# Patient Record
Sex: Female | Born: 1955
Health system: Southern US, Community
[De-identification: ages and names within clinical notes are randomized; demographics above are authoritative.]

## PROBLEM LIST (undated history)

## (undated) DIAGNOSIS — Z8619 Personal history of other infectious and parasitic diseases: Secondary | ICD-10-CM

## (undated) DIAGNOSIS — J45909 Unspecified asthma, uncomplicated: Secondary | ICD-10-CM

## (undated) DIAGNOSIS — L97519 Non-pressure chronic ulcer of other part of right foot with unspecified severity: Secondary | ICD-10-CM

## (undated) DIAGNOSIS — E1140 Type 2 diabetes mellitus with diabetic neuropathy, unspecified: Secondary | ICD-10-CM

## (undated) DIAGNOSIS — F1911 Other psychoactive substance abuse, in remission: Secondary | ICD-10-CM

## (undated) DIAGNOSIS — F32A Depression, unspecified: Secondary | ICD-10-CM

## (undated) DIAGNOSIS — N3941 Urge incontinence: Secondary | ICD-10-CM

## (undated) DIAGNOSIS — I10 Essential (primary) hypertension: Secondary | ICD-10-CM

## (undated) DIAGNOSIS — Z8709 Personal history of other diseases of the respiratory system: Secondary | ICD-10-CM

## (undated) DIAGNOSIS — Z9109 Other allergy status, other than to drugs and biological substances: Secondary | ICD-10-CM

## (undated) DIAGNOSIS — R600 Localized edema: Secondary | ICD-10-CM

## (undated) DIAGNOSIS — E119 Type 2 diabetes mellitus without complications: Secondary | ICD-10-CM

## (undated) DIAGNOSIS — R350 Frequency of micturition: Secondary | ICD-10-CM

## (undated) DIAGNOSIS — M779 Enthesopathy, unspecified: Secondary | ICD-10-CM

## (undated) DIAGNOSIS — F419 Anxiety disorder, unspecified: Secondary | ICD-10-CM

## (undated) DIAGNOSIS — F329 Major depressive disorder, single episode, unspecified: Secondary | ICD-10-CM

## (undated) DIAGNOSIS — B182 Chronic viral hepatitis C: Secondary | ICD-10-CM

## (undated) DIAGNOSIS — M797 Fibromyalgia: Secondary | ICD-10-CM

## (undated) DIAGNOSIS — J309 Allergic rhinitis, unspecified: Secondary | ICD-10-CM

## (undated) DIAGNOSIS — N183 Chronic kidney disease, stage 3 unspecified: Secondary | ICD-10-CM

## (undated) DIAGNOSIS — G8929 Other chronic pain: Secondary | ICD-10-CM

## (undated) DIAGNOSIS — I739 Peripheral vascular disease, unspecified: Secondary | ICD-10-CM

## (undated) DIAGNOSIS — M1990 Unspecified osteoarthritis, unspecified site: Secondary | ICD-10-CM

## (undated) DIAGNOSIS — M549 Dorsalgia, unspecified: Secondary | ICD-10-CM

## (undated) DIAGNOSIS — I2510 Atherosclerotic heart disease of native coronary artery without angina pectoris: Secondary | ICD-10-CM

## (undated) DIAGNOSIS — R351 Nocturia: Secondary | ICD-10-CM

## (undated) DIAGNOSIS — F1120 Opioid dependence, uncomplicated: Secondary | ICD-10-CM

## (undated) HISTORY — DX: Chronic viral hepatitis C: B18.2

---
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
## 2005-05-27 ENCOUNTER — Ambulatory Visit: Payer: Self-pay | Admitting: Internal Medicine

## 2005-05-28 ENCOUNTER — Ambulatory Visit: Payer: Self-pay | Admitting: *Deleted

## 2005-07-10 ENCOUNTER — Ambulatory Visit: Payer: Self-pay | Admitting: Internal Medicine

## 2005-10-09 ENCOUNTER — Emergency Department (HOSPITAL_COMMUNITY): Admission: EM | Admit: 2005-10-09 | Discharge: 2005-10-09 | Payer: Self-pay | Admitting: Emergency Medicine

## 2005-10-15 ENCOUNTER — Emergency Department (HOSPITAL_COMMUNITY): Admission: EM | Admit: 2005-10-15 | Discharge: 2005-10-15 | Payer: Self-pay | Admitting: Emergency Medicine

## 2006-08-11 ENCOUNTER — Ambulatory Visit: Payer: Self-pay | Admitting: Internal Medicine

## 2006-09-11 ENCOUNTER — Ambulatory Visit: Payer: Self-pay | Admitting: Internal Medicine

## 2006-12-16 ENCOUNTER — Encounter (INDEPENDENT_AMBULATORY_CARE_PROVIDER_SITE_OTHER): Payer: Self-pay | Admitting: *Deleted

## 2007-06-30 ENCOUNTER — Ambulatory Visit: Payer: Self-pay | Admitting: Internal Medicine

## 2007-09-09 ENCOUNTER — Ambulatory Visit: Payer: Self-pay | Admitting: Internal Medicine

## 2007-09-09 LAB — CONVERTED CEMR LAB
ALT: 18 units/L (ref 0–35)
AST: 28 units/L (ref 0–37)
Albumin: 3.8 g/dL (ref 3.5–5.2)
Alkaline Phosphatase: 68 units/L (ref 39–117)
BUN: 11 mg/dL (ref 6–23)
Basophils Absolute: 0 10*3/uL (ref 0.0–0.1)
Basophils Relative: 0 % (ref 0–1)
CO2: 28 meq/L (ref 19–32)
Calcium: 9.5 mg/dL (ref 8.4–10.5)
Chloride: 98 meq/L (ref 96–112)
Creatinine, Ser: 0.98 mg/dL (ref 0.40–1.20)
Eosinophils Absolute: 0.2 10*3/uL (ref 0.0–0.7)
Eosinophils Relative: 1 % (ref 0–5)
Glucose, Bld: 149 mg/dL — ABNORMAL HIGH (ref 70–99)
HCT: 41.7 % (ref 36.0–46.0)
HCV Ab: REACTIVE — AB
Hemoglobin: 13.3 g/dL (ref 12.0–15.0)
Hep A Total Ab: NEGATIVE
Hep B Core Total Ab: POSITIVE — AB
Hep B E Ab: POSITIVE — AB
Hep B S Ab: NEGATIVE
Hepatitis B Surface Ag: NEGATIVE
Lymphocytes Relative: 39 % (ref 12–46)
Lymphs Abs: 4.2 10*3/uL — ABNORMAL HIGH (ref 0.7–4.0)
MCHC: 31.9 g/dL (ref 30.0–36.0)
MCV: 85.1 fL (ref 78.0–100.0)
Monocytes Absolute: 0.7 10*3/uL (ref 0.1–1.0)
Monocytes Relative: 6 % (ref 3–12)
Neutro Abs: 5.8 10*3/uL (ref 1.7–7.7)
Neutrophils Relative %: 53 % (ref 43–77)
Platelets: 225 10*3/uL (ref 150–400)
Potassium: 4.1 meq/L (ref 3.5–5.3)
RBC: 4.9 M/uL (ref 3.87–5.11)
RDW: 12.6 % (ref 11.5–15.5)
Sodium: 138 meq/L (ref 135–145)
TSH: 2.169 microintl units/mL (ref 0.350–5.50)
Total Bilirubin: 0.6 mg/dL (ref 0.3–1.2)
Total Protein: 8.1 g/dL (ref 6.0–8.3)
WBC: 10.9 10*3/uL — ABNORMAL HIGH (ref 4.0–10.5)

## 2007-09-10 ENCOUNTER — Ambulatory Visit (HOSPITAL_COMMUNITY): Admission: RE | Admit: 2007-09-10 | Discharge: 2007-09-10 | Payer: Self-pay | Admitting: Internal Medicine

## 2008-02-23 ENCOUNTER — Ambulatory Visit: Payer: Self-pay | Admitting: Internal Medicine

## 2008-03-10 ENCOUNTER — Ambulatory Visit: Payer: Self-pay | Admitting: Internal Medicine

## 2008-03-16 ENCOUNTER — Ambulatory Visit: Payer: Self-pay | Admitting: Internal Medicine

## 2008-03-21 ENCOUNTER — Emergency Department (HOSPITAL_COMMUNITY): Admission: EM | Admit: 2008-03-21 | Discharge: 2008-03-21 | Payer: Self-pay | Admitting: Emergency Medicine

## 2008-04-04 ENCOUNTER — Encounter (HOSPITAL_BASED_OUTPATIENT_CLINIC_OR_DEPARTMENT_OTHER): Admission: RE | Admit: 2008-04-04 | Discharge: 2008-07-03 | Payer: Self-pay | Admitting: Internal Medicine

## 2008-04-05 ENCOUNTER — Encounter (HOSPITAL_BASED_OUTPATIENT_CLINIC_OR_DEPARTMENT_OTHER): Payer: Self-pay | Admitting: Internal Medicine

## 2008-04-05 ENCOUNTER — Ambulatory Visit: Admission: RE | Admit: 2008-04-05 | Discharge: 2008-04-05 | Payer: Self-pay | Admitting: Internal Medicine

## 2008-04-05 ENCOUNTER — Ambulatory Visit: Payer: Self-pay | Admitting: Vascular Surgery

## 2008-04-06 ENCOUNTER — Ambulatory Visit: Payer: Self-pay | Admitting: Internal Medicine

## 2008-06-22 ENCOUNTER — Ambulatory Visit: Payer: Self-pay | Admitting: Internal Medicine

## 2008-06-28 ENCOUNTER — Encounter (INDEPENDENT_AMBULATORY_CARE_PROVIDER_SITE_OTHER): Payer: Self-pay | Admitting: *Deleted

## 2008-07-13 ENCOUNTER — Encounter (HOSPITAL_BASED_OUTPATIENT_CLINIC_OR_DEPARTMENT_OTHER): Admission: RE | Admit: 2008-07-13 | Discharge: 2008-08-01 | Payer: Self-pay | Admitting: Internal Medicine

## 2008-07-27 ENCOUNTER — Ambulatory Visit: Payer: Self-pay | Admitting: Internal Medicine

## 2008-08-03 ENCOUNTER — Ambulatory Visit (HOSPITAL_COMMUNITY): Admission: RE | Admit: 2008-08-03 | Discharge: 2008-08-03 | Payer: Self-pay | Admitting: Family Medicine

## 2008-09-28 ENCOUNTER — Ambulatory Visit: Payer: Self-pay | Admitting: Internal Medicine

## 2008-09-28 ENCOUNTER — Encounter: Payer: Self-pay | Admitting: Internal Medicine

## 2008-10-19 ENCOUNTER — Encounter (HOSPITAL_BASED_OUTPATIENT_CLINIC_OR_DEPARTMENT_OTHER): Admission: RE | Admit: 2008-10-19 | Discharge: 2009-01-17 | Payer: Self-pay | Admitting: Internal Medicine

## 2008-10-30 ENCOUNTER — Encounter (HOSPITAL_BASED_OUTPATIENT_CLINIC_OR_DEPARTMENT_OTHER): Payer: Self-pay | Admitting: General Surgery

## 2008-10-30 ENCOUNTER — Ambulatory Visit: Payer: Self-pay | Admitting: Vascular Surgery

## 2008-10-30 ENCOUNTER — Ambulatory Visit: Admission: RE | Admit: 2008-10-30 | Discharge: 2008-10-30 | Payer: Self-pay | Admitting: General Surgery

## 2008-11-28 ENCOUNTER — Ambulatory Visit: Payer: Self-pay | Admitting: Internal Medicine

## 2008-11-28 LAB — CONVERTED CEMR LAB: Microalb, Ur: 0.5 mg/dL (ref 0.00–1.89)

## 2008-12-08 ENCOUNTER — Ambulatory Visit (HOSPITAL_COMMUNITY): Admission: RE | Admit: 2008-12-08 | Discharge: 2008-12-08 | Payer: Self-pay | Admitting: Internal Medicine

## 2008-12-26 ENCOUNTER — Ambulatory Visit: Payer: Self-pay | Admitting: Internal Medicine

## 2009-01-24 ENCOUNTER — Encounter (HOSPITAL_BASED_OUTPATIENT_CLINIC_OR_DEPARTMENT_OTHER): Admission: RE | Admit: 2009-01-24 | Discharge: 2009-04-02 | Payer: Self-pay | Admitting: Internal Medicine

## 2009-03-14 ENCOUNTER — Ambulatory Visit: Payer: Self-pay | Admitting: Internal Medicine

## 2009-04-02 ENCOUNTER — Ambulatory Visit: Payer: Self-pay | Admitting: Internal Medicine

## 2009-04-02 LAB — CONVERTED CEMR LAB
BUN: 14 mg/dL (ref 6–23)
CO2: 22 meq/L (ref 19–32)
Calcium: 8.4 mg/dL (ref 8.4–10.5)
Chloride: 98 meq/L (ref 96–112)
Creatinine, Ser: 0.94 mg/dL (ref 0.40–1.20)
Glucose, Bld: 174 mg/dL — ABNORMAL HIGH (ref 70–99)
Potassium: 3.5 meq/L (ref 3.5–5.3)
Sodium: 134 meq/L — ABNORMAL LOW (ref 135–145)

## 2009-04-03 ENCOUNTER — Encounter (HOSPITAL_BASED_OUTPATIENT_CLINIC_OR_DEPARTMENT_OTHER): Admission: RE | Admit: 2009-04-03 | Discharge: 2009-06-18 | Payer: Self-pay | Admitting: Internal Medicine

## 2009-05-11 ENCOUNTER — Ambulatory Visit: Payer: Self-pay | Admitting: Internal Medicine

## 2009-07-16 ENCOUNTER — Encounter (HOSPITAL_BASED_OUTPATIENT_CLINIC_OR_DEPARTMENT_OTHER): Admission: RE | Admit: 2009-07-16 | Discharge: 2009-08-22 | Payer: Self-pay | Admitting: Internal Medicine

## 2009-07-24 ENCOUNTER — Ambulatory Visit: Payer: Self-pay | Admitting: Internal Medicine

## 2009-11-02 ENCOUNTER — Ambulatory Visit: Payer: Self-pay | Admitting: Internal Medicine

## 2009-12-06 ENCOUNTER — Encounter (INDEPENDENT_AMBULATORY_CARE_PROVIDER_SITE_OTHER): Payer: Self-pay | Admitting: *Deleted

## 2010-02-22 ENCOUNTER — Emergency Department (HOSPITAL_COMMUNITY): Admission: EM | Admit: 2010-02-22 | Discharge: 2010-02-22 | Payer: Self-pay | Admitting: Emergency Medicine

## 2010-03-08 ENCOUNTER — Ambulatory Visit (HOSPITAL_COMMUNITY): Admission: RE | Admit: 2010-03-08 | Discharge: 2010-03-08 | Payer: Self-pay | Source: Home / Self Care

## 2010-04-23 ENCOUNTER — Ambulatory Visit (HOSPITAL_COMMUNITY)
Admission: RE | Admit: 2010-04-23 | Discharge: 2010-04-23 | Payer: Medicare Other | Source: Home / Self Care | Attending: Internal Medicine | Admitting: Internal Medicine

## 2010-04-30 NOTE — Letter (Signed)
Summary: Generic Letter  [facility]  [address] [place], [place] [address]   Phone: [phone]  Fax: [phone]        12/06/2009  To: SSI  Re: Rose [name] Pro is a current patient at [facility]. She is stable and able to manage her own benefit payment.   Sincerely,   Adair Patter

## 2010-04-30 NOTE — Letter (Signed)
Summary: Generic Letter  [facility]  [address] [place], [place] [address]   Phone: [phone]  Fax: [phone]    12/06/2009  [facility] *INCORRECT [address] [place], [place]  [address]  Dear Ms. Redinger,           Sincerely,   Gaylyn Cheers RN

## 2010-04-30 NOTE — Miscellaneous (Signed)
Summary: VIP  Patient: Rose [name] Note: All result statuses are Final unless otherwise noted.  Tests: (1) VIP (Medications)   LLIMPORTMEDS              "Result Below..."       RESULT: VENTOLIN HFA AERS 108 (90 Base) MCG/ACT*USE 2 PUFFS EVERY 4-6 HOURS AS NEEDED*08/11/2006*Last Refill: ZOXWRUE*[id number]*******   LLIMPORTMEDS              "Result Below..."       RESULT: HYDROCHLOROTHIAZIDE TABS 25 MG*TAKE ONE (1) TABLET BY MOUTH EVERY DAY*11/09/2006*Last Refill: 12/31/2006*[id number]*******   LLIMPORTMEDS              "Result Below..."       RESULT: GLUCOTROL XL TB24 5 MG*TAKE ONE (1) TABLET BY MOUTH EVERY DAY*11/09/2006*Last Refill: 12/31/2006*[id number]*******   LLIMPORTMEDS              "Result Below..."       RESULT: ALLEGRA TABS 180 MG*TAKE ONE (1) TABLET BY MOUTH EVERY DAY*11/09/2006*Last Refill: 12/31/2006*[id number]*******   LLIMPORTMEDS              "Result Below..."       RESULT: ADVAIR DISKUS MISC 100-50 MCG/DOSE*INHALE ONE (1) PUFF TWICE DAILY*01/26/2007*Last Refill: Unknown*[id number]*******   LLIMPORTALLS              NKDA***  Note: An exclamation mark (!) indicates a result that was not dispersed into the flowsheet. Document Creation Date: 01/28/2007 3:02 PM _______________________________________________________________________  (1) Order result status: Final Collection or observation date-time: 12/16/2006 Requested date-time: 12/16/2006 Receipt date-time:  Reported date-time: 12/16/2006 Referring Physician:   Ordering Physician:   Specimen Source:  Source: [facility] [place] Order Number:  Lab site:

## 2010-05-09 ENCOUNTER — Encounter (INDEPENDENT_AMBULATORY_CARE_PROVIDER_SITE_OTHER): Payer: Self-pay | Admitting: Family Medicine

## 2010-05-09 LAB — CONVERTED CEMR LAB
ALT: 19 units/L (ref 0–35)
AST: 27 units/L (ref 0–37)
Albumin: 4.2 g/dL (ref 3.5–5.2)
Alkaline Phosphatase: 78 units/L (ref 39–117)
BUN: 14 mg/dL (ref 6–23)
CO2: 28 meq/L (ref 19–32)
Calcium: 9 mg/dL (ref 8.4–10.5)
Chloride: 99 meq/L (ref 96–112)
Creatinine, Ser: 1.01 mg/dL (ref 0.40–1.20)
Glucose, Bld: 91 mg/dL (ref 70–99)
Potassium: 4.1 meq/L (ref 3.5–5.3)
Sodium: 137 meq/L (ref 135–145)
Total Bilirubin: 0.6 mg/dL (ref 0.3–1.2)
Total Protein: 7.8 g/dL (ref 6.0–8.3)

## 2010-06-27 ENCOUNTER — Other Ambulatory Visit: Payer: Self-pay | Admitting: Podiatry

## 2010-06-28 ENCOUNTER — Emergency Department (HOSPITAL_COMMUNITY)
Admission: EM | Admit: 2010-06-28 | Discharge: 2010-06-28 | Disposition: A | Discharge status: Home / Self Care | Payer: Medicare Other | Attending: Emergency Medicine | Admitting: Emergency Medicine

## 2010-06-28 DIAGNOSIS — Z794 Long term (current) use of insulin: Secondary | ICD-10-CM | POA: Insufficient documentation

## 2010-06-28 DIAGNOSIS — E119 Type 2 diabetes mellitus without complications: Secondary | ICD-10-CM | POA: Insufficient documentation

## 2010-06-28 DIAGNOSIS — Z79899 Other long term (current) drug therapy: Secondary | ICD-10-CM | POA: Insufficient documentation

## 2010-06-28 DIAGNOSIS — Z7982 Long term (current) use of aspirin: Secondary | ICD-10-CM | POA: Insufficient documentation

## 2010-06-28 DIAGNOSIS — E669 Obesity, unspecified: Secondary | ICD-10-CM | POA: Insufficient documentation

## 2010-06-28 DIAGNOSIS — I10 Essential (primary) hypertension: Secondary | ICD-10-CM | POA: Insufficient documentation

## 2010-06-28 DIAGNOSIS — R259 Unspecified abnormal involuntary movements: Secondary | ICD-10-CM | POA: Insufficient documentation

## 2010-06-28 LAB — URINALYSIS, ROUTINE W REFLEX MICROSCOPIC
Bilirubin Urine: NEGATIVE
Glucose, UA: 100 mg/dL — AB
Hgb urine dipstick: NEGATIVE
Ketones, ur: NEGATIVE mg/dL
Nitrite: NEGATIVE
Protein, ur: NEGATIVE mg/dL
Specific Gravity, Urine: 1.01 (ref 1.005–1.030)
Urobilinogen, UA: 1 mg/dL (ref 0.0–1.0)
pH: 5.5 (ref 5.0–8.0)

## 2010-06-28 LAB — POCT I-STAT, CHEM 8
BUN: 26 mg/dL — ABNORMAL HIGH (ref 6–23)
Calcium, Ion: 1.04 mmol/L — ABNORMAL LOW (ref 1.12–1.32)
Chloride: 95 mEq/L — ABNORMAL LOW (ref 96–112)
Creatinine, Ser: 1.3 mg/dL — ABNORMAL HIGH (ref 0.4–1.2)
Glucose, Bld: 262 mg/dL — ABNORMAL HIGH (ref 70–99)
HCT: 42 % (ref 36.0–46.0)
Hemoglobin: 14.3 g/dL (ref 12.0–15.0)
Potassium: 3.8 mEq/L (ref 3.5–5.1)
Sodium: 130 mEq/L — ABNORMAL LOW (ref 135–145)
TCO2: 28 mmol/L (ref 0–100)

## 2010-06-28 LAB — COMPREHENSIVE METABOLIC PANEL
ALT: 19 U/L (ref 0–35)
AST: 27 U/L (ref 0–37)
Albumin: 3.3 g/dL — ABNORMAL LOW (ref 3.5–5.2)
Alkaline Phosphatase: 69 U/L (ref 39–117)
BUN: 20 mg/dL (ref 6–23)
CO2: 28 mEq/L (ref 19–32)
Calcium: 9.1 mg/dL (ref 8.4–10.5)
Chloride: 95 mEq/L — ABNORMAL LOW (ref 96–112)
Creatinine, Ser: 1.16 mg/dL (ref 0.4–1.2)
GFR calc Af Amer: 59 mL/min — ABNORMAL LOW (ref >60)
GFR calc non Af Amer: 49 mL/min — ABNORMAL LOW (ref >60)
Glucose, Bld: 240 mg/dL — ABNORMAL HIGH (ref 70–99)
Potassium: 4 mEq/L (ref 3.5–5.1)
Sodium: 132 mEq/L — ABNORMAL LOW (ref 135–145)
Total Bilirubin: 0.4 mg/dL (ref 0.3–1.2)
Total Protein: 7.6 g/dL (ref 6.0–8.3)

## 2010-08-13 NOTE — Assessment & Plan Note (Signed)
[facility]   Rose [name], Rose [name]             ACCOUNT NO.:  [id number]   MEDICAL RECORD NO.:  [id number]      DATE OF BIRTH:  December 03, 1955   PHYSICIAN:  Leonie Man, M.D.    VISIT DATE:  10/23/2008                                   OFFICE VISIT   This is a repeat wound care consultation.  The patient is a 55 year old  black female with a history of diabetes mellitus.  She attends the  [facility].  She was last seen here at [facility]  in April of 2010 when she was felt to be healed from her bilateral  lateral leg ulcers.  The patient was discharged on compression sock,  stockings for what we felt to be venous stasis disease.  She returns  today for a new consultation and evaluation with bilateral leg ulcers in  the bed of her previous ulcers.  The patient says that she has been  wearing her compression stockings over this period of time.  However,  her legs bilaterally continue to be swollen.  The patient does admit to  smoking approximately one half pack of cigarettes a day.   PAST MEDICAL HISTORY:  Appendectomy in 1992.   CURRENT MEDICATIONS:  Unchanged from her last fillings and these include  hydrochlorothiazide 25 mg daily, Glucotrol, Skelaxin, Advair Diskus,  aspirin 81 mg, furosemide 20 mg daily, metformin 500 mg daily, Paxil,  Ventolin, and Xyzal orally.   ALLERGIES:  The patient denies any medication allergies.   PAST MEDICAL HISTORY:  Diabetes mellitus type 2, chronic bronchitis with  COPD.   REVIEW OF SYSTEMS:  Negative in detail except as outlined above.   PHYSICAL EXAMINATION:  VITAL SIGNS:  The patient's temperature is 98.4,  pulse 81, respirations 19, and blood pressure 121/72.  Capillary blood  glucose is 92 on today's evaluation.  HEENT:  Poor dentition with multiple missing molars.  Oropharynx  otherwise shows moist mucosa.  NECK:  Supple.  No thyromegaly or adenopathy.  There is no scleral  icterus.  CHEST:   Coarse breath sounds consistent with a chronic bronchitis.  HEART:  Regular rate and rhythm.  There no murmurs or gallop rhythms  heard.  ABDOMEN:  Protuberant, obese, nontender, and nondistended without  palpable masses or visceromegaly.  EXTREMITIES:  Bilateral leg edema with bilateral lateral leg stasis  ulcers on the posterior portion of the right lower extremity and also  measuring 7.0 x 2.5 x 0.2 and on the left lower extremity  posterolaterally there is 3.8 x 1.6 x 0.2 ulcer.  There is no obvious  drainage.  There is no odor.   TREATMENT PLAN:  Cultures followed by selective debridement of both  ulcers, Santyl damp to dry dressings, and Kerlix and Coban with these  dressing to be changed every 3 days to the assistance of the Visiting  Nurse Service.   I have asked the patient to stop smoking which she promises to do.  We  will go ahead and order a new laboratory panel on her to include CBC,  sed rate, complete metabolic panel, hemoglobin A1c, and prealbumin.  Because her  pulses were nonpalpable, we will go ahead and get transcutaneous pO2s  and proximity to the wounds with  additional reference lead on the chest.  We will also get arterial and venous Doppler studies bilaterally.   We will follow up with this patient in 1 week.      Leonie Man, M.D.  Electronically Signed     PB/MEDQ  D:  10/23/2008  T:  10/24/2008  Job:  [id number]

## 2010-08-13 NOTE — Assessment & Plan Note (Signed)
[facility]   NAMECARLICIA, LEAVENS             ACCOUNT NO.:  [id number]   MEDICAL RECORD NO.:  [id number]      DATE OF BIRTH:  04-10-1955   PHYSICIAN:  Leonie Man, M.D.    VISIT DATE:  10/30/2008                                   OFFICE VISIT   PROBLEM:  Recurrent venous stasis disease.   SUBJ  55 year old female last seen on October 23, 2008, and at that time, treated  with Santyl wet-to-dry dressings with Kerlix and Coban over rather  necrotic ulcers.  The dressings were changed every other day and the  patient returns now for re-evaluation.   PHYSICAL EXAMINATION:  Temperature 98.1, pulse 71, respirations 16,  blood pressure 118/76.  Capillary blood glucose is 93.  The periwound tissues appear quite normal without any erythema or edema.  There is no odor or drainage.  The wound edges are clean and the wound  base is clean and granulating except for small areas of necrotic debris.   ASSESSMENT:  Improved healing of venous leg ulcers in this patient with  venous hypertension of the extremities bilaterally.   TREATMENT:  Today, we will continue her on Santyl on those areas of  necrosis with wet-to-dry dressings on the granulating surfaces and  Kerlix and Coban dressing changes every other day.   DISP  Follow up will be in 1 week.      Leonie Man, M.D.  Electronically Signed     PB/MEDQ  D:  10/30/2008  T:  10/31/2008  Job:  [id number]

## 2010-08-13 NOTE — Assessment & Plan Note (Signed)
[facility]   NAMEMAGON, CROSON             ACCOUNT NO.:  [id number]   MEDICAL RECORD NO.:  [id number]      DATE OF BIRTH:  01/15/1956   PHYSICIAN:  Leonie Man, M.D.         VISIT DATE:                                   OFFICE VISIT   PROBLEM:  Bilateral venous stasis disease.   HISTORY:  The patient is a 55 year old female with stasis ulcerations on  the lateral aspects of both legs, located midway up the calf.  She has  been treated with Profore wraps with Prisma and dry pads weekly.   On examination today, the current wound on the right leg is 3 x 3.1 x  0.1.  The left leg wound is not completely healed, not fully  epithelialized.  There is no odor to the wound.  There is some moderate  drainage.   The patient is feeling well without any complaints with respect to her  legs.  Her edema is now down completely.  Her capillary return is  excellent, and pulses are easily palpable.   VITAL SIGNS:  Today, temperature 98.3, pulse 80, respirations 16, and  blood pressure 133/82.  Her most recent CBG taken today was 93.   TREATMENT:  The right leg wound is selectively debrided using a knife  debridement.  This is then covered with a Prisma dry pad and a Profore  Lite and also applied a Profore Lite to the left lower extremity, and we  will see her back again in 1 week.      Leonie Man, M.D.  Electronically Signed     PB/MEDQ  D:  05/24/2008  T:  05/24/2008  Job:  [id number]

## 2010-08-13 NOTE — Assessment & Plan Note (Signed)
[facility]   Rose [name], Rose [name]             ACCOUNT NO.:  [id number]   MEDICAL RECORD NO.:  [id number]      DATE OF BIRTH:  1955/06/25   PHYSICIAN:  Leonie Man, M.D.    VISIT DATE:  05/10/2008                                   OFFICE VISIT   PROBLEM:  Bilateral venous stasis disease, currently doing well with the  wounds closing as expected on her current therapy of Prisma and Profore  wraps.   HISTORY:  The patient is a 55 year old woman with a past history of drug  abuse, currently on a methadone program.  I got a call from her nurse in  program today.  She informed me that the patient has been injecting  cocaine into her legs and under her tongue.  There was some significant  concern about this because the patient had a history of DVT in the past.  On talking with the patient, the patient had not been on any  anticoagulant therapy for her DVT that she can recall.   On examination of her legs, the edema is down.  The left leg is now  fully epithelialized and would just simply need some continued  compression over the next week or so.  On the right side, the wound is  fully granulated with some increasing epithelialization.  The current  wound measures 3.5 x 1.8 x 0.2 cm.  There is very slight amount of  fibrinous exudate.  This is selectively debrided away using a scalpel.  There is no drainage or odor.  There is no surrounding erythema.  There  is no undermining.   Today's treatment, the left leg will be wrapped in a Profore wrap.  The  right leg will have a Prisma placed on it to speed up epithelialization  and cover with Profore wrap.  I had a fairly long talk with the patient  asking her to be careful and not to inject any substances in her leg as  this may cause an infection and recurrent DVT.  I have asked her to go  ahead and make arrangements as per the request of her nurse to going to  inpatient rehabilitation center, so as to try to  modify her behavior.  I  do not think that she would be ready to have this to go in for another 2-  3 weeks until her legs healed up, but she is giving me a promise that  she will stop injecting drugs into her legs and under her tongue.   Follow up with her will be in 1 week.      Leonie Man, M.D.  Electronically Signed     PB/MEDQ  D:  05/10/2008  T:  05/11/2008  Job:  [id number]

## 2010-08-13 NOTE — Assessment & Plan Note (Signed)
[facility]   Rose [name], Rose [name]             ACCOUNT NO.:  [id number]   MEDICAL RECORD NO.:  [id number]      DATE OF BIRTH:  Jan 09, 1956   PHYSICIAN:  Jonelle Sports. Sevier, M.D.  VISIT DATE:  05/31/2008                                   OFFICE VISIT   HISTORY:  This 55 year old black female is followed for bilateral stasis  ulcerations of the lower extremities, occurring on the lateral aspects  of the leg.  That on the left had previously healed, and while she was  attempting to obtain compression hose, we have kept that extremity in a  Profore Lite wrap in hopes of preventing recurrence.   She persists with ulceration on the lateral right lower extremity, but  this is making progressive improvement.  She reports no problems with  her wrap there.  No drainage, no odor, no fever, or no other problems,  whatsoever.  She is free of pain.   EXAMINATION:  Blood pressure 129/78, pulse 83, respirations 18,  temperature 98.3.  Random blood glucose 93 mg/dL.  The ulcer on the lateral right lower extremity now measures 2.0 x 0.5 x  0.2 cm and has a fairly clean base.  (We had previously been using some  Prisma dressing on this).  There is one small crust at the distal  portion of the wound and some surrounding accumulation of Prisma around  the wound where it is overlapped.   IMPRESSION:  Progressive improvement in stasis ulceration, right lower  extremity.   DISPOSITION:  The wound is debrided under 5% lidocaine ointment coverage  to remove the aforementioned crust, some of the remaining dressing  material (Prisma) which is outside the margins of the wound itself, and  the wound margins are stimulated with light selective debridement as  well.  This is well tolerated.   The wound will then be dressed with an application of Silverlon,  hydrogel, and a Profore Lite wrap.   The left lower extremity will likewise be placed in a Profore Lite wrap,  and she is  encouraged to continue her efforts at obtaining compression  stockings to be used once these wounds are healed.   Followup visit will be here to the nurse in 1 week, to the physician in  2 weeks.           ______________________________  Jonelle Sports Cheryll Cockayne, M.D.     RES/MEDQ  D:  05/31/2008  T:  05/31/2008  Job:  [id number]

## 2010-08-13 NOTE — Assessment & Plan Note (Signed)
[facility]   Rose [name], Rose [name]             ACCOUNT NO.:  [id number]   MEDICAL RECORD NO.:  [id number]      DATE OF BIRTH:  1955-06-26   PHYSICIAN:  Leonie Man, M.D.    VISIT DATE:  07/03/2008                                   OFFICE VISIT   PROBLEM:  Venous stasis disease with venous stasis ulcer of right  lateral leg.   HISTORY:  The patient is a 55 year old female followed for bilateral  stasis ulcerations for the past several weeks.  She has been having a  large stasis ulcer on the lateral side of her right leg.  This has shown  a progressive improvement under Profore dressings.   On the most recent visit, the patient was allowed to remove her  stockings yesterday which was Easter Sunday so she could wear stockings  to church and she returns today for followup.   PHYSICAL EXAMINATION:  The patient is feeling well except that she  complains of some neuropathic pain in the soles of her feet.  She is  afebrile.  Temperature 98.2, pulse 81, respirations 20, blood pressure  134/84.   The wound on the right lateral leg measures 1.0 x 0.8 x 0.1.  The  remainder of the wound is now very well epithelialized although it has  not been re-pigmented in this African American female.  The granulation  area is pink and requires some very minimal selective debridement today  which was done so as to remove slough and skin from around the wound.  I  will dress her wound with hydrogel and replace the Profore dressing   DISP/  Will follow up with her in approximately 10 days.      Leonie Man, M.D.  Electronically Signed     PB/MEDQ  D:  07/03/2008  T:  07/03/2008  Job:  [id number]

## 2010-08-13 NOTE — Assessment & Plan Note (Signed)
[facility]   NAMESHEILA, Rose [name]             ACCOUNT NO.:  [id number]   MEDICAL RECORD NO.:  [id number]      DATE OF BIRTH:  [birth year]   PHYSICIAN:  Jonelle Sports. Sevier, M.D.  VISIT DATE:  05/17/2008                                   OFFICE VISIT   HISTORY:  This 55 year old black female was seen for unusually located  stasis ulcerations on the lateral aspects of both lower extremities.  She has been treated with conventional manner with compressive wraps and  indeed today feels as if her left lower extremity has completely healed.  She has had no pains, no odor, no drainage, no fever, or systemic  symptoms.   PHYSICAL EXAMINATION:  VITAL SIGNS:  Blood pressure 153/89, pulse 96,  respirations 18, temperature 98.8, random blood glucose 90 mg/dL this  morning.  EXTREMITIES:  The wound on the left lateral lower extremity is indeed  completely healed with good epithelialization and healthy surrounding  tissue now.  The wound on the lateral aspect of the right lower  extremity still measures 1.7 x 0.8 x 0.2 cm and has a nice granular base  with some evidence of epithelialization progressing from the  anteromedial aspect of the wound.  There is considerable loose skin at  some of the wound margins and also some crusting from residual from  Prisma, with which she has been dressed.   IMPRESSION:  Resolution of stasis ulcer, right lower extremity.  Continued progress, stasis ulceration left lower extremity.   DISPOSITION:  The wound is sharply debrided under 5% lidocaine.  Protection of the bits of loose skin and some of the remnants of old  dressings and drainage.  It cleans up nicely and again shows healthy  margins.  It is then dressed with a reapplication of Prisma covered by a  protective pad and both extremities were then returned to Profore light  wraps.   The need for compression hose is discussed with the patient and it may  well be that cost will be a  factor and we may have to see if we can  somewhere find some assistance for her in that regard.   Her followup visit will be here in 1 week.           ______________________________  Jonelle Sports. Cheryll Cockayne, M.D.     RES/MEDQ  D:  05/17/2008  T:  05/17/2008  Job:  [id number]

## 2010-08-13 NOTE — Assessment & Plan Note (Signed)
[facility]   NAMETAKAKO, MINCKLER             ACCOUNT NO.:  [id number]   MEDICAL RECORD NO.:  [id number]      DATE OF BIRTH:  Mar 03, 1956   PHYSICIAN:  Jonelle Sports. Sevier, M.D.  VISIT DATE:  04/12/2008                                   OFFICE VISIT   HISTORY:  This 55 year old black female was seen for the first time last  week for management of 2 large stasis ulcerations in the lateral aspects  of both extremities.  These having occurred in areas where she has had  previous ulcerations and has very abnormal skin.  They were treated last  week with some debridement and then with an application of Prisma and  compressive wraps.   She reports that for the first day or two after manipulation, they were  somewhat uncomfortable but quickly went away and she really has had an  uneventful week since that time.  Most specifically, she has had no  odor, no fever, no chills, or other systemic symptoms.   PHYSICAL EXAMINATION:  VITAL SIGNS:  Blood pressure is 132/79, pulse 97,  respirations 20, and temperature 98.3.  Capillary blood glucose obtained  by the patient this morning was 77 mg/dL.   The ulcer on the lateral aspect of the left lower extremity now measures  3.1 x 1.1 x 0.2 cm, and has the appearance of advancing epithelium in  its margins.  There are some crusts proximally in this wound, right at  its margin.   The wound on the right lateral lower extremity measures 7.5 x 2.6 x 0.2  cm, has some undermining at the 11-1 o'clock position and some small  crusts at the margins adjacent to this.   Both wound bases look quite granular and satisfactory at this point.   IMPRESSION:  Improvement of stasis ulcerations, bilateral lower  extremities.   DISPOSITION:  The crusty areas were debrided from both wounds and the  small undermined area on the left lower extremity wound was debrided by  the selective removal of skin layer as well.  These are well tolerated.  Please see the debridement sheet.   The wounds were then dressed with the reapplication of Prisma  bilaterally, covered with absorptive pad, and she is placed in bilateral  Profore wraps.   Followup visit will be here in 1 week.           ______________________________  Jonelle Sports. Cheryll Cockayne, M.D.     RES/MEDQ  D:  04/12/2008  T:  04/12/2008  Job:  [id number]

## 2010-08-13 NOTE — Assessment & Plan Note (Signed)
[facility]   Rose [name], Rose [name]             ACCOUNT NO.:  [id number]   MEDICAL RECORD NO.:  [id number]      DATE OF BIRTH:  1955-08-04   PHYSICIAN:  Leonie Man, M.D.    VISIT DATE:  11/20/2008                                   OFFICE VISIT   PROBLEM:  Recurrent venous stasis ulcers located in the lateral aspect  of both legs, dimensions 6.4 x 2.2 x 0.1 to the right lateral leg and  dimensions of 3.3 x 1.5 x 0.1 to the left posterior leg.   The patient has been treated before for this disease and was  successfully healed in the past.  We continued to treat her with  compression and Santyl over the past week.   PHYSICAL EXAMINATION:  VITAL SIGNS:  Temperature 98.3, pulse 74,  respirations 18, blood pressure 118/86.  Capillary blood glucose 106.   Her wounds now measure 6.4 x 2.2 x 0.1 on the right lower posterior  lateral extremity.  The wound is clean and granulating well.  We will  treat this with Prisma, Coban Lite.  Today on the left, the wound is 3.3  x 1.5 x 0.1.  We will continue her with Prisma and Coban Lite to this  area.  We will follow up with her in 1 week.      Leonie Man, M.D.  Electronically Signed     PB/MEDQ  D:  11/20/2008  T:  11/21/2008  Job:  [id number]

## 2010-08-13 NOTE — Assessment & Plan Note (Signed)
[facility]   NAMENYALA, KIRCHNER             ACCOUNT NO.:  [id number]   MEDICAL RECORD NO.:  [id number]      DATE OF BIRTH:  1955-07-08   PHYSICIAN:  Jonelle Sports. Sevier, M.D.  VISIT DATE:  06/21/2008                                   OFFICE VISIT   HISTORY:  This 55 year old black female who has been followed for  bilateral stasis ulcerations, which several weeks ago healed on the left  side.  She has had 1 persistent ulcer laterally on the right calf.  That  has been showing progressive improvement, and she arrives today with  really minimal ulcer persisting.  She has had no problems.  There is a  bit of drainage but no odor, no erythema, no pain, no fever or systemic  symptoms.   EXAMINATION:  VITAL SIGNS:  Blood pressure 110/73, pulse 83,  respirations 20, temperature 97.5.  Capillary blood glucose at home this morning was 93 mg/dL.   The wound now measures 1.0 x 0.7 x 0.1 cm and is partially  epithelialized.   IMPRESSION:  Near healing stasis ulcer of right lower extremity.   DISPOSITION:  1. The wound requires no debridement today.  2. It will be dressed with an application of a Silverlon pad, covered      by a SofSorb absorptive pad, and that extremity then placed in a      Profore wrap.   The patient is instructed that Easter Sunday morning, she may remove the  wrap, may shower, pat the area dry, and then covered with a Band-Aid,  and her appointment here will be for the following day on Monday  morning, which will be 12 days hence.  It is anticipated she will likely  be healed by then and can be released from this clinic.           ______________________________  Jonelle Sports. Cheryll Cockayne, M.D.     RES/MEDQ  D:  06/21/2008  T:  06/21/2008  Job:  [id number]

## 2010-08-13 NOTE — Assessment & Plan Note (Signed)
[facility]   Rose [name], Rose [name]             ACCOUNT NO.:  [id number]   MEDICAL RECORD NO.:  [id number]      DATE OF BIRTH:  03-21-1956   PHYSICIAN:  Leonie Man, M.D.    VISIT DATE:  11/27/2008                                   OFFICE VISIT   PROBLEM:  Bilateral venous leg ulcers in this 55 year old patient.  These are recurrent venous ulcers and current dimensions on the right  lateral lower extremity is 6.3 x 2.0 x 0.1 and on the left, it is 3.1 x  1.4 x 0.1.  The patient returns today for followup.  We note that the  most recent cultures that she had of this wound showed that she was  growing an MRSA and Pseudomonas aeruginosa from these wounds.  Prescriptions for doxycycline 100 mg b.i.d. for 14 days and for Cipro  500 mg b.i.d. for 14 days were written, but the patient apparently did  not take the prescriptions to home and get them filled.  She returns  today for reevaluation.   She has not been having chills or fevers, but notes that the wound  dressings have had an awful smell.   On examination today, the patient is a afebrile.  Her capillary blood  glucose is 92.  Both wounds have shown some deterioration with a lot of  green necrotic slough over the surface of the wounds.   TREATMENT:  Both of these wounds were treated with topical 2% lidocaine  jelly and I used a curette to do selective debridements on both these  wounds for less than total of 20 cm2 each.  The wounds are then treated  with Santyl and followed by Kerlix and Coban wrap.  Bandages to be  changed again in the [facility] within 1 week.      Leonie Man, M.D.  Electronically Signed     PB/MEDQ  D:  11/27/2008  T:  11/28/2008  Job:  [id number]

## 2010-08-13 NOTE — Consult Note (Signed)
NAMEMICHELA, Rose [name]             ACCOUNT NO.:  [id number]   MEDICAL RECORD NO.:  [id number]          PATIENT TYPE:  OUT   LOCATION:  [facility]                         FACILITY:  [facility]   PHYSICIAN:  Jonelle Sports. Sevier, M.D. DATE OF BIRTH:  1956/01/27   DATE OF CONSULTATION:  04/05/2008  DATE OF DISCHARGE:                                 CONSULTATION   HISTORY:  This 55 year old black female was seen at the courtesy of the  [facility] and the [facility] for treatment of  bilateral ulcers on the proximal lateral aspects of both calves.   The patient has a complex medical history centered around diabetes  mellitus, which has been present for about 4 years and which is type 2  in nature, by history of smoking and also by history of illicit drug  abuse.  Although she says she has been free of drug use for 3 years and  is in the methadone program, I am not certain of the absolute accuracy  of that information.   The patient also has hypertension and chronic bronchitis.   Apparently, she injected drugs intravenously in her lower extremities  using the superficial veins of both calves.  She has numerous tracts  from this habit.  She cannot remember whether she exactly injected ever  in the areas where the ulcers now appear.   Her legs have been intensely edematous for significant period of time,  but she has never had anything to study whether or not she may have  induced some chronic deep vein thrombosis.  She has been on Lasix.   The ulcers as described first began about 2 months ago and have become  progressively larger since.  Initially, she said they were quite ragged  in appearance, but they have gradually cleaned up with her using just  gentle cleansing and peroxide.  She had never called them to attention  of the physician and they were not noted until she was recently seen in  the [facility] Emergency Room where they were discovered and this referral was  established.   She has had no treatment other than the self-prescribed cleansing with  peroxide as previously indicated.   PAST MEDICAL HISTORY:  The patient has had an appendectomy in 1972.  No  other surgeries of which she makes [place] aware.  She has had several  hospitalizations through the years in association with her chronic  bronchitis and her drug-induced problems.   Her regular medications include metformin 1000 mg b.i.d.,  hydrochlorothiazide 25 mg daily, Glucotrol 10 mg daily, furosemide 20 mg  daily, Xyzal 5 mg daily, Advair 100/50 one puff twice daily, Ventolin 90  mcg p.r.n. that is in tablet form, Paxil 20 mg p.o. daily, aspirin 81 mg  daily, Lantus 10 units b.i.d., and methadone 85 mg daily.  She has no  known medicinal allergies.   FAMILY HISTORY:  Not known in great detail, but apparently is absolutely  studded throughout with diabetes which apparently includes both type 1  and type 2.  There is also a family history of heart attack, asthma,  high blood  pressure, and stroke.   PERSONAL HISTORY:  The patient is currently unemployed.  She is followed  by the methadone clinic for her drug abuse habit, which she says is not  presently active.  She has also smoked marijuana in the past, but does  not do so now.  She has been in an inveterate smoker of cigarettes and  remains on one half-pack per day to this point.  She uses alcohol, she  says presently only about once or twice per year.   REVIEW OF SYSTEMS:  Noteworthy for having been told that she has some  disease in her arteries of her legs; some numbness, which she  experiences in the legs; for diabetes as previously indicated; and  history of repeated skin infections.  She also has chronic bronchitis  and is medicated as indicated above.  She has had a broken bone in the  distant past and has a history of anxiety and depression.   PHYSICAL EXAMINATION:  Blood pressure 130/78, pulse 101 and regular,  respirations 18, temperature  98.4, self-obtained capillary blood glucose  is 113 mg/dL.  She is a centrally obese small-framed black female who  appears her stated age.  She has numerous changes to reflect intravenous  drug use in both the upper and lower extremities, none of which appear  to be active lesions.  She is having no difficulty breathing at the  moment and no audible wheeze.  Her heart is regular in rhythm with no  definite murmur or gallop.  Her abdomen is protuberant.  Her extremities  show diminished distal pulses, which on Doppler proved to be biphasic at  both dorsalis pedis locations and the right posterior tibial location,  but monophasic at the left posterior tibial location.  She has tense  edema from just above the knees distalward in the lower extremities.  On  the lateral aspects of both lower extremities, she has active  ulcerations.  The proximal lateral aspect of these extremities on the  left, the lesion measures 3.3 x 1.3 cm, and on the right 6.2 x 2.2 cm.  Both were covered with significant slough.   IMPRESSION:  Suspected deep vein thrombosis in the lower extremities  with resultant tense edema and now venous stasis ulcerations.   Peripheral vascular disease, relatively minor in the lower extremities;  diabetes mellitus type 2; chronic bronchitis; history of drug abuse,  still with methadone dependence.   DISPOSITION:  Both wounds are sharply debrided selectively of the crusts  and some loose skin at the margins, this is well tolerated.  There is  moderate bleeding, but this is easily controlled with application of  pressure, both these debridements are done under protection of 5%  topical lidocaine ointment.   Following this, the wounds were treated with an application of Prisma  absorptive pads and she is placed in Profore wraps bilaterally.   She will be sent for venous Dopplers of both legs to rule out deep vein  obstruction.   Her followup visit will be here in 1 week, but  she is cautioned that if  the wraps are poorly tolerated, she should return sooner.           ______________________________  Jonelle Sports Cheryll Cockayne, M.D.     RES/MEDQ  D:  04/05/2008  T:  04/05/2008  Job:  [id number]   cc:   [facility]

## 2010-08-13 NOTE — Assessment & Plan Note (Signed)
[facility]   NAMEPEGEEN, STIGER             ACCOUNT NO.:  [id number]   MEDICAL RECORD NO.:  [id number]      DATE OF BIRTH:  04-07-1955   PHYSICIAN:  Jonelle Sports. Sevier, M.D.  VISIT DATE:  04/19/2008                                   OFFICE VISIT   HISTORY:  This 55 year old black female with severe venostasis disease  in her lower extremity, being treated for bilateral stasis ulcerations  on the lateral aspects of both midcalves.  These have responded well to  conventional treatment and her progress over the last week has no  exception.  She has had no particular problems and has tolerated her  bilateral PRAFO wraps without difficulty.   EXAMINATION:  Blood pressure 135/84, pulse 83, respirations 16,  temperature 98.1.  Random blood glucose 97 mg/dL.   The wound on the left lateral lower extremity now measures 1.7 x 1.0 x  0.1 cm and that on the right 5.4 x 1.5 x 0.1 cm of both of which are  substantially smaller than 1 week ago.   IMPRESSION:  Improvement bilateral stasis ulcerations.   DISPOSITION:  Neither wound requires debridement today.   Both will be dressed again with Prisma, an absorptive pad, and a PRAFO  wrap and bilaterally.   The patient will return in 1 week to the nurse for dressing change and 2  weeks to the physician.           ______________________________  Jonelle Sports. Cheryll Cockayne, M.D.     RES/MEDQ  D:  04/19/2008  T:  04/19/2008  Job:  [id number]

## 2010-08-13 NOTE — Assessment & Plan Note (Signed)
[facility]   NAMEMARIKO, [name]             ACCOUNT NO.:  [id number]   MEDICAL RECORD NO.:  [id number]      DATE OF BIRTH:  1955-07-04   PHYSICIAN:  Maxwell Caul, M.D. VISIT DATE:  07/13/2008                                   OFFICE VISIT   [name] is a woman with venous stasis ulcer on the right lateral leg.  She was last seen here a week ago, had hydrogel gauze under a Profore.  She returns today in followup.   On examination, the wound is totally epithelialized and I have declared  it resolved.  We transitioned her to Tubigrip stockings to the right  leg, and we will be able to discharge her as of today.   IMPRESSION:  Venous stasis ulceration, actually resolved at this point.  We have transitioned her to Tubigrip stockings.           ______________________________  Maxwell Caul, M.D.     MGR/MEDQ  D:  07/13/2008  T:  07/14/2008  Job:  [id number]

## 2010-08-13 NOTE — Assessment & Plan Note (Signed)
[facility]   Rose [name], Rose [name]             ACCOUNT NO.:  [id number]   MEDICAL RECORD NO.:  [id number]      DATE OF BIRTH:  [birth year]   PHYSICIAN:  Leonie Man, M.D.    VISIT DATE:  11/13/2008                                   OFFICE VISIT   PROBLEM:  Recurrent venous stasis ulcers with ulcers located on the  lateral aspect of both legs in this 55 year old female.  On the right  leg laterally, there was a 6.4 x 2.4 x 0.1 ulcer and on the left leg  laterally there was a 3.0 x 1.5 x 0.1 ulcer.  The patient has been  treated with Santyl damp to dry dressings with a Kerlix and Coban which  have been changed every other day.  The patient does not have any  ongoing pain or discomfort at this time and the wounds are closing  satisfactorily.   PHYSICAL EXAMINATION:  VITAL SIGNS:  Temperature 98.6, pulse 78,  respirations 16, blood pressure is 120/80.  Capillary blood glucose is  92.  The current wound measurements are down significantly from her last  visit here on November 06, 2008.  There is advancing epithelialization on  all fronts and there is no significant drainage and there is no odor  from the wounds.   ASSESSMENT:  Continued improvement in recurrent venous stasis ulcer  disease.   PLAN:  Continue her on Kerlix and Coban dressings over a Santyl damp to  dry dressing.  Followup will be in 1 week.      Leonie Man, M.D.  Electronically Signed     PB/MEDQ  D:  11/13/2008  T:  11/14/2008  Job:  [id number]

## 2010-08-13 NOTE — Assessment & Plan Note (Signed)
[facility]   [name], [name]             ACCOUNT NO.:  [id number]   MEDICAL RECORD NO.:  [id number]      DATE OF BIRTH:  March 24, 1956   PHYSICIAN:  Jonelle Sports. Sevier, M.D.  VISIT DATE:  04/26/2008                                   OFFICE VISIT   HISTORY:  This 55 year old black female was seen for bilateral stasis  ulcerations on the lateral aspects of both calves occurring in areas of  old traumatically damaged skin.   Most recently, we have been treating these with Prisma, absorptive pads,  and bilateral Profore wraps and she has shown progressive improvement.   She reports that since she was last here, she has really had no  particular problems as some occasional discomfort in her legs but this  does not seem to be related directly to the wounds themselves.  She has  had no fever or systemic symptoms.   PHYSICAL EXAMINATION:  VITAL SIGNS:  Blood pressure 114/70, pulse 80,  respirations 18, temperature 98.0, and blood glucose 101 mg/dL today.   The wound on the lateral left lower extremity measures 2.2 x 0.8 x 0.2  cm and the one on the right lateral lower extremity measures 4.9 x 1.5 x  0.2 cm.  Both of these are slightly smaller and both have nicely  granular bases.  There is some evidence of crust accumulation at the  wound margins at least part of which is probably some residual from  where the [facility] application has overhung the wound.   IMPRESSION:  Satisfactory progress of chronic wounds, bilateral lower  extremities.   DISPOSITION:  Both wounds are selectively debrided off the previously  mentioned crusts under cover of 5% topical lidocaine gel.  This was well  tolerated.  Following this, the wounds are dressed with applications of  Prisma, covered with absorptive pads, and she is placed in bilateral  Profore wraps.   She will return to the nurse in 1 week for dressing change and to the  physician in 2 weeks.     ______________________________  Jonelle Sports Cheryll Cockayne, M.D.     RES/MEDQ  D:  04/26/2008  T:  04/26/2008  Job:  [id number]

## 2010-08-13 NOTE — Assessment & Plan Note (Signed)
[facility]   Rose [name], Rose [name]             ACCOUNT NO.:  [id number]   MEDICAL RECORD NO.:  [id number]      DATE OF BIRTH:  10-04-1955   PHYSICIAN:  Leonie Man, M.D.    VISIT DATE:  11/06/2008                                   OFFICE VISIT   PROBLEM:  Recurrent venous stasis disease with venous stasis ulcers.   SUBJECTIVE:  A 55 year old female with bilateral venous leg ulcers on  the lateral aspect of both legs, now currently being treated with Santyl  damp-to-dry dressings and Kerlix and Coban.  The dressings were to have  been changed every other day by the patient; however, she does not seem  to be doing this and has not been changing her dressings.  She returns  today for an evaluation.   EXAMINATION:  Temperature 98.1, pulse 76, respirations 18, blood  pressure 109/73.  Capillary blood glucose today is 52.  She appears  asymptomatic from this side.  I have asked her to have something to eat  at the clinic before leaving.   Current wound measurements on the right lower extremity is 6.9 x 2.2 x  less than 0.1 and on the left lower extremity it is 3.9 x 1.6 x less  than 0.1.  Surrounding skin is healing, and the wounds are  epithelializing well.  There are some patches within the center of the  wound bed which shows some slough.  We will continue to treat these  areas.   Improving venous stasis ulcers.   TREATMENT:  Continue Santyl damp-to-dry dressings over those areas of  continued necrosis, wet-to-dry dressings over the granulating surfaces,  and a Kerlix Coban dressing.  I will have the patient come back to the  office to the wound care center on Wednesday and Friday of this week so  that we may change her dressings, and we will follow up with another MD  visit in 1 week.      Leonie Man, M.D.  Electronically Signed     PB/MEDQ  D:  11/06/2008  T:  11/07/2008  Job:  [id number]

## 2010-08-13 NOTE — Assessment & Plan Note (Signed)
[facility]   Rose [name], Rose [name]             ACCOUNT NO.:  [id number]   MEDICAL RECORD NO.:  [id number]      DATE OF BIRTH:  1955/07/11   PHYSICIAN:  Jonelle Sports. Sevier, M.D.  VISIT DATE:  06/07/2008                                   OFFICE VISIT   HISTORY:  This 55 year old black female has been followed for unusual  stasis ulcerations on the high lateral aspect of both calves.   When she was seen a week ago, the left side was essentially healed but  was continued in a compressive wrap.   The ulcer on the right side has shown improvement but had not resolved.  She was treated again with topical application of hydrogel and Silverlon  and that extremity likewise placed in a compressive wrap.   She has had no problems in the interim week, feels that she is doing  well.   PHYSICAL EXAMINATION:  VITAL SIGNS:  Blood pressure 134/79, pulse 86,  respirations 21, and temperature 98.5.  Random blood glucose 94 mg/dL  this morning.  EXTREMITIES:  The left lower extremity wound is indeed completely  healed.  There is still some degree of edema in that extremity.   There was also some persistent edema in the right lower extremity  despite our compressive wrap, but the wound itself shows continued  improvement and now measures 0.7 x 0.8 cm from the surface and  approximately 0.15 cm in depth.   It has a clean base with some areas of epithelialization.  There is some  loose and flaky skin at the wound margins.   IMPRESSION:  Satisfactory course of stasis wound, right lower extremity  with healing stasis wound in left lower extremity.   DISPOSITION:  The right lower extremity wound is gently debrided  selectively of small amount of skin crusts and a small amount of slough  in the base, particularly at the proximal end of the wound.  This is  well tolerated.  The wound was then dressed with an application of  hydrogel and Silverlon and that extremity placed again in  a Profore  wrap.   The left lower extremity today will be placed in a Kerlix and Coban  wrap.  She is to continue her efforts to obtain the compression  stockings which have been ordered.   Follow up visit will be in 1 week to the nurse for dressing and 2 weeks  with the physician.          ______________________________  Jonelle Sports. Cheryll Cockayne, M.D.    RES/MEDQ  D:  06/07/2008  T:  06/07/2008  Job:  [id number]

## 2010-10-29 ENCOUNTER — Other Ambulatory Visit: Payer: Self-pay | Admitting: Family Medicine

## 2010-10-29 ENCOUNTER — Other Ambulatory Visit (HOSPITAL_COMMUNITY)
Admission: RE | Admit: 2010-10-29 | Discharge: 2010-10-29 | Disposition: A | Discharge status: Home / Self Care | Payer: PRIVATE HEALTH INSURANCE | Source: Ambulatory Visit | Attending: Family Medicine | Admitting: Family Medicine

## 2010-10-29 DIAGNOSIS — Z124 Encounter for screening for malignant neoplasm of cervix: Secondary | ICD-10-CM | POA: Insufficient documentation

## 2011-01-03 LAB — CBC
HCT: 37.1 % (ref 36.0–46.0)
Hemoglobin: 12.1 g/dL (ref 12.0–15.0)
MCHC: 32.6 g/dL (ref 30.0–36.0)
MCV: 84.6 fL (ref 78.0–100.0)
Platelets: 265 10*3/uL (ref 150–400)
RBC: 4.39 MIL/uL (ref 3.87–5.11)
RDW: 13.8 % (ref 11.5–15.5)
WBC: 12.9 10*3/uL — ABNORMAL HIGH (ref 4.0–10.5)

## 2011-01-03 LAB — DIFFERENTIAL
Basophils Absolute: 0.1 10*3/uL (ref 0.0–0.1)
Basophils Relative: 1 % (ref 0–1)
Eosinophils Absolute: 0.2 10*3/uL (ref 0.0–0.7)
Eosinophils Relative: 1 % (ref 0–5)
Lymphocytes Relative: 37 % (ref 12–46)
Lymphs Abs: 4.7 10*3/uL — ABNORMAL HIGH (ref 0.7–4.0)
Monocytes Absolute: 0.4 10*3/uL (ref 0.1–1.0)
Monocytes Relative: 3 % (ref 3–12)
Neutro Abs: 7.5 10*3/uL (ref 1.7–7.7)
Neutrophils Relative %: 58 % (ref 43–77)

## 2011-01-03 LAB — POCT I-STAT, CHEM 8
BUN: 18 mg/dL (ref 6–23)
Calcium, Ion: 0.94 mmol/L — ABNORMAL LOW (ref 1.12–1.32)
Chloride: 98 mEq/L (ref 96–112)
Creatinine, Ser: 1.3 mg/dL — ABNORMAL HIGH (ref 0.4–1.2)
Glucose, Bld: 97 mg/dL (ref 70–99)
HCT: 40 % (ref 36.0–46.0)
Hemoglobin: 13.6 g/dL (ref 12.0–15.0)
Potassium: 3.8 mEq/L (ref 3.5–5.1)
Sodium: 136 mEq/L (ref 135–145)
TCO2: 27 mmol/L (ref 0–100)

## 2011-03-24 ENCOUNTER — Observation Stay (HOSPITAL_COMMUNITY): Payer: PRIVATE HEALTH INSURANCE

## 2011-03-24 ENCOUNTER — Observation Stay (HOSPITAL_COMMUNITY)
Admission: EM | Admit: 2011-03-24 | Discharge: 2011-03-24 | Disposition: A | Discharge status: Home / Self Care | Payer: PRIVATE HEALTH INSURANCE | Attending: Emergency Medicine | Admitting: Emergency Medicine

## 2011-03-24 ENCOUNTER — Encounter: Payer: Self-pay | Admitting: *Deleted

## 2011-03-24 DIAGNOSIS — F17200 Nicotine dependence, unspecified, uncomplicated: Secondary | ICD-10-CM | POA: Insufficient documentation

## 2011-03-24 DIAGNOSIS — R609 Edema, unspecified: Secondary | ICD-10-CM

## 2011-03-24 DIAGNOSIS — L0390 Cellulitis, unspecified: Secondary | ICD-10-CM

## 2011-03-24 DIAGNOSIS — I2510 Atherosclerotic heart disease of native coronary artery without angina pectoris: Secondary | ICD-10-CM | POA: Insufficient documentation

## 2011-03-24 DIAGNOSIS — I10 Essential (primary) hypertension: Secondary | ICD-10-CM | POA: Insufficient documentation

## 2011-03-24 DIAGNOSIS — L02619 Cutaneous abscess of unspecified foot: Principal | ICD-10-CM | POA: Insufficient documentation

## 2011-03-24 DIAGNOSIS — E119 Type 2 diabetes mellitus without complications: Secondary | ICD-10-CM | POA: Insufficient documentation

## 2011-03-24 DIAGNOSIS — M25819 Other specified joint disorders, unspecified shoulder: Secondary | ICD-10-CM | POA: Insufficient documentation

## 2011-03-24 HISTORY — DX: Unspecified osteoarthritis, unspecified site: M19.90

## 2011-03-24 HISTORY — DX: Atherosclerotic heart disease of native coronary artery without angina pectoris: I25.10

## 2011-03-24 HISTORY — DX: Essential (primary) hypertension: I10

## 2011-03-24 LAB — CBC
HCT: 34.2 % — ABNORMAL LOW (ref 36.0–46.0)
Hemoglobin: 11.5 g/dL — ABNORMAL LOW (ref 12.0–15.0)
MCH: 28.7 pg (ref 26.0–34.0)
MCHC: 33.6 g/dL (ref 30.0–36.0)
MCV: 85.3 fL (ref 78.0–100.0)
Platelets: 207 10*3/uL (ref 150–400)
RBC: 4.01 MIL/uL (ref 3.87–5.11)
RDW: 13.9 % (ref 11.5–15.5)
WBC: 9.4 10*3/uL (ref 4.0–10.5)

## 2011-03-24 LAB — BASIC METABOLIC PANEL
BUN: 23 mg/dL (ref 6–23)
CO2: 29 mEq/L (ref 19–32)
Calcium: 9.1 mg/dL (ref 8.4–10.5)
Chloride: 93 mEq/L — ABNORMAL LOW (ref 96–112)
Creatinine, Ser: 1.21 mg/dL — ABNORMAL HIGH (ref 0.50–1.10)
GFR calc Af Amer: 58 mL/min — ABNORMAL LOW (ref >90)
GFR calc non Af Amer: 50 mL/min — ABNORMAL LOW (ref >90)
Glucose, Bld: 99 mg/dL (ref 70–99)
Potassium: 3.5 mEq/L (ref 3.5–5.1)
Sodium: 132 mEq/L — ABNORMAL LOW (ref 135–145)

## 2011-03-24 LAB — DIFFERENTIAL
Basophils Absolute: 0 10*3/uL (ref 0.0–0.1)
Basophils Relative: 0 % (ref 0–1)
Eosinophils Absolute: 0.2 10*3/uL (ref 0.0–0.7)
Eosinophils Relative: 2 % (ref 0–5)
Lymphocytes Relative: 36 % (ref 12–46)
Lymphs Abs: 3.4 10*3/uL (ref 0.7–4.0)
Monocytes Absolute: 0.7 10*3/uL (ref 0.1–1.0)
Monocytes Relative: 7 % (ref 3–12)
Neutro Abs: 5.2 10*3/uL (ref 1.7–7.7)
Neutrophils Relative %: 55 % (ref 43–77)

## 2011-03-24 MED ORDER — ONDANSETRON HCL 4 MG/2ML IJ SOLN
4.0000 mg | Freq: Once | INTRAMUSCULAR | Status: AC
  Administered 2011-03-24: 4 mg via INTRAVENOUS

## 2011-03-24 MED ORDER — SULFAMETHOXAZOLE-TRIMETHOPRIM 800-160 MG PO TABS: 1.0000 | ORAL_TABLET | Freq: Two times a day (BID) | ORAL | Status: AC

## 2011-03-24 MED ORDER — MORPHINE SULFATE 2 MG/ML IJ SOLN: 2.0000 mg | INTRAMUSCULAR | Status: DC | PRN

## 2011-03-24 MED ORDER — ONDANSETRON HCL 4 MG/2ML IJ SOLN
4.0000 mg | Freq: Once | INTRAMUSCULAR | Status: DC
  Filled 2011-03-24: qty 2

## 2011-03-24 MED ORDER — VANCOMYCIN HCL IN DEXTROSE 1-5 GM/200ML-% IV SOLN
1000.0000 mg | Freq: Once | INTRAVENOUS | Status: AC
  Administered 2011-03-24: 1000 mg via INTRAVENOUS
  Filled 2011-03-24: qty 200

## 2011-03-24 MED ORDER — MORPHINE SULFATE 2 MG/ML IJ SOLN
2.0000 mg | Freq: Once | INTRAMUSCULAR | Status: AC
  Administered 2011-03-24: 2 mg via INTRAVENOUS
  Filled 2011-03-24: qty 1

## 2011-03-24 MED ORDER — CEPHALEXIN 500 MG PO CAPS: 500.0000 mg | ORAL_CAPSULE | Freq: Four times a day (QID) | ORAL | Status: AC

## 2011-03-24 NOTE — ED Notes (Signed)
Aleatha Borer, PA states pt will be obs status under cellulitis protocol

## 2011-03-24 NOTE — ED Notes (Signed)
To ed for eval of numbness to both hands for the past cple of days. Intermittent. Denies neck pain.
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
## 2011-03-24 NOTE — ED Provider Notes (Signed)
Medical screening examination/treatment/procedure(s) were conducted as a shared visit with non-physician practitioner(s) and myself.  I personally evaluated the patient during the encounter   Glynn Octave, MD 03/24/11 1902

## 2011-03-24 NOTE — ED Provider Notes (Signed)
History     CSN: [id number]  Arrival date & time 03/24/11  1404   First MD Initiated Contact with Patient 03/24/11 1453      Chief Complaint  Patient presents with  . Numbness    (Consider location/radiation/quality/duration/timing/severity/associated sxs/prior treatment) HPI Comments: Complaining of bilateral lower extremities swelling and pain for the past 3 days. Denies any chest pain, shortness of breath, cough, congestion, fever. He said intermittent tingling in her hands as well. Denies any headache, neck pain, fever chills.  There is some redness to her left leg that extends up towards her n arrival increased worse in the left. No history of blood clots.  She is diabetic and has a history of hypertension and coronary disease.  Contrary to triage note, she denies any upper extremity or hand symptoms.    The history is provided by the patient.    Past Medical History  Diagnosis Date  . Coronary artery disease   . Diabetes mellitus   . Hypertension   . Arthritis   . Drug abuse     History reviewed. No pertinent past surgical history.  History reviewed. No pertinent family history.  History  Substance Use Topics  . Smoking status: Current Everyday Smoker  . Smokeless tobacco: Not on file  . Alcohol Use:     OB History    Grav Para Term Preterm Abortions TAB SAB Ect Mult Living                  Review of Systems  Constitutional: Negative for fever, activity change and appetite change.  HENT: Negative for congestion and rhinorrhea.   Respiratory: Negative for cough, chest tightness and shortness of breath.   Gastrointestinal: Negative for nausea, vomiting and abdominal pain.  Genitourinary: Negative for dysuria.  Musculoskeletal: Positive for myalgias, arthralgias and gait problem.  Skin: Negative for rash.  Neurological: Negative for dizziness, weakness and headaches.  All other systems reviewed and are negative.    Allergies  Gabapentin; Lyrica; and  Vicodin  Home Medications   Current Outpatient Rx  Name Route Sig Dispense Refill  . ARIPIPRAZOLE 2 MG PO TABS Oral Take 2 mg by mouth every morning.      . ASPIRIN 81 MG PO CHEW Oral Chew 81 mg by mouth daily.      . BUPROPION HCL ER (XL) 150 MG PO TB24 Oral Take 150 mg by mouth daily.      [name] CARISOPRODOL 350 MG PO TABS Oral Take 350 mg by mouth 3 (three) times daily as needed. For muscle spasms     . CITALOPRAM HYDROBROMIDE 40 MG PO TABS Oral Take 40 mg by mouth daily.      [name] ESTROGENS CONJUGATED 0.625 MG PO TABS Oral Take 0.625 mg by mouth daily.      . FUROSEMIDE 20 MG PO TABS Oral Take 20 mg by mouth daily.      [name] GABAPENTIN 300 MG PO CAPS Oral Take 300 mg by mouth 3 (three) times daily.      [name] HYDROCHLOROTHIAZIDE 25 MG PO TABS Oral Take 25 mg by mouth daily.      [name] HYDROCODONE-ACETAMINOPHEN 10-500 MG PO TABS Oral Take 1 tablet by mouth every 4 (four) hours as needed. For pain     . INSULIN GLARGINE 100 UNIT/ML [place] SOLN Subcutaneous Inject 20 Units into the skin 2 (two) times daily.      [name] MEDROXYPROGESTERONE ACETATE 2.5 MG PO TABS Oral Take 2.5 mg by mouth daily.      [name]  METFORMIN HCL 1000 MG PO TABS Oral Take 1,000 mg by mouth 2 (two) times daily with a meal.      . METHADONE HCL 10 MG PO TABS Oral Take 100 mg by mouth daily.      [name] MIRABEGRON ER 25 MG PO TB24 Oral Take 1 tablet by mouth daily.      . ADULT MULTIVITAMIN W/MINERALS CH Oral Take 1 tablet by mouth daily.      [name] NABUMETONE 750 MG PO TABS Oral Take 750 mg by mouth 2 (two) times daily.      [name] PRESCRIPTION MEDICATION Topical Apply 1 application topically 4 (four) times daily. 1-2 pumps applied to affected area 3-4 times a day     . CEPHALEXIN 500 MG PO CAPS Oral Take 1 capsule (500 mg total) by mouth 4 (four) times daily. 20 capsule 0  . SULFAMETHOXAZOLE-TRIMETHOPRIM 800-160 MG PO TABS Oral Take 1 tablet by mouth 2 (two) times daily. 14 tablet 0    BP 112/53  Pulse 82  Temp(Src) 98.1 F (36.7 C) (Oral)  Resp 18  SpO2  100%  Physical Exam  Constitutional: She is oriented to person, place, and time. She appears well-developed and well-nourished. No distress.       sleeping  HENT:  Head: Normocephalic and atraumatic.  Mouth/Throat: Oropharynx is clear and moist. No oropharyngeal exudate.  Eyes: Conjunctivae are normal. Pupils are equal, round, and reactive to light.  Neck: Normal range of motion. Neck supple.  Cardiovascular: Normal rate, regular rhythm and normal heart sounds.   Pulmonary/Chest: Effort normal and breath sounds normal. No respiratory distress.  Abdominal: Soft. There is no tenderness. There is no rebound and no guarding.  Musculoskeletal: She exhibits edema and tenderness.       Bilateral calf tightness with streaking erythema on the left medial leg +2 DP and PT pulses. Multiple old scars from previous skin popping  Neurological: She is alert and oriented to person, place, and time. No cranial nerve deficit.       5/5 strength throughout, no facial droop, no ataxia.  Skin: Skin is warm.    ED Course  Procedures (including critical care time)  Labs Reviewed  CBC - Abnormal; Notable for the following:    Hemoglobin 11.5 (*)    HCT 34.2 (*)    All other components within normal limits  BASIC METABOLIC PANEL - Abnormal; Notable for the following:    Sodium 132 (*)    Chloride 93 (*)    Creatinine, Ser 1.21 (*)    GFR calc non Af Amer 50 (*)    GFR calc Af Amer 58 (*)    All other components within normal limits  DIFFERENTIAL  LAB REPORT - SCANNED   Dg Chest 2 View  03/24/2011  *RADIOLOGY REPORT*  Clinical Data: Cough, diabetes  CHEST - 2 VIEW  Comparison: None  Findings: The lungs are clear.  There is some peribronchial thickening which may indicate bronchitis.  The heart is within upper limits of normal.  No bony abnormality is seen.  IMPRESSION: No pneumonia.  Question bronchitis.  Original Report Authenticated By: Juline Patch, M.D.     1. Cellulitis       MDM    Bilateral lower extremity pain and swelling. No fever, shortness of breath, chest pain or vomiting.  Concern for cellulitis in left lower extremity.    Dopplers negative for DVTs  CDU observation for IV antibiotics for LLE cellulitis.  D/w NP Tomasa Blase.  Glynn Octave, MD 03/25/11 1040

## 2011-03-24 NOTE — ED Notes (Signed)
Received report from [place], [employer]ity fundraiser for lunch coverage

## 2011-03-24 NOTE — ED Notes (Signed)
Report given to Annette, RN 

## 2011-03-24 NOTE — Progress Notes (Signed)
   Bilateral:  No evidence of DVT, superficial thrombosis, or Baker's Cyst. Bilateral lower extremity venous duplex completed Rose [name] 03/24/2011, 3:42 PM

## 2011-03-25 NOTE — ED Provider Notes (Signed)
Medical screening examination/treatment/procedure(s) were conducted as a shared visit with non-physician practitioner(s) and myself.  I personally evaluated the patient during the encounter   Caragh Gasper, MD 03/25/11 1036 

## 2011-04-05 ENCOUNTER — Emergency Department (HOSPITAL_COMMUNITY)
Admission: EM | Admit: 2011-04-05 | Discharge: 2011-04-05 | Disposition: A | Discharge status: Home / Self Care | Payer: PRIVATE HEALTH INSURANCE | Attending: Emergency Medicine | Admitting: Emergency Medicine

## 2011-04-05 ENCOUNTER — Encounter (HOSPITAL_COMMUNITY): Payer: Self-pay

## 2011-04-05 DIAGNOSIS — F17200 Nicotine dependence, unspecified, uncomplicated: Secondary | ICD-10-CM | POA: Insufficient documentation

## 2011-04-05 DIAGNOSIS — L02419 Cutaneous abscess of limb, unspecified: Secondary | ICD-10-CM | POA: Insufficient documentation

## 2011-04-05 DIAGNOSIS — Z79899 Other long term (current) drug therapy: Secondary | ICD-10-CM | POA: Insufficient documentation

## 2011-04-05 DIAGNOSIS — Z7982 Long term (current) use of aspirin: Secondary | ICD-10-CM | POA: Insufficient documentation

## 2011-04-05 DIAGNOSIS — M129 Arthropathy, unspecified: Secondary | ICD-10-CM | POA: Insufficient documentation

## 2011-04-05 DIAGNOSIS — R220 Localized swelling, mass and lump, head: Secondary | ICD-10-CM | POA: Insufficient documentation

## 2011-04-05 DIAGNOSIS — I2510 Atherosclerotic heart disease of native coronary artery without angina pectoris: Secondary | ICD-10-CM | POA: Insufficient documentation

## 2011-04-05 DIAGNOSIS — M79609 Pain in unspecified limb: Secondary | ICD-10-CM | POA: Insufficient documentation

## 2011-04-05 DIAGNOSIS — E119 Type 2 diabetes mellitus without complications: Secondary | ICD-10-CM | POA: Insufficient documentation

## 2011-04-05 DIAGNOSIS — L0390 Cellulitis, unspecified: Secondary | ICD-10-CM

## 2011-04-05 DIAGNOSIS — R221 Localized swelling, mass and lump, neck: Secondary | ICD-10-CM | POA: Insufficient documentation

## 2011-04-05 DIAGNOSIS — L03119 Cellulitis of unspecified part of limb: Secondary | ICD-10-CM | POA: Insufficient documentation

## 2011-04-05 DIAGNOSIS — I10 Essential (primary) hypertension: Secondary | ICD-10-CM | POA: Insufficient documentation

## 2011-04-05 MED ORDER — CLINDAMYCIN HCL 300 MG PO CAPS: 300.0000 mg | ORAL_CAPSULE | Freq: Four times a day (QID) | ORAL | Status: AC

## 2011-04-05 MED ORDER — VANCOMYCIN HCL IN DEXTROSE 1-5 GM/200ML-% IV SOLN
1000.0000 mg | Freq: Once | INTRAVENOUS | Status: AC
  Administered 2011-04-05: 1000 mg via INTRAVENOUS
  Filled 2011-04-05: qty 200

## 2011-04-05 NOTE — ED Notes (Signed)
Pt with c/o pain in the left inner knee, swelling, hx of cellulitis in the same leg, took all abx

## 2011-04-05 NOTE — ED Notes (Signed)
VANCOMYCIIN 1 GRAM IV INFUSING WITH NO ADVERSE EFFECT , IV SITE UNREMARKABLE ,. NO PAIN OR DISCOMFORT AT THIS TIME , AMBULATED TO RESTROOM ,  MEAL GIVEN TO PT.

## 2011-04-05 NOTE — ED Provider Notes (Signed)
History     CSN: [id number]  Arrival date & time 04/05/11  1443   First MD Initiated Contact with Patient 04/05/11 1857      Chief Complaint  Patient presents with  . Leg Pain    (Consider location/radiation/quality/duration/timing/severity/associated sxs/prior treatment) HPI This 56 year old female was seen about 2 weeks ago and treated as an outpatient with Keflex for a left leg cellulitis with ultrasound negative for deep venous thrombosis at that time, the patient states her redness was improving and much better after taking the Keflex however over the last few days she has some recurrent redness and tenderness to that leg again and to the patient it looks sick or cellulitis is starting to come back again. There is no skin breakdown with no skin ulcers and no drainage. She has no new weakness or numbness to that leg. She has no fever vomiting chest pain shortness of breath or abdominal pain. She has no confusion or dizziness. Past Medical History  Diagnosis Date  . Coronary artery disease   . Diabetes mellitus   . Hypertension   . Arthritis   . Drug abuse     History reviewed. No pertinent past surgical history.  History reviewed. No pertinent family history.  History  Substance Use Topics  . Smoking status: Current Everyday Smoker -- 1.0 packs/day  . Smokeless tobacco: Not on file  . Alcohol Use: No    OB History    Grav Para Term Preterm Abortions TAB SAB Ect Mult Living                  Review of Systems  Constitutional: Negative for fever.       10 Systems reviewed and are negative for acute change except as noted in the HPI.  HENT: Negative for congestion.   Eyes: Negative for discharge and redness.  Respiratory: Negative for cough and shortness of breath.   Cardiovascular: Negative for chest pain.  Gastrointestinal: Negative for vomiting and abdominal pain.  Musculoskeletal: Negative for back pain.  Skin: Positive for rash.  Neurological: Negative for  syncope, numbness and headaches.  Psychiatric/Behavioral:       No behavior change.    Allergies  Gabapentin; Lyrica; and Vicodin  Home Medications   Current Outpatient Rx  Name Route Sig Dispense Refill  . ARIPIPRAZOLE 2 MG PO TABS Oral Take 2 mg by mouth every morning.      . ASPIRIN 81 MG PO CHEW Oral Chew 81 mg by mouth daily.      . BUPROPION HCL ER (XL) 150 MG PO TB24 Oral Take 150 mg by mouth daily.      [name] CARISOPRODOL 350 MG PO TABS Oral Take 350 mg by mouth 3 (three) times daily as needed. For muscle spasms     . CITALOPRAM HYDROBROMIDE 40 MG PO TABS Oral Take 40 mg by mouth daily.      [name] ESTROGENS CONJUGATED 0.625 MG PO TABS Oral Take 0.625 mg by mouth daily.      . FUROSEMIDE 20 MG PO TABS Oral Take 20 mg by mouth daily.      [name] GABAPENTIN 300 MG PO CAPS Oral Take 300 mg by mouth 3 (three) times daily.      [name] HYDROCHLOROTHIAZIDE 25 MG PO TABS Oral Take 25 mg by mouth daily.      [name] HYDROCODONE-ACETAMINOPHEN 10-500 MG PO TABS Oral Take 1 tablet by mouth every 4 (four) hours as needed. For pain     .  INSULIN GLARGINE 100 UNIT/ML [place] SOLN Subcutaneous Inject 20 Units into the skin 2 (two) times daily.      [name] MEDROXYPROGESTERONE ACETATE 2.5 MG PO TABS Oral Take 2.5 mg by mouth daily.      [name] METFORMIN HCL 1000 MG PO TABS Oral Take 1,000 mg by mouth 2 (two) times daily with a meal.      . METHADONE HCL 10 MG PO TABS Oral Take 100 mg by mouth daily.      [name] MIRABEGRON ER 25 MG PO TB24 Oral Take 1 tablet by mouth daily.      . ADULT MULTIVITAMIN W/MINERALS CH Oral Take 1 tablet by mouth daily.      [name] CLINDAMYCIN HCL 300 MG PO CAPS Oral Take 1 capsule (300 mg total) by mouth 4 (four) times daily. X 7 days 28 capsule 0  . NABUMETONE 750 MG PO TABS Oral Take 750 mg by mouth 2 (two) times daily.        BP 107/67  Pulse 78  Temp(Src) 98 F (36.7 C) (Oral)  Resp 18  SpO2 97%  Physical Exam  Nursing note and vitals reviewed. Constitutional:       Awake, alert, nontoxic appearance.    HENT:  Head: Atraumatic.  Eyes: Right eye exhibits no discharge. Left eye exhibits no discharge.  Neck: Neck supple.  Cardiovascular: Normal rate and regular rhythm.   No murmur heard. Pulmonary/Chest: Effort normal and breath sounds normal. No respiratory distress. She has no wheezes. She has no rales. She exhibits no tenderness.  Abdominal: Soft. There is no tenderness. There is no rebound.  Musculoskeletal: She exhibits edema and tenderness.       Baseline ROM, no obvious new focal weakness.  Her arms and right leg are nontender. Her left leg has mild erythema and tenderness and edema from the medial ankle to the medial knee region consistent with mild localized cellulitis without subcutaneous emphysema or severe tenderness or swelling and I doubt compartment syndrome or necrotizing fasciitis. She is baseline light touch to her left foot with capillary refill less than 2 seconds in all toes.  Neurological:       Mental status and motor strength appears baseline for patient and situation.  Skin: No rash noted.  Psychiatric: She has a normal mood and affect.    ED Course  Procedures (including critical care time)  Labs Reviewed - No data to display No results found.   1. Cellulitis       MDM  Patient informed of clinical course, understand medical decision-making process, and agrees with plan.  I doubt any other EMC precluding discharge at this time including, but not necessarily limited to the following:nec fasc, sepsis.        Hurman Horn, MD 04/05/11 2259

## 2011-04-06 ENCOUNTER — Encounter (HOSPITAL_COMMUNITY): Payer: Self-pay | Admitting: *Deleted

## 2011-04-06 ENCOUNTER — Emergency Department (HOSPITAL_COMMUNITY)
Admission: EM | Admit: 2011-04-06 | Discharge: 2011-04-06 | Disposition: A | Discharge status: Home / Self Care | Payer: PRIVATE HEALTH INSURANCE | Attending: Emergency Medicine | Admitting: Emergency Medicine

## 2011-04-06 DIAGNOSIS — Z7982 Long term (current) use of aspirin: Secondary | ICD-10-CM | POA: Insufficient documentation

## 2011-04-06 DIAGNOSIS — Z79899 Other long term (current) drug therapy: Secondary | ICD-10-CM | POA: Insufficient documentation

## 2011-04-06 DIAGNOSIS — I2510 Atherosclerotic heart disease of native coronary artery without angina pectoris: Secondary | ICD-10-CM | POA: Insufficient documentation

## 2011-04-06 DIAGNOSIS — E119 Type 2 diabetes mellitus without complications: Secondary | ICD-10-CM | POA: Insufficient documentation

## 2011-04-06 DIAGNOSIS — L03119 Cellulitis of unspecified part of limb: Secondary | ICD-10-CM | POA: Insufficient documentation

## 2011-04-06 DIAGNOSIS — M7989 Other specified soft tissue disorders: Secondary | ICD-10-CM | POA: Insufficient documentation

## 2011-04-06 DIAGNOSIS — M79609 Pain in unspecified limb: Secondary | ICD-10-CM | POA: Insufficient documentation

## 2011-04-06 DIAGNOSIS — R21 Rash and other nonspecific skin eruption: Secondary | ICD-10-CM | POA: Insufficient documentation

## 2011-04-06 DIAGNOSIS — L02419 Cutaneous abscess of limb, unspecified: Secondary | ICD-10-CM | POA: Insufficient documentation

## 2011-04-06 DIAGNOSIS — L0390 Cellulitis, unspecified: Secondary | ICD-10-CM

## 2011-04-06 DIAGNOSIS — I10 Essential (primary) hypertension: Secondary | ICD-10-CM | POA: Insufficient documentation

## 2011-04-06 DIAGNOSIS — M129 Arthropathy, unspecified: Secondary | ICD-10-CM | POA: Insufficient documentation

## 2011-04-06 DIAGNOSIS — R609 Edema, unspecified: Secondary | ICD-10-CM | POA: Insufficient documentation

## 2011-04-06 MED ORDER — VANCOMYCIN HCL IN DEXTROSE 1-5 GM/200ML-% IV SOLN
1000.0000 mg | Freq: Once | INTRAVENOUS | Status: AC
  Administered 2011-04-06: 1000 mg via INTRAVENOUS
  Filled 2011-04-06: qty 200

## 2011-04-06 NOTE — ED Notes (Signed)
Reports being told by dr bednar to return today for eval if she needed more antibiotics or removal of picc line right arm.

## 2011-04-06 NOTE — ED Provider Notes (Signed)
History     CSN: [id number]  Arrival date & time 04/06/11  1506   First MD Initiated Contact with Patient 04/06/11 1803      CC: scheduled recheck for cellulitis left leg  (Consider location/radiation/quality/duration/timing/severity/associated sxs/prior treatment) HPI This 56 year old female was seen about 2 weeks ago and treated as an outpatient with Keflex for a left leg cellulitis with ultrasound negative for deep venous thrombosis at that time, the patient states her redness was improving and much better after taking the Keflex however over the last few days she has some recurrent redness and tenderness to that leg again and to the patient it looks sick or cellulitis is starting to come back again. There is no skin breakdown with no skin ulcers and no drainage. She has no new weakness or numbness to that leg. She has no fever vomiting chest pain shortness of breath or abdominal pain. She has no confusion or dizziness.  I saw her yesterday and gave her one dose of vancomycin IV and discharged her on oral clindamycin, the patient returns today for a scheduled recheck for a planned repeat dose of vancomycin if she is not significantly improved. Her cellulitis is about the same. She is no fever no vomiting no shortness of breath no significant change in the left leg redness or swelling, it is not much worse if at all but it is not much better either, she has no new weakness or numbness she feels as if the leg is about the same as yesterday.  Her pain to the left medial leg near the knee and proximal calf is mild to moderate and not severe and nonradiating.  Past Medical History  Diagnosis Date  . Coronary artery disease   . Diabetes mellitus   . Hypertension   . Arthritis   . Drug abuse     History reviewed. No pertinent past surgical history.  History reviewed. No pertinent family history.  History  Substance Use Topics  . Smoking status: Current Everyday Smoker -- 1.0 packs/day  .  Smokeless tobacco: Not on file  . Alcohol Use: No    OB History    Grav Para Term Preterm Abortions TAB SAB Ect Mult Living                  Review of Systems  Constitutional: Negative for fever.       10 Systems reviewed and are negative for acute change except as noted in the HPI.  HENT: Negative for congestion.   Eyes: Negative for discharge and redness.  Respiratory: Negative for cough and shortness of breath.   Cardiovascular: Positive for leg swelling. Negative for chest pain.  Gastrointestinal: Negative for vomiting and abdominal pain.  Musculoskeletal: Negative for back pain.  Skin: Positive for rash.  Neurological: Negative for syncope, numbness and headaches.  Psychiatric/Behavioral:       No behavior change.    Allergies  Gabapentin; Lyrica; and Vicodin  Home Medications   Current Outpatient Rx  Name Route Sig Dispense Refill  . ARIPIPRAZOLE 2 MG PO TABS Oral Take 2 mg by mouth every morning.      . ASPIRIN 81 MG PO CHEW Oral Chew 81 mg by mouth daily.      . BUPROPION HCL ER (XL) 150 MG PO TB24 Oral Take 150 mg by mouth daily.      [name] CARISOPRODOL 350 MG PO TABS Oral Take 350 mg by mouth 3 (three) times daily as needed. For muscle spasms     .  CITALOPRAM HYDROBROMIDE 40 MG PO TABS Oral Take 40 mg by mouth daily.      [name] CLINDAMYCIN HCL 300 MG PO CAPS Oral Take 1 capsule (300 mg total) by mouth 4 (four) times daily. X 7 days 28 capsule 0  . ESTROGENS CONJUGATED 0.625 MG PO TABS Oral Take 0.625 mg by mouth daily.      . FUROSEMIDE 20 MG PO TABS Oral Take 20 mg by mouth daily.      [name] GABAPENTIN 300 MG PO CAPS Oral Take 300 mg by mouth 3 (three) times daily.      [name] HYDROCHLOROTHIAZIDE 25 MG PO TABS Oral Take 25 mg by mouth daily.      [name] HYDROCODONE-ACETAMINOPHEN 10-500 MG PO TABS Oral Take 1 tablet by mouth every 4 (four) hours as needed. For pain     . INSULIN GLARGINE 100 UNIT/ML [place] SOLN Subcutaneous Inject 20 Units into the skin 2 (two) times daily.      [name]  MEDROXYPROGESTERONE ACETATE 2.5 MG PO TABS Oral Take 2.5 mg by mouth daily.      [name] METFORMIN HCL 1000 MG PO TABS Oral Take 1,000 mg by mouth 2 (two) times daily with a meal.      . METHADONE HCL 10 MG PO TABS Oral Take 100 mg by mouth daily.      [name] MIRABEGRON ER 25 MG PO TB24 Oral Take 1 tablet by mouth daily.      . ADULT MULTIVITAMIN W/MINERALS CH Oral Take 1 tablet by mouth daily.      [name] NABUMETONE 750 MG PO TABS Oral Take 750 mg by mouth 2 (two) times daily.        BP 110/68  Pulse 81  Temp(Src) 97.9 F (36.6 C) (Oral)  Resp 17  SpO2 100%  Physical Exam Physical Exam  Nursing note and vitals reviewed.  Constitutional:  Awake, alert, nontoxic appearance.  HENT:  Head: Atraumatic.  Eyes: Right eye exhibits no discharge. Left eye exhibits no discharge.  Neck: Neck supple.  Cardiovascular: Normal rate and regular rhythm.  No murmur heard.  Pulmonary/Chest: Effort normal and breath sounds normal. No respiratory distress. She has no wheezes. She has no rales. She exhibits no tenderness.  Abdominal: Soft. There is no tenderness. There is no rebound.  Musculoskeletal: She exhibits edema and tenderness.  Baseline ROM, no obvious new focal weakness. Her arms and right leg are nontender. Her left leg has mild erythema and tenderness and edema from the medial ankle to the medial knee region consistent with mild localized cellulitis without subcutaneous emphysema or severe tenderness or swelling and I doubt compartment syndrome or necrotizing fasciitis. She is baseline light touch to her left foot with capillary refill less than 2 seconds in all toes.  Neurological:  Mental status and motor strength appears baseline for patient and situation.  Skin: No rash noted.  Psychiatric: She has a normal mood and affect.   ED Course  Procedures (including critical care time)  Labs Reviewed - No data to display No results found.   1. Cellulitis       MDM  I doubt any other EMC precluding  discharge at this time including, but not necessarily limited to the following:sepsis, DVT, nec fasc, compartment syndrome.        Hurman Horn, MD 04/06/11 2133

## 2011-04-06 NOTE — ED Notes (Signed)
Pt states understanding of discharge instructions 

## 2011-07-04 ENCOUNTER — Observation Stay (HOSPITAL_COMMUNITY)
Admission: EM | Admit: 2011-07-04 | Discharge: 2011-07-05 | Disposition: A | Discharge status: Home / Self Care | Payer: PRIVATE HEALTH INSURANCE | Source: Ambulatory Visit | Attending: Emergency Medicine | Admitting: Emergency Medicine

## 2011-07-04 ENCOUNTER — Encounter (HOSPITAL_COMMUNITY): Payer: Self-pay | Admitting: *Deleted

## 2011-07-04 DIAGNOSIS — M7989 Other specified soft tissue disorders: Secondary | ICD-10-CM | POA: Insufficient documentation

## 2011-07-04 DIAGNOSIS — E119 Type 2 diabetes mellitus without complications: Secondary | ICD-10-CM | POA: Insufficient documentation

## 2011-07-04 DIAGNOSIS — M79609 Pain in unspecified limb: Principal | ICD-10-CM | POA: Insufficient documentation

## 2011-07-04 DIAGNOSIS — I2510 Atherosclerotic heart disease of native coronary artery without angina pectoris: Secondary | ICD-10-CM | POA: Insufficient documentation

## 2011-07-04 DIAGNOSIS — I10 Essential (primary) hypertension: Secondary | ICD-10-CM | POA: Insufficient documentation

## 2011-07-04 DIAGNOSIS — F17200 Nicotine dependence, unspecified, uncomplicated: Secondary | ICD-10-CM | POA: Insufficient documentation

## 2011-07-04 DIAGNOSIS — L0390 Cellulitis, unspecified: Secondary | ICD-10-CM

## 2011-07-04 LAB — DIFFERENTIAL
Basophils Absolute: 0 10*3/uL (ref 0.0–0.1)
Basophils Relative: 0 % (ref 0–1)
Eosinophils Absolute: 0.1 10*3/uL (ref 0.0–0.7)
Eosinophils Relative: 1 % (ref 0–5)
Lymphocytes Relative: 42 % (ref 12–46)
Lymphs Abs: 3.5 10*3/uL (ref 0.7–4.0)
Monocytes Absolute: 0.5 10*3/uL (ref 0.1–1.0)
Monocytes Relative: 5 % (ref 3–12)
Neutro Abs: 4.3 10*3/uL (ref 1.7–7.7)
Neutrophils Relative %: 51 % (ref 43–77)

## 2011-07-04 LAB — BASIC METABOLIC PANEL
BUN: 9 mg/dL (ref 6–23)
CO2: 26 mEq/L (ref 19–32)
Calcium: 9.1 mg/dL (ref 8.4–10.5)
Chloride: 96 mEq/L (ref 96–112)
Creatinine, Ser: 0.92 mg/dL (ref 0.50–1.10)
GFR calc Af Amer: 80 mL/min — ABNORMAL LOW (ref >90)
GFR calc non Af Amer: 69 mL/min — ABNORMAL LOW (ref >90)
Glucose, Bld: 161 mg/dL — ABNORMAL HIGH (ref 70–99)
Potassium: 3.1 mEq/L — ABNORMAL LOW (ref 3.5–5.1)
Sodium: 133 mEq/L — ABNORMAL LOW (ref 135–145)

## 2011-07-04 LAB — CBC
HCT: 36.8 % (ref 36.0–46.0)
Hemoglobin: 11.8 g/dL — ABNORMAL LOW (ref 12.0–15.0)
MCH: 26.7 pg (ref 26.0–34.0)
MCHC: 32.1 g/dL (ref 30.0–36.0)
MCV: 83.3 fL (ref 78.0–100.0)
Platelets: 177 10*3/uL (ref 150–400)
RBC: 4.42 MIL/uL (ref 3.87–5.11)
RDW: 12.6 % (ref 11.5–15.5)
WBC: 8.4 10*3/uL (ref 4.0–10.5)

## 2011-07-04 MED ORDER — DEXTROSE 5 % IV SOLN
1.0000 g | Freq: Once | INTRAVENOUS | Status: AC
  Administered 2011-07-04: 1 g via INTRAVENOUS
  Filled 2011-07-04: qty 10

## 2011-07-04 MED ORDER — VANCOMYCIN HCL IN DEXTROSE 1-5 GM/200ML-% IV SOLN
1000.0000 mg | Freq: Two times a day (BID) | INTRAVENOUS | Status: DC
  Administered 2011-07-05: 1000 mg via INTRAVENOUS
  Filled 2011-07-04: qty 200

## 2011-07-04 MED ORDER — MORPHINE SULFATE 4 MG/ML IJ SOLN
4.0000 mg | Freq: Once | INTRAMUSCULAR | Status: AC
  Administered 2011-07-04: 4 mg via INTRAVENOUS
  Filled 2011-07-04: qty 1

## 2011-07-04 MED ORDER — VANCOMYCIN HCL IN DEXTROSE 1-5 GM/200ML-% IV SOLN
1000.0000 mg | Freq: Once | INTRAVENOUS | Status: AC
  Administered 2011-07-04: 1000 mg via INTRAVENOUS
  Filled 2011-07-04: qty 200

## 2011-07-04 NOTE — ED Notes (Signed)
Pt eating dinner.  States pain 8/10.  Minimal redness noted, though edema still present.

## 2011-07-04 NOTE — ED Notes (Signed)
Called to check with pharmacy about dosing pt second dose of vanc

## 2011-07-04 NOTE — ED Notes (Signed)
Meal ordred for pt

## 2011-07-04 NOTE — ED Notes (Signed)
To ed for eval of redness and pain to both lower legs. Seen for same in Jan and dx with cellulitis.

## 2011-07-04 NOTE — ED Provider Notes (Signed)
Patient with a hx sig for diabetes, cellulitis, CAD, and hypertension was placed in CDU on cellulitis protocol by Dr. Rosalia Hammers. Patient care resumed from stretcher triage .  Patient is here for IV anabiotics. Patient re-evaluated and is resting comfortable, VSS, with no new complaints or concerns at this time. . On exam: hemodynamically stable, NAD, heart w/ RRR, lungs CTAB, Chest & abd non-tender. 2+ pitting edema, warmth erythema and tenderness bilateral lower extremities.   2:20 PM Patient care to be resumed by Doran Durand, PA-C. Patient history has been discussed with midlevel resuming care. Disposition will be determined once results obtained. Patient is currently asymptomatic, in NAD, VSS, and has no current complaints.       Rose [name], [place] 07/04/11 1421

## 2011-07-04 NOTE — ED Provider Notes (Signed)
2:30 PM Patient care assumed in the CDU from [facility], [place].  Pt is here on Cellulitis protocol.  Per Earlie Raveling, plan is for patient to receive 2 rounds of IV antibiotics and be discharged home.  Patient was ordered IV Vancomycin and IV Rocephin.  Patient alert and orientated x 3, Heart RRR, Lungs CTAB, bilateral lower extremity edema and erythema.  Patient awaiting second round of IV antibiotics.  6:00 PM Patient not in any acute distress.  Patient reports that the area of erythema and swelling is improving.  Nurse has consulted Pharmacy regarding Vancomycin dose.  Pharmacist put in orders for patient to have her 2nd dose of Vancomycin at 2 AM on 07/05/11.    9:00 PM Patient is not in any acute distress.  Alert.  Patient is complaining of lower extremity pain.  Will order pain medications.    12:14 AM Reassessed patient.  Patient not in any acute distress.  A & O x 3, Heart RRR, Lungs CTAB, bilateral lower extremity edema and erythema improving.   Bilateral lower extremity edema 1+ bilaterally around the ankles.  Pascal Lux [place], PA-C 07/05/11 [phone]

## 2011-07-04 NOTE — ED Provider Notes (Signed)
History     CSN: [id number]  Arrival date & time 07/04/11  1114   First MD Initiated Contact with Patient 07/04/11 1153      Chief Complaint  Patient presents with  . Leg Pain    (Consider location/radiation/quality/duration/timing/severity/associated sxs/prior treatment) HPI Patient states she has history of diabetic neuropathy and a history of cellulitis in legs and now both lower extremities are painful for the past several days.  Both are red and have swelling.  She denies injury or fever.  PMD is [facility].  Denies history of dvt, sob, or chest pain.   Past Medical History  Diagnosis Date  . Coronary artery disease   . Diabetes mellitus   . Hypertension   . Arthritis   . Drug abuse   . Cellulitis     History reviewed. No pertinent past surgical history.  History reviewed. No pertinent family history.  History  Substance Use Topics  . Smoking status: Current Everyday Smoker -- 1.0 packs/day  . Smokeless tobacco: Not on file  . Alcohol Use: No    OB History    Grav Para Term Preterm Abortions TAB SAB Ect Mult Living                  Review of Systems  All other systems reviewed and are negative.    Allergies  Gabapentin; Lyrica; and Vicodin  Home Medications   Current Outpatient Rx  Name Route Sig Dispense Refill  . ASPIRIN 81 MG PO CHEW Oral Chew 81 mg by mouth daily.      . BUPROPION HCL ER (XL) 150 MG PO TB24 Oral Take 150 mg by mouth daily.      [name] CITALOPRAM HYDROBROMIDE 40 MG PO TABS Oral Take 40 mg by mouth daily.      [name] ESTROGENS CONJUGATED 0.625 MG PO TABS Oral Take 0.625 mg by mouth daily.      . FUROSEMIDE 20 MG PO TABS Oral Take 20 mg by mouth daily.      [name] HYDROCHLOROTHIAZIDE 25 MG PO TABS Oral Take 25 mg by mouth daily.      . INSULIN GLARGINE 100 UNIT/ML [place] SOLN Subcutaneous Inject 20 Units into the skin 2 (two) times daily.      [name] MEDROXYPROGESTERONE ACETATE 2.5 MG PO TABS Oral Take 2.5 mg by mouth daily.      [name] METFORMIN HCL 1000 MG  PO TABS Oral Take 1,000 mg by mouth 2 (two) times daily with a meal.      . METHADONE HCL 10 MG PO TABS Oral Take 100 mg by mouth daily.      [name] MIRABEGRON ER 25 MG PO TB24 Oral Take 1 tablet by mouth daily.      . ADULT MULTIVITAMIN W/MINERALS CH Oral Take 1 tablet by mouth daily.        BP 104/67  Pulse 91  Temp(Src) 98.3 F (36.8 C) (Oral)  Resp 16  SpO2 99%  Physical Exam  Nursing note and vitals reviewed. Constitutional: She is oriented to person, place, and time. She appears well-developed and well-nourished.  HENT:  Head: Normocephalic and atraumatic.  Eyes: Conjunctivae and EOM are normal. Pupils are equal, round, and reactive to light.  Neck: Normal range of motion. Neck supple.  Cardiovascular: Normal rate.   Pulmonary/Chest: Effort normal and breath sounds normal.  Abdominal: Soft. Bowel sounds are normal.  Musculoskeletal:       Bilateral lower extremity erythema 2/3 ups calves with some edema- lateralized  swelling.    Neurological: She is alert and oriented to person, place, and time.  Skin: Skin is warm and dry.  Psychiatric: She has a normal mood and affect.    ED Course  Procedures (including critical care time)  Labs Reviewed - No data to display No results found.   No diagnosis found.    MDM  Care discussed with Ms. Drue Novel and patient transferred to CDU on cellulitis protocol.         Hilario Quarry, MD 07/04/11 [phone]

## 2011-07-04 NOTE — ED Notes (Signed)
Presents with bilateral swelling of the lower extremities with assoc errythema. Pt hx of cellulitis in both extremities in January. States sx today feel similar.

## 2011-07-04 NOTE — ED Notes (Signed)
REDNESS TO LOWER LEGS HAS NOT WORSENED. SWELLING TO FEET HAS DECREASED.

## 2011-07-04 NOTE — ED Notes (Signed)
Pharmacy at bedside to dose second dose of vanc

## 2011-07-04 NOTE — Progress Notes (Signed)
ANTIBIOTIC CONSULT NOTE - INITIAL  Pharmacy Consult for vancomycin Indication: lower extremity cellulitis  Allergies  Allergen Reactions  . Gabapentin     Only when takes over 300 mg dose.   . Lyrica Other (See Comments)    Muscle spasms  . Vicodin (Hydrocodone-Acetaminophen) Hives    Patient Measurements: Height: 5\' 3"  (160 cm) Weight: 178 lb (80.74 kg) (per patient) IBW/kg (Calculated) : 52.4   Vital Signs: Temp: 98.1 F (36.7 C) (04/05 1527) Temp src: Oral (04/05 1527) BP: 128/74 mmHg (04/05 1700) Pulse Rate: 81  (04/05 1700) Labs:  Basename 07/04/11 1240  WBC 8.4  HGB 11.8*  PLT 177  LABCREA --  CREATININE 0.92   Estimated Creatinine Clearance: 70.3 ml/min (by C-G formula based on Cr of 0.92). No results found for this basename: VANCOTROUGH:2,VANCOPEAK:2,VANCORANDOM:2,GENTTROUGH:2,GENTPEAK:2,GENTRANDOM:2,TOBRATROUGH:2,TOBRAPEAK:2,TOBRARND:2,AMIKACINPEAK:2,AMIKACINTROU:2,AMIKACIN:2, in the last 72 hours   Microbiology: No results found for this or any previous visit (from the past 720 hour(s)).  Medical History: Past Medical History  Diagnosis Date  . Coronary artery disease   . Diabetes mellitus   . Hypertension   . Arthritis   . Drug abuse   . Cellulitis     Medications:  Prior to Admission medications   Medication Sig Start Date End Date Taking? Authorizing Provider  aspirin 81 MG chewable tablet Chew 81 mg by mouth daily.     Yes Historical Provider, MD  buPROPion (WELLBUTRIN XL) 150 MG 24 hr tablet Take 150 mg by mouth daily.     Yes Historical Provider, MD  citalopram (CELEXA) 40 MG tablet Take 40 mg by mouth daily.     Yes Historical Provider, MD  estrogens, conjugated, (PREMARIN) 0.625 MG tablet Take 0.625 mg by mouth daily.     Yes Historical Provider, MD  furosemide (LASIX) 20 MG tablet Take 20 mg by mouth daily.     Yes Historical Provider, MD  hydrochlorothiazide (HYDRODIURIL) 25 MG tablet Take 25 mg by mouth daily.     Yes Historical  Provider, MD  insulin glargine (LANTUS) 100 UNIT/ML injection Inject 20 Units into the skin 2 (two) times daily.     Yes Historical Provider, MD  medroxyPROGESTERone (PROVERA) 2.5 MG tablet Take 2.5 mg by mouth daily.     Yes Historical Provider, MD  metFORMIN (GLUCOPHAGE) 1000 MG tablet Take 1,000 mg by mouth 2 (two) times daily with a meal.     Yes Historical Provider, MD  methadone (DOLOPHINE) 10 MG tablet Take 100 mg by mouth daily.     Yes Historical Provider, MD  Mirabegron ER (MYRBETRIQ) 25 MG TB24 Take 1 tablet by mouth daily.     Yes Historical Provider, MD  Multiple Vitamin (MULITIVITAMIN WITH MINERALS) TABS Take 1 tablet by mouth daily.     Yes Historical Provider, MD    Assessment: 56 year old female with lower extremity cellulitis/redness, started on vancomycin and ceftriaxone earlier today. Wbc and renal function within normal limits, no fevers noted here in CDU.   Goal of Therapy:  Vancomycin trough 10-15  Plan:  Vancomycin 1000mg  IV q 12 hours - next dose due ~ 07/05/11 @ 0200 If patient admitted and length of therapy expected to be >5 days will check trough Follow renal function for changes  Rose [name] 07/04/2011,5:44 PM

## 2011-07-05 MED ORDER — DOXYCYCLINE HYCLATE 100 MG PO CAPS: 100.0000 mg | ORAL_CAPSULE | Freq: Two times a day (BID) | ORAL | Status: AC

## 2011-07-05 NOTE — Discharge Instructions (Signed)

## 2011-07-05 NOTE — ED Provider Notes (Signed)
I saw and did full evaluation of patient and placed on cellulitits protocol.   Hilario Quarry, MD 07/05/11 [phone]

## 2011-07-05 NOTE — ED Provider Notes (Signed)
I saw and evaluated this patient- please see my full h and p.   Hilario Quarry, MD 07/05/11 [phone]

## 2011-07-10 LAB — CULTURE, BLOOD (ROUTINE X 2)
Culture  Setup Time: 201304051632
Culture  Setup Time: 201304051632
Culture: NO GROWTH
Culture: NO GROWTH

## 2011-09-09 ENCOUNTER — Other Ambulatory Visit: Payer: Self-pay | Admitting: Urology

## 2011-09-30 ENCOUNTER — Emergency Department (HOSPITAL_COMMUNITY)
Admission: EM | Admit: 2011-09-30 | Discharge: 2011-09-30 | Disposition: A | Discharge status: Home / Self Care | Payer: PRIVATE HEALTH INSURANCE | Attending: Emergency Medicine | Admitting: Emergency Medicine

## 2011-09-30 ENCOUNTER — Encounter (HOSPITAL_COMMUNITY): Payer: Self-pay | Admitting: Emergency Medicine

## 2011-09-30 DIAGNOSIS — M129 Arthropathy, unspecified: Secondary | ICD-10-CM | POA: Insufficient documentation

## 2011-09-30 DIAGNOSIS — E119 Type 2 diabetes mellitus without complications: Secondary | ICD-10-CM | POA: Insufficient documentation

## 2011-09-30 DIAGNOSIS — Z79899 Other long term (current) drug therapy: Secondary | ICD-10-CM | POA: Insufficient documentation

## 2011-09-30 DIAGNOSIS — M79609 Pain in unspecified limb: Secondary | ICD-10-CM | POA: Insufficient documentation

## 2011-09-30 DIAGNOSIS — I10 Essential (primary) hypertension: Secondary | ICD-10-CM | POA: Insufficient documentation

## 2011-09-30 DIAGNOSIS — F17200 Nicotine dependence, unspecified, uncomplicated: Secondary | ICD-10-CM | POA: Insufficient documentation

## 2011-09-30 DIAGNOSIS — M545 Low back pain, unspecified: Secondary | ICD-10-CM | POA: Insufficient documentation

## 2011-09-30 DIAGNOSIS — M79606 Pain in leg, unspecified: Secondary | ICD-10-CM

## 2011-09-30 DIAGNOSIS — Z794 Long term (current) use of insulin: Secondary | ICD-10-CM | POA: Insufficient documentation

## 2011-09-30 DIAGNOSIS — I2510 Atherosclerotic heart disease of native coronary artery without angina pectoris: Secondary | ICD-10-CM | POA: Insufficient documentation

## 2011-09-30 MED ORDER — CEPHALEXIN 500 MG PO CAPS: 500.0000 mg | ORAL_CAPSULE | Freq: Four times a day (QID) | ORAL | Status: AC

## 2011-09-30 NOTE — ED Notes (Signed)
Pt reports 2 weeks of pain in left leg. Pt has history of cellulitis and comes to hospital to get IV antibiotics. Today pt tried lifting brother who weighs about 300 lbs and now have pain to low back.

## 2011-09-30 NOTE — ED Provider Notes (Signed)
History     CSN: [id number]  Arrival date & time 09/30/11  1330   First MD Initiated Contact with Patient 09/30/11 1424      Chief Complaint  Patient presents with  . Back Pain    low back  . Leg Pain    left leg    (Consider location/radiation/quality/duration/timing/severity/associated sxs/prior treatment) HPI Pt reports 2 weeks of increasing pain and swelling to her L lower leg. She has had several prior episodes of cellulitis to that area. Pain is moderate and burning. She also has history of neuropathy. She denies any drainage or fever. She did not mention back pain as noted in nursing notes.  Past Medical History  Diagnosis Date  . Coronary artery disease   . Diabetes mellitus   . Hypertension   . Arthritis   . Drug abuse   . Cellulitis     History reviewed. No pertinent past surgical history.  History reviewed. No pertinent family history.  History  Substance Use Topics  . Smoking status: Current Everyday Smoker -- 1.0 packs/day  . Smokeless tobacco: Not on file  . Alcohol Use: No    OB History    Grav Para Term Preterm Abortions TAB SAB Ect Mult Living                  Review of Systems All other systems reviewed and are negative except as noted in HPI.   Allergies  Gabapentin; Pregabalin; and Vicodin  Home Medications   Current Outpatient Rx  Name Route Sig Dispense Refill  . ASPIRIN 81 MG PO CHEW Oral Chew 81 mg by mouth daily.      . BUPROPION HCL ER (XL) 150 MG PO TB24 Oral Take 150 mg by mouth daily.      [name] CITALOPRAM HYDROBROMIDE 40 MG PO TABS Oral Take 40 mg by mouth daily.      [name] ESTROGENS CONJUGATED 0.625 MG PO TABS Oral Take 0.625 mg by mouth daily.      . FUROSEMIDE 20 MG PO TABS Oral Take 20 mg by mouth daily.      [name] HYDROCHLOROTHIAZIDE 25 MG PO TABS Oral Take 25 mg by mouth daily.     . INSULIN GLARGINE 100 UNIT/ML [place] SOLN Subcutaneous Inject 20 Units into the skin 2 (two) times daily.     [name] MEDROXYPROGESTERONE ACETATE 2.5 MG PO  TABS Oral Take 2.5 mg by mouth daily.      [name] METFORMIN HCL 1000 MG PO TABS Oral Take 1,000 mg by mouth 2 (two) times daily with a meal.      . METHADONE HCL 10 MG PO TABS Oral Take 100 mg by mouth daily.      [name] MIRABEGRON ER 25 MG PO TB24 Oral Take 1 tablet by mouth daily.      . ADULT MULTIVITAMIN W/MINERALS CH Oral Take 1 tablet by mouth daily.        LMP 07/31/2011  Physical Exam  Nursing note and vitals reviewed. Constitutional: She is oriented to person, place, and time. She appears well-developed and well-nourished.  HENT:  Head: Normocephalic and atraumatic.  Eyes: EOM are normal. Pupils are equal, round, and reactive to light.  Neck: Normal range of motion. Neck supple.  Cardiovascular: Normal rate, normal heart sounds and intact distal pulses.   Pulmonary/Chest: Effort normal and breath sounds normal.  Abdominal: Bowel sounds are normal. She exhibits no distension. There is no tenderness.  Musculoskeletal: Normal range of motion. She exhibits edema.  She exhibits no tenderness.       Mild edema of the LE bilaterally. Minimal erythema, no warmth or drainage  Neurological: She is alert and oriented to person, place, and time. She has normal strength. No cranial nerve deficit or sensory deficit.  Skin: Skin is warm and dry. No rash noted.  Psychiatric: She has a normal mood and affect.    ED Course  Procedures (including critical care time)  Labs Reviewed - No data to display No results found.   No diagnosis found.    MDM  Possible early cellulitis although symptoms more consistent with progressive neuropathy. Pt would like Abx just in case.         Charles B. Bernette Mayers, MD 09/30/11 1452

## 2011-09-30 NOTE — ED Notes (Addendum)
Pt states "I've been over here a few times for this leg, I've had it since February and it's gotten a little better but I need antibiotics." Left leg is red and mildly swollen. Pt states that her brother fell on her today and its giving her lower back pain.

## 2011-10-20 ENCOUNTER — Encounter (HOSPITAL_BASED_OUTPATIENT_CLINIC_OR_DEPARTMENT_OTHER): Payer: Self-pay | Admitting: *Deleted

## 2011-10-20 NOTE — Progress Notes (Signed)
NPO AFTER MN. ARRIVES AT 0800. NEEDS ISTAT AND EKG. WILL TAKE METHADONE AM OF SURG W/ SIP OF WATER. 

## 2011-10-22 NOTE — H&P (Signed)
History of Present Illness   Rose [name] did beautifully with the PNE. She says the right side works the best. She was completely dry for 2 days. She had a lot less frequency. She has failed multiple medications. She is an insulin dependent diabetic. She would like to proceed with Interstim and I answered more questions. Review of systems: No change in bowel or neurologic status.    Past Medical History Problems  1. History of  Anxiety (Symptom) 300.00 2. History of  Arthritis V13.4 3. History of  Asthma 493.90 4. History of  Depression 311 5. History of  Diabetes Mellitus 250.00 6. History of  Hypertension 401.9 7. History of  Peripheral Neuropathy 356.9 8. History of  Thrombophlebitis Of Deep Vessels Of The Lower Extremity V12.51  Surgical History Problems  1. History of  Incision And Drainage Of Pilonidal Cyst  Current Meds 1. BuPROPion HCl ER (XL) 300 MG Oral Tablet Extended Release 24 Hour; Therapy: 15May2013 to 2. CeleXA TABS; Therapy: (Recorded:01Aug2012) to 3. Cymbalta 60 MG Oral Capsule Delayed Release Particles; Therapy: 29Jan2013 to 4. Furosemide 20 MG Oral Tablet; Therapy: (Recorded:01Feb2012) to 5. Hydrochlorothiazide 25 MG Oral Tablet; Therapy: (Recorded:01Feb2012) to 6. Lantus SOLN; Therapy: (Recorded:01Feb2012) to 7. MetFORMIN HCl 1000 MG Oral Tablet; Therapy: (Recorded:01Feb2012) to 8. Potassium Chloride ER 10 MEQ Oral Tablet Extended Release; Therapy: 15May2013 to 9. Premarin 0.625 MG Oral Tablet; Therapy: (Recorded:01Feb2012) to 10. Provera 2.5 MG Oral Tablet; Therapy: (Recorded:01Feb2012) to 11. Zyrtec 10 MG TABS; Therapy: (Recorded:01Feb2012) to  Allergies Medication  1. Gabapentin TABS  Family History Problems  1. Maternal history of  Cardiac Failure 2. Paternal history of  Cardiac Failure 3. Paternal history of  Death In The Family Father 58- heart failure 4. Maternal history of  Death In The Family Mother 23- Heart failure 5. Family history of   Family Health Status Number Of Children 3 daughters  Social History Problems  1. Caffeine Use 1 per day 2. Marital History - Single 3. Tobacco Use V15.82 smokes 1/2 ppd for 25 yrs 4. Unemployed Denied  5. History of  Alcohol Use  Vitals Vital Signs [Data Includes: Last 1 Day]  10Jun2013 11:00AM  BMI Calculated: 31.89 BSA Calculated: 1.85 Weight: 180 lb  Blood Pressure: 133 / 80 Temperature: 98.1 F Heart Rate: 83  Assessment Assessed  1. Urinary Incontinence Without Sensory Awareness 788.34 2. Urinary Frequency 788.41 3. Urge And Stress Incontinence 788.33  Plan Urge And Stress Incontinence (788.33)  1. Follow-up Schedule Surgery Office  Follow-up  Done: 10Jun2013  Discussion/Summary   We are so pleased that Rose [name] was dry for 2 days. She was leaking so much without awareness and had a lot of frequency and urgency. We will proceed accordingly with _____   The right side worked the best for Rose [name].   After a thorough review of the management options for the patient's condition the patient  elected to proceed with surgical therapy as noted above. We have discussed the potential benefits and risks of the procedure, side effects of the proposed treatment, the likelihood of the patient achieving the goals of the procedure, and any potential problems that might occur during the procedure or recuperation. Informed consent has been obtained.

## 2011-10-23 ENCOUNTER — Encounter (HOSPITAL_BASED_OUTPATIENT_CLINIC_OR_DEPARTMENT_OTHER): Payer: Self-pay | Admitting: Anesthesiology

## 2011-10-23 ENCOUNTER — Ambulatory Visit (HOSPITAL_BASED_OUTPATIENT_CLINIC_OR_DEPARTMENT_OTHER): Payer: PRIVATE HEALTH INSURANCE | Admitting: Anesthesiology

## 2011-10-23 ENCOUNTER — Encounter (HOSPITAL_BASED_OUTPATIENT_CLINIC_OR_DEPARTMENT_OTHER): Payer: Self-pay | Admitting: *Deleted

## 2011-10-23 ENCOUNTER — Ambulatory Visit (HOSPITAL_COMMUNITY): Payer: PRIVATE HEALTH INSURANCE

## 2011-10-23 ENCOUNTER — Ambulatory Visit (HOSPITAL_BASED_OUTPATIENT_CLINIC_OR_DEPARTMENT_OTHER)
Admission: RE | Admit: 2011-10-23 | Discharge: 2011-10-23 | Disposition: A | Discharge status: Home / Self Care | Payer: PRIVATE HEALTH INSURANCE | Source: Ambulatory Visit | Attending: Urology | Admitting: Urology

## 2011-10-23 ENCOUNTER — Encounter (HOSPITAL_BASED_OUTPATIENT_CLINIC_OR_DEPARTMENT_OTHER)
Admission: RE | Disposition: A | Discharge status: Home / Self Care | Payer: Self-pay | Source: Ambulatory Visit | Attending: Urology

## 2011-10-23 DIAGNOSIS — Z86718 Personal history of other venous thrombosis and embolism: Secondary | ICD-10-CM | POA: Insufficient documentation

## 2011-10-23 DIAGNOSIS — N3946 Mixed incontinence: Secondary | ICD-10-CM | POA: Insufficient documentation

## 2011-10-23 DIAGNOSIS — G609 Hereditary and idiopathic neuropathy, unspecified: Secondary | ICD-10-CM | POA: Insufficient documentation

## 2011-10-23 DIAGNOSIS — Z794 Long term (current) use of insulin: Secondary | ICD-10-CM | POA: Insufficient documentation

## 2011-10-23 DIAGNOSIS — J45909 Unspecified asthma, uncomplicated: Secondary | ICD-10-CM | POA: Insufficient documentation

## 2011-10-23 DIAGNOSIS — N3942 Incontinence without sensory awareness: Secondary | ICD-10-CM | POA: Insufficient documentation

## 2011-10-23 DIAGNOSIS — I10 Essential (primary) hypertension: Secondary | ICD-10-CM | POA: Insufficient documentation

## 2011-10-23 DIAGNOSIS — Z79899 Other long term (current) drug therapy: Secondary | ICD-10-CM | POA: Insufficient documentation

## 2011-10-23 DIAGNOSIS — E119 Type 2 diabetes mellitus without complications: Secondary | ICD-10-CM | POA: Insufficient documentation

## 2011-10-23 DIAGNOSIS — N3941 Urge incontinence: Secondary | ICD-10-CM | POA: Insufficient documentation

## 2011-10-23 HISTORY — DX: Depression, unspecified: F32.A

## 2011-10-23 HISTORY — DX: Localized edema: R60.0

## 2011-10-23 HISTORY — DX: Major depressive disorder, single episode, unspecified: F32.9

## 2011-10-23 HISTORY — DX: Other chronic pain: G89.29

## 2011-10-23 HISTORY — DX: Nocturia: R35.1

## 2011-10-23 HISTORY — DX: Dorsalgia, unspecified: M54.9

## 2011-10-23 HISTORY — DX: Frequency of micturition: R35.0

## 2011-10-23 HISTORY — DX: Other psychoactive substance abuse, in remission: F19.11

## 2011-10-23 HISTORY — DX: Opioid dependence, uncomplicated: F11.20

## 2011-10-23 HISTORY — DX: Personal history of other diseases of the respiratory system: Z87.09

## 2011-10-23 HISTORY — DX: Unspecified asthma, uncomplicated: J45.909

## 2011-10-23 HISTORY — DX: Urge incontinence: N39.41

## 2011-10-23 HISTORY — PX: INTERSTIM IMPLANT PLACEMENT: SHX5130

## 2011-10-23 HISTORY — DX: Anxiety disorder, unspecified: F41.9

## 2011-10-23 HISTORY — DX: Enthesopathy, unspecified: M77.9

## 2011-10-23 LAB — POCT I-STAT 4, (NA,K, GLUC, HGB,HCT)
Glucose, Bld: 102 mg/dL — ABNORMAL HIGH (ref 70–99)
HCT: 43 % (ref 36.0–46.0)
Hemoglobin: 14.6 g/dL (ref 12.0–15.0)
Potassium: 3.8 mEq/L (ref 3.5–5.1)
Sodium: 144 mEq/L (ref 135–145)

## 2011-10-23 SURGERY — INSERTION, SACRAL NERVE STIMULATOR, INTERSTIM, STAGE 1
Anesthesia: Monitor Anesthesia Care | Site: Back | Wound class: Clean

## 2011-10-23 MED ORDER — LACTATED RINGERS IV SOLN
INTRAVENOUS | Status: DC
  Administered 2011-10-23: 13:00:00 via INTRAVENOUS

## 2011-10-23 MED ORDER — CEPHALEXIN 250 MG PO CAPS: 250.0000 mg | ORAL_CAPSULE | Freq: Three times a day (TID) | ORAL | Status: AC

## 2011-10-23 MED ORDER — FENTANYL CITRATE 0.05 MG/ML IJ SOLN
INTRAMUSCULAR | Status: DC | PRN
  Administered 2011-10-23: 50 ug via INTRAVENOUS

## 2011-10-23 MED ORDER — CEFAZOLIN SODIUM 1-5 GM-% IV SOLN
1.0000 g | INTRAVENOUS | Status: AC
  Administered 2011-10-23: 1 g via INTRAVENOUS

## 2011-10-23 MED ORDER — FENTANYL CITRATE 0.05 MG/ML IJ SOLN: 25.0000 ug | INTRAMUSCULAR | Status: DC | PRN

## 2011-10-23 MED ORDER — ONDANSETRON HCL 4 MG/2ML IJ SOLN
INTRAMUSCULAR | Status: DC | PRN
  Administered 2011-10-23: 4 mg via INTRAVENOUS

## 2011-10-23 MED ORDER — MIDAZOLAM HCL 5 MG/5ML IJ SOLN
INTRAMUSCULAR | Status: DC | PRN
  Administered 2011-10-23: 1 mg via INTRAVENOUS

## 2011-10-23 MED ORDER — LIDOCAINE HCL (CARDIAC) 20 MG/ML IV SOLN
INTRAVENOUS | Status: DC | PRN
  Administered 2011-10-23: 50 mg via INTRAVENOUS

## 2011-10-23 MED ORDER — DEXAMETHASONE SODIUM PHOSPHATE 4 MG/ML IJ SOLN
INTRAMUSCULAR | Status: DC | PRN
  Administered 2011-10-23: 4 mg via INTRAVENOUS

## 2011-10-23 MED ORDER — LIDOCAINE-EPINEPHRINE (PF) 1 %-1:200000 IJ SOLN
INTRAMUSCULAR | Status: DC | PRN
  Administered 2011-10-23: 20 mL

## 2011-10-23 MED ORDER — PROMETHAZINE HCL 25 MG/ML IJ SOLN: 6.2500 mg | INTRAMUSCULAR | Status: DC | PRN

## 2011-10-23 MED ORDER — OXYCODONE-ACETAMINOPHEN 10-650 MG PO TABS: 1.0000 | ORAL_TABLET | Freq: Four times a day (QID) | ORAL | Status: AC | PRN

## 2011-10-23 MED ORDER — LACTATED RINGERS IV SOLN
INTRAVENOUS | Status: DC
  Administered 2011-10-23: 10:00:00 via INTRAVENOUS

## 2011-10-23 MED ORDER — GENTAMICIN IN SALINE 1.6-0.9 MG/ML-% IV SOLN
80.0000 mg | INTRAVENOUS | Status: AC
  Administered 2011-10-23: 80 mg via INTRAVENOUS

## 2011-10-23 MED ORDER — PROPOFOL 10 MG/ML IV EMUL
INTRAVENOUS | Status: DC | PRN
  Administered 2011-10-23: 50 ug/kg/min via INTRAVENOUS

## 2011-10-23 MED ORDER — PROPOFOL 10 MG/ML IV EMUL
INTRAVENOUS | Status: DC | PRN
  Administered 2011-10-23: 50 mg via INTRAVENOUS

## 2011-10-23 MED ORDER — BUPIVACAINE-EPINEPHRINE 0.5% -1:200000 IJ SOLN
INTRAMUSCULAR | Status: DC | PRN
  Administered 2011-10-23: 28 mL

## 2011-10-23 SURGICAL SUPPLY — 59 items
ADH SKN CLS APL DERMABOND .7 (GAUZE/BANDAGES/DRESSINGS)
ANTNA NRSTM XTRN TELEM NS LF (UROLOGICAL SUPPLIES) ×1
APL SKNCLS STERI-STRIP NONHPOA (GAUZE/BANDAGES/DRESSINGS) ×2
BAG URINE DRAINAGE (UROLOGICAL SUPPLIES) ×2 IMPLANT
BAG URINE LEG 500ML (DRAIN) IMPLANT
BANDAGE ADHESIVE 1X3 (GAUZE/BANDAGES/DRESSINGS) ×4 IMPLANT
BENZOIN TINCTURE PRP APPL 2/3 (GAUZE/BANDAGES/DRESSINGS) ×4 IMPLANT
BLADE HEX COATED 2.75 (ELECTRODE) ×2 IMPLANT
BLADE SURG 15 STRL LF DISP TIS (BLADE) ×1 IMPLANT
BLADE SURG 15 STRL SS (BLADE) ×2
CATH FOLEY 2WAY SLVR  5CC 16FR (CATHETERS) ×1
CATH FOLEY 2WAY SLVR 5CC 16FR (CATHETERS) ×1 IMPLANT
CLOTH BEACON ORANGE TIMEOUT ST (SAFETY) ×2 IMPLANT
COVER MAYO STAND STRL (DRAPES) ×2 IMPLANT
COVER PROBE W GEL 5X96 (DRAPES) ×2 IMPLANT
COVER TABLE BACK 60X90 (DRAPES) ×2 IMPLANT
DERMABOND ADVANCED (GAUZE/BANDAGES/DRESSINGS)
DERMABOND ADVANCED .7 DNX12 (GAUZE/BANDAGES/DRESSINGS) ×1 IMPLANT
DRAPE C-ARM 42X72 X-RAY (DRAPES) ×2 IMPLANT
DRAPE INCISE 23X17 IOBAN STRL (DRAPES) ×1
DRAPE INCISE 23X17 STRL (DRAPES) ×1 IMPLANT
DRAPE INCISE IOBAN 23X17 STRL (DRAPES) ×1 IMPLANT
DRAPE LAPAROSCOPIC ABDOMINAL (DRAPES) ×2 IMPLANT
DRAPE LG THREE QUARTER DISP (DRAPES) ×2 IMPLANT
DRESSING TELFA 8X3 (GAUZE/BANDAGES/DRESSINGS) ×2 IMPLANT
DRSG TEGADERM 2-3/8X2-3/4 SM (GAUZE/BANDAGES/DRESSINGS) ×1 IMPLANT
DRSG TEGADERM 4X4.75 (GAUZE/BANDAGES/DRESSINGS) ×2 IMPLANT
ELECT REM PT RETURN 9FT ADLT (ELECTROSURGICAL) ×2
ELECTRODE REM PT RTRN 9FT ADLT (ELECTROSURGICAL) ×1 IMPLANT
GAUZE SPONGE 4X4 12PLY STRL LF (GAUZE/BANDAGES/DRESSINGS) ×4 IMPLANT
GLOVE BIO SURGEON STRL SZ 6.5 (GLOVE) ×1 IMPLANT
GLOVE BIO SURGEON STRL SZ7.5 (GLOVE) ×2 IMPLANT
GLOVE ECLIPSE 6.0 STRL STRAW (GLOVE) ×1 IMPLANT
GOWN PREVENTION PLUS LG XLONG (DISPOSABLE) ×2 IMPLANT
GOWN STRL REIN XL XLG (GOWN DISPOSABLE) ×2 IMPLANT
HOLDER FOLEY CATH W/STRAP (MISCELLANEOUS) ×1 IMPLANT
INTRODUCER GUIDE DILATR SHEATH (SET/KITS/TRAYS/PACK) ×2 IMPLANT
LEAD (Lead) ×2 IMPLANT
NEEDLE FORAMEN 20GA 3.5  9CM (NEEDLE) IMPLANT
NEEDLE FORAMEN 20GA 5  12.5CM (NEEDLE) ×1 IMPLANT
NEEDLE HYPO 22GX1.5 SAFETY (NEEDLE) ×2 IMPLANT
PACK BASIN DAY SURGERY FS (CUSTOM PROCEDURE TRAY) ×2 IMPLANT
PENCIL BUTTON HOLSTER BLD 10FT (ELECTRODE) IMPLANT
PROGRAMMER ANTENNA EXT (UROLOGICAL SUPPLIES) ×2 IMPLANT
PROGRAMMER STIMUL 2.2X1.1X3.7 (UROLOGICAL SUPPLIES) ×2 IMPLANT
STAPLER VISISTAT 35W (STAPLE) IMPLANT
STIMULATOR INTERSTIM 2X1.7X.3 (Orthopedic Implant) ×2 IMPLANT
STRIP CLOSURE SKIN 1/2X4 (GAUZE/BANDAGES/DRESSINGS) ×2 IMPLANT
SUT SILK 2 0 (SUTURE) ×2
SUT SILK 2-0 18XBRD TIE 12 (SUTURE) ×1 IMPLANT
SUT VIC AB 3-0 SH 27 (SUTURE) ×4
SUT VIC AB 3-0 SH 27X BRD (SUTURE) ×2 IMPLANT
SUT VICRYL 4-0 PS2 18IN ABS (SUTURE) ×4 IMPLANT
SYR BULB IRRIGATION 50ML (SYRINGE) ×2 IMPLANT
SYR CONTROL 10ML LL (SYRINGE) ×2 IMPLANT
SYRINGE 10CC LL (SYRINGE) ×2 IMPLANT
TOWEL OR 17X24 6PK STRL BLUE (TOWEL DISPOSABLE) ×4 IMPLANT
TRAY DSU PREP LF (CUSTOM PROCEDURE TRAY) ×2 IMPLANT
WATER STERILE IRR 500ML POUR (IV SOLUTION) ×2 IMPLANT

## 2011-10-23 NOTE — Interval H&P Note (Signed)
History and Physical Interval Note:  10/23/2011 7:27 AM  Rose [name]  has presented today for surgery, with the diagnosis of URGE INCONTINENCE AND FREQUENCY  The various methods of treatment have been discussed with the patient and family. After consideration of risks, benefits and other options for treatment, the patient has consented to  Procedure(s) (LRB): INTERSTIM IMPLANT FIRST STAGE (N/A) INTERSTIM IMPLANT SECOND STAGE (N/A) as a surgical intervention .  The patient's history has been reviewed, patient examined, no change in status, stable for surgery.  I have reviewed the patient's chart and labs.  Questions were answered to the patient's satisfaction.     Marlen Mollica A

## 2011-10-23 NOTE — Transfer of Care (Signed)
Immediate Anesthesia Transfer of Care Note  Patient: Rose [name]  Procedure(s) Performed: Procedure(s) (LRB): INTERSTIM IMPLANT FIRST STAGE (N/A) INTERSTIM IMPLANT SECOND STAGE (N/A)  Patient Location: PACU  Anesthesia Type: MAC  Level of Consciousness:drowsy, arouses to name, follows commands  Airway & Oxygen Therapy: Patient Spontanous Breathing and Patient connected to face mask oxygen  Post-op Assessment: Report given to PACU RN and Post -op Vital signs reviewed and stable  Post vital signs: Reviewed and stable  Complications: No apparent anesthesia complications

## 2011-10-23 NOTE — Progress Notes (Signed)
Glen from [employer] into adjust  & set neurostimulator .

## 2011-10-23 NOTE — Anesthesia Preprocedure Evaluation (Addendum)
Anesthesia Evaluation  Patient identified by MRN, date of birth, ID band Patient awake    Reviewed: Allergy & Precautions, H&P , NPO status , Patient's Chart, lab work & pertinent test results  Airway Mallampati: II TM Distance: >3 FB Neck ROM: Full    Dental  (+) Poor Dentition, Partial Upper and Dental Advisory Given   Pulmonary asthma , Current Smoker,  breath sounds clear to auscultation  Pulmonary exam normal       Cardiovascular hypertension, Pt. on medications Rhythm:Regular Rate:Normal     Neuro/Psych Anxiety Depression  Neuromuscular disease    GI/Hepatic negative GI ROS, (+)     substance abuse (Patient states she discontinued herion in 2006; methadone maintenance)  IV drug use,   Endo/Other  Type 2, Insulin Dependent and Oral Hypoglycemic Agents  Renal/GU negative Renal ROS  negative genitourinary   Musculoskeletal negative musculoskeletal ROS (+)   Abdominal   Peds  Hematology negative hematology ROS (+)   Anesthesia Other Findings   Reproductive/Obstetrics negative OB ROS                          Anesthesia Physical Anesthesia Plan  ASA: III  Anesthesia Plan: MAC   Post-op Pain Management:    Induction: Intravenous  Airway Management Planned: Simple Face Mask  Additional Equipment:   Intra-op Plan:   Post-operative Plan:   Informed Consent: I have reviewed the patients History and Physical, chart, labs and discussed the procedure including the risks, benefits and alternatives for the proposed anesthesia with the patient or authorized representative who has indicated his/her understanding and acceptance.   Dental advisory given  Plan Discussed with: CRNA  Anesthesia Plan Comments:         Anesthesia Quick Evaluation

## 2011-10-23 NOTE — Anesthesia Procedure Notes (Signed)
Procedure Name: MAC Date/Time: 10/23/2011 11:15 AM Performed by: Norva Pavlov Pre-anesthesia Checklist: Patient identified, Emergency Drugs available, Suction available, Patient being monitored and Timeout performed Patient Re-evaluated:Patient Re-evaluated prior to inductionIntubation Type: IV induction Airway Equipment and Method: Oral airway Dental Injury: Teeth and Oropharynx as per pre-operative assessment

## 2011-10-23 NOTE — Anesthesia Postprocedure Evaluation (Signed)
  Anesthesia Post-op Note  Patient: Rose [name]  Procedure(s) Performed: Procedure(s) (LRB): INTERSTIM IMPLANT FIRST STAGE (N/A) INTERSTIM IMPLANT SECOND STAGE (N/A)  Patient Location: PACU  Anesthesia Type: General  Level of Consciousness: awake and alert   Airway and Oxygen Therapy: Patient Spontanous Breathing  Post-op Pain: mild  Post-op Assessment: Post-op Vital signs reviewed, Patient's Cardiovascular Status Stable, Respiratory Function Stable, Patent Airway and No signs of Nausea or vomiting  Post-op Vital Signs: stable  Complications: No apparent anesthesia complications

## 2011-10-23 NOTE — Op Note (Signed)
Preoperative diagnosis: Refractory urgency incontinence Postoperative diagnoses refractory urgency incontinence Surgery: Implantation of InterStim stage I and 2 and impedance check Surgeon: Dr. Lorin Picket Shaylynn Nulty  Patient has the above diagnoses. Preoperative antibiotics were given. Using lateral and AP fluoroscopy I easily found the S3 foramina on both sides. 3.5 inch framing needle was utilized. Approximately 15 minutes was utilized to get a good bellows and toe response and it was better on the patient's right side. 1 cm incision was made after passing the introducer guide. I then placed the introducer sheath to the appropriate depth.  The lead was passed to the appropriate depth and she had excellent toe and bellows in all 4 positions. It was at low power. Fluoroscopically are removed the white introducer sheath without change in the position of the lead. AP and lateral views were taken  I made a 4 cm incision in her right buttock at the level of the pocket. I made an appropriate depth subcutaneous pocket. I delivered the lead from medial to lateral with the delivery device. With a screwdriver attached the lead to the IPG. It was placed nicely in the pocket.  The impedance check was done and was normal for all 4-lead positions  Irrigation was utilized. Lateral incision was closed with 3-0 Vicryl subcutaneous followed by 4-0 Vicryl subcuticular. Interrupted 4-0 Vicryl was used for short midline incision. Sterile dressing was applied. I was very pleased with her she

## 2011-10-23 NOTE — H&P (View-Only) (Signed)
NPO AFTER MN. ARRIVES AT 0800. NEEDS ISTAT AND EKG. WILL TAKE METHADONE AM OF SURG W/ SIP OF WATER.

## 2011-10-23 NOTE — Progress Notes (Signed)
Dr Sherron Monday in to see pt as ordered to see pt prior to D/C.

## 2011-10-24 LAB — GLUCOSE, CAPILLARY: Glucose-Capillary: 133 mg/dL — ABNORMAL HIGH (ref 70–99)

## 2011-10-27 ENCOUNTER — Encounter (HOSPITAL_BASED_OUTPATIENT_CLINIC_OR_DEPARTMENT_OTHER): Payer: Self-pay | Admitting: Urology

## 2011-10-28 ENCOUNTER — Encounter (HOSPITAL_BASED_OUTPATIENT_CLINIC_OR_DEPARTMENT_OTHER): Payer: Self-pay

## 2011-11-06 ENCOUNTER — Emergency Department (HOSPITAL_COMMUNITY)
Admission: EM | Admit: 2011-11-06 | Discharge: 2011-11-06 | Disposition: A | Discharge status: Home / Self Care | Payer: PRIVATE HEALTH INSURANCE | Attending: Emergency Medicine | Admitting: Emergency Medicine

## 2011-11-06 ENCOUNTER — Encounter (HOSPITAL_COMMUNITY): Payer: Self-pay

## 2011-11-06 ENCOUNTER — Emergency Department (HOSPITAL_COMMUNITY): Payer: PRIVATE HEALTH INSURANCE

## 2011-11-06 DIAGNOSIS — Z79899 Other long term (current) drug therapy: Secondary | ICD-10-CM | POA: Insufficient documentation

## 2011-11-06 DIAGNOSIS — R079 Chest pain, unspecified: Secondary | ICD-10-CM | POA: Insufficient documentation

## 2011-11-06 DIAGNOSIS — L0390 Cellulitis, unspecified: Secondary | ICD-10-CM

## 2011-11-06 DIAGNOSIS — F17200 Nicotine dependence, unspecified, uncomplicated: Secondary | ICD-10-CM | POA: Insufficient documentation

## 2011-11-06 DIAGNOSIS — M7989 Other specified soft tissue disorders: Secondary | ICD-10-CM

## 2011-11-06 DIAGNOSIS — Z794 Long term (current) use of insulin: Secondary | ICD-10-CM | POA: Insufficient documentation

## 2011-11-06 DIAGNOSIS — R209 Unspecified disturbances of skin sensation: Secondary | ICD-10-CM | POA: Insufficient documentation

## 2011-11-06 DIAGNOSIS — I10 Essential (primary) hypertension: Secondary | ICD-10-CM | POA: Insufficient documentation

## 2011-11-06 DIAGNOSIS — E119 Type 2 diabetes mellitus without complications: Secondary | ICD-10-CM | POA: Insufficient documentation

## 2011-11-06 DIAGNOSIS — L03119 Cellulitis of unspecified part of limb: Secondary | ICD-10-CM | POA: Insufficient documentation

## 2011-11-06 DIAGNOSIS — R0602 Shortness of breath: Secondary | ICD-10-CM | POA: Insufficient documentation

## 2011-11-06 DIAGNOSIS — L02419 Cutaneous abscess of limb, unspecified: Secondary | ICD-10-CM | POA: Insufficient documentation

## 2011-11-06 LAB — COMPREHENSIVE METABOLIC PANEL
ALT: 17 U/L (ref 0–35)
AST: 36 U/L (ref 0–37)
Albumin: 3.1 g/dL — ABNORMAL LOW (ref 3.5–5.2)
Alkaline Phosphatase: 60 U/L (ref 39–117)
BUN: 14 mg/dL (ref 6–23)
CO2: 28 mEq/L (ref 19–32)
Calcium: 9 mg/dL (ref 8.4–10.5)
Chloride: 101 mEq/L (ref 96–112)
Creatinine, Ser: 0.91 mg/dL (ref 0.50–1.10)
GFR calc Af Amer: 81 mL/min — ABNORMAL LOW (ref >90)
GFR calc non Af Amer: 70 mL/min — ABNORMAL LOW (ref >90)
Glucose, Bld: 267 mg/dL — ABNORMAL HIGH (ref 70–99)
Potassium: 3.6 mEq/L (ref 3.5–5.1)
Sodium: 137 mEq/L (ref 135–145)
Total Bilirubin: 0.5 mg/dL (ref 0.3–1.2)
Total Protein: 6.9 g/dL (ref 6.0–8.3)

## 2011-11-06 LAB — CBC WITH DIFFERENTIAL/PLATELET
Basophils Absolute: 0 10*3/uL (ref 0.0–0.1)
Basophils Relative: 0 % (ref 0–1)
Eosinophils Absolute: 0.2 10*3/uL (ref 0.0–0.7)
Eosinophils Relative: 2 % (ref 0–5)
HCT: 37.5 % (ref 36.0–46.0)
Hemoglobin: 12.3 g/dL (ref 12.0–15.0)
Lymphocytes Relative: 42 % (ref 12–46)
Lymphs Abs: 3.7 10*3/uL (ref 0.7–4.0)
MCH: 27.8 pg (ref 26.0–34.0)
MCHC: 32.8 g/dL (ref 30.0–36.0)
MCV: 84.7 fL (ref 78.0–100.0)
Monocytes Absolute: 0.5 10*3/uL (ref 0.1–1.0)
Monocytes Relative: 6 % (ref 3–12)
Neutro Abs: 4.4 10*3/uL (ref 1.7–7.7)
Neutrophils Relative %: 50 % (ref 43–77)
Platelets: 157 10*3/uL (ref 150–400)
RBC: 4.43 MIL/uL (ref 3.87–5.11)
RDW: 12.5 % (ref 11.5–15.5)
WBC: 8.7 10*3/uL (ref 4.0–10.5)

## 2011-11-06 LAB — PRO B NATRIURETIC PEPTIDE: Pro B Natriuretic peptide (BNP): 23 pg/mL (ref 0–125)

## 2011-11-06 MED ORDER — OXYCODONE-ACETAMINOPHEN 5-325 MG PO TABS: 1.0000 | ORAL_TABLET | ORAL | Status: DC | PRN

## 2011-11-06 MED ORDER — METFORMIN HCL 500 MG PO TABS: 1000.0000 mg | ORAL_TABLET | Freq: Two times a day (BID) | ORAL | Status: DC

## 2011-11-06 MED ORDER — HYDROCHLOROTHIAZIDE 25 MG PO TABS: 25.0000 mg | ORAL_TABLET | Freq: Every morning | ORAL | Status: DC

## 2011-11-06 MED ORDER — BUPROPION HCL ER (XL) 150 MG PO TB24
150.0000 mg | ORAL_TABLET | Freq: Every day | ORAL | Status: DC
  Filled 2011-11-06: qty 1

## 2011-11-06 MED ORDER — CEPHALEXIN 500 MG PO CAPS: 500.0000 mg | ORAL_CAPSULE | Freq: Four times a day (QID) | ORAL | Status: AC

## 2011-11-06 MED ORDER — VANCOMYCIN HCL IN DEXTROSE 1-5 GM/200ML-% IV SOLN
1000.0000 mg | Freq: Once | INTRAVENOUS | Status: AC
  Administered 2011-11-06: 1000 mg via INTRAVENOUS
  Filled 2011-11-06: qty 200

## 2011-11-06 MED ORDER — OXYCODONE HCL 5 MG PO TABS
5.0000 mg | ORAL_TABLET | ORAL | Status: DC | PRN
  Administered 2011-11-06: 5 mg via ORAL
  Filled 2011-11-06: qty 1

## 2011-11-06 MED ORDER — OXYCODONE-ACETAMINOPHEN 10-325 MG PO TABS: 1.0000 | ORAL_TABLET | ORAL | Status: DC | PRN

## 2011-11-06 MED ORDER — DULOXETINE HCL 60 MG PO CPEP
60.0000 mg | ORAL_CAPSULE | Freq: Every day | ORAL | Status: DC
  Filled 2011-11-06: qty 1

## 2011-11-06 MED ORDER — MORPHINE SULFATE 4 MG/ML IJ SOLN: 4.0000 mg | Freq: Once | INTRAMUSCULAR | Status: DC

## 2011-11-06 MED ORDER — CITALOPRAM HYDROBROMIDE 40 MG PO TABS
40.0000 mg | ORAL_TABLET | Freq: Every day | ORAL | Status: DC
  Filled 2011-11-06: qty 1

## 2011-11-06 MED ORDER — ONDANSETRON HCL 4 MG/2ML IJ SOLN: 4.0000 mg | INTRAMUSCULAR | Status: DC

## 2011-11-06 MED ORDER — INSULIN GLARGINE 100 UNIT/ML ~~LOC~~ SOLN
20.0000 [IU] | Freq: Two times a day (BID) | SUBCUTANEOUS | Status: DC
  Administered 2011-11-06: 20 [IU] via SUBCUTANEOUS
  Filled 2011-11-06: qty 1

## 2011-11-06 MED ORDER — SULFAMETHOXAZOLE-TRIMETHOPRIM 800-160 MG PO TABS: 1.0000 | ORAL_TABLET | Freq: Two times a day (BID) | ORAL | Status: AC

## 2011-11-06 MED ORDER — INSULIN ASPART 100 UNIT/ML ~~LOC~~ SOLN: 0.0000 [IU] | Freq: Three times a day (TID) | SUBCUTANEOUS | Status: DC

## 2011-11-06 MED ORDER — DEXTROSE 5 % IV SOLN
1.0000 g | Freq: Once | INTRAVENOUS | Status: AC
  Administered 2011-11-06: 1 g via INTRAVENOUS
  Filled 2011-11-06: qty 10

## 2011-11-06 NOTE — ED Notes (Signed)
Rx given x2 Pt ambulating independently w/ steady gait on d/c in no acute distress, A&Ox4. D/c instructions reviewed w/ pt - pt denies any further questions or concerns at present.   

## 2011-11-06 NOTE — ED Provider Notes (Signed)
History     CSN: [id number]  Arrival date & time 11/06/11  1307   First MD Initiated Contact with Patient 11/06/11 1751      Chief Complaint  Patient presents with  . Cellulitis    (Consider location/radiation/quality/duration/timing/severity/associated sxs/prior treatment) HPI Comments: Patient reports swelling and redness to bilateral lower extremity for the past 2 weeks, left greater than right. History of cellulitis as well as neuropathy. She denies any fever, chest pain, shortness of breath, nausea or vomiting. She denies any bleeding or drainage from her legs. His history diabetes  The history is provided by the patient.    Past Medical History  Diagnosis Date  . Diabetes mellitus   . Hypertension   . Arthritis   . History of drug abuse states quit herion in 2006  . Chronic back pain   . Frequency of urination   . Urge urinary incontinence   . Nocturia   . Edema of lower extremity history cellulitis jan 2013  . Methadone maintenance therapy patient   . Anxiety   . Depression   . History of chronic bronchitis   . Asthma   . Tendonitis     Past Surgical History  Procedure Date  . Appendectomy 1972  . Interstim implant placement 10/23/2011    Procedure: INTERSTIM IMPLANT FIRST STAGE;  Surgeon: Martina Sinner, MD;  Location: [facility];  Service: Urology;  Laterality: N/A;  . Interstim implant placement 10/23/2011    Procedure: INTERSTIM IMPLANT SECOND STAGE;  Surgeon: Martina Sinner, MD;  Location: [facility];  Service: Urology;  Laterality: N/A;    History reviewed. No pertinent family history.  History  Substance Use Topics  . Smoking status: Current Everyday Smoker -- 1.0 packs/day for 20 years    Types: Cigarettes  . Smokeless tobacco: Never Used  . Alcohol Use: No    OB History    Grav Para Term Preterm Abortions TAB SAB Ect Mult Living                  Review of Systems  Constitutional: Negative for fever,  activity change and appetite change.  HENT: Negative for congestion and rhinorrhea.   Respiratory: Negative for cough, chest tightness and shortness of breath.   Cardiovascular: Negative for chest pain.  Gastrointestinal: Negative for nausea, vomiting and abdominal pain.  Genitourinary: Negative for dysuria, vaginal bleeding and vaginal discharge.  Musculoskeletal: Positive for myalgias and arthralgias. Negative for back pain.  Skin: Negative for rash.  Neurological: Negative for dizziness, weakness and headaches.    Allergies  Gabapentin; Lyrica; and Vicodin  Home Medications   Current Outpatient Rx  Name Route Sig Dispense Refill  . ALBUTEROL SULFATE HFA 108 (90 BASE) MCG/ACT IN AERS Inhalation Inhale 2 puffs into the lungs every 6 (six) hours as needed. For wheezing    . BUPROPION HCL ER (XL) 150 MG PO TB24 Oral Take 150 mg by mouth daily.     [name] CITALOPRAM HYDROBROMIDE 40 MG PO TABS Oral Take 40 mg by mouth daily.     . DULOXETINE HCL 60 MG PO CPEP Oral Take 60 mg by mouth daily.    [name] HYDROCHLOROTHIAZIDE 25 MG PO TABS Oral Take 25 mg by mouth every morning.     . INSULIN GLARGINE 100 UNIT/ML [place] SOLN Subcutaneous Inject 20 Units into the skin 2 (two) times daily.     [name] METFORMIN HCL 1000 MG PO TABS Oral Take 1,000 mg by mouth 2 (  two) times daily with a meal.     . METHADONE HCL PO Oral Take 100 mg by mouth every morning.    . ADULT MULTIVITAMIN W/MINERALS CH Oral Take 1 tablet by mouth daily.     . OXYCODONE-ACETAMINOPHEN 10-325 MG PO TABS Oral Take 1 tablet by mouth every 4 (four) hours as needed. For pain      BP 121/76  Pulse 75  Temp 97.8 F (36.6 C) (Oral)  Resp 20  SpO2 100%  Physical Exam  Constitutional: She is oriented to person, place, and time. She appears well-developed and well-nourished. No distress.  HENT:  Head: Normocephalic and atraumatic.  Mouth/Throat: No oropharyngeal exudate.  Eyes: Conjunctivae are normal. Pupils are equal, round, and reactive to  light.  Neck: Normal range of motion. Neck supple.  Cardiovascular: Normal rate, regular rhythm and normal heart sounds.   Pulmonary/Chest: Effort normal and breath sounds normal. No respiratory distress.  Abdominal: Soft. There is no tenderness. There is no rebound and no guarding.  Musculoskeletal: Normal range of motion. She exhibits edema. She exhibits no tenderness.       Mild pitting edema to mid tibia. Slight erythema to mid tibia as well left worse than right. +2 DP and PT pulses. No palpable cords or calf tenderness  Neurological: She is oriented to person, place, and time. No cranial nerve deficit.  Skin: Skin is warm.    ED Course  Procedures (including critical care time)  Labs Reviewed  COMPREHENSIVE METABOLIC PANEL - Abnormal; Notable for the following:    Glucose, Bld 267 (*)     Albumin 3.1 (*)     GFR calc non Af Amer 70 (*)     GFR calc Af Amer 81 (*)     All other components within normal limits  CBC WITH DIFFERENTIAL  PRO B NATRIURETIC PEPTIDE   Dg Chest 2 View  11/06/2011  *RADIOLOGY REPORT*  Clinical Data: Chest pain and shortness of breath.  Bilateral hand numbness.  Diabetes and hypertension.  CHEST - 2 VIEW  Comparison: 03/24/2011.  Findings: Normal sized heart.  Clear lungs with normal vascularity. Unremarkable bones.  IMPRESSION: No acute abnormality.  Original Report Authenticated By: Darrol Angel, M.D.     No diagnosis found.    MDM  Diabetic patient with questionable early cellulitis. Vital stable, no distress, afebrile.  Neurovascular intact distally with +2 pulses. Questionable early cellulitis, will start antibiotics and placed in CDU for observation. Dopplers will be obtained to rule out DVTs.       Glynn Octave, MD 11/06/11 [phone]

## 2011-11-06 NOTE — ED Provider Notes (Signed)
56 year old diabetic female transferred from pod A cellulitis protocol for was considered to be bilateral lower extremity cellulitis.  Bilateral lower extremity pitting edema with warmth and tenderness.Dopplers are pending.  Preliminary interpretation a vascular Doppler study shows no DVT or SVT. Patient is actively receiving IV vancomycin and Rocephin  IV antibiotics are completed. I will discharge the patient on a combination of Keflex and Bactrim. Area of redness with a marked so patient can easily track spread of erythema. Patient will be given strict return precautions for any spread of redness or fever. I will ask the patient to return in 48 hours for a recheck if redness and swelling or not improving.  Wynetta Emery, PA-C 11/06/11 [phone]

## 2011-11-06 NOTE — Progress Notes (Signed)
VASCULAR LAB PRELIMINARY  PRELIMINARY  PRELIMINARY  PRELIMINARY  Bilateral lower extremity venous duplex  completed.    Preliminary report:  Bilateral:  No evidence of DVT, superficial thrombosis, or Baker's Cyst.   Sarye Kath, RVT 11/06/2011, 7:43 PM

## 2011-11-06 NOTE — ED Notes (Signed)
Pt called for room placement no answer x2.

## 2011-11-06 NOTE — ED Notes (Signed)
Pt called x2 for hourly rounding & reassessment.

## 2011-11-06 NOTE — ED Notes (Signed)
Pt reports BLE redness and swelling x1-2 weeks ago. Pt reports she was dx w/cellulitis in January 2013. Pt has redness and swelling to BLE

## 2011-11-06 NOTE — ED Notes (Signed)
Pt did not answer when called

## 2011-11-06 NOTE — ED Provider Notes (Signed)
Medical screening examination/treatment/procedure(s) were conducted as a shared visit with non-physician practitioner(s) and myself.  I personally evaluated the patient during the encounter   Rose Octave, MD 11/06/11 2334

## 2012-01-02 ENCOUNTER — Encounter (HOSPITAL_COMMUNITY): Payer: Self-pay | Admitting: Emergency Medicine

## 2012-01-02 ENCOUNTER — Emergency Department (HOSPITAL_COMMUNITY)
Admission: EM | Admit: 2012-01-02 | Discharge: 2012-01-02 | Disposition: A | Discharge status: Home / Self Care | Payer: PRIVATE HEALTH INSURANCE | Attending: Emergency Medicine | Admitting: Emergency Medicine

## 2012-01-02 DIAGNOSIS — G8929 Other chronic pain: Secondary | ICD-10-CM | POA: Insufficient documentation

## 2012-01-02 DIAGNOSIS — F17200 Nicotine dependence, unspecified, uncomplicated: Secondary | ICD-10-CM | POA: Insufficient documentation

## 2012-01-02 DIAGNOSIS — E119 Type 2 diabetes mellitus without complications: Secondary | ICD-10-CM | POA: Insufficient documentation

## 2012-01-02 DIAGNOSIS — M129 Arthropathy, unspecified: Secondary | ICD-10-CM | POA: Insufficient documentation

## 2012-01-02 DIAGNOSIS — L0390 Cellulitis, unspecified: Secondary | ICD-10-CM

## 2012-01-02 DIAGNOSIS — J45909 Unspecified asthma, uncomplicated: Secondary | ICD-10-CM | POA: Insufficient documentation

## 2012-01-02 DIAGNOSIS — L02419 Cutaneous abscess of limb, unspecified: Secondary | ICD-10-CM | POA: Insufficient documentation

## 2012-01-02 DIAGNOSIS — I10 Essential (primary) hypertension: Secondary | ICD-10-CM | POA: Insufficient documentation

## 2012-01-02 DIAGNOSIS — Z794 Long term (current) use of insulin: Secondary | ICD-10-CM | POA: Insufficient documentation

## 2012-01-02 DIAGNOSIS — Z79899 Other long term (current) drug therapy: Secondary | ICD-10-CM | POA: Insufficient documentation

## 2012-01-02 LAB — GLUCOSE, CAPILLARY: Glucose-Capillary: 249 mg/dL — ABNORMAL HIGH (ref 70–99)

## 2012-01-02 MED ORDER — SULFAMETHOXAZOLE-TMP DS 800-160 MG PO TABS
1.0000 | ORAL_TABLET | Freq: Once | ORAL | Status: AC
  Administered 2012-01-02: 1 via ORAL
  Filled 2012-01-02: qty 1

## 2012-01-02 MED ORDER — SODIUM CHLORIDE 0.9 % IV BOLUS (SEPSIS)
1000.0000 mL | Freq: Once | INTRAVENOUS | Status: AC
  Administered 2012-01-02: 1000 mL via INTRAVENOUS

## 2012-01-02 MED ORDER — SULFAMETHOXAZOLE-TMP DS 800-160 MG PO TABS: 1.0000 | ORAL_TABLET | Freq: Two times a day (BID) | ORAL | Status: DC

## 2012-01-02 NOTE — ED Provider Notes (Signed)
History     CSN: [id number]  Arrival date & time 01/02/12  1136   First MD Initiated Contact with Patient 01/02/12 1156      Chief Complaint  Patient presents with  . Leg Pain    (Consider location/radiation/quality/duration/timing/severity/associated sxs/prior treatment) HPI Cc: Leg pain  Leg pain: Swelling started 3-5 days ago. Redness and pain of L leg started last night. Taking all Rx as prescribed. Celullitis chronic condition for pt and most recetnly treated in August. Denies any calf pain or claudication. Nothing improves pain. Decreased po for past day w/ some dizziness w/ ambulation. Denies, cp, syncope, rash,   Past Medical History  Diagnosis Date  . Diabetes mellitus   . Hypertension   . Arthritis   . History of drug abuse states quit herion in 2006  . Chronic back pain   . Frequency of urination   . Urge urinary incontinence   . Nocturia   . Edema of lower extremity history cellulitis jan 2013  . Methadone maintenance therapy patient   . Anxiety   . Depression   . History of chronic bronchitis   . Asthma   . Tendonitis     Past Surgical History  Procedure Date  . Appendectomy 1972  . Interstim implant placement 10/23/2011    Procedure: INTERSTIM IMPLANT FIRST STAGE;  Surgeon: Martina Sinner, MD;  Location: [facility];  Service: Urology;  Laterality: N/A;  . Interstim implant placement 10/23/2011    Procedure: INTERSTIM IMPLANT SECOND STAGE;  Surgeon: Martina Sinner, MD;  Location: [facility];  Service: Urology;  Laterality: N/A;    No family history on file.  History  Substance Use Topics  . Smoking status: Current Every Day Smoker -- 1.0 packs/day for 20 years    Types: Cigarettes  . Smokeless tobacco: Never Used  . Alcohol Use: No    OB History    Grav Para Term Preterm Abortions TAB SAB Ect Mult Living                  Review of Systems  Constitutional: Positive for activity change, appetite change  and fatigue. Negative for fever and chills.  Cardiovascular: Negative for chest pain.  Gastrointestinal: Negative for nausea.  Skin: Positive for color change. Negative for rash.  Neurological: Positive for dizziness. Negative for syncope.  All other systems reviewed and are negative.    Allergies  Gabapentin; Lyrica; and Vicodin  Home Medications   Current Outpatient Rx  Name Route Sig Dispense Refill  . DULOXETINE HCL 60 MG PO CPEP Oral Take 60 mg by mouth daily.    [name] HYDROCHLOROTHIAZIDE 25 MG PO TABS Oral Take 25 mg by mouth every morning.     . INSULIN GLARGINE 100 UNIT/ML [place] SOLN Subcutaneous Inject 20 Units into the skin 2 (two) times daily.     [name] METFORMIN HCL 1000 MG PO TABS Oral Take 1,000 mg by mouth 2 (two) times daily with a meal.     . METHADONE HCL PO Oral Take 100 mg by mouth every morning.    . ADULT MULTIVITAMIN W/MINERALS CH Oral Take 1 tablet by mouth daily.     . OXYCODONE-ACETAMINOPHEN 10-325 MG PO TABS Oral Take 1 tablet by mouth every 4 (four) hours as needed. For pain    . PREGABALIN 75 MG PO CAPS Oral Take 75 mg by mouth 2 (two) times daily.    . ALBUTEROL SULFATE HFA 108 (90 BASE) MCG/ACT IN AERS  Inhalation Inhale 2 puffs into the lungs every 6 (six) hours as needed. For wheezing    . SULFAMETHOXAZOLE-TMP DS 800-160 MG PO TABS Oral Take 1 tablet by mouth 2 (two) times daily. 19 tablet 0    BP 137/66  Pulse 76  Temp 97.8 F (36.6 C)  Resp 18  SpO2 99%  Physical Exam  Constitutional: She appears well-developed and well-nourished.  HENT:  Head: Normocephalic.  Eyes: Conjunctivae normal and EOM are normal.  Neck: Normal range of motion.  Cardiovascular: Normal rate.   Pulmonary/Chest: Effort normal.  Abdominal: Soft.  Musculoskeletal: Normal range of motion. She exhibits edema.  Neurological: She is alert.  Skin: Skin is warm. She is not diaphoretic.       tight and shiny w/ redness of LLE. LLE warm to palpation  Psychiatric: She has a normal mood  and affect. Her behavior is normal.   ED Course  Procedures (including critical care time)  Labs Reviewed  GLUCOSE, CAPILLARY - Abnormal; Notable for the following:    Glucose-Capillary 249 (*)     All other components within normal limits   No results found.   1. Cellulitis       MDM  56yo AAF w/ likely cellulitis. 400cc NS given in ED w/ improvement of VS. - stable at time of DC - outpt Bactrim - Pt to follow up with PCP.          Ozella Rocks, MD 01/02/12 1459  Ozella Rocks, MD 01/16/12 [phone]

## 2012-01-02 NOTE — ED Notes (Signed)
States she did not have $ to get insulin till yesterday so she had not any for 3 days before that

## 2012-01-02 NOTE — ED Notes (Signed)
Left leg pain states that she wears to tight footies and that might be why her foot is green has hx of cellulitis

## 2012-01-17 NOTE — ED Provider Notes (Signed)
I have personally seen and examined the patient.  I have discussed the plan of care with the resident.  I have reviewed the documentation on PMH/FH/Soc. History.  I have reviewed the documentation of the resident and agree.  Pt well appearing, stable in ED, neurovascularly intact, stable for outpatient management  Joya Gaskins, MD 01/17/12 [phone]

## 2012-02-27 ENCOUNTER — Encounter (HOSPITAL_COMMUNITY): Payer: Self-pay | Admitting: *Deleted

## 2012-02-27 ENCOUNTER — Inpatient Hospital Stay (HOSPITAL_COMMUNITY)
Admission: EM | Admit: 2012-02-27 | Discharge: 2012-02-29 | DRG: 638 | Disposition: A | Discharge status: Home / Self Care | Payer: PRIVATE HEALTH INSURANCE | Attending: Internal Medicine | Admitting: Internal Medicine

## 2012-02-27 DIAGNOSIS — R7309 Other abnormal glucose: Secondary | ICD-10-CM

## 2012-02-27 DIAGNOSIS — E118 Type 2 diabetes mellitus with unspecified complications: Secondary | ICD-10-CM | POA: Diagnosis present

## 2012-02-27 DIAGNOSIS — I10 Essential (primary) hypertension: Secondary | ICD-10-CM | POA: Diagnosis present

## 2012-02-27 DIAGNOSIS — E1100 Type 2 diabetes mellitus with hyperosmolarity without nonketotic hyperglycemic-hyperosmolar coma (NKHHC): Principal | ICD-10-CM | POA: Diagnosis present

## 2012-02-27 DIAGNOSIS — E1165 Type 2 diabetes mellitus with hyperglycemia: Secondary | ICD-10-CM

## 2012-02-27 DIAGNOSIS — E878 Other disorders of electrolyte and fluid balance, not elsewhere classified: Secondary | ICD-10-CM

## 2012-02-27 DIAGNOSIS — R739 Hyperglycemia, unspecified: Secondary | ICD-10-CM

## 2012-02-27 DIAGNOSIS — E871 Hypo-osmolality and hyponatremia: Secondary | ICD-10-CM

## 2012-02-27 DIAGNOSIS — G629 Polyneuropathy, unspecified: Secondary | ICD-10-CM

## 2012-02-27 DIAGNOSIS — E119 Type 2 diabetes mellitus without complications: Secondary | ICD-10-CM | POA: Diagnosis present

## 2012-02-27 LAB — BASIC METABOLIC PANEL
BUN: 13 mg/dL (ref 6–23)
BUN: 14 mg/dL (ref 6–23)
BUN: 13 mg/dL (ref 6–23)
CO2: 32 mEq/L (ref 19–32)
CO2: 28 mEq/L (ref 19–32)
CO2: 29 mEq/L (ref 19–32)
Calcium: 9.4 mg/dL (ref 8.4–10.5)
Calcium: 9.3 mg/dL (ref 8.4–10.5)
Calcium: 8.8 mg/dL (ref 8.4–10.5)
Chloride: 87 mEq/L — ABNORMAL LOW (ref 96–112)
Chloride: 89 mEq/L — ABNORMAL LOW (ref 96–112)
Chloride: 95 mEq/L — ABNORMAL LOW (ref 96–112)
Creatinine, Ser: 1.08 mg/dL (ref 0.50–1.10)
Creatinine, Ser: 1.03 mg/dL (ref 0.50–1.10)
Creatinine, Ser: 1.17 mg/dL — ABNORMAL HIGH (ref 0.50–1.10)
GFR calc Af Amer: 66 mL/min — ABNORMAL LOW (ref >90)
GFR calc Af Amer: 70 mL/min — ABNORMAL LOW (ref >90)
GFR calc Af Amer: 60 mL/min — ABNORMAL LOW (ref >90)
GFR calc non Af Amer: 57 mL/min — ABNORMAL LOW (ref >90)
GFR calc non Af Amer: 60 mL/min — ABNORMAL LOW (ref >90)
GFR calc non Af Amer: 52 mL/min — ABNORMAL LOW (ref >90)
Glucose, Bld: 699 mg/dL (ref 70–99)
Glucose, Bld: 602 mg/dL (ref 70–99)
Glucose, Bld: 351 mg/dL — ABNORMAL HIGH (ref 70–99)
Potassium: 4 mEq/L (ref 3.5–5.1)
Potassium: 3.6 mEq/L (ref 3.5–5.1)
Potassium: 3.1 mEq/L — ABNORMAL LOW (ref 3.5–5.1)
Sodium: 127 mEq/L — ABNORMAL LOW (ref 135–145)
Sodium: 130 mEq/L — ABNORMAL LOW (ref 135–145)
Sodium: 136 mEq/L (ref 135–145)

## 2012-02-27 LAB — URINALYSIS, ROUTINE W REFLEX MICROSCOPIC
Bilirubin Urine: NEGATIVE
Glucose, UA: >1000 mg/dL — AB
Hgb urine dipstick: NEGATIVE
Ketones, ur: NEGATIVE mg/dL
Nitrite: NEGATIVE
Protein, ur: NEGATIVE mg/dL
Specific Gravity, Urine: 1.031 — ABNORMAL HIGH (ref 1.005–1.030)
Urobilinogen, UA: 1 mg/dL (ref 0.0–1.0)
pH: 6 (ref 5.0–8.0)

## 2012-02-27 LAB — RAPID URINE DRUG SCREEN, HOSP PERFORMED
Amphetamines: NOT DETECTED
Barbiturates: NOT DETECTED
Benzodiazepines: NOT DETECTED
Cocaine: NOT DETECTED
Opiates: NOT DETECTED
Tetrahydrocannabinol: NOT DETECTED

## 2012-02-27 LAB — CBC WITH DIFFERENTIAL/PLATELET
Basophils Absolute: 0 10*3/uL (ref 0.0–0.1)
Basophils Relative: 0 % (ref 0–1)
Eosinophils Absolute: 0.1 10*3/uL (ref 0.0–0.7)
Eosinophils Relative: 1 % (ref 0–5)
HCT: 39.8 % (ref 36.0–46.0)
Hemoglobin: 13.2 g/dL (ref 12.0–15.0)
Lymphocytes Relative: 42 % (ref 12–46)
Lymphs Abs: 3.4 10*3/uL (ref 0.7–4.0)
MCH: 27.4 pg (ref 26.0–34.0)
MCHC: 33.2 g/dL (ref 30.0–36.0)
MCV: 82.7 fL (ref 78.0–100.0)
Monocytes Absolute: 0.4 10*3/uL (ref 0.1–1.0)
Monocytes Relative: 5 % (ref 3–12)
Neutro Abs: 4.2 10*3/uL (ref 1.7–7.7)
Neutrophils Relative %: 52 % (ref 43–77)
Platelets: 169 10*3/uL (ref 150–400)
RBC: 4.81 MIL/uL (ref 3.87–5.11)
RDW: 12.4 % (ref 11.5–15.5)
WBC: 8.1 10*3/uL (ref 4.0–10.5)

## 2012-02-27 LAB — URINE MICROSCOPIC-ADD ON

## 2012-02-27 LAB — PHOSPHORUS: Phosphorus: 3.8 mg/dL (ref 2.3–4.6)

## 2012-02-27 LAB — CBC
HCT: 39.9 % (ref 36.0–46.0)
Hemoglobin: 13.4 g/dL (ref 12.0–15.0)
MCH: 27.9 pg (ref 26.0–34.0)
MCHC: 33.6 g/dL (ref 30.0–36.0)
MCV: 83 fL (ref 78.0–100.0)
Platelets: 148 10*3/uL — ABNORMAL LOW (ref 150–400)
RBC: 4.81 MIL/uL (ref 3.87–5.11)
RDW: 12.5 % (ref 11.5–15.5)
WBC: 9.8 10*3/uL (ref 4.0–10.5)

## 2012-02-27 LAB — MAGNESIUM: Magnesium: 2.2 mg/dL (ref 1.5–2.5)

## 2012-02-27 MED ORDER — PNEUMOCOCCAL VAC POLYVALENT 25 MCG/0.5ML IJ INJ
0.5000 mL | INJECTION | INTRAMUSCULAR | Status: AC
  Administered 2012-02-28: 0.5 mL via INTRAMUSCULAR
  Filled 2012-02-27: qty 0.5

## 2012-02-27 MED ORDER — SODIUM CHLORIDE 0.9 % IV SOLN
INTRAVENOUS | Status: DC
  Administered 2012-02-27 – 2012-02-28 (×3): via INTRAVENOUS

## 2012-02-27 MED ORDER — INFLUENZA VIRUS VACC SPLIT PF IM SUSP
0.5000 mL | INTRAMUSCULAR | Status: AC
  Administered 2012-02-28: 0.5 mL via INTRAMUSCULAR
  Filled 2012-02-27: qty 0.5

## 2012-02-27 MED ORDER — SODIUM CHLORIDE 0.9 % IV BOLUS (SEPSIS): 1000.0000 mL | Freq: Once | INTRAVENOUS | Status: DC

## 2012-02-27 MED ORDER — ALBUTEROL SULFATE HFA 108 (90 BASE) MCG/ACT IN AERS: 2.0000 | INHALATION_SPRAY | Freq: Four times a day (QID) | RESPIRATORY_TRACT | Status: DC | PRN

## 2012-02-27 MED ORDER — ENOXAPARIN SODIUM 40 MG/0.4ML ~~LOC~~ SOLN
40.0000 mg | SUBCUTANEOUS | Status: DC
  Filled 2012-02-27: qty 0.4

## 2012-02-27 MED ORDER — POTASSIUM CHLORIDE 10 MEQ/100ML IV SOLN
10.0000 meq | INTRAVENOUS | Status: AC
  Administered 2012-02-27 (×2): 10 meq via INTRAVENOUS
  Filled 2012-02-27 (×2): qty 100

## 2012-02-27 MED ORDER — SODIUM CHLORIDE 0.9 % IV SOLN
INTRAVENOUS | Status: DC
  Filled 2012-02-27: qty 1

## 2012-02-27 MED ORDER — ONDANSETRON HCL 4 MG PO TABS: 4.0000 mg | ORAL_TABLET | Freq: Four times a day (QID) | ORAL | Status: DC | PRN

## 2012-02-27 MED ORDER — OXYCODONE-ACETAMINOPHEN 5-325 MG PO TABS
1.0000 | ORAL_TABLET | Freq: Once | ORAL | Status: AC
  Administered 2012-02-27: 1 via ORAL
  Filled 2012-02-27: qty 1

## 2012-02-27 MED ORDER — POTASSIUM CHLORIDE 10 MEQ/100ML IV SOLN
10.0000 meq | INTRAVENOUS | Status: AC
  Administered 2012-02-27 – 2012-02-28 (×4): 10 meq via INTRAVENOUS
  Filled 2012-02-27 (×6): qty 100

## 2012-02-27 MED ORDER — DEXTROSE 50 % IV SOLN: 25.0000 mL | INTRAVENOUS | Status: DC | PRN

## 2012-02-27 MED ORDER — OXYCODONE-ACETAMINOPHEN 10-325 MG PO TABS: 1.0000 | ORAL_TABLET | ORAL | Status: DC | PRN

## 2012-02-27 MED ORDER — OXYCODONE-ACETAMINOPHEN 5-325 MG PO TABS
1.0000 | ORAL_TABLET | ORAL | Status: DC | PRN
  Administered 2012-02-27 – 2012-02-29 (×6): 1 via ORAL
  Filled 2012-02-27 (×6): qty 1

## 2012-02-27 MED ORDER — HYDROCHLOROTHIAZIDE 25 MG PO TABS
25.0000 mg | ORAL_TABLET | Freq: Every morning | ORAL | Status: DC
  Administered 2012-02-28: 25 mg via ORAL
  Filled 2012-02-27 (×2): qty 1

## 2012-02-27 MED ORDER — ENOXAPARIN SODIUM 40 MG/0.4ML ~~LOC~~ SOLN
40.0000 mg | SUBCUTANEOUS | Status: DC
  Administered 2012-02-27 – 2012-02-28 (×2): 40 mg via SUBCUTANEOUS
  Filled 2012-02-27 (×3): qty 0.4

## 2012-02-27 MED ORDER — OXYCODONE HCL 5 MG PO TABS
5.0000 mg | ORAL_TABLET | ORAL | Status: DC | PRN
  Administered 2012-02-27 – 2012-02-29 (×6): 5 mg via ORAL
  Filled 2012-02-27 (×6): qty 1

## 2012-02-27 MED ORDER — SODIUM CHLORIDE 0.9 % IV BOLUS (SEPSIS)
1000.0000 mL | Freq: Once | INTRAVENOUS | Status: AC
  Administered 2012-02-27: 1000 mL via INTRAVENOUS

## 2012-02-27 MED ORDER — INSULIN ASPART 100 UNIT/ML ~~LOC~~ SOLN
0.0000 [IU] | Freq: Three times a day (TID) | SUBCUTANEOUS | Status: DC
  Administered 2012-02-28 – 2012-02-29 (×3): 3 [IU] via SUBCUTANEOUS

## 2012-02-27 MED ORDER — SODIUM CHLORIDE 0.9 % IV BOLUS (SEPSIS): 1000.0000 mL | Freq: Two times a day (BID) | INTRAVENOUS | Status: DC

## 2012-02-27 MED ORDER — INSULIN GLARGINE 100 UNIT/ML ~~LOC~~ SOLN
10.0000 [IU] | Freq: Two times a day (BID) | SUBCUTANEOUS | Status: DC
  Administered 2012-02-27: 10 [IU] via SUBCUTANEOUS
  Filled 2012-02-27: qty 1

## 2012-02-27 MED ORDER — ONDANSETRON HCL 4 MG/2ML IJ SOLN: 4.0000 mg | Freq: Four times a day (QID) | INTRAMUSCULAR | Status: DC | PRN

## 2012-02-27 MED ORDER — SODIUM CHLORIDE 0.9 % IV SOLN
INTRAVENOUS | Status: DC
  Administered 2012-02-27: 18:00:00 via INTRAVENOUS
  Filled 2012-02-27: qty 1

## 2012-02-27 MED ORDER — POTASSIUM CHLORIDE CRYS ER 20 MEQ PO TBCR: 40.0000 meq | EXTENDED_RELEASE_TABLET | Freq: Once | ORAL | Status: DC

## 2012-02-27 NOTE — ED Notes (Signed)
Called lab to draw labs upstairs

## 2012-02-27 NOTE — ED Notes (Signed)
Hailey, RN verified Lantus dose

## 2012-02-27 NOTE — ED Provider Notes (Signed)
History    CSN: [id number] Arrival date & time 02/27/12  1229 First MD Initiated Contact with Patient 02/27/12 1255     Chief Complaint  Patient presents with  . Multiple complaints     HPI Pt has complaints of pain in her arms, legs, hands and feet for the lost week.  She feels a burning and a sensation that a glove is over her hand.   The pain in her feet cause a burning pain as well.  Pt has history of fibromyalgia as well but she is not sure that it is related.  She also has noticed that if her she touches the carpeting in her house it will cause problems as well. Past Medical History  Diagnosis Date  . Diabetes mellitus   . Hypertension   . Arthritis   . History of drug abuse states quit herion in 2006  . Chronic back pain   . Frequency of urination   . Urge urinary incontinence   . Nocturia   . Edema of lower extremity history cellulitis jan 2013  . Methadone maintenance therapy patient   . Anxiety   . Depression   . History of chronic bronchitis   . Asthma   . Tendonitis     Past Surgical History  Procedure Date  . Appendectomy 1972  . Interstim implant placement 10/23/2011    Procedure: INTERSTIM IMPLANT FIRST STAGE;  Surgeon: Martina Sinner, MD;  Location: [facility];  Service: Urology;  Laterality: N/A;  . Interstim implant placement 10/23/2011    Procedure: INTERSTIM IMPLANT SECOND STAGE;  Surgeon: Martina Sinner, MD;  Location: [facility];  Service: Urology;  Laterality: N/A;    No family history on file.  History  Substance Use Topics  . Smoking status: Current Every Day Smoker -- 1.0 packs/day for 20 years    Types: Cigarettes  . Smokeless tobacco: Never Used  . Alcohol Use: No    OB History    Grav Para Term Preterm Abortions TAB SAB Ect Mult Living                  Review of Systems  Constitutional: Negative for fever.  Respiratory: Negative for cough.   Gastrointestinal: Negative for abdominal pain.    Musculoskeletal: Positive for myalgias.  Skin: Negative for rash.  All other systems reviewed and are negative.    Allergies  Gabapentin; Lyrica; and Vicodin  Home Medications   Current Outpatient Rx  Name  Route  Sig  Dispense  Refill  . ALBUTEROL SULFATE HFA 108 (90 BASE) MCG/ACT IN AERS   Inhalation   Inhale 2 puffs into the lungs every 6 (six) hours as needed. For wheezing         . HYDROCHLOROTHIAZIDE 25 MG PO TABS   Oral   Take 25 mg by mouth every morning.          . INSULIN GLARGINE 100 UNIT/ML [place] SOLN   Subcutaneous   Inject 10 Units into the skin 2 (two) times daily.          [name] METFORMIN HCL 1000 MG PO TABS   Oral   Take 1,000 mg by mouth daily.          [name] METHADONE HCL PO   Oral   Take 100 mg by mouth every morning.         . ADULT MULTIVITAMIN W/MINERALS CH   Oral   Take 1 tablet by mouth daily.          [name]  OXYCODONE-ACETAMINOPHEN 10-325 MG PO TABS   Oral   Take 1 tablet by mouth every 4 (four) hours as needed. For pain           BP 107/74  Pulse 68  Temp 98.2 F (36.8 C) (Oral)  Resp 18  SpO2 100%  Physical Exam  Nursing note and vitals reviewed. Constitutional: She appears well-developed and well-nourished. No distress.  HENT:  Head: Normocephalic and atraumatic.  Right Ear: External ear normal.  Left Ear: External ear normal.  Eyes: Conjunctivae normal are normal. Right eye exhibits no discharge. Left eye exhibits no discharge. No scleral icterus.  Neck: Neck supple. No tracheal deviation present.  Cardiovascular: Normal rate, regular rhythm and intact distal pulses.   Pulmonary/Chest: Effort normal and breath sounds normal. No stridor. No respiratory distress. She has no wheezes. She has no rales.  Abdominal: Soft. Bowel sounds are normal. She exhibits no distension. There is no tenderness. There is no rebound and no guarding.  Musculoskeletal: She exhibits tenderness. She exhibits no edema.       Mild diffusely in hands  and feet  Neurological: She is alert. She has normal strength. No sensory deficit. Cranial nerve deficit:  no gross defecits noted. She exhibits normal muscle tone. She displays no seizure activity. Coordination normal.  Skin: Skin is warm and dry. No rash noted. No pallor.       Thickened skin, mild erythema greater in the left foot than right,  Psychiatric: She has a normal mood and affect.       Tearful at times    ED Course  Procedures (including critical care time) 2:08 PM Notified of elevated glucose.  Pt states she has taken her medications.  Will start IV fluids and insulin infusion.  CRITICAL CARE Performed by: Celene Kras Total critical care time: 30 Critical care time was exclusive of separately billable procedures and treating other patients. Critical care was necessary to treat or prevent imminent or life-threatening deterioration. Critical care was time spent personally by me on the following activities: development of treatment plan with patient and/or surrogate as well as nursing, discussions with consultants, evaluation of patient's response to treatment, examination of patient, obtaining history from patient or surrogate, ordering and performing treatments and interventions, ordering and review of laboratory studies, ordering and review of radiographic studies, pulse oximetry and re-evaluation of patient's condition.  Labs Reviewed  BASIC METABOLIC PANEL - Abnormal; Notable for the following:    Sodium 127 (*)     Chloride 87 (*)     Glucose, Bld 699 (*)     GFR calc non Af Amer 57 (*)     GFR calc Af Amer 66 (*)     All other components within normal limits  CBC WITH DIFFERENTIAL   No results found.   1. Hyperglycemia   2. Neuropathy   3. Hyponatremia   4. Hypochloremia       MDM  The patient has hyperglycemia without evidence of ketosis. Patient be admitted to the hospital for IV hydration and IV insulin therapy.  I suspect her complaints of general  malaise and neuropathy is exacerbated by this extreme hyperglycemia.  No acute sign of infection on exam at this time.        Celene Kras, MD 02/27/12 [phone]

## 2012-02-27 NOTE — ED Notes (Signed)
IV team at bedside 

## 2012-02-27 NOTE — ED Notes (Signed)
IV team paged and responded.

## 2012-02-27 NOTE — ED Notes (Signed)
?   Rt leg pain, history of cellullitis, bil hand pain, pt has fibromyalgia, c/o body soreness

## 2012-02-27 NOTE — ED Notes (Signed)
Pt getting IV start. Will try for urine sample later.

## 2012-02-27 NOTE — H&P (Signed)
[facility] History and Physical  Rose [name] AOZ:[id number] DOB: Jan 14, 1956 DOA: 02/27/2012  Referring physician: ED physician PCP: Quitman Livings, MD   Chief Complaint: hyperglycemia  HPI:  Pt is 56 yo female who presents to [facility] ED with main concern of persistently elevated sugar levels at home not very responsive to medical regimen at home with Metformin and inulin. She initially noted this one week prior to admission and has noted associate generalized weakness, poor oral intake.She denies any specific abdominal or urinary concerns, no fevers, no chills, no other systemic symptoms, no chest pain or shortness of breath. Pt also reports chronic lower extremity numbness and tingling.   Assessment and Plan: Principal Problem:  *Diabetes mellitus type 2 with complications, uncontrolled - pt is likely in hyperosmolar state and very dehydrated - will admit to medical floor for further evaluation and will continue to monitor vitals per floor protocol - order bolus NS and continue infusion of NS - continue Insulin drip for now and monitor BMP - supplement potassium per protocol Active Problems:  HTN (hypertension) - monitor vitals per floor protocol Hyponatremia - secondary to pre renal etiology in the setting of dehydration - IVF as noted above - BMP now and in AM  Code Status: Full Family Communication: Pt at bedside Disposition Plan: PT evaluation    Review of Systems:  Constitutional: Negative for fever, chills, positive for malaise/fatigue. Negative for diaphoresis.  HENT: Negative for hearing loss, ear pain, nosebleeds, congestion, sore throat, neck pain, tinnitus and ear discharge.   Eyes: Negative for blurred vision, double vision, photophobia, pain, discharge and redness.  Respiratory: Negative for cough, hemoptysis, sputum production, shortness of breath, wheezing and stridor.   Cardiovascular: Negative for chest pain, palpitations, orthopnea, claudication and leg  swelling.  Gastrointestinal: Negative for nausea, vomiting and abdominal pain. Negative for heartburn, constipation, blood in stool and melena.  Genitourinary: Negative for dysuria, urgency, frequency, hematuria and flank pain.  Musculoskeletal: Negative for myalgias, back pain, joint pain and falls.  Skin: Negative for itching and rash.  Neurological: Negative for dizziness and weakness. Positive for tingling, numbness, negative for speech change, focal weakness, loss of consciousness and headaches.  Endo/Heme/Allergies: Negative for environmental allergies and polydipsia. Does not bruise/bleed easily.  Psychiatric/Behavioral: Negative for suicidal ideas. The patient is not nervous/anxious.      Past Medical History  Diagnosis Date  . Diabetes mellitus   . Hypertension   . Arthritis   . History of drug abuse states quit herion in 2006  . Chronic back pain   . Frequency of urination   . Urge urinary incontinence   . Nocturia   . Edema of lower extremity history cellulitis jan 2013  . Methadone maintenance therapy patient   . Anxiety   . Depression   . History of chronic bronchitis   . Asthma   . Tendonitis     Past Surgical History  Procedure Date  . Appendectomy 1972  . Interstim implant placement 10/23/2011    Procedure: INTERSTIM IMPLANT FIRST STAGE;  Surgeon: Martina Sinner, MD;  Location: [facility];  Service: Urology;  Laterality: N/A;  . Interstim implant placement 10/23/2011    Procedure: INTERSTIM IMPLANT SECOND STAGE;  Surgeon: Martina Sinner, MD;  Location: [facility];  Service: Urology;  Laterality: N/A;    Social History:  reports that she has been smoking Cigarettes.  She has a 20 pack-year smoking history. She has never used smokeless tobacco. She reports that she does not  drink alcohol or use illicit drugs.  Allergies  Allergen Reactions  . Gabapentin Other (See Comments)    Only when takes over 300 mg dose. "gives me  the shakes"  . Lyrica (Pregabalin) Other (See Comments)    "gives me the shakes/jerks"  . Vicodin (Hydrocodone-Acetaminophen) Diarrhea and Other (See Comments)    Stomach  cramps    No history of cancers or heart disease  Prior to Admission medications   Medication Sig Start Date End Date Taking? Authorizing Provider  albuterol (PROVENTIL HFA;VENTOLIN HFA) 108 (90 BASE) MCG/ACT inhaler Inhale 2 puffs into the lungs every 6 (six) hours as needed. For wheezing   Yes Historical Provider, MD  hydrochlorothiazide (HYDRODIURIL) 25 MG tablet Take 25 mg by mouth every morning.    Yes Historical Provider, MD  insulin glargine (LANTUS) 100 UNIT/ML injection Inject 10 Units into the skin 2 (two) times daily.    Yes Historical Provider, MD  metFORMIN (GLUCOPHAGE) 1000 MG tablet Take 1,000 mg by mouth daily.    Yes Historical Provider, MD  METHADONE HCL PO Take 100 mg by mouth every morning.   Yes Historical Provider, MD  Multiple Vitamin (MULITIVITAMIN WITH MINERALS) TABS Take 1 tablet by mouth daily.    Yes Historical Provider, MD  oxyCODONE-acetaminophen (PERCOCET) 10-325 MG per tablet Take 1 tablet by mouth every 4 (four) hours as needed. For pain   Yes Historical Provider, MD    Physical Exam: Filed Vitals:   02/27/12 1239  BP: 107/74  Pulse: 68  Temp: 98.2 F (36.8 C)  TempSrc: Oral  Resp: 18  SpO2: 100%    Physical Exam  Constitutional: Appears well-developed and well-nourished. No distress.  HENT: Normocephalic. External right and left ear normal. Oropharynx is clear and moist.  Eyes: Conjunctivae and EOM are normal. PERRLA, no scleral icterus.  Neck: Normal ROM. Neck supple. No JVD. No tracheal deviation. No thyromegaly.  CVS: RRR, S1/S2 +, no murmurs, no gallops, no carotid bruit.  Pulmonary: Effort and breath sounds normal, no stridor, rhonchi, wheezes, rales.  Abdominal: Soft. BS +,  no distension, tenderness, rebound or guarding.  Musculoskeletal: Normal range of motion. No  edema and no tenderness.  Lymphadenopathy: No lymphadenopathy noted, cervical, inguinal. Neuro: Alert. Normal reflexes, muscle tone coordination. No cranial nerve deficit. Skin: Skin is warm and dry. No rash noted. Not diaphoretic. No erythema. No pallor.  Psychiatric: Normal mood and affect. Behavior, judgment, thought content normal.   Labs on Admission:  Basic Metabolic Panel:  Lab 02/27/12 1610  NA 127*  K 4.0  CL 87*  CO2 32  GLUCOSE 699*  BUN 13  CREATININE 1.08  CALCIUM 9.4  MG --  PHOS --   CBC:  Lab 02/27/12 1321  WBC 8.1  NEUTROABS 4.2  HGB 13.2  HCT 39.8  MCV 82.7  PLT 169   Radiological Exams on Admission: No results found.  EKG: Normal sinus rhythm, no ST/T wave changes  Debbora Presto, MD  [facility] Pager [phone]  If 7PM-7AM, please contact night-coverage [email or web address] Password [facility] 02/27/2012, 2:23 PM

## 2012-02-28 DIAGNOSIS — E878 Other disorders of electrolyte and fluid balance, not elsewhere classified: Secondary | ICD-10-CM

## 2012-02-28 LAB — GLUCOSE, CAPILLARY
Glucose-Capillary: 137 mg/dL — ABNORMAL HIGH (ref 70–99)
Glucose-Capillary: 144 mg/dL — ABNORMAL HIGH (ref 70–99)
Glucose-Capillary: 149 mg/dL — ABNORMAL HIGH (ref 70–99)
Glucose-Capillary: 164 mg/dL — ABNORMAL HIGH (ref 70–99)
Glucose-Capillary: 174 mg/dL — ABNORMAL HIGH (ref 70–99)
Glucose-Capillary: 193 mg/dL — ABNORMAL HIGH (ref 70–99)
Glucose-Capillary: 431 mg/dL — ABNORMAL HIGH (ref 70–99)
Glucose-Capillary: 593 mg/dL (ref 70–99)
Glucose-Capillary: 124 mg/dL — ABNORMAL HIGH (ref 70–99)
Glucose-Capillary: 109 mg/dL — ABNORMAL HIGH (ref 70–99)
Glucose-Capillary: 113 mg/dL — ABNORMAL HIGH (ref 70–99)
Glucose-Capillary: 220 mg/dL — ABNORMAL HIGH (ref 70–99)
Glucose-Capillary: 207 mg/dL — ABNORMAL HIGH (ref 70–99)

## 2012-02-28 LAB — CBC
HCT: 38 % (ref 36.0–46.0)
Hemoglobin: 12.9 g/dL (ref 12.0–15.0)
MCH: 28.2 pg (ref 26.0–34.0)
MCHC: 33.9 g/dL (ref 30.0–36.0)
MCV: 83 fL (ref 78.0–100.0)
Platelets: 153 10*3/uL (ref 150–400)
RBC: 4.58 MIL/uL (ref 3.87–5.11)
RDW: 12.6 % (ref 11.5–15.5)
WBC: 9.5 10*3/uL (ref 4.0–10.5)

## 2012-02-28 LAB — BASIC METABOLIC PANEL
BUN: 13 mg/dL (ref 6–23)
CO2: 26 mEq/L (ref 19–32)
Calcium: 8.5 mg/dL (ref 8.4–10.5)
Chloride: 103 mEq/L (ref 96–112)
Creatinine, Ser: 0.89 mg/dL (ref 0.50–1.10)
GFR calc Af Amer: 84 mL/min — ABNORMAL LOW (ref >90)
GFR calc non Af Amer: 72 mL/min — ABNORMAL LOW (ref >90)
Glucose, Bld: 120 mg/dL — ABNORMAL HIGH (ref 70–99)
Potassium: 4.3 mEq/L (ref 3.5–5.1)
Sodium: 138 mEq/L (ref 135–145)

## 2012-02-28 LAB — HEMOGLOBIN A1C
Hgb A1c MFr Bld: 13 % — ABNORMAL HIGH (ref <5.7)
Mean Plasma Glucose: 326 mg/dL — ABNORMAL HIGH (ref <117)

## 2012-02-28 MED ORDER — PREGABALIN 50 MG PO CAPS
50.0000 mg | ORAL_CAPSULE | Freq: Every day | ORAL | Status: DC
  Administered 2012-02-28: 50 mg via ORAL
  Filled 2012-02-28: qty 1

## 2012-02-28 MED ORDER — METFORMIN HCL 500 MG PO TABS
1000.0000 mg | ORAL_TABLET | Freq: Every day | ORAL | Status: DC
  Administered 2012-02-28: 1000 mg via ORAL
  Filled 2012-02-28 (×2): qty 2

## 2012-02-28 MED ORDER — INSULIN GLARGINE 100 UNIT/ML ~~LOC~~ SOLN
10.0000 [IU] | Freq: Two times a day (BID) | SUBCUTANEOUS | Status: DC
  Administered 2012-02-28 (×2): 10 [IU] via SUBCUTANEOUS

## 2012-02-28 NOTE — Progress Notes (Signed)
Called [facility], orders were to discontinue insulin drip and dka management protocol. No latus was ordered. Will continue AC/HS blood sugar checks and sliding scale orders. Patient stable, will continue to monitor.  MCCLAIN, Shantaya Bluestone L 02/28/2012 2:21 AM

## 2012-02-28 NOTE — Progress Notes (Signed)
Patient ID: Rose [name], female   DOB: June 28, 1955, 56 y.o.   MRN: [id number]  [facility] PROGRESS NOTE  Tiann Saha WUJ:[id number] DOB: February 15, 1956 DOA: 02/27/2012 PCP: Quitman Livings, MD  Brief narrative: HPI:  Pt is 56 yo female who presents to [facility] ED with main concern of persistently elevated sugar levels at home not very responsive to medical regimen at home with Metformin and inulin. She initially noted this one week prior to admission and has noted associate generalized weakness, poor oral intake.She denies any specific abdominal or urinary concerns, no fevers, no chills, no other systemic symptoms, no chest pain or shortness of breath. Pt also reports chronic lower extremity numbness and tingling.   Assessment and Plan:  Principal Problem:  *Diabetes mellitus type 2 with complications, uncontrolled  - hyperosmolar state and very dehydrated on admission - supportive care with IVF provided and pt was started on Insulin drip - CBG's have been stable over 10 hour period and will plan on transitioning to subcutaneous form of insulin, SSI and will restart Lantus - pt reports medical compliance but A1C is 13 - will see how well CBG's are on her home Insulin Lantus dose and will readjust as indicated - potassium is within normal limits this AM - consult for diabetic educator Active Problems:  HTN (hypertension)  - monitor vitals per floor protocol  Hyponatremia  - secondary to pre renal etiology in the setting of dehydration  - IVF as noted above were provided and pt has responded well - within normal limits this AM - BMP in AM   Consultants:  Diabetic coordinator  Procedures/Studies:  none  Antibiotics:  none  Code Status: Full Family Communication: Pt at bedside Disposition Plan: Home in AM  HPI/Subjective: No events overnight.   Objective: Filed Vitals:   02/27/12 1524 02/27/12 1653 02/27/12 2200 02/28/12 0600  BP: 114/79 128/83 128/77 137/72  Pulse: 84  85 76 83  Temp: 97.9 F (36.6 C) 98.2 F (36.8 C) 98.2 F (36.8 C) 98.1 F (36.7 C)  TempSrc:  Oral Oral   Resp:  18 18 18   SpO2: 100% 99% 99% 100%    Intake/Output Summary (Last 24 hours) at 02/28/12 1127 Last data filed at 02/27/12 1915  Gross per 24 hour  Intake      0 ml  Output   1050 ml  Net  -1050 ml    Exam:   General:  Pt is alert, follows commands appropriately, not in acute distress  Cardiovascular: Regular rate and rhythm, S1/S2, no murmurs, no rubs, no gallops  Respiratory: Clear to auscultation bilaterally, no wheezing, no crackles, no rhonchi  Abdomen: Soft, non tender, non distended, bowel sounds present, no guarding  Extremities: No edema, pulses DP and PT palpable bilaterally  Neuro: Grossly nonfocal  Data Reviewed: Basic Metabolic Panel:  Lab 02/28/12 9562 02/27/12 1848 02/27/12 1537 02/27/12 1321  NA 138 136 130* 127*  K 4.3 3.1* 3.6 4.0  CL 103 95* 89* 87*  CO2 26 29 28  32  GLUCOSE 120* 351* 602* 699*  BUN 13 13 14 13   CREATININE 0.89 1.17* 1.03 1.08  CALCIUM 8.5 8.8 9.3 9.4  MG -- -- 2.2 --  PHOS -- -- 3.8 --   CBC:  Lab 02/28/12 0609 02/27/12 1849 02/27/12 1321  WBC 9.5 9.8 8.1  NEUTROABS -- -- 4.2  HGB 12.9 13.4 13.2  HCT 38.0 39.9 39.8  MCV 83.0 83.0 82.7  PLT 153 148* 169   CBG:  Lab 02/28/12 1104  02/28/12 0738 02/28/12 0222 02/28/12 0211 02/28/12 0106  GLUCAP 220* 113* 109* 124* 137*    Scheduled Meds:   . enoxaparin  40 mg Subcutaneous Q24H  . hydrochlorothiazide  25 mg Oral q morning - 10a  . [COMPLETED] influenza  inactive virus vaccine  0.5 mL Intramuscular Tomorrow-1000  . insulin aspart  0-9 Units Subcutaneous TID WC  . insulin glargine  10 Units Subcutaneous BID  . metFORMIN  1,000 mg Oral Daily  . [COMPLETED] oxyCODONE-acetaminophen  1 tablet Oral Once  . [COMPLETED] pneumococcal 23 valent vaccine  0.5 mL Intramuscular Tomorrow-1000  . [COMPLETED] potassium chloride  10 mEq Intravenous Q1H  . [EXPIRED]  potassium chloride  10 mEq Intravenous Q1 Hr x 6  . [COMPLETED] sodium chloride  1,000 mL Intravenous Once  . [COMPLETED] sodium chloride  1,000 mL Intravenous Once  . sodium chloride  1,000 mL Intravenous Once  . [DISCONTINUED] enoxaparin (LOVENOX) injection  40 mg Subcutaneous Q24H  . [DISCONTINUED] insulin glargine  10 Units Subcutaneous BID  . [DISCONTINUED] potassium chloride  40 mEq Oral Once  . [DISCONTINUED] sodium chloride  1,000 mL Intravenous BID   Continuous Infusions:   . sodium chloride 125 mL/hr at 02/27/12 2217  . [DISCONTINUED] insulin (NOVOLIN-R) infusion    . [DISCONTINUED] insulin (NOVOLIN-R) infusion       Debbora Presto, MD  [facility] Pager [phone]  If 7PM-7AM, please contact night-coverage [email or web address] Password [email or web address] 02/28/2012, 11:27 AM   LOS: 1 day

## 2012-02-28 NOTE — Progress Notes (Signed)
IV insulin wasted in sink with Donnie Aho, RN.

## 2012-02-29 LAB — BASIC METABOLIC PANEL
BUN: 11 mg/dL (ref 6–23)
CO2: 23 mEq/L (ref 19–32)
Calcium: 8.5 mg/dL (ref 8.4–10.5)
Chloride: 103 mEq/L (ref 96–112)
Creatinine, Ser: 0.77 mg/dL (ref 0.50–1.10)
GFR calc Af Amer: >90 mL/min (ref >90)
GFR calc non Af Amer: >90 mL/min (ref >90)
Glucose, Bld: 234 mg/dL — ABNORMAL HIGH (ref 70–99)
Potassium: 4 mEq/L (ref 3.5–5.1)
Sodium: 135 mEq/L (ref 135–145)

## 2012-02-29 LAB — URINE CULTURE: Colony Count: 40000

## 2012-02-29 LAB — CBC
HCT: 38.9 % (ref 36.0–46.0)
Hemoglobin: 13.1 g/dL (ref 12.0–15.0)
MCH: 28.1 pg (ref 26.0–34.0)
MCHC: 33.7 g/dL (ref 30.0–36.0)
MCV: 83.5 fL (ref 78.0–100.0)
Platelets: 138 10*3/uL — ABNORMAL LOW (ref 150–400)
RBC: 4.66 MIL/uL (ref 3.87–5.11)
RDW: 12.9 % (ref 11.5–15.5)
WBC: 5.8 10*3/uL (ref 4.0–10.5)

## 2012-02-29 LAB — GLUCOSE, CAPILLARY
Glucose-Capillary: 260 mg/dL — ABNORMAL HIGH (ref 70–99)
Glucose-Capillary: 234 mg/dL — ABNORMAL HIGH (ref 70–99)

## 2012-02-29 MED ORDER — OXYCODONE-ACETAMINOPHEN 10-325 MG PO TABS: 1.0000 | ORAL_TABLET | ORAL | Status: DC | PRN

## 2012-02-29 MED ORDER — PREGABALIN 50 MG PO CAPS: 50.0000 mg | ORAL_CAPSULE | Freq: Every day | ORAL | Status: DC

## 2012-02-29 NOTE — Plan of Care (Signed)
Problem: Discharge Progression Outcomes Goal: Obtain signed CBG meter Rx form Outcome: Not Applicable Date Met:  02/29/12 Pt not new diabetic

## 2012-02-29 NOTE — Discharge Summary (Signed)
Physician Discharge Summary  Rose [name] AVW:[id number] DOB: [birth year] DOA: 02/27/2012  PCP: Rose Livings, MD  Admit date: 02/27/2012 Discharge date: 02/29/2012  Recommendations for Outpatient Follow-up:  1. Pt will need to follow up with PCP in 2-3 weeks post discharge 2. Please obtain BMP to evaluate electrolytes and kidney function 3. Please also check CBC to evaluate Hg and Hct levels 4. Pt will need to have A1C checked in 3 months and discuss compliance if needed  Discharge Diagnoses: Diabetes mellitus with complications HONK Principal Problem:  *Diabetes mellitus type 2 with complications, uncontrolled Active Problems:  HTN (hypertension)  Discharge Condition: Stable  Diet recommendation: Heart healthy diet discussed in details   HPI:  Pt is 56 yo female who presents to [facility] ED with main concern of persistently elevated sugar levels at home not very responsive to medical regimen at home with Metformin and inulin. She initially noted this one week prior to admission and has noted associate generalized weakness, poor oral intake.She denies any specific abdominal or urinary concerns, no fevers, no chills, no other systemic symptoms, no chest pain or shortness of breath. Pt also reports chronic lower extremity numbness and tingling.   Assessment and Plan:  Principal Problem:  *Diabetes mellitus type 2 with complications, uncontrolled  - hyperosmolar state and very dehydrated on admission  - supportive care with IVF provided and pt was started on Insulin drip, has responded well  - CBG's have been stable over 10 hour period - A1C is 13  - potassium is within normal limits this AM  - consult for diabetic educator obtained Active Problems:  HTN (hypertension)  - monitored vitals per floor protocol  Hyponatremia  - secondary to pre renal etiology in the setting of dehydration  - IVF as noted above were provided and pt has responded well  - within normal limits this AM    Consultants:  Diabetic coordinator Procedures/Studies:  none Antibiotics:  None  Code Status: Full  Family Communication: Pt at bedside  Disposition Plan: Home today  Discharge Exam: Filed Vitals:   02/29/12 0600  BP: 143/77  Pulse: 80  Temp: 98.4 F (36.9 C)  Resp: 18   Filed Vitals:   02/28/12 2200 02/28/12 2300 02/29/12 0200 02/29/12 0600  BP: 184/100 170/90 132/82 143/77  Pulse: 85  75 80  Temp: 97.7 F (36.5 C)  97.8 F (36.6 C) 98.4 F (36.9 C)  TempSrc: Oral  Oral Oral  Resp: 20  20 18   SpO2: 99%  98% 98%    General: Pt is alert, follows commands appropriately, not in acute distress Cardiovascular: Regular rate and rhythm, S1/S2 +, no murmurs, no rubs, no gallops Respiratory: Clear to auscultation bilaterally, no wheezing, no crackles, no rhonchi Abdominal: Soft, non tender, non distended, bowel sounds +, no guarding Extremities: no edema, no cyanosis, pulses palpable bilaterally DP and PT Neuro: Grossly nonfocal  Discharge Instructions  Discharge Orders    Future Orders Please Complete By Expires   Diet - low sodium heart healthy      Increase activity slowly          Medication List     As of 02/29/2012  8:50 AM    TAKE these medications         albuterol 108 (90 BASE) MCG/ACT inhaler   Commonly known as: PROVENTIL HFA;VENTOLIN HFA   Inhale 2 puffs into the lungs every 6 (six) hours as needed. For wheezing      hydrochlorothiazide 25 MG tablet   Commonly  known as: HYDRODIURIL   Take 25 mg by mouth every morning.      insulin glargine 100 UNIT/ML injection   Commonly known as: LANTUS   Inject 10 Units into the skin 2 (two) times daily.      metFORMIN 1000 MG tablet   Commonly known as: GLUCOPHAGE   Take 1,000 mg by mouth daily.      METHADONE HCL PO   Take 100 mg by mouth every morning.      multivitamin with minerals Tabs   Take 1 tablet by mouth daily.      oxyCODONE-acetaminophen 10-325 MG per tablet   Commonly known as:  PERCOCET   Take 1 tablet by mouth every 4 (four) hours as needed. For pain      pregabalin 50 MG capsule   Commonly known as: LYRICA   Take 1 capsule (50 mg total) by mouth at bedtime.           Follow-up Information    Follow up with [facility], [place].   Contact information:   [address] [place] [address] [phone]           The results of significant diagnostics from this hospitalization (including imaging, microbiology, ancillary and laboratory) are listed below for reference.     Microbiology: Recent Results (from the past 240 hour(s))  URINE CULTURE     Status: Normal   Collection Time   02/27/12  2:56 PM      Component Value Range Status Comment   Specimen Description URINE, CLEAN CATCH   Final    Special Requests NONE   Final    Culture  Setup Time 02/28/2012 01:31   Final    Colony Count 40,000 COLONIES/ML   Final    Culture     Final    Value: Multiple bacterial morphotypes present, none predominant. Suggest appropriate recollection if clinically indicated.   Report Status 02/29/2012 FINAL   Final      Labs: Basic Metabolic Panel:  Lab 02/29/12 9147 02/28/12 0609 02/27/12 1848 02/27/12 1537 02/27/12 1321  NA 135 138 136 130* 127*  K 4.0 4.3 3.1* 3.6 4.0  CL 103 103 95* 89* 87*  CO2 23 26 29 28  32  GLUCOSE 234* 120* 351* 602* 699*  BUN 11 13 13 14 13   CREATININE 0.77 0.89 1.17* 1.03 1.08  CALCIUM 8.5 8.5 8.8 9.3 9.4  MG -- -- -- 2.2 --  PHOS -- -- -- 3.8 --   CBC:  Lab 02/29/12 0559 02/28/12 0609 02/27/12 1849 02/27/12 1321  WBC 5.8 9.5 9.8 8.1  NEUTROABS -- -- -- 4.2  HGB 13.1 12.9 13.4 13.2  HCT 38.9 38.0 39.9 39.8  MCV 83.5 83.0 83.0 82.7  PLT 138* 153 148* 169   BNP: BNP (last 3 results)  Basename 11/06/11 1342  PROBNP 23.0   CBG:  Lab 02/29/12 0712 02/28/12 2137 02/28/12 1700 02/28/12 1104 02/28/12 0738  GLUCAP 234* 260* 207* 220* 113*     SIGNED: Time coordinating discharge: Over 30 minutes  Rose Presto,  MD  [facility] 02/29/2012, 8:50 AM Pager [phone]  If 7PM-7AM, please contact night-coverage [email or web address] Password [email or web address]

## 2012-02-29 NOTE — Progress Notes (Signed)
Pt discharged to home. DC instructions given using teachback.  No concerns voiced. Prescriptions x 2 given. Left unit in wheelchair pushed by nurse tech. Left in good condition. Pt excited about going  Home.

## 2012-06-13 ENCOUNTER — Emergency Department (HOSPITAL_COMMUNITY)
Admission: EM | Admit: 2012-06-13 | Discharge: 2012-06-13 | Disposition: A | Discharge status: Home / Self Care | Payer: PRIVATE HEALTH INSURANCE | Attending: Emergency Medicine | Admitting: Emergency Medicine

## 2012-06-13 DIAGNOSIS — G8929 Other chronic pain: Secondary | ICD-10-CM | POA: Insufficient documentation

## 2012-06-13 DIAGNOSIS — J45909 Unspecified asthma, uncomplicated: Secondary | ICD-10-CM | POA: Insufficient documentation

## 2012-06-13 DIAGNOSIS — Z8659 Personal history of other mental and behavioral disorders: Secondary | ICD-10-CM | POA: Insufficient documentation

## 2012-06-13 DIAGNOSIS — M549 Dorsalgia, unspecified: Secondary | ICD-10-CM | POA: Insufficient documentation

## 2012-06-13 DIAGNOSIS — F17200 Nicotine dependence, unspecified, uncomplicated: Secondary | ICD-10-CM | POA: Insufficient documentation

## 2012-06-13 DIAGNOSIS — Z79899 Other long term (current) drug therapy: Secondary | ICD-10-CM | POA: Insufficient documentation

## 2012-06-13 DIAGNOSIS — T50905A Adverse effect of unspecified drugs, medicaments and biological substances, initial encounter: Secondary | ICD-10-CM

## 2012-06-13 DIAGNOSIS — T426X1A Poisoning by other antiepileptic and sedative-hypnotic drugs, accidental (unintentional), initial encounter: Secondary | ICD-10-CM | POA: Insufficient documentation

## 2012-06-13 DIAGNOSIS — Z794 Long term (current) use of insulin: Secondary | ICD-10-CM | POA: Insufficient documentation

## 2012-06-13 DIAGNOSIS — I10 Essential (primary) hypertension: Secondary | ICD-10-CM | POA: Insufficient documentation

## 2012-06-13 DIAGNOSIS — E119 Type 2 diabetes mellitus without complications: Secondary | ICD-10-CM | POA: Insufficient documentation

## 2012-06-13 DIAGNOSIS — T4275XA Adverse effect of unspecified antiepileptic and sedative-hypnotic drugs, initial encounter: Secondary | ICD-10-CM | POA: Insufficient documentation

## 2012-06-13 DIAGNOSIS — Z8739 Personal history of other diseases of the musculoskeletal system and connective tissue: Secondary | ICD-10-CM | POA: Insufficient documentation

## 2012-06-13 DIAGNOSIS — Z8709 Personal history of other diseases of the respiratory system: Secondary | ICD-10-CM | POA: Insufficient documentation

## 2012-06-13 DIAGNOSIS — Z87448 Personal history of other diseases of urinary system: Secondary | ICD-10-CM | POA: Insufficient documentation

## 2012-06-13 LAB — CBC WITH DIFFERENTIAL/PLATELET
Basophils Absolute: 0 10*3/uL (ref 0.0–0.1)
Basophils Relative: 0 % (ref 0–1)
Eosinophils Absolute: 0.1 10*3/uL (ref 0.0–0.7)
Eosinophils Relative: 1 % (ref 0–5)
HCT: 42.6 % (ref 36.0–46.0)
Hemoglobin: 13.9 g/dL (ref 12.0–15.0)
Lymphocytes Relative: 39 % (ref 12–46)
Lymphs Abs: 4.3 10*3/uL — ABNORMAL HIGH (ref 0.7–4.0)
MCH: 26.8 pg (ref 26.0–34.0)
MCHC: 32.6 g/dL (ref 30.0–36.0)
MCV: 82.1 fL (ref 78.0–100.0)
Monocytes Absolute: 0.9 10*3/uL (ref 0.1–1.0)
Monocytes Relative: 8 % (ref 3–12)
Neutro Abs: 5.7 10*3/uL (ref 1.7–7.7)
Neutrophils Relative %: 52 % (ref 43–77)
Platelets: 136 10*3/uL — ABNORMAL LOW (ref 150–400)
RBC: 5.19 MIL/uL — ABNORMAL HIGH (ref 3.87–5.11)
RDW: 12.9 % (ref 11.5–15.5)
WBC: 10.9 10*3/uL — ABNORMAL HIGH (ref 4.0–10.5)

## 2012-06-13 LAB — URINALYSIS, ROUTINE W REFLEX MICROSCOPIC
Bilirubin Urine: NEGATIVE
Glucose, UA: >1000 mg/dL — AB
Hgb urine dipstick: NEGATIVE
Ketones, ur: NEGATIVE mg/dL
Leukocytes, UA: NEGATIVE
Nitrite: NEGATIVE
Protein, ur: NEGATIVE mg/dL
Specific Gravity, Urine: 1.021 (ref 1.005–1.030)
Urobilinogen, UA: 1 mg/dL (ref 0.0–1.0)
pH: 6.5 (ref 5.0–8.0)

## 2012-06-13 LAB — COMPREHENSIVE METABOLIC PANEL
ALT: 18 U/L (ref 0–35)
AST: 31 U/L (ref 0–37)
Albumin: 3.4 g/dL — ABNORMAL LOW (ref 3.5–5.2)
Alkaline Phosphatase: 108 U/L (ref 39–117)
BUN: 8 mg/dL (ref 6–23)
CO2: 28 mEq/L (ref 19–32)
Calcium: 8.7 mg/dL (ref 8.4–10.5)
Chloride: 101 mEq/L (ref 96–112)
Creatinine, Ser: 0.81 mg/dL (ref 0.50–1.10)
GFR calc Af Amer: >90 mL/min (ref >90)
GFR calc non Af Amer: 80 mL/min — ABNORMAL LOW (ref >90)
Glucose, Bld: 238 mg/dL — ABNORMAL HIGH (ref 70–99)
Potassium: 4.1 mEq/L (ref 3.5–5.1)
Sodium: 137 mEq/L (ref 135–145)
Total Bilirubin: 0.6 mg/dL (ref 0.3–1.2)
Total Protein: 7.8 g/dL (ref 6.0–8.3)

## 2012-06-13 LAB — RAPID URINE DRUG SCREEN, HOSP PERFORMED
Amphetamines: NOT DETECTED
Barbiturates: NOT DETECTED
Benzodiazepines: NOT DETECTED
Cocaine: NOT DETECTED
Opiates: NOT DETECTED
Tetrahydrocannabinol: NOT DETECTED

## 2012-06-13 LAB — URINE MICROSCOPIC-ADD ON

## 2012-06-13 LAB — ETHANOL: Alcohol, Ethyl (B): <11 mg/dL (ref 0–11)

## 2012-06-13 LAB — ACETAMINOPHEN LEVEL: Acetaminophen (Tylenol), Serum: <15 ug/mL (ref 10–30)

## 2012-06-13 LAB — SALICYLATE LEVEL: Salicylate Lvl: <2 mg/dL — ABNORMAL LOW (ref 2.8–20.0)

## 2012-06-13 NOTE — ED Notes (Signed)
Checked on pt during hourly rounds, pt asleep, would not respond to her name. Pt awake after sternal rub. Pt's purse was open on bed with her, noted that pt's rx in purse. Asked pt if she took anything from bag, which she denied. Pt fell back asleep. Consulting civil engineer notified.

## 2012-06-13 NOTE — ED Notes (Signed)
Bedside report received from previous RN 

## 2012-06-13 NOTE — ED Notes (Signed)
Pt fell asleep during the middle of the assessment.

## 2012-06-13 NOTE — ED Notes (Signed)
Pt admits to taking too much of her medication. States she was not trying to hurt self, denies SI/HI. Is a+o at this time

## 2012-06-13 NOTE — ED Provider Notes (Signed)
History     CSN: [id number]  Arrival date & time 06/13/12  1619   First MD Initiated Contact with Patient 06/13/12 1651      Chief Complaint  Patient presents with  . Altered Mental Status    (Consider location/radiation/quality/duration/timing/severity/associated sxs/prior treatment) HPI Pt tokk a dose of tegretol last night and the took her normal dose of methadone this morning. Pt was found asleep in car and EMS was called. Pt is now alert and states she thinks the drugs interacted. Denies other co ingestants. Denies suicide attempt. Pt now c/o of having a dry mouth and needing some water.  Past Medical History  Diagnosis Date  . Diabetes mellitus   . Hypertension   . Arthritis   . History of drug abuse states quit herion in 2006  . Chronic back pain   . Frequency of urination   . Urge urinary incontinence   . Nocturia   . Edema of lower extremity history cellulitis jan 2013  . Methadone maintenance therapy patient   . Anxiety   . Depression   . History of chronic bronchitis   . Asthma   . Tendonitis     Past Surgical History  Procedure Laterality Date  . Appendectomy  1972  . Interstim implant placement  10/23/2011    Procedure: INTERSTIM IMPLANT FIRST STAGE;  Surgeon: Martina Sinner, MD;  Location: [facility];  Service: Urology;  Laterality: N/A;  . Interstim implant placement  10/23/2011    Procedure: INTERSTIM IMPLANT SECOND STAGE;  Surgeon: Martina Sinner, MD;  Location: [facility];  Service: Urology;  Laterality: N/A;    No family history on file.  History  Substance Use Topics  . Smoking status: Current Every Day Smoker -- 1.00 packs/day for 20 years    Types: Cigarettes  . Smokeless tobacco: Never Used  . Alcohol Use: No    OB History   Grav Para Term Preterm Abortions TAB SAB Ect Mult Living                  Review of Systems  Constitutional: Negative for fever and chills.  HENT: Negative for neck pain.    Respiratory: Negative for shortness of breath.   Cardiovascular: Negative for chest pain.  Gastrointestinal: Negative for nausea, vomiting and abdominal pain.  Skin: Negative for rash and wound.  Neurological: Negative for dizziness, weakness, numbness and headaches.  Psychiatric/Behavioral: Negative for suicidal ideas and self-injury.  All other systems reviewed and are negative.    Allergies  Gabapentin and Vicodin  Home Medications   Current Outpatient Rx  Name  Route  Sig  Dispense  Refill  . albuterol (PROVENTIL HFA;VENTOLIN HFA) 108 (90 BASE) MCG/ACT inhaler   Inhalation   Inhale 2 puffs into the lungs every 6 (six) hours as needed. For wheezing         . carbamazepine (TEGRETOL) 200 MG tablet   Oral   Take 200 mg by mouth 2 (two) times daily.         . hydrochlorothiazide (HYDRODIURIL) 25 MG tablet   Oral   Take 25 mg by mouth every morning.          . insulin glargine (LANTUS) 100 UNIT/ML injection   Subcutaneous   Inject 10 Units into the skin 2 (two) times daily.          . metFORMIN (GLUCOPHAGE) 1000 MG tablet   Oral   Take 1,000 mg by mouth daily.          [name]  METHADONE HCL PO   Oral   Take 100 mg by mouth every morning.         . Multiple Vitamin (MULITIVITAMIN WITH MINERALS) TABS   Oral   Take 1 tablet by mouth daily.          . pregabalin (LYRICA) 200 MG capsule   Oral   Take 200 mg by mouth 2 (two) times daily.           BP 199/97  Pulse 81  Temp(Src) 98.4 F (36.9 C) (Oral)  Resp 11  SpO2 98%  Physical Exam  Nursing note and vitals reviewed. Constitutional: She is oriented to person, place, and time. She appears well-developed and well-nourished. No distress.  HENT:  Head: Normocephalic and atraumatic.  Dry tongue and lips  Eyes: EOM are normal. Pupils are equal, round, and reactive to light.  Neck: Normal range of motion. Neck supple.  No meningismus   Cardiovascular: Normal rate and regular rhythm.    Pulmonary/Chest: Effort normal and breath sounds normal. No respiratory distress. She has no wheezes. She has no rales.  Abdominal: Soft. Bowel sounds are normal. She exhibits no distension and no mass. There is no tenderness. There is no rebound and no guarding.  Musculoskeletal: Normal range of motion. She exhibits no edema and no tenderness.  Neurological: She is alert and oriented to person, place, and time.  5/5 motor in all ext, sensation intact. Pt speaking clearly.   Skin: Skin is warm and dry. No rash noted. No erythema.  Psychiatric: She has a normal mood and affect. Her behavior is normal.    ED Course  Procedures (including critical care time)  Labs Reviewed  CBC WITH DIFFERENTIAL - Abnormal; Notable for the following:    WBC 10.9 (*)    RBC 5.19 (*)    Platelets 136 (*)    Lymphs Abs 4.3 (*)    All other components within normal limits  COMPREHENSIVE METABOLIC PANEL - Abnormal; Notable for the following:    Glucose, Bld 238 (*)    Albumin 3.4 (*)    GFR calc non Af Amer 80 (*)    All other components within normal limits  URINALYSIS, ROUTINE W REFLEX MICROSCOPIC - Abnormal; Notable for the following:    APPearance CLOUDY (*)    Glucose, UA >1000 (*)    All other components within normal limits  SALICYLATE LEVEL - Abnormal; Notable for the following:    Salicylate Lvl <2.0 (*)    All other components within normal limits  ETHANOL  URINE RAPID DRUG SCREEN (HOSP PERFORMED)  ACETAMINOPHEN LEVEL  URINE MICROSCOPIC-ADD ON   No results found.   1. Medication side effect, initial encounter      Date: 06/13/2012  Rate: 75  Rhythm: normal sinus rhythm  QRS Axis: normal  Intervals: normal  ST/T Wave abnormalities: normal  Conduction Disutrbances:none  Narrative Interpretation:   Old EKG Reviewed: unchanged    MDM  Pt is now awake and alert asking to be d/c'd. Pt advised to f/u with PMD for medication adjustment.         Loren Racer, MD 06/13/12  [phone]

## 2012-06-13 NOTE — ED Notes (Signed)
**  PT'S PURSE LOCKED IN LOCKER #31

## 2012-06-13 NOTE — ED Notes (Addendum)
Pt takes methadone 100 mg every morning. Pt feels it has interacted with the medication she took last night called tegretol 200 mg. She said she took it but MD said she really shouldn't take it when she is on methadone but because she is in so much pain over right wrist tendonitis the MD said to go ahead and take it.  Pt also takes 200mg  of lyrica in morning and night. She feels like she just over did the medication.

## 2012-06-13 NOTE — ED Notes (Signed)
Pt was found slumped over the wheel of her steering wheel in a store parking lot. EMS was called . Pt was still found asleep when EMS arrived

## 2012-06-19 ENCOUNTER — Encounter (HOSPITAL_COMMUNITY): Payer: Self-pay | Admitting: *Deleted

## 2012-06-19 ENCOUNTER — Emergency Department (HOSPITAL_COMMUNITY)
Admission: EM | Admit: 2012-06-19 | Discharge: 2012-06-20 | Disposition: A | Discharge status: Home / Self Care | Payer: PRIVATE HEALTH INSURANCE | Attending: Emergency Medicine | Admitting: Emergency Medicine

## 2012-06-19 DIAGNOSIS — L089 Local infection of the skin and subcutaneous tissue, unspecified: Secondary | ICD-10-CM

## 2012-06-19 DIAGNOSIS — I10 Essential (primary) hypertension: Secondary | ICD-10-CM | POA: Insufficient documentation

## 2012-06-19 DIAGNOSIS — F1111 Opioid abuse, in remission: Secondary | ICD-10-CM | POA: Insufficient documentation

## 2012-06-19 DIAGNOSIS — L02419 Cutaneous abscess of limb, unspecified: Secondary | ICD-10-CM | POA: Insufficient documentation

## 2012-06-19 DIAGNOSIS — E1165 Type 2 diabetes mellitus with hyperglycemia: Secondary | ICD-10-CM

## 2012-06-19 DIAGNOSIS — Z79899 Other long term (current) drug therapy: Secondary | ICD-10-CM | POA: Insufficient documentation

## 2012-06-19 DIAGNOSIS — Z8709 Personal history of other diseases of the respiratory system: Secondary | ICD-10-CM | POA: Insufficient documentation

## 2012-06-19 DIAGNOSIS — Z8679 Personal history of other diseases of the circulatory system: Secondary | ICD-10-CM | POA: Insufficient documentation

## 2012-06-19 DIAGNOSIS — F17200 Nicotine dependence, unspecified, uncomplicated: Secondary | ICD-10-CM | POA: Insufficient documentation

## 2012-06-19 DIAGNOSIS — M25539 Pain in unspecified wrist: Secondary | ICD-10-CM | POA: Insufficient documentation

## 2012-06-19 DIAGNOSIS — R51 Headache: Secondary | ICD-10-CM | POA: Insufficient documentation

## 2012-06-19 DIAGNOSIS — Z794 Long term (current) use of insulin: Secondary | ICD-10-CM | POA: Insufficient documentation

## 2012-06-19 DIAGNOSIS — G8929 Other chronic pain: Secondary | ICD-10-CM | POA: Insufficient documentation

## 2012-06-19 DIAGNOSIS — Z8739 Personal history of other diseases of the musculoskeletal system and connective tissue: Secondary | ICD-10-CM | POA: Insufficient documentation

## 2012-06-19 DIAGNOSIS — M25532 Pain in left wrist: Secondary | ICD-10-CM

## 2012-06-19 DIAGNOSIS — Z8659 Personal history of other mental and behavioral disorders: Secondary | ICD-10-CM | POA: Insufficient documentation

## 2012-06-19 DIAGNOSIS — R509 Fever, unspecified: Secondary | ICD-10-CM | POA: Insufficient documentation

## 2012-06-19 DIAGNOSIS — IMO0001 Reserved for inherently not codable concepts without codable children: Secondary | ICD-10-CM | POA: Insufficient documentation

## 2012-06-19 DIAGNOSIS — Z87448 Personal history of other diseases of urinary system: Secondary | ICD-10-CM | POA: Insufficient documentation

## 2012-06-19 LAB — CBC WITH DIFFERENTIAL/PLATELET
Basophils Absolute: 0 10*3/uL (ref 0.0–0.1)
Basophils Relative: 0 % (ref 0–1)
Eosinophils Absolute: 0.2 10*3/uL (ref 0.0–0.7)
Eosinophils Relative: 1 % (ref 0–5)
HCT: 39.5 % (ref 36.0–46.0)
Hemoglobin: 12.9 g/dL (ref 12.0–15.0)
Lymphocytes Relative: 49 % — ABNORMAL HIGH (ref 12–46)
Lymphs Abs: 5.1 10*3/uL — ABNORMAL HIGH (ref 0.7–4.0)
MCH: 26.5 pg (ref 26.0–34.0)
MCHC: 32.7 g/dL (ref 30.0–36.0)
MCV: 81.3 fL (ref 78.0–100.0)
Monocytes Absolute: 0.8 10*3/uL (ref 0.1–1.0)
Monocytes Relative: 8 % (ref 3–12)
Neutro Abs: 4.5 10*3/uL (ref 1.7–7.7)
Neutrophils Relative %: 42 % — ABNORMAL LOW (ref 43–77)
Platelets: 200 10*3/uL (ref 150–400)
RBC: 4.86 MIL/uL (ref 3.87–5.11)
RDW: 13 % (ref 11.5–15.5)
WBC: 10.6 10*3/uL — ABNORMAL HIGH (ref 4.0–10.5)

## 2012-06-19 LAB — COMPREHENSIVE METABOLIC PANEL
ALT: 19 U/L (ref 0–35)
AST: 48 U/L — ABNORMAL HIGH (ref 0–37)
Albumin: 3.1 g/dL — ABNORMAL LOW (ref 3.5–5.2)
Alkaline Phosphatase: 94 U/L (ref 39–117)
BUN: 13 mg/dL (ref 6–23)
CO2: 27 mEq/L (ref 19–32)
Calcium: 8.5 mg/dL (ref 8.4–10.5)
Chloride: 96 mEq/L (ref 96–112)
Creatinine, Ser: 0.88 mg/dL (ref 0.50–1.10)
GFR calc Af Amer: 84 mL/min — ABNORMAL LOW (ref >90)
GFR calc non Af Amer: 73 mL/min — ABNORMAL LOW (ref >90)
Glucose, Bld: 292 mg/dL — ABNORMAL HIGH (ref 70–99)
Potassium: 5.8 mEq/L — ABNORMAL HIGH (ref 3.5–5.1)
Sodium: 132 mEq/L — ABNORMAL LOW (ref 135–145)
Total Bilirubin: 0.5 mg/dL (ref 0.3–1.2)
Total Protein: 7.7 g/dL (ref 6.0–8.3)

## 2012-06-19 MED ORDER — SODIUM CHLORIDE 0.9 % IV BOLUS (SEPSIS)
250.0000 mL | Freq: Once | INTRAVENOUS | Status: AC
  Administered 2012-06-19: 250 mL via INTRAVENOUS

## 2012-06-19 MED ORDER — IBUPROFEN 800 MG PO TABS
800.0000 mg | ORAL_TABLET | Freq: Once | ORAL | Status: AC
  Administered 2012-06-19: 800 mg via ORAL
  Filled 2012-06-19: qty 1

## 2012-06-19 MED ORDER — DEXTROSE 5 % IV SOLN
1.0000 g | Freq: Once | INTRAVENOUS | Status: AC
  Administered 2012-06-19: 1 g via INTRAVENOUS
  Filled 2012-06-19: qty 10

## 2012-06-19 MED ORDER — IBUPROFEN 600 MG PO TABS: 600.0000 mg | ORAL_TABLET | Freq: Four times a day (QID) | ORAL | Status: DC | PRN

## 2012-06-19 MED ORDER — CEPHALEXIN 500 MG PO CAPS: 500.0000 mg | ORAL_CAPSULE | Freq: Two times a day (BID) | ORAL | Status: DC

## 2012-06-19 NOTE — ED Notes (Signed)
Pt. Is for discharge , pt. Refused to be sent home at this time because "no body can pick her up " and she's been noted to be somnolent. MD notified, pt. Will be transferred to [facility] , for discharged in A.M.

## 2012-06-19 NOTE — ED Notes (Signed)
Pt states she is treated at methadone clinic for chronic pain,  Pt is having a hard time staying awake,  She says it is because of her pain medication and that she has fibromyalgia

## 2012-06-19 NOTE — ED Provider Notes (Signed)
History     CSN: [id number]  Arrival date & time 06/19/12  1906   First MD Initiated Contact with Patient 06/19/12 2010      Chief Complaint  Patient presents with  . Muscle Pain    (Consider location/radiation/quality/duration/timing/severity/associated sxs/prior treatment) HPI Comments: Patient is a 57 y/o F with PMHx chronic pain, fibromyalgia, uncontrolled DMII with complications c/o left wrist and left leg pain. Patient reported that left wrist pain started approximately 4 days ago, described as a sharp, shooting pain that is intermittent, lasts for a couple of seconds with radiation to the left hand and fingers, worse with motion - pain walks patient up in her sleep. Patient reported swelling. Denied injury or trauma to the wrist. Patient reported that left leg pain started approximately 4 days ago, reported that pain is sharp and shooting, intermittent with inflammation and swelling to the leg. Patient reported that walking makes the pain worse. Associated symptoms are headache and subjective fevers. Denied abdominal pain, trauma, injury, nausea, vomiting, neck pain, back pain, visual disturbances, SOB, chest pain, difficulty breathing. Patient has h/o cellulitis.    Patient is a 57 y.o. female presenting with wrist pain and leg pain. The history is provided by the patient. No language interpreter was used.  Wrist Pain This is a new problem. The current episode started in the past 7 days. The problem occurs intermittently. The problem has been unchanged. Associated symptoms include a fever, headaches and joint swelling. Pertinent negatives include no abdominal pain, chest pain, chills, nausea, neck pain, visual change or vomiting. Exacerbated by: motion. She has tried nothing for the symptoms. The treatment provided no relief.  Leg Pain Location:  Leg Time since incident:  4 days Injury: no   Leg location:  L leg Pain details:    Quality:  Shooting and sharp   Severity:  Moderate    Onset quality:  Gradual   Duration:  4 days   Timing:  Intermittent   Progression:  Unchanged Chronicity:  New Dislocation: no   Foreign body present:  No foreign bodies Prior injury to area:  No Relieved by:  Nothing Worsened by:  Activity Ineffective treatments:  None tried Associated symptoms: fever   Associated symptoms: no neck pain     Past Medical History  Diagnosis Date  . Diabetes mellitus   . Hypertension   . Arthritis   . History of drug abuse states quit herion in 2006  . Chronic back pain   . Frequency of urination   . Urge urinary incontinence   . Nocturia   . Edema of lower extremity history cellulitis jan 2013  . Methadone maintenance therapy patient   . Anxiety   . Depression   . History of chronic bronchitis   . Asthma   . Tendonitis     Past Surgical History  Procedure Laterality Date  . Appendectomy  1972  . Interstim implant placement  10/23/2011    Procedure: INTERSTIM IMPLANT FIRST STAGE;  Surgeon: Martina Sinner, MD;  Location: [facility];  Service: Urology;  Laterality: N/A;  . Interstim implant placement  10/23/2011    Procedure: INTERSTIM IMPLANT SECOND STAGE;  Surgeon: Martina Sinner, MD;  Location: [facility];  Service: Urology;  Laterality: N/A;    History reviewed. No pertinent family history.  History  Substance Use Topics  . Smoking status: Current Every Day Smoker -- 1.00 packs/day for 20 years    Types: Cigarettes  . Smokeless tobacco:  Never Used  . Alcohol Use: No    OB History   Grav Para Term Preterm Abortions TAB SAB Ect Mult Living                  Review of Systems  Constitutional: Positive for fever. Negative for chills.  HENT: Negative for neck pain.   Respiratory: Negative for chest tightness and shortness of breath.   Cardiovascular: Negative for chest pain.  Gastrointestinal: Negative for nausea, vomiting and abdominal pain.  Musculoskeletal: Positive for joint  swelling.  Neurological: Positive for headaches.  10 Systems reviewed and are negative for acute change except as noted in the HPI.   Allergies  Gabapentin and Vicodin  Home Medications   Current Outpatient Rx  Name  Route  Sig  Dispense  Refill  . albuterol (PROVENTIL HFA;VENTOLIN HFA) 108 (90 BASE) MCG/ACT inhaler   Inhalation   Inhale 2 puffs into the lungs every 6 (six) hours as needed. For wheezing         . hydrochlorothiazide (HYDRODIURIL) 25 MG tablet   Oral   Take 25 mg by mouth every morning.          . insulin glargine (LANTUS) 100 UNIT/ML injection   Subcutaneous   Inject 10 Units into the skin 2 (two) times daily.          . meloxicam (MOBIC) 15 MG tablet   Oral   Take 15 mg by mouth daily.         . metFORMIN (GLUCOPHAGE) 1000 MG tablet   Oral   Take 1,000 mg by mouth daily.          [name] METHADONE HCL PO   Oral   Take 108 mg by mouth every morning.          . Multiple Vitamin (MULITIVITAMIN WITH MINERALS) TABS   Oral   Take 1 tablet by mouth daily.          . pregabalin (LYRICA) 200 MG capsule   Oral   Take 200 mg by mouth 2 (two) times daily.         . cephALEXin (KEFLEX) 500 MG capsule   Oral   Take 1 capsule (500 mg total) by mouth 2 (two) times daily.   20 capsule   0   . ibuprofen (ADVIL,MOTRIN) 600 MG tablet   Oral   Take 1 tablet (600 mg total) by mouth every 6 (six) hours as needed for pain.   30 tablet   0     BP 135/68  Pulse 91  Temp(Src) 98.3 F (36.8 C) (Oral)  Resp 20  Wt 166 lb 1.6 oz (75.342 kg)  BMI 29.43 kg/m2  SpO2 95%  Physical Exam  Nursing note and vitals reviewed. Constitutional: She appears well-developed and well-nourished.  HENT:  Nose: Nose normal.  Mouth/Throat: No oropharyngeal exudate.  Eyes: Conjunctivae and EOM are normal. Pupils are equal, round, and reactive to light. Right eye exhibits no discharge. Left eye exhibits no discharge.  Neck: Normal range of motion. Neck supple.   Cardiovascular: Normal rate, regular rhythm, normal heart sounds and intact distal pulses.  Exam reveals no friction rub.   No murmur heard. Pulmonary/Chest: Effort normal and breath sounds normal. She has no wheezes. She has no rales.  Abdominal: Soft. Bowel sounds are normal. She exhibits no distension. There is no tenderness. There is no rebound.  Musculoskeletal: Normal range of motion. She exhibits edema and tenderness.  Left wrist: Mild swelling to  wrist with mild pain upon palpation. Patient is able to flex, extend, ulnar deviate, radial deviate. Mild pain with pronation, supination. Mild erythema to left wrist.  Left leg: Mild erythema circumferential to left leg from mid-shin to ankle. Mild pain upon palpation. Full ROM without pain.  Peripheral pulses palpable. Sensation intact.  Lymphadenopathy:    She has no cervical adenopathy.  Neurological: She is alert.  Skin: Skin is warm and dry. No rash noted. She is not diaphoretic. No erythema.  No bruising or sign of trauma.    ED Course  Procedures (including critical care time)  Labs Reviewed  CBC WITH DIFFERENTIAL - Abnormal; Notable for the following:    WBC 10.6 (*)    Neutrophils Relative 42 (*)    Lymphocytes Relative 49 (*)    Lymphs Abs 5.1 (*)    All other components within normal limits  COMPREHENSIVE METABOLIC PANEL - Abnormal; Notable for the following:    Sodium 132 (*)    Potassium 5.8 (*)    Glucose, Bld 292 (*)    Albumin 3.1 (*)    AST 48 (*)    GFR calc non Af Amer 73 (*)    GFR calc Af Amer 84 (*)    All other components within normal limits   Filed Vitals:   06/19/12 1937  BP: 135/68  Pulse: 91  Temp: 98.3 F (36.8 C)  Resp: 20     1. Wrist pain, left   2. History of fibromyalgia   3. Diabetes mellitus type 2, uncontrolled, with complications   4. Skin infection       MDM  Patient is a 57 y/o F PMHx of chronic pain, fibromyalgia, DMII, diabetic neuropathy, in methadone clinic  presenting with pain to the left wrist radiating to left hand and fingers - patient reported no trauma or injury. Described pain as intermittent episodes of sharp shooting pain that last seconds. Subjective fevers. Denied trauma or injury to wrist. Patient is a methadone maintenance patient.    I personally examined and evaluated patient.  PE: Alert. Mild swelling to left wrist and hand, full ROM with minimal pain. Mild swelling to lower left leg with mild circumferential erythema from mid-shin to ankle - mild warmth to the touch with mild pain. No neurovascular damaged noted.  Patient has history of cellulitis.  Gave patient Ibuprofen for pain. Ortho tech ordered for left wrist velcro brace placement.  Ordered CBC and CMP to r/o possible infection CBC: WBC high (10.6) Gave NS and Rocephin 1 g to patient.  Patient is afebrile, normotensive, non-tachycardic, alert and oriented.  Patient stayed in hospital for monitoring. Possible cellulitis to the lower left leg. Possible exacerbation of fibromyalgia/neuropathy DM leading to pain in left wrist and hand. Discharged patient, does not appear septic, non-toxic. Discharged patient with Keflex for skin infection and Ibuprofen for pain. Discussed to monitor the inflammation on left leg. Discussed with patient to stay in brace until seen by ortho - referred patient to ortho to see Monday (06/21/2012). Discussed to keep arm elevated and ice, decreased activity to left hand, rest. Discussed to continue home medications as prescribed, monitor blood glucose. Follow-up in outpatient with primary physician, patient has PCP. If symptoms worsen report back to the ED. Patient agreed to plan, understood, all questions answered.       Raymon Mutton, PA-C 06/20/12 0710

## 2012-06-20 NOTE — ED Provider Notes (Signed)
Medical screening examination/treatment/procedure(s) were performed by non-physician practitioner and as supervising physician I was immediately available for consultation/collaboration.   Jolanda Mccann L Bexleigh Theriault, MD 06/20/12 1306 

## 2012-06-30 ENCOUNTER — Emergency Department (HOSPITAL_COMMUNITY): Payer: PRIVATE HEALTH INSURANCE

## 2012-06-30 ENCOUNTER — Encounter (HOSPITAL_COMMUNITY): Payer: Self-pay | Admitting: *Deleted

## 2012-06-30 ENCOUNTER — Emergency Department (HOSPITAL_COMMUNITY)
Admission: EM | Admit: 2012-06-30 | Discharge: 2012-06-30 | Disposition: A | Discharge status: Home / Self Care | Payer: PRIVATE HEALTH INSURANCE | Attending: Emergency Medicine | Admitting: Emergency Medicine

## 2012-06-30 DIAGNOSIS — F3289 Other specified depressive episodes: Secondary | ICD-10-CM | POA: Insufficient documentation

## 2012-06-30 DIAGNOSIS — S4980XA Other specified injuries of shoulder and upper arm, unspecified arm, initial encounter: Secondary | ICD-10-CM | POA: Insufficient documentation

## 2012-06-30 DIAGNOSIS — J45909 Unspecified asthma, uncomplicated: Secondary | ICD-10-CM | POA: Insufficient documentation

## 2012-06-30 DIAGNOSIS — Y929 Unspecified place or not applicable: Secondary | ICD-10-CM | POA: Insufficient documentation

## 2012-06-30 DIAGNOSIS — W19XXXA Unspecified fall, initial encounter: Secondary | ICD-10-CM

## 2012-06-30 DIAGNOSIS — M129 Arthropathy, unspecified: Secondary | ICD-10-CM | POA: Insufficient documentation

## 2012-06-30 DIAGNOSIS — S81009A Unspecified open wound, unspecified knee, initial encounter: Secondary | ICD-10-CM | POA: Insufficient documentation

## 2012-06-30 DIAGNOSIS — W1809XA Striking against other object with subsequent fall, initial encounter: Secondary | ICD-10-CM | POA: Insufficient documentation

## 2012-06-30 DIAGNOSIS — IMO0002 Reserved for concepts with insufficient information to code with codable children: Secondary | ICD-10-CM

## 2012-06-30 DIAGNOSIS — I10 Essential (primary) hypertension: Secondary | ICD-10-CM | POA: Insufficient documentation

## 2012-06-30 DIAGNOSIS — G8929 Other chronic pain: Secondary | ICD-10-CM | POA: Insufficient documentation

## 2012-06-30 DIAGNOSIS — Z79899 Other long term (current) drug therapy: Secondary | ICD-10-CM | POA: Insufficient documentation

## 2012-06-30 DIAGNOSIS — F17200 Nicotine dependence, unspecified, uncomplicated: Secondary | ICD-10-CM | POA: Insufficient documentation

## 2012-06-30 DIAGNOSIS — F411 Generalized anxiety disorder: Secondary | ICD-10-CM | POA: Insufficient documentation

## 2012-06-30 DIAGNOSIS — W010XXA Fall on same level from slipping, tripping and stumbling without subsequent striking against object, initial encounter: Secondary | ICD-10-CM | POA: Insufficient documentation

## 2012-06-30 DIAGNOSIS — Y9389 Activity, other specified: Secondary | ICD-10-CM | POA: Insufficient documentation

## 2012-06-30 DIAGNOSIS — E119 Type 2 diabetes mellitus without complications: Secondary | ICD-10-CM | POA: Insufficient documentation

## 2012-06-30 DIAGNOSIS — Z8739 Personal history of other diseases of the musculoskeletal system and connective tissue: Secondary | ICD-10-CM | POA: Insufficient documentation

## 2012-06-30 DIAGNOSIS — Z8709 Personal history of other diseases of the respiratory system: Secondary | ICD-10-CM | POA: Insufficient documentation

## 2012-06-30 DIAGNOSIS — F329 Major depressive disorder, single episode, unspecified: Secondary | ICD-10-CM | POA: Insufficient documentation

## 2012-06-30 DIAGNOSIS — Z794 Long term (current) use of insulin: Secondary | ICD-10-CM | POA: Insufficient documentation

## 2012-06-30 DIAGNOSIS — Z87448 Personal history of other diseases of urinary system: Secondary | ICD-10-CM | POA: Insufficient documentation

## 2012-06-30 DIAGNOSIS — S46909A Unspecified injury of unspecified muscle, fascia and tendon at shoulder and upper arm level, unspecified arm, initial encounter: Secondary | ICD-10-CM | POA: Insufficient documentation

## 2012-06-30 NOTE — ED Notes (Addendum)
Pt reports tripping on corner of bed and falling onto rt shoulder and lac on left lower extremity.   Lac on leg is approx 2 inches and bleeding is controlled.  RN rewrapped lower extremity.  Pt reports pain in both areas to be a dull ache 8/10.  Pt has full ROM but states moving the rt shoulder is painful.  No obvious deformity noted by RN.  Pt alert oriented X4

## 2012-06-30 NOTE — ED Notes (Signed)
Pt reports getting out of bed early this am and tripping, has laceration to left anterior lower leg, bleeding controlled. And also mild pain to right shoulder. No acute distress noted at triage.

## 2012-06-30 NOTE — ED Provider Notes (Signed)
Medical screening examination/treatment/procedure(s) were performed by non-physician practitioner and as supervising physician I was immediately available for consultation/collaboration.   Carleene Cooper III, MD 06/30/12 2137

## 2012-06-30 NOTE — ED Provider Notes (Signed)
History     CSN: [id number]  Arrival date & time 06/30/12  1191   First MD Initiated Contact with Patient 06/30/12 0940      Chief Complaint  Patient presents with  . Leg Injury    (Consider location/radiation/quality/duration/timing/severity/associated sxs/prior treatment) HPI  SUBJECTIVE:  57 y.o. female sustained laceration of leg 5 hours ago. Ashby Dawes of injury: patient tripped and hit lher left shin against a heater.  She also injured her right shoulder. Denies hitting her head or loosing consciousness. Tetanus vaccination status reviewed: last tetanus booster 3 years ago.PMH sig for DM, Chronic pain and metahdones dependance, Peipheral edema.    Past Medical History  Diagnosis Date  . Diabetes mellitus   . Hypertension   . Arthritis   . History of drug abuse states quit herion in 2006  . Chronic back pain   . Frequency of urination   . Urge urinary incontinence   . Nocturia   . Edema of lower extremity history cellulitis jan 2013  . Methadone maintenance therapy patient   . Anxiety   . Depression   . History of chronic bronchitis   . Asthma   . Tendonitis     Past Surgical History  Procedure Laterality Date  . Appendectomy  1972  . Interstim implant placement  10/23/2011    Procedure: INTERSTIM IMPLANT FIRST STAGE;  Surgeon: Martina Sinner, MD;  Location: [facility];  Service: Urology;  Laterality: N/A;  . Interstim implant placement  10/23/2011    Procedure: INTERSTIM IMPLANT SECOND STAGE;  Surgeon: Martina Sinner, MD;  Location: [facility];  Service: Urology;  Laterality: N/A;    History reviewed. No pertinent family history.  History  Substance Use Topics  . Smoking status: Current Every Day Smoker -- 1.00 packs/day for 20 years    Types: Cigarettes  . Smokeless tobacco: Never Used  . Alcohol Use: No    OB History   Grav Para Term Preterm Abortions TAB SAB Ect Mult Living                  Review of Systems   Constitutional: Negative for fever and chills.  HENT: Negative for neck pain.   Respiratory: Negative for chest tightness and shortness of breath.   Cardiovascular: Positive for leg swelling (chronic).  Gastrointestinal: Negative for abdominal pain.  Musculoskeletal: Positive for arthralgias (shoulder pain).  Skin: Positive for wound.  Neurological: Negative for dizziness, syncope, weakness and headaches.    Allergies  Gabapentin and Vicodin  Home Medications   Current Outpatient Rx  Name  Route  Sig  Dispense  Refill  . albuterol (PROVENTIL HFA;VENTOLIN HFA) 108 (90 BASE) MCG/ACT inhaler   Inhalation   Inhale 2 puffs into the lungs every 6 (six) hours as needed. For wheezing         . cephALEXin (KEFLEX) 500 MG capsule   Oral   Take 1 capsule (500 mg total) by mouth 2 (two) times daily.   20 capsule   0   . hydrochlorothiazide (HYDRODIURIL) 25 MG tablet   Oral   Take 25 mg by mouth every morning.          [name] ibuprofen (ADVIL,MOTRIN) 600 MG tablet   Oral   Take 1 tablet (600 mg total) by mouth every 6 (six) hours as needed for pain.   30 tablet   0   . insulin glargine (LANTUS) 100 UNIT/ML injection   Subcutaneous   Inject 10 Units  into the skin 2 (two) times daily.          . meloxicam (MOBIC) 15 MG tablet   Oral   Take 15 mg by mouth daily.         . metFORMIN (GLUCOPHAGE) 1000 MG tablet   Oral   Take 1,000 mg by mouth daily.          [name] METHADONE HCL PO   Oral   Take 108 mg by mouth every morning.          . Multiple Vitamin (MULITIVITAMIN WITH MINERALS) TABS   Oral   Take 1 tablet by mouth daily.          . pregabalin (LYRICA) 200 MG capsule   Oral   Take 200 mg by mouth 2 (two) times daily.           BP 147/80  Pulse 73  Temp(Src) 98.2 F (36.8 C) (Oral)  Resp 16  SpO2 100%  Physical Exam  Nursing note and vitals reviewed. Constitutional: She is oriented to person, place, and time. She appears well-developed. No distress.   Appears older than stated age  HENT:  Head: Normocephalic and atraumatic.  Eyes: Conjunctivae are normal. No scleral icterus.  Neck: Normal range of motion.  Cardiovascular: Normal rate, regular rhythm and normal heart sounds.  Exam reveals no gallop and no friction rub.   No murmur heard. Pulmonary/Chest: Effort normal and breath sounds normal. No respiratory distress.  Abdominal: Soft. Bowel sounds are normal. She exhibits no distension and no mass. There is no tenderness. There is no guarding.  Musculoskeletal:  TTP Right shoulder, ROM limited by pain.    Neurological: She is alert and oriented to person, place, and time.  Skin: Skin is warm and dry. She is not diaphoretic.  3.0 cm laceration anterior shin    ED Course  Procedures (including critical care time) LACERATION REPAIR Performed by: Arthor Captain Authorized by: Arthor Captain Consent: Verbal consent obtained. Risks and benefits: risks, benefits and alternatives were discussed Consent given by: patient Patient identity confirmed: provided demographic data Prepped and Draped in normal sterile fashion Wound explored  Laceration Location: leg  Laceration Length: 4 cm  No Foreign Bodies seen or palpated  Anesthesia: local infiltration  Local anesthetic: lidocaine 2 % with epinephrine  Anesthetic total: 3 ml  Irrigation method: syringe Amount of cleaning: standard  Skin closure: 4.0 prolene  Number of sutures: 6  Technique: RI  Patient tolerance: Patient tolerated the procedure well with no immediate complications.   Labs Reviewed - No data to display No results found.   1. Fall, initial encounter   2. Laceration       MDM  Patient with fall and laceration to the shin.  Legs show signs of chronic peripheral edema.  Peripheral pulses are palpable.  Patient will go for xray of the right shoulder  As she is TTP over the scapula.    12:08 PM Patient is without fracture of the shoulder.  She  may take advil at home. Tdap booster given.Pressure irrigation performed. Laceration occurred < 8 hours prior to repair which was well tolerated. Pt has no co morbidities to effect normal wound healing. Discussed suture home care w pt and answered questions. Pt to f-u for wound check and suture removal in 7 days. Pt is hemodynamically stable w no complaints prior to dc.         Arthor Captain, PA-C 06/30/12 1239

## 2012-08-20 ENCOUNTER — Emergency Department (HOSPITAL_COMMUNITY)
Admission: EM | Admit: 2012-08-20 | Discharge: 2012-08-20 | Disposition: A | Discharge status: Home / Self Care | Payer: PRIVATE HEALTH INSURANCE | Attending: Emergency Medicine | Admitting: Emergency Medicine

## 2012-08-20 ENCOUNTER — Emergency Department (HOSPITAL_COMMUNITY): Payer: PRIVATE HEALTH INSURANCE

## 2012-08-20 ENCOUNTER — Encounter (HOSPITAL_COMMUNITY): Payer: Self-pay | Admitting: Cardiology

## 2012-08-20 DIAGNOSIS — Z8659 Personal history of other mental and behavioral disorders: Secondary | ICD-10-CM | POA: Insufficient documentation

## 2012-08-20 DIAGNOSIS — G5631 Lesion of radial nerve, right upper limb: Secondary | ICD-10-CM

## 2012-08-20 DIAGNOSIS — E119 Type 2 diabetes mellitus without complications: Secondary | ICD-10-CM | POA: Insufficient documentation

## 2012-08-20 DIAGNOSIS — Z794 Long term (current) use of insulin: Secondary | ICD-10-CM | POA: Insufficient documentation

## 2012-08-20 DIAGNOSIS — R5383 Other fatigue: Secondary | ICD-10-CM

## 2012-08-20 DIAGNOSIS — G8929 Other chronic pain: Secondary | ICD-10-CM | POA: Insufficient documentation

## 2012-08-20 DIAGNOSIS — I10 Essential (primary) hypertension: Secondary | ICD-10-CM | POA: Insufficient documentation

## 2012-08-20 DIAGNOSIS — M7989 Other specified soft tissue disorders: Secondary | ICD-10-CM

## 2012-08-20 DIAGNOSIS — R5381 Other malaise: Secondary | ICD-10-CM

## 2012-08-20 DIAGNOSIS — Z8739 Personal history of other diseases of the musculoskeletal system and connective tissue: Secondary | ICD-10-CM | POA: Insufficient documentation

## 2012-08-20 DIAGNOSIS — Z8709 Personal history of other diseases of the respiratory system: Secondary | ICD-10-CM | POA: Insufficient documentation

## 2012-08-20 DIAGNOSIS — F17200 Nicotine dependence, unspecified, uncomplicated: Secondary | ICD-10-CM | POA: Insufficient documentation

## 2012-08-20 DIAGNOSIS — Z791 Long term (current) use of non-steroidal anti-inflammatories (NSAID): Secondary | ICD-10-CM | POA: Insufficient documentation

## 2012-08-20 DIAGNOSIS — R279 Unspecified lack of coordination: Secondary | ICD-10-CM | POA: Insufficient documentation

## 2012-08-20 DIAGNOSIS — Z79899 Other long term (current) drug therapy: Secondary | ICD-10-CM | POA: Insufficient documentation

## 2012-08-20 DIAGNOSIS — Z87448 Personal history of other diseases of urinary system: Secondary | ICD-10-CM | POA: Insufficient documentation

## 2012-08-20 DIAGNOSIS — E1165 Type 2 diabetes mellitus with hyperglycemia: Secondary | ICD-10-CM

## 2012-08-20 DIAGNOSIS — J45909 Unspecified asthma, uncomplicated: Secondary | ICD-10-CM | POA: Insufficient documentation

## 2012-08-20 DIAGNOSIS — Z872 Personal history of diseases of the skin and subcutaneous tissue: Secondary | ICD-10-CM | POA: Insufficient documentation

## 2012-08-20 DIAGNOSIS — M129 Arthropathy, unspecified: Secondary | ICD-10-CM | POA: Insufficient documentation

## 2012-08-20 DIAGNOSIS — G5630 Lesion of radial nerve, unspecified upper limb: Secondary | ICD-10-CM | POA: Insufficient documentation

## 2012-08-20 LAB — BASIC METABOLIC PANEL
BUN: 16 mg/dL (ref 6–23)
CO2: 26 mEq/L (ref 19–32)
Calcium: 8.6 mg/dL (ref 8.4–10.5)
Chloride: 95 mEq/L — ABNORMAL LOW (ref 96–112)
Creatinine, Ser: 1.09 mg/dL (ref 0.50–1.10)
GFR calc Af Amer: 65 mL/min — ABNORMAL LOW (ref >90)
GFR calc non Af Amer: 56 mL/min — ABNORMAL LOW (ref >90)
Glucose, Bld: 426 mg/dL — ABNORMAL HIGH (ref 70–99)
Potassium: 4.5 mEq/L (ref 3.5–5.1)
Sodium: 132 mEq/L — ABNORMAL LOW (ref 135–145)

## 2012-08-20 LAB — CBC
HCT: 37.7 % (ref 36.0–46.0)
Hemoglobin: 12.2 g/dL (ref 12.0–15.0)
MCH: 26 pg (ref 26.0–34.0)
MCHC: 32.4 g/dL (ref 30.0–36.0)
MCV: 80.4 fL (ref 78.0–100.0)
Platelets: 145 10*3/uL — ABNORMAL LOW (ref 150–400)
RBC: 4.69 MIL/uL (ref 3.87–5.11)
RDW: 13.3 % (ref 11.5–15.5)
WBC: 8.1 10*3/uL (ref 4.0–10.5)

## 2012-08-20 LAB — PROTIME-INR
INR: 1.13 (ref 0.00–1.49)
Prothrombin Time: 14.3 seconds (ref 11.6–15.2)

## 2012-08-20 MED ORDER — SODIUM CHLORIDE 0.9 % IV BOLUS (SEPSIS): 1000.0000 mL | Freq: Once | INTRAVENOUS | Status: DC

## 2012-08-20 NOTE — Consult Note (Signed)
NEURO HOSPITALIST CONSULT NOTE    Reason for Consult: right hand weakness.  HPI:                                                                                                                                          Rose [name] is an 57 y.o. female, right handed, with a past medical history significant for HTN, DM, chronic pain syndrome, drug abuse on methadone maintenance therapy, anxiety, depression, asthma, presents to [facility] with right hand weakness. Stated that this never happened before, but she woke up Wednesday morning with inability to use his right hand. She said that there was some swelling of the arm but denies pain or paresthesias of the right hand. No neck pain or shoulder pain, No recent trauma neck, shoulder, or arm. Denies headache, vertigo, double vision, slurred speech, language or vision impairment.    Past Medical History  Diagnosis Date  . Diabetes mellitus   . Hypertension   . Arthritis   . History of drug abuse states quit herion in 2006  . Chronic back pain   . Frequency of urination   . Urge urinary incontinence   . Nocturia   . Edema of lower extremity history cellulitis jan 2013  . Methadone maintenance therapy patient   . Anxiety   . Depression   . History of chronic bronchitis   . Asthma   . Tendonitis     Past Surgical History  Procedure Laterality Date  . Appendectomy  1972  . Interstim implant placement  10/23/2011    Procedure: INTERSTIM IMPLANT FIRST STAGE;  Surgeon: Martina Sinner, MD;  Location: [facility];  Service: Urology;  Laterality: N/A;  . Interstim implant placement  10/23/2011    Procedure: INTERSTIM IMPLANT SECOND STAGE;  Surgeon: Martina Sinner, MD;  Location: [facility];  Service: Urology;  Laterality: N/A;    History reviewed. No pertinent family history.   Social History:  reports that she has been smoking Cigarettes.  She has a 20 pack-year smoking history.  She has never used smokeless tobacco. She reports that she does not drink alcohol or use illicit drugs.  Allergies  Allergen Reactions  . Gabapentin Other (See Comments)    Only when takes over 300 mg dose. "gives me the shakes"  . Vicodin (Hydrocodone-Acetaminophen) Diarrhea and Other (See Comments)    Stomach  cramps    MEDICATIONS:  I have reviewed the patient's current medications.   ROS:                                                                                                                                       History obtained from the patient and chart review.  General ROS: negative for - chills, fatigue, fever, night sweats, weight gain or weight loss Psychological ROS: negative for - behavioral disorder, hallucinations, memory difficulties, ideation Ophthalmic ROS: negative for - blurry vision, double vision, eye pain or loss of vision ENT ROS: negative for - epistaxis, nasal discharge, oral lesions, sore throat, tinnitus or vertigo Allergy and Immunology ROS: negative for - hives or itchy/watery eyes Hematological and Lymphatic ROS: negative for - bleeding problems, bruising or swollen lymph nodes Endocrine ROS: negative for - galactorrhea, hair pattern changes, polydipsia/polyuria or temperature intolerance Respiratory ROS: negative for - cough, hemoptysis, shortness of breath or wheezing Cardiovascular ROS: negative for - chest pain, dyspnea on exertion, edema or irregular heartbeat Gastrointestinal ROS: negative for - abdominal pain, diarrhea, hematemesis, nausea/vomiting or stool incontinence Genito-Urinary ROS: negative for - dysuria, hematuria, incontinence or urinary frequency/urgency Musculoskeletal ROS: negative for - joint swelling Neurological ROS: as noted in HPI Dermatological ROS: negative for rash and skin lesion changes   Physical exam:  pleasant female in no apparent distress. Head: normocephalic. Neck: supple, no bruits, no JVD. Cardiac: no murmurs. Lungs: clear. Abdomen: soft, no tender, no mass. Extremities: no edema.  Blood pressure 137/103, pulse 66, temperature 98.1 F (36.7 C), temperature source Oral, resp. rate 14, SpO2 99.00%.   Neurologic Examination:                                                                                                      Mental Status: Alert, oriented, but seems to be lethargic, thought content appropriate.  Speech fluent without evidence of aphasia.  Able to follow 3 step commands without difficulty. Cranial Nerves: II: Discs flat bilaterally; Visual fields grossly normal, pupils equal, round, reactive to light and accommodation III,IV, VI: ptosis not present, extra-ocular motions intact bilaterally V,VII: smile symmetric, facial light touch sensation normal bilaterally VIII: hearing normal bilaterally IX,X: gag reflex present XI: bilateral shoulder shrug XII: midline tongue extension Motor: Weakness that seems to be confined to the right wrist dorsiflexors and flexor carpi ulnaris, but she is difficult to examine because of excessive sleepiness/lethargy. Sensory: Pinprick and light touch intact throughout, bilaterally Deep Tendon Reflexes: 2+ and symmetric throughout Plantars: Right: downgoing  Left: downgoing Cerebellar: normal finger-to-nose, heel-to-shin test no tested.  Gait: no tested. CV: pulses palpable throughout    No results found for this basename: cbc, bmp, coags, chol, tri, ldl, hga1c    Results for orders placed during the hospital encounter of 08/20/12 (from the past 48 hour(s))  CBC     Status: Abnormal   Collection Time    08/20/12  2:34 PM      Result Value Range   WBC 8.1  4.0 - 10.5 K/uL   RBC 4.69  3.87 - 5.11 MIL/uL   Hemoglobin 12.2  12.0 - 15.0 g/dL   HCT 16.1  09.6 - 04.5 %   MCV 80.4  78.0 - 100.0 fL   MCH 26.0  26.0 - 34.0 pg    MCHC 32.4  30.0 - 36.0 g/dL   RDW 40.9  81.1 - 91.4 %   Platelets 145 (*) 150 - 400 K/uL  BASIC METABOLIC PANEL     Status: Abnormal   Collection Time    08/20/12  2:34 PM      Result Value Range   Sodium 132 (*) 135 - 145 mEq/L   Potassium 4.5  3.5 - 5.1 mEq/L   Chloride 95 (*) 96 - 112 mEq/L   CO2 26  19 - 32 mEq/L   Glucose, Bld 426 (*) 70 - 99 mg/dL   BUN 16  6 - 23 mg/dL   Creatinine, Ser 7.82  0.50 - 1.10 mg/dL   Calcium 8.6  8.4 - 95.6 mg/dL   GFR calc non Af Amer 56 (*) >90 mL/min   GFR calc Af Amer 65 (*) >90 mL/min   Comment:            The eGFR has been calculated     using the CKD EPI equation.     This calculation has not been     validated in all clinical     situations.     eGFR's persistently     <90 mL/min signify     possible Chronic Kidney Disease.  PROTIME-INR     Status: None   Collection Time    08/20/12  6:05 PM      Result Value Range   Prothrombin Time 14.3  11.6 - 15.2 seconds   INR 1.13  0.00 - 1.49    No results found.   Assessment/Plan: 57 years old female presents with new onset weakness that seems to be confined to the right radial nerve.  Although a right radial nerve palsy is the most likely explanation for her weakness, she has several risk factors for stroke and her exam is limited by her excessive sleepiness. She can not have MRI but a CT brain should be able to reveal whether or not the weakness is due to a central cortical process, as her symptoms developed 2 days ago. Neurology on call tonight will make further recommendations pending results of CT brain.   Wyatt Portela ,MD [facility] [phone]  08/20/2012, 7:23 PM

## 2012-08-20 NOTE — ED Notes (Signed)
Pt states that she is on methadone for fibromyalgia, drug abuse and "burning in fingers" pt has sleepy affect during assessment

## 2012-08-20 NOTE — Progress Notes (Signed)
Pt. Has a medtronic 3058 interstim bladder stimulator. Pt doesn't have her control box w her to turn stimulator OFF as recommended by Medtr[employer] safety guidelines. I also am researching a few other factors slew rate and gauss to ensure safety also. I will be running all information by neuroradiologist for final clearance for scan also. Please call with any questions ext [phone] [facility]

## 2012-08-20 NOTE — ED Notes (Addendum)
Pt reports that 2 days ago she felt a pain running through her right arm. States that she then tried to move her arm and was unable to move it. States that she called her PCP office today and was told to coe in. States that her arm feels swollen and is still having pain. Denies any other neuro deficits. Pt A&Ox4. No distress noted. Pt with decreased grip strengths on the right side.

## 2012-08-20 NOTE — ED Provider Notes (Addendum)
Pt awoke with R arm weakness 2 days ago - is persistent and not involving face or legs.  She has been ambulatorya nd no problems with speech.  On my exam had R wrist weakness with dorsiflexion but no volarflexion.  She has strong grip and normal median and ulnar nerve function.  Normal strength at elbow and shoulder on R.  normsl neuro to legs.  Admits to prior heroind IVDU but clean X 3 years.  No fevers.  MRi to r/o stroke - suspect Radial nerve palsy otherwise.  The case was discussed with the neurologist Dr. Thad Ranger who agrees that the patient likely has a radial nerve palsy, CT scan is negative, the patient is unable to undergo MRI because of the implantable devices, recommends baby aspirin, immobilization and followup with neurology as outpatient routine.  Vida Roller, MD 08/20/12 1702  Vida Roller, MD 08/20/12 2014

## 2012-08-20 NOTE — ED Notes (Signed)
Per MRI pt has a bladder stimulator that is currently on. Per MRI , scanner will need be turned off and setting will need to be verified prior to pt being scanner. Pt has a remote but it is has home. Pt will be sent back to room at this time. ED MD made aware.

## 2012-08-20 NOTE — ED Notes (Signed)
Pt refusing IV at this time. Per ED MD.

## 2012-08-20 NOTE — ED Notes (Signed)
MD at bedside.- nuerology

## 2012-08-20 NOTE — ED Provider Notes (Signed)
History     CSN: [id number]  Arrival date & time 08/20/12  1420   First MD Initiated Contact with Patient 08/20/12 1607      Chief Complaint  Patient presents with  . Weakness    (Consider location/radiation/quality/duration/timing/severity/associated sxs/prior treatment) HPI Comments: 57 y.o. female who has a pmh of substance abuse, htn, anxiety, diabetes -- who presents to the Er w/ the cc of right hand weakness. Started two days ago. Never had symptoms like this prior. States BP has been high the last few weeks because she has been upset. No n/v chest pain.   Pt can no longer grip with her right hand. She states that a few days ago it was swollen. She does not have pain in her right hand. Her left hand is fine and without issues. She can no longer extend her right hand.   Patient is a 57 y.o. female presenting with general illness. The history is provided by the patient.  Illness Location:  Right arm Severity:  Mild Onset quality:  Sudden Duration:  2 days Timing:  Constant Progression:  Unchanged Chronicity:  New Associated symptoms: no abdominal pain, no chest pain, no cough, no diarrhea, no fatigue, no fever, no headaches, no rash, no vomiting and no wheezing     Past Medical History  Diagnosis Date  . Diabetes mellitus   . Hypertension   . Arthritis   . History of drug abuse states quit herion in 2006  . Chronic back pain   . Frequency of urination   . Urge urinary incontinence   . Nocturia   . Edema of lower extremity history cellulitis jan 2013  . Methadone maintenance therapy patient   . Anxiety   . Depression   . History of chronic bronchitis   . Asthma   . Tendonitis     Past Surgical History  Procedure Laterality Date  . Appendectomy  1972  . Interstim implant placement  10/23/2011    Procedure: INTERSTIM IMPLANT FIRST STAGE;  Surgeon: Martina Sinner, MD;  Location: [facility];  Service: Urology;  Laterality: N/A;  . Interstim  implant placement  10/23/2011    Procedure: INTERSTIM IMPLANT SECOND STAGE;  Surgeon: Martina Sinner, MD;  Location: [facility];  Service: Urology;  Laterality: N/A;    History reviewed. No pertinent family history.  History  Substance Use Topics  . Smoking status: Current Every Day Smoker -- 1.00 packs/day for 20 years    Types: Cigarettes  . Smokeless tobacco: Never Used  . Alcohol Use: No    OB History   Grav Para Term Preterm Abortions TAB SAB Ect Mult Living                  Review of Systems  Constitutional: Negative for fever, chills and fatigue.  HENT: Negative for facial swelling, drooling, neck pain and dental problem.   Eyes: Negative for pain, discharge and itching.  Respiratory: Negative for cough, choking, wheezing and stridor.   Cardiovascular: Negative for chest pain.  Gastrointestinal: Negative for vomiting, abdominal pain and diarrhea.  Endocrine: Negative for cold intolerance and heat intolerance.  Genitourinary: Negative for vaginal discharge, difficulty urinating and vaginal pain.  Skin: Negative for pallor and rash.  Neurological: Positive for weakness. Negative for dizziness, light-headedness and headaches.  Psychiatric/Behavioral: Negative for behavioral problems and agitation.    Allergies  Gabapentin and Vicodin  Home Medications   Current Outpatient Rx  Name  Route  Sig  Dispense  Refill  . albuterol (PROVENTIL HFA;VENTOLIN HFA) 108 (90 BASE) MCG/ACT inhaler   Inhalation   Inhale 2 puffs into the lungs every 6 (six) hours as needed. For wheezing         . cephALEXin (KEFLEX) 500 MG capsule   Oral   Take 1 capsule (500 mg total) by mouth 2 (two) times daily.   20 capsule   0   . hydrochlorothiazide (HYDRODIURIL) 25 MG tablet   Oral   Take 25 mg by mouth every morning.          [name] ibuprofen (ADVIL,MOTRIN) 600 MG tablet   Oral   Take 1 tablet (600 mg total) by mouth every 6 (six) hours as needed for pain.   30  tablet   0   . insulin glargine (LANTUS) 100 UNIT/ML injection   Subcutaneous   Inject 10 Units into the skin 2 (two) times daily.          . meloxicam (MOBIC) 15 MG tablet   Oral   Take 15 mg by mouth daily.         . metFORMIN (GLUCOPHAGE) 1000 MG tablet   Oral   Take 1,000 mg by mouth daily.          [name] METHADONE HCL PO   Oral   Take 108 mg by mouth every morning.          . Multiple Vitamin (MULITIVITAMIN WITH MINERALS) TABS   Oral   Take 1 tablet by mouth daily.          . pregabalin (LYRICA) 200 MG capsule   Oral   Take 200 mg by mouth 2 (two) times daily.           BP 136/70  Pulse 76  Temp(Src) 98.9 F (37.2 C) (Oral)  Resp 18  SpO2 98%  Physical Exam  Constitutional: She is oriented to person, place, and time. She appears well-developed. No distress.  HENT:  Head: Normocephalic and atraumatic.  Eyes: Pupils are equal, round, and reactive to light. Right eye exhibits no discharge. Left eye exhibits no discharge.  Neck: Neck supple. No tracheal deviation present.  Cardiovascular: Normal rate.  Exam reveals no gallop and no friction rub.   Pulmonary/Chest: No stridor. No respiratory distress. She has no wheezes.  Abdominal: Soft. She exhibits no distension. There is no tenderness. There is no rebound.  Musculoskeletal: She exhibits no edema.  Right hand is not cold. +2 radial pulses bilaterally.   Neurological: She is alert and oriented to person, place, and time. Coordination abnormal.  She is able to ambulate in room without issues. She cannot extender her right wrist. She had +2 pulses bilaterally, radial, and +2DP. Sensation is intact.   Skin: Skin is warm. She is not diaphoretic.    ED Course  Procedures (including critical care time)  Labs Reviewed  CBC - Abnormal; Notable for the following:    Platelets 145 (*)    All other components within normal limits  BASIC METABOLIC PANEL - Abnormal; Notable for the following:    Sodium 132 (*)     Chloride 95 (*)    Glucose, Bld 426 (*)    GFR calc non Af Amer 56 (*)    GFR calc Af Amer 65 (*)    All other components within normal limits  PROTIME-INR   Ct Head Wo Contrast  08/20/2012   *RADIOLOGY REPORT*  Clinical Data: Right hand numbness over the past 2 days.  Current history of hypertension, diabetes, chronic pain syndrome, and methadone therapy.  CT HEAD WITHOUT CONTRAST  Technique:  Contiguous axial images were obtained from the base of the skull through the vertex without contrast.  Comparison: None.  Findings: Ventricular system normal in size and appearance for age. No mass lesion.  No midline shift.  No acute hemorrhage or hematoma.  No extra-axial fluid collections.  No evidence of acute infarction.  No skull fracture or other focal osseous abnormality involving the skull.  Visualized paranasal sinuses, bilateral mastoid air cells, and bilateral middle ear cavities well-aerated.  Mild bilateral carotid siphon atherosclerosis.  IMPRESSION:  1.  No acute intracranial abnormality. 2.  Mild bilateral carotid siphon atherosclerosis.   Original Report Authenticated By: Hulan Saas, M.D.    ECG shows HR, 64, NSR, no inverted T waves or pathologic ST wave changes, similar to ECG from 06/13/2012.   MDM  Do not suspect vascular cause, good pulses, perfusing well. Suspect she had a radial nerve palsy vs stroke -- she has loss of extension in right wrist. Will get MRI to further evaluate. Pt not currently having HA.   MRI contraindicated in patient due to interstim implant. Thus, CT head is done and we have consulted neurology team.   Neurology team has evaluated pt, and CT head does not show acute pathology. Refer to neurology note, but in summary, neurology team does not think that pt has had stroke, they feel pt has radial nerve palsy and have recommended splint. Pt is told to f/u with pcp on Monday for re-evaluation of symptoms, which she states she will do.  1. Radial nerve  palsy, right             Bernadene Person, MD 08/21/12 1610

## 2012-08-20 NOTE — Progress Notes (Signed)
Patient remains lethargic.  Continued wrist drop on exam but has movement within all muscle groups.  Head CT reviewed and shows no acute changes.  Radial nerve palsy most likely based on examination and history.    Recommendations: 1.  Patient to have OT as an outpatient 2.  ASA 81 mg daily 3.  Follow up with PCP as an outpatient  Case discussed with Dr. Sherle Poe, MD [facility] [phone] 08/20/2012, 8:58PM

## 2012-08-20 NOTE — ED Notes (Signed)
MD at bedside. 

## 2012-08-21 NOTE — ED Provider Notes (Signed)
I saw and evaluated the patient, reviewed the resident's note and I agree with the findings and plan.  I personally saw and evaluated the EKG as well as the patient. Please seen at my attached note  Vida Roller, MD 08/21/12 [phone]

## 2012-08-24 LAB — GLUCOSE, CAPILLARY: Glucose-Capillary: 272 mg/dL — ABNORMAL HIGH (ref 70–99)

## 2012-09-15 ENCOUNTER — Emergency Department (HOSPITAL_COMMUNITY): Payer: PRIVATE HEALTH INSURANCE

## 2012-09-15 ENCOUNTER — Encounter (HOSPITAL_COMMUNITY): Payer: Self-pay | Admitting: Vascular Surgery

## 2012-09-15 ENCOUNTER — Observation Stay (HOSPITAL_COMMUNITY)
Admission: EM | Admit: 2012-09-15 | Discharge: 2012-09-16 | Disposition: A | Discharge status: Home / Self Care | Payer: PRIVATE HEALTH INSURANCE | Attending: Internal Medicine | Admitting: Internal Medicine

## 2012-09-15 DIAGNOSIS — F1121 Opioid dependence, in remission: Secondary | ICD-10-CM | POA: Insufficient documentation

## 2012-09-15 DIAGNOSIS — G9340 Encephalopathy, unspecified: Principal | ICD-10-CM

## 2012-09-15 DIAGNOSIS — N289 Disorder of kidney and ureter, unspecified: Secondary | ICD-10-CM

## 2012-09-15 DIAGNOSIS — Z794 Long term (current) use of insulin: Secondary | ICD-10-CM | POA: Insufficient documentation

## 2012-09-15 DIAGNOSIS — R739 Hyperglycemia, unspecified: Secondary | ICD-10-CM

## 2012-09-15 DIAGNOSIS — Z91199 Patient's noncompliance with other medical treatment and regimen due to unspecified reason: Secondary | ICD-10-CM | POA: Insufficient documentation

## 2012-09-15 DIAGNOSIS — R4182 Altered mental status, unspecified: Secondary | ICD-10-CM | POA: Insufficient documentation

## 2012-09-15 DIAGNOSIS — E1165 Type 2 diabetes mellitus with hyperglycemia: Secondary | ICD-10-CM

## 2012-09-15 DIAGNOSIS — N179 Acute kidney failure, unspecified: Secondary | ICD-10-CM

## 2012-09-15 DIAGNOSIS — I10 Essential (primary) hypertension: Secondary | ICD-10-CM | POA: Insufficient documentation

## 2012-09-15 DIAGNOSIS — E1365 Other specified diabetes mellitus with hyperglycemia: Secondary | ICD-10-CM

## 2012-09-15 DIAGNOSIS — Z9119 Patient's noncompliance with other medical treatment and regimen: Secondary | ICD-10-CM | POA: Insufficient documentation

## 2012-09-15 DIAGNOSIS — E118 Type 2 diabetes mellitus with unspecified complications: Secondary | ICD-10-CM

## 2012-09-15 DIAGNOSIS — Z79899 Other long term (current) drug therapy: Secondary | ICD-10-CM | POA: Insufficient documentation

## 2012-09-15 DIAGNOSIS — IMO0001 Reserved for inherently not codable concepts without codable children: Secondary | ICD-10-CM | POA: Insufficient documentation

## 2012-09-15 LAB — GLUCOSE, CAPILLARY
Glucose-Capillary: 227 mg/dL — ABNORMAL HIGH (ref 70–99)
Glucose-Capillary: 423 mg/dL — ABNORMAL HIGH (ref 70–99)

## 2012-09-15 LAB — COMPREHENSIVE METABOLIC PANEL
ALT: 27 U/L (ref 0–35)
AST: 43 U/L — ABNORMAL HIGH (ref 0–37)
Albumin: 3.7 g/dL (ref 3.5–5.2)
Alkaline Phosphatase: 116 U/L (ref 39–117)
BUN: 38 mg/dL — ABNORMAL HIGH (ref 6–23)
CO2: 23 mEq/L (ref 19–32)
Calcium: 8.9 mg/dL (ref 8.4–10.5)
Chloride: 97 mEq/L (ref 96–112)
Creatinine, Ser: 1.37 mg/dL — ABNORMAL HIGH (ref 0.50–1.10)
GFR calc Af Amer: 49 mL/min — ABNORMAL LOW (ref >90)
GFR calc non Af Amer: 43 mL/min — ABNORMAL LOW (ref >90)
Glucose, Bld: 439 mg/dL — ABNORMAL HIGH (ref 70–99)
Potassium: 4 mEq/L (ref 3.5–5.1)
Sodium: 133 mEq/L — ABNORMAL LOW (ref 135–145)
Total Bilirubin: 0.5 mg/dL (ref 0.3–1.2)
Total Protein: 7.7 g/dL (ref 6.0–8.3)

## 2012-09-15 LAB — URINALYSIS, ROUTINE W REFLEX MICROSCOPIC
Bilirubin Urine: NEGATIVE
Glucose, UA: >1000 mg/dL — AB
Hgb urine dipstick: NEGATIVE
Ketones, ur: NEGATIVE mg/dL
Nitrite: NEGATIVE
Protein, ur: NEGATIVE mg/dL
Specific Gravity, Urine: 1.024 (ref 1.005–1.030)
Urobilinogen, UA: 1 mg/dL (ref 0.0–1.0)
pH: 5.5 (ref 5.0–8.0)

## 2012-09-15 LAB — CBC
HCT: 37.7 % (ref 36.0–46.0)
Hemoglobin: 12.8 g/dL (ref 12.0–15.0)
MCH: 26.7 pg (ref 26.0–34.0)
MCHC: 34 g/dL (ref 30.0–36.0)
MCV: 78.5 fL (ref 78.0–100.0)
Platelets: 149 10*3/uL — ABNORMAL LOW (ref 150–400)
RBC: 4.8 MIL/uL (ref 3.87–5.11)
RDW: 13.3 % (ref 11.5–15.5)
WBC: 9.9 10*3/uL (ref 4.0–10.5)

## 2012-09-15 LAB — RAPID URINE DRUG SCREEN, HOSP PERFORMED
Amphetamines: NOT DETECTED
Barbiturates: NOT DETECTED
Benzodiazepines: NOT DETECTED
Cocaine: NOT DETECTED
Opiates: NOT DETECTED
Tetrahydrocannabinol: NOT DETECTED

## 2012-09-15 LAB — CBC WITH DIFFERENTIAL/PLATELET
Basophils Absolute: 0 10*3/uL (ref 0.0–0.1)
Basophils Relative: 0 % (ref 0–1)
Eosinophils Absolute: 0.1 10*3/uL (ref 0.0–0.7)
Eosinophils Relative: 1 % (ref 0–5)
HCT: 37.8 % (ref 36.0–46.0)
Hemoglobin: 12.7 g/dL (ref 12.0–15.0)
Lymphocytes Relative: 43 % (ref 12–46)
Lymphs Abs: 4 10*3/uL (ref 0.7–4.0)
MCH: 26.1 pg (ref 26.0–34.0)
MCHC: 33.6 g/dL (ref 30.0–36.0)
MCV: 77.8 fL — ABNORMAL LOW (ref 78.0–100.0)
Monocytes Absolute: 0.6 10*3/uL (ref 0.1–1.0)
Monocytes Relative: 7 % (ref 3–12)
Neutro Abs: 4.6 10*3/uL (ref 1.7–7.7)
Neutrophils Relative %: 49 % (ref 43–77)
Platelets: 165 10*3/uL (ref 150–400)
RBC: 4.86 MIL/uL (ref 3.87–5.11)
RDW: 13.2 % (ref 11.5–15.5)
WBC: 9.4 10*3/uL (ref 4.0–10.5)

## 2012-09-15 LAB — CREATININE, SERUM
Creatinine, Ser: 1.18 mg/dL — ABNORMAL HIGH (ref 0.50–1.10)
GFR calc Af Amer: 59 mL/min — ABNORMAL LOW (ref >90)
GFR calc non Af Amer: 51 mL/min — ABNORMAL LOW (ref >90)

## 2012-09-15 LAB — URINE MICROSCOPIC-ADD ON

## 2012-09-15 LAB — CARBOXYHEMOGLOBIN
Carboxyhemoglobin: 5.8 % (ref 0.5–1.5)
Methemoglobin: 1.3 % (ref 0.0–1.5)
O2 Saturation: 88.3 %
Total hemoglobin: 12.9 g/dL (ref 12.0–16.0)

## 2012-09-15 LAB — AMMONIA: Ammonia: 37 umol/L (ref 11–60)

## 2012-09-15 LAB — ETHANOL: Alcohol, Ethyl (B): <11 mg/dL (ref 0–11)

## 2012-09-15 MED ORDER — VITAMIN B-1 100 MG PO TABS
100.0000 mg | ORAL_TABLET | Freq: Every day | ORAL | Status: DC
  Administered 2012-09-15 – 2012-09-16 (×2): 100 mg via ORAL
  Filled 2012-09-15 (×2): qty 1

## 2012-09-15 MED ORDER — INSULIN ASPART 100 UNIT/ML ~~LOC~~ SOLN
0.0000 [IU] | Freq: Three times a day (TID) | SUBCUTANEOUS | Status: DC
  Administered 2012-09-16: 15 [IU] via SUBCUTANEOUS

## 2012-09-15 MED ORDER — SODIUM CHLORIDE 0.9 % IJ SOLN
3.0000 mL | Freq: Two times a day (BID) | INTRAMUSCULAR | Status: DC
  Administered 2012-09-15: 3 mL via INTRAVENOUS

## 2012-09-15 MED ORDER — ADULT MULTIVITAMIN W/MINERALS CH
1.0000 | ORAL_TABLET | Freq: Every day | ORAL | Status: DC
  Administered 2012-09-15 – 2012-09-16 (×2): 1 via ORAL
  Filled 2012-09-15 (×2): qty 1

## 2012-09-15 MED ORDER — ONDANSETRON HCL 4 MG/2ML IJ SOLN: 4.0000 mg | Freq: Four times a day (QID) | INTRAMUSCULAR | Status: DC | PRN

## 2012-09-15 MED ORDER — ONDANSETRON HCL 4 MG PO TABS: 4.0000 mg | ORAL_TABLET | Freq: Four times a day (QID) | ORAL | Status: DC | PRN

## 2012-09-15 MED ORDER — INSULIN ASPART 100 UNIT/ML ~~LOC~~ SOLN
4.0000 [IU] | Freq: Three times a day (TID) | SUBCUTANEOUS | Status: DC
Start: 2012-09-16 — End: 2012-09-16
  Administered 2012-09-16: 4 [IU] via SUBCUTANEOUS

## 2012-09-15 MED ORDER — SENNOSIDES-DOCUSATE SODIUM 8.6-50 MG PO TABS: 1.0000 | ORAL_TABLET | Freq: Every evening | ORAL | Status: DC | PRN

## 2012-09-15 MED ORDER — ACETAMINOPHEN 325 MG PO TABS: 650.0000 mg | ORAL_TABLET | Freq: Four times a day (QID) | ORAL | Status: DC | PRN

## 2012-09-15 MED ORDER — ACETAMINOPHEN 650 MG RE SUPP: 650.0000 mg | Freq: Four times a day (QID) | RECTAL | Status: DC | PRN

## 2012-09-15 MED ORDER — SODIUM CHLORIDE 0.9 % IV SOLN
1000.0000 mL | Freq: Once | INTRAVENOUS | Status: AC
  Administered 2012-09-15: 1000 mL via INTRAVENOUS

## 2012-09-15 MED ORDER — INSULIN GLARGINE 100 UNIT/ML ~~LOC~~ SOLN
10.0000 [IU] | Freq: Two times a day (BID) | SUBCUTANEOUS | Status: DC
  Administered 2012-09-15: 10 [IU] via SUBCUTANEOUS
  Filled 2012-09-15 (×2): qty 0.1

## 2012-09-15 MED ORDER — SODIUM CHLORIDE 0.9 % IV SOLN
1000.0000 mL | INTRAVENOUS | Status: DC
  Administered 2012-09-15: 1000 mL via INTRAVENOUS

## 2012-09-15 MED ORDER — PREGABALIN 25 MG PO CAPS
200.0000 mg | ORAL_CAPSULE | Freq: Two times a day (BID) | ORAL | Status: DC
  Administered 2012-09-15 – 2012-09-16 (×2): 200 mg via ORAL
  Filled 2012-09-15 (×2): qty 8

## 2012-09-15 MED ORDER — ATENOLOL 50 MG PO TABS
50.0000 mg | ORAL_TABLET | Freq: Every day | ORAL | Status: DC
  Administered 2012-09-16: 50 mg via ORAL
  Filled 2012-09-15 (×2): qty 1

## 2012-09-15 MED ORDER — INSULIN ASPART 100 UNIT/ML ~~LOC~~ SOLN
10.0000 [IU] | Freq: Once | SUBCUTANEOUS | Status: AC
  Administered 2012-09-15: 10 [IU] via INTRAVENOUS
  Filled 2012-09-15: qty 1

## 2012-09-15 MED ORDER — FOLIC ACID 1 MG PO TABS
1.0000 mg | ORAL_TABLET | Freq: Every day | ORAL | Status: DC
  Administered 2012-09-15 – 2012-09-16 (×2): 1 mg via ORAL
  Filled 2012-09-15 (×2): qty 1

## 2012-09-15 MED ORDER — ENOXAPARIN SODIUM 40 MG/0.4ML ~~LOC~~ SOLN
40.0000 mg | SUBCUTANEOUS | Status: DC
  Administered 2012-09-15: 40 mg via SUBCUTANEOUS
  Filled 2012-09-15 (×2): qty 0.4

## 2012-09-15 MED ORDER — HYDROCHLOROTHIAZIDE 25 MG PO TABS
25.0000 mg | ORAL_TABLET | Freq: Every morning | ORAL | Status: DC
Start: 2012-09-16 — End: 2012-09-16
  Administered 2012-09-16: 25 mg via ORAL
  Filled 2012-09-15: qty 1

## 2012-09-15 MED ORDER — SODIUM CHLORIDE 0.9 % IV SOLN
INTRAVENOUS | Status: DC
  Administered 2012-09-15: 22:00:00 via INTRAVENOUS

## 2012-09-15 MED ORDER — SODIUM CHLORIDE 0.9 % IV SOLN
INTRAVENOUS | Status: AC
  Administered 2012-09-15: 20:00:00 via INTRAVENOUS

## 2012-09-15 NOTE — ED Notes (Signed)
Pt states that she got up this am and went to get her methadone and she spoke to her coumselor told nurse that she had been up all night

## 2012-09-15 NOTE — ED Notes (Signed)
Pt reports to the ED for eval of altered mental status. Pt is a diabetic. CBG 356 mg/dl and pt reports she normally runs about 110 mg/dl. Pt AxO x4 however pt lethargic. Pt was found slumped over in her car asleep. Someone had called EMS earlier and not brought her in and she went into the store and got a piece of cake. Pt arousable to verbal stimuli. Pt ambulatory PTA. Pt NRS at a rate 60s.

## 2012-09-15 NOTE — Progress Notes (Signed)
Pt's brother took pt's medication home.

## 2012-09-15 NOTE — H&P (Signed)
[facility]          History and Physical    PCP:   Quitman Livings, MD   Chief Complaint:  lethargy  HPI: Patient is a 57 y/o woman with h/o DM, HTN and prior heroin use, now on methadone. She was found sleeping in her car, police was called and she was brought in for evaluation. She states she had just taken her methadone, went into a store where she felt groggy, so she came back out and decided to nap in her car. She remembers being awakened by police. She is fully alert and oriented at time of my exam. Work up in the ED reveals: normal CT Head, UA negative for UTI, and ARF with a Cr of 1.37. We have been asked to admit her for further evaluation and management.  Allergies:   Allergies  Allergen Reactions  . Gabapentin Other (See Comments)    Only when takes over 300 mg dose. "gives me the shakes"  . Vicodin (Hydrocodone-Acetaminophen) Diarrhea and Other (See Comments)    Stomach  cramps      Past Medical History  Diagnosis Date  . Diabetes mellitus   . Hypertension   . Arthritis   . History of drug abuse states quit herion in 2006  . Chronic back pain   . Frequency of urination   . Urge urinary incontinence   . Nocturia   . Edema of lower extremity history cellulitis jan 2013  . Methadone maintenance therapy patient   . Anxiety   . Depression   . History of chronic bronchitis   . Asthma   . Tendonitis     Past Surgical History  Procedure Laterality Date  . Appendectomy  1972  . Interstim implant placement  10/23/2011    Procedure: INTERSTIM IMPLANT FIRST STAGE;  Surgeon: Martina Sinner, MD;  Location: [facility];  Service: Urology;  Laterality: N/A;  . Interstim implant placement  10/23/2011    Procedure: INTERSTIM IMPLANT SECOND STAGE;  Surgeon: Martina Sinner, MD;  Location: [facility];  Service: Urology;  Laterality: N/A;    Prior to Admission medications   Medication Sig Start Date End Date Taking?  Authorizing Provider  albuterol (PROVENTIL HFA;VENTOLIN HFA) 108 (90 BASE) MCG/ACT inhaler Inhale 2 puffs into the lungs every 6 (six) hours as needed. For wheezing    Historical Provider, MD  atenolol (TENORMIN) 50 MG tablet Take 50 mg by mouth daily.    Historical Provider, MD  hydrochlorothiazide (HYDRODIURIL) 25 MG tablet Take 25 mg by mouth every morning.     Historical Provider, MD  insulin glargine (LANTUS) 100 UNIT/ML injection Inject 10 Units into the skin 2 (two) times daily.     Historical Provider, MD  meloxicam (MOBIC) 15 MG tablet Take 15 mg by mouth daily.    Historical Provider, MD  metFORMIN (GLUCOPHAGE) 1000 MG tablet Take 1,000 mg by mouth daily.     Historical Provider, MD  METHADONE HCL PO Take 105 mg by mouth every morning.     Historical Provider, MD  Multiple Vitamin (MULITIVITAMIN WITH MINERALS) TABS Take 1 tablet by mouth daily.     Historical Provider, MD  pregabalin (LYRICA) 200 MG capsule Take 200 mg by mouth 2 (two) times daily.    Historical Provider, MD    Social History:  reports that she has been smoking Cigarettes.  She has a 20 pack-year smoking history. She has never used smokeless tobacco. She reports that she  does not drink alcohol or use illicit drugs.  No family history on file.  Review of Systems:  Constitutional: Denies fever, chills, diaphoresis, appetite change and fatigue.  HEENT: Denies photophobia, eye pain, redness, hearing loss, ear pain, congestion, sore throat, rhinorrhea, sneezing, mouth sores, trouble swallowing, neck pain, neck stiffness and tinnitus.   Respiratory: Denies SOB, DOE, cough, chest tightness,  and wheezing.   Cardiovascular: Denies chest pain, palpitations and leg swelling.  Gastrointestinal: Denies nausea, vomiting, abdominal pain, diarrhea, constipation, blood in stool and abdominal distention.  Genitourinary: Denies dysuria, urgency, frequency, hematuria, flank pain and difficulty urinating.  Endocrine: Denies: hot or  cold intolerance, sweats, changes in hair or nails, polyuria, polydipsia. Musculoskeletal: Denies myalgias, back pain, joint swelling, arthralgias and gait problem.  Skin: Denies pallor, rash and wound.  Neurological: Denies dizziness, seizures, syncope, weakness, light-headedness, numbness and headaches.  Hematological: Denies adenopathy. Easy bruising, personal or family bleeding history  Psychiatric/Behavioral: Denies suicidal ideation, mood changes, confusion, nervousness, sleep disturbance and agitation   Physical Exam: Blood pressure 92/57, pulse 62, temperature 98 F (36.7 C), temperature source Oral, resp. rate 10, last menstrual period 07/31/2011, SpO2 96.00%. Gen: AA Ox3, NAD, HEENT: [place]/AT/PERRL/EOMI/dry mucous membranes Neck: Supple, no JVD, no bruits, no goiter. CV: RRR, no M/R/G Lungs: CTA B Abd: S/NT/ND/+BS/no masses or organomegaly noted. Ext: no C/C/E/+pedal pulses, small laceration to left shin, does not appear infected. Neuro: intact and non-focal.  Labs on Admission:  Results for orders placed during the hospital encounter of 09/15/12 (from the past 48 hour(s))  CBC WITH DIFFERENTIAL     Status: Abnormal   Collection Time    09/15/12  1:48 PM      Result Value Range   WBC 9.4  4.0 - 10.5 K/uL   RBC 4.86  3.87 - 5.11 MIL/uL   Hemoglobin 12.7  12.0 - 15.0 g/dL   HCT 16.1  09.6 - 04.5 %   MCV 77.8 (*) 78.0 - 100.0 fL   MCH 26.1  26.0 - 34.0 pg   MCHC 33.6  30.0 - 36.0 g/dL   RDW 40.9  81.1 - 91.4 %   Platelets 165  150 - 400 K/uL   Neutrophils Relative % 49  43 - 77 %   Neutro Abs 4.6  1.7 - 7.7 K/uL   Lymphocytes Relative 43  12 - 46 %   Lymphs Abs 4.0  0.7 - 4.0 K/uL   Monocytes Relative 7  3 - 12 %   Monocytes Absolute 0.6  0.1 - 1.0 K/uL   Eosinophils Relative 1  0 - 5 %   Eosinophils Absolute 0.1  0.0 - 0.7 K/uL   Basophils Relative 0  0 - 1 %   Basophils Absolute 0.0  0.0 - 0.1 K/uL  COMPREHENSIVE METABOLIC PANEL     Status: Abnormal   Collection  Time    09/15/12  1:48 PM      Result Value Range   Sodium 133 (*) 135 - 145 mEq/L   Potassium 4.0  3.5 - 5.1 mEq/L   Chloride 97  96 - 112 mEq/L   CO2 23  19 - 32 mEq/L   Glucose, Bld 439 (*) 70 - 99 mg/dL   BUN 38 (*) 6 - 23 mg/dL   Creatinine, Ser 7.82 (*) 0.50 - 1.10 mg/dL   Calcium 8.9  8.4 - 95.6 mg/dL   Total Protein 7.7  6.0 - 8.3 g/dL   Albumin 3.7  3.5 - 5.2 g/dL  AST 43 (*) 0 - 37 U/L   ALT 27  0 - 35 U/L   Alkaline Phosphatase 116  39 - 117 U/L   Total Bilirubin 0.5  0.3 - 1.2 mg/dL   GFR calc non Af Amer 43 (*) >90 mL/min   GFR calc Af Amer 49 (*) >90 mL/min   Comment:            The eGFR has been calculated     using the CKD EPI equation.     This calculation has not been     validated in all clinical     situations.     eGFR's persistently     <90 mL/min signify     possible Chronic Kidney Disease.  ETHANOL     Status: None   Collection Time    09/15/12  1:48 PM      Result Value Range   Alcohol, Ethyl (B) <11  0 - 11 mg/dL   Comment:            LOWEST DETECTABLE LIMIT FOR     SERUM ALCOHOL IS 11 mg/dL     FOR MEDICAL PURPOSES ONLY  AMMONIA     Status: None   Collection Time    09/15/12  1:49 PM      Result Value Range   Ammonia 37  11 - 60 umol/L  CARBOXYHEMOGLOBIN     Status: Abnormal   Collection Time    09/15/12  2:30 PM      Result Value Range   Total hemoglobin 12.9  12.0 - 16.0 g/dL   O2 Saturation 16.1     Carboxyhemoglobin 5.8 (*) 0.5 - 1.5 %   Comment: CRITICAL RESULT CALLED TO, READ BACK BY AND VERIFIED WITH:     PATRICK SWEENEY, RRT, RCP AT 1438, BY JAMES JOHNSON RRT,RCP ON 09/15/2012   Methemoglobin 1.3  0.0 - 1.5 %  URINALYSIS, ROUTINE W REFLEX MICROSCOPIC     Status: Abnormal   Collection Time    09/15/12  2:32 PM      Result Value Range   Color, Urine YELLOW  YELLOW   APPearance CLOUDY (*) CLEAR   Specific Gravity, Urine 1.024  1.005 - 1.030   pH 5.5  5.0 - 8.0   Glucose, UA >1000 (*) NEGATIVE mg/dL   Hgb urine dipstick  NEGATIVE  NEGATIVE   Bilirubin Urine NEGATIVE  NEGATIVE   Ketones, ur NEGATIVE  NEGATIVE mg/dL   Protein, ur NEGATIVE  NEGATIVE mg/dL   Urobilinogen, UA 1.0  0.0 - 1.0 mg/dL   Nitrite NEGATIVE  NEGATIVE   Leukocytes, UA TRACE (*) NEGATIVE  URINE RAPID DRUG SCREEN (HOSP PERFORMED)     Status: None   Collection Time    09/15/12  2:32 PM      Result Value Range   Opiates NONE DETECTED  NONE DETECTED   Cocaine NONE DETECTED  NONE DETECTED   Benzodiazepines NONE DETECTED  NONE DETECTED   Amphetamines NONE DETECTED  NONE DETECTED   Tetrahydrocannabinol NONE DETECTED  NONE DETECTED   Barbiturates NONE DETECTED  NONE DETECTED   Comment:            DRUG SCREEN FOR MEDICAL PURPOSES     ONLY.  IF CONFIRMATION IS NEEDED     FOR ANY PURPOSE, NOTIFY LAB     WITHIN 5 DAYS.                LOWEST DETECTABLE LIMITS  FOR URINE DRUG SCREEN     Drug Class       Cutoff (ng/mL)     Amphetamine      1000     Barbiturate      200     Benzodiazepine   200     Tricyclics       300     Opiates          300     Cocaine          300     THC              50  URINE MICROSCOPIC-ADD ON     Status: None   Collection Time    09/15/12  2:32 PM      Result Value Range   Squamous Epithelial / LPF RARE  RARE   WBC, UA 3-6  <3 WBC/hpf   Bacteria, UA RARE  RARE   Urine-Other MANY YEAST    GLUCOSE, CAPILLARY     Status: Abnormal   Collection Time    09/15/12  5:29 PM      Result Value Range   Glucose-Capillary 227 (*) 70 - 99 mg/dL   Comment 1 Notify RN     Comment 2 Documented in Chart      Radiological Exams on Admission: Ct Head Wo Contrast  09/15/2012   *RADIOLOGY REPORT*  Clinical Data: Altered mental status  CT HEAD WITHOUT CONTRAST  Technique:  Contiguous axial images were obtained from the base of the skull through the vertex without contrast.  Comparison: 08/20/2012  Findings: There is no mass effect, midline shift, or acute intracranial hemorrhage.  Mastoid air cells and visualized paranasal  sinuses are clear.  Mild global atrophy.  IMPRESSION: No acute intracranial pathology.   Original Report Authenticated By: Jolaine Click, M.D.    Assessment/Plan Principal Problem:   Acute encephalopathy Active Problems:   ARF (acute renal failure)   DM (diabetes mellitus), secondary uncontrolled   Acute Encephalopathy -Resolved. -Likely related to methadone dosing.  ARF -Likely prerenal in nature. -Will admit as obs for some IVF. -Follow renal function in am.  DM -Is hyperglycemic to >400. -She admits to not being complaint with her lantus. -Have explained consequences of undertreated DM. -Will continue lantus and place on SSI. -Hold metformin for now given degree of renal insufficiency.  Prior Heroin Use -Has been sober for 3 years. -Attends a methadone clinic.  DVT Prophylaxis -Lovenox.  Disposition -Anticipate DC home in am if renal function improved.  Time Spent on Admission: 75 minutes.  Chaya Jan [facility] Pager: [phone] 09/15/2012, 6:39 PM

## 2012-09-15 NOTE — ED Notes (Signed)
Dr. Alita Chyle, RN made aware of COOX panic results-5.8/Ph-7.29.

## 2012-09-15 NOTE — ED Provider Notes (Signed)
History     CSN: [id number]  Arrival date & time 09/15/12  1324   First MD Initiated Contact with Patient 09/15/12 1329      Chief Complaint  Patient presents with  . Altered Mental Status    (Consider location/radiation/quality/duration/timing/severity/associated sxs/prior treatment) Patient is a 57 y.o. female presenting with altered mental status. The history is provided by the patient. The history is limited by the condition of the patient (Altered mental status).  Altered Mental Status She was brought to the ED because she was found asleep in her car. Apparently this had happened in twice. She denies ethanol use and denies drug use.  Past Medical History  Diagnosis Date  . Diabetes mellitus   . Hypertension   . Arthritis   . History of drug abuse states quit herion in 2006  . Chronic back pain   . Frequency of urination   . Urge urinary incontinence   . Nocturia   . Edema of lower extremity history cellulitis jan 2013  . Methadone maintenance therapy patient   . Anxiety   . Depression   . History of chronic bronchitis   . Asthma   . Tendonitis     Past Surgical History  Procedure Laterality Date  . Appendectomy  1972  . Interstim implant placement  10/23/2011    Procedure: INTERSTIM IMPLANT FIRST STAGE;  Surgeon: Martina Sinner, MD;  Location: [facility];  Service: Urology;  Laterality: N/A;  . Interstim implant placement  10/23/2011    Procedure: INTERSTIM IMPLANT SECOND STAGE;  Surgeon: Martina Sinner, MD;  Location: [facility];  Service: Urology;  Laterality: N/A;    No family history on file.  History  Substance Use Topics  . Smoking status: Current Every Day Smoker -- 1.00 packs/day for 20 years    Types: Cigarettes  . Smokeless tobacco: Never Used  . Alcohol Use: No    OB History   Grav Para Term Preterm Abortions TAB SAB Ect Mult Living                  Review of Systems  Unable to perform ROS: Mental  status change  Psychiatric/Behavioral: Positive for altered mental status.    Allergies  Gabapentin and Vicodin  Home Medications   Current Outpatient Rx  Name  Route  Sig  Dispense  Refill  . albuterol (PROVENTIL HFA;VENTOLIN HFA) 108 (90 BASE) MCG/ACT inhaler   Inhalation   Inhale 2 puffs into the lungs every 6 (six) hours as needed. For wheezing         . atenolol (TENORMIN) 50 MG tablet   Oral   Take 50 mg by mouth daily.         . hydrochlorothiazide (HYDRODIURIL) 25 MG tablet   Oral   Take 25 mg by mouth every morning.          . insulin glargine (LANTUS) 100 UNIT/ML injection   Subcutaneous   Inject 10 Units into the skin 2 (two) times daily.          . meloxicam (MOBIC) 15 MG tablet   Oral   Take 15 mg by mouth daily.         . metFORMIN (GLUCOPHAGE) 1000 MG tablet   Oral   Take 1,000 mg by mouth daily.          [name] METHADONE HCL PO   Oral   Take 105 mg by mouth every morning.          [name]  Multiple Vitamin (MULITIVITAMIN WITH MINERALS) TABS   Oral   Take 1 tablet by mouth daily.          . pregabalin (LYRICA) 200 MG capsule   Oral   Take 200 mg by mouth 2 (two) times daily.           BP 109/49  Pulse 69  Temp(Src) 98.6 F (37 C) (Oral)  Resp 10  SpO2 93%  Physical Exam  Nursing note and vitals reviewed.  57 year old female, resting comfortably and in no acute distress. Vital signs are normal. Oxygen saturation is 93%, which is normal. Head is normocephalic and atraumatic. PERRLA, EOMI. Oropharynx is clear. Neck is nontender and supple without adenopathy or JVD. Back is nontender and there is no CVA tenderness. Lungs are clear without rales, wheezes, or rhonchi. Chest is nontender. Heart has regular rate and rhythm without murmur. Abdomen is soft, flat, nontender without masses or hepatosplenomegaly and peristalsis is normoactive. Extremities have no cyanosis or edema, full range of motion is present. Skin is warm and dry  without rash. Multiple lesions are present suggesting prior skin popping. Neurologic: She is somnolent but arousable. When aroused, she is oriented to person place and partly oriented to time-she knows the year but not and day, month, or date. Cranial nerves are intact, there are no gross motor or sensory deficits.  ED Course  Procedures (including critical care time)  Results for orders placed during the hospital encounter of 09/15/12  CBC WITH DIFFERENTIAL      Result Value Range   WBC 9.4  4.0 - 10.5 K/uL   RBC 4.86  3.87 - 5.11 MIL/uL   Hemoglobin 12.7  12.0 - 15.0 g/dL   HCT 16.1  09.6 - 04.5 %   MCV 77.8 (*) 78.0 - 100.0 fL   MCH 26.1  26.0 - 34.0 pg   MCHC 33.6  30.0 - 36.0 g/dL   RDW 40.9  81.1 - 91.4 %   Platelets 165  150 - 400 K/uL   Neutrophils Relative % 49  43 - 77 %   Neutro Abs 4.6  1.7 - 7.7 K/uL   Lymphocytes Relative 43  12 - 46 %   Lymphs Abs 4.0  0.7 - 4.0 K/uL   Monocytes Relative 7  3 - 12 %   Monocytes Absolute 0.6  0.1 - 1.0 K/uL   Eosinophils Relative 1  0 - 5 %   Eosinophils Absolute 0.1  0.0 - 0.7 K/uL   Basophils Relative 0  0 - 1 %   Basophils Absolute 0.0  0.0 - 0.1 K/uL  COMPREHENSIVE METABOLIC PANEL      Result Value Range   Sodium 133 (*) 135 - 145 mEq/L   Potassium 4.0  3.5 - 5.1 mEq/L   Chloride 97  96 - 112 mEq/L   CO2 23  19 - 32 mEq/L   Glucose, Bld 439 (*) 70 - 99 mg/dL   BUN 38 (*) 6 - 23 mg/dL   Creatinine, Ser 7.82 (*) 0.50 - 1.10 mg/dL   Calcium 8.9  8.4 - 95.6 mg/dL   Total Protein 7.7  6.0 - 8.3 g/dL   Albumin 3.7  3.5 - 5.2 g/dL   AST 43 (*) 0 - 37 U/L   ALT 27  0 - 35 U/L   Alkaline Phosphatase 116  39 - 117 U/L   Total Bilirubin 0.5  0.3 - 1.2 mg/dL   GFR calc non Af Amer 43 (*) >  90 mL/min   GFR calc Af Amer 49 (*) >90 mL/min  ETHANOL      Result Value Range   Alcohol, Ethyl (B) <11  0 - 11 mg/dL  CARBOXYHEMOGLOBIN      Result Value Range   Total hemoglobin 12.9  12.0 - 16.0 g/dL   O2 Saturation 40.9      Carboxyhemoglobin 5.8 (*) 0.5 - 1.5 %   Methemoglobin 1.3  0.0 - 1.5 %  AMMONIA      Result Value Range   Ammonia 37  11 - 60 umol/L  URINALYSIS, ROUTINE W REFLEX MICROSCOPIC      Result Value Range   Color, Urine YELLOW  YELLOW   APPearance CLOUDY (*) CLEAR   Specific Gravity, Urine 1.024  1.005 - 1.030   pH 5.5  5.0 - 8.0   Glucose, UA >1000 (*) NEGATIVE mg/dL   Hgb urine dipstick NEGATIVE  NEGATIVE   Bilirubin Urine NEGATIVE  NEGATIVE   Ketones, ur NEGATIVE  NEGATIVE mg/dL   Protein, ur NEGATIVE  NEGATIVE mg/dL   Urobilinogen, UA 1.0  0.0 - 1.0 mg/dL   Nitrite NEGATIVE  NEGATIVE   Leukocytes, UA TRACE (*) NEGATIVE  URINE RAPID DRUG SCREEN (HOSP PERFORMED)      Result Value Range   Opiates NONE DETECTED  NONE DETECTED   Cocaine NONE DETECTED  NONE DETECTED   Benzodiazepines NONE DETECTED  NONE DETECTED   Amphetamines NONE DETECTED  NONE DETECTED   Tetrahydrocannabinol NONE DETECTED  NONE DETECTED   Barbiturates NONE DETECTED  NONE DETECTED  URINE MICROSCOPIC-ADD ON      Result Value Range   Squamous Epithelial / LPF RARE  RARE   WBC, UA 3-6  <3 WBC/hpf   Bacteria, UA RARE  RARE   Urine-Other MANY YEAST     Ct Head Wo Contrast  09/15/2012   *RADIOLOGY REPORT*  Clinical Data: Altered mental status  CT HEAD WITHOUT CONTRAST  Technique:  Contiguous axial images were obtained from the base of the skull through the vertex without contrast.  Comparison: 08/20/2012  Findings: There is no mass effect, midline shift, or acute intracranial hemorrhage.  Mastoid air cells and visualized paranasal sinuses are clear.  Mild global atrophy.  IMPRESSION: No acute intracranial pathology.   Original Report Authenticated By: Jolaine Click, M.D.   Ct Head Wo Contrast  08/20/2012   *RADIOLOGY REPORT*  Clinical Data: Right hand numbness over the past 2 days.  Current history of hypertension, diabetes, chronic pain syndrome, and methadone therapy.  CT HEAD WITHOUT CONTRAST  Technique:  Contiguous axial  images were obtained from the base of the skull through the vertex without contrast.  Comparison: None.  Findings: Ventricular system normal in size and appearance for age. No mass lesion.  No midline shift.  No acute hemorrhage or hematoma.  No extra-axial fluid collections.  No evidence of acute infarction.  No skull fracture or other focal osseous abnormality involving the skull.  Visualized paranasal sinuses, bilateral mastoid air cells, and bilateral middle ear cavities well-aerated.  Mild bilateral carotid siphon atherosclerosis.  IMPRESSION:  1.  No acute intracranial abnormality. 2.  Mild bilateral carotid siphon atherosclerosis.   Original Report Authenticated By: Hulan Saas, M.D.      1. Altered mental status   2. Renal insufficiency   3. Hyperglycemia       MDM  Patient with altered mentation. Her affect is unusual and suggests that she may be using some recreational drugs. Screening tests including  a drug screen and alcohol level are all unremarkable. Carboxyhemoglobin was obtained because she was found in her car and it is elevated but not to a level that is expected among smokers and not a level that should cause altered mentation. Urine creatinine are slightly elevated relative to baseline and she is noted to have hyperglycemia. She has been given IV fluid plus insulin. Patient still is tending towards being somnolent and when awake, still with altered affect. She is not stable to be discharged. Case discussed with Dr. Ardyth Harps of [facility] who agrees to admit the patient.        Dione Booze, MD 09/15/12 [phone]

## 2012-09-15 NOTE — Progress Notes (Signed)
Pt's b/s is 423. Called night MD. Awaiting call back.

## 2012-09-15 NOTE — ED Notes (Signed)
PPPP

## 2012-09-15 NOTE — Progress Notes (Signed)
MD order is to hold b/p med and give the 10 of lantus.

## 2012-09-15 NOTE — ED Notes (Signed)
Pt's CBG is 227 mg/dl.RN [place] notified.

## 2012-09-15 NOTE — Progress Notes (Signed)
Pt admitted to the unit. Pt is stable, alert and oriented per baseline. Oriented to room, staff, and call bell. Educated to call for any assistance. Bed in lowest position, call bell within reach- will continue to monitor.Pt has decided to keep purse at side- $52 has been counted. Pt would like to keep money in her socks. NT Veroinca and RN watched pt place money in her sock. Medication taken from pt and counted. Will sent to pharmacy.

## 2012-09-15 NOTE — ED Notes (Signed)
Pt keeps falling asleep but awakens to voice and shake. Pt pleasant, voided on bedpan when told we needed ua assisted w/ that but went back to sleep

## 2012-09-15 NOTE — ED Notes (Signed)
Patient transported to CT 

## 2012-09-16 DIAGNOSIS — R4182 Altered mental status, unspecified: Secondary | ICD-10-CM

## 2012-09-16 LAB — BASIC METABOLIC PANEL
BUN: 29 mg/dL — ABNORMAL HIGH (ref 6–23)
CO2: 21 mEq/L (ref 19–32)
Calcium: 8.5 mg/dL (ref 8.4–10.5)
Chloride: 101 mEq/L (ref 96–112)
Creatinine, Ser: 1.01 mg/dL (ref 0.50–1.10)
GFR calc Af Amer: 71 mL/min — ABNORMAL LOW (ref >90)
GFR calc non Af Amer: 61 mL/min — ABNORMAL LOW (ref >90)
Glucose, Bld: 447 mg/dL — ABNORMAL HIGH (ref 70–99)
Potassium: 4 mEq/L (ref 3.5–5.1)
Sodium: 132 mEq/L — ABNORMAL LOW (ref 135–145)

## 2012-09-16 LAB — HEMOGLOBIN A1C
Hgb A1c MFr Bld: 10.2 % — ABNORMAL HIGH (ref <5.7)
Mean Plasma Glucose: 246 mg/dL — ABNORMAL HIGH (ref <117)

## 2012-09-16 LAB — CBC
HCT: 34.2 % — ABNORMAL LOW (ref 36.0–46.0)
Hemoglobin: 11.6 g/dL — ABNORMAL LOW (ref 12.0–15.0)
MCH: 26.7 pg (ref 26.0–34.0)
MCHC: 33.9 g/dL (ref 30.0–36.0)
MCV: 78.8 fL (ref 78.0–100.0)
Platelets: 157 10*3/uL (ref 150–400)
RBC: 4.34 MIL/uL (ref 3.87–5.11)
RDW: 13.4 % (ref 11.5–15.5)
WBC: 7.5 10*3/uL (ref 4.0–10.5)

## 2012-09-16 LAB — GLUCOSE, CAPILLARY: Glucose-Capillary: 400 mg/dL — ABNORMAL HIGH (ref 70–99)

## 2012-09-16 MED ORDER — METHADONE HCL 10 MG PO TABS: 40.0000 mg | ORAL_TABLET | Freq: Every day | ORAL | Status: DC

## 2012-09-16 MED ORDER — INSULIN GLARGINE 100 UNIT/ML ~~LOC~~ SOLN
18.0000 [IU] | Freq: Two times a day (BID) | SUBCUTANEOUS | Status: DC
  Administered 2012-09-16: 18 [IU] via SUBCUTANEOUS
  Filled 2012-09-16 (×2): qty 0.18

## 2012-09-16 MED ORDER — METHADONE HCL 10 MG PO TABS: 100.0000 mg | ORAL_TABLET | Freq: Every day | ORAL | Status: DC

## 2012-09-16 MED ORDER — METHADONE HCL 10 MG PO TABS
75.0000 mg | ORAL_TABLET | Freq: Every day | ORAL | Status: DC
  Administered 2012-09-16: 75 mg via ORAL
  Filled 2012-09-16: qty 8

## 2012-09-16 MED ORDER — METHADONE HCL 10 MG PO TABS: 75.0000 mg | ORAL_TABLET | Freq: Every day | ORAL | Status: DC

## 2012-09-16 MED ORDER — INSULIN GLARGINE 100 UNIT/ML ~~LOC~~ SOLN: 18.0000 [IU] | Freq: Two times a day (BID) | SUBCUTANEOUS | Status: DC

## 2012-09-16 NOTE — Care Management Note (Signed)
    Page 1 of 1   09/16/2012     11:03:04 AM   CARE MANAGEMENT NOTE 09/16/2012  Patient:  Rose [name], Rose [name]   Account Number:  [id number]  Date Initiated:  09/16/2012  Documentation initiated by:  Letha Cape  Subjective/Objective Assessment:   dx acute encephalopathy, hypeglycemia  admit as observation- from home with children, pta indep.     Action/Plan:   Anticipated DC Date:  09/16/2012   Anticipated DC Plan:  HOME/SELF CARE      DC Planning Services  CM consult      Choice offered to / List presented to:             Status of service:  Completed, signed off Medicare Important Message given?   (If response is "NO", the following Medicare IM given date fields will be blank) Date Medicare IM given:   Date Additional Medicare IM given:    Discharge Disposition:  HOME/SELF CARE  Per UR Regulation:  Reviewed for med. necessity/level of care/duration of stay  If discussed at Long Length of Stay Meetings, dates discussed:    Comments:  09/16/12 11:01 Letha Cape RN, BSN [phone] patient lives with chilren, pta indep.  Patient wants to go home today, she is for dc.  No needs anticipated.

## 2012-09-16 NOTE — Discharge Summary (Signed)
PATIENT DETAILS Name: Rose [name] Age: 57 y.o. Sex: female Date of Birth: 18-Jun-1955 MRN: [id number]. Admit Date: 09/15/2012 Admitting Physician: Henderson Cloud, MD NWG:NFAOZH,YQMV, MD  Recommendations for Outpatient Follow-up:  1. Optimize insulin regimen 2. Emphasized importance of compliance to insulin and diabetes medications 3. Minimize narcotics and other sedative medication  PRIMARY DISCHARGE DIAGNOSIS:  Principal Problem:   Acute encephalopathy Active Problems:   ARF (acute renal failure)   DM (diabetes mellitus), secondary uncontrolled      PAST MEDICAL HISTORY: Past Medical History  Diagnosis Date  . Diabetes mellitus   . Hypertension   . Arthritis   . History of drug abuse states quit herion in 2006  . Chronic back pain   . Frequency of urination   . Urge urinary incontinence   . Nocturia   . Edema of lower extremity history cellulitis jan 2013  . Methadone maintenance therapy patient   . Anxiety   . Depression   . History of chronic bronchitis   . Asthma   . Tendonitis     DISCHARGE MEDICATIONS:   Medication List    TAKE these medications       albuterol 108 (90 BASE) MCG/ACT inhaler  Commonly known as:  PROVENTIL HFA;VENTOLIN HFA  Inhale 2 puffs into the lungs every 6 (six) hours as needed. For wheezing     atenolol 50 MG tablet  Commonly known as:  TENORMIN  Take 50 mg by mouth daily.     hydrochlorothiazide 25 MG tablet  Commonly known as:  HYDRODIURIL  Take 25 mg by mouth every morning.     insulin glargine 100 UNIT/ML injection  Commonly known as:  LANTUS  Inject 0.18 mLs (18 Units total) into the skin 2 (two) times daily.     meloxicam 15 MG tablet  Commonly known as:  MOBIC  Take 15 mg by mouth daily.     metFORMIN 1000 MG tablet  Commonly known as:  GLUCOPHAGE  Take 1,000 mg by mouth daily.     methadone 10 MG tablet  Commonly known as:  DOLOPHINE  Take 7.5 tablets (75 mg total) by mouth daily.      multivitamin with minerals Tabs  Take 1 tablet by mouth daily.     pregabalin 200 MG capsule  Commonly known as:  LYRICA  Take 200 mg by mouth 2 (two) times daily.        ALLERGIES:   Allergies  Allergen Reactions  . Gabapentin Hives and Other (See Comments)    Only when takes over 300 mg dose. "gives me the shakes"  . Vicodin (Hydrocodone-Acetaminophen) Diarrhea and Other (See Comments)    Stomach  cramps    BRIEF HPI:  See H&P, Labs, Consult and Test reports for all details in brief, patient is a 57 year old Caucasian female  a prior history of diabetes, hypertension, noncompliance to medication, chronic methadone use who was found by police sleeping in a car and was difficult to arouse. She was then brought to the ED, a CT of the head was negative for acute intracranial abnormalities, however she was found to have mild acute renal failure and elevated blood sugars. She was then admitted for further evaluation.  CONSULTATIONS:   None  PERTINENT RADIOLOGIC STUDIES: Ct Head Wo Contrast  09/15/2012   *RADIOLOGY REPORT*  Clinical Data: Altered mental status  CT HEAD WITHOUT CONTRAST  Technique:  Contiguous axial images were obtained from the base of the skull through the vertex without contrast.  Comparison: 08/20/2012  Findings: There is no mass effect, midline shift, or acute intracranial hemorrhage.  Mastoid air cells and visualized paranasal sinuses are clear.  Mild global atrophy.  IMPRESSION: No acute intracranial pathology.   Original Report Authenticated By: Jolaine Click, M.D.   Ct Head Wo Contrast  08/20/2012   *RADIOLOGY REPORT*  Clinical Data: Right hand numbness over the past 2 days.  Current history of hypertension, diabetes, chronic pain syndrome, and methadone therapy.  CT HEAD WITHOUT CONTRAST  Technique:  Contiguous axial images were obtained from the base of the skull through the vertex without contrast.  Comparison: None.  Findings: Ventricular system normal in size and  appearance for age. No mass lesion.  No midline shift.  No acute hemorrhage or hematoma.  No extra-axial fluid collections.  No evidence of acute infarction.  No skull fracture or other focal osseous abnormality involving the skull.  Visualized paranasal sinuses, bilateral mastoid air cells, and bilateral middle ear cavities well-aerated.  Mild bilateral carotid siphon atherosclerosis.  IMPRESSION:  1.  No acute intracranial abnormality. 2.  Mild bilateral carotid siphon atherosclerosis.   Original Report Authenticated By: Hulan Saas, M.D.     PERTINENT LAB RESULTS: CBC:  Recent Labs  09/15/12 2126 09/16/12 0554  WBC 9.9 7.5  HGB 12.8 11.6*  HCT 37.7 34.2*  PLT 149* 157   CMET CMP     Component Value Date/Time   NA 132* 09/16/2012 0554   K 4.0 09/16/2012 0554   CL 101 09/16/2012 0554   CO2 21 09/16/2012 0554   GLUCOSE 447* 09/16/2012 0554   BUN 29* 09/16/2012 0554   CREATININE 1.01 09/16/2012 0554   CALCIUM 8.5 09/16/2012 0554   PROT 7.7 09/15/2012 1348   ALBUMIN 3.7 09/15/2012 1348   AST 43* 09/15/2012 1348   ALT 27 09/15/2012 1348   ALKPHOS 116 09/15/2012 1348   BILITOT 0.5 09/15/2012 1348   GFRNONAA 61* 09/16/2012 0554   GFRAA 71* 09/16/2012 0554    GFR Estimated Creatinine Clearance: 58.7 ml/min (by C-G formula based on Cr of 1.01). No results found for this basename: LIPASE, AMYLASE,  in the last 72 hours No results found for this basename: CKTOTAL, CKMB, CKMBINDEX, TROPONINI,  in the last 72 hours No components found with this basename: POCBNP,  No results found for this basename: DDIMER,  in the last 72 hours  Recent Labs  09/15/12 2126  HGBA1C 10.2*   No results found for this basename: CHOL, HDL, LDLCALC, TRIG, CHOLHDL, LDLDIRECT,  in the last 72 hours No results found for this basename: TSH, T4TOTAL, FREET3, T3FREE, THYROIDAB,  in the last 72 hours No results found for this basename: VITAMINB12, FOLATE, FERRITIN, TIBC, IRON, RETICCTPCT,  in the last 72  hours Coags: No results found for this basename: PT, INR,  in the last 72 hours Microbiology: No results found for this or any previous visit (from the past 240 hour(s)).   BRIEF HOSPITAL COURSE:  Acute encephalopathy - Likely multifactorial from probable toxic metabolite encephalopathy-from combination of methadone, elevated blood sugars and Lyrica. - CT of the head was negative, she was admitted overnight for further observation, she is completely awake and alert this morning. Apparently her mental status resolved on admission as well. - I did speak with Dan[facility] meth[facility]one clinic where the patient goes to her daily methadone dose. She does take 108 mg of methadone daily, she will be given 75 mg of methadone today and discharged. She will followup at  the methadone clinic-[place] of her circumstances leading to her admission. Further dosing will be dependent on the attending physician at the clinic.  Uncontrolled diabetes - She is unfortunately noncompliant to insulin. Her sugars were in the 400s range. She was restarted on Lantus 10 units twice a day, however she does still continue to be uncontrolled. I have advised her to remain hospitalized till we are able to optimize her blood sugars, however she is insisting on being discharged today. She knows that our recommendations of her stay, however she is being discharged home at her own request. I have increased her Lantus to 18 units twice a day, I have emphasized the importance of compliance to insulin and long-term and short-term consequences of uncontrolled diabetes like DKA, nephropathy, retinopathy etc. She understands  these risks and is still wanting to be discharged today.  Mild acute renal failure - Likely prerenal, resolved with IV fluids  Prior Heroin Use  -Has been sober for 3 years.  -Attends a methadone clinic.  Hypertension - Controlled, continue with atenolol.   TODAY-DAY OF  DISCHARGE:  Subjective:   Rose [name] today has no headache,no chest abdominal pain,no new weakness tingling or numbness, feels much better wants to go home today.  She is being discharged at own request. Objective:   Blood pressure 121/63, pulse 61, temperature 98.2 F (36.8 C), temperature source Oral, resp. rate 18, height 5\' 2"  (1.575 m), weight 72.666 kg (160 lb 3.2 oz), last menstrual period 07/31/2011, SpO2 95.00%.  Intake/Output Summary (Last 24 hours) at 09/16/12 1020 Last data filed at 09/16/12 0800  Gross per 24 hour  Intake   2715 ml  Output      0 ml  Net   2715 ml   Filed Weights   09/15/12 2010  Weight: 72.666 kg (160 lb 3.2 oz)    Exam Awake Alert, Oriented *3, No new F.N deficits, Normal affect [place].AT,PERRAL Supple Neck,No JVD, No cervical lymphadenopathy appriciated.  Symmetrical Chest wall movement, Good air movement bilaterally, CTAB RRR,No Gallops,Rubs or new Murmurs, No Parasternal Heave +ve B.Sounds, Abd Soft, Non tender, No organomegaly appriciated, No rebound -guarding or rigidity. No Cyanosis, Clubbing or edema, No new Rash or bruise  DISCHARGE CONDITION: Stable  DISPOSITION: Home   DISCHARGE INSTRUCTIONS:    Activity:  As tolerated   Diet recommendation: Diabetic Diet Heart Healthy diet       Discharge Orders   Future Orders Complete By Expires     Call MD for:  difficulty breathing, headache or visual disturbances  As directed     Call MD for:  persistant nausea and vomiting  As directed     Call MD for:  severe uncontrolled pain  As directed     Diet - low sodium heart healthy  As directed     Increase activity slowly  As directed        Follow-up Information   Follow up with [facility], MD. Schedule an appointment as soon as possible for a visit in 1 week.   Contact information:   [address] [place] [address] [phone]         Total Time spent on discharge equals 45  minutes.  SignedJeoffrey Massed 09/16/2012 10:20 AM

## 2012-09-16 NOTE — Progress Notes (Signed)
Nsg Discharge Note  Admit Date:  09/15/2012 Discharge date: 09/16/2012   Chemere L Hiott to be D/C'd Home per MD order.  AVS completed.  Copy for chart, and copy for patient signed, and dated. Patient/caregiver able to verbalize understanding.  Discharge Medication:   Medication List    TAKE these medications       albuterol 108 (90 BASE) MCG/ACT inhaler  Commonly known as:  PROVENTIL HFA;VENTOLIN HFA  Inhale 2 puffs into the lungs every 6 (six) hours as needed. For wheezing     atenolol 50 MG tablet  Commonly known as:  TENORMIN  Take 50 mg by mouth daily.     hydrochlorothiazide 25 MG tablet  Commonly known as:  HYDRODIURIL  Take 25 mg by mouth every morning.     insulin glargine 100 UNIT/ML injection  Commonly known as:  LANTUS  Inject 0.18 mLs (18 Units total) into the skin 2 (two) times daily.     meloxicam 15 MG tablet  Commonly known as:  MOBIC  Take 15 mg by mouth daily.     metFORMIN 1000 MG tablet  Commonly known as:  GLUCOPHAGE  Take 1,000 mg by mouth daily.     methadone 10 MG tablet  Commonly known as:  DOLOPHINE  Take 7.5 tablets (75 mg total) by mouth daily.     multivitamin with minerals Tabs  Take 1 tablet by mouth daily.     pregabalin 200 MG capsule  Commonly known as:  LYRICA  Take 200 mg by mouth 2 (two) times daily.        Discharge Assessment: Filed Vitals:   09/16/12 0516  BP: 121/63  Pulse: 61  Temp: 98.2 F (36.8 C)  Resp: 18   Skin clean, dry and intact without evidence of skin break down, no evidence of skin tears noted. IV catheter discontinued intact. Site without signs and symptoms of complications - no redness or edema noted at insertion site, patient denies c/o pain - only slight tenderness at site.  Dressing with slight pressure applied.  D/c Instructions-Education: Discharge instructions given to patient/family with verbalized understanding. D/c education completed with patient/family including follow up instructions,  medication list, d/c activities limitations if indicated, with other d/c instructions as indicated by MD - patient able to verbalize understanding, all questions fully answered. Patient instructed to return to ED, call 911, or call MD for any changes in condition.  Patient escorted via WC, and D/C home via private auto.  Marina Desire, Gretta Cool, RN 09/16/2012 11:40 AM  Nsg Discharge Note  Admit Date:  09/15/2012 Discharge date: 09/16/2012   Clodagh L Frankum to be D/C'd Home per MD order.  AVS completed.  Copy for chart, and copy for patient signed, and dated. Patient/caregiver able to verbalize understanding.  Discharge Medication:   Medication List    TAKE these medications       albuterol 108 (90 BASE) MCG/ACT inhaler  Commonly known as:  PROVENTIL HFA;VENTOLIN HFA  Inhale 2 puffs into the lungs every 6 (six) hours as needed. For wheezing     atenolol 50 MG tablet  Commonly known as:  TENORMIN  Take 50 mg by mouth daily.     hydrochlorothiazide 25 MG tablet  Commonly known as:  HYDRODIURIL  Take 25 mg by mouth every morning.     insulin glargine 100 UNIT/ML injection  Commonly known as:  LANTUS  Inject 0.18 mLs (18 Units total) into the skin 2 (two) times daily.  meloxicam 15 MG tablet  Commonly known as:  MOBIC  Take 15 mg by mouth daily.     metFORMIN 1000 MG tablet  Commonly known as:  GLUCOPHAGE  Take 1,000 mg by mouth daily.     methadone 10 MG tablet  Commonly known as:  DOLOPHINE  Take 7.5 tablets (75 mg total) by mouth daily.     multivitamin with minerals Tabs  Take 1 tablet by mouth daily.     pregabalin 200 MG capsule  Commonly known as:  LYRICA  Take 200 mg by mouth 2 (two) times daily.        Discharge Assessment: Filed Vitals:   09/16/12 0516  BP: 121/63  Pulse: 61  Temp: 98.2 F (36.8 C)  Resp: 18   Skin clean with a skin tear on the LLE. IV catheter discontinued intact. Site without signs and symptoms of complications - no redness  or edema noted at insertion site, patient denies c/o pain - only slight tenderness at site.  Dressing with slight pressure applied.  D/c Instructions-Education: Discharge instructions given to patient/family with verbalized understanding. D/c education completed with patient/family including follow up instructions, medication list, d/c activities limitations if indicated, with other d/c instructions as indicated by MD - patient able to verbalize understanding, all questions fully answered. Patient instructed to return to ED, call 911, or call MD for any changes in condition.  Patient escorted via WC, and D/C home via private auto.  Libby Goehring Consuella Lose, RN 09/16/2012 11:40 AM

## 2012-10-15 ENCOUNTER — Other Ambulatory Visit (HOSPITAL_COMMUNITY): Payer: Self-pay | Admitting: Internal Medicine

## 2012-10-15 DIAGNOSIS — Z1231 Encounter for screening mammogram for malignant neoplasm of breast: Secondary | ICD-10-CM

## 2012-10-26 ENCOUNTER — Ambulatory Visit (HOSPITAL_COMMUNITY)
Admission: RE | Admit: 2012-10-26 | Discharge: 2012-10-26 | Disposition: A | Discharge status: Home / Self Care | Payer: PRIVATE HEALTH INSURANCE | Source: Ambulatory Visit | Attending: Internal Medicine | Admitting: Internal Medicine

## 2012-10-26 DIAGNOSIS — Z1231 Encounter for screening mammogram for malignant neoplasm of breast: Secondary | ICD-10-CM | POA: Insufficient documentation

## 2012-10-27 ENCOUNTER — Other Ambulatory Visit: Payer: Self-pay | Admitting: Internal Medicine

## 2012-10-27 DIAGNOSIS — R928 Other abnormal and inconclusive findings on diagnostic imaging of breast: Secondary | ICD-10-CM

## 2012-11-16 ENCOUNTER — Emergency Department (HOSPITAL_COMMUNITY)
Admission: EM | Admit: 2012-11-16 | Discharge: 2012-11-16 | Disposition: A | Discharge status: Home / Self Care | Payer: PRIVATE HEALTH INSURANCE | Attending: Emergency Medicine | Admitting: Emergency Medicine

## 2012-11-16 ENCOUNTER — Encounter (HOSPITAL_COMMUNITY): Payer: Self-pay | Admitting: *Deleted

## 2012-11-16 DIAGNOSIS — I10 Essential (primary) hypertension: Secondary | ICD-10-CM | POA: Insufficient documentation

## 2012-11-16 DIAGNOSIS — E119 Type 2 diabetes mellitus without complications: Secondary | ICD-10-CM | POA: Insufficient documentation

## 2012-11-16 DIAGNOSIS — Z87448 Personal history of other diseases of urinary system: Secondary | ICD-10-CM | POA: Insufficient documentation

## 2012-11-16 DIAGNOSIS — Z8659 Personal history of other mental and behavioral disorders: Secondary | ICD-10-CM | POA: Insufficient documentation

## 2012-11-16 DIAGNOSIS — J45909 Unspecified asthma, uncomplicated: Secondary | ICD-10-CM | POA: Insufficient documentation

## 2012-11-16 DIAGNOSIS — F411 Generalized anxiety disorder: Secondary | ICD-10-CM | POA: Insufficient documentation

## 2012-11-16 DIAGNOSIS — R609 Edema, unspecified: Secondary | ICD-10-CM | POA: Insufficient documentation

## 2012-11-16 DIAGNOSIS — Z794 Long term (current) use of insulin: Secondary | ICD-10-CM | POA: Insufficient documentation

## 2012-11-16 DIAGNOSIS — G8929 Other chronic pain: Secondary | ICD-10-CM | POA: Insufficient documentation

## 2012-11-16 DIAGNOSIS — F17200 Nicotine dependence, unspecified, uncomplicated: Secondary | ICD-10-CM | POA: Insufficient documentation

## 2012-11-16 DIAGNOSIS — Z791 Long term (current) use of non-steroidal anti-inflammatories (NSAID): Secondary | ICD-10-CM | POA: Insufficient documentation

## 2012-11-16 DIAGNOSIS — Z8709 Personal history of other diseases of the respiratory system: Secondary | ICD-10-CM | POA: Insufficient documentation

## 2012-11-16 DIAGNOSIS — Z79899 Other long term (current) drug therapy: Secondary | ICD-10-CM | POA: Insufficient documentation

## 2012-11-16 DIAGNOSIS — Z8739 Personal history of other diseases of the musculoskeletal system and connective tissue: Secondary | ICD-10-CM | POA: Insufficient documentation

## 2012-11-16 LAB — GLUCOSE, CAPILLARY: Glucose-Capillary: 91 mg/dL (ref 70–99)

## 2012-11-16 LAB — BASIC METABOLIC PANEL
BUN: 13 mg/dL (ref 6–23)
CO2: 31 mEq/L (ref 19–32)
Calcium: 9.7 mg/dL (ref 8.4–10.5)
Chloride: 101 mEq/L (ref 96–112)
Creatinine, Ser: 0.94 mg/dL (ref 0.50–1.10)
GFR calc Af Amer: 78 mL/min — ABNORMAL LOW (ref >90)
GFR calc non Af Amer: 67 mL/min — ABNORMAL LOW (ref >90)
Glucose, Bld: 65 mg/dL — ABNORMAL LOW (ref 70–99)
Potassium: 4.1 mEq/L (ref 3.5–5.1)
Sodium: 139 mEq/L (ref 135–145)

## 2012-11-16 MED ORDER — POTASSIUM CHLORIDE ER 10 MEQ PO TBCR: 10.0000 meq | EXTENDED_RELEASE_TABLET | Freq: Two times a day (BID) | ORAL | Status: DC

## 2012-11-16 MED ORDER — FUROSEMIDE 40 MG PO TABS: 40.0000 mg | ORAL_TABLET | Freq: Every day | ORAL | Status: DC

## 2012-11-16 MED ORDER — IBUPROFEN 800 MG PO TABS: 800.0000 mg | ORAL_TABLET | Freq: Three times a day (TID) | ORAL | Status: DC

## 2012-11-16 NOTE — ED Notes (Signed)
Patient was taken of Lasix and switched over to Atenolol. Patient has noticed increased swelling in both of her feet. Patient also noticed tingling in both hands 3-4 days ago. Equal grip strength. Patient denies SOB.

## 2012-11-16 NOTE — ED Provider Notes (Signed)
CSN: [id number]     Arrival date & time 11/16/12  1009 History     First MD Initiated Contact with Patient 11/16/12 1109     Chief Complaint  Patient presents with  . Leg Swelling   (Consider location/radiation/quality/duration/timing/severity/associated sxs/prior Treatment) The history is provided by the patient.   patient here complaining of bilateral lower extremity edema times one month that has been taken off her Lasix. Increased swelling to her hands as well. Denies any cough or short of breath. Denies any orthopnea. No anginal type chest pain. Has not restarted her Lasix. Patient is unsure why she's taken off her Lasix. No treatment used prior to Past Medical History  Diagnosis Date  . Diabetes mellitus   . Hypertension   . Arthritis   . History of drug abuse states quit herion in 2006  . Chronic back pain   . Frequency of urination   . Urge urinary incontinence   . Nocturia   . Edema of lower extremity history cellulitis jan 2013  . Methadone maintenance therapy patient   . Anxiety   . Depression   . History of chronic bronchitis   . Asthma   . Tendonitis    Past Surgical History  Procedure Laterality Date  . Appendectomy  1972  . Interstim implant placement  10/23/2011    Procedure: INTERSTIM IMPLANT FIRST STAGE;  Surgeon: Martina Sinner, MD;  Location: [facility];  Service: Urology;  Laterality: N/A;  . Interstim implant placement  10/23/2011    Procedure: INTERSTIM IMPLANT SECOND STAGE;  Surgeon: Martina Sinner, MD;  Location: [facility];  Service: Urology;  Laterality: N/A;   History reviewed. No pertinent family history. History  Substance Use Topics  . Smoking status: Current Every Day Smoker -- 1.00 packs/day for 20 years    Types: Cigarettes  . Smokeless tobacco: Never Used  . Alcohol Use: No   OB History   Grav Para Term Preterm Abortions TAB SAB Ect Mult Living                 Review of Systems  All other  systems reviewed and are negative.    Allergies  Gabapentin and Vicodin  Home Medications   Current Outpatient Rx  Name  Route  Sig  Dispense  Refill  . albuterol (PROVENTIL HFA;VENTOLIN HFA) 108 (90 BASE) MCG/ACT inhaler   Inhalation   Inhale 2 puffs into the lungs every 6 (six) hours as needed. For wheezing         . atenolol (TENORMIN) 50 MG tablet   Oral   Take 50 mg by mouth daily.         . cetirizine (ZYRTEC) 10 MG tablet   Oral   Take 10 mg by mouth daily.         . hydrochlorothiazide (HYDRODIURIL) 25 MG tablet   Oral   Take 25 mg by mouth every morning.          [name] ibuprofen (ADVIL,MOTRIN) 200 MG tablet   Oral   Take 600 mg by mouth every 6 (six) hours as needed for pain.         [name] insulin glargine (LANTUS) 100 UNIT/ML injection   Subcutaneous   Inject 20 Units into the skin 2 (two) times daily.         . meloxicam (MOBIC) 15 MG tablet   Oral   Take 15 mg by mouth daily.         [name]  metFORMIN (GLUCOPHAGE) 1000 MG tablet   Oral   Take 1,000 mg by mouth 2 (two) times daily with a meal.          . methadone (DOLOPHINE) 10 MG/5ML solution   Oral   Take 88 mg by mouth daily.         . Multiple Vitamin (MULTIVITAMIN WITH MINERALS) TABS tablet   Oral   Take 1 tablet by mouth daily.         . naproxen sodium (ANAPROX) 220 MG tablet   Oral   Take 440 mg by mouth 2 (two) times daily with a meal.         . pregabalin (LYRICA) 150 MG capsule   Oral   Take 150 mg by mouth 2 (two) times daily.          BP 148/94  Pulse 71  Temp(Src) 98.4 F (36.9 C) (Oral)  Resp 18  SpO2 98%  LMP 07/31/2011 Physical Exam  Nursing note and vitals reviewed. Constitutional: She is oriented to person, place, and time. She appears well-developed and well-nourished.  Non-toxic appearance. No distress.  HENT:  Head: Normocephalic and atraumatic.  Eyes: Conjunctivae, EOM and lids are normal. Pupils are equal, round, and reactive to light.  Neck: Normal  range of motion. Neck supple. No tracheal deviation present. No mass present.  Cardiovascular: Normal rate, regular rhythm and normal heart sounds.  Exam reveals no gallop.   No murmur heard. Pulmonary/Chest: Effort normal and breath sounds normal. No stridor. No respiratory distress. She has no decreased breath sounds. She has no wheezes. She has no rhonchi. She has no rales.  Abdominal: Soft. Normal appearance and bowel sounds are normal. She exhibits no distension. There is no tenderness. There is no rebound and no CVA tenderness.  Musculoskeletal: Normal range of motion. She exhibits no edema and no tenderness.  2+ bilateral lower extremity edema  Neurological: She is alert and oriented to person, place, and time. She has normal strength. No cranial nerve deficit or sensory deficit. GCS eye subscore is 4. GCS verbal subscore is 5. GCS motor subscore is 6.  Skin: Skin is warm and dry. No abrasion and no rash noted.  Psychiatric: She has a normal mood and affect. Her speech is normal and behavior is normal.    ED Course   Procedures (including critical care time)  Labs Reviewed  BASIC METABOLIC PANEL   No results found. No diagnosis found.  MDM  Patient will be started on Lasix and will see her Dr. for  Toy Baker, MD 11/16/12 1447

## 2012-11-16 NOTE — ED Notes (Signed)
I attempted x2 to draw blood, unsuccessful x2. Judeth Cornfield, phlebotomist notified

## 2012-11-16 NOTE — ED Notes (Signed)
Pt given orange juice r/t low blood sugar

## 2012-11-16 NOTE — ED Notes (Signed)
CBG 61- RN [employer] notified. Patient given Orange juice. Will recheck sugar.

## 2012-11-18 LAB — GLUCOSE, CAPILLARY: Glucose-Capillary: 61 mg/dL — ABNORMAL LOW (ref 70–99)

## 2013-02-08 ENCOUNTER — Encounter (HOSPITAL_COMMUNITY): Payer: Self-pay | Admitting: Emergency Medicine

## 2013-02-08 ENCOUNTER — Emergency Department (HOSPITAL_COMMUNITY)
Admission: EM | Admit: 2013-02-08 | Discharge: 2013-02-08 | Disposition: A | Discharge status: Home / Self Care | Payer: PRIVATE HEALTH INSURANCE | Attending: Emergency Medicine | Admitting: Emergency Medicine

## 2013-02-08 DIAGNOSIS — Z791 Long term (current) use of non-steroidal anti-inflammatories (NSAID): Secondary | ICD-10-CM | POA: Insufficient documentation

## 2013-02-08 DIAGNOSIS — Z79899 Other long term (current) drug therapy: Secondary | ICD-10-CM | POA: Insufficient documentation

## 2013-02-08 DIAGNOSIS — E119 Type 2 diabetes mellitus without complications: Secondary | ICD-10-CM | POA: Insufficient documentation

## 2013-02-08 DIAGNOSIS — R252 Cramp and spasm: Secondary | ICD-10-CM

## 2013-02-08 DIAGNOSIS — F329 Major depressive disorder, single episode, unspecified: Secondary | ICD-10-CM | POA: Insufficient documentation

## 2013-02-08 DIAGNOSIS — F3289 Other specified depressive episodes: Secondary | ICD-10-CM | POA: Insufficient documentation

## 2013-02-08 DIAGNOSIS — J45909 Unspecified asthma, uncomplicated: Secondary | ICD-10-CM | POA: Insufficient documentation

## 2013-02-08 DIAGNOSIS — F411 Generalized anxiety disorder: Secondary | ICD-10-CM | POA: Insufficient documentation

## 2013-02-08 DIAGNOSIS — T4275XA Adverse effect of unspecified antiepileptic and sedative-hypnotic drugs, initial encounter: Secondary | ICD-10-CM | POA: Insufficient documentation

## 2013-02-08 DIAGNOSIS — G8929 Other chronic pain: Secondary | ICD-10-CM | POA: Insufficient documentation

## 2013-02-08 DIAGNOSIS — I10 Essential (primary) hypertension: Secondary | ICD-10-CM | POA: Insufficient documentation

## 2013-02-08 DIAGNOSIS — Z794 Long term (current) use of insulin: Secondary | ICD-10-CM | POA: Insufficient documentation

## 2013-02-08 DIAGNOSIS — R259 Unspecified abnormal involuntary movements: Secondary | ICD-10-CM | POA: Insufficient documentation

## 2013-02-08 DIAGNOSIS — M129 Arthropathy, unspecified: Secondary | ICD-10-CM | POA: Insufficient documentation

## 2013-02-08 DIAGNOSIS — F17200 Nicotine dependence, unspecified, uncomplicated: Secondary | ICD-10-CM | POA: Insufficient documentation

## 2013-02-08 MED ORDER — LORAZEPAM 1 MG PO TABS: 1.0000 mg | ORAL_TABLET | Freq: Three times a day (TID) | ORAL | Status: DC | PRN

## 2013-02-08 MED ORDER — LORAZEPAM 1 MG PO TABS
1.0000 mg | ORAL_TABLET | Freq: Once | ORAL | Status: AC
  Administered 2013-02-08: 1 mg via ORAL
  Filled 2013-02-08: qty 1

## 2013-02-08 NOTE — ED Provider Notes (Signed)
Medical screening examination/treatment/procedure(s) were performed by non-physician practitioner and as supervising physician I was immediately available for consultation/collaboration.  Flint Melter, MD 02/08/13 [phone]

## 2013-02-08 NOTE — ED Notes (Signed)
Patient reports that she is having frequent jerking movements after taking an antibiotic with her regular medications. Patient states this happened once before when she took Lyrica.

## 2013-02-08 NOTE — ED Provider Notes (Signed)
CSN: [id number]     Arrival date & time 02/08/13  1252 History   First MD Initiated Contact with Patient 02/08/13 1421     Chief Complaint  Patient presents with  . Medication Reaction    jerking movements   (Consider location/radiation/quality/duration/timing/severity/associated sxs/prior Treatment) HPI Comments: Patient is a 57 year old female with a past medical history of diabetes, hypertension who presents with jerking movements that started last night. Symptoms started gradually and progressively worsened since the onset. Patient reports intermittent episodes of the jerking movement. Patient thought it was due to antibiotics she was taking but she did not take them today and continues to have symptoms. Patient reports this same thing happened when she started taking Lyrica a few years ago. Patient reports her doctor recently increased her lyrica dose last week. Patient thinks the jerking movements may be due to the increased dose.    Past Medical History  Diagnosis Date  . Diabetes mellitus   . Hypertension   . Arthritis   . History of drug abuse states quit herion in 2006  . Chronic back pain   . Frequency of urination   . Urge urinary incontinence   . Nocturia   . Edema of lower extremity history cellulitis jan 2013  . Methadone maintenance therapy patient   . Anxiety   . Depression   . History of chronic bronchitis   . Asthma   . Tendonitis    Past Surgical History  Procedure Laterality Date  . Appendectomy  1972  . Interstim implant placement  10/23/2011    Procedure: INTERSTIM IMPLANT FIRST STAGE;  Surgeon: Martina Sinner, MD;  Location: [facility];  Service: Urology;  Laterality: N/A;  . Interstim implant placement  10/23/2011    Procedure: INTERSTIM IMPLANT SECOND STAGE;  Surgeon: Martina Sinner, MD;  Location: [facility];  Service: Urology;  Laterality: N/A;   History reviewed. No pertinent family history. History  Substance  Use Topics  . Smoking status: Current Every Day Smoker -- 1.00 packs/day for 20 years    Types: Cigarettes  . Smokeless tobacco: Never Used  . Alcohol Use: No   OB History   Grav Para Term Preterm Abortions TAB SAB Ect Mult Living                 Review of Systems  Neurological: Positive for tremors.  All other systems reviewed and are negative.    Allergies  Gabapentin and Vicodin  Home Medications   Current Outpatient Rx  Name  Route  Sig  Dispense  Refill  . albuterol (PROVENTIL HFA;VENTOLIN HFA) 108 (90 BASE) MCG/ACT inhaler   Inhalation   Inhale 2 puffs into the lungs every 6 (six) hours as needed. For wheezing         . atenolol (TENORMIN) 50 MG tablet   Oral   Take 50 mg by mouth daily.         . cetirizine (ZYRTEC) 10 MG tablet   Oral   Take 10 mg by mouth daily.         . furosemide (LASIX) 40 MG tablet   Oral   Take 1 tablet (40 mg total) by mouth daily.   30 tablet   0   . hydrochlorothiazide (HYDRODIURIL) 25 MG tablet   Oral   Take 25 mg by mouth every morning.          [name] ibuprofen (ADVIL,MOTRIN) 600 MG tablet   Oral  Take 600 mg by mouth every 6 (six) hours as needed.         . insulin glargine (LANTUS) 100 UNIT/ML injection   Subcutaneous   Inject 20 Units into the skin 2 (two) times daily.         . meloxicam (MOBIC) 15 MG tablet   Oral   Take 15 mg by mouth daily.         . metFORMIN (GLUCOPHAGE) 1000 MG tablet   Oral   Take 1,000 mg by mouth 2 (two) times daily with a meal.          . methadone (DOLOPHINE) 10 MG/5ML solution   Oral   Take 88 mg by mouth daily.         . Multiple Vitamin (MULTIVITAMIN WITH MINERALS) TABS tablet   Oral   Take 1 tablet by mouth daily.         . multivitamin-iron-minerals-folic acid (CENTRUM) chewable tablet   Oral   Chew 1 tablet by mouth daily.         . potassium chloride (K-DUR) 10 MEQ tablet   Oral   Take 1 tablet (10 mEq total) by mouth 2 (two) times daily.   30  tablet   0   . pregabalin (LYRICA) 200 MG capsule   Oral   Take 200 mg by mouth 2 (two) times daily.         [name] sulfamethoxazole-trimethoprim (BACTRIM DS) 800-160 MG per tablet   Oral   Take 1 tablet by mouth 2 (two) times daily.          BP 129/86  Pulse 83  Temp(Src) 98 F (36.7 C) (Oral)  Resp 18  SpO2 96%  LMP 07/31/2011 Physical Exam  Nursing note and vitals reviewed. Constitutional: She is oriented to person, place, and time. She appears well-developed and well-nourished. No distress.  No jerking movements noted during interview and exam.   HENT:  Head: Normocephalic and atraumatic.  Eyes: Conjunctivae are normal.  Neck: Normal range of motion.  Cardiovascular: Normal rate and regular rhythm.  Exam reveals no gallop and no friction rub.   No murmur heard. Pulmonary/Chest: Effort normal and breath sounds normal. She has no wheezes. She has no rales. She exhibits no tenderness.  Abdominal: Soft. There is no tenderness.  Musculoskeletal: Normal range of motion.  Neurological: She is alert and oriented to person, place, and time. Coordination normal.  Speech is goal-oriented. Moves limbs without ataxia.   Skin: Skin is warm and dry.  Psychiatric: She has a normal mood and affect. Her behavior is normal.    ED Course  Procedures (including critical care time) Labs Review Labs Reviewed - No data to display Imaging Review No results found.  EKG Interpretation   None       MDM   1. Jerking movements of extremities     2:34 PM Patient will have 0.5mg  ativan here. Patient instructed to follow up with her PCP to have her Lyrica prescription changed. Patient instructed to return with worsening or concerning symptoms.     Emilia Beck, PA-C 02/08/13 1444

## 2013-02-21 ENCOUNTER — Ambulatory Visit
Admission: RE | Admit: 2013-02-21 | Discharge: 2013-02-21 | Disposition: A | Discharge status: Home / Self Care | Payer: PRIVATE HEALTH INSURANCE | Source: Ambulatory Visit | Attending: Internal Medicine | Admitting: Internal Medicine

## 2013-02-21 ENCOUNTER — Other Ambulatory Visit: Payer: Self-pay | Admitting: Internal Medicine

## 2013-02-21 DIAGNOSIS — R928 Other abnormal and inconclusive findings on diagnostic imaging of breast: Secondary | ICD-10-CM

## 2013-02-28 ENCOUNTER — Ambulatory Visit
Admission: RE | Admit: 2013-02-28 | Discharge: 2013-02-28 | Disposition: A | Discharge status: Home / Self Care | Payer: PRIVATE HEALTH INSURANCE | Source: Ambulatory Visit | Attending: Internal Medicine | Admitting: Internal Medicine

## 2013-02-28 DIAGNOSIS — R928 Other abnormal and inconclusive findings on diagnostic imaging of breast: Secondary | ICD-10-CM

## 2013-08-26 DIAGNOSIS — J45909 Unspecified asthma, uncomplicated: Secondary | ICD-10-CM | POA: Insufficient documentation

## 2013-08-26 DIAGNOSIS — M1990 Unspecified osteoarthritis, unspecified site: Secondary | ICD-10-CM | POA: Insufficient documentation

## 2013-08-26 DIAGNOSIS — B1920 Unspecified viral hepatitis C without hepatic coma: Secondary | ICD-10-CM | POA: Insufficient documentation

## 2013-10-03 ENCOUNTER — Other Ambulatory Visit: Payer: Self-pay

## 2013-10-03 DIAGNOSIS — Z1231 Encounter for screening mammogram for malignant neoplasm of breast: Secondary | ICD-10-CM

## 2013-10-07 ENCOUNTER — Ambulatory Visit
Admission: RE | Admit: 2013-10-07 | Discharge: 2013-10-07 | Disposition: A | Discharge status: Home / Self Care | Payer: Medicare Other | Source: Ambulatory Visit

## 2013-10-07 ENCOUNTER — Encounter (INDEPENDENT_AMBULATORY_CARE_PROVIDER_SITE_OTHER): Payer: Self-pay

## 2013-10-07 DIAGNOSIS — Z1231 Encounter for screening mammogram for malignant neoplasm of breast: Secondary | ICD-10-CM

## 2013-10-11 ENCOUNTER — Other Ambulatory Visit: Payer: Self-pay | Admitting: Internal Medicine

## 2013-10-11 DIAGNOSIS — R928 Other abnormal and inconclusive findings on diagnostic imaging of breast: Secondary | ICD-10-CM

## 2013-12-04 ENCOUNTER — Encounter (HOSPITAL_COMMUNITY): Payer: Self-pay | Admitting: Emergency Medicine

## 2013-12-04 ENCOUNTER — Emergency Department (HOSPITAL_COMMUNITY)
Admission: EM | Admit: 2013-12-04 | Discharge: 2013-12-04 | Disposition: A | Discharge status: Home / Self Care | Payer: Medicare Other | Attending: Emergency Medicine | Admitting: Emergency Medicine

## 2013-12-04 DIAGNOSIS — J45909 Unspecified asthma, uncomplicated: Secondary | ICD-10-CM | POA: Diagnosis not present

## 2013-12-04 DIAGNOSIS — Z794 Long term (current) use of insulin: Secondary | ICD-10-CM | POA: Diagnosis not present

## 2013-12-04 DIAGNOSIS — Z79899 Other long term (current) drug therapy: Secondary | ICD-10-CM | POA: Insufficient documentation

## 2013-12-04 DIAGNOSIS — F329 Major depressive disorder, single episode, unspecified: Secondary | ICD-10-CM | POA: Insufficient documentation

## 2013-12-04 DIAGNOSIS — F411 Generalized anxiety disorder: Secondary | ICD-10-CM | POA: Insufficient documentation

## 2013-12-04 DIAGNOSIS — Z791 Long term (current) use of non-steroidal anti-inflammatories (NSAID): Secondary | ICD-10-CM | POA: Diagnosis not present

## 2013-12-04 DIAGNOSIS — E119 Type 2 diabetes mellitus without complications: Secondary | ICD-10-CM | POA: Diagnosis not present

## 2013-12-04 DIAGNOSIS — M129 Arthropathy, unspecified: Secondary | ICD-10-CM | POA: Diagnosis not present

## 2013-12-04 DIAGNOSIS — M62838 Other muscle spasm: Secondary | ICD-10-CM | POA: Diagnosis present

## 2013-12-04 DIAGNOSIS — F3289 Other specified depressive episodes: Secondary | ICD-10-CM | POA: Insufficient documentation

## 2013-12-04 DIAGNOSIS — G8929 Other chronic pain: Secondary | ICD-10-CM | POA: Insufficient documentation

## 2013-12-04 DIAGNOSIS — IMO0001 Reserved for inherently not codable concepts without codable children: Secondary | ICD-10-CM | POA: Insufficient documentation

## 2013-12-04 DIAGNOSIS — I10 Essential (primary) hypertension: Secondary | ICD-10-CM | POA: Insufficient documentation

## 2013-12-04 DIAGNOSIS — F17200 Nicotine dependence, unspecified, uncomplicated: Secondary | ICD-10-CM | POA: Insufficient documentation

## 2013-12-04 DIAGNOSIS — R259 Unspecified abnormal involuntary movements: Secondary | ICD-10-CM | POA: Diagnosis not present

## 2013-12-04 DIAGNOSIS — R251 Tremor, unspecified: Secondary | ICD-10-CM

## 2013-12-04 MED ORDER — LORAZEPAM 1 MG PO TABS
1.0000 mg | ORAL_TABLET | Freq: Once | ORAL | Status: AC
  Administered 2013-12-04: 1 mg via ORAL
  Filled 2013-12-04: qty 1

## 2013-12-04 NOTE — Discharge Instructions (Signed)
Your tremor may be related to taking Lyrica more than recommended.  Please follow up with your doctor for further care.  Do not take Lyrica more than recommended.   Dystonias The dystonias are movement disorders in which sustained muscle contractions cause twisting and repetitive movements or abnormal postures. The movements, which are involuntary and sometimes painful, may affect a single muscle; a group of muscles such as those in the arms, legs, or neck; or the entire body. Early symptoms (problems) may include a deterioration in handwriting after writing several lines, foot cramps, and a tendency of one foot to pull up or drag after running or walking some distance. Other possible symptoms are tremor and voice or speech difficulties. Birth injury (particularly due to lack of oxygen), certain infections, reactions to certain drugs, heavy-metal or carbon monoxide poisoning, trauma (damage caused by an accident), or stroke can cause dystonic symptoms. About half the cases of dystonia have no connection to disease or injury and are called primary or idiopathic dystonia. Of the primary dystonias, many cases appear to be inherited in a dominant manner. Dystonias can also be symptoms of other diseases, some of which may be hereditary (passed down from parents). In some individuals, symptoms of a dystonia appear spontaneously in childhood between the ages of 16 and 5, usually in the foot or in the hand. For other individuals, the symptoms emerge in late adolescence or early adulthood. TREATMENT  No one treatment has been found universally effective for dystonia. Instead, physicians use a variety of therapies (medications, surgery and other treatments such as physical therapy, splinting, stress management, and biofeedback), aimed at reducing or eliminating muscle spasms and pain. Since response to drugs varies among patients and even in the same person over time, the therapy must be individualized. PROGNOSIS The  initial symptoms can be very mild and may be noticeable only after prolonged exertion, stress, or fatigue. Over a period of time, the symptoms may become more noticeable and widespread and be unrelenting; sometimes, however, there is little or no progression. RESEARCH BEING DONE Investigators believe that the dystonias result from an abnormality in an area of the brain called the basal ganglia, where some of the messages that initiate muscle contractions are processed. Scientists suspect a defect in the body's ability to process a group of chemicals called neurotransmitters that help cells in the brain communicate with each other. Scientists at the [facility] have conducted detailed investigations of the pattern of muscle activity in persons with dystonias. Studies using EEG analysis and neuroimaging are probing brain activity. The search for the gene or genes responsible for some forms of dominantly inherited dystonias continues. In 1989, a team of researchers mapped a gene for early-onset torsion dystonia to chromosome 9; the gene was subsequently named DYT1. In 1997, the team sequenced the DYT1 gene and found that it codes for a previously unknown protein now called "torsin A." Document Released: 03/07/2002 Document Revised: 06/09/2011 Document Reviewed: 05/11/2013 [facility] Patient Information 2015 [place], [place]. This information is not intended to replace advice given to you by your health care provider. Make sure you discuss any questions you have with your health care provider.

## 2013-12-04 NOTE — ED Notes (Signed)
Patient states she didn't take her Methadone yest or today.

## 2013-12-04 NOTE — ED Notes (Signed)
Went into give patient her medication she was sleeping on the stretcher no jerking movements noted and soon as I woke her she started jerking her ext.

## 2013-12-04 NOTE — ED Provider Notes (Signed)
Medical screening examination/treatment/procedure(s) were performed by non-physician practitioner and as supervising physician I was immediately available for consultation/collaboration.   EKG Interpretation None       Orlie Dakin, MD 12/04/13 [id number]

## 2013-12-04 NOTE — ED Notes (Signed)
States she started havin g right hand pain yest took lyric yest am and yest afternoon her left hand started hurting so she took another lyric states she started jerking all over  This has happened in the past if she took the lyric to close together however it would stop after a short period. States this time it wouldn't stop .

## 2013-12-04 NOTE — ED Provider Notes (Signed)
CSN: [id number]     Arrival date & time 12/04/13  0930 History   First MD Initiated Contact with Patient 12/04/13 1000     Chief Complaint  Patient presents with  . Spasms     (Consider location/radiation/quality/duration/timing/severity/associated sxs/prior Treatment) HPI  58 year old female with history of chronic back pain, fibromyalgias, history of drug abuse, currently on methadone treatment who presents for evaluation of spasms of upper extremities. Patient reports bilateral arms and hand pain yesterday that felt similar to her fibromyalgia. She recently was prescribed Lyrica which she has been without for the past several days. She normally takes Lyrica to help with  her fibromyalgia pain, and therefore she to back-to-back Lyrica last night. Approximately 15-20 minutes after taking her second dose of Lyrica patient developed uncontrollable tremor throughout her upper extremities. The tremor has been persistent, she was having difficulty sleeping last night and the symptoms persisted throughout today. Patient reports similar episode of body jerking secondary to taking too many Lyrica in the past. She has not taking any Lyrica today. She has no other complaint. Denies any chest pain or shortness of breath.    Past Medical History  Diagnosis Date  . Diabetes mellitus   . Hypertension   . Arthritis   . History of drug abuse states quit herion in 2006  . Chronic back pain   . Frequency of urination   . Urge urinary incontinence   . Nocturia   . Edema of lower extremity history cellulitis jan 2013  . Methadone maintenance therapy patient   . Anxiety   . Depression   . History of chronic bronchitis   . Asthma   . Tendonitis    Past Surgical History  Procedure Laterality Date  . Appendectomy  1972  . Interstim implant placement  10/23/2011    Procedure: INTERSTIM IMPLANT FIRST STAGE;  Surgeon: Reece Packer, MD;  Location: [facility];  Service: Urology;   Laterality: N/A;  . Interstim implant placement  10/23/2011    Procedure: INTERSTIM IMPLANT SECOND STAGE;  Surgeon: Reece Packer, MD;  Location: [facility];  Service: Urology;  Laterality: N/A;   History reviewed. No pertinent family history. History  Substance Use Topics  . Smoking status: Current Every Day Smoker -- 1.00 packs/day for 20 years    Types: Cigarettes  . Smokeless tobacco: Never Used  . Alcohol Use: No   OB History   Grav Para Term Preterm Abortions TAB SAB Ect Mult Living                 Review of Systems  All other systems reviewed and are negative.     Allergies  Gabapentin and Vicodin  Home Medications   Prior to Admission medications   Medication Sig Start Date End Date Taking? Authorizing Provider  albuterol (PROVENTIL HFA;VENTOLIN HFA) 108 (90 BASE) MCG/ACT inhaler Inhale 2 puffs into the lungs every 6 (six) hours as needed. For wheezing    Historical Provider, MD  atenolol (TENORMIN) 50 MG tablet Take 50 mg by mouth daily.    Historical Provider, MD  cetirizine (ZYRTEC) 10 MG tablet Take 10 mg by mouth daily.    Historical Provider, MD  furosemide (LASIX) 40 MG tablet Take 1 tablet (40 mg total) by mouth daily. 11/16/12   Leota Jacobsen, MD  hydrochlorothiazide (HYDRODIURIL) 25 MG tablet Take 25 mg by mouth every morning.     Historical Provider, MD  ibuprofen (ADVIL,MOTRIN) 600 MG tablet Take 600  mg by mouth every 6 (six) hours as needed.    Historical Provider, MD  insulin glargine (LANTUS) 100 UNIT/ML injection Inject 20 Units into the skin 2 (two) times daily.    Historical Provider, MD  LORazepam (ATIVAN) 1 MG tablet Take 1 tablet (1 mg total) by mouth 3 (three) times daily as needed for anxiety (you may take 0.5mg  as needed). 02/08/13   Kaitlyn Szekalski, PA-C  meloxicam (MOBIC) 15 MG tablet Take 15 mg by mouth daily.    Historical Provider, MD  metFORMIN (GLUCOPHAGE) 1000 MG tablet Take 1,000 mg by mouth 2 (two) times daily  with a meal.     Historical Provider, MD  methadone (DOLOPHINE) 10 MG/5ML solution Take 88 mg by mouth daily.    Historical Provider, MD  Multiple Vitamin (MULTIVITAMIN WITH MINERALS) TABS tablet Take 1 tablet by mouth daily.    Historical Provider, MD  multivitamin-iron-minerals-folic acid (CENTRUM) chewable tablet Chew 1 tablet by mouth daily.    Historical Provider, MD  potassium chloride (K-DUR) 10 MEQ tablet Take 1 tablet (10 mEq total) by mouth 2 (two) times daily. 11/16/12   Leota Jacobsen, MD  pregabalin (LYRICA) 200 MG capsule Take 200 mg by mouth 2 (two) times daily.    Historical Provider, MD   BP 100/70  Pulse 71  Temp(Src) 98.3 F (36.8 C) (Oral)  Resp 20  Ht 5' 2.5" (1.588 m)  Wt 165 lb (74.844 kg)  BMI 29.68 kg/m2  SpO2 100%  LMP 07/31/2011 Physical Exam  Nursing note and vitals reviewed. Constitutional: She is oriented to person, place, and time. She appears well-developed and well-nourished. No distress.  HENT:  Head: Atraumatic.  Eyes: Conjunctivae are normal.  Neck: Neck supple.  Cardiovascular: Intact distal pulses.   Neurological: She is alert and oriented to person, place, and time.  Patient with jerking motion throughout the upper extremities and chest however jerking stops with distraction. Normal grip strength bilaterally with intact radial pulse.  Skin: No rash noted.  Psychiatric: She has a normal mood and affect.    ED Course  Procedures (including critical care time)  10:28 AM Patient here complaining of jerking movements throughout her upper extremities after taking more than prescribed dose of Lyrica last night. Upon entering the room patient was sleeping peacefully without any fascicular movement or spasm. However once patient was awoke and talking she begins to exhibits violent jerking motion to the upper body, which stopped when she is distracted. She has been seen in ED in the past for this. Symptom seems to alleviate with Ativan. We'll give  Ativan here and will continue to monitor.  11:34 AM Patient is resting in bed comfortably, sleeping, no evidence of muscle spasm or body tremors.    12:19 PM Patient continues to be symptom free, sleeping in the room, but arousable. At this time patient is stable for discharge. Recommend to take her Lyrica only as prescribed.  Labs Review Labs Reviewed - No data to display  Imaging Review No results found.   EKG Interpretation None      MDM   Final diagnoses:  Occasional tremors    BP 104/48  Pulse 64  Temp(Src) 98.3 F (36.8 C) (Oral)  Resp 20  Ht 5' 2.5" (1.588 m)  Wt 165 lb (74.844 kg)  BMI 29.68 kg/m2  SpO2 92%  LMP 07/31/2011     Domenic Moras, PA-C 12/04/13 1220

## 2013-12-04 NOTE — ED Notes (Signed)
Resting  On stretcher no jerking at this time.

## 2013-12-08 ENCOUNTER — Ambulatory Visit
Admission: RE | Admit: 2013-12-08 | Discharge: 2013-12-08 | Disposition: A | Discharge status: Home / Self Care | Payer: Medicare Other | Source: Ambulatory Visit | Attending: Internal Medicine | Admitting: Internal Medicine

## 2013-12-08 DIAGNOSIS — R928 Other abnormal and inconclusive findings on diagnostic imaging of breast: Secondary | ICD-10-CM

## 2013-12-21 ENCOUNTER — Encounter (HOSPITAL_COMMUNITY): Payer: Self-pay | Admitting: Emergency Medicine

## 2013-12-21 ENCOUNTER — Emergency Department (HOSPITAL_COMMUNITY)
Admission: EM | Admit: 2013-12-21 | Discharge: 2013-12-21 | Disposition: A | Discharge status: Home / Self Care | Payer: Medicare Other | Attending: Emergency Medicine | Admitting: Emergency Medicine

## 2013-12-21 DIAGNOSIS — Z8659 Personal history of other mental and behavioral disorders: Secondary | ICD-10-CM | POA: Diagnosis not present

## 2013-12-21 DIAGNOSIS — Z8739 Personal history of other diseases of the musculoskeletal system and connective tissue: Secondary | ICD-10-CM | POA: Insufficient documentation

## 2013-12-21 DIAGNOSIS — F17200 Nicotine dependence, unspecified, uncomplicated: Secondary | ICD-10-CM | POA: Insufficient documentation

## 2013-12-21 DIAGNOSIS — Z794 Long term (current) use of insulin: Secondary | ICD-10-CM | POA: Diagnosis not present

## 2013-12-21 DIAGNOSIS — Z79899 Other long term (current) drug therapy: Secondary | ICD-10-CM | POA: Insufficient documentation

## 2013-12-21 DIAGNOSIS — I10 Essential (primary) hypertension: Secondary | ICD-10-CM | POA: Diagnosis not present

## 2013-12-21 DIAGNOSIS — G8929 Other chronic pain: Secondary | ICD-10-CM | POA: Insufficient documentation

## 2013-12-21 DIAGNOSIS — Z791 Long term (current) use of non-steroidal anti-inflammatories (NSAID): Secondary | ICD-10-CM | POA: Diagnosis not present

## 2013-12-21 DIAGNOSIS — J45909 Unspecified asthma, uncomplicated: Secondary | ICD-10-CM | POA: Diagnosis not present

## 2013-12-21 DIAGNOSIS — R739 Hyperglycemia, unspecified: Secondary | ICD-10-CM

## 2013-12-21 DIAGNOSIS — E119 Type 2 diabetes mellitus without complications: Secondary | ICD-10-CM | POA: Insufficient documentation

## 2013-12-21 DIAGNOSIS — R7309 Other abnormal glucose: Secondary | ICD-10-CM | POA: Diagnosis present

## 2013-12-21 LAB — COMPREHENSIVE METABOLIC PANEL
ALT: 9 U/L (ref 0–35)
AST: 20 U/L (ref 0–37)
Albumin: 3.4 g/dL — ABNORMAL LOW (ref 3.5–5.2)
Alkaline Phosphatase: 76 U/L (ref 39–117)
Anion gap: 10 (ref 5–15)
BUN: 19 mg/dL (ref 6–23)
CO2: 31 mEq/L (ref 19–32)
Calcium: 9.1 mg/dL (ref 8.4–10.5)
Chloride: 100 mEq/L (ref 96–112)
Creatinine, Ser: 1.09 mg/dL (ref 0.50–1.10)
GFR calc Af Amer: 65 mL/min — ABNORMAL LOW (ref >90)
GFR calc non Af Amer: 56 mL/min — ABNORMAL LOW (ref >90)
Glucose, Bld: 136 mg/dL — ABNORMAL HIGH (ref 70–99)
Potassium: 4.2 mEq/L (ref 3.7–5.3)
Sodium: 141 mEq/L (ref 137–147)
Total Bilirubin: 0.6 mg/dL (ref 0.3–1.2)
Total Protein: 7.5 g/dL (ref 6.0–8.3)

## 2013-12-21 LAB — CBC
HCT: 38.4 % (ref 36.0–46.0)
Hemoglobin: 12.7 g/dL (ref 12.0–15.0)
MCH: 27.5 pg (ref 26.0–34.0)
MCHC: 33.1 g/dL (ref 30.0–36.0)
MCV: 83.1 fL (ref 78.0–100.0)
Platelets: 118 10*3/uL — ABNORMAL LOW (ref 150–400)
RBC: 4.62 MIL/uL (ref 3.87–5.11)
RDW: 12.5 % (ref 11.5–15.5)
WBC: 9.1 10*3/uL (ref 4.0–10.5)

## 2013-12-21 NOTE — Discharge Instructions (Signed)
Blood Glucose Monitoring °Monitoring your blood glucose (also know as blood sugar) helps you to manage your diabetes. It also helps you and your health care provider monitor your diabetes and determine how well your treatment plan is working. °WHY SHOULD YOU MONITOR YOUR BLOOD GLUCOSE? °· It can help you understand how food, exercise, and medicine affect your blood glucose. °· It allows you to know what your blood glucose is at any given moment. You can quickly tell if you are having low blood glucose (hypoglycemia) or high blood glucose (hyperglycemia). °· It can help you and your health care provider know how to adjust your medicines. °· It can help you understand how to manage an illness or adjust medicine for exercise. °WHEN SHOULD YOU TEST? °Your health care provider will help you decide how often you should check your blood glucose. This may depend on the type of diabetes you have, your diabetes control, or the types of medicines you are taking. Be sure to write down all of your blood glucose readings so that this information can be reviewed with your health care provider. See below for examples of testing times that your health care provider may suggest. °Type 1 Diabetes °· Test 4 times a day if you are in good control, using an insulin pump, or perform multiple daily injections. °· If your diabetes is not well controlled or if you are sick, you may need to monitor more often. °· It is a good idea to also monitor: °· Before and after exercise. °· Between meals and 2 hours after a meal. °· Occasionally between 2:00 a.m. and 3:00 a.m. °Type 2 Diabetes °· It can vary with each person, but generally, if you are on insulin, test 4 times a day. °· If you take medicines by mouth (orally), test 2 times a day. °· If you are on a controlled diet, test once a day. °· If your diabetes is not well controlled or if you are sick, you may need to monitor more often. °HOW TO MONITOR YOUR BLOOD GLUCOSE °Supplies  Needed °· Blood glucose meter. °· Test strips for your meter. Each meter has its own strips. You must use the strips that go with your own meter. °· A pricking needle (lancet). °· A device that holds the lancet (lancing device). °· A journal or log book to write down your results. °Procedure °· Wash your hands with soap and water. Alcohol is not preferred. °· Prick the side of your finger (not the tip) with the lancet. °· Gently milk the finger until a small drop of blood appears. °· Follow the instructions that come with your meter for inserting the test strip, applying blood to the strip, and using your blood glucose meter. °Other Areas to Get Blood for Testing °Some meters allow you to use other areas of your body (other than your finger) to test your blood. These areas are called alternative sites. The most common alternative sites are: °· The forearm. °· The thigh. °· The back area of the lower leg. °· The palm of the hand. °The blood flow in these areas is slower. Therefore, the blood glucose values you get may be delayed, and the numbers are different from what you would get from your fingers. Do not use alternative sites if you think you are having hypoglycemia. Your reading will not be accurate. Always use a finger if you are having hypoglycemia. Also, if you cannot feel your lows (hypoglycemia unawareness), always use your fingers for your   blood glucose checks. °ADDITIONAL TIPS FOR GLUCOSE MONITORING °· Do not reuse lancets. °· Always carry your supplies with you. °· All blood glucose meters have a 24-hour "hotline" number to call if you have questions or need help. °· Adjust (calibrate) your blood glucose meter with a control solution after finishing a few boxes of strips. °BLOOD GLUCOSE RECORD KEEPING °It is a good idea to keep a daily record or log of your blood glucose readings. Most glucose meters, if not all, keep your glucose records stored in the meter. Some meters come with the ability to download  your records to your home computer. Keeping a record of your blood glucose readings is especially helpful if you are wanting to look for patterns. Make notes to go along with the blood glucose readings because you might forget what happened at that exact time. Keeping good records helps you and your health care provider to work together to achieve good diabetes management.  °Document Released: 03/20/2003 Document Revised: 08/01/2013 Document Reviewed: 08/09/2012 °[employer]® Patient Information ©2015 [employer]. This information is not intended to replace advice given to you by your health care provider. Make sure you discuss any questions you have with your health care provider. °Hyperglycemia °Hyperglycemia occurs when the glucose (sugar) in your blood is too high. Hyperglycemia can happen for many reasons, but it most often happens to people who do not know they have diabetes or are not managing their diabetes properly.  °CAUSES  °Whether you have diabetes or not, there are other causes of hyperglycemia. Hyperglycemia can occur when you have diabetes, but it can also occur in other situations that you might not be as aware of, such as: °Diabetes °· If you have diabetes and are having problems controlling your blood glucose, hyperglycemia could occur because of some of the following reasons: °¨ Not following your meal plan. °¨ Not taking your diabetes medications or not taking it properly. °¨ Exercising less or doing less activity than you normally do. °¨ Being sick. °Pre-diabetes °· This cannot be ignored. Before people develop Type 2 diabetes, they almost always have "pre-diabetes." This is when your blood glucose levels are higher than normal, but not yet high enough to be diagnosed as diabetes. Research has shown that some long-term damage to the body, especially the heart and circulatory system, may already be occurring during pre-diabetes. If you take action to manage your blood glucose when you have  pre-diabetes, you may delay or prevent Type 2 diabetes from developing. °Stress °· If you have diabetes, you may be "diet" controlled or on oral medications or insulin to control your diabetes. However, you may find that your blood glucose is higher than usual in the hospital whether you have diabetes or not. This is often referred to as "stress hyperglycemia." Stress can elevate your blood glucose. This happens because of hormones put out by the body during times of stress. If stress has been the cause of your high blood glucose, it can be followed regularly by your caregiver. That way he/she can make sure your hyperglycemia does not continue to get worse or progress to diabetes. °Steroids °· Steroids are medications that act on the infection fighting system (immune system) to block inflammation or infection. One side effect can be a rise in blood glucose. Most people can produce enough extra insulin to allow for this rise, but for those who cannot, steroids make blood glucose levels go even higher. It is not unusual for steroid treatments to "uncover" diabetes that   is developing. It is not always possible to determine if the hyperglycemia will go away after the steroids are stopped. A special blood test called an A1c is sometimes done to determine if your blood glucose was elevated before the steroids were started. °SYMPTOMS °· Thirsty. °· Frequent urination. °· Dry mouth. °· Blurred vision. °· Tired or fatigue. °· Weakness. °· Sleepy. °· Tingling in feet or leg. °DIAGNOSIS  °Diagnosis is made by monitoring blood glucose in one or all of the following ways: °· A1c test. This is a chemical found in your blood. °· Fingerstick blood glucose monitoring. °· Laboratory results. °TREATMENT  °First, knowing the cause of the hyperglycemia is important before the hyperglycemia can be treated. Treatment may include, but is not be limited to: °· Education. °· Change or adjustment in medications. °· Change or adjustment in  meal plan. °· Treatment for an illness, infection, etc. °· More frequent blood glucose monitoring. °· Change in exercise plan. °· Decreasing or stopping steroids. °· Lifestyle changes. °HOME CARE INSTRUCTIONS  °· Test your blood glucose as directed. °· Exercise regularly. Your caregiver will give you instructions about exercise. Pre-diabetes or diabetes which comes on with stress is helped by exercising. °· Eat wholesome, balanced meals. Eat often and at regular, fixed times. Your caregiver or nutritionist will give you a meal plan to guide your sugar intake. °· Being at an ideal weight is important. If needed, losing as little as 10 to 15 pounds may help improve blood glucose levels. °SEEK MEDICAL CARE IF:  °· You have questions about medicine, activity, or diet. °· You continue to have symptoms (problems such as increased thirst, urination, or weight gain). °SEEK IMMEDIATE MEDICAL CARE IF:  °· You are vomiting or have diarrhea. °· Your breath smells fruity. °· You are breathing faster or slower. °· You are very sleepy or incoherent. °· You have numbness, tingling, or pain in your feet or hands. °· You have chest pain. °· Your symptoms get worse even though you have been following your caregiver's orders. °· If you have any other questions or concerns. °Document Released: 09/10/2000 Document Revised: 06/09/2011 Document Reviewed: 07/14/2011 °Exit[employer]® Patient Information ©2015 [employer]. This information is not intended to replace advice given to you by your health care provider. Make sure you discuss any questions you have with your health care provider. ° °
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
## 2013-12-21 NOTE — ED Notes (Signed)
MD at bedside. 

## 2013-12-21 NOTE — ED Notes (Signed)
Pt comfortable with discharge and follow up instructions. No prescriptions. 

## 2013-12-21 NOTE — ED Notes (Signed)
Pt reports having high cbg for over two months. Reports having mild n/v, having excessive thirst and frequent urination. Also having swelling to bilateral legs. Ambulatory on arrival with a cane.

## 2014-05-04 ENCOUNTER — Other Ambulatory Visit: Payer: Self-pay | Admitting: Internal Medicine

## 2014-05-04 DIAGNOSIS — R921 Mammographic calcification found on diagnostic imaging of breast: Secondary | ICD-10-CM

## 2014-05-16 ENCOUNTER — Ambulatory Visit
Admission: RE | Admit: 2014-05-16 | Discharge: 2014-05-16 | Disposition: A | Discharge status: Home / Self Care | Payer: Medicaid Other | Source: Ambulatory Visit | Attending: Internal Medicine | Admitting: Internal Medicine

## 2014-05-16 DIAGNOSIS — R921 Mammographic calcification found on diagnostic imaging of breast: Secondary | ICD-10-CM

## 2014-11-03 ENCOUNTER — Encounter (HOSPITAL_COMMUNITY): Payer: Self-pay

## 2014-11-03 ENCOUNTER — Inpatient Hospital Stay (HOSPITAL_COMMUNITY)
Admission: EM | Admit: 2014-11-03 | Discharge: 2014-11-05 | DRG: 639 | Disposition: A | Discharge status: Home / Self Care | Payer: Medicare Other | Attending: Internal Medicine | Admitting: Internal Medicine

## 2014-11-03 DIAGNOSIS — E11649 Type 2 diabetes mellitus with hypoglycemia without coma: Secondary | ICD-10-CM | POA: Diagnosis not present

## 2014-11-03 DIAGNOSIS — Z79899 Other long term (current) drug therapy: Secondary | ICD-10-CM

## 2014-11-03 DIAGNOSIS — Z833 Family history of diabetes mellitus: Secondary | ICD-10-CM

## 2014-11-03 DIAGNOSIS — M549 Dorsalgia, unspecified: Secondary | ICD-10-CM | POA: Diagnosis present

## 2014-11-03 DIAGNOSIS — T383X1A Poisoning by insulin and oral hypoglycemic [antidiabetic] drugs, accidental (unintentional), initial encounter: Secondary | ICD-10-CM | POA: Diagnosis present

## 2014-11-03 DIAGNOSIS — J45909 Unspecified asthma, uncomplicated: Secondary | ICD-10-CM | POA: Diagnosis present

## 2014-11-03 DIAGNOSIS — I10 Essential (primary) hypertension: Secondary | ICD-10-CM | POA: Diagnosis present

## 2014-11-03 DIAGNOSIS — Z8249 Family history of ischemic heart disease and other diseases of the circulatory system: Secondary | ICD-10-CM | POA: Diagnosis not present

## 2014-11-03 DIAGNOSIS — F17210 Nicotine dependence, cigarettes, uncomplicated: Secondary | ICD-10-CM | POA: Diagnosis present

## 2014-11-03 DIAGNOSIS — Z885 Allergy status to narcotic agent status: Secondary | ICD-10-CM | POA: Diagnosis not present

## 2014-11-03 DIAGNOSIS — F419 Anxiety disorder, unspecified: Secondary | ICD-10-CM | POA: Diagnosis present

## 2014-11-03 DIAGNOSIS — M1990 Unspecified osteoarthritis, unspecified site: Secondary | ICD-10-CM | POA: Diagnosis present

## 2014-11-03 DIAGNOSIS — J42 Unspecified chronic bronchitis: Secondary | ICD-10-CM | POA: Diagnosis present

## 2014-11-03 DIAGNOSIS — Z79891 Long term (current) use of opiate analgesic: Secondary | ICD-10-CM

## 2014-11-03 DIAGNOSIS — Z888 Allergy status to other drugs, medicaments and biological substances status: Secondary | ICD-10-CM

## 2014-11-03 DIAGNOSIS — G8929 Other chronic pain: Secondary | ICD-10-CM | POA: Diagnosis present

## 2014-11-03 DIAGNOSIS — E118 Type 2 diabetes mellitus with unspecified complications: Secondary | ICD-10-CM | POA: Diagnosis not present

## 2014-11-03 DIAGNOSIS — F329 Major depressive disorder, single episode, unspecified: Secondary | ICD-10-CM | POA: Diagnosis present

## 2014-11-03 DIAGNOSIS — E119 Type 2 diabetes mellitus without complications: Secondary | ICD-10-CM | POA: Diagnosis present

## 2014-11-03 DIAGNOSIS — N179 Acute kidney failure, unspecified: Secondary | ICD-10-CM | POA: Diagnosis present

## 2014-11-03 DIAGNOSIS — E162 Hypoglycemia, unspecified: Secondary | ICD-10-CM | POA: Diagnosis present

## 2014-11-03 DIAGNOSIS — Z794 Long term (current) use of insulin: Secondary | ICD-10-CM

## 2014-11-03 DIAGNOSIS — N289 Disorder of kidney and ureter, unspecified: Secondary | ICD-10-CM

## 2014-11-03 LAB — CBC WITH DIFFERENTIAL/PLATELET
Basophils Absolute: 0 10*3/uL (ref 0.0–0.1)
Basophils Relative: 0 % (ref 0–1)
Eosinophils Absolute: 0.1 10*3/uL (ref 0.0–0.7)
Eosinophils Relative: 1 % (ref 0–5)
HCT: 41 % (ref 36.0–46.0)
Hemoglobin: 13.2 g/dL (ref 12.0–15.0)
Lymphocytes Relative: 29 % (ref 12–46)
Lymphs Abs: 4 10*3/uL (ref 0.7–4.0)
MCH: 27.4 pg (ref 26.0–34.0)
MCHC: 32.2 g/dL (ref 30.0–36.0)
MCV: 85.2 fL (ref 78.0–100.0)
Monocytes Absolute: 1.2 10*3/uL — ABNORMAL HIGH (ref 0.1–1.0)
Monocytes Relative: 9 % (ref 3–12)
Neutro Abs: 8.4 10*3/uL — ABNORMAL HIGH (ref 1.7–7.7)
Neutrophils Relative %: 61 % (ref 43–77)
Platelets: 170 10*3/uL (ref 150–400)
RBC: 4.81 MIL/uL (ref 3.87–5.11)
RDW: 12.4 % (ref 11.5–15.5)
WBC: 13.7 10*3/uL — ABNORMAL HIGH (ref 4.0–10.5)

## 2014-11-03 LAB — BASIC METABOLIC PANEL
Anion gap: 8 (ref 5–15)
BUN: 18 mg/dL (ref 6–20)
CO2: 31 mmol/L (ref 22–32)
Calcium: 9.2 mg/dL (ref 8.9–10.3)
Chloride: 104 mmol/L (ref 101–111)
Creatinine, Ser: 1.24 mg/dL — ABNORMAL HIGH (ref 0.44–1.00)
GFR calc Af Amer: 55 mL/min — ABNORMAL LOW (ref >60)
GFR calc non Af Amer: 47 mL/min — ABNORMAL LOW (ref >60)
Glucose, Bld: 24 mg/dL — CL (ref 65–99)
Potassium: 3.9 mmol/L (ref 3.5–5.1)
Sodium: 143 mmol/L (ref 135–145)

## 2014-11-03 LAB — CBG MONITORING, ED
Glucose-Capillary: 46 mg/dL — ABNORMAL LOW (ref 65–99)
Glucose-Capillary: 34 mg/dL — CL (ref 65–99)
Glucose-Capillary: 75 mg/dL (ref 65–99)
Glucose-Capillary: 83 mg/dL (ref 65–99)

## 2014-11-03 MED ORDER — SODIUM CHLORIDE 4 MEQ/ML IV SOLN
INTRAVENOUS | Status: DC
  Administered 2014-11-03: 20:00:00 via INTRAVENOUS
  Filled 2014-11-03 (×2): qty 1000

## 2014-11-03 MED ORDER — DEXTROSE 50 % IV SOLN
50.0000 mL | Freq: Once | INTRAVENOUS | Status: AC
  Administered 2014-11-03: 50 mL via INTRAVENOUS
  Filled 2014-11-03: qty 50

## 2014-11-03 MED ORDER — DEXTROSE 50 % IV SOLN
INTRAVENOUS | Status: AC
  Administered 2014-11-03: 20:00:00
  Filled 2014-11-03: qty 50

## 2014-11-03 MED ORDER — DEXTROSE 50 % IV SOLN
50.0000 mL | Freq: Once | INTRAVENOUS | Status: AC
  Administered 2014-11-03: 50 mL via INTRAVENOUS

## 2014-11-03 NOTE — ED Notes (Signed)
CBG 34. PT ALERT AND ACTIVE WITH CARE. EDP GLICK AWARE OF PT CURRENT STATUS AND ORDERS GIVEN. AWARE OF MEAL GIVEN. ANNA RN, MARTHA RN, Marshall RN AT BS TO ASSIST WITH CARE. PT GIVEN 2ND AMP OF D50. PT CONTINUES TO RECOVER. BLOOD COLLECTED WITH IV ACCESS. EDP GLICK MADE AWARE OF PT STATUS AFTER AMP D50 GIVEN

## 2014-11-03 NOTE — ED Notes (Signed)
RN notified about CBG result

## 2014-11-03 NOTE — ED Notes (Signed)
Glucose results are those prior to two initial amps of D50, results delayed due to orders not crossing over

## 2014-11-03 NOTE — ED Notes (Signed)
Pt continues to rest,  Not symptomatic or showing signs at this time for hypoglycemia,  Only witness to be very tired from previous extremely low cbg's.  Pt has had orange juice and 1/4 of sandwich that was right at 7 pm shift change

## 2014-11-03 NOTE — ED Notes (Signed)
Rose [name]  [phone]

## 2014-11-03 NOTE — ED Notes (Signed)
Per [facility]- Upon arrival pt in chair decreased LOC, diaphoretic CBG 32. 1 amp D50. Immediate recovery.  Family states pt not eating well and not taking medications well. Family states has had several episodes like this

## 2014-11-03 NOTE — ED Notes (Signed)
MD at bedside. 

## 2014-11-03 NOTE — ED Notes (Signed)
hospitalist at bedside

## 2014-11-03 NOTE — ED Notes (Signed)
Floor to call  When ready to receive patient

## 2014-11-03 NOTE — H&P (Signed)
PCP:  Antonietta Jewel, MD    Referring provider Roxanne Mins   Chief Complaint:  hypoglycemia HPI: Rose [name] is a 59 y.o. female   has a past medical history of Diabetes mellitus; Hypertension; Arthritis; History of drug abuse (states quit herion in 2006); Chronic back pain; Frequency of urination; Urge urinary incontinence; Nocturia; Edema of lower extremity (history cellulitis jan 2013); Methadone maintenance therapy patient; Anxiety; Depression; History of chronic bronchitis; Asthma; and Tendonitis.   Presented with persistent hypoglycemia patient Thinks she took Humalog 3 times yesterday because she was getting confused. She have had decreased PO intake because its been hot. She has been running low all day yesterday down to 60's . Patient lives in independent living. She has already administered herself 2 doses of Humalog 75/25 by 11 AM today not sure if took a third as she became progressively confused. Presented to ER and had to start D10 gtt to maintain BG within range.  Latest BG 83  Hospitalist was called for admission for persistent hypoglycemia due to unintentional Insulin overdose  Review of Systems:    Pertinent positives include:  confusion   Constitutional:  No weight loss, night sweats, Fevers, chills, fatigue, weight loss  HEENT:  No headaches, Difficulty swallowing,Tooth/dental problems,Sore throat,  No sneezing, itching, ear ache, nasal congestion, post nasal drip,  Cardio-vascular:  No chest pain, Orthopnea, PND, anasarca, dizziness, palpitations.no Bilateral lower extremity swelling  GI:  No heartburn, indigestion, abdominal pain, nausea, vomiting, diarrhea, change in bowel habits, loss of appetite, melena, blood in stool, hematemesis Resp:  no shortness of breath at rest. No dyspnea on exertion, No excess mucus, no productive cough, No non-productive cough, No coughing up of blood.No change in color of mucus.No wheezing. Skin:  no rash or lesions. No  jaundice GU:  no dysuria, change in color of urine, no urgency or frequency. No straining to urinate.  No flank pain.  Musculoskeletal:  No joint pain or no joint swelling. No decreased range of motion. No back pain.  Psych:  No change in mood or affect. No depression or anxiety. No memory loss.  Neuro: no localizing neurological complaints, no tingling, no weakness, no double vision, no gait abnormality, no slurred speech, no  Otherwise ROS are negative except for above, 10 systems were reviewed  Past Medical History: Past Medical History  Diagnosis Date  . Diabetes mellitus   . Hypertension   . Arthritis   . History of drug abuse states quit herion in 2006  . Chronic back pain   . Frequency of urination   . Urge urinary incontinence   . Nocturia   . Edema of lower extremity history cellulitis jan 2013  . Methadone maintenance therapy patient   . Anxiety   . Depression   . History of chronic bronchitis   . Asthma   . Tendonitis    Past Surgical History  Procedure Laterality Date  . Appendectomy  1972  . Interstim implant placement  10/23/2011    Procedure: INTERSTIM IMPLANT FIRST STAGE;  Surgeon: Reece Packer, MD;  Location: [facility];  Service: Urology;  Laterality: N/A;  . Interstim implant placement  10/23/2011    Procedure: INTERSTIM IMPLANT SECOND STAGE;  Surgeon: Reece Packer, MD;  Location: [facility];  Service: Urology;  Laterality: N/A;     Medications: Prior to Admission medications   Medication Sig Start Date End Date Taking? Authorizing Provider  albuterol (PROVENTIL HFA;VENTOLIN HFA) 108 (90 BASE) MCG/ACT inhaler Inhale 2  puffs into the lungs every 6 (six) hours as needed. For wheezing   Yes Historical Provider, MD  atenolol (TENORMIN) 50 MG tablet Take 50 mg by mouth daily. 08/18/14  Yes Historical Provider, MD  cetirizine (ZYRTEC) 10 MG tablet Take 10 mg by mouth daily.   Yes Historical Provider, MD  furosemide  (LASIX) 40 MG tablet Take 1 tablet (40 mg total) by mouth daily. 11/16/12  Yes Lacretia Leigh, MD  HUMALOG MIX 75/25 KWIKPEN (75-25) 100 UNIT/ML Kwikpen Inject 30 Units into the skin 2 (two) times daily. 10/04/14  Yes Historical Provider, MD  meloxicam (MOBIC) 15 MG tablet Take 15 mg by mouth daily.   Yes Historical Provider, MD  metFORMIN (GLUCOPHAGE) 1000 MG tablet Take 1,000 mg by mouth 2 (two) times daily with a meal.    Yes Historical Provider, MD  methadone (DOLOPHINE) 10 MG/5ML solution Take 88 mg by mouth daily.   Yes Historical Provider, MD  Multiple Vitamin (MULTIVITAMIN WITH MINERALS) TABS tablet Take 1 tablet by mouth daily.   Yes Historical Provider, MD    Allergies:   Allergies  Allergen Reactions  . Gabapentin Hives and Other (See Comments)    Only when takes over 300 mg dose. "gives me the shakes"  . Vicodin [Hydrocodone-Acetaminophen] Hives, Diarrhea and Other (See Comments)    Stomach  cramps    Social History:  Ambulatory   Independently or cane,    From facility  [facility]   reports that she has been smoking Cigarettes.  She has a 20 pack-year smoking history. She has never used smokeless tobacco. She reports that she does not drink alcohol or use illicit drugs.    Family History: family history includes CAD in her father; Diabetes type II in her brother, mother, and sister; Hypertension in her brother and mother.    Physical Exam: Patient Vitals for the past 24 hrs:  BP Temp Temp src Pulse Resp SpO2  11/03/14 2237 (!) 114/54 mmHg - - 62 12 99 %  11/03/14 2200 (!) 114/54 mmHg - - 64 12 100 %  11/03/14 2100 131/63 mmHg - - 71 19 100 %  11/03/14 1820 - - - - - 99 %  11/03/14 1818 113/59 mmHg 97.5 F (36.4 C) Oral (!) 59 16 97 %    1. General:  in No Acute distress 2. Psychological:somnolent but  Oriented 3. Head/ENT:     Dry Mucous Membranes                          Head Non traumatic, neck supple                            Poor  Dentition 4. SKIN:  decreased Skin turgor,  Skin clean Dry and intact no rash 5. Heart: Regular rate and rhythm no Murmur, Rub or gallop 6. Lungs: Clear to auscultation bilaterally, no wheezes or crackles   7. Abdomen: Soft, non-tender, Non distended 8. Lower extremities: no clubbing, cyanosis, or edema 9. Neurologically Grossly intact, moving all 4 extremities equally 10. MSK: Normal range of motion  body mass index is unknown because there is no weight on file.   Labs on Admission:   Results for orders placed or performed during the hospital encounter of 11/03/14 (from the past 24 hour(s))  CBG monitoring, ED     Status: Abnormal   Collection Time: 11/03/14  6:22 PM  Result Value Ref Range   Glucose-Capillary 46 (L) 65 - 99 mg/dL  POC CBG, ED     Status: Abnormal   Collection Time: 11/03/14  7:15 PM  Result Value Ref Range   Glucose-Capillary 34 (LL) 65 - 99 mg/dL  Basic metabolic panel     Status: Abnormal   Collection Time: 11/03/14  7:33 PM  Result Value Ref Range   Sodium 143 135 - 145 mmol/L   Potassium 3.9 3.5 - 5.1 mmol/L   Chloride 104 101 - 111 mmol/L   CO2 31 22 - 32 mmol/L   Glucose, Bld 24 (LL) 65 - 99 mg/dL   BUN 18 6 - 20 mg/dL   Creatinine, Ser 1.24 (H) 0.44 - 1.00 mg/dL   Calcium 9.2 8.9 - 10.3 mg/dL   GFR calc non Af Amer 47 (L) >60 mL/min   GFR calc Af Amer 55 (L) >60 mL/min   Anion gap 8 5 - 15  CBC with Differential     Status: Abnormal   Collection Time: 11/03/14  7:33 PM  Result Value Ref Range   WBC 13.7 (H) 4.0 - 10.5 K/uL   RBC 4.81 3.87 - 5.11 MIL/uL   Hemoglobin 13.2 12.0 - 15.0 g/dL   HCT 41.0 36.0 - 46.0 %   MCV 85.2 78.0 - 100.0 fL   MCH 27.4 26.0 - 34.0 pg   MCHC 32.2 30.0 - 36.0 g/dL   RDW 12.4 11.5 - 15.5 %   Platelets 170 150 - 400 K/uL   Neutrophils Relative % 61 43 - 77 %   Neutro Abs 8.4 (H) 1.7 - 7.7 K/uL   Lymphocytes Relative 29 12 - 46 %   Lymphs Abs 4.0 0.7 - 4.0 K/uL   Monocytes Relative 9 3 - 12 %   Monocytes Absolute  1.2 (H) 0.1 - 1.0 K/uL   Eosinophils Relative 1 0 - 5 %   Eosinophils Absolute 0.1 0.0 - 0.7 K/uL   Basophils Relative 0 0 - 1 %   Basophils Absolute 0.0 0.0 - 0.1 K/uL  POC CBG, ED     Status: None   Collection Time: 11/03/14  8:56 PM  Result Value Ref Range   Glucose-Capillary 75 65 - 99 mg/dL  POC CBG, ED     Status: None   Collection Time: 11/03/14 10:42 PM  Result Value Ref Range   Glucose-Capillary 83 65 - 99 mg/dL    UA no evidence of UTI  Lab Results  Component Value Date   HGBA1C 10.2* 09/15/2012    CrCl cannot be calculated (Unknown ideal weight.).  BNP (last 3 results) No results for input(s): PROBNP in the last 8760 hours.  Other results:  I have pearsonaly reviewed this: ECG REPORT  Not obtained   There were no vitals filed for this visit.   Cultures:    Component Value Date/Time   SDES URINE, CLEAN CATCH 02/27/2012 1456   SPECREQUEST NONE 02/27/2012 1456   CULT  02/27/2012 1456    Multiple bacterial morphotypes present, none predominant. Suggest appropriate recollection if clinically indicated.   REPTSTATUS 02/29/2012 FINAL 02/27/2012 1456     Radiological Exams on Admission: No results found.  Chart has been reviewed  Family not  at  Bedside    Assessment/Plan 59 year old female history of diabetes and hypertension here with hypoglycemia blood sugar down to 24 initially secondary to unintentional insulin overdose   Present on Admission:  . Diabetes mellitus type 2 with complications, uncontrolled - hold  by mouth medications and NPH patient's dose of NPH should probably be decreased.  [name] HTN (hypertension)- hold Lasix for now and give gentle fluids, hold atenolol for now as this is interfering with patient's ability to perceive hypoglycemia. She may benefit from ACE inhibitor in the future once renal function is stable  . Hypoglycemia - continue D 10. Will need diabetic education probably will benefit from home health or upgrading to assisted  living to ensure the patient is taking correctly her insulin Chronic pain patient apparently is on methadone at home she gets from pain clinic. - Patient persistently somewhat somnolent. We'll hold on methadone for right now will need to conferm her dosing. Make sure the patient has not been over medicated which has contributed to confusion and unintentional insulin overdose  Prophylaxis:  Lovenox   CODE STATUS:  FULL CODE  as per patient    Disposition:   To independent living once workup is complete and patient is stable she'll probably benefit from home health or advancement to assisted living given to make sure she takes her medications appropriately  Other plan as per orders.  I have spent a total of 55 min on this admission  Rose [name] 11/03/2014, 11:23 PM  [facility]  Pager [phone]   after 2 AM please page floor coverage PA If 7AM-7PM, please contact the day team taking care of the patient  [email or web address]  Password [email or web address]

## 2014-11-03 NOTE — ED Notes (Signed)
[facility] CBG 46. EDP GLICK MADE AWARE. EDP AT [facility]

## 2014-11-03 NOTE — ED Notes (Signed)
Amie from lab called to report a glucose of 24

## 2014-11-03 NOTE — ED Provider Notes (Signed)
CSN: [id number]     Arrival date & time 11/03/14  1806 History   First MD Initiated Contact with Patient 11/03/14 1823     Chief Complaint: Hypoglycemia  (Consider location/radiation/quality/duration/timing/severity/associated sxs/prior Treatment) The history is provided by the patient, a relative and the EMS personnel.  59 -year-old female was brought in by ambulance after developing low blood sugar at home. She lives alone, and her son was there and noticed that she had passed out. EMS was called and noted initial blood sugar of 32. IV is started and she was given an amp of D50 and blood sugar went up to 140, but was down to 41 after arrival in the ED. Daughter states that she also had an episode several days ago blood sugar that was in the low 30s as well which they managed at home. Apparently, patient has not been eating well for the last week. She had also told her daughter that she was stopping all of her medications. Current medications include methadone, metoprolol, metformin, hydrochlorothiazide, furosemide.  Past Medical History  Diagnosis Date  . Diabetes mellitus   . Hypertension   . Arthritis   . History of drug abuse states quit herion in 2006  . Chronic back pain   . Frequency of urination   . Urge urinary incontinence   . Nocturia   . Edema of lower extremity history cellulitis jan 2013  . Methadone maintenance therapy patient   . Anxiety   . Depression   . History of chronic bronchitis   . Asthma   . Tendonitis    Past Surgical History  Procedure Laterality Date  . Appendectomy  1972  . Interstim implant placement  10/23/2011    Procedure: INTERSTIM IMPLANT FIRST STAGE;  Surgeon: Reece Packer, MD;  Location: [facility];  Service: Urology;  Laterality: N/A;  . Interstim implant placement  10/23/2011    Procedure: INTERSTIM IMPLANT SECOND STAGE;  Surgeon: Reece Packer, MD;  Location: [facility];  Service: Urology;  Laterality:  N/A;   No family history on file. History  Substance Use Topics  . Smoking status: Current Every Day Smoker -- 1.00 packs/day for 20 years    Types: Cigarettes  . Smokeless tobacco: Never Used  . Alcohol Use: No   OB History    No data available     Review of Systems  All other systems reviewed and are negative.     Allergies  Gabapentin and Vicodin  Home Medications   Prior to Admission medications   Medication Sig Start Date End Date Taking? Authorizing Provider  albuterol (PROVENTIL HFA;VENTOLIN HFA) 108 (90 BASE) MCG/ACT inhaler Inhale 2 puffs into the lungs every 6 (six) hours as needed. For wheezing    Historical Provider, MD  cetirizine (ZYRTEC) 10 MG tablet Take 10 mg by mouth daily.    Historical Provider, MD  furosemide (LASIX) 40 MG tablet Take 1 tablet (40 mg total) by mouth daily. 11/16/12   Lacretia Leigh, MD  hydrochlorothiazide (HYDRODIURIL) 25 MG tablet Take 25 mg by mouth every morning.     Historical Provider, MD  ibuprofen (ADVIL,MOTRIN) 600 MG tablet Take 600 mg by mouth every 6 (six) hours as needed.    Historical Provider, MD  insulin glargine (LANTUS) 100 UNIT/ML injection Inject 35 Units into the skin 2 (two) times daily.     Historical Provider, MD  meloxicam (MOBIC) 15 MG tablet Take 15 mg by mouth daily.    Historical Provider,  MD  metFORMIN (GLUCOPHAGE) 1000 MG tablet Take 1,000 mg by mouth 2 (two) times daily with a meal.     Historical Provider, MD  methadone (DOLOPHINE) 10 MG/5ML solution Take 88 mg by mouth daily.    Historical Provider, MD  metoprolol tartrate (LOPRESSOR) 25 MG tablet Take 25 mg by mouth daily.    Historical Provider, MD  Multiple Vitamin (MULTIVITAMIN WITH MINERALS) TABS tablet Take 1 tablet by mouth daily.    Historical Provider, MD  pregabalin (LYRICA) 150 MG capsule Take 150 mg by mouth 3 (three) times daily.    Historical Provider, MD   BP 113/59 mmHg  Pulse 59  Temp(Src) 97.5 F (36.4 C) (Oral)  Resp 16  SpO2 99%   LMP 07/31/2011 Physical Exam  Nursing note and vitals reviewed.  59 year old female, resting comfortably and in no acute distress. Vital signs are significant for borderline bradycardia. Oxygen saturation is 99%, which is normal. Head is normocephalic and atraumatic. PERRLA, EOMI. Oropharynx is clear. Neck is nontender and supple without adenopathy or JVD. Back is nontender and there is no CVA tenderness. Lungs are clear without rales, wheezes, or rhonchi. Chest is nontender. Heart has regular rate and rhythm without murmur. Abdomen is soft, flat, nontender without masses or hepatosplenomegaly and peristalsis is normoactive. Extremities have trace edema, full range of motion is present. Skin is warm and dry without rash. Neurologic: Mental status is normal after getting D50 in the emergency department, cranial nerves are intact, there are no motor or sensory deficits.  ED Course  Procedures (including critical care time) Labs Review Results for orders placed or performed during the hospital encounter of 74/12/87  Basic metabolic panel  Result Value Ref Range   Sodium 143 135 - 145 mmol/L   Potassium 3.9 3.5 - 5.1 mmol/L   Chloride 104 101 - 111 mmol/L   CO2 31 22 - 32 mmol/L   Glucose, Bld 24 (LL) 65 - 99 mg/dL   BUN 18 6 - 20 mg/dL   Creatinine, Ser 1.24 (H) 0.44 - 1.00 mg/dL   Calcium 9.2 8.9 - 10.3 mg/dL   GFR calc non Af Amer 47 (L) >60 mL/min   GFR calc Af Amer 55 (L) >60 mL/min   Anion gap 8 5 - 15  CBC with Differential  Result Value Ref Range   WBC 13.7 (H) 4.0 - 10.5 K/uL   RBC 4.81 3.87 - 5.11 MIL/uL   Hemoglobin 13.2 12.0 - 15.0 g/dL   HCT 41.0 36.0 - 46.0 %   MCV 85.2 78.0 - 100.0 fL   MCH 27.4 26.0 - 34.0 pg   MCHC 32.2 30.0 - 36.0 g/dL   RDW 12.4 11.5 - 15.5 %   Platelets 170 150 - 400 K/uL   Neutrophils Relative % 61 43 - 77 %   Neutro Abs 8.4 (H) 1.7 - 7.7 K/uL   Lymphocytes Relative 29 12 - 46 %   Lymphs Abs 4.0 0.7 - 4.0 K/uL   Monocytes Relative 9 3  - 12 %   Monocytes Absolute 1.2 (H) 0.1 - 1.0 K/uL   Eosinophils Relative 1 0 - 5 %   Eosinophils Absolute 0.1 0.0 - 0.7 K/uL   Basophils Relative 0 0 - 1 %   Basophils Absolute 0.0 0.0 - 0.1 K/uL  CBG monitoring, ED  Result Value Ref Range   Glucose-Capillary 46 (L) 65 - 99 mg/dL  POC CBG, ED  Result Value Ref Range   Glucose-Capillary 34 (LL)  65 - 99 mg/dL  POC CBG, ED  Result Value Ref Range   Glucose-Capillary 75 65 - 99 mg/dL   CRITICAL CARE Performed by: [email or web address] Total critical care time: 45 minutes Critical care time was exclusive of separately billable procedures and treating other patients. Critical care was necessary to treat or prevent imminent or life-threatening deterioration. Critical care was time spent personally by me on the following activities: development of treatment plan with patient and/or surrogate as well as nursing, discussions with consultants, evaluation of patient's response to treatment, examination of patient, obtaining history from patient or surrogate, ordering and performing treatments and interventions, ordering and review of laboratory studies, ordering and review of radiographic studies, pulse oximetry and re-evaluation of patient's condition.  MDM   Final diagnoses:  Hypoglycemia  Renal insufficiency    Recurrent hypoglycemia. She may need to have her insulin dose decreased. Renal function will be checked, and blood sugars will need to be monitored while in the ED. Old records are reviewed and she does have prior ED visits for both hyperglycemia and hypoglycemia.  Blood sugar has dropped down after second dose of dextrose. She had started to eat a snack but was unable to complete it. She will be started on a dextrose drip and will need to be admitted. Case is discussed with Dr. Roel Cluck of [facility] who agrees to admit the patient.  Delora Fuel, MD 44/03/47 [id number]

## 2014-11-03 NOTE — ED Notes (Signed)
Pt stated she needed to urinate,  Pt placed on bedpan but unable to urinate

## 2014-11-03 NOTE — ED Notes (Signed)
Pt ambulated to bathroom with assistance.

## 2014-11-04 ENCOUNTER — Encounter (HOSPITAL_COMMUNITY): Payer: Self-pay

## 2014-11-04 DIAGNOSIS — E162 Hypoglycemia, unspecified: Secondary | ICD-10-CM

## 2014-11-04 DIAGNOSIS — E118 Type 2 diabetes mellitus with unspecified complications: Secondary | ICD-10-CM

## 2014-11-04 DIAGNOSIS — I10 Essential (primary) hypertension: Secondary | ICD-10-CM

## 2014-11-04 LAB — COMPREHENSIVE METABOLIC PANEL
ALT: 9 U/L — ABNORMAL LOW (ref 14–54)
AST: 16 U/L (ref 15–41)
Albumin: 3.5 g/dL (ref 3.5–5.0)
Alkaline Phosphatase: 71 U/L (ref 38–126)
Anion gap: 8 (ref 5–15)
BUN: 14 mg/dL (ref 6–20)
CO2: 27 mmol/L (ref 22–32)
Calcium: 8.8 mg/dL — ABNORMAL LOW (ref 8.9–10.3)
Chloride: 108 mmol/L (ref 101–111)
Creatinine, Ser: 1.15 mg/dL — ABNORMAL HIGH (ref 0.44–1.00)
GFR calc Af Amer: >60 mL/min (ref >60)
GFR calc non Af Amer: 52 mL/min — ABNORMAL LOW (ref >60)
Glucose, Bld: 90 mg/dL (ref 65–99)
Potassium: 4.1 mmol/L (ref 3.5–5.1)
Sodium: 143 mmol/L (ref 135–145)
Total Bilirubin: 0.5 mg/dL (ref 0.3–1.2)
Total Protein: 7 g/dL (ref 6.5–8.1)

## 2014-11-04 LAB — TSH: TSH: 2.932 u[IU]/mL (ref 0.350–4.500)

## 2014-11-04 LAB — URINALYSIS, ROUTINE W REFLEX MICROSCOPIC
Bilirubin Urine: NEGATIVE
Glucose, UA: NEGATIVE mg/dL
Hgb urine dipstick: NEGATIVE
Ketones, ur: NEGATIVE mg/dL
Leukocytes, UA: NEGATIVE
Nitrite: NEGATIVE
Protein, ur: NEGATIVE mg/dL
Specific Gravity, Urine: 1.008 (ref 1.005–1.030)
Urobilinogen, UA: 0.2 mg/dL (ref 0.0–1.0)
pH: 6.5 (ref 5.0–8.0)

## 2014-11-04 LAB — CBC
HCT: 37.1 % (ref 36.0–46.0)
Hemoglobin: 12.1 g/dL (ref 12.0–15.0)
MCH: 27.9 pg (ref 26.0–34.0)
MCHC: 32.6 g/dL (ref 30.0–36.0)
MCV: 85.7 fL (ref 78.0–100.0)
Platelets: 133 10*3/uL — ABNORMAL LOW (ref 150–400)
RBC: 4.33 MIL/uL (ref 3.87–5.11)
RDW: 12.6 % (ref 11.5–15.5)
WBC: 10.8 10*3/uL — ABNORMAL HIGH (ref 4.0–10.5)

## 2014-11-04 LAB — GLUCOSE, CAPILLARY
Glucose-Capillary: 101 mg/dL — ABNORMAL HIGH (ref 65–99)
Glucose-Capillary: 101 mg/dL — ABNORMAL HIGH (ref 65–99)
Glucose-Capillary: 137 mg/dL — ABNORMAL HIGH (ref 65–99)
Glucose-Capillary: 146 mg/dL — ABNORMAL HIGH (ref 65–99)
Glucose-Capillary: 170 mg/dL — ABNORMAL HIGH (ref 65–99)
Glucose-Capillary: 176 mg/dL — ABNORMAL HIGH (ref 65–99)
Glucose-Capillary: 177 mg/dL — ABNORMAL HIGH (ref 65–99)
Glucose-Capillary: 67 mg/dL (ref 65–99)
Glucose-Capillary: 74 mg/dL (ref 65–99)
Glucose-Capillary: 94 mg/dL (ref 65–99)

## 2014-11-04 LAB — PHOSPHORUS: Phosphorus: 3 mg/dL (ref 2.5–4.6)

## 2014-11-04 LAB — MAGNESIUM: Magnesium: 1.8 mg/dL (ref 1.7–2.4)

## 2014-11-04 MED ORDER — ENOXAPARIN SODIUM 40 MG/0.4ML ~~LOC~~ SOLN
40.0000 mg | SUBCUTANEOUS | Status: DC
  Administered 2014-11-04 – 2014-11-05 (×2): 40 mg via SUBCUTANEOUS
  Filled 2014-11-04 (×2): qty 0.4

## 2014-11-04 MED ORDER — LORATADINE 10 MG PO TABS
10.0000 mg | ORAL_TABLET | Freq: Every day | ORAL | Status: DC
  Administered 2014-11-04 – 2014-11-05 (×2): 10 mg via ORAL
  Filled 2014-11-04 (×3): qty 1

## 2014-11-04 MED ORDER — MELOXICAM 15 MG PO TABS
15.0000 mg | ORAL_TABLET | Freq: Every day | ORAL | Status: DC
  Administered 2014-11-04: 15 mg via ORAL
  Filled 2014-11-04: qty 1

## 2014-11-04 MED ORDER — METHADONE HCL 10 MG/ML PO CONC
88.0000 mg | Freq: Every day | ORAL | Status: DC
  Administered 2014-11-04 – 2014-11-05 (×2): 88 mg via ORAL
  Filled 2014-11-04 (×3): qty 8.8

## 2014-11-04 MED ORDER — ONDANSETRON HCL 4 MG PO TABS: 4.0000 mg | ORAL_TABLET | Freq: Four times a day (QID) | ORAL | Status: DC | PRN

## 2014-11-04 MED ORDER — SODIUM CHLORIDE 0.9 % IJ SOLN
3.0000 mL | Freq: Two times a day (BID) | INTRAMUSCULAR | Status: DC
  Administered 2014-11-04 (×3): 3 mL via INTRAVENOUS

## 2014-11-04 MED ORDER — ACETAMINOPHEN 650 MG RE SUPP: 650.0000 mg | Freq: Four times a day (QID) | RECTAL | Status: DC | PRN

## 2014-11-04 MED ORDER — ONDANSETRON HCL 4 MG/2ML IJ SOLN: 4.0000 mg | Freq: Four times a day (QID) | INTRAMUSCULAR | Status: DC | PRN

## 2014-11-04 MED ORDER — NICOTINE 21 MG/24HR TD PT24
21.0000 mg | MEDICATED_PATCH | Freq: Every day | TRANSDERMAL | Status: DC
  Administered 2014-11-04 – 2014-11-05 (×2): 21 mg via TRANSDERMAL
  Filled 2014-11-04 (×2): qty 1

## 2014-11-04 MED ORDER — ATENOLOL 50 MG PO TABS
50.0000 mg | ORAL_TABLET | Freq: Every day | ORAL | Status: DC
  Administered 2014-11-04 – 2014-11-05 (×2): 50 mg via ORAL
  Filled 2014-11-04 (×2): qty 1

## 2014-11-04 MED ORDER — SODIUM CHLORIDE 4 MEQ/ML IV SOLN
INTRAVENOUS | Status: DC
  Administered 2014-11-04 (×2): via INTRAVENOUS
  Filled 2014-11-04 (×4): qty 1000

## 2014-11-04 MED ORDER — ACETAMINOPHEN 325 MG PO TABS: 650.0000 mg | ORAL_TABLET | Freq: Four times a day (QID) | ORAL | Status: DC | PRN

## 2014-11-04 NOTE — Clinical Social Work Note (Signed)
Clinical Social Work Assessment  Patient Details  Name: Rose [name] MRN: [id number] Date of Birth: May 30, 1955  Date of referral:  11/04/14               Reason for consult:  Other (Comment Required) (consult stated admitted from independent living)                Permission sought to share information with:  Case Manager Permission granted to share information::  Yes, Verbal Permission Granted  Name::        Agency::     Relationship::     Contact Information:     Housing/Transportation Living arrangements for the past 2 months:  [place], [place] of Information:  Patient Patient Interpreter Needed:  None Criminal Activity/Legal Involvement Pertinent to Current Situation/Hospitalization:    Significant Relationships:  Adult Children, Siblings Lives with:  Self Do you feel safe going back to the place where you live?  Yes Need for family participation in patient care:  No (Coment)  Care giving concerns:  No caregiver   Social Worker assessment / plan:  CSW reviewed consult for "patient admitted from independent living.  CSW unsure of consult request so met with Pt and prompted her to discuss her history and current needs. CSW prompted pt to discuss what kind of system she utilizes to keep up with her insulin needs.  CSW explained role of CSW and encouraged pt to explore her thoughts and feelings related to this hospital stay.  CSW provided supportive listening and will follow up with case manager to assess if any services may go into pt's home.  CSW will also ask RN/case manager to provide more information regarding pt's blood sugar results/numbers and what to do.  Would be helpful if Pt could have these numbers documented.   Employment status:  Disabled (Comment on whether or not currently receiving Disability) Insurance information:    PT Recommendations:  Not assessed at this time Information / Referral to community resources:      Patient/Family's Response to care:  Pt discussed living at an assisted living called [facility] but CSW could not locate this ALF in the system.  Pt stated that where she lives there is a dining room downstairs that she can eat at and there are nurses available for services.  Pt discussed her recent confusion with her blood sugar and how she thinks she took an extra shot of insulin by accident.  Pt stated that she usually uses a book to record when she gives herself insulin but she did not on the day she took extra.  Pt stated that she was stressed on this day because she found out her car needed 600 worth of repairs so she did not utilize her system for recording her blood sugar and insulin.  Pt stated that she is on disability and receives an income monthly.  Pt stated that she had just been to her doctor on July 24th to assess her blood sugar and that she has a follow up appointment on august 18th.  Pt could not stated that she required any services when she discharges.  Patient/Family's Understanding of and Emotional Response to Diagnosis, Current Treatment, and Prognosis:  Pt appears to need some more education on her blood sugar.  Pt could not accurately discuss the numbers she requires for when she gives herself a shot.  Pt also unclear as to when her numbers are high what she should do medically.  Emotional Assessment Appearance:    Attitude/Demeanor/Rapport:   (pleasant and cooperative) Affect (typically observed):  Accepting Orientation:  Oriented to Self, Oriented to Place, Oriented to  Time, Oriented to Situation Alcohol / Substance use:    Psych involvement (Current and /or in the community):     Discharge Needs  Concerns to be addressed:    Readmission within the last 30 days:  No Current discharge risk:    Barriers to Discharge:  No Barriers Identified   Carlean Jews, LCSW 11/04/2014, 2:50 PM

## 2014-11-04 NOTE — Progress Notes (Signed)
Inpatient Diabetes Program Recommendations  AACE/ADA: New Consensus Statement on Inpatient Glycemic Control (2013)  Target Ranges:  Prepandial:   less than 140 mg/dL      Peak postprandial:   less than 180 mg/dL (1-2 hours)      Critically ill patients:  140 - 180 mg/dL   Reason for Admission: Hypoglycemia  Diabetes history: DM 2 Outpatient Diabetes medications: Humalog 75/25 30 units BID, Metformin 1,000 mg BID Current orders for Inpatient glycemic control: None  Inpatient Diabetes Program Recommendations Insulin - Basal: Patient may need 70/30 added later today or in the am as the basal insulin for her 3 doses yesterday on 8/5 is still on board. Patient was taking an equivalent of 45 units of basal insulin with her Humalog 75/25 dose. Please consider starting 70/30 this evening 15-20 units BID (15 units BID = 21 units of basal, 20 units =28 units of basal).   Note: Patient has had inadequate food intake and has had problems with her medications per family in H&P. For discharge patient dosing options with 75/25:  16 units BID = 24 units of total basal    18 units BID = 27 units of total basal    20 units BID = 30 units of total basal  22 units BID = 33 units of total basal  24 units BID = 36 units of total basal     Thanks,  Tama Headings RN, MSN, [facility] Inpatient Diabetes Coordinator Team Pager [phone]

## 2014-11-04 NOTE — Progress Notes (Signed)
Pt cbg 69 given 4 fl ounces of orange juice according to hypoglycemia protocol.

## 2014-11-04 NOTE — Progress Notes (Signed)
[facility] PROGRESS NOTE  Assessment/Plan: Hypoglycemia/AKI/Diabetes mellitus type 2 with complications, uncontrolled - Insulin was held on admission she was started on D5. - Given her hypoglycemic event was due to his acute renal failure, her baseline creatinine is less than 1.0. - Is improving with hydration. Continue to hold insulins continue D5. - Discontinue metabolic try to avoid NSAIDs. No additional narcotics resume her methadone. Will probably benefit from an ACE inhibitor as an outpatient. - Continue her metformin due to acute renal failure.  Essential  HTN (hypertension) Stranding up. Continue to monitor.    Code Status: full Family Communication: none  Disposition Plan: inpatient   Consultants:  none  Procedures:  CT head  Antibiotics:  None  HPI/Subjective: Feels better no complain, once nicotine patch. Objective: Filed Vitals:   11/03/14 2237 11/04/14 0000 11/04/14 0012 11/04/14 0343  BP: 114/54  142/68 120/57  Pulse: 62  61 63  Temp:   98.1 F (36.7 C) 98 F (36.7 C)  TempSrc:   Oral Oral  Resp: 12  12 12   Height:  5\' 3"  (1.6 m)  5\' 3"  (1.6 m)  Weight:  76.1 kg (167 lb 12.3 oz)  73.5 kg (162 lb 0.6 oz)  SpO2: 99%  100% 99%    Intake/Output Summary (Last 24 hours) at 11/04/14 1133 Last data filed at 11/04/14 1019  Gross per 24 hour  Intake 631.25 ml  Output    750 ml  Net -118.75 ml   Filed Weights   11/04/14 0000 11/04/14 0343  Weight: 76.1 kg (167 lb 12.3 oz) 73.5 kg (162 lb 0.6 oz)    Exam:  General: Alert, awake, oriented x3, in no acute distress.  HEENT: No bruits, no goiter.  Heart: Regular rate and rhythm. Lungs: Good air movement, clear Abdomen: Soft, nontender, nondistended, positive bowel sounds.  Neuro: Grossly intact, nonfocal.   Data Reviewed: Basic Metabolic Panel:  Recent Labs Lab 11/03/14 1933 11/04/14 0420  NA 143 143  K 3.9 4.1  CL 104 108  CO2 31 27  GLUCOSE 24* 90  BUN 18 14  CREATININE  1.24* 1.15*  CALCIUM 9.2 8.8*  MG  --  1.8  PHOS  --  3.0   Liver Function Tests:  Recent Labs Lab 11/04/14 0420  AST 16  ALT 9*  ALKPHOS 71  BILITOT 0.5  PROT 7.0  ALBUMIN 3.5   No results for input(s): LIPASE, AMYLASE in the last 168 hours. No results for input(s): AMMONIA in the last 168 hours. CBC:  Recent Labs Lab 11/03/14 1933 11/04/14 0420  WBC 13.7* 10.8*  NEUTROABS 8.4*  --   HGB 13.2 12.1  HCT 41.0 37.1  MCV 85.2 85.7  PLT 170 133*   Cardiac Enzymes: No results for input(s): CKTOTAL, CKMB, CKMBINDEX, TROPONINI in the last 168 hours. BNP (last 3 results) No results for input(s): BNP in the last 8760 hours.  ProBNP (last 3 results) No results for input(s): PROBNP in the last 8760 hours.  CBG:  Recent Labs Lab 11/04/14 0159 11/04/14 0303 11/04/14 0358 11/04/14 0546 11/04/14 0802  GLUCAP 67 137* 101* 101* 94    No results found for this or any previous visit (from the past 240 hour(s)).   Studies: No results found.  Scheduled Meds: . atenolol  50 mg Oral Daily  . enoxaparin (LOVENOX) injection  40 mg Subcutaneous Q24H  . loratadine  10 mg Oral Daily  . meloxicam  15 mg Oral Daily  . sodium chloride  3 mL Intravenous Q12H   Continuous Infusions: . D-10-0.45% Sodium Chloride with KCL 40 meq/L 1000 ml 75 mL/hr at 11/04/14 2493    Time Spent: 25 min   Charlynne Cousins  [facility] Pager [phone]. If 7PM-7AM, please contact night-coverage at [email or web address], password [facility] 11/04/2014, 11:33 AM  LOS: 1 day

## 2014-11-05 LAB — GLUCOSE, CAPILLARY
Glucose-Capillary: 135 mg/dL — ABNORMAL HIGH (ref 65–99)
Glucose-Capillary: 143 mg/dL — ABNORMAL HIGH (ref 65–99)
Glucose-Capillary: 180 mg/dL — ABNORMAL HIGH (ref 65–99)

## 2014-11-05 MED ORDER — HUMALOG MIX 75/25 KWIKPEN (75-25) 100 UNIT/ML ~~LOC~~ SUPN: PEN_INJECTOR | SUBCUTANEOUS | Status: DC

## 2014-11-05 NOTE — Discharge Summary (Signed)
Physician Discharge Summary  Rose [name] BSJ:[id number] DOB: 01-16-1956 DOA: 11/03/2014  PCP: Antonietta Jewel, MD  Admit date: 11/03/2014 Discharge date: 11/05/2014  Time spent: 35 minutes  Recommendations for Outpatient Follow-up:  1. Follow-up with PCP in 3-4 weeks to titrate her insulin as needed.    Discharge Diagnoses:  Active Problems:   Diabetes mellitus type 2 with complications, uncontrolled   HTN (hypertension)   Hypoglycemia   Discharge Condition: stable  Diet recommendation: carb modified  Filed Weights   11/04/14 0000 11/04/14 0343  Weight: 76.1 kg (167 lb 12.3 oz) 73.5 kg (162 lb 0.6 oz)    History of present illness:  59 year old female with past medical history of diabetes and hypertension also history of IV drug abuse now on methadone that comes in for hypoglycemia. She relates it happened 3 days prior to admission and she was confused on admission her blood glucose was 24.   Hospital Course:  Hypoglycemia/acute kidney injury/diabetes mellitus type 2 uncontrolled: On admission her insulin was held she was started on D10. And her blood glucose corrected. Meloxicam was discontinued as discovered be contributing to her acute renal failure. She will go home off the meloxicam follow-up with her primary care doctor to titrate her insulin as tolerated. She might benefit from an Ace. On admission also her metformin was held. It can be resumed as an outpatient. She will go home on 70 08/22/2013 units in the morning and 10 units in the evening.  Procedures:  CT head negative (i.e. Studies not automatically included, echos, thoracentesis, etc; not x-rays)  Consultations:  None  Discharge Exam: Filed Vitals:   11/05/14 0548  BP: 148/73  Pulse: 64  Temp: 98 F (36.7 C)  Resp: 12    General: Awake alert and oriented 3 Cardiovascular: regular rate and rhythm Respiratory: Good air movement and clear to auscultation.  Discharge Instructions   Discharge  Instructions    Diet - low sodium heart healthy    Complete by:  As directed      Increase activity slowly    Complete by:  As directed           Current Discharge Medication List    CONTINUE these medications which have CHANGED   Details  HUMALOG MIX 75/25 KWIKPEN (75-25) 100 UNIT/ML Kwikpen Take 15 units in the morning and 10 units in the evening. Qty: 15 mL, Refills: 0      CONTINUE these medications which have NOT CHANGED   Details  albuterol (PROVENTIL HFA;VENTOLIN HFA) 108 (90 BASE) MCG/ACT inhaler Inhale 2 puffs into the lungs every 6 (six) hours as needed. For wheezing    atenolol (TENORMIN) 50 MG tablet Take 50 mg by mouth daily. Refills: 3    cetirizine (ZYRTEC) 10 MG tablet Take 10 mg by mouth daily.    furosemide (LASIX) 40 MG tablet Take 1 tablet (40 mg total) by mouth daily. Qty: 30 tablet, Refills: 0    meloxicam (MOBIC) 15 MG tablet Take 15 mg by mouth daily.    metFORMIN (GLUCOPHAGE) 1000 MG tablet Take 1,000 mg by mouth 2 (two) times daily with a meal.     methadone (DOLOPHINE) 10 MG/5ML solution Take 88 mg by mouth daily.    Multiple Vitamin (MULTIVITAMIN WITH MINERALS) TABS tablet Take 1 tablet by mouth daily.       Allergies  Allergen Reactions  . Gabapentin Hives and Other (See Comments)    Only when takes over 300 mg dose. "gives me the shakes"  .  Vicodin [Hydrocodone-Acetaminophen] Hives, Diarrhea and Other (See Comments)    Stomach  cramps   Follow-up Information    Follow up with [facility], MD In 3 weeks.   Specialty:  Internal Medicine   Why:  hospital follow up   Contact information:   [address] [place] [address] [phone]        The results of significant diagnostics from this hospitalization (including imaging, microbiology, ancillary and laboratory) are listed below for reference.    Significant Diagnostic Studies: No results found.  Microbiology: No results found for this or any previous visit (from  the past 240 hour(s)).   Labs: Basic Metabolic Panel:  Recent Labs Lab 11/03/14 1933 11/04/14 0420  NA 143 143  K 3.9 4.1  CL 104 108  CO2 31 27  GLUCOSE 24* 90  BUN 18 14  CREATININE 1.24* 1.15*  CALCIUM 9.2 8.8*  MG  --  1.8  PHOS  --  3.0   Liver Function Tests:  Recent Labs Lab 11/04/14 0420  AST 16  ALT 9*  ALKPHOS 71  BILITOT 0.5  PROT 7.0  ALBUMIN 3.5   No results for input(s): LIPASE, AMYLASE in the last 168 hours. No results for input(s): AMMONIA in the last 168 hours. CBC:  Recent Labs Lab 11/03/14 1933 11/04/14 0420  WBC 13.7* 10.8*  NEUTROABS 8.4*  --   HGB 13.2 12.1  HCT 41.0 37.1  MCV 85.2 85.7  PLT 170 133*   Cardiac Enzymes: No results for input(s): CKTOTAL, CKMB, CKMBINDEX, TROPONINI in the last 168 hours. BNP: BNP (last 3 results) No results for input(s): BNP in the last 8760 hours.  ProBNP (last 3 results) No results for input(s): PROBNP in the last 8760 hours.  CBG:  Recent Labs Lab 11/04/14 1639 11/04/14 1929 11/04/14 2343 11/05/14 0546 11/05/14 0733  GLUCAP 170* 177* 146* 135* 143*       Signed:  Charlynne Cousins  [facility] 11/05/2014, 11:39 AM

## 2014-11-05 NOTE — Progress Notes (Signed)
Chaplain referred to Rose [name] by a concerned nurse. Rose Hone is in spiritual turmoil over the recent admittance of her brother to [facility]. She states he was found unresponsive after an episode with a heart pacer. There seems to be more to the story than this. Her spiritual pain is that she wants to stay in the hospital to get her illnesses under control, but also is pulled to go to [facility] to be with her brother. Discussion on setting priorities - getting well so that you will be strong enough to help your brother. Prayer and calming exercises assisted Rose Crafton focus her priorities.  Rose Oien was made aware of the availability of chaplains at all times  Sallee Lange. Regis Wiland, [place]

## 2014-11-07 ENCOUNTER — Emergency Department (HOSPITAL_COMMUNITY)
Admission: EM | Admit: 2014-11-07 | Discharge: 2014-11-08 | Disposition: A | Discharge status: Home / Self Care | Payer: Medicare Other | Attending: Emergency Medicine | Admitting: Emergency Medicine

## 2014-11-07 ENCOUNTER — Encounter (HOSPITAL_COMMUNITY): Payer: Self-pay | Admitting: Emergency Medicine

## 2014-11-07 DIAGNOSIS — G8929 Other chronic pain: Secondary | ICD-10-CM | POA: Insufficient documentation

## 2014-11-07 DIAGNOSIS — I10 Essential (primary) hypertension: Secondary | ICD-10-CM | POA: Diagnosis not present

## 2014-11-07 DIAGNOSIS — J45909 Unspecified asthma, uncomplicated: Secondary | ICD-10-CM | POA: Diagnosis not present

## 2014-11-07 DIAGNOSIS — Z8739 Personal history of other diseases of the musculoskeletal system and connective tissue: Secondary | ICD-10-CM | POA: Insufficient documentation

## 2014-11-07 DIAGNOSIS — Z87448 Personal history of other diseases of urinary system: Secondary | ICD-10-CM | POA: Diagnosis not present

## 2014-11-07 DIAGNOSIS — Z79899 Other long term (current) drug therapy: Secondary | ICD-10-CM | POA: Insufficient documentation

## 2014-11-07 DIAGNOSIS — Z720 Tobacco use: Secondary | ICD-10-CM | POA: Insufficient documentation

## 2014-11-07 DIAGNOSIS — F419 Anxiety disorder, unspecified: Secondary | ICD-10-CM | POA: Diagnosis not present

## 2014-11-07 DIAGNOSIS — E162 Hypoglycemia, unspecified: Secondary | ICD-10-CM

## 2014-11-07 DIAGNOSIS — E11649 Type 2 diabetes mellitus with hypoglycemia without coma: Secondary | ICD-10-CM | POA: Diagnosis present

## 2014-11-07 LAB — CBC WITH DIFFERENTIAL/PLATELET
Basophils Absolute: 0 10*3/uL (ref 0.0–0.1)
Basophils Relative: 0 % (ref 0–1)
Eosinophils Absolute: 0.1 10*3/uL (ref 0.0–0.7)
Eosinophils Relative: 1 % (ref 0–5)
HCT: 42 % (ref 36.0–46.0)
Hemoglobin: 13.6 g/dL (ref 12.0–15.0)
Lymphocytes Relative: 29 % (ref 12–46)
Lymphs Abs: 4.1 10*3/uL — ABNORMAL HIGH (ref 0.7–4.0)
MCH: 27.4 pg (ref 26.0–34.0)
MCHC: 32.4 g/dL (ref 30.0–36.0)
MCV: 84.5 fL (ref 78.0–100.0)
Monocytes Absolute: 0.6 10*3/uL (ref 0.1–1.0)
Monocytes Relative: 4 % (ref 3–12)
Neutro Abs: 9.5 10*3/uL — ABNORMAL HIGH (ref 1.7–7.7)
Neutrophils Relative %: 66 % (ref 43–77)
Platelets: 140 10*3/uL — ABNORMAL LOW (ref 150–400)
RBC: 4.97 MIL/uL (ref 3.87–5.11)
RDW: 12.5 % (ref 11.5–15.5)
WBC: 14.3 10*3/uL — ABNORMAL HIGH (ref 4.0–10.5)

## 2014-11-07 LAB — BASIC METABOLIC PANEL
Anion gap: 12 (ref 5–15)
BUN: 16 mg/dL (ref 6–20)
CO2: 26 mmol/L (ref 22–32)
Calcium: 9.1 mg/dL (ref 8.9–10.3)
Chloride: 102 mmol/L (ref 101–111)
Creatinine, Ser: 1.43 mg/dL — ABNORMAL HIGH (ref 0.44–1.00)
GFR calc Af Amer: 46 mL/min — ABNORMAL LOW (ref >60)
GFR calc non Af Amer: 40 mL/min — ABNORMAL LOW (ref >60)
Glucose, Bld: 174 mg/dL — ABNORMAL HIGH (ref 65–99)
Potassium: 3.5 mmol/L (ref 3.5–5.1)
Sodium: 140 mmol/L (ref 135–145)

## 2014-11-07 LAB — URINALYSIS, ROUTINE W REFLEX MICROSCOPIC
Bilirubin Urine: NEGATIVE
Glucose, UA: NEGATIVE mg/dL
Ketones, ur: NEGATIVE mg/dL
Nitrite: NEGATIVE
Protein, ur: NEGATIVE mg/dL
Specific Gravity, Urine: 1.012 (ref 1.005–1.030)
Urobilinogen, UA: 1 mg/dL (ref 0.0–1.0)
pH: 6 (ref 5.0–8.0)

## 2014-11-07 LAB — RAPID URINE DRUG SCREEN, HOSP PERFORMED
Amphetamines: NOT DETECTED
Barbiturates: NOT DETECTED
Benzodiazepines: NOT DETECTED
Cocaine: NOT DETECTED
Opiates: NOT DETECTED
Tetrahydrocannabinol: NOT DETECTED

## 2014-11-07 LAB — CBG MONITORING, ED
Glucose-Capillary: 198 mg/dL — ABNORMAL HIGH (ref 65–99)
Glucose-Capillary: 218 mg/dL — ABNORMAL HIGH (ref 65–99)
Glucose-Capillary: 83 mg/dL (ref 65–99)

## 2014-11-07 LAB — URINE MICROSCOPIC-ADD ON

## 2014-11-07 NOTE — ED Provider Notes (Signed)
CSN: [id number]     Arrival date & time 11/07/14  2156 History   First MD Initiated Contact with Patient 11/07/14 2157     Chief Complaint  Patient presents with  . Hypoglycemia     (Consider location/radiation/quality/duration/timing/severity/associated sxs/prior Treatment) HPI    PCP: HASSAN,SAMI, MD Blood pressure 121/66, pulse 68, temperature 98.3 F (36.8 C), temperature source Oral, resp. rate 17, last menstrual period 07/31/2011, SpO2 98 %.  Raimi Samad is a 59 y.o.female with a significant PMH of diabetes, hypertension, arthritis, hx of drug abuse, chronic back pain, frequency of urination, nocturia, edema of lower extremities, methadone maintenance therapy, anxiety, asthma, and tendonitis presents to the ER with complaints of hypogylcemia. She was brought to the ER by EMS where she required glucagon en route for a CBG of 46, when she arrived to the ER her sugar improved to 54. Patients reports feeling asymptomatic now and does not know why her blood sugar dropped but reports it does that sometimes.  She denies loss of consciousness, diaphoresis, fever, headache, weakness (general or focal), confusion, change of vision,  neck pain, dysphagia, aphagia, chest pain, shortness of breath,  back pain, abdominal pains, nausea, vomiting, diarrhea, lower extremity swelling, rash.  Past Medical History  Diagnosis Date  . Diabetes mellitus   . Hypertension   . Arthritis   . History of drug abuse states quit herion in 2006  . Chronic back pain   . Frequency of urination   . Urge urinary incontinence   . Nocturia   . Edema of lower extremity history cellulitis jan 2013  . Methadone maintenance therapy patient   . Anxiety   . Depression   . History of chronic bronchitis   . Asthma   . Tendonitis    Past Surgical History  Procedure Laterality Date  . Appendectomy  1972  . Interstim implant placement  10/23/2011    Procedure: INTERSTIM IMPLANT FIRST STAGE;  Surgeon: Reece Packer, MD;  Location: [facility];  Service: Urology;  Laterality: N/A;  . Interstim implant placement  10/23/2011    Procedure: INTERSTIM IMPLANT SECOND STAGE;  Surgeon: Reece Packer, MD;  Location: [facility];  Service: Urology;  Laterality: N/A;   Family History  Problem Relation Age of Onset  . Hypertension Mother   . Diabetes type II Mother   . CAD Father   . Hypertension Brother   . Diabetes type II Brother   . Diabetes type II Sister    Social History  Substance Use Topics  . Smoking status: Current Every Day Smoker -- 1.00 packs/day for 20 years    Types: Cigarettes  . Smokeless tobacco: Never Used  . Alcohol Use: No   OB History    No data available     Review of Systems  10 Systems reviewed and are negative for acute change except as noted in the HPI.    Allergies  Gabapentin and Vicodin  Home Medications   Prior to Admission medications   Medication Sig Start Date End Date Taking? Authorizing Provider  albuterol (PROVENTIL HFA;VENTOLIN HFA) 108 (90 BASE) MCG/ACT inhaler Inhale 2 puffs into the lungs every 6 (six) hours as needed. For wheezing    Historical Provider, MD  atenolol (TENORMIN) 50 MG tablet Take 50 mg by mouth daily. 08/18/14   Historical Provider, MD  cetirizine (ZYRTEC) 10 MG tablet Take 10 mg by mouth daily.    Historical Provider, MD  furosemide (LASIX) 40 MG tablet  Take 1 tablet (40 mg total) by mouth daily. 11/16/12   Lacretia Leigh, MD  HUMALOG MIX 75/25 KWIKPEN (75-25) 100 UNIT/ML Kwikpen Take 15 units in the morning and 10 units in the evening. 11/05/14   Charlynne Cousins, MD  meloxicam (MOBIC) 15 MG tablet Take 15 mg by mouth daily.    Historical Provider, MD  metFORMIN (GLUCOPHAGE) 1000 MG tablet Take 1,000 mg by mouth 2 (two) times daily with a meal.     Historical Provider, MD  methadone (DOLOPHINE) 10 MG/5ML solution Take 88 mg by mouth daily.    Historical Provider, MD  Multiple Vitamin  (MULTIVITAMIN WITH MINERALS) TABS tablet Take 1 tablet by mouth daily.    Historical Provider, MD   BP 121/66 mmHg  Pulse 68  Temp(Src) 98.3 F (36.8 C) (Oral)  Resp 17  SpO2 98%  LMP 07/31/2011 Physical Exam  Constitutional: She appears well-developed and well-nourished. No distress.  HENT:  Head: Normocephalic and atraumatic.  Eyes: Pupils are equal, round, and reactive to light.  Neck: Normal range of motion. Neck supple.  Cardiovascular: Normal rate and regular rhythm.   Pulmonary/Chest: Effort normal.  Abdominal: Soft.  Neurological: She is alert.  Skin: Skin is warm and dry.  Nursing note and vitals reviewed.   ED Course  Procedures (including critical care time) Labs Review Labs Reviewed  CBC WITH DIFFERENTIAL/PLATELET - Abnormal; Notable for the following:    WBC 14.3 (*)    Platelets 140 (*)    Neutro Abs 9.5 (*)    Lymphs Abs 4.1 (*)    All other components within normal limits  BASIC METABOLIC PANEL - Abnormal; Notable for the following:    Glucose, Bld 174 (*)    Creatinine, Ser 1.43 (*)    GFR calc non Af Amer 40 (*)    GFR calc Af Amer 46 (*)    All other components within normal limits  URINALYSIS, ROUTINE W REFLEX MICROSCOPIC (NOT AT [facility]) - Abnormal; Notable for the following:    Hgb urine dipstick SMALL (*)    Leukocytes, UA TRACE (*)    All other components within normal limits  URINE MICROSCOPIC-ADD ON - Abnormal; Notable for the following:    Squamous Epithelial / LPF FEW (*)    Bacteria, UA FEW (*)    Casts HYALINE CASTS (*)    All other components within normal limits  CBG MONITORING, ED - Abnormal; Notable for the following:    Glucose-Capillary 218 (*)    All other components within normal limits  CBG MONITORING, ED - Abnormal; Notable for the following:    Glucose-Capillary 198 (*)    All other components within normal limits  URINE RAPID DRUG SCREEN, HOSP PERFORMED  CBG MONITORING, ED    Imaging Review No results found.   EKG  Interpretation None      MDM   Final diagnoses:  Hypoglycemia   The patients presented to the ER by EMS after being found hypoglycemic at home. This improved with treatment and food given in the ED.  Her CBC showed an elevated WBC count at 14.3, pt denies any symptoms of coughing, dysuria, abdominal pain, diarrhea or sources of infection. Her BMP shows an elevated creatinine and lower GFR, these values are abnormal at baseline and it is mildly worse today due to dehydration but her sodium, potassium, chloride, bicarb and gap are all WNL. UA, small amount of hemoglobin, trace leukocytes, few yeast. A urine culture was not added on during this  visit but an email has been sent to her primary care provider requesting he recheck her urine.  Otherwise after monitoring the patient for a few hours her glucose remained stable at 198 prior to discharge. Patient advised to check her glucose q 2 hours during the night. DIscussed case with attending prior to discharge.  Medications - No data to display  59 y.o.Jenina Seyler's evaluation in the Emergency Department is complete. It has been determined that no acute conditions requiring further emergency intervention are present at this time. The patient/guardian have been advised of the diagnosis and plan. We have discussed signs and symptoms that warrant return to the ED, such as changes or worsening in symptoms.  Vital signs are stable at discharge. Filed Vitals:   11/08/14 0000  BP: 121/66  Pulse: 68  Temp:   Resp:     Patient/guardian has voiced understanding and agreed to follow-up with the PCP or specialist.     Delos Haring, PA-C 11/14/14 [place], MD 11/14/14 1818

## 2014-11-07 NOTE — ED Notes (Signed)
Pt with CBG of 198 at this time.

## 2014-11-07 NOTE — ED Notes (Signed)
Pt is known diabetic that let her blood sugar drop. EMS was called and she was transported to the hospital. EMS reported that initial blood sugar was 46. Pt given glucagon enroute to the hospital. Upon arrival to the ER, pt's blood sugar was 54.

## 2014-11-07 NOTE — ED Notes (Signed)
Pt with CBG of 218 at this time.

## 2014-11-07 NOTE — ED Notes (Signed)
Pt CBG, 83. Nurse was notified.

## 2014-11-08 NOTE — Discharge Instructions (Signed)
Hypoglycemia °Hypoglycemia occurs when the glucose in your blood is too low. Glucose is a type of sugar that is your body's main energy source. Hormones, such as insulin and glucagon, control the level of glucose in the blood. Insulin lowers blood glucose and glucagon increases blood glucose. Having too much insulin in your blood stream, or not eating enough food containing sugar, can result in hypoglycemia. Hypoglycemia can happen to people with or without diabetes. It can develop quickly and can be a medical emergency.  °CAUSES  °· Missing or delaying meals. °· Not eating enough carbohydrates at meals. °· Taking too much diabetes medicine. °· Not timing your oral diabetes medicine or insulin doses with meals, snacks, and exercise. °· Nausea and vomiting. °· Certain medicines. °· Severe illnesses, such as hepatitis, kidney disorders, and certain eating disorders. °· Increased activity or exercise without eating something extra or adjusting medicines. °· Drinking too much alcohol. °· A nerve disorder that affects body functions like your heart rate, blood pressure, and digestion (autonomic neuropathy). °· A condition where the stomach muscles do not function properly (gastroparesis). Therefore, medicines and food may not absorb properly. °· Rarely, a tumor of the pancreas can produce too much insulin. °SYMPTOMS  °· Hunger. °· Sweating (diaphoresis). °· Change in body temperature. °· Shakiness. °· Headache. °· Anxiety. °· Lightheadedness. °· Irritability. °· Difficulty concentrating. °· Dry mouth. °· Tingling or numbness in the hands or feet. °· Restless sleep or sleep disturbances. °· Altered speech and coordination. °· Change in mental status. °· Seizures or prolonged convulsions. °· Combativeness. °· Drowsiness (lethargic). °· Weakness. °· Increased heart rate or palpitations. °· Confusion. °· Pale, gray skin color. °· Blurred or double vision. °· Fainting. °DIAGNOSIS  °A physical exam and medical history will be  performed. Your caregiver may make a diagnosis based on your symptoms. Blood tests and other lab tests may be performed to confirm a diagnosis. Once the diagnosis is made, your caregiver will see if your signs and symptoms go away once your blood glucose is raised.  °TREATMENT  °Usually, you can easily treat your hypoglycemia when you notice symptoms. °· Check your blood glucose. If it is less than 70 mg/dl, take one of the following:   °¨ 3-4 glucose tablets.   °¨ ½ cup juice.   °¨ ½ cup regular soda.   °¨ 1 cup skim milk.   °¨ ½-1 tube of glucose gel.   °¨ 5-6 hard candies.   °· Avoid high-fat drinks or food that may delay a rise in blood glucose levels. °· Do not take more than the recommended amount of sugary foods, drinks, gel, or tablets. Doing so will cause your blood glucose to go too high.   °· Wait 10-15 minutes and recheck your blood glucose. If it is still less than 70 mg/dl or below your target range, repeat treatment.   °· Eat a snack if it is more than 1 hour until your next meal.   °There may be a time when your blood glucose may go so low that you are unable to treat yourself at home when you start to notice symptoms. You may need someone to help you. You may even faint or be unable to swallow. If you cannot treat yourself, someone will need to bring you to the hospital.  °HOME CARE INSTRUCTIONS °· If you have diabetes, follow your diabetes management plan by: °¨ Taking your medicines as directed. °¨ Following your exercise plan. °¨ Following your meal plan. Do not skip meals. Eat on time. °¨ Testing your blood   glucose regularly. Check your blood glucose before and after exercise. If you exercise longer or different than usual, be sure to check blood glucose more frequently. °¨ Wearing your medical alert jewelry that says you have diabetes. °· Identify the cause of your hypoglycemia. Then, develop ways to prevent the recurrence of hypoglycemia. °· Do not take a hot bath or shower right after an  insulin shot. °· Always carry treatment with you. Glucose tablets are the easiest to carry. °· If you are going to drink alcohol, drink it only with meals. °· Tell friends or family members ways to keep you safe during a seizure. This may include removing hard or sharp objects from the area or turning you on your side. °· Maintain a healthy weight. °SEEK MEDICAL CARE IF:  °· You are having problems keeping your blood glucose in your target range. °· You are having frequent episodes of hypoglycemia. °· You feel you might be having side effects from your medicines. °· You are not sure why your blood glucose is dropping so low. °· You notice a change in vision or a new problem with your vision. °SEEK IMMEDIATE MEDICAL CARE IF:  °· Confusion develops. °· A change in mental status occurs. °· The inability to swallow develops. °· Fainting occurs. °Document Released: 03/17/2005 Document Revised: 03/22/2013 Document Reviewed: 07/14/2011 °[employer]® Patient Information ©2015 [employer]. This information is not intended to replace advice given to you by your health care provider. Make sure you discuss any questions you have with your health care provider. ° °

## 2014-11-08 NOTE — ED Notes (Signed)
Pt left with all belongings and ambulated out of treatment area.  

## 2014-11-10 ENCOUNTER — Other Ambulatory Visit: Payer: Self-pay | Admitting: Internal Medicine

## 2014-11-10 DIAGNOSIS — R921 Mammographic calcification found on diagnostic imaging of breast: Secondary | ICD-10-CM

## 2014-12-03 IMAGING — CT CT HEAD W/O CM
1 of 2 series · 13 of 30 positions shown, 17 images · non-contrast
Comparison: None.

CLINICAL DATA: Right hand numbness over the past 2 days.  Current
history of hypertension, diabetes, chronic pain syndrome, and
methadone therapy.

CT HEAD WITHOUT CONTRAST
TECHNIQUE: Contiguous axial images were obtained from the base of
the skull through the vertex without contrast.

[Series 2: brain · axial · 0.47mm/px · z∈[+128,+259]mm · 13 of 32 slices shown, 17 images]
[im 3/32  brain]
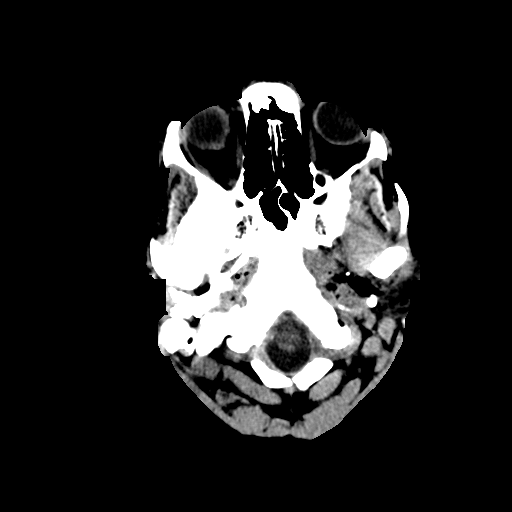
[im 3/32  bone]
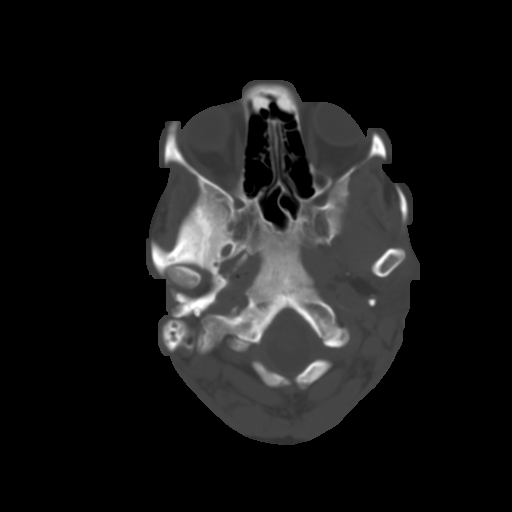
[im 5/32  brain]
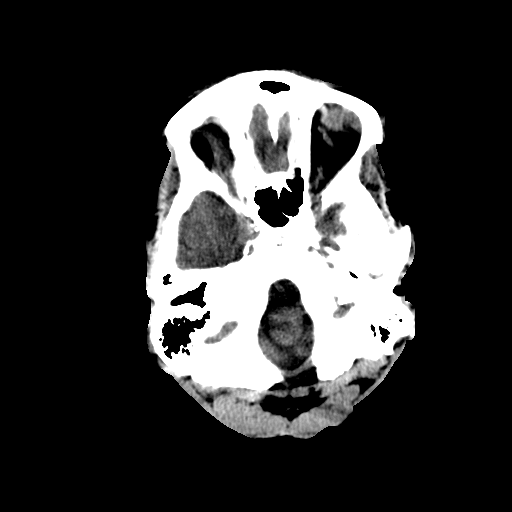
[im 7/32  brain]
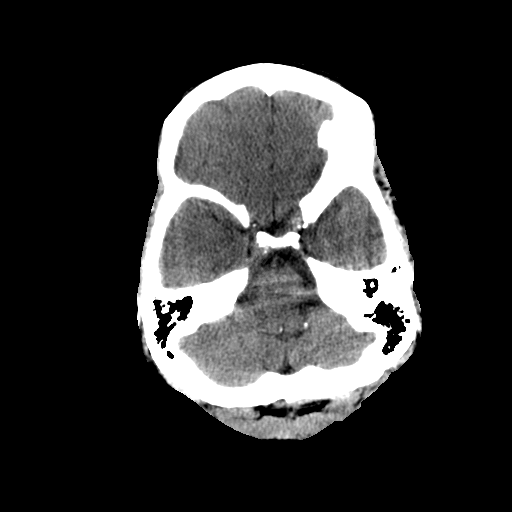
[im 9/32  brain]
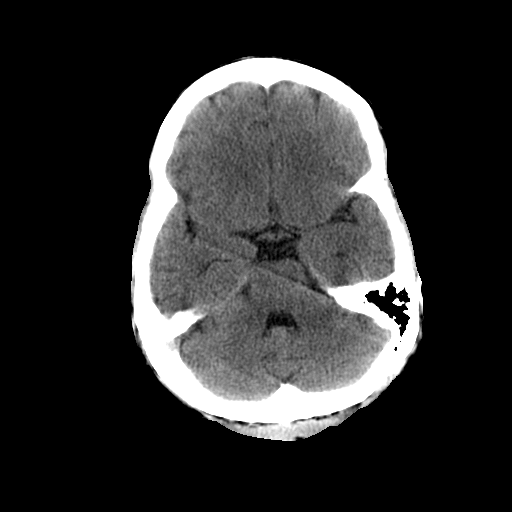
[im 12/32  brain]
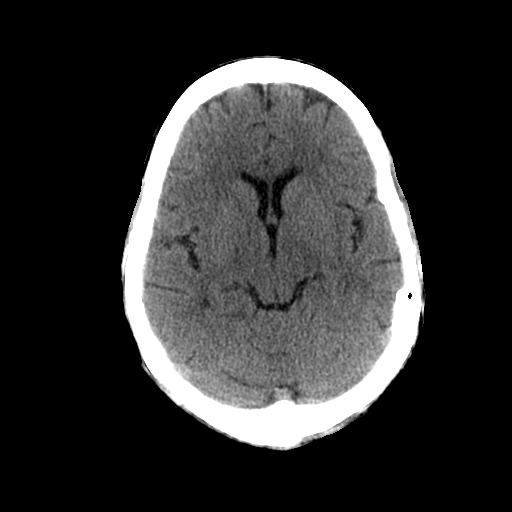
[im 12/32  bone]
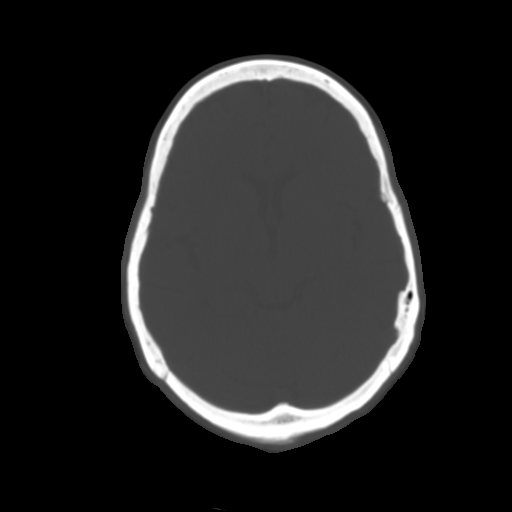
[im 14/32  brain]
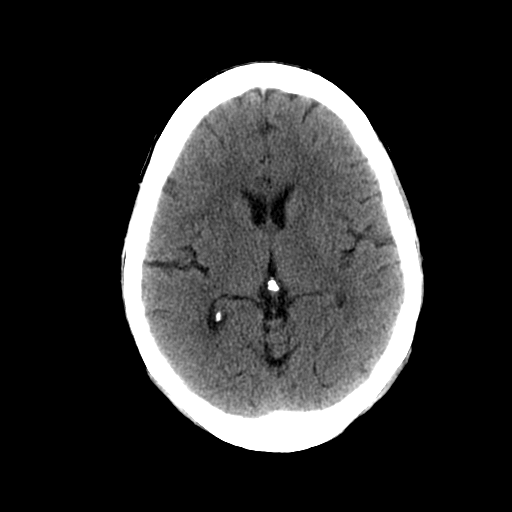
[im 16/32  brain]
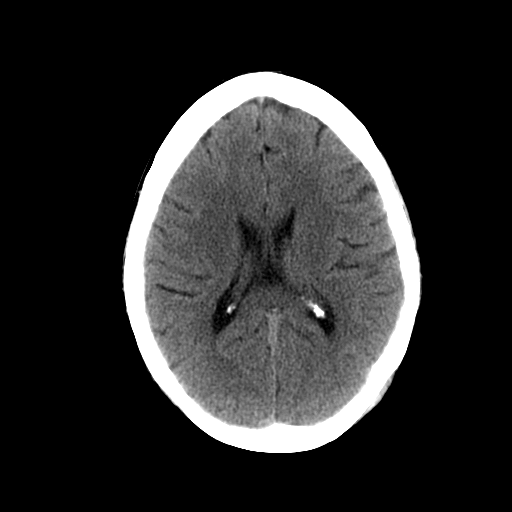
[im 18/32  brain]
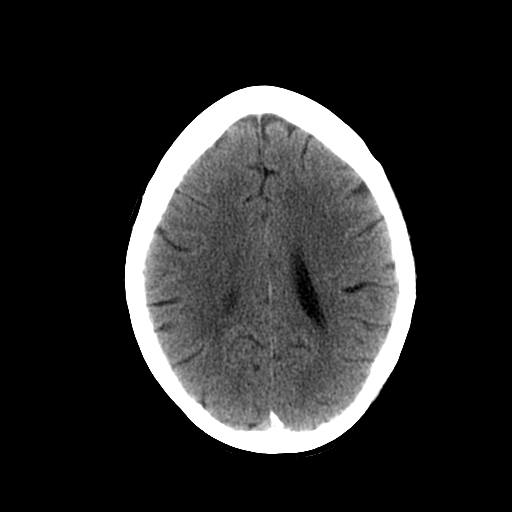
[im 20/32  brain]
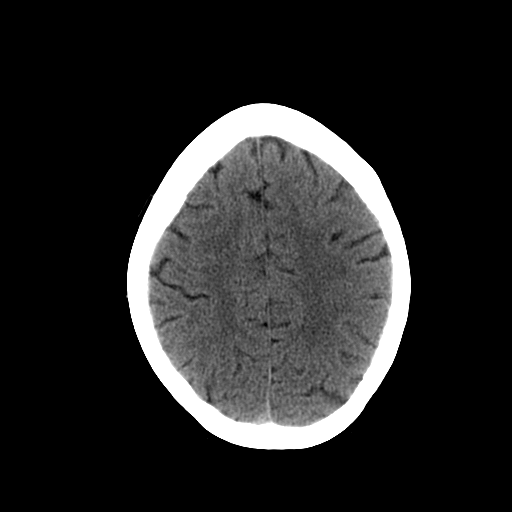
[im 20/32  bone]
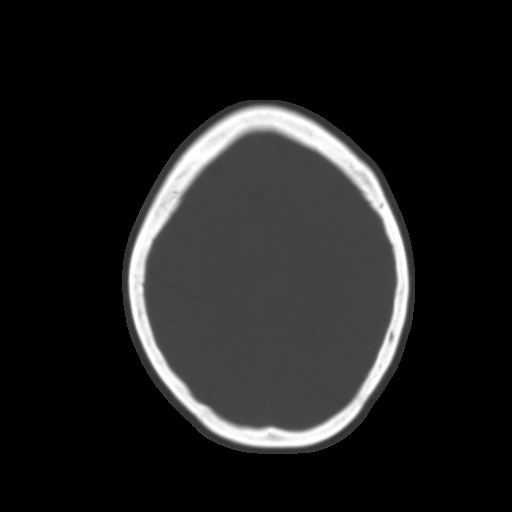
[im 23/32  brain]
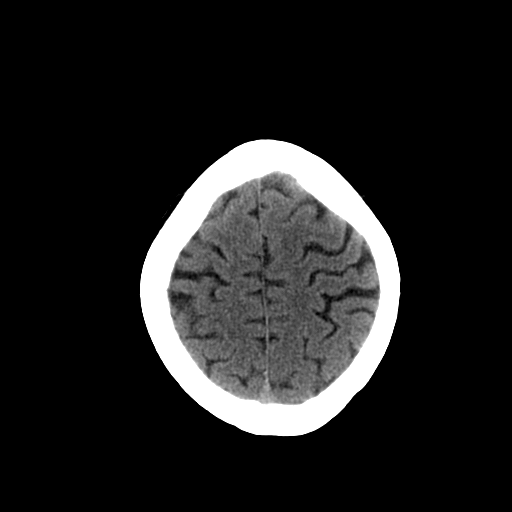
[im 25/32  brain]
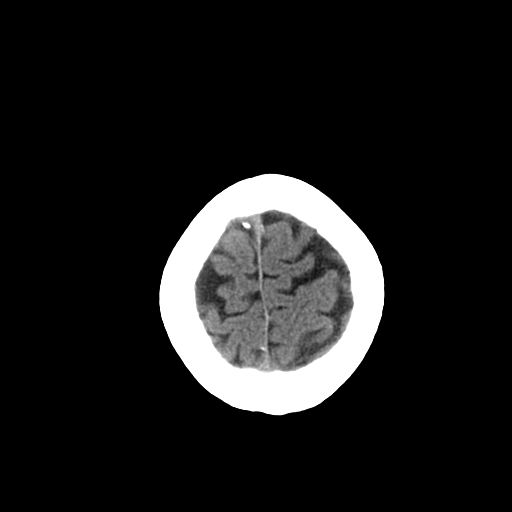
[im 27/32  brain]
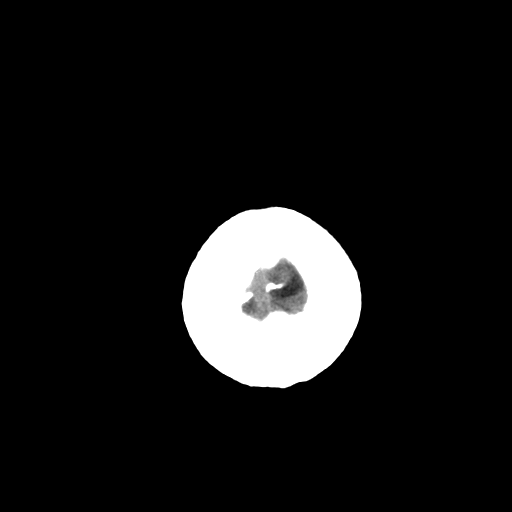
[im 29/32  brain]
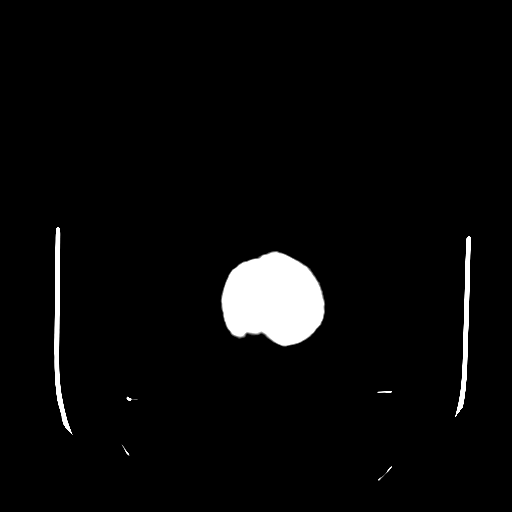
[im 29/32  bone]
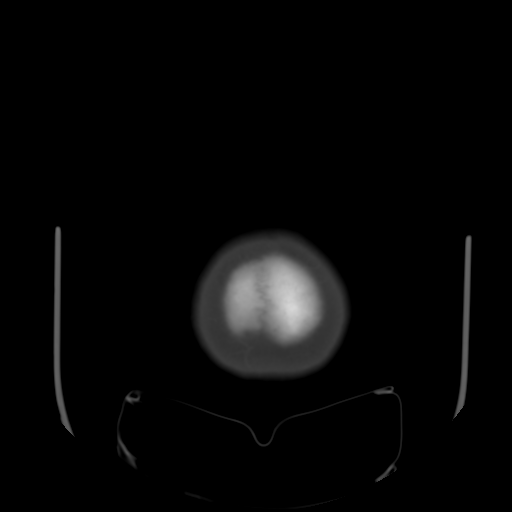

[13 of 30 positions shown; findings below may reference images not displayed]

FINDINGS: Ventricular system normal in size and appearance for age.
No mass lesion.  No midline shift.  No acute hemorrhage or
hematoma.  No extra-axial fluid collections.  No evidence of acute
infarction.

No skull fracture or other focal osseous abnormality involving the
skull.  Visualized paranasal sinuses, bilateral mastoid air cells,
and bilateral middle ear cavities well-aerated.  Mild bilateral
carotid siphon atherosclerosis.
IMPRESSION: 1.  No acute intracranial abnormality.
2.  Mild bilateral carotid siphon atherosclerosis.

## 2014-12-14 ENCOUNTER — Encounter: Payer: Self-pay | Admitting: Internal Medicine

## 2014-12-14 ENCOUNTER — Ambulatory Visit (INDEPENDENT_AMBULATORY_CARE_PROVIDER_SITE_OTHER): Payer: Medicare Other | Admitting: Internal Medicine

## 2014-12-14 VITALS — BP 148/68 | HR 73 | Temp 98.1°F | Ht 63.0 in | Wt 166.1 lb

## 2014-12-14 DIAGNOSIS — E118 Type 2 diabetes mellitus with unspecified complications: Principal | ICD-10-CM

## 2014-12-14 DIAGNOSIS — I10 Essential (primary) hypertension: Secondary | ICD-10-CM | POA: Diagnosis not present

## 2014-12-14 DIAGNOSIS — IMO0002 Reserved for concepts with insufficient information to code with codable children: Secondary | ICD-10-CM

## 2014-12-14 DIAGNOSIS — E1165 Type 2 diabetes mellitus with hyperglycemia: Secondary | ICD-10-CM

## 2014-12-14 DIAGNOSIS — Z0000 Encounter for general adult medical examination without abnormal findings: Secondary | ICD-10-CM | POA: Insufficient documentation

## 2014-12-14 DIAGNOSIS — Z794 Long term (current) use of insulin: Secondary | ICD-10-CM

## 2014-12-14 LAB — POCT GLYCOSYLATED HEMOGLOBIN (HGB A1C): Hemoglobin A1C: 5.1

## 2014-12-14 LAB — GLUCOSE, CAPILLARY: Glucose-Capillary: 142 mg/dL — ABNORMAL HIGH (ref 65–99)

## 2014-12-14 MED ORDER — HUMALOG MIX 75/25 KWIKPEN (75-25) 100 UNIT/ML ~~LOC~~ SUPN: PEN_INJECTOR | SUBCUTANEOUS | Status: DC

## 2014-12-14 NOTE — Assessment & Plan Note (Addendum)
BP Readings from Last 3 Encounters:  12/14/14 148/68  11/08/14 121/66  11/05/14 148/73    Lab Results  Component Value Date   NA 140 11/07/2014   K 3.5 11/07/2014   CREATININE 1.43* 11/07/2014    Assessment: Blood pressure control:  Slightly eleveted Comments: presently on lasix- 40mg  daily, was previously on HCTZ. She is also complaining of swelling of her feet, which she says was better when she was on HCTZ.  Plan: Medications:  Will d/c lasix for now, obtain records from PCP including ECHO, and will go back to HCTZ, will start with 12.5mg  daily, as fas as Bmet is WNL. Other plans: Bmet today. - See in 4 weeks

## 2014-12-14 NOTE — Patient Instructions (Signed)
For now do not take your meloxicam anymore. Let us do some blood work first.   Please we will also like to get some records form your former doctor.

## 2014-12-14 NOTE — Progress Notes (Signed)
Patient ID: Rose [name], female   DOB: Aug 16, 1955, 59 y.o.   MRN: [id number]   Subjective:   Patient ID: Rose [name] female   DOB: 24-Apr-1955 59 y.o.   MRN: [id number]  HPI: Rose [name] is a 59 y.o. with PMH listed below. Presented to clinic for a new patient visit. She was previously been followed by a PCP in [place]. She says her brother passed away 25 days ago, he used to drive them both there to see their PCP, now that he is now gone, she cannot take that long drive, so she decided to change her PCP. She has no complaints today. She was seen in the ED - 11/07/2014 for hypoglycemia, she was on high dose of insulin then 75/25- 30 u BID, she says she used to  A lot of low blood sugars readings. But since then she followed up with her PCP who reduced the dose of her insulin to now 15u- am, 10u- pm. She has not had any low blood sugar readings since then , this has been her current dose for a month now. At that ED visit her Cr- elevated at 1.4, this was attributed to meloxicam. Pt has still been taking the meloxicam very intermittently though since then, as she was told to stop.  She otherwise feels well.   Past Medical History  Diagnosis Date  . Diabetes mellitus   . Hypertension   . Arthritis   . History of drug abuse states quit herion in 2006  . Chronic back pain   . Frequency of urination   . Urge urinary incontinence   . Nocturia   . Edema of lower extremity history cellulitis jan 2013  . Methadone maintenance therapy patient   . Anxiety   . Depression   . History of chronic bronchitis   . Asthma   . Tendonitis    Current Outpatient Prescriptions  Medication Sig Dispense Refill  . albuterol (PROVENTIL HFA;VENTOLIN HFA) 108 (90 BASE) MCG/ACT inhaler Inhale 2 puffs into the lungs every 6 (six) hours as needed. For wheezing    . atenolol (TENORMIN) 50 MG tablet Take 50 mg by mouth daily.  3  . cetirizine (ZYRTEC) 10 MG tablet Take 10 mg by mouth daily.    .  furosemide (LASIX) 40 MG tablet Take 1 tablet (40 mg total) by mouth daily. 30 tablet 0  . HUMALOG MIX 75/25 KWIKPEN (75-25) 100 UNIT/ML Kwikpen Take 15 units in the morning and 10 units in the evening. 15 mL 0  . meloxicam (MOBIC) 15 MG tablet Take 15 mg by mouth daily.    . metFORMIN (GLUCOPHAGE) 1000 MG tablet Take 1,000 mg by mouth 2 (two) times daily with a meal.     . methadone (DOLOPHINE) 10 MG/5ML solution Take 88 mg by mouth daily.    . Multiple Vitamin (MULTIVITAMIN WITH MINERALS) TABS tablet Take 1 tablet by mouth daily.     No current facility-administered medications for this visit.   Family History  Problem Relation Age of Onset  . Hypertension Mother   . Diabetes type II Mother   . CAD Father   . Hypertension Brother   . Diabetes type II Brother   . Diabetes type II Sister    Social History   Social History  . Marital Status: Single    Spouse Name: N/A  . Number of Children: N/A  . Years of Education: N/A   Social History Main Topics  . Smoking status: Current Every Day  Smoker -- 1.00 packs/day for 20 years    Types: Cigarettes  . Smokeless tobacco: Never Used  . Alcohol Use: No  . Drug Use: No     Comment: methadone program  PER PT QUIT HEROIN 2006  ALSO HX MARIJUANA USE  . Sexual Activity: No   Other Topics Concern  . Not on file   Social History Narrative   Review of Systems: CONSTITUTIONAL- No Fever, change in appetite. SKIN- No Rash, colour changes or itching. HEAD- No Headache or dizziness. RESPIRATORY- No Cough or SOB. CARDIAC- No Palpitations, or chest pain. GI- No nausea, vomiting, diarrhoea, abd pain. URINARY- No Frequency, or dysuria. NEUROLOGIC- No Numbness, syncope, seizures or burning. [facility]- Denies depression or anxiety.  Objective:  Physical Exam: Filed Vitals:   12/14/14 1428  BP: 148/68  Pulse: 73  Temp: 98.1 F (36.7 C)  TempSrc: Oral  Height: 5\' 3"  (1.6 m)  Weight: 166 lb 1.6 oz (75.342 kg)  SpO2: 97%   GENERAL- alert,  co-operative, appears as stated age, not in any distress. HEENT- Atraumatic, normocephalic, PERRL, CARDIAC- RRR, no murmurs, rubs or gallops. RESP- clear to auscultation bilaterally, no wheezes or crackles. ABDOMEN- Soft, nontender,  NEURO- Alert and oriented. EXTREMITIES- Warm and well perfused, +1 pitting edema to mid chin. SKIN- Warm, dry, No rash or lesion. PSYCH- Normal mood and affect, appropriate thought content and speech.  Assessment & Plan:   The patient's case and plan of care was discussed with attending physician, Dr. Evette Doffing.  Please see problem based charting for assessment and plan.

## 2014-12-14 NOTE — Assessment & Plan Note (Signed)
Patient to sign release of information form. Follow up on eye exam, urine microscopy since not on Ace, foot exam, colonoscopy, PAP smear, mammogram.

## 2014-12-14 NOTE — Assessment & Plan Note (Signed)
Lab Results  Component Value Date   HGBA1C 5.1 12/14/2014   HGBA1C 10.2* 09/15/2012   HGBA1C 13.0* 02/27/2012     Assessment: Diabetes control:  Too tightly controlled today, but she had been on a more aggressive regimen previously, probably prior lows reflecting on hgab1c. Comments: regimen was switch within the past month to 15- pm , 10u am of 75/25. Pt brought her glucometer, there are a few readings that are slightly on the low side- mostly in the afternoons and few readings in the evenings. No low readings - fasting. Will scan log into epic.  Plan: Medications:  Will reduce morning dose of insulin to 13u in the morning, but continue evening read of 10u at night. Educational resources provided: brochure, handout Self management tools provided: copy of home glucose meter download Other plans: Will see in 1 month,  For glucometer log read, follow up on records and health maintenance and BP.

## 2014-12-15 LAB — BMP8+ANION GAP
Anion Gap: 19 mmol/L — ABNORMAL HIGH (ref 10.0–18.0)
BUN/Creatinine Ratio: 12 (ref 9–23)
BUN: 15 mg/dL (ref 6–24)
CO2: 26 mmol/L (ref 18–29)
Calcium: 9.4 mg/dL (ref 8.7–10.2)
Chloride: 98 mmol/L (ref 97–108)
Creatinine, Ser: 1.29 mg/dL — ABNORMAL HIGH (ref 0.57–1.00)
GFR calc Af Amer: 53 mL/min/{1.73_m2} — ABNORMAL LOW (ref >59)
GFR calc non Af Amer: 46 mL/min/{1.73_m2} — ABNORMAL LOW (ref >59)
Glucose: 107 mg/dL — ABNORMAL HIGH (ref 65–99)
Potassium: 4.2 mmol/L (ref 3.5–5.2)
Sodium: 143 mmol/L (ref 134–144)

## 2014-12-18 NOTE — Progress Notes (Signed)
Internal Medicine Clinic Attending  Case discussed with Dr. Emokpae soon after the resident saw the patient.  We reviewed the resident's history and exam and pertinent patient test results.  I agree with the assessment, diagnosis, and plan of care documented in the resident's note. 

## 2014-12-19 ENCOUNTER — Other Ambulatory Visit: Payer: Self-pay | Admitting: Internal Medicine

## 2014-12-19 MED ORDER — HYDROCHLOROTHIAZIDE 12.5 MG PO CAPS: 12.5000 mg | ORAL_CAPSULE | Freq: Every day | ORAL | Status: DC

## 2014-12-19 NOTE — Addendum Note (Signed)
Addended by: Bethena Roys on: 12/19/2014 09:54 AM   Modules accepted: Orders

## 2015-01-01 ENCOUNTER — Encounter: Payer: Medicare Other | Admitting: Internal Medicine

## 2015-01-03 ENCOUNTER — Encounter: Payer: Self-pay | Admitting: Internal Medicine

## 2015-01-25 ENCOUNTER — Encounter: Payer: Medicare Other | Admitting: Internal Medicine

## 2015-01-25 ENCOUNTER — Ambulatory Visit (INDEPENDENT_AMBULATORY_CARE_PROVIDER_SITE_OTHER): Payer: Medicare Other | Admitting: Internal Medicine

## 2015-01-25 ENCOUNTER — Encounter: Payer: Self-pay | Admitting: Internal Medicine

## 2015-01-25 VITALS — BP 126/64 | HR 72 | Temp 98.4°F | Ht 63.0 in | Wt 164.0 lb

## 2015-01-25 DIAGNOSIS — E118 Type 2 diabetes mellitus with unspecified complications: Principal | ICD-10-CM

## 2015-01-25 DIAGNOSIS — Z634 Disappearance and death of family member: Secondary | ICD-10-CM

## 2015-01-25 DIAGNOSIS — Z794 Long term (current) use of insulin: Secondary | ICD-10-CM | POA: Diagnosis not present

## 2015-01-25 DIAGNOSIS — E1165 Type 2 diabetes mellitus with hyperglycemia: Secondary | ICD-10-CM

## 2015-01-25 DIAGNOSIS — Z0000 Encounter for general adult medical examination without abnormal findings: Secondary | ICD-10-CM

## 2015-01-25 DIAGNOSIS — E119 Type 2 diabetes mellitus without complications: Secondary | ICD-10-CM | POA: Diagnosis not present

## 2015-01-25 DIAGNOSIS — I10 Essential (primary) hypertension: Secondary | ICD-10-CM

## 2015-01-25 MED ORDER — HUMALOG MIX 75/25 KWIKPEN (75-25) 100 UNIT/ML ~~LOC~~ SUPN: PEN_INJECTOR | SUBCUTANEOUS | Status: DC

## 2015-01-25 NOTE — Assessment & Plan Note (Signed)
Patient reports sadness over passing of her brother within recent months.  States she is having a difficult time with his passing as he helped her with a lot of daily needs.  Reports frequently crying.  Discussed that it is normal for her to feel sadness at loss of her brother.  Patient states she is interested in receiving counseling to help with this life change.  Plan: -given brochure for grief counseling with [facility] -continue to follow at return visit to clinic

## 2015-01-25 NOTE — Assessment & Plan Note (Signed)
Follow up on eye exam, urine microalbumin.  Needs colonoscopy, Pap smear, Mammogram.  Will continue to address at follow up visits.

## 2015-01-25 NOTE — Patient Instructions (Signed)
Thank you for bringing your medicines today. This helps [place] keep you safe from mistakes.  A refill for your insulin has been sent to your pharmacy.  Please come back to see [place] in 3 months.  We will schedule you for Pap smear at that time.

## 2015-01-25 NOTE — Assessment & Plan Note (Signed)
BP Readings from Last 3 Encounters:  01/25/15 126/64  12/14/14 148/68  11/08/14 121/66       Component Value Date/Time   NA 143 12/14/2014 1538   NA 140 11/07/2014 2306   K 4.2 12/14/2014 1538   CREATININE 1.29* 12/14/2014 1538    Assessment:  Patient with HTN compliant with 2 class (diuretic and BB) anti-hypertensive therapy who presents today with blood pressure of 126/64.  Denies headaches, chest pain, edema, dizziness or lightheadedness  Blood pressure control: good Progress towards BP goal: at goal Comments: patient states will occasionally take dose of Lasix to control swelling in her lower extremities  Plan:   -Medication changes: none  -Labs: none -Other plans: none -Encourage home BP monitoring 3 times per week or at drug store occasionally  -Encourage regular exercise and healthy diet (decreased salt intake)  Medications after today's visit: 1. HCTZ 12.5mg  daily 2. Atenolol 50mg  daily 3. Lasix 40mg  PRN

## 2015-01-25 NOTE — Progress Notes (Signed)
Patient ID: Reneta Niehaus, female   DOB: [birth year], 57 y.o.   MRN: [id number]   Subjective:   Patient ID: Takera Rayl female   DOB: May 08, 1955 59 y.o.   MRN: [id number]  HPI: Ms.Ilka Ludwick is a 59 y.o. female with past medical history as listed below presents to the clinic today for follow up of her HTN and DM.  HTN: At last visit, patient was started back on HCTZ 12.5mg  daily and discontinued from her Lasix as she had previously been on HCTZ and felt that she tolerated it better than Lasix.  She continues to take Atenolol 50mg  daily.  Today her BP is 126/64 and she reports no complaints.  States that as needed she will take a dose of her Lasix if she notices an increase in swelling of her lower extremities.  DM: Patient on metformin and insulin 13 units in AM and 10units in PM continues to do well with her DM management.  Hgb A1c was 5.1 in September 2016.  Glucometer readings from today show an average of 113 with checking her blood sugars BID.  She had 1 low of 49 and attributed it to not eating that day.  She is due for eye exam, foot exam, and urine microalbumin check.    Past Medical History  Diagnosis Date  . Diabetes mellitus   . Hypertension   . Arthritis   . History of drug abuse states quit herion in 2006  . Chronic back pain   . Frequency of urination   . Urge urinary incontinence   . Nocturia   . Edema of lower extremity history cellulitis jan 2013  . Methadone maintenance therapy patient ([place])   . Anxiety   . Depression   . History of chronic bronchitis   . Asthma   . Tendonitis    Current Outpatient Prescriptions  Medication Sig Dispense Refill  . albuterol (PROVENTIL HFA;VENTOLIN HFA) 108 (90 BASE) MCG/ACT inhaler Inhale 2 puffs into the lungs every 6 (six) hours as needed. For wheezing    . atenolol (TENORMIN) 50 MG tablet Take 50 mg by mouth daily.  3  . cetirizine (ZYRTEC) 10 MG tablet Take 10 mg by mouth daily.    [name] HUMALOG MIX 75/25 KWIKPEN (75-25)  100 UNIT/ML Kwikpen Take 15 units in the morning and 10 units in the evening. 15 mL 2  . hydrochlorothiazide (MICROZIDE) 12.5 MG capsule TAKE 1 CAPSULE(12.5 MG) BY MOUTH DAILY 90 capsule 0  . metFORMIN (GLUCOPHAGE) 1000 MG tablet Take 1,000 mg by mouth 2 (two) times daily with a meal.     . methadone (DOLOPHINE) 10 MG/5ML solution Take 88 mg by mouth daily.    . Multiple Vitamin (MULTIVITAMIN WITH MINERALS) TABS tablet Take 1 tablet by mouth daily.    . furosemide (LASIX) 40 MG tablet Take 1 tablet (40 mg total) by mouth daily. (Patient not taking: Reported on 01/25/2015) 30 tablet 0  . meloxicam (MOBIC) 15 MG tablet Take 15 mg by mouth daily.     No current facility-administered medications for this visit.   Family History  Problem Relation Age of Onset  . Hypertension Mother   . Diabetes type II Mother   . CAD Father   . Hypertension Brother   . Diabetes type II Brother   . Diabetes type II Sister    Social History   Social History  . Marital Status: Single    Spouse Name: N/A  . Number of Children: N/A  . Years of  Education: N/A   Social History Main Topics  . Smoking status: Current Every Day Smoker -- 0.80 packs/day for 20 years    Types: Cigarettes  . Smokeless tobacco: Never Used  . Alcohol Use: No  . Drug Use: No     Comment: methadone program  PER PT QUIT HEROIN 2006  ALSO HX MARIJUANA USE  . Sexual Activity: No   Other Topics Concern  . None   Social History Narrative   Review of Systems: Review of Systems  Constitutional: Negative for fever and chills.  Eyes: Negative for blurred vision.  Respiratory: Negative for cough and shortness of breath.   Cardiovascular: Positive for leg swelling. Negative for chest pain.  Gastrointestinal: Negative for nausea and vomiting.  Genitourinary: Negative for dysuria.  Musculoskeletal: Negative for back pain and falls.  Neurological: Negative for dizziness and headaches.  Psychiatric/Behavioral: Positive for depression.  Negative for suicidal ideas.    Objective:  Physical Exam: Filed Vitals:   01/25/15 1440  BP: 126/64  Pulse: 72  Temp: 98.4 F (36.9 C)  TempSrc: Oral  Height: 5\' 3"  (1.6 m)  Weight: 164 lb (74.39 kg)  SpO2: 96%   Physical Exam  Constitutional: She is oriented to person, place, and time. She appears well-developed and well-nourished.  HENT:  Head: Normocephalic and atraumatic.  Eyes: EOM are normal.  Neck: Normal range of motion.  Cardiovascular: Normal rate, regular rhythm, normal heart sounds and intact distal pulses.   Respiratory: Effort normal and breath sounds normal.  GI: Soft. Bowel sounds are normal.  Musculoskeletal: Normal range of motion.  Neurological: She is alert and oriented to person, place, and time.  Skin: Skin is warm and dry.  Psychiatric: She has a normal mood and affect. Her behavior is normal.    Assessment & Plan:   Please see Problem List for Assessment and Plan.

## 2015-01-25 NOTE — Assessment & Plan Note (Signed)
Lab Results  Component Value Date   HGBA1C 5.1 12/14/2014   HGBA1C 10.2* 09/15/2012    Assessment:  Patient with Hgb A1c of 5.1 on 12/14/2014.  Patient reports compliance with medications.  Denies any recent symptomatic hypoglycemia or hyperglycemia.  Diabetes control: tightly controlled   Progress toward Hgb A1c goal: at goal Home glucose monitoring: Yes.  Average CBGs according to meter are 113.  Frequency: BID  Timing: before meals Comments: patient had insulin titrated down at last visit to 13 units in the AM and 10 units in the PM.  However, has still been taking 15 in the AM, 10 in the PM because she forgot about the dose change.  Plan:  -Medication changes: none, reminded patient to only take 13 units in the morning  -Hgb A1c 5.1 on 9/15.  Next Hgb A1c due 6 months from 9/15 -BP 126/64 at goal <130/90, continue current meds -No lipids on file, will need to check this at follow up -Has not had eye exam, will do this at follow up with retina scanner -Last annual foot exam: done today -Last annual urine microalbumin/creatine ratio: check today -Encourage regular exercise and healthy diet  Medications after today's visit: 1. Humalog Mix 75/25 Kwikpen 13 units in AM, 10 units in PM 2. Metformin 1000mg  BID

## 2015-01-26 ENCOUNTER — Encounter: Payer: Self-pay | Admitting: Internal Medicine

## 2015-01-26 LAB — MICROALBUMIN / CREATININE URINE RATIO
Creatinine, Urine: 158.7 mg/dL
MICROALB/CREAT RATIO: 10.4 mg/g creat (ref 0.0–30.0)
Microalbumin, Urine: 16.5 ug/mL

## 2015-01-26 NOTE — Progress Notes (Signed)
Medicine attending: I personally interviewed and briefly examined this patient, and reviewed pertinent clinical laboratory and radiographic data  with resident physician Dr.Andrew Wallace on the day of the patient visit and we discussed a  management plan. 

## 2015-04-09 ENCOUNTER — Other Ambulatory Visit: Payer: Self-pay | Admitting: Internal Medicine

## 2015-04-09 DIAGNOSIS — R921 Mammographic calcification found on diagnostic imaging of breast: Secondary | ICD-10-CM

## 2015-04-16 ENCOUNTER — Ambulatory Visit
Admission: RE | Admit: 2015-04-16 | Discharge: 2015-04-16 | Disposition: A | Discharge status: Home / Self Care | Payer: Medicare Other | Source: Ambulatory Visit | Attending: Internal Medicine | Admitting: Internal Medicine

## 2015-04-16 DIAGNOSIS — R921 Mammographic calcification found on diagnostic imaging of breast: Secondary | ICD-10-CM

## 2015-04-19 ENCOUNTER — Ambulatory Visit (INDEPENDENT_AMBULATORY_CARE_PROVIDER_SITE_OTHER): Payer: Medicare Other | Admitting: Internal Medicine

## 2015-04-19 VITALS — BP 134/69 | HR 63 | Temp 98.3°F | Wt 165.5 lb

## 2015-04-19 DIAGNOSIS — Z23 Encounter for immunization: Secondary | ICD-10-CM | POA: Diagnosis not present

## 2015-04-19 DIAGNOSIS — I10 Essential (primary) hypertension: Secondary | ICD-10-CM

## 2015-04-19 DIAGNOSIS — E119 Type 2 diabetes mellitus without complications: Secondary | ICD-10-CM | POA: Diagnosis not present

## 2015-04-19 DIAGNOSIS — E1165 Type 2 diabetes mellitus with hyperglycemia: Secondary | ICD-10-CM

## 2015-04-19 DIAGNOSIS — Z794 Long term (current) use of insulin: Secondary | ICD-10-CM | POA: Diagnosis not present

## 2015-04-19 DIAGNOSIS — IMO0002 Reserved for concepts with insufficient information to code with codable children: Secondary | ICD-10-CM

## 2015-04-19 DIAGNOSIS — Z0000 Encounter for general adult medical examination without abnormal findings: Secondary | ICD-10-CM

## 2015-04-19 DIAGNOSIS — E118 Type 2 diabetes mellitus with unspecified complications: Principal | ICD-10-CM

## 2015-04-19 LAB — GLUCOSE, CAPILLARY: Glucose-Capillary: 102 mg/dL — ABNORMAL HIGH (ref 65–99)

## 2015-04-19 LAB — POCT GLYCOSYLATED HEMOGLOBIN (HGB A1C): Hemoglobin A1C: 5.2

## 2015-04-19 MED ORDER — BACLOFEN 10 MG PO TABS: 10.0000 mg | ORAL_TABLET | Freq: Two times a day (BID) | ORAL | Status: DC | PRN

## 2015-04-19 MED ORDER — ALBUTEROL SULFATE HFA 108 (90 BASE) MCG/ACT IN AERS: 2.0000 | INHALATION_SPRAY | Freq: Four times a day (QID) | RESPIRATORY_TRACT | Status: DC | PRN

## 2015-04-19 MED ORDER — HUMALOG MIX 75/25 KWIKPEN (75-25) 100 UNIT/ML ~~LOC~~ SUPN: PEN_INJECTOR | SUBCUTANEOUS | Status: DC

## 2015-04-19 NOTE — Assessment & Plan Note (Signed)
BP Readings from Last 3 Encounters:  04/19/15 134/69  01/25/15 126/64  12/14/14 148/68       Component Value Date/Time   NA 143 12/14/2014 1538   NA 140 11/07/2014 2306   K 4.2 12/14/2014 1538   CREATININE 1.29* 12/14/2014 1538    Assessment:  Patient with HTN compliant with 2 class (diuretic and BB) anti-hypertensive therapy who presents today with blood pressure of 150/67 and 134/69 on repeat.  Denies headaches, chest pain, edema, dizziness or lightheadedness  Blood pressure control: good Progress towards BP goal: at goal Comments: patient states she no longer takes Lasix as needed for swelling and denies any recent swelling.  She states that she will occasionally check her BP at home and it ranges in the [id number] systolic  Plan:   -Medication changes: none  -Labs: none -Other plans: given home BP log -Encourage home BP monitoring 3 times per week or at drug store occasionally  -Encourage regular exercise and healthy diet (decreased salt intake)  Medications after today's visit: 1. HCTZ 12.5mg  daily 2. Atenolol 50mg  daily

## 2015-04-19 NOTE — Assessment & Plan Note (Signed)
Assessment:  Patient with Hgb A1c of 5.2 today. Patient reports compliance with medications. Denies any recent symptomatic hypoglycemia or hyperglycemia.  However, she has been taking her Humalog as 14 units in the AM, 8 units in the PM.  She was prescribed to take 13units in the AM, 10 in the PM.  We discussed today possibly decreasing her insulin dose or stopping insulin all together and adding a second oral agent to treat her well-controlled diabetes.  Patient reluctant to do so at this time.  Diabetes control: tightly controlled  Progress toward Hgb A1c goal: at goal Home glucose monitoring: Yes. Average CBGs according to meter are 110. Frequency: BID Timing: before meals  Plan:  -Medication changes: none, reminded patient to only take 13 units in the morning and 10 in the evening.  Will have patient RTC in a few weeks for BP recheck and further discussion about insulin titration versus switching to second oral agent such as Glipizide  -No lipids on file, will need to check this at follow up -Has not had eye exam, referral placed today for this -Encourage regular exercise and healthy diet  Medications after today's visit: 1. Humalog Mix 75/25 Kwikpen 13 units in AM, 10 units in PM 2. Metformin 1000mg  BID

## 2015-04-19 NOTE — Progress Notes (Signed)
Patient ID: Rose [name], female   DOB: Jul 23, 1955, 60 y.o.   MRN: [id number]   Subjective:   Patient ID: Rose [name] female   DOB: [birth year] 60 y.o.   MRN: [id number]  HPI: Rose [name] is a 60 y.o. female with past medical history as detailed below who presents today for follow up of diabetes and HTN.  Please see A&P for status of patients medical conditions addressed at today's clinic visit.    Past Medical History  Diagnosis Date  . Diabetes mellitus   . Hypertension   . Arthritis   . History of drug abuse states quit herion in 2006  . Chronic back pain   . Frequency of urination   . Urge urinary incontinence   . Nocturia   . Edema of lower extremity history cellulitis jan 2013  . Methadone maintenance therapy patient ([place])   . Anxiety   . Depression   . History of chronic bronchitis   . Asthma   . Tendonitis    Current Outpatient Prescriptions  Medication Sig Dispense Refill  . albuterol (PROVENTIL HFA;VENTOLIN HFA) 108 (90 Base) MCG/ACT inhaler Inhale 2 puffs into the lungs every 6 (six) hours as needed. For wheezing 1 Inhaler 2  . atenolol (TENORMIN) 50 MG tablet Take 50 mg by mouth daily.  3  . cetirizine (ZYRTEC) 10 MG tablet Take 10 mg by mouth daily.    . hydrochlorothiazide (MICROZIDE) 12.5 MG capsule TAKE 1 CAPSULE(12.5 MG) BY MOUTH DAILY 90 capsule 0  . meloxicam (MOBIC) 15 MG tablet Take 15 mg by mouth daily.    . metFORMIN (GLUCOPHAGE) 1000 MG tablet Take 1,000 mg by mouth 2 (two) times daily with a meal.     . methadone (DOLOPHINE) 10 MG/5ML solution Take 88 mg by mouth daily.    . Multiple Vitamin (MULTIVITAMIN WITH MINERALS) TABS tablet Take 1 tablet by mouth daily.    . baclofen (LIORESAL) 10 MG tablet Take 1 tablet (10 mg total) by mouth 2 (two) times daily as needed for muscle spasms. 30 tablet 1  . HUMALOG MIX 75/25 KWIKPEN (75-25) 100 UNIT/ML Kwikpen Take 13 units in the morning and 10 units in the evening. 15 mL 2   No current  facility-administered medications for this visit.   Family History  Problem Relation Age of Onset  . Hypertension Mother   . Diabetes type II Mother   . CAD Father   . Hypertension Brother   . Diabetes type II Brother   . Diabetes type II Sister    Social History   Social History  . Marital Status: Single    Spouse Name: N/A  . Number of Children: N/A  . Years of Education: N/A   Social History Main Topics  . Smoking status: Current Every Day Smoker -- 0.80 packs/day for 20 years    Types: Cigarettes  . Smokeless tobacco: Never Used  . Alcohol Use: No  . Drug Use: No     Comment: methadone program  PER PT QUIT HEROIN 2006  ALSO HX MARIJUANA USE  . Sexual Activity: No   Other Topics Concern  . Not on file   Social History Narrative   Review of Systems: Review of Systems  Constitutional: Negative for fever and chills.  Respiratory: Negative for cough and shortness of breath.   Cardiovascular: Negative for chest pain and leg swelling.  Gastrointestinal: Negative for nausea and vomiting.  Genitourinary: Negative for dysuria.  Musculoskeletal:       Rib cage  pain    Objective:  Physical Exam: Filed Vitals:   04/19/15 1334 04/19/15 1406  BP: 150/67 134/69  Pulse: 72 63  Temp: 98.3 F (36.8 C)   TempSrc: Oral   Weight: 165 lb 8 oz (75.07 kg)   SpO2: 98%    Physical Exam  Constitutional: She is oriented to person, place, and time. She appears well-developed and well-nourished.  HENT:  Head: Normocephalic and atraumatic.  Eyes: EOM are normal.  Neck: Normal range of motion.  Cardiovascular: Normal rate and regular rhythm.   Pulmonary/Chest: Effort normal and breath sounds normal.  Musculoskeletal: Normal range of motion.  Neurological: She is alert and oriented to person, place, and time.  Psychiatric: She has a normal mood and affect.    Assessment & Plan:   Please see problem list for assessment and plan.  Case discussed with Dr. Evette Doffing.

## 2015-04-19 NOTE — Patient Instructions (Signed)
Thank you for coming to see me today. It was a pleasure. Today we talked about:   Your Diabetes: you are doing a great job with this.  You should be taking 13 units in the morning and 10 units in the evening.  I think in the future we can continue to think about coming down or stopping your insulin and switching to another oral medication.  Your High Blood Pressure: It is a little elevated today.  We want to recheck this again in a few weeks.   Please follow-up with me in 4 weeks.  If you have any questions or concerns, please do not hesitate to call the office at [phone].  Take Care,   Jule Ser, DO

## 2015-04-19 NOTE — Assessment & Plan Note (Signed)
Assessment: Patient due for colonoscopy, Pap smear, eye exam.  Plan: - had mammogram done earlier this month with recommendation to repeat in 1 year - referral for eye exam done today - stool cards given today for annual colon cancer screen

## 2015-04-20 NOTE — Progress Notes (Signed)
Internal Medicine Clinic Attending  Case discussed with Dr. Wallace at the time of the visit.  We reviewed the resident's history and exam and pertinent patient test results.  I agree with the assessment, diagnosis, and plan of care documented in the resident's note.  

## 2015-05-02 DIAGNOSIS — F1120 Opioid dependence, uncomplicated: Secondary | ICD-10-CM | POA: Diagnosis not present

## 2015-05-14 ENCOUNTER — Other Ambulatory Visit: Payer: Self-pay | Admitting: *Deleted

## 2015-05-15 MED ORDER — MELOXICAM 15 MG PO TABS: 15.0000 mg | ORAL_TABLET | Freq: Every day | ORAL | Status: DC

## 2015-05-15 MED ORDER — CETIRIZINE HCL 10 MG PO TABS: 10.0000 mg | ORAL_TABLET | Freq: Every day | ORAL | Status: DC

## 2015-05-16 ENCOUNTER — Other Ambulatory Visit: Payer: Self-pay

## 2015-05-16 MED ORDER — BACLOFEN 10 MG PO TABS
10.0000 mg | ORAL_TABLET | Freq: Two times a day (BID) | ORAL | Status: DC | PRN
Start: 2015-05-16 — End: 2015-10-16

## 2015-05-16 NOTE — Telephone Encounter (Signed)
Has not been addressed by PCP.  Please make sure she has an appointment to see PCP prior to further refills.   Rx X 1 without refills.

## 2015-05-16 NOTE — Telephone Encounter (Signed)
Please disregard, I just realized pharmacy should have. Speaking with them now

## 2015-05-24 DIAGNOSIS — H25013 Cortical age-related cataract, bilateral: Secondary | ICD-10-CM | POA: Diagnosis not present

## 2015-05-24 DIAGNOSIS — E113292 Type 2 diabetes mellitus with mild nonproliferative diabetic retinopathy without macular edema, left eye: Secondary | ICD-10-CM | POA: Diagnosis not present

## 2015-05-24 DIAGNOSIS — H35033 Hypertensive retinopathy, bilateral: Secondary | ICD-10-CM | POA: Diagnosis not present

## 2015-05-24 DIAGNOSIS — E119 Type 2 diabetes mellitus without complications: Secondary | ICD-10-CM | POA: Diagnosis not present

## 2015-05-24 LAB — HM DIABETES EYE EXAM

## 2015-05-25 LAB — HM DIABETES EYE EXAM

## 2015-05-28 ENCOUNTER — Encounter: Payer: Self-pay | Admitting: *Deleted

## 2015-05-31 ENCOUNTER — Ambulatory Visit (INDEPENDENT_AMBULATORY_CARE_PROVIDER_SITE_OTHER): Payer: Medicare Other | Admitting: Internal Medicine

## 2015-05-31 VITALS — BP 110/70 | HR 60 | Temp 98.0°F | Ht 63.0 in | Wt 166.8 lb

## 2015-05-31 DIAGNOSIS — Z0000 Encounter for general adult medical examination without abnormal findings: Secondary | ICD-10-CM

## 2015-05-31 DIAGNOSIS — Z9181 History of falling: Secondary | ICD-10-CM

## 2015-05-31 DIAGNOSIS — I10 Essential (primary) hypertension: Secondary | ICD-10-CM | POA: Diagnosis not present

## 2015-05-31 DIAGNOSIS — Z794 Long term (current) use of insulin: Secondary | ICD-10-CM

## 2015-05-31 DIAGNOSIS — E118 Type 2 diabetes mellitus with unspecified complications: Secondary | ICD-10-CM

## 2015-05-31 DIAGNOSIS — E119 Type 2 diabetes mellitus without complications: Secondary | ICD-10-CM | POA: Diagnosis not present

## 2015-05-31 DIAGNOSIS — R296 Repeated falls: Secondary | ICD-10-CM | POA: Diagnosis not present

## 2015-05-31 DIAGNOSIS — W19XXXA Unspecified fall, initial encounter: Secondary | ICD-10-CM

## 2015-05-31 DIAGNOSIS — Z7984 Long term (current) use of oral hypoglycemic drugs: Secondary | ICD-10-CM

## 2015-05-31 DIAGNOSIS — IMO0002 Reserved for concepts with insufficient information to code with codable children: Secondary | ICD-10-CM

## 2015-05-31 DIAGNOSIS — E2839 Other primary ovarian failure: Secondary | ICD-10-CM

## 2015-05-31 DIAGNOSIS — E1165 Type 2 diabetes mellitus with hyperglycemia: Secondary | ICD-10-CM

## 2015-05-31 MED ORDER — ATENOLOL 50 MG PO TABS: 50.0000 mg | ORAL_TABLET | Freq: Every day | ORAL | Status: DC

## 2015-05-31 NOTE — Progress Notes (Signed)
Patient ID: Rose [name], female   DOB: 1955-10-23, 60 y.o.   MRN: [id number]   Subjective:   Patient ID: Rose [name] female   DOB: 03-10-1956 60 y.o.   MRN: [id number]  HPI: [name] is a 60 y.o. woman with past medical history as detailed below who presents today for follow up of diabetes and hypertension.  She also complains today of falling x 2 in the past couple months.  Please see A&P for status of patients medical conditions addressed at today's visit.    Past Medical History  Diagnosis Date  . Diabetes mellitus   . Hypertension   . Arthritis   . History of drug abuse states quit herion in 2006  . Chronic back pain   . Frequency of urination   . Urge urinary incontinence   . Nocturia   . Edema of lower extremity history cellulitis jan 2013  . Methadone maintenance therapy patient ([place])   . Anxiety   . Depression   . History of chronic bronchitis   . Asthma   . Tendonitis    Current Outpatient Prescriptions  Medication Sig Dispense Refill  . albuterol (PROVENTIL HFA;VENTOLIN HFA) 108 (90 Base) MCG/ACT inhaler Inhale 2 puffs into the lungs every 6 (six) hours as needed. For wheezing 1 Inhaler 2  . atenolol (TENORMIN) 50 MG tablet Take 50 mg by mouth daily.  3  . baclofen (LIORESAL) 10 MG tablet Take 1 tablet (10 mg total) by mouth 2 (two) times daily as needed for muscle spasms. 30 tablet 0  . cetirizine (ZYRTEC) 10 MG tablet Take 1 tablet (10 mg total) by mouth daily. 30 tablet 5  . HUMALOG MIX 75/25 KWIKPEN (75-25) 100 UNIT/ML Kwikpen Take 13 units in the morning and 10 units in the evening. 15 mL 2  . hydrochlorothiazide (MICROZIDE) 12.5 MG capsule TAKE 1 CAPSULE(12.5 MG) BY MOUTH DAILY 90 capsule 0  . meloxicam (MOBIC) 15 MG tablet Take 1 tablet (15 mg total) by mouth daily. 30 tablet 3  . metFORMIN (GLUCOPHAGE) 1000 MG tablet Take 1,000 mg by mouth 2 (two) times daily with a meal.     . methadone (DOLOPHINE) 10 MG/5ML solution Take 88 mg by mouth daily.     . Multiple Vitamin (MULTIVITAMIN WITH MINERALS) TABS tablet Take 1 tablet by mouth daily.     No current facility-administered medications for this visit.   Family History  Problem Relation Age of Onset  . Hypertension Mother   . Diabetes type II Mother   . CAD Father   . Hypertension Brother   . Diabetes type II Brother   . Diabetes type II Sister    Social History   Social History  . Marital Status: Single    Spouse Name: N/A  . Number of Children: N/A  . Years of Education: N/A   Social History Main Topics  . Smoking status: Current Every Day Smoker -- 0.80 packs/day for 20 years    Types: Cigarettes  . Smokeless tobacco: Never Used  . Alcohol Use: No  . Drug Use: No     Comment: methadone program  PER PT QUIT HEROIN 2006  ALSO HX MARIJUANA USE  . Sexual Activity: No   Other Topics Concern  . Not on file   Social History Narrative   Review of Systems: Review of Systems  Constitutional: Negative for fever and chills.  Eyes: Negative for blurred vision.  Respiratory: Negative for cough and shortness of breath.   Cardiovascular: Negative for  chest pain.  Gastrointestinal: Negative for diarrhea and constipation.  Musculoskeletal: Positive for falls.  Neurological: Negative for dizziness and loss of consciousness.    Objective:  Physical Exam: Filed Vitals:   05/31/15 1331 05/31/15 1414  BP: 165/69 110/70  Pulse: 60 60  Temp: 98 F (36.7 C)   TempSrc: Oral   Height: 5\' 3"  (1.6 m)   Weight: 166 lb 12.8 oz (75.66 kg)   SpO2: 100%    Physical Exam  Constitutional: She appears well-developed and well-nourished.  HENT:  Head: Normocephalic and atraumatic.  Neck: Normal range of motion.  Cardiovascular: Normal rate and regular rhythm.   Pulmonary/Chest: Effort normal and breath sounds normal.  Neurological: She is alert.  Psychiatric: She has a normal mood and affect.    Assessment & Plan:   Please see problem list for assessment and plan.  Case  discussed with Dr. Evette Doffing.

## 2015-05-31 NOTE — Assessment & Plan Note (Signed)
Assessment/Plan: - stool cards given last office visit.  She says she messed those up and needs another set of cards.  Those were given. - Pap smear: she is due for this and stated she plans to attend free cervical cancer screening at [facility] on March 6.

## 2015-05-31 NOTE — Patient Instructions (Signed)
Thank you for coming to see me today. It was a pleasure. Today we talked about:   Your blood pressure: i have refilled your prescription for atenolol.  Please continue keeping track of your blood pressure 3-4 times per week and write down the numbers to bring to your follow up appointments.  Your diabetes: you are doing a great job with this.  You'll need another eye exam in about 9 months.  For your medications, continue taking metformin and insulin as prescribed.  For your falls: please try to decrease some of your medications that may be sedating such as baclofen and zyrtec as these can contribute to falls.  I also will refer you to physical therapy to determine which type of cane is best for you.  I want you to get a DEXA scan as well to assess your bone health.  Please follow-up with me in 6 months.  If you have any questions or concerns, please do not hesitate to call the office at [phone].  Take Care,   Jule Ser, DO

## 2015-05-31 NOTE — Assessment & Plan Note (Addendum)
Assessment: Patient reports that she has fallen 2 times in past couple months.  States she loses her balance or gets tripped up and goes down.  She denies any loss of consciousness or syncopal events.  Has not hit her head.  She is on some sedating medications (baclofen, zyrtec, methadone) that could contribute to falls.  She reports that she also used to walk with a cane but lost it sometime after her last hospitalization.  Plan: - refer to PT for gait training - recommend attempting to decrease her sedating medications - order DEXA scan as her FRAX risk for major osteoporotic fracture is calculated at near-equivalent of 60 year old female

## 2015-05-31 NOTE — Assessment & Plan Note (Signed)
Assessment: Pt with HTN compliant with diuretic and beta blocker anti-hypertensive therapy who presents with initial BP of 165/69 initially but 110/70 on re-check.  She states that she will check her BP at home and it is usually in the [id number] systolic.  Needs refill of atenolol.  Plan: - continue current regimen of HCTZ 12.5mg  daily and atenolol 50mg  daily - refill atenolol - check BMET at next visit

## 2015-05-31 NOTE — Assessment & Plan Note (Signed)
Assessment:  Pt with Hgb A1c of 5.2 in January 2017.  Reports compliance with metformin and Humalog 75/25.  She is supposed to be on 13 units in the AM and 8 units in the PM.  At last visit she was hesitant to come off insulin and remains so today.  Plan: - continue metformin 1000mg  BID - continue Humalog 75/25 BID 13 units AM and 8 units PM - follow up in 6 months - eye exam done last month, needs repeat exam in 9 months based on ophthamology recs

## 2015-06-01 DIAGNOSIS — F1120 Opioid dependence, uncomplicated: Secondary | ICD-10-CM | POA: Diagnosis not present

## 2015-06-01 NOTE — Progress Notes (Signed)
Internal Medicine Clinic Attending  Case discussed with Dr. Wallace at the time of the visit.  We reviewed the resident's history and exam and pertinent patient test results.  I agree with the assessment, diagnosis, and plan of care documented in the resident's note.  

## 2015-06-11 ENCOUNTER — Encounter: Payer: Self-pay | Admitting: *Deleted

## 2015-06-19 ENCOUNTER — Ambulatory Visit: Payer: Medicare Other | Admitting: Physical Therapy

## 2015-06-21 ENCOUNTER — Ambulatory Visit: Payer: Medicare Other | Attending: Internal Medicine | Admitting: Physical Therapy

## 2015-06-21 ENCOUNTER — Encounter: Payer: Self-pay | Admitting: Physical Therapy

## 2015-06-21 DIAGNOSIS — R269 Unspecified abnormalities of gait and mobility: Secondary | ICD-10-CM | POA: Diagnosis not present

## 2015-06-21 DIAGNOSIS — R29898 Other symptoms and signs involving the musculoskeletal system: Secondary | ICD-10-CM | POA: Diagnosis not present

## 2015-06-21 DIAGNOSIS — R2681 Unsteadiness on feet: Secondary | ICD-10-CM

## 2015-06-21 NOTE — Patient Instructions (Signed)
SINGLE LIMB STANCE   Stance: single leg on floor. Raise leg. Hold _10__ seconds. Repeat with other leg. 2___ reps per set, __2_ sets per day, _5__ days per week  Copyright  VHI. All rights reserved.  Tandem Stance   Right foot in front of left, heel touching toe both feet "straight ahead". Stand on Foot Triangle of Support with both feet. Balance in this position __30_ seconds. Do with left foot in front of right.  Copyright  VHI. All rights reserved.   

## 2015-06-21 NOTE — Therapy (Signed)
[facility] [address] [place], [place], [address] Phone: [phone]   Fax:  [phone]  Physical Therapy Evaluation  Patient Details  Name: Rose [name] MRN: [id number] Date of Birth: 02/11/1956 Referring Provider: Jule Ser, DO  Encounter Date: 06/21/2015      PT End of Session - 06/21/15 1215    Visit Number 1   Number of Visits 5  4 visits following initial eval   Date for PT Re-Evaluation 07/29/15   Authorization Type [employer]   Authorization Time Period 06-21-15 - 08-20-15   PT Start Time 1102   PT Stop Time 1150   PT Time Calculation (min) 48 min      Past Medical History  Diagnosis Date  . Diabetes mellitus   . Hypertension   . Arthritis   . History of drug abuse states quit herion in 2006  . Chronic back pain   . Frequency of urination   . Urge urinary incontinence   . Nocturia   . Edema of lower extremity history cellulitis jan 2013  . Methadone maintenance therapy patient ([place])   . Anxiety   . Depression   . History of chronic bronchitis   . Asthma   . Tendonitis     Past Surgical History  Procedure Laterality Date  . Appendectomy  1972  . Interstim implant placement  10/23/2011    Procedure: INTERSTIM IMPLANT FIRST STAGE;  Surgeon: Reece Packer, MD;  Location: [facility];  Service: Urology;  Laterality: N/A;  . Interstim implant placement  10/23/2011    Procedure: INTERSTIM IMPLANT SECOND STAGE;  Surgeon: Reece Packer, MD;  Location: [facility];  Service: Urology;  Laterality: N/A;    There were no vitals filed for this visit.  Visit Diagnosis:  Abnormality of gait - Plan: PT plan of care cert/re-cert  Unsteadiness - Plan: PT plan of care cert/re-cert  Weakness of lower extremity, unspecified laterality - Plan: PT plan of care cert/re-cert      Subjective Assessment - 06/21/15 1112    Subjective Pt reports balance problem  started about 6 months ago - states her brother suggested to her that she use a cane for assistance with balance and she obtained one and has been using it since; uses cane in home some, always uses cane outside   Patient Stated Goals to become more steady and not have to use the cane all the time   Currently in Pain? No/denies            [facility] PT Assessment - 06/21/15 1116    Assessment   Medical Diagnosis Falls:  Gait abnormality   Referring Provider Jule Ser, DO   Onset Date/Surgical Date --  August 2016   Precautions   Precautions Fall   Balance Screen   Has the patient fallen in the past 6 months Yes   How many times? 2   Has the patient had a decrease in activity level because of a fear of falling?  Yes   Is the patient reluctant to leave their home because of a fear of falling?  No   Home Environment   Living Environment Private residence   Type of [place] Access Level entry   [place] - single point   Prior Function   Level of Independence Independent with basic ADLs;Independent with household mobility with device;Independent with community mobility with device  Sensation   Light Touch --  pt reports some numbness/tingling in L foot only   Ambulation/Gait   Ambulation/Gait Yes   Ambulation/Gait Assistance 6: Modified independent (Device/Increase time)   Ambulation Distance (Feet) 120 Feet   Assistive device Straight cane   Gait Pattern Within Functional Limits  RLE appears shorter than LLE   Ambulation Surface Level;Indoor   Gait velocity 3.45  9.50 secs   Berg Balance Test   Sit to Stand Able to stand without using hands and stabilize independently   Standing Unsupported Able to stand safely 2 minutes   Sitting with Back Unsupported but Feet Supported on Floor or Stool Able to sit safely and securely 2 minutes   Stand to Sit Sits safely with minimal use of hands   Transfers Able to transfer safely, minor  use of hands   Standing Unsupported with Eyes Closed Able to stand 10 seconds with supervision   Standing Ubsupported with Feet Together Able to place feet together independently and stand 1 minute safely   From Standing, Reach Forward with Outstretched Arm Can reach confidently >25 cm (10")   From Standing Position, Pick up Object from Floor Able to pick up shoe safely and easily   From Standing Position, Turn to Look Behind Over each Shoulder Looks behind from both sides and weight shifts well   Turn 360 Degrees Able to turn 360 degrees safely but slowly  4.97 to R;  5.94 to L side   Standing Unsupported, Alternately Place Feet on Step/Stool Able to stand independently and safely and complete 8 steps in 20 seconds   Standing Unsupported, One Foot in Front Able to plae foot ahead of the other independently and hold 30 seconds  tandem 21.13: 30 secs with L foot in front   Standing on One Leg Able to lift leg independently and hold equal to or more than 3 seconds   Total Score 50   Timed Up and Go Test   Normal TUG (seconds) 9.73                   Self care - see below for pt instruction in HEP        PT Education - 06/21/15 1213    Education provided Yes   Education Details tandem and SLS   Person(s) Educated Patient   Methods Explanation;Demonstration;Handout   Comprehension Verbalized understanding;Returned demonstration             PT Long Term Goals - 06/21/15 1225    PT LONG TERM GOAL #1   Title Incr. gait velocity to >/= 3.8 ft/sec without use of cane.  (07-29-15)   Baseline 3.45 ft/sec    Time 4   Period Weeks   Status New   PT LONG TERM GOAL #2   Title Incr. Berg score to >/= 52/56 to reduce fall risk.  (07-29-15)   Baseline 50/56   Time 4   Period Weeks   Status New   PT LONG TERM GOAL #3   Title Assess R LLD and provide heel lift if indicated.  (07-29-15)   Time 4   Period Weeks   Status New   PT LONG TERM GOAL #4   Title Independent in HEP  for balance exercises.  (07-29-15)   Time 4   Period Weeks   Status New               Plan - 06/21/15 1216    Clinical Impression Statement Pt is  a 60 year old lady with history of falls and gait problems that she states started about 6 months ago.  Pt presents with gait deviaitons with RLE appearing to be shorter than LLE (? of LLD), high level gait and balance deficits.  Co-morbidities include DM (uncontrolled), HTN, chronic back pain, arthritis, anxiety/depression, tendonitis in RUE with pt wearing a splint, and bereavement due to life event with pt reporting her brother died in 11/08/2014 and her niece died 3 months later.  Status is evolving with moderate decision making required.                                                                                                                                                         Pt will benefit from skilled therapeutic intervention in order to improve on the following deficits Abnormal gait;Decreased activity tolerance;Decreased balance;Decreased mobility;Decreased strength;Decreased knowledge of use of DME;Decreased coordination   Rehab Potential Good   PT Frequency 1x / week   PT Duration 4 weeks   PT Treatment/Interventions ADLs/Self Care Home Management;Therapeutic exercise;Therapeutic activities;Functional mobility training;Stair training;Gait training;DME Instruction;Balance training;Neuromuscular re-education;Patient/family education   PT Next Visit Plan check for R LLD; check HEP and add balance exercises; do DGI   PT Home Exercise Plan balance   Consulted and Agree with Plan of Care Patient          G-Codes - 29-Jun-2015 07-09-1227    Functional Assessment Tool Used Merrilee Jansky score 50/56:  TUG score 9.73 secs:  Gait velocity 3.45 ft/sec; pt using cane for asst. with community ambulation   Functional Limitation Mobility: Walking and moving around   Mobility: Walking and Moving Around Current Status [phone]) At least 40 percent but less  than 60 percent impaired, limited or restricted   Mobility: Walking and Moving Around Goal Status [phone]) At least 20 percent but less than 40 percent impaired, limited or restricted       Problem List Patient Active Problem List   Diagnosis Date Noted  . Falls 05/31/2015  . Bereavement due to life event 01/25/2015  . Preventative health care 12/14/2014  . Diabetes mellitus type 2 with complications, uncontrolled ([place]) 02/27/2012  . HTN (hypertension) 02/27/2012    Rose [name], [place], PT 2015/06/29, 12:34 PM  [facility] [address] [place], [place], [address] Phone: [phone]   Fax:  [phone]  Name: Rose [name] MRN: [id number] Date of Birth: 01/12/1956

## 2015-06-23 ENCOUNTER — Other Ambulatory Visit: Payer: Self-pay | Admitting: Internal Medicine

## 2015-07-02 ENCOUNTER — Ambulatory Visit: Payer: Medicare Other | Admitting: Physical Therapy

## 2015-07-06 DIAGNOSIS — F1120 Opioid dependence, uncomplicated: Secondary | ICD-10-CM | POA: Diagnosis not present

## 2015-07-11 DIAGNOSIS — F1120 Opioid dependence, uncomplicated: Secondary | ICD-10-CM | POA: Diagnosis not present

## 2015-07-16 ENCOUNTER — Ambulatory Visit: Payer: Medicare Other | Admitting: Physical Therapy

## 2015-07-23 ENCOUNTER — Ambulatory Visit: Payer: Medicare Other | Attending: Internal Medicine | Admitting: Physical Therapy

## 2015-07-30 ENCOUNTER — Ambulatory Visit: Payer: Medicare Other | Attending: Internal Medicine | Admitting: Physical Therapy

## 2015-08-03 DIAGNOSIS — F1120 Opioid dependence, uncomplicated: Secondary | ICD-10-CM | POA: Diagnosis not present

## 2015-08-22 ENCOUNTER — Other Ambulatory Visit: Payer: Self-pay | Admitting: Internal Medicine

## 2015-09-14 DIAGNOSIS — F1120 Opioid dependence, uncomplicated: Secondary | ICD-10-CM | POA: Diagnosis not present

## 2015-09-15 ENCOUNTER — Other Ambulatory Visit: Payer: Self-pay | Admitting: Internal Medicine

## 2015-09-25 ENCOUNTER — Other Ambulatory Visit: Payer: Self-pay | Admitting: Internal Medicine

## 2015-09-25 NOTE — Telephone Encounter (Signed)
hydrochlorothiazide (MICROZIDE) 12.5 MG capsule [facility]

## 2015-09-26 NOTE — Telephone Encounter (Signed)
Script sent 3/28, called [facility], gave verbal script, called daughter, she will pick up today

## 2015-09-26 NOTE — Telephone Encounter (Signed)
Pt daughter requesting hydrochlorothiazide to be filled @ [facility] on [address]. Pt daughter states mother is out of the medicine for 6 days. Please call back.

## 2015-10-16 ENCOUNTER — Other Ambulatory Visit: Payer: Self-pay | Admitting: Internal Medicine

## 2015-10-19 ENCOUNTER — Other Ambulatory Visit: Payer: Self-pay | Admitting: *Deleted

## 2015-10-19 DIAGNOSIS — F1120 Opioid dependence, uncomplicated: Secondary | ICD-10-CM | POA: Diagnosis not present

## 2015-10-19 MED ORDER — BACLOFEN 10 MG PO TABS: 10.0000 mg | ORAL_TABLET | Freq: Two times a day (BID) | ORAL | Status: DC | PRN

## 2015-10-19 NOTE — Telephone Encounter (Signed)
Requesting 90 day supply. Thanks 

## 2015-10-29 DIAGNOSIS — F1120 Opioid dependence, uncomplicated: Secondary | ICD-10-CM | POA: Diagnosis not present

## 2015-11-09 DIAGNOSIS — F1120 Opioid dependence, uncomplicated: Secondary | ICD-10-CM | POA: Diagnosis not present

## 2015-11-15 ENCOUNTER — Other Ambulatory Visit: Payer: Self-pay | Admitting: *Deleted

## 2015-11-15 MED ORDER — CETIRIZINE HCL 10 MG PO TABS: 10.0000 mg | ORAL_TABLET | Freq: Every day | ORAL | 5 refills | Status: DC

## 2015-11-20 ENCOUNTER — Other Ambulatory Visit: Payer: Self-pay

## 2015-11-20 NOTE — Telephone Encounter (Signed)
Requesting meloxicam to be filled @ [facility] on [address].

## 2015-11-21 MED ORDER — MELOXICAM 15 MG PO TABS: 15.0000 mg | ORAL_TABLET | Freq: Every day | ORAL | 2 refills | Status: DC

## 2015-11-27 ENCOUNTER — Encounter: Payer: Self-pay | Admitting: *Deleted

## 2015-12-06 ENCOUNTER — Encounter: Payer: Medicare Other | Admitting: Internal Medicine

## 2015-12-12 ENCOUNTER — Encounter: Payer: Self-pay | Admitting: Internal Medicine

## 2015-12-12 ENCOUNTER — Other Ambulatory Visit: Payer: Self-pay | Admitting: Internal Medicine

## 2015-12-12 ENCOUNTER — Ambulatory Visit (INDEPENDENT_AMBULATORY_CARE_PROVIDER_SITE_OTHER): Payer: Medicare Other | Admitting: Internal Medicine

## 2015-12-12 VITALS — BP 166/67 | HR 69 | Temp 98.4°F | Ht 63.0 in | Wt 165.4 lb

## 2015-12-12 DIAGNOSIS — I129 Hypertensive chronic kidney disease with stage 1 through stage 4 chronic kidney disease, or unspecified chronic kidney disease: Secondary | ICD-10-CM

## 2015-12-12 DIAGNOSIS — E1122 Type 2 diabetes mellitus with diabetic chronic kidney disease: Secondary | ICD-10-CM | POA: Diagnosis not present

## 2015-12-12 DIAGNOSIS — E118 Type 2 diabetes mellitus with unspecified complications: Secondary | ICD-10-CM

## 2015-12-12 DIAGNOSIS — M549 Dorsalgia, unspecified: Secondary | ICD-10-CM

## 2015-12-12 DIAGNOSIS — E784 Other hyperlipidemia: Secondary | ICD-10-CM | POA: Diagnosis not present

## 2015-12-12 DIAGNOSIS — E1169 Type 2 diabetes mellitus with other specified complication: Secondary | ICD-10-CM

## 2015-12-12 DIAGNOSIS — I10 Essential (primary) hypertension: Secondary | ICD-10-CM

## 2015-12-12 DIAGNOSIS — B182 Chronic viral hepatitis C: Secondary | ICD-10-CM

## 2015-12-12 DIAGNOSIS — Z8719 Personal history of other diseases of the digestive system: Secondary | ICD-10-CM

## 2015-12-12 DIAGNOSIS — F1120 Opioid dependence, uncomplicated: Secondary | ICD-10-CM | POA: Insufficient documentation

## 2015-12-12 DIAGNOSIS — Z79899 Other long term (current) drug therapy: Secondary | ICD-10-CM

## 2015-12-12 DIAGNOSIS — Z0000 Encounter for general adult medical examination without abnormal findings: Secondary | ICD-10-CM

## 2015-12-12 DIAGNOSIS — N182 Chronic kidney disease, stage 2 (mild): Secondary | ICD-10-CM | POA: Diagnosis not present

## 2015-12-12 DIAGNOSIS — E1165 Type 2 diabetes mellitus with hyperglycemia: Secondary | ICD-10-CM

## 2015-12-12 DIAGNOSIS — Z23 Encounter for immunization: Secondary | ICD-10-CM

## 2015-12-12 DIAGNOSIS — F32A Depression, unspecified: Secondary | ICD-10-CM | POA: Insufficient documentation

## 2015-12-12 DIAGNOSIS — F17200 Nicotine dependence, unspecified, uncomplicated: Secondary | ICD-10-CM

## 2015-12-12 DIAGNOSIS — N183 Chronic kidney disease, stage 3 unspecified: Secondary | ICD-10-CM

## 2015-12-12 DIAGNOSIS — Z794 Long term (current) use of insulin: Secondary | ICD-10-CM | POA: Diagnosis not present

## 2015-12-12 DIAGNOSIS — F17210 Nicotine dependence, cigarettes, uncomplicated: Secondary | ICD-10-CM

## 2015-12-12 DIAGNOSIS — F1911 Other psychoactive substance abuse, in remission: Secondary | ICD-10-CM | POA: Insufficient documentation

## 2015-12-12 DIAGNOSIS — Z8619 Personal history of other infectious and parasitic diseases: Secondary | ICD-10-CM

## 2015-12-12 DIAGNOSIS — G8929 Other chronic pain: Secondary | ICD-10-CM | POA: Insufficient documentation

## 2015-12-12 DIAGNOSIS — N289 Disorder of kidney and ureter, unspecified: Secondary | ICD-10-CM | POA: Insufficient documentation

## 2015-12-12 DIAGNOSIS — Z87898 Personal history of other specified conditions: Secondary | ICD-10-CM | POA: Insufficient documentation

## 2015-12-12 DIAGNOSIS — E785 Hyperlipidemia, unspecified: Secondary | ICD-10-CM

## 2015-12-12 DIAGNOSIS — F329 Major depressive disorder, single episode, unspecified: Secondary | ICD-10-CM | POA: Insufficient documentation

## 2015-12-12 DIAGNOSIS — B351 Tinea unguium: Secondary | ICD-10-CM

## 2015-12-12 DIAGNOSIS — N189 Chronic kidney disease, unspecified: Secondary | ICD-10-CM

## 2015-12-12 LAB — POCT GLYCOSYLATED HEMOGLOBIN (HGB A1C): Hemoglobin A1C: 5.2

## 2015-12-12 LAB — GLUCOSE, CAPILLARY: Glucose-Capillary: 94 mg/dL (ref 65–99)

## 2015-12-12 MED ORDER — LISINOPRIL 5 MG PO TABS: 5.0000 mg | ORAL_TABLET | Freq: Every day | ORAL | 1 refills | Status: DC

## 2015-12-12 MED ORDER — MELOXICAM 15 MG PO TABS: 15.0000 mg | ORAL_TABLET | Freq: Every day | ORAL | 2 refills | Status: DC | PRN

## 2015-12-12 MED ORDER — HUMALOG MIX 75/25 KWIKPEN (75-25) 100 UNIT/ML ~~LOC~~ SUPN: PEN_INJECTOR | SUBCUTANEOUS | 2 refills | Status: DC

## 2015-12-12 MED ORDER — ATENOLOL 50 MG PO TABS: 50.0000 mg | ORAL_TABLET | Freq: Every day | ORAL | 3 refills | Status: DC

## 2015-12-12 NOTE — Assessment & Plan Note (Addendum)
A: She takes meloxicam daily for this.  She also takes baclofen BID as needed.  P: - we discussed the impact of meloxicam on the long-term health of her kidneys and she agreed to use this medication as sparingly as possible going forward. - given her renal function and baclofen primarily being renally cleared, this should be used in caution but her BID dosing should be reasonable for now.  Will have to continue to assess depending on her renal function.

## 2015-12-12 NOTE — Assessment & Plan Note (Signed)
A: In reviewing her records via [facility], she was treated for her HCV infection in July 2015 at [facility] with 12 weeks of Harvoni.  She confirms this information and states she was told that she was cured of her infection.  Her recent CMET from 10/29/2015 shows normal AST and ALT.  P: - no further follow up needed.

## 2015-12-12 NOTE — Assessment & Plan Note (Signed)
Lab Results  Component Value Date   HGBA1C 5.2 12/12/2015   A: Her A1C today is the same as in January of this year indicating too strict glycemic control.  She brought her meter today and it shows a range of readings from 59-169.  Her CBG today is 94.  She denies symptoms of hypoglycemia or hyperglycemia.  She is currently on metformin 1000mg  BID and Humalog 75/25 Kwikpen 13 units in the AM and 8 units in the PM.  She has been hesitant in the past to come off insulin.  Previously, she has had hospitalizations related to hyperglycemia as well as hypoglycemia.  Her A1C in November 2013 was as high as 13.  She follows a diabetic diet fairly well but does admit to frequently drinking regular soda.  P: - we will continue her metformin 1000mg  BID. - we discussed the concern for hypoglycemia given her tight glycemic control and she was agreeable to decreasing her Humalog 75/25 Kwikpen to 8 units in the AM and 7 units in the PM. - we will initiate an ACE inhibitor for HTN and renal benefit.  Her microalbumin/creatinine ratio was 10.4 last year indicating she did not have significant microalbuminuria. - she is due for a foot exam in October but is requesting referral to podiatry today to help with her onychomycosis.  - she is due for an eye exam in November so I asked her to contact Dr. Payton [facility] office to schedule this. - she is not on a statin despite her diabetes so we will check a lipid panel today. - RTC in 2 weeks with glucometer given her insulin changes.

## 2015-12-12 NOTE — Assessment & Plan Note (Addendum)
A/P: - we gave her stool cards last visit and she reports having completed this but that she needs to bring them to our office which she will do on Sept 28 when she returns for follow up.  She had colonoscopy at [facility] in 2015 with biopsies from EGD being positive for H. Pylori and treated with therapy to eradicate this.  She had poor prep for this colonoscopy and EGD. - she received the influenza vaccine today. - she received PPSV-23 in 2013.  She also received Prevnar 13 in June 2017 at her local drug store.  She likely benefited from this given her DM and CKD.  She should be due for 2nd dose of PPSV-23 in 2018 as this will be 5 years from her 1st dose.  She will not need further Prevnar 13. - we will check a lipid panel today.

## 2015-12-12 NOTE — Assessment & Plan Note (Signed)
A: In reviewing her records from prior PCP, she was referred and was to be seen by nephrology in the past but it is not clear whether this occurred.  She has chronic renal insufficiency dating back a few years with creatinine readings of 1.2 - 1.5 from 2014 to 2016.  Her most recent creatinine was 1.52 on October 29, 2015.  I suspect her underlying kidney disease is secondary to diabetes, hypertension, and frequent NSAID use.  P: - we discussed the effect of meloxicam on her kidneys and she agreed to stop taking on a daily basis.  We will leave it on as needed for now but I stressed to her the importance of truly only using as needed. - we will repeat a BMET in a couple weeks when she follows up. - if she has seen nephrology, we will attempt to obtain those records.

## 2015-12-12 NOTE — Patient Instructions (Addendum)
It was great to see you today.  1) only take Meloxicam daily as needed.  Please try to limit how much you use this medication.  2) I am checking your cholesterol levels.  We will call you with the results and what new medications may need to be added.  3) I have added Lisinopril for blood pressure and kidney protection.  Please take this daily.  Watch out for low blood pressure with any lightheadeness, fainting, or unusual fatigue.  4) I have made changes to your insulin.  Please take the following dose of Humalog: 8 units in the AM and 7 in the PM.  We will continue to address this at follow up visits.  5) We gave you the flu vaccine today.  6) please make an appointment with your eye doctor.

## 2015-12-12 NOTE — Assessment & Plan Note (Signed)
BP Readings from Last 3 Encounters:  12/12/15 (!) 166/67  05/31/15 110/70  04/19/15 134/69   A:  Her BP is elevated today but she is without her atenolol for 3 days.  She has not missed any doses of her HCTZ.  She thinks she was placed on atenolol by her provider at the methadone clinic but I am unsure of any clear indication for her to be on a beta blocker as she reports no known history of MI or coronary heart disease.  Additionally, she is not on an ACE inhibitor despite her history of diabetes.  P: - when I attempted to refill her atenolol, I was notified by the pharmacy that this medication is on back order and unavailable.  Seeing as how I do not see a clear indication for her to be on a beta blocker, we have discontinued this medication.  I called the patient and informed her of this over the phone. - we will continue her HCTZ 12.5mg  daily. - we will initiate treatment with low-dose lisinopril 5mg  daily. - CMET from 10/29/2015 showed stable CKD 2-3 with serum creatinine of 1.52 and normal electrolytes. - she will RTC in 2 weeks for an appointment with me at which time we will reassess her BP and check a BMET.

## 2015-12-12 NOTE — Progress Notes (Addendum)
CC: here today for follow up HTN, DM, chronic back pain  HPI:  Rose [name] is a 60 y.o. woman with a past medical history listed below here today for follow up of her HTN, DM, chronic back pain.  For details of today's visit and the status of her chronic medical issues please refer to the assessment and plan.   Past Medical History:  Diagnosis Date  . Anxiety   . Arthritis   . Asthma   . Chronic back pain   . Depression   . Diabetes mellitus   . Edema of lower extremity history cellulitis jan 2013  . Frequency of urination   . Hep C w/o coma, chronic ([place])   . History of chronic bronchitis   . History of drug abuse states quit herion in 2006  . Hypertension   . Methadone maintenance therapy patient ([place])   . Nocturia   . Tendonitis   . Urge urinary incontinence     Review of Systems:   Please see pertinent ROS reviewed in HPI and problem based charting.  Physical Exam:  Vitals:   12/12/15 1015  BP: (!) 166/67  Pulse: 69  Temp: 98.4 F (36.9 C)  TempSrc: Oral  SpO2: 100%  Weight: 165 lb 6.4 oz (75 kg)  Height: 5\' 3"  (1.6 m)   Physical Exam  Constitutional: She is oriented to person, place, and time.  Very pleasant, AA woman, ambulates with cane.  HENT:  Head: Normocephalic and atraumatic.  Eyes: EOM are normal.  Cardiovascular: Normal rate, regular rhythm, normal heart sounds and intact distal pulses.   Pulmonary/Chest: Effort normal and breath sounds normal. She has no wheezes. She has no rales.  Neurological: She is alert and oriented to person, place, and time. No cranial nerve deficit.  Skin: Skin is warm and dry.  Thick toenails with evidence of onycomychosis. No open wounds or ulcers.  Psychiatric: Mood and affect normal.     Assessment & Plan:   See Encounters Tab for problem based charting.  Patient discussed with Dr. Dareen Piano.  HTN (hypertension) BP Readings from Last 3 Encounters:  12/12/15 (!) 166/67  05/31/15 110/70  04/19/15  134/69   A:  Her BP is elevated today but she is without her atenolol for 3 days.  She has not missed any doses of her HCTZ.  She thinks she was placed on atenolol by her provider at the methadone clinic but I am unsure of any clear indication for her to be on a beta blocker as she reports no known history of MI or coronary heart disease.  Additionally, she is not on an ACE inhibitor despite her history of diabetes.  P: - when I attempted to refill her atenolol, I was notified by the pharmacy that this medication is on back order and unavailable.  Seeing as how I do not see a clear indication for her to be on a beta blocker, we have discontinued this medication.  I called the patient and informed her of this over the phone. - we will continue her HCTZ 12.5mg  daily. - we will initiate treatment with low-dose lisinopril 5mg  daily. - CMET from 10/29/2015 showed stable CKD 2-3 with serum creatinine of 1.52 and normal electrolytes. - she will RTC in 2 weeks for an appointment with me at which time we will reassess her BP and check a BMET.  HCV (hepatitis C virus) A: In reviewing her records via [facility], she was treated for her HCV infection in  July 2015 at [facility] with 12 weeks of Harv[place].  She confirms this information and states she was told that she was cured of her infection.  Her recent CMET from 10/29/2015 shows normal AST and ALT.  P: - no further follow up needed.  Diabetes mellitus with complication, with long-term current use of insulin ([place]) Lab Results  Component Value Date   HGBA1C 5.2 12/12/2015   A: Her A1C today is the same as in January of this year indicating too strict glycemic control.  She brought her meter today and it shows a range of readings from 59-169.  Her CBG today is 94.  She denies symptoms of hypoglycemia or hyperglycemia.  She is currently on metformin 1000mg  BID and Humalog 75/25 Kwikpen 13 units in the AM and 8 units in the PM.  She has been hesitant  in the past to come off insulin.  Previously, she has had hospitalizations related to hyperglycemia as well as hypoglycemia.  Her A1C in November 2013 was as high as 13.  She follows a diabetic diet fairly well but does admit to frequently drinking regular soda.  P: - we will continue her metformin 1000mg  BID. - we discussed the concern for hypoglycemia given her tight glycemic control and she was agreeable to decreasing her Humalog 75/25 Kwikpen to 8 units in the AM and 7 units in the PM. - we will initiate an ACE inhibitor for HTN and renal benefit.  Her microalbumin/creatinine ratio was 10.4 last year indicating she did not have significant microalbuminuria. - she is due for a foot exam in October but is requesting referral to podiatry today to help with her onychomycosis.  - she is due for an eye exam in November so I asked her to contact Dr. Payton Emerald office to schedule this. - she is not on a statin despite her diabetes so we will check a lipid panel today. - RTC in 2 weeks with glucometer given her insulin changes.  Chronic renal insufficiency A: In reviewing her records from prior PCP, she was referred and was to be seen by nephrology in the past but it is not clear whether this occurred.  She has chronic renal insufficiency dating back a few years with creatinine readings of 1.2 - 1.5 from 2014 to 2016.  Her most recent creatinine was 1.52 on October 29, 2015.  I suspect her underlying kidney disease is secondary to diabetes, hypertension, and frequent NSAID use.  P: - we discussed the effect of meloxicam on her kidneys and she agreed to stop taking on a daily basis.  We will leave it on as needed for now but I stressed to her the importance of truly only using as needed. - we will repeat a BMET in a couple weeks when she follows up. - if she has seen nephrology, we will attempt to obtain those records.  Chronic back pain A: She takes meloxicam daily for this.  She also takes baclofen BID  as needed.  P: - we discussed the impact of meloxicam on the long-term health of her kidneys and she agreed to use this medication as sparingly as possible going forward. - given her renal function and baclofen primarily being renally cleared, this should be used in caution but her BID dosing should be reasonable for now.  Will have to continue to assess depending on her renal function.  Preventative health care A/P: - we gave her stool cards last visit and she reports having completed this but  that she needs to bring them to our office which she will do on Sept 28 when she returns for follow up.  She had colonoscopy at [facility] in 2015 with biopsies from EGD being positive for H. Pylori and treated with therapy to eradicate this.  She had poor prep for this colonoscopy and EGD. - she received the influenza vaccine today. - she received PPSV-23 in 2013.  She also received Prevnar 13 in June 2017 at her local drug store.  She likely benefited from this given her DM and CKD.  She should be due for 2nd dose of PPSV-23 in 2018 as this will be 5 years from her 1st dose.  She will not need further Prevnar 13. - we will check a lipid panel today.      Hyperlipidemia associated with type 2 diabetes mellitus (HCC) A:  Lipid panel with TC 161, HDL 35, LDL 94.  ASCVD 10-year risk is Q000111Q based on systolic BP yesterday but even with normal systolic BP it is 123456.  Both are recommendations to be on a high-intensity statin.  She had normal liver function testing on 10/29/2015.  These results should be scanned in to her chart.  Her risk is for adverse cardiovascular event is impacted by her DM and HTN, but most importantly by her ongoing tobacco use.  P: - we will begin atorvastatin 40mg  daily - she has an appointment in 2 weeks and we can assess her tolerance to the new medication. - smoking cessation advised. - I called patient and discussed this with her via the telephone at 10:12 AM on 12/13/2015  Tobacco use  disorder A: Currently smokes about 6 cigarettes per day.  P: - smoking cessation advised. - we will continue to address at follow up.

## 2015-12-13 ENCOUNTER — Encounter: Payer: Self-pay | Admitting: Internal Medicine

## 2015-12-13 DIAGNOSIS — E1169 Type 2 diabetes mellitus with other specified complication: Secondary | ICD-10-CM

## 2015-12-13 DIAGNOSIS — E785 Hyperlipidemia, unspecified: Secondary | ICD-10-CM | POA: Insufficient documentation

## 2015-12-13 DIAGNOSIS — F17200 Nicotine dependence, unspecified, uncomplicated: Secondary | ICD-10-CM | POA: Insufficient documentation

## 2015-12-13 HISTORY — DX: Type 2 diabetes mellitus with other specified complication: E11.69

## 2015-12-13 LAB — LIPID PANEL
Chol/HDL Ratio: 4.6 ratio units — ABNORMAL HIGH (ref 0.0–4.4)
Cholesterol, Total: 161 mg/dL (ref 100–199)
HDL: 35 mg/dL — ABNORMAL LOW (ref >39)
LDL Calculated: 94 mg/dL (ref 0–99)
Triglycerides: 159 mg/dL — ABNORMAL HIGH (ref 0–149)
VLDL Cholesterol Cal: 32 mg/dL (ref 5–40)

## 2015-12-13 MED ORDER — ATORVASTATIN CALCIUM 40 MG PO TABS: 40.0000 mg | ORAL_TABLET | Freq: Every day | ORAL | 3 refills | Status: DC

## 2015-12-13 NOTE — Progress Notes (Signed)
Internal Medicine Clinic Attending  Case discussed with Dr. Wallace at the time of the visit.  We reviewed the resident's history and exam and pertinent patient test results.  I agree with the assessment, diagnosis, and plan of care documented in the resident's note.  

## 2015-12-13 NOTE — Addendum Note (Signed)
Addended by: Mignon Pine on: 12/13/2015 10:15 AM   Modules accepted: Orders

## 2015-12-13 NOTE — Assessment & Plan Note (Signed)
A: Currently smokes about 6 cigarettes per day.  P: - smoking cessation advised. - we will continue to address at follow up.

## 2015-12-13 NOTE — Assessment & Plan Note (Signed)
A:  Lipid panel with TC 161, HDL 35, LDL 94.  ASCVD 10-year risk is Q000111Q based on systolic BP yesterday but even with normal systolic BP it is 123456.  Both are recommendations to be on a high-intensity statin.  She had normal liver function testing on 10/29/2015.  These results should be scanned in to her chart.  Her risk is for adverse cardiovascular event is impacted by her DM and HTN, but most importantly by her ongoing tobacco use.  P: - we will begin atorvastatin 40mg  daily - she has an appointment in 2 weeks and we can assess her tolerance to the new medication. - smoking cessation advised. - I called patient and discussed this with her via the telephone at 10:12 AM on 12/13/2015

## 2015-12-14 ENCOUNTER — Other Ambulatory Visit: Payer: Self-pay | Admitting: Internal Medicine

## 2015-12-14 MED ORDER — LANCETS 30G MISC: 5 refills | Status: DC

## 2015-12-14 MED ORDER — GLUCOSE BLOOD VI STRP: ORAL_STRIP | 5 refills | Status: DC

## 2015-12-14 NOTE — Telephone Encounter (Signed)
Spoke with daughter. She was not sure of lancets, but strips are for accu chek aviva meter

## 2015-12-14 NOTE — Telephone Encounter (Signed)
Pt requesting Testing Strips.  Please contact her daugther about which one she uses.

## 2015-12-26 ENCOUNTER — Encounter: Payer: Self-pay | Admitting: Podiatry

## 2015-12-26 ENCOUNTER — Telehealth: Payer: Self-pay | Admitting: Internal Medicine

## 2015-12-26 ENCOUNTER — Ambulatory Visit (INDEPENDENT_AMBULATORY_CARE_PROVIDER_SITE_OTHER): Payer: Medicare Other | Admitting: Podiatry

## 2015-12-26 VITALS — BP 132/87 | HR 68 | Resp 18

## 2015-12-26 DIAGNOSIS — M79675 Pain in left toe(s): Secondary | ICD-10-CM

## 2015-12-26 DIAGNOSIS — M79674 Pain in right toe(s): Secondary | ICD-10-CM

## 2015-12-26 DIAGNOSIS — B351 Tinea unguium: Secondary | ICD-10-CM | POA: Diagnosis not present

## 2015-12-26 NOTE — Progress Notes (Signed)
   Subjective:    Patient ID: Rose [name], female    DOB: March 14, 1956, 60 y.o.   MRN: [id number]  HPI    This patient presents today requesting a diabetic foot examination because of a burning, throbbing, tenderness and swelling that she has in her feet on and off weightbearing over a multiple month period of time. Patient states that she has neuropathy causing the symptoms. She's had no specific treatment for these symptoms. Also, patient is complaining that her toenails are long and thick her uncomfortable when walking wearing shoes and requests nail debridement.  Patient is diabetic and denies history of ulceration, claudication an amputation Patient is a current smoker    Review of Systems  Constitutional: Positive for activity change and unexpected weight change.  HENT: Positive for sinus pressure and sneezing.   Eyes: Positive for itching.  Respiratory: Positive for shortness of breath and wheezing.   Cardiovascular: Positive for leg swelling.  Gastrointestinal: Positive for constipation and nausea.  Endocrine:       Excessive thirst  Genitourinary: Positive for urgency.  Musculoskeletal: Positive for back pain.  Skin:       Change in nails and thick scars  Neurological: Positive for dizziness, weakness, numbness and headaches.  Hematological: Bruises/bleeds easily.  Psychiatric/Behavioral: Positive for confusion. The patient is nervous/anxious.   All other systems reviewed and are negative.      Objective:   Physical Exam  BP 132/87 Pulse 68 Respiration 18  Orientated 3  Vascular: No peripheral edema bilaterally DP and PT pulses 2/4 bilaterally Capillary reflex immediate bilaterally  Neurological: Sensation to 10 g monofilament wire intact 4/5 right 2/5 left Vibratory sensation reactive bilaterally Ankle reflex equal and reactive bilaterally  Dermatological: No open skin lesions bilaterally The toenails are elongated, brittle, deformed, discolored and  tender direct palpation 6-10  Musculoskeletal: Limited range of motion subtalar, midtarsal joints bilaterally Manual motor testing: Dorsi flexion, plantar flexion, inversion, eversion 5/5 bilaterally Patient uses a cane when walking       Assessment & Plan:   Assessment: Insulin control diabetic with history of neuropathy History of drug abuse History of hepatitis C Symptomatic onychomycoses 6-10  Plan: Today I reviewed the results of the examination with patient today. I made aware that her symptoms of burning and tingling are most consistent with neuropathy. As they do not appear to be severe and disabling will not recommend active treatment The toenails 6-10 were debrided mechanically and elected without any bleeding  Reappoint 3 months

## 2015-12-26 NOTE — Patient Instructions (Signed)
Diabetes and Foot Care Diabetes may cause you to have problems because of poor blood supply (circulation) to your feet and legs. This may cause the skin on your feet to become thinner, break easier, and heal more slowly. Your skin may become dry, and the skin may peel and crack. You may also have nerve damage in your legs and feet causing decreased feeling in them. You may not notice minor injuries to your feet that could lead to infections or more serious problems. Taking care of your feet is one of the most important things you can do for yourself.  HOME CARE INSTRUCTIONS  Wear shoes at all times, even in the house. Do not go barefoot. Bare feet are easily injured.  Check your feet daily for blisters, cuts, and redness. If you cannot see the bottom of your feet, use a mirror or ask someone for help.  Wash your feet with warm water (do not use hot water) and mild soap. Then pat your feet and the areas between your toes until they are completely dry. Do not soak your feet as this can dry your skin.  Apply a moisturizing lotion or petroleum jelly (that does not contain alcohol and is unscented) to the skin on your feet and to dry, brittle toenails. Do not apply lotion between your toes.  Trim your toenails straight across. Do not dig under them or around the cuticle. File the edges of your nails with an emery board or nail file.  Do not cut corns or calluses or try to remove them with medicine.  Wear clean socks or stockings every day. Make sure they are not too tight. Do not wear knee-high stockings since they may decrease blood flow to your legs.  Wear shoes that fit properly and have enough cushioning. To break in new shoes, wear them for just a few hours a day. This prevents you from injuring your feet. Always look in your shoes before you put them on to be sure there are no objects inside.  Do not cross your legs. This may decrease the blood flow to your feet.  If you find a minor scrape,  cut, or break in the skin on your feet, keep it and the skin around it clean and dry. These areas may be cleansed with mild soap and water. Do not cleanse the area with peroxide, alcohol, or iodine.  When you remove an adhesive bandage, be sure not to damage the skin around it.  If you have a wound, look at it several times a day to make sure it is healing.  Do not use heating pads or hot water bottles. They may burn your skin. If you have lost feeling in your feet or legs, you may not know it is happening until it is too late.  Make sure your health care provider performs a complete foot exam at least annually or more often if you have foot problems. Report any cuts, sores, or bruises to your health care provider immediately. SEEK MEDICAL CARE IF:   You have an injury that is not healing.  You have cuts or breaks in the skin.  You have an ingrown nail.  You notice redness on your legs or feet.  You feel burning or tingling in your legs or feet.  You have pain or cramps in your legs and feet.  Your legs or feet are numb.  Your feet always feel cold. SEEK IMMEDIATE MEDICAL CARE IF:   There is increasing redness,   swelling, or pain in or around a wound.  There is a red line that goes up your leg.  Pus is coming from a wound.  You develop a fever or as directed by your health care provider.  You notice a bad smell coming from an ulcer or wound.   This information is not intended to replace advice given to you by your health care provider. Make sure you discuss any questions you have with your health care provider.   Document Released: 03/14/2000 Document Revised: 11/17/2012 Document Reviewed: 08/24/2012 Elsevier Interactive Patient Education 2016 [employer]  

## 2015-12-26 NOTE — Telephone Encounter (Signed)
APT. REMINDER CALL, LMTCB °

## 2015-12-27 ENCOUNTER — Other Ambulatory Visit: Payer: Self-pay | Admitting: Internal Medicine

## 2015-12-27 ENCOUNTER — Ambulatory Visit (INDEPENDENT_AMBULATORY_CARE_PROVIDER_SITE_OTHER): Payer: Medicare Other | Admitting: Internal Medicine

## 2015-12-27 ENCOUNTER — Encounter: Payer: Self-pay | Admitting: Internal Medicine

## 2015-12-27 DIAGNOSIS — I129 Hypertensive chronic kidney disease with stage 1 through stage 4 chronic kidney disease, or unspecified chronic kidney disease: Secondary | ICD-10-CM

## 2015-12-27 DIAGNOSIS — E1165 Type 2 diabetes mellitus with hyperglycemia: Secondary | ICD-10-CM

## 2015-12-27 DIAGNOSIS — E118 Type 2 diabetes mellitus with unspecified complications: Secondary | ICD-10-CM

## 2015-12-27 DIAGNOSIS — E784 Other hyperlipidemia: Secondary | ICD-10-CM

## 2015-12-27 DIAGNOSIS — F17210 Nicotine dependence, cigarettes, uncomplicated: Secondary | ICD-10-CM

## 2015-12-27 DIAGNOSIS — E1122 Type 2 diabetes mellitus with diabetic chronic kidney disease: Secondary | ICD-10-CM | POA: Diagnosis not present

## 2015-12-27 DIAGNOSIS — M549 Dorsalgia, unspecified: Secondary | ICD-10-CM

## 2015-12-27 DIAGNOSIS — E785 Hyperlipidemia, unspecified: Secondary | ICD-10-CM

## 2015-12-27 DIAGNOSIS — G8929 Other chronic pain: Secondary | ICD-10-CM

## 2015-12-27 DIAGNOSIS — N182 Chronic kidney disease, stage 2 (mild): Secondary | ICD-10-CM

## 2015-12-27 DIAGNOSIS — I10 Essential (primary) hypertension: Secondary | ICD-10-CM

## 2015-12-27 DIAGNOSIS — Z0000 Encounter for general adult medical examination without abnormal findings: Secondary | ICD-10-CM

## 2015-12-27 DIAGNOSIS — Z794 Long term (current) use of insulin: Secondary | ICD-10-CM

## 2015-12-27 DIAGNOSIS — N183 Chronic kidney disease, stage 3 unspecified: Secondary | ICD-10-CM

## 2015-12-27 DIAGNOSIS — E1169 Type 2 diabetes mellitus with other specified complication: Secondary | ICD-10-CM | POA: Diagnosis not present

## 2015-12-27 DIAGNOSIS — Z79899 Other long term (current) drug therapy: Secondary | ICD-10-CM

## 2015-12-27 LAB — GLUCOSE, CAPILLARY: Glucose-Capillary: 73 mg/dL (ref 65–99)

## 2015-12-27 MED ORDER — HUMALOG MIX 75/25 KWIKPEN (75-25) 100 UNIT/ML ~~LOC~~ SUPN: PEN_INJECTOR | SUBCUTANEOUS | 2 refills | Status: DC

## 2015-12-27 MED ORDER — METFORMIN HCL 1000 MG PO TABS: 1000.0000 mg | ORAL_TABLET | Freq: Two times a day (BID) | ORAL | 1 refills | Status: DC

## 2015-12-27 MED ORDER — DULOXETINE HCL 30 MG PO CPEP: ORAL_CAPSULE | ORAL | 0 refills | Status: DC

## 2015-12-27 NOTE — Progress Notes (Signed)
CC: here today for f/u of DM and HTN  HPI:  [name] is a 60 y.o. woman with a past medical history listed below here today for follow up of her DM and HTN.  For details of today's visit and the status of her chronic medical issues please refer to the assessment and plan.   Past Medical History:  Diagnosis Date  . Anxiety   . Arthritis   . Asthma   . Chronic back pain   . Depression   . Diabetes mellitus   . Edema of lower extremity history cellulitis jan 2013  . Frequency of urination   . Hep C w/o coma, chronic ([place])   . History of chronic bronchitis   . History of drug abuse states quit herion in 2006  . Hypertension   . Methadone maintenance therapy patient ([place])   . Nocturia   . Tendonitis   . Urge urinary incontinence     Review of Systems:   Please see pertinent ROS reviewed in HPI and problem based charting.   Physical Exam:  Vitals:   12/27/15 1354  BP: 127/67  Pulse: 73  Temp: 98.5 F (36.9 C)  TempSrc: Oral  SpO2: 97%  Weight: 160 lb 14.4 oz (73 kg)  Height: 5\' 3"  (1.6 m)   Physical Exam  Constitutional: She appears well-developed and well-nourished.  Very pleasant, ambulates with a cane.  HENT:  Head: Normocephalic and atraumatic.  Cardiovascular: Normal rate, regular rhythm and normal heart sounds.   Pulmonary/Chest: Effort normal and breath sounds normal.  Musculoskeletal: She exhibits no edema.  Skin: Skin is warm and dry.  Psychiatric: She has a normal mood and affect.     Assessment & Plan:   See Encounters Tab for problem based charting.  Patient discussed with Dr. Dareen Piano.   HTN (hypertension) BP Readings from Last 3 Encounters:  12/27/15 127/67  12/26/15 132/87  12/12/15 (!) 166/67   A: She has tolerated lisinopril well since starting a couple weeks ago.  She continues on HCTZ 12.5mg  daily.  BP today is well controlled.  She also brought in a couple of home readings with BP of 125/70 and 130/74.  Her only  complaint is some fatigue after stopping her atenolol but she was reassured that this was likely not the cause as BB can cause fatigue.  P: - BMET today showed stable CKD with creatinine of 1.45.  Her creatinine was 1.52 on 10/29/2015. - continue lisinopril 5mg  daily and HCTZ 12.5mg  daily - encourage regular exercise and healthy diet (decreased salt intake)   Diabetes mellitus with complication, with long-term current use of insulin (HCC) A: She continues on metformin 1000mg  BID and Humalog 75/25 Kwikpen.  She is reluctant to stop using insulin despite too strict of glycemic control.  Over recent office visits she has agreed to lower her dosage and is currently taking 8 units in the AM and 7 units in the PM.  Her meter today shows an average CBG of 87.  I explained that some of her fatigue may be more related to low blood sugars than anything else.  She otherwise has not been experiencing any symptoms of hypo or hyperglycemia.  P: - she agrees to decrease insulin further to 6 units in the morning and 5 units in the evening.  She will continue with metformin - she saw podiatry yesterday and had foot exam as well as debridement of her toenails - reminded her to contact Dr. Payton Emerald office for her  eye exam  Chronic renal insufficiency A: Stable CKD II-III. She reports today that she has never been seen by nephrology.  Her creatinine is currently stable.  She reports since last visit only using her meloxicam twice for her back pain.  She is agreeable to discontinuing this medication to benefit her kidneys.  P: - follow renal function - discontinue meloxicam - consider nephrology referral if needed in the future.  Preventative health care A/P: - she brought in documentation today indicating negative stool card results so she is up to date for another year regarding colon cancer screening.  She reports no melena, BRBPR or other concerning symptoms. - she is still smoking about 6 cigarettes per  day and we discussed the importance of stopping all together. - she is due for Pap smear and denies hx of abnormal Pap.  We have scheduled her to see me in December for this.  Hyperlipidemia associated with type 2 diabetes mellitus ([place]) A: She has started on atorvastatin 40mg  daily and is tolerating well.  No complaints of myalgias.  P: - continue atorvastatin 40mg  daily  Chronic back pain A: She really has what sounds like neuropathic pain and has been utilizing NSAIDs more than other agents that may treat her pain better.  She reports previously being on Lyrica but it made her feel "shaky" so she stopped it.  She is also allergic to gabapentin but is willing to try Cymbalta.  P: - start Cymbalta 30mg  daily and increase to 60mg  daily after 1 week.

## 2015-12-27 NOTE — Patient Instructions (Addendum)
Thank you for coming to see me today. It was a pleasure. Today we talked about:   Please decrease your insulin to 6units in the morning and 5 units in the evening  I have refilled your metformin  Please with hold from taking Meloxicam.  I have taken it off your medication list.  You can use Tylenol for pain.  Do not exceed the recommended doses.  Start Cymbalta for back pain and fibromyalgia.  Take 30mg  for a week then increase to 60mg  daily after that.   Please continue to work on smoking cessation  Please follow-up with me in the following weeks for a Pap smear.  If you have any questions or concerns, please do not hesitate to call the office at [phone].  Take Care,   Jule Ser, DO

## 2015-12-28 DIAGNOSIS — Z9109 Other allergy status, other than to drugs and biological substances: Secondary | ICD-10-CM

## 2015-12-28 HISTORY — DX: Other allergy status, other than to drugs and biological substances: Z91.09

## 2015-12-28 LAB — BMP8+ANION GAP
Anion Gap: 18 mmol/L (ref 10.0–18.0)
BUN/Creatinine Ratio: 13 (ref 9–23)
BUN: 19 mg/dL (ref 6–24)
CO2: 25 mmol/L (ref 18–29)
Calcium: 9.3 mg/dL (ref 8.7–10.2)
Chloride: 103 mmol/L (ref 96–106)
Creatinine, Ser: 1.45 mg/dL — ABNORMAL HIGH (ref 0.57–1.00)
GFR calc Af Amer: 46 mL/min/{1.73_m2} — ABNORMAL LOW (ref >59)
GFR calc non Af Amer: 40 mL/min/{1.73_m2} — ABNORMAL LOW (ref >59)
Glucose: 66 mg/dL (ref 65–99)
Potassium: 4.3 mmol/L (ref 3.5–5.2)
Sodium: 146 mmol/L — ABNORMAL HIGH (ref 134–144)

## 2015-12-28 NOTE — Assessment & Plan Note (Signed)
A/P: - she brought in documentation today indicating negative stool card results so she is up to date for another year regarding colon cancer screening.  She reports no melena, BRBPR or other concerning symptoms. - she is still smoking about 6 cigarettes per day and we discussed the importance of stopping all together. - she is due for Pap smear and denies hx of abnormal Pap.  We have scheduled her to see me in December for this.

## 2015-12-28 NOTE — Assessment & Plan Note (Signed)
A: She has started on atorvastatin 40mg  daily and is tolerating well.  No complaints of myalgias.  P: - continue atorvastatin 40mg  daily

## 2015-12-28 NOTE — Assessment & Plan Note (Signed)
A: Stable CKD II-III. She reports today that she has never been seen by nephrology.  Her creatinine is currently stable.  She reports since last visit only using her meloxicam twice for her back pain.  She is agreeable to discontinuing this medication to benefit her kidneys.  P: - follow renal function - discontinue meloxicam - consider nephrology referral if needed in the future.

## 2015-12-28 NOTE — Assessment & Plan Note (Signed)
BP Readings from Last 3 Encounters:  12/27/15 127/67  12/26/15 132/87  12/12/15 (!) 166/67   A: She has tolerated lisinopril well since starting a couple weeks ago.  She continues on HCTZ 12.5mg  daily.  BP today is well controlled.  She also brought in a couple of home readings with BP of 125/70 and 130/74.  Her only complaint is some fatigue after stopping her atenolol but she was reassured that this was likely not the cause as BB can cause fatigue.  P: - BMET today showed stable CKD with creatinine of 1.45.  Her creatinine was 1.52 on 10/29/2015. - continue lisinopril 5mg  daily and HCTZ 12.5mg  daily - encourage regular exercise and healthy diet (decreased salt intake)

## 2015-12-28 NOTE — Assessment & Plan Note (Signed)
A: She continues on metformin 1000mg  BID and Humalog 75/25 Kwikpen.  She is reluctant to stop using insulin despite too strict of glycemic control.  Over recent office visits she has agreed to lower her dosage and is currently taking 8 units in the AM and 7 units in the PM.  Her meter today shows an average CBG of 87.  I explained that some of her fatigue may be more related to low blood sugars than anything else.  She otherwise has not been experiencing any symptoms of hypo or hyperglycemia.  P: - she agrees to decrease insulin further to 6 units in the morning and 5 units in the evening.  She will continue with metformin - she saw podiatry yesterday and had foot exam as well as debridement of her toenails - reminded her to contact Dr. Payton Emerald office for her eye exam

## 2015-12-28 NOTE — Assessment & Plan Note (Addendum)
A: She really has what sounds like neuropathic pain and has been utilizing NSAIDs more than other agents that may treat her pain better.  She reports previously being on Lyrica but it made her feel "shaky" so she stopped it.  She is also allergic to gabapentin but is willing to try Cymbalta.  P: - start Cymbalta 30mg  daily and increase to 60mg  daily after 1 week.

## 2015-12-31 NOTE — Progress Notes (Signed)
Internal Medicine Clinic Attending  Case discussed with Dr. Wallace at the time of the visit.  We reviewed the resident's history and exam and pertinent patient test results.  I agree with the assessment, diagnosis, and plan of care documented in the resident's note.  

## 2016-02-11 ENCOUNTER — Other Ambulatory Visit: Payer: Self-pay | Admitting: Internal Medicine

## 2016-02-11 DIAGNOSIS — I10 Essential (primary) hypertension: Secondary | ICD-10-CM

## 2016-02-11 NOTE — Telephone Encounter (Signed)
Refill request from pt's pharmacy for baclofen, lisinopril, and atenolol.  Per 12/12/2015 note, atenolol was discontinued.  Will forward to pcp for review, please advise.Despina Hidden Cassady11/13/20174:52 PM

## 2016-02-12 ENCOUNTER — Other Ambulatory Visit: Payer: Self-pay | Admitting: Internal Medicine

## 2016-02-12 DIAGNOSIS — I10 Essential (primary) hypertension: Secondary | ICD-10-CM

## 2016-02-12 MED ORDER — LISINOPRIL 5 MG PO TABS: 5.0000 mg | ORAL_TABLET | Freq: Every day | ORAL | 1 refills | Status: DC

## 2016-02-12 NOTE — Telephone Encounter (Signed)
Call made to patient to confirm what meds she's taking as herpharmacy had requested a medication that was previously discontinued.  Pt states that she asked pharmacy to contact or office for atenolol refill.  She states the lisinopril makes her feel "sluggish" and "tired".  Pt has an upcoming appt on 03/06/16 with her pcp and she is willing to wait until that visit to discuss going back on the atenolol.  Will forward info to pcp to make him aware.  Phone call complete.[name], Rose Cassady11/14/201710:28 AM

## 2016-02-15 ENCOUNTER — Encounter: Payer: Self-pay | Admitting: *Deleted

## 2016-02-19 ENCOUNTER — Other Ambulatory Visit: Payer: Self-pay | Admitting: Internal Medicine

## 2016-02-19 DIAGNOSIS — M549 Dorsalgia, unspecified: Principal | ICD-10-CM

## 2016-02-19 DIAGNOSIS — G8929 Other chronic pain: Secondary | ICD-10-CM

## 2016-02-23 ENCOUNTER — Other Ambulatory Visit: Payer: Self-pay | Admitting: Internal Medicine

## 2016-02-23 DIAGNOSIS — G8929 Other chronic pain: Secondary | ICD-10-CM

## 2016-02-23 DIAGNOSIS — M549 Dorsalgia, unspecified: Principal | ICD-10-CM

## 2016-03-03 ENCOUNTER — Encounter (INDEPENDENT_AMBULATORY_CARE_PROVIDER_SITE_OTHER): Payer: Self-pay

## 2016-03-03 ENCOUNTER — Other Ambulatory Visit: Payer: Self-pay | Admitting: Internal Medicine

## 2016-03-03 ENCOUNTER — Ambulatory Visit (INDEPENDENT_AMBULATORY_CARE_PROVIDER_SITE_OTHER): Payer: Medicare Other | Admitting: Internal Medicine

## 2016-03-03 VITALS — BP 148/71 | HR 78 | Temp 98.0°F | Wt 159.9 lb

## 2016-03-03 DIAGNOSIS — G8929 Other chronic pain: Secondary | ICD-10-CM

## 2016-03-03 DIAGNOSIS — M545 Low back pain, unspecified: Secondary | ICD-10-CM

## 2016-03-03 DIAGNOSIS — Z794 Long term (current) use of insulin: Secondary | ICD-10-CM

## 2016-03-03 DIAGNOSIS — Z7984 Long term (current) use of oral hypoglycemic drugs: Secondary | ICD-10-CM

## 2016-03-03 DIAGNOSIS — I129 Hypertensive chronic kidney disease with stage 1 through stage 4 chronic kidney disease, or unspecified chronic kidney disease: Secondary | ICD-10-CM | POA: Diagnosis not present

## 2016-03-03 DIAGNOSIS — N183 Chronic kidney disease, stage 3 unspecified: Secondary | ICD-10-CM

## 2016-03-03 DIAGNOSIS — E1122 Type 2 diabetes mellitus with diabetic chronic kidney disease: Secondary | ICD-10-CM | POA: Diagnosis not present

## 2016-03-03 DIAGNOSIS — E1165 Type 2 diabetes mellitus with hyperglycemia: Secondary | ICD-10-CM | POA: Insufficient documentation

## 2016-03-03 DIAGNOSIS — E118 Type 2 diabetes mellitus with unspecified complications: Secondary | ICD-10-CM | POA: Diagnosis not present

## 2016-03-03 DIAGNOSIS — Z79899 Other long term (current) drug therapy: Secondary | ICD-10-CM

## 2016-03-03 DIAGNOSIS — I10 Essential (primary) hypertension: Secondary | ICD-10-CM

## 2016-03-03 DIAGNOSIS — IMO0002 Reserved for concepts with insufficient information to code with codable children: Secondary | ICD-10-CM | POA: Insufficient documentation

## 2016-03-03 DIAGNOSIS — F17210 Nicotine dependence, cigarettes, uncomplicated: Secondary | ICD-10-CM

## 2016-03-03 MED ORDER — DULOXETINE HCL 30 MG PO CPEP: 30.0000 mg | ORAL_CAPSULE | Freq: Every day | ORAL | 1 refills | Status: DC

## 2016-03-03 MED ORDER — HUMALOG MIX 75/25 KWIKPEN (75-25) 100 UNIT/ML ~~LOC~~ SUPN: PEN_INJECTOR | SUBCUTANEOUS | 2 refills | Status: DC

## 2016-03-03 NOTE — Progress Notes (Addendum)
   CC: Chronic back pain follow up  HPI:  [name] is a 60 y.o. female with PMHx detailed below presenting for questions/concerns about her Cymbalta that was prescribed for her chronic pain.  See problem based assessment and plan below for additional details.  Past Medical History:  Diagnosis Date  . Anxiety   . Arthritis   . Asthma   . Chronic back pain   . Depression   . Diabetes mellitus   . Edema of lower extremity history cellulitis jan 2013  . Frequency of urination   . Hep C w/o coma, chronic ([place])   . History of chronic bronchitis   . History of drug abuse states quit herion in 2006  . Hypertension   . Methadone maintenance therapy patient ([facility])   . Nocturia   . Tendonitis   . Urge urinary incontinence     Review of Systems: Review of Systems  Constitutional: Negative for chills and fever.  Respiratory: Negative for cough and shortness of breath.   Cardiovascular: Negative for chest pain and palpitations.  Gastrointestinal: Negative for abdominal pain, nausea and vomiting.  Musculoskeletal: Positive for back pain, joint pain and neck pain.  All other systems reviewed and are negative.   Physical Exam: Vitals:   03/03/16 1342  BP: (!) 148/71  Pulse: 78  Temp: 98 F (36.7 C)  TempSrc: Oral  SpO2: 100%  Weight: 159 lb 14.4 oz (72.5 kg)   Body mass index is 28.33 kg/m. GENERAL- Woman sitting comfortably in exam room chair, alert, ambulates with cane HEENT- Atraumatic, moist mucous membranes CARDIAC- Regular rate and rhythm, no murmurs, rubs or gallops. RESP- Clear to ascultation bilaterally, no wheezing or crackles, normal work of breathing ABDOMEN- Soft, nontender, nondistended BACK- Normal curvature, lumbar paraspinal muscles tense and tender to palpation EXTREMITIES- Normal bulk and range of motion, no edema, 2+ peripheral pulses SKIN- Warm, dry, intact, without visible rash PSYCH- Appropriate affect, clear speech, thoughts linear and  goal-directed  Assessment & Plan:   See encounters tab for problem based medical decision making.  Patient seen with Dr. Daryll Drown

## 2016-03-03 NOTE — Assessment & Plan Note (Addendum)
Reported symptom relief with Cymbalta 30 mg daily for the first week but when the dose was increased to 60 mg daily began to experience sharp left lower back pain, episodes daily, non-radiating, relieve by Aleve or rest. Attributed this to Cymbalta but for unclear reasons did not decrease the dose or stop taking it. Lower back tense musculature and tenderness to palpation, likely MSK lower back pain. Asked about PT and she does not want to try, may consider in the future.  Plan: - Reduced to Cymbalta 30mg  daily, advised to stop taking if the pain recurs - In future consider PT referral

## 2016-03-03 NOTE — Assessment & Plan Note (Addendum)
Ongoing over-treatment of DM2, A1c 5.2 in 11/2015, taking Metformin 1000 mg BID and Humalog 75/25 6 units Q AM and 5 units Q PM. Her glucometer readout (11/5-12/4, see media tab for photo) shows average BG 103 with one elevated reading to 212 and seven readings below 80. She likely does not need to be on insulin at this point but is averse to stopping this medications because her "feet and toes start burning" when she tried to reduce her dose of insulin.   Plan: - Continue Metformin 1000 BID - Reduce Humalog 75/25 to 5 units Q AM and 4 units Q PM, advised to gradually reduce by 1 unit each week.

## 2016-03-03 NOTE — Patient Instructions (Addendum)
Please continue to take your medications as prescribed.  We have reduced your Cymbalta to 30 mg daily - if this medication is still causing you problems you can stop taking it.  Your blood sugars are very well controlled, and you likely do not need to be taking insulin. We have reduced your insulin to 5 units in the morning and 4 in the evening. Please continue to reduce this by 1 unit each week until your are off the insulin. You can also decrease the number of times you check your blood sugar to once daily or less.   Reducing your insulin and Cymbalta will likely improve your energy.  We will call you if your lab test results are abnormal or concerning.   Thank you for visiting today!

## 2016-03-03 NOTE — Assessment & Plan Note (Addendum)
CKD 3 stable, Cr stable at approx 1.4 checked in September 2017. Reports taking 1-2 Naproxen every few days for back pain.   Recheck BMP today

## 2016-03-03 NOTE — Assessment & Plan Note (Addendum)
BP 148/71. On recheck 126/68. Reporting ongoing fatigue which she attributes to Lisinopril 5 mg and wants to restart Atenolol that she was taking previously. Discussed Lisinopril is renal-protective and preferred if possible and that too much Cymbalta or insulin could be causing her fatigue. She is amenable to continue to try Lisinopril to see if she "gets used to it."  Continue HCTZ 12.5 mg, Lisinopril 5 mg daily

## 2016-03-04 LAB — BMP8+ANION GAP
Anion Gap: 16 mmol/L (ref 10.0–18.0)
BUN/Creatinine Ratio: 13 (ref 9–23)
BUN: 16 mg/dL (ref 6–24)
CO2: 25 mmol/L (ref 18–29)
Calcium: 8.8 mg/dL (ref 8.7–10.2)
Chloride: 102 mmol/L (ref 96–106)
Creatinine, Ser: 1.27 mg/dL — ABNORMAL HIGH (ref 0.57–1.00)
GFR calc Af Amer: 54 mL/min/{1.73_m2} — ABNORMAL LOW (ref >59)
GFR calc non Af Amer: 47 mL/min/{1.73_m2} — ABNORMAL LOW (ref >59)
Glucose: 65 mg/dL (ref 65–99)
Potassium: 4.6 mmol/L (ref 3.5–5.2)
Sodium: 143 mmol/L (ref 134–144)

## 2016-03-06 ENCOUNTER — Encounter: Payer: Medicare Other | Admitting: Internal Medicine

## 2016-03-07 ENCOUNTER — Encounter: Payer: Medicare Other | Admitting: Obstetrics & Gynecology

## 2016-03-11 NOTE — Progress Notes (Signed)
Internal Medicine Clinic Attending  I saw and evaluated the patient.  I personally confirmed the key portions of the history and exam documented by Dr. Johnson and I reviewed pertinent patient test results.  The assessment, diagnosis, and plan were formulated together and I agree with the documentation in the resident's note.  

## 2016-03-13 ENCOUNTER — Other Ambulatory Visit: Payer: Self-pay | Admitting: Internal Medicine

## 2016-03-13 DIAGNOSIS — Z1231 Encounter for screening mammogram for malignant neoplasm of breast: Secondary | ICD-10-CM

## 2016-03-20 ENCOUNTER — Other Ambulatory Visit: Payer: Self-pay | Admitting: Internal Medicine

## 2016-03-26 ENCOUNTER — Ambulatory Visit: Payer: Medicare Other | Admitting: Podiatry

## 2016-03-26 ENCOUNTER — Other Ambulatory Visit: Payer: Self-pay | Admitting: Internal Medicine

## 2016-04-03 ENCOUNTER — Ambulatory Visit: Payer: Medicare Other

## 2016-04-15 ENCOUNTER — Other Ambulatory Visit: Payer: Self-pay | Admitting: *Deleted

## 2016-04-15 ENCOUNTER — Other Ambulatory Visit: Payer: Self-pay | Admitting: Internal Medicine

## 2016-04-15 MED ORDER — ACCU-CHEK SOFTCLIX LANCET DEV MISC: 12 refills | Status: DC

## 2016-04-15 MED ORDER — GLUCOSE BLOOD VI STRP: ORAL_STRIP | 5 refills | Status: DC

## 2016-04-15 MED ORDER — CETIRIZINE HCL 10 MG PO TABS: 10.0000 mg | ORAL_TABLET | Freq: Every day | ORAL | 5 refills | Status: DC

## 2016-04-16 ENCOUNTER — Ambulatory Visit: Payer: Medicare Other

## 2016-04-17 ENCOUNTER — Encounter: Payer: Medicare Other | Admitting: Internal Medicine

## 2016-04-23 ENCOUNTER — Ambulatory Visit
Admission: RE | Admit: 2016-04-23 | Discharge: 2016-04-23 | Disposition: A | Discharge status: Home / Self Care | Payer: Medicare Other | Source: Ambulatory Visit | Attending: Family Medicine | Admitting: Family Medicine

## 2016-04-23 DIAGNOSIS — Z1231 Encounter for screening mammogram for malignant neoplasm of breast: Secondary | ICD-10-CM

## 2016-04-24 ENCOUNTER — Ambulatory Visit: Payer: Medicare Other

## 2016-04-26 ENCOUNTER — Encounter (HOSPITAL_COMMUNITY): Payer: Self-pay | Admitting: Emergency Medicine

## 2016-04-26 ENCOUNTER — Inpatient Hospital Stay (HOSPITAL_COMMUNITY)
Admission: EM | Admit: 2016-04-26 | Discharge: 2016-04-27 | DRG: 916 | Disposition: A | Discharge status: Home / Self Care | Payer: Medicare Other | Attending: Internal Medicine | Admitting: Internal Medicine

## 2016-04-26 DIAGNOSIS — D72820 Lymphocytosis (symptomatic): Secondary | ICD-10-CM | POA: Diagnosis not present

## 2016-04-26 DIAGNOSIS — Z885 Allergy status to narcotic agent status: Secondary | ICD-10-CM

## 2016-04-26 DIAGNOSIS — Z79899 Other long term (current) drug therapy: Secondary | ICD-10-CM | POA: Diagnosis not present

## 2016-04-26 DIAGNOSIS — F1120 Opioid dependence, uncomplicated: Secondary | ICD-10-CM | POA: Diagnosis present

## 2016-04-26 DIAGNOSIS — Z794 Long term (current) use of insulin: Secondary | ICD-10-CM

## 2016-04-26 DIAGNOSIS — D500 Iron deficiency anemia secondary to blood loss (chronic): Secondary | ICD-10-CM | POA: Diagnosis not present

## 2016-04-26 DIAGNOSIS — T464X5A Adverse effect of angiotensin-converting-enzyme inhibitors, initial encounter: Secondary | ICD-10-CM | POA: Diagnosis present

## 2016-04-26 DIAGNOSIS — N289 Disorder of kidney and ureter, unspecified: Secondary | ICD-10-CM | POA: Diagnosis not present

## 2016-04-26 DIAGNOSIS — N183 Chronic kidney disease, stage 3 (moderate): Secondary | ICD-10-CM | POA: Diagnosis not present

## 2016-04-26 DIAGNOSIS — B182 Chronic viral hepatitis C: Secondary | ICD-10-CM | POA: Diagnosis not present

## 2016-04-26 DIAGNOSIS — E1122 Type 2 diabetes mellitus with diabetic chronic kidney disease: Secondary | ICD-10-CM | POA: Diagnosis present

## 2016-04-26 DIAGNOSIS — N179 Acute kidney failure, unspecified: Secondary | ICD-10-CM | POA: Diagnosis not present

## 2016-04-26 DIAGNOSIS — G8929 Other chronic pain: Secondary | ICD-10-CM | POA: Diagnosis present

## 2016-04-26 DIAGNOSIS — I10 Essential (primary) hypertension: Secondary | ICD-10-CM | POA: Diagnosis present

## 2016-04-26 DIAGNOSIS — I129 Hypertensive chronic kidney disease with stage 1 through stage 4 chronic kidney disease, or unspecified chronic kidney disease: Secondary | ICD-10-CM | POA: Diagnosis not present

## 2016-04-26 DIAGNOSIS — T783XXA Angioneurotic edema, initial encounter: Principal | ICD-10-CM | POA: Diagnosis present

## 2016-04-26 DIAGNOSIS — Z833 Family history of diabetes mellitus: Secondary | ICD-10-CM

## 2016-04-26 DIAGNOSIS — Z888 Allergy status to other drugs, medicaments and biological substances status: Secondary | ICD-10-CM

## 2016-04-26 DIAGNOSIS — D649 Anemia, unspecified: Secondary | ICD-10-CM | POA: Diagnosis present

## 2016-04-26 DIAGNOSIS — F329 Major depressive disorder, single episode, unspecified: Secondary | ICD-10-CM | POA: Diagnosis present

## 2016-04-26 DIAGNOSIS — J45909 Unspecified asthma, uncomplicated: Secondary | ICD-10-CM | POA: Diagnosis present

## 2016-04-26 DIAGNOSIS — E876 Hypokalemia: Secondary | ICD-10-CM | POA: Diagnosis present

## 2016-04-26 DIAGNOSIS — M549 Dorsalgia, unspecified: Secondary | ICD-10-CM | POA: Diagnosis not present

## 2016-04-26 DIAGNOSIS — F17210 Nicotine dependence, cigarettes, uncomplicated: Secondary | ICD-10-CM | POA: Diagnosis not present

## 2016-04-26 DIAGNOSIS — Z8249 Family history of ischemic heart disease and other diseases of the circulatory system: Secondary | ICD-10-CM

## 2016-04-26 DIAGNOSIS — E785 Hyperlipidemia, unspecified: Secondary | ICD-10-CM | POA: Diagnosis present

## 2016-04-26 HISTORY — DX: Adverse effect of angiotensin-converting-enzyme inhibitors, initial encounter: T46.4X5A

## 2016-04-26 LAB — CBC WITH DIFFERENTIAL/PLATELET
Basophils Absolute: 0 10*3/uL (ref 0.0–0.1)
Basophils Relative: 0 %
Eosinophils Absolute: 0.2 10*3/uL (ref 0.0–0.7)
Eosinophils Relative: 1 %
HCT: 37.1 % (ref 36.0–46.0)
Hemoglobin: 11.8 g/dL — ABNORMAL LOW (ref 12.0–15.0)
Lymphocytes Relative: 49 %
Lymphs Abs: 8.4 10*3/uL — ABNORMAL HIGH (ref 0.7–4.0)
MCH: 26.6 pg (ref 26.0–34.0)
MCHC: 31.8 g/dL (ref 30.0–36.0)
MCV: 83.7 fL (ref 78.0–100.0)
Monocytes Absolute: 0.9 10*3/uL (ref 0.1–1.0)
Monocytes Relative: 5 %
Neutro Abs: 7.7 10*3/uL (ref 1.7–7.7)
Neutrophils Relative %: 45 %
Platelets: 175 10*3/uL (ref 150–400)
RBC: 4.43 MIL/uL (ref 3.87–5.11)
RDW: 12.6 % (ref 11.5–15.5)
WBC: 17.2 10*3/uL — ABNORMAL HIGH (ref 4.0–10.5)

## 2016-04-26 LAB — BASIC METABOLIC PANEL
Anion gap: 11 (ref 5–15)
BUN: 19 mg/dL (ref 6–20)
CO2: 27 mmol/L (ref 22–32)
Calcium: 9.1 mg/dL (ref 8.9–10.3)
Chloride: 102 mmol/L (ref 101–111)
Creatinine, Ser: 1.69 mg/dL — ABNORMAL HIGH (ref 0.44–1.00)
GFR calc Af Amer: 37 mL/min — ABNORMAL LOW (ref >60)
GFR calc non Af Amer: 32 mL/min — ABNORMAL LOW (ref >60)
Glucose, Bld: 147 mg/dL — ABNORMAL HIGH (ref 65–99)
Potassium: 3.4 mmol/L — ABNORMAL LOW (ref 3.5–5.1)
Sodium: 140 mmol/L (ref 135–145)

## 2016-04-26 LAB — MRSA PCR SCREENING: MRSA by PCR: NEGATIVE

## 2016-04-26 MED ORDER — ATORVASTATIN CALCIUM 40 MG PO TABS
40.0000 mg | ORAL_TABLET | Freq: Every day | ORAL | Status: DC
  Administered 2016-04-26: 40 mg via ORAL
  Filled 2016-04-26: qty 1

## 2016-04-26 MED ORDER — DIPHENHYDRAMINE HCL 50 MG/ML IJ SOLN
25.0000 mg | Freq: Once | INTRAMUSCULAR | Status: AC
  Administered 2016-04-26: 25 mg via INTRAVENOUS
  Filled 2016-04-26: qty 1

## 2016-04-26 MED ORDER — DULOXETINE HCL 30 MG PO CPEP
30.0000 mg | ORAL_CAPSULE | Freq: Every day | ORAL | Status: DC
  Administered 2016-04-26: 30 mg via ORAL
  Filled 2016-04-26: qty 1

## 2016-04-26 MED ORDER — SENNOSIDES-DOCUSATE SODIUM 8.6-50 MG PO TABS
1.0000 | ORAL_TABLET | Freq: Every day | ORAL | Status: DC
  Filled 2016-04-26: qty 1

## 2016-04-26 MED ORDER — ACETAMINOPHEN 325 MG PO TABS: 650.0000 mg | ORAL_TABLET | Freq: Four times a day (QID) | ORAL | Status: DC | PRN

## 2016-04-26 MED ORDER — HEPARIN SODIUM (PORCINE) 5000 UNIT/ML IJ SOLN
5000.0000 [IU] | Freq: Three times a day (TID) | INTRAMUSCULAR | Status: DC
  Administered 2016-04-26 – 2016-04-27 (×3): 5000 [IU] via SUBCUTANEOUS
  Filled 2016-04-26 (×3): qty 1

## 2016-04-26 MED ORDER — FAMOTIDINE IN NACL 20-0.9 MG/50ML-% IV SOLN
20.0000 mg | Freq: Once | INTRAVENOUS | Status: AC
  Administered 2016-04-26: 20 mg via INTRAVENOUS
  Filled 2016-04-26: qty 50

## 2016-04-26 MED ORDER — HYDROCHLOROTHIAZIDE 12.5 MG PO CAPS
12.5000 mg | ORAL_CAPSULE | Freq: Every day | ORAL | Status: DC
  Administered 2016-04-26 – 2016-04-27 (×2): 12.5 mg via ORAL
  Filled 2016-04-26 (×2): qty 1

## 2016-04-26 MED ORDER — BACLOFEN 10 MG PO TABS: 5.0000 mg | ORAL_TABLET | Freq: Two times a day (BID) | ORAL | Status: DC | PRN

## 2016-04-26 MED ORDER — EPINEPHRINE 0.3 MG/0.3ML IJ SOAJ
0.3000 mg | Freq: Once | INTRAMUSCULAR | Status: AC
  Administered 2016-04-26: 0.3 mg via INTRAMUSCULAR
  Filled 2016-04-26: qty 0.3

## 2016-04-26 MED ORDER — METHYLPREDNISOLONE SODIUM SUCC 125 MG IJ SOLR
125.0000 mg | Freq: Once | INTRAMUSCULAR | Status: AC
  Administered 2016-04-26: 125 mg via INTRAVENOUS
  Filled 2016-04-26: qty 2

## 2016-04-26 MED ORDER — LORATADINE 10 MG PO TABS
10.0000 mg | ORAL_TABLET | Freq: Every day | ORAL | Status: DC
  Administered 2016-04-26 – 2016-04-27 (×2): 10 mg via ORAL
  Filled 2016-04-26 (×2): qty 1

## 2016-04-26 MED ORDER — ACETAMINOPHEN 650 MG RE SUPP: 650.0000 mg | Freq: Four times a day (QID) | RECTAL | Status: DC | PRN

## 2016-04-26 MED ORDER — PROMETHAZINE HCL 25 MG PO TABS: 12.5000 mg | ORAL_TABLET | Freq: Four times a day (QID) | ORAL | Status: DC | PRN

## 2016-04-26 MED ORDER — METHADONE HCL 10 MG PO TABS
100.0000 mg | ORAL_TABLET | Freq: Every day | ORAL | Status: DC
  Administered 2016-04-26 – 2016-04-27 (×2): 100 mg via ORAL
  Filled 2016-04-26 (×2): qty 10

## 2016-04-26 NOTE — ED Notes (Signed)
Nurse drawing labs. 

## 2016-04-26 NOTE — ED Notes (Signed)
Swelling looks significantly better at this time. Speech is now much more clear. Pt controlling secretions better. ENT at bedside.

## 2016-04-26 NOTE — ED Notes (Signed)
Attempted report 

## 2016-04-26 NOTE — ED Triage Notes (Signed)
Pt states she woke up to use bathroom at 1230 and she noticed that her tong was swollen, states she is having 7/10 pain on her mouth, denies any SOB, pt AO x4 NAD  Noticed.

## 2016-04-26 NOTE — ED Notes (Signed)
Pt ambulated to the restroom. Sheets and blankets changed due to orange juice spilled on the bed.

## 2016-04-26 NOTE — ED Notes (Signed)
Pt states she feels as though the swelling in her tongue has improved. I agree that it looks slightly less swollen. Pt resting comfortably at this time.

## 2016-04-26 NOTE — ED Notes (Signed)
Pt's tongue is still very swollen at this time. Pt resting comfortably, no respiratory difficulty noted.

## 2016-04-26 NOTE — H&P (Signed)
Date: 04/26/2016               Patient Name:  Rose [name] MRN: [id number]  DOB: [birth year] Age / Sex: 61 y.o., female   PCP: Jule Ser, DO         Medical Service: Internal Medicine Teaching Service         Attending Physician: Dr. Joni Reining    First Contact: Dr. Asencion Partridge Pager: [phone]  Second Contact: Dr. Burgess Estelle Pager: [phone]       After Hours (After 5p/  First Contact Pager: [phone]  weekends / holidays): Second Contact Pager: [phone]   Chief Complaint: Tongue swelling  History of Present Illness: Adaijah Parveen is a 61 y.o. woman with PMH T2DM, chronic back pain, CKD3, and HTN recently started on Lisinopril who presents for tongue swelling. She reports that she had a normal day yesterday, took Lisinopril in the AM. She ate a ham sandwich and Coke for dinner last night. She reports trying rye bread for the first time, gifted by her neighbor, with her sandwich. She dozed of watching TV, woke and used the restroom, fell and hit her head on the tub with minimal trauma, no LOC. Went back to sleep and woke around 1230 to use the restroom and noticed that she could not close her mouth and that her tongue was very swollen. She came straight to the ED. She initially had difficulty swallowing and was drooling, but had no difficulty breathing. She denies fevers, chills, tooth pain.  In the ED vitals afebrile, HR 81, RR 15, BP 161/79, SpO2 99% on RA with massive tongue swelling consistent with angioedema, airway patent. ENT evaluated with laryngoscopy and determined that the edema was limited to the tongue and oral cavity only, no laryngeal involvement. Labs remarkable for K 3.4 and Cr 1.69 (baseline 1.2-3). She received IV epinephrine, methylprednisolone, diphenydramine, famotidine with no discernible improvement in her angioedema. IMTS contacted for admission.   Meds:  Current Meds  Medication Sig  . albuterol (PROVENTIL HFA;VENTOLIN HFA) 108 (90 Base) MCG/ACT  inhaler Inhale 2 puffs into the lungs every 6 (six) hours as needed. For wheezing  . atorvastatin (LIPITOR) 40 MG tablet Take 1 tablet (40 mg total) by mouth daily.  . baclofen (LIORESAL) 10 MG tablet TAKE 1 TABLET BY MOUTH TWICE DAILY AS NEEDED FOR MUSCLE SPASMS  . cetirizine (ZYRTEC) 10 MG tablet Take 1 tablet (10 mg total) by mouth daily.  . DULoxetine (CYMBALTA) 30 MG capsule Take 1 capsule (30 mg total) by mouth daily.  [name] HUMALOG MIX 75/25 KWIKPEN (75-25) 100 UNIT/ML Kwikpen Take 5 units in the morning and 4 units in the evening.  . hydrochlorothiazide (MICROZIDE) 12.5 MG capsule TAKE ONE CAPSULE BY MOUTH EVERY DAY  . lisinopril (PRINIVIL,ZESTRIL) 5 MG tablet Take 1 tablet (5 mg total) by mouth daily.  . metFORMIN (GLUCOPHAGE) 1000 MG tablet Take 1 tablet (1,000 mg total) by mouth 2 (two) times daily.  . methadone (DOLOPHINE) 10 MG/5ML solution Take 100 mg by mouth daily.    Allergies: Allergies as of 04/26/2016 - Review Complete 04/26/2016  Allergen Reaction Noted  . Gabapentin Hives and Other (See Comments) 03/24/2011  . Hydrocodone-acetaminophen Hives 08/26/2013  . Vicodin [hydrocodone-acetaminophen] Hives, Diarrhea, and Other (See Comments) 03/24/2011   Past Medical History:  Diagnosis Date  . Anxiety   . Arthritis   . Asthma   . Chronic back pain   . Depression   . Diabetes mellitus   .  Edema of lower extremity history cellulitis jan 2013  . Frequency of urination   . Hep C w/o coma, chronic ([place])   . History of chronic bronchitis   . History of drug abuse states quit herion in 2006  . Hypertension   . Methadone maintenance therapy patient ([facility])   . Nocturia   . Tendonitis   . Urge urinary incontinence     Family History:  Family History  Problem Relation Age of Onset  . Hypertension Mother   . Diabetes type II Mother   . CAD Father   . Hypertension Brother   . Diabetes type II Brother   . Diabetes type II Sister     Social History:  Social History    Social History  . Marital status: Single    Spouse name: N/A  . Number of children: N/A  . Years of education: N/A   Occupational History  . Not on file.   Social History Main Topics  . Smoking status: Current Every Day Smoker    Packs/day: 0.10    Years: 20.00    Types: Cigarettes  . Smokeless tobacco: Never Used     Comment: 6 cigs per day  . Alcohol use No  . Drug use: No     Comment: methadone program  PER PT QUIT HEROIN 2006  ALSO HX MARIJUANA USE  . Sexual activity: No   Other Topics Concern  . Not on file   Social History Narrative  . No narrative on file    Review of Systems: A complete ROS was negative except as per HPI.   Physical Exam: Blood pressure 146/74, pulse 77, temperature 97.6 F (36.4 C), temperature source Oral, resp. rate 14, height 5\' 3"  (1.6 m), weight 159 lb (72.1 kg), last menstrual period 07/31/2011, SpO2 97 %.  General appearance: Well-developed woman resting comfortably in bed, in no distress, mild dysarthria and unable to close mouth completely, conversational HENT: Normocephalic, atraumatic, moist mucous membranes, tongue visualized and diffusely edematous with patches of erythema, edematous changes of the sublingual tissues seen as well, mild tenderness to palpation submandibularly Eyes: PERRL, EOM inact, non-icteric Cardiovascular: Regular rate and rhythm, no murmurs, rubs, gallops Respiratory: Clear to auscultation bilaterally, normal work of breathing Abdomen: BS+, soft, non-tender, non-distended Extremities: Normal bulk and range of motion, no edema, 2+ peripheral pulses Skin: Warm, dry, intact Neuro: Alert and oriented, cranial nerves grossly intact, tongue midline Psych: Appropriate affect, clear speech, thoughts linear and goal-directed  Assessment & Plan by Problem: Active Problems:   ACE inhibitor-aggravated angioedema, initial encounter  Angioedema, likely related to recently-started Lisinopril. Edema limited to oral  cavity on laryngoscopy. Airway patent, mild improvement in swelling s/p solumedrol, benadryl, and epinephrine. May also be due to food allergy from rye bread, although she denies any indigestion, abdominal pain, bloating, diarrhea. - Observe while resuming all home meds except Lisinopril and ensure no recurrence of symptoms - dc Lisinopril, added to allergy list - Thin liquid diet - Telemetry monitoring   AKI on CD3, Cr 1.69 from baseline 1.2-1.3, likely due to starting ACEi or poor po intake - Trend BMP - IV fluids if unable to maintain hydration po  Hypokalemia, mild, K 3.4 - Trend BMP - Replete 40 mEq po when swelling improved   T2DM - SSI-S and CBG TID AC HS  HTN - resume home HCTZ, discontinued Lisinopril  Chronic opioid dependence - hx IVDU on methadone maintenance, chronic back pain - continue Methadone 100 mg QD  Chronic  back pain - continue home Baclofen, Cymbalta, and Methadone  HLD - home Lipitor  FEN/GI: CM diet, replete electrolytes as needed  DVT ppx: Lovenox  Code status: Full code  Dispo: Admit patient to Observation with expected length of stay less than 2 midnights.  Signed: Asencion Partridge, MD 04/26/2016, 9:32 AM  Pager: [phone]

## 2016-04-26 NOTE — Consult Note (Signed)
Reason for Consult: Angioedema  HPI:  Rose [name] is an 61 y.o. female who presents to the ER today c/o tongue swelling. The patient has a history of T2DM, chronic back pain, CKD3, and HTN. She was recently started on lisinorpil. She was awakened at about 12:30 AM by a sense that her tongue was swollen. She was having difficulty speaking but was able to swallow and breathe. This has never happened before. She has no previous history of ENT surgery.    Past Medical History:  Diagnosis Date  . Anxiety   . Arthritis   . Asthma   . Chronic back pain   . Depression   . Diabetes mellitus   . Edema of lower extremity history cellulitis jan 2013  . Frequency of urination   . Hep C w/o coma, chronic ([place])   . History of chronic bronchitis   . History of drug abuse states quit herion in 2006  . Hypertension   . Methadone maintenance therapy patient ([place])   . Nocturia   . Tendonitis   . Urge urinary incontinence     Past Surgical History:  Procedure Laterality Date  . APPENDECTOMY  1972  . INTERSTIM IMPLANT PLACEMENT  10/23/2011   Procedure: Barrie Lyme IMPLANT FIRST STAGE;  Surgeon: Reece Packer, MD;  Location: [facility];  Service: Urology;  Laterality: N/A;  . INTERSTIM IMPLANT PLACEMENT  10/23/2011   Procedure: Barrie Lyme IMPLANT SECOND STAGE;  Surgeon: Reece Packer, MD;  Location: [name];  Service: Urology;  Laterality: N/A;    Family History  Problem Relation Age of Onset  . Hypertension Mother   . Diabetes type II Mother   . CAD Father   . Hypertension Brother   . Diabetes type II Brother   . Diabetes type II Sister    Social History:  reports that she has been smoking Cigarettes.  She has a 2.00 pack-year smoking history. She has never used smokeless tobacco. She reports that she does not drink alcohol or use drugs.  Allergies:  Allergies  Allergen Reactions  . Gabapentin Hives and Other (See Comments)    Only when takes  over 300 mg dose. "gives me the shakes"  . Hydrocodone-Acetaminophen Hives    Other reaction(s): GI Upset (intolerance)  . Vicodin [Hydrocodone-Acetaminophen] Hives, Diarrhea and Other (See Comments)    Stomach  cramps    Prior to Admission medications   Medication Sig Start Date End Date Taking? Authorizing Provider  albuterol (PROVENTIL HFA;VENTOLIN HFA) 108 (90 Base) MCG/ACT inhaler Inhale 2 puffs into the lungs every 6 (six) hours as needed. For wheezing 04/19/15  Yes Jule Ser, DO  atorvastatin (LIPITOR) 40 MG tablet Take 1 tablet (40 mg total) by mouth daily. 12/13/15 12/12/16 Yes Jule Ser, DO  baclofen (LIORESAL) 10 MG tablet TAKE 1 TABLET BY MOUTH TWICE DAILY AS NEEDED FOR MUSCLE SPASMS 02/12/16  Yes Jule Ser, DO  cetirizine (ZYRTEC) 10 MG tablet Take 1 tablet (10 mg total) by mouth daily. 04/15/16  Yes Jule Ser, DO  DULoxetine (CYMBALTA) 30 MG capsule Take 1 capsule (30 mg total) by mouth daily. 03/03/16  Yes Asencion Partridge, MD  HUMALOG MIX 75/25 KWIKPEN (75-25) 100 UNIT/ML Kwikpen Take 5 units in the morning and 4 units in the evening. 03/03/16  Yes Asencion Partridge, MD  hydrochlorothiazide (MICROZIDE) 12.5 MG capsule TAKE ONE CAPSULE BY MOUTH EVERY DAY 03/26/16  Yes Jule Ser, DO  lisinopril (PRINIVIL,ZESTRIL) 5 MG tablet Take 1 tablet (5 mg  total) by mouth daily. 02/12/16  Yes Jule Ser, DO  metFORMIN (GLUCOPHAGE) 1000 MG tablet Take 1 tablet (1,000 mg total) by mouth 2 (two) times daily. 12/27/15  Yes Jule Ser, DO  methadone (DOLOPHINE) 10 MG/5ML solution Take 100 mg by mouth daily.   Yes Historical Provider, MD  glucose blood (ACCU-CHEK AVIVA PLUS) test strip Check blood sugar 3 times a day with accu chek aviva plus strips 04/15/16   Jule Ser, DO  Lancet Devices [facility]) lancets Check blood sugar 3 times a day. Patient is insulin requiring, ICD 10 code E11.9. 04/15/16   Jule Ser, DO  Lancets 30G MISC Check blood sugar 3 times a day  12/14/15   Jule Ser, DO    Results for orders placed or performed during the hospital encounter of 04/26/16 (from the past 48 hour(s))  Basic metabolic panel     Status: Abnormal   Collection Time: 04/26/16  5:52 AM  Result Value Ref Range   Sodium 140 135 - 145 mmol/L   Potassium 3.4 (L) 3.5 - 5.1 mmol/L   Chloride 102 101 - 111 mmol/L   CO2 27 22 - 32 mmol/L   Glucose, Bld 147 (H) 65 - 99 mg/dL   BUN 19 6 - 20 mg/dL   Creatinine, Ser 1.69 (H) 0.44 - 1.00 mg/dL   Calcium 9.1 8.9 - 10.3 mg/dL   GFR calc non Af Amer 32 (L) >60 mL/min   GFR calc Af Amer 37 (L) >60 mL/min    Comment: (NOTE) The eGFR has been calculated using the CKD EPI equation. This calculation has not been validated in all clinical situations. eGFR's persistently <60 mL/min signify possible Chronic Kidney Disease.    Anion gap 11 5 - 15  CBC with Differential     Status: Abnormal   Collection Time: 04/26/16  5:52 AM  Result Value Ref Range   WBC 17.2 (H) 4.0 - 10.5 K/uL   RBC 4.43 3.87 - 5.11 MIL/uL   Hemoglobin 11.8 (L) 12.0 - 15.0 g/dL   HCT 37.1 36.0 - 46.0 %   MCV 83.7 78.0 - 100.0 fL   MCH 26.6 26.0 - 34.0 pg   MCHC 31.8 30.0 - 36.0 g/dL   RDW 12.6 11.5 - 15.5 %   Platelets 175 150 - 400 K/uL   Neutrophils Relative % 45 %   Lymphocytes Relative 49 %   Monocytes Relative 5 %   Eosinophils Relative 1 %   Basophils Relative 0 %   Neutro Abs 7.7 1.7 - 7.7 K/uL   Lymphs Abs 8.4 (H) 0.7 - 4.0 K/uL   Monocytes Absolute 0.9 0.1 - 1.0 K/uL   Eosinophils Absolute 0.2 0.0 - 0.7 K/uL   Basophils Absolute 0.0 0.0 - 0.1 K/uL   WBC Morphology ABSOLUTE LYMPHOCYTOSIS     Comment: ATYPICAL LYMPHOCYTES    No results found.  Review of Systems: A complete ROS was negative except as noted above.  Blood pressure 157/80, pulse 87, temperature 97.6 F (36.4 C), temperature source Oral, resp. rate 16, height 5' 3" (1.6 m), weight 159 lb (72.1 kg), last menstrual period 07/31/2011, SpO2 100 %. General  appearance: alert, cooperative and appears stated age Head: Normocephalic, without obvious abnormality, atraumatic Eyes: conjunctivae/corneas clear. PERRL, EOM's intact. Fundi benign. Ears: normal TM's and external ear canals both ears Nose: Nares normal. Septum midline. Mucosa normal. No drainage or sinus tenderness. Throat: Significantly edematous tongue. Normal lips. Neck: no adenopathy, no carotid bruit, no JVD, supple, symmetrical,  trachea midline and thyroid not enlarged, symmetric, no tenderness/mass/nodules Resp: clear to auscultation bilaterally. No stridor, no respiratory distress. Chest wall: no tenderness Extremities: extremities normal, atraumatic, no cyanosis or edema Skin: Skin color, texture, turgor normal. No rashes or lesions Neurologic: Alert and oriented X 3, normal strength and tone. Normal symmetric reflexes. Normal coordination and gait  Procedure:  Flexible Fiberoptic Laryngoscopy Anesthesia: Topical oxymetazoline and lidocaine Indication: Angioedema Description: Risks, benefits, and alternatives of flexible endoscopy were explained to the patient. Specific mention was made of the risk of throat numbness with difficulty swallowing, possible bleeding from the nose and mouth, and pain from the procedure.  The patient gave oral consent to proceed.  The nasal cavities were decongested and anesthetised with a combination of oxymetazoline and 4% lidocaine solution.  The flexible scope was inserted into the right nasal cavity and advanced towards the nasopharynx.  Visualized mucosa over the turbinates and septum were normal.  The nasopharynx was clear.  Oropharyngeal walls were symmetric and mobile without lesion, mass, or edema.  Hypopharynx was also without  lesion or edema.  Larynx was mobile without lesions.  No lesions or asymmetry in the supraglottic larynx.  Arytenoid mucosa was normal.  Posterior commissure normal.  True vocal folds were symmetric and mobile. Glottic opening  was widely patent.    Assessment/Plan: Angioedema-- limited to the tongue and the oral cavity. No laryngeal involvement. Slightly improved since arrival at the ER. Patient will be admitted for monitoring. She may follow up with me as needed. D/c lisinopril.  Margart Zemanek,SUI W 04/26/2016, 10:04 AM

## 2016-04-26 NOTE — ED Notes (Signed)
Patient ambulated to the restroom and back to the room without assistantance. Mild swelling noted to the face.

## 2016-04-26 NOTE — ED Provider Notes (Signed)
[facility] DEPT Provider Note   CSN: [id number] Arrival date & time: 04/26/16  [id number]     History   Chief Complaint Chief Complaint  Patient presents with  . Allergic Reaction    HPI Rose [name] is a 61 y.o. female.  She was awakened at about 12:30 AM by a sense that her tongue was swollen. It has gotten worse since then. She is having difficulty speaking but is able to swallow and breathe. This is never happened before. She does take lisinopril for high blood pressure. Nothing makes symptoms better nothing makes it worse.   The history is provided by the patient.  Allergic Reaction    Past Medical History:  Diagnosis Date  . Anxiety   . Arthritis   . Asthma   . Chronic back pain   . Depression   . Diabetes mellitus   . Edema of lower extremity history cellulitis jan 2013  . Frequency of urination   . Hep C w/o coma, chronic ([place])   . History of chronic bronchitis   . History of drug abuse states quit herion in 2006  . Hypertension   . Methadone maintenance therapy patient ([place])   . Nocturia   . Tendonitis   . Urge urinary incontinence     Patient Active Problem List   Diagnosis Date Noted  . Uncontrolled type 2 diabetes mellitus with complication ([place]) 123456  . Environmental allergies 12/28/2015  . Hyperlipidemia associated with type 2 diabetes mellitus ([place]) 12/13/2015  . Tobacco use disorder 12/13/2015  . Chronic renal insufficiency 12/12/2015  . Depression 12/12/2015  . Methadone maintenance therapy patient ([place])   . History of drug abuse   . Chronic back pain   . Falls 05/31/2015  . Bereavement due to life event 01/25/2015  . Preventative health care 12/14/2014  . HCV (hepatitis C virus) 08/26/2013  . Airway hyperreactivity 08/26/2013  . Arthritis, degenerative 08/26/2013  . Diabetes mellitus with complication, with long-term current use of insulin ([place]) 02/27/2012  . HTN (hypertension) 02/27/2012    Past Surgical History:  Procedure  Laterality Date  . APPENDECTOMY  1972  . INTERSTIM IMPLANT PLACEMENT  10/23/2011   Procedure: Barrie Lyme IMPLANT FIRST STAGE;  Surgeon: Reece Packer, MD;  Location: [facility];  Service: Urology;  Laterality: N/A;  . INTERSTIM IMPLANT PLACEMENT  10/23/2011   Procedure: Barrie Lyme IMPLANT SECOND STAGE;  Surgeon: Reece Packer, MD;  Location: [facility];  Service: Urology;  Laterality: N/A;    OB History    No data available       Home Medications    Prior to Admission medications   Medication Sig Start Date End Date Taking? Authorizing Provider  albuterol (PROVENTIL HFA;VENTOLIN HFA) 108 (90 Base) MCG/ACT inhaler Inhale 2 puffs into the lungs every 6 (six) hours as needed. For wheezing 04/19/15   Jule Ser, DO  atorvastatin (LIPITOR) 40 MG tablet Take 1 tablet (40 mg total) by mouth daily. 12/13/15 12/12/16  Jule Ser, DO  baclofen (LIORESAL) 10 MG tablet TAKE 1 TABLET BY MOUTH TWICE DAILY AS NEEDED FOR MUSCLE SPASMS 02/12/16   Jule Ser, DO  cetirizine (ZYRTEC) 10 MG tablet Take 1 tablet (10 mg total) by mouth daily. 04/15/16   Jule Ser, DO  DULoxetine (CYMBALTA) 30 MG capsule Take 1 capsule (30 mg total) by mouth daily. 03/03/16   Asencion Partridge, MD  glucose blood (ACCU-CHEK AVIVA PLUS) test strip Check blood sugar 3 times a day with accu chek aviva  plus strips 04/15/16   Jule Ser, DO  HUMALOG MIX 75/25 KWIKPEN (75-25) 100 UNIT/ML Kwikpen Take 5 units in the morning and 4 units in the evening. 03/03/16   Asencion Partridge, MD  hydrochlorothiazide (MICROZIDE) 12.5 MG capsule TAKE ONE CAPSULE BY MOUTH EVERY DAY 03/26/16   Jule Ser, DO  Lancet Devices [facility]) lancets Check blood sugar 3 times a day. Patient is insulin requiring, ICD 10 code E11.9. 04/15/16   Jule Ser, DO  Lancets 30G MISC Check blood sugar 3 times a day 12/14/15   Jule Ser, DO  lisinopril (PRINIVIL,ZESTRIL) 5 MG tablet Take 1 tablet (5 mg  total) by mouth daily. 02/12/16   Jule Ser, DO  metFORMIN (GLUCOPHAGE) 1000 MG tablet Take 1 tablet (1,000 mg total) by mouth 2 (two) times daily. 12/27/15   Jule Ser, DO  methadone (DOLOPHINE) 10 MG/5ML solution Take 100 mg by mouth daily.    Historical Provider, MD    Family History Family History  Problem Relation Age of Onset  . Hypertension Mother   . Diabetes type II Mother   . CAD Father   . Hypertension Brother   . Diabetes type II Brother   . Diabetes type II Sister     Social History Social History  Substance Use Topics  . Smoking status: Current Every Day Smoker    Packs/day: 0.10    Years: 20.00    Types: Cigarettes  . Smokeless tobacco: Never Used     Comment: 6 cigs per day  . Alcohol use No     Allergies   Gabapentin; Hydrocodone-acetaminophen; and Vicodin [hydrocodone-acetaminophen]   Review of Systems Review of Systems  All other systems reviewed and are negative.    Physical Exam Updated Vital Signs BP 150/70 (BP Location: Right Arm)   Pulse 92   Temp 97.6 F (36.4 C) (Oral)   Resp 19   Ht 5\' 3"  (1.6 m)   Wt 159 lb (72.1 kg)   LMP 07/31/2011   SpO2 96%   BMI 28.17 kg/m   Physical Exam  Nursing note and vitals reviewed.  61 year old female, resting comfortably and in no acute distress. Vital signs are significant for hypertension. Oxygen saturation is 96%, which is normal. Head is normocephalic and atraumatic. PERRLA, EOMI. Tongue is massively swollen with swelling of the sublingual tissue. Unable to clearly see the pharynx, but she is tolerating her secretions well. Speech is difficult because of swollen tongue, but no stridor present. Neck is nontender and supple without adenopathy or JVD. Back is nontender and there is no CVA tenderness. Lungs are clear without rales, wheezes, or rhonchi. Chest is nontender. Heart has regular rate and rhythm without murmur. Abdomen is soft, flat, nontender without masses or  hepatosplenomegaly and peristalsis is normoactive. Extremities have no cyanosis or edema, full range of motion is present. Skin is warm and dry without rash. Neurologic: Mental status is normal, cranial nerves are intact, there are no motor or sensory deficits.  ED Treatments / Results  Labs (all labs ordered are listed, but only abnormal results are displayed) Labs Reviewed  BASIC METABOLIC PANEL - Abnormal; Notable for the following:       Result Value   Potassium 3.4 (*)    Glucose, Bld 147 (*)    Creatinine, Ser 1.69 (*)    GFR calc non Af Amer 32 (*)    GFR calc Af Amer 37 (*)    All other components within normal limits  CBC WITH  DIFFERENTIAL/PLATELET - Abnormal; Notable for the following:    WBC 17.2 (*)    Hemoglobin 11.8 (*)    All other components within normal limits    Procedures Procedures (including critical care time) CRITICAL CARE Performed by: [id number] Total critical care time: 60 minutes Critical care time was exclusive of separately billable procedures and treating other patients. Critical care was necessary to treat or prevent imminent or life-threatening deterioration. Critical care was time spent personally by me on the following activities: development of treatment plan with patient and/or surrogate as well as nursing, discussions with consultants, evaluation of patient's response to treatment, examination of patient, obtaining history from patient or surrogate, ordering and performing treatments and interventions, ordering and review of laboratory studies, ordering and review of radiographic studies, pulse oximetry and re-evaluation of patient's condition.  Medications Ordered in ED Medications  methylPREDNISolone sodium succinate (SOLU-MEDROL) 125 mg/2 mL injection 125 mg (not administered)  EPINEPHrine (EPI-PEN) injection 0.3 mg (not administered)  diphenhydrAMINE (BENADRYL) injection 25 mg (not administered)  famotidine (PEPCID) IVPB 20 mg premix  (not administered)     Initial Impression / Assessment and Plan / ED Course  I have reviewed the triage vital signs and the nursing notes.  Pertinent labs & imaging results that were available during my care of the patient were reviewed by me and considered in my medical decision making (see chart for details).  Angioedema of the tongue of which is probably related to lisinopril. She is given epinephrine, methylprednisolone, diphenhydramine, famotidine and will need to be carefully observed. Old records are reviewed, and she has no relevant past visits.  Patient has been observed in the ED. There's been no progression of her swelling, but she is not in condition to be discharged safely. Case is discussed with Dr. Tiburcio Pea of internal medicine teaching service who agrees to admit the patient to stepdown unit. Because of threats or airway, cricothyroidotomy tray will need to be kept at patient's bedside.  Final Clinical Impressions(s) / ED Diagnoses   Final diagnoses:  ACE inhibitor-aggravated angioedema, initial encounter  Renal insufficiency  Normochromic normocytic anemia    New Prescriptions New Prescriptions   No medications on file     Delora Fuel, MD [id number]

## 2016-04-27 DIAGNOSIS — Z79899 Other long term (current) drug therapy: Secondary | ICD-10-CM

## 2016-04-27 DIAGNOSIS — Z794 Long term (current) use of insulin: Secondary | ICD-10-CM

## 2016-04-27 DIAGNOSIS — Z888 Allergy status to other drugs, medicaments and biological substances status: Secondary | ICD-10-CM

## 2016-04-27 DIAGNOSIS — F1221 Cannabis dependence, in remission: Secondary | ICD-10-CM

## 2016-04-27 DIAGNOSIS — T783XXA Angioneurotic edema, initial encounter: Secondary | ICD-10-CM | POA: Diagnosis not present

## 2016-04-27 DIAGNOSIS — I129 Hypertensive chronic kidney disease with stage 1 through stage 4 chronic kidney disease, or unspecified chronic kidney disease: Secondary | ICD-10-CM

## 2016-04-27 DIAGNOSIS — E785 Hyperlipidemia, unspecified: Secondary | ICD-10-CM | POA: Diagnosis not present

## 2016-04-27 DIAGNOSIS — G8929 Other chronic pain: Secondary | ICD-10-CM

## 2016-04-27 DIAGNOSIS — Z8249 Family history of ischemic heart disease and other diseases of the circulatory system: Secondary | ICD-10-CM

## 2016-04-27 DIAGNOSIS — F1120 Opioid dependence, uncomplicated: Secondary | ICD-10-CM

## 2016-04-27 DIAGNOSIS — F17210 Nicotine dependence, cigarettes, uncomplicated: Secondary | ICD-10-CM | POA: Diagnosis not present

## 2016-04-27 DIAGNOSIS — M549 Dorsalgia, unspecified: Secondary | ICD-10-CM

## 2016-04-27 DIAGNOSIS — N179 Acute kidney failure, unspecified: Secondary | ICD-10-CM | POA: Diagnosis not present

## 2016-04-27 DIAGNOSIS — E876 Hypokalemia: Secondary | ICD-10-CM

## 2016-04-27 DIAGNOSIS — D649 Anemia, unspecified: Secondary | ICD-10-CM | POA: Diagnosis not present

## 2016-04-27 DIAGNOSIS — E1122 Type 2 diabetes mellitus with diabetic chronic kidney disease: Secondary | ICD-10-CM | POA: Diagnosis not present

## 2016-04-27 DIAGNOSIS — Z885 Allergy status to narcotic agent status: Secondary | ICD-10-CM

## 2016-04-27 DIAGNOSIS — N183 Chronic kidney disease, stage 3 (moderate): Secondary | ICD-10-CM

## 2016-04-27 DIAGNOSIS — J45909 Unspecified asthma, uncomplicated: Secondary | ICD-10-CM | POA: Diagnosis not present

## 2016-04-27 DIAGNOSIS — D72820 Lymphocytosis (symptomatic): Secondary | ICD-10-CM | POA: Diagnosis not present

## 2016-04-27 DIAGNOSIS — Z833 Family history of diabetes mellitus: Secondary | ICD-10-CM

## 2016-04-27 LAB — COMPREHENSIVE METABOLIC PANEL
ALT: 9 U/L — ABNORMAL LOW (ref 14–54)
AST: 17 U/L (ref 15–41)
Albumin: 3.8 g/dL (ref 3.5–5.0)
Alkaline Phosphatase: 53 U/L (ref 38–126)
Anion gap: 9 (ref 5–15)
BUN: 16 mg/dL (ref 6–20)
CO2: 26 mmol/L (ref 22–32)
Calcium: 9.2 mg/dL (ref 8.9–10.3)
Chloride: 101 mmol/L (ref 101–111)
Creatinine, Ser: 1.31 mg/dL — ABNORMAL HIGH (ref 0.44–1.00)
GFR calc Af Amer: 51 mL/min — ABNORMAL LOW (ref >60)
GFR calc non Af Amer: 44 mL/min — ABNORMAL LOW (ref >60)
Glucose, Bld: 114 mg/dL — ABNORMAL HIGH (ref 65–99)
Potassium: 3.7 mmol/L (ref 3.5–5.1)
Sodium: 136 mmol/L (ref 135–145)
Total Bilirubin: 0.6 mg/dL (ref 0.3–1.2)
Total Protein: 7.1 g/dL (ref 6.5–8.1)

## 2016-04-27 LAB — CBC
HCT: 35.4 % — ABNORMAL LOW (ref 36.0–46.0)
Hemoglobin: 11.5 g/dL — ABNORMAL LOW (ref 12.0–15.0)
MCH: 26.6 pg (ref 26.0–34.0)
MCHC: 32.5 g/dL (ref 30.0–36.0)
MCV: 81.8 fL (ref 78.0–100.0)
Platelets: 170 10*3/uL (ref 150–400)
RBC: 4.33 MIL/uL (ref 3.87–5.11)
RDW: 12.1 % (ref 11.5–15.5)
WBC: 14.4 10*3/uL — ABNORMAL HIGH (ref 4.0–10.5)

## 2016-04-27 LAB — GLUCOSE, CAPILLARY: Glucose-Capillary: 115 mg/dL — ABNORMAL HIGH (ref 65–99)

## 2016-04-27 MED ORDER — ATENOLOL 50 MG PO TABS: 25.0000 mg | ORAL_TABLET | Freq: Every day | ORAL | 1 refills | Status: DC

## 2016-04-27 MED ORDER — SODIUM CHLORIDE 0.9 % IV SOLN: INTRAVENOUS | Status: DC

## 2016-04-27 NOTE — H&P (Signed)
Internal Medicine Attending Admission Note  I saw and evaluated the patient. I reviewed the resident's note and I agree with the resident's findings and plan as documented in the resident's note.  Assessment & Plan by Problem:   ACE inhibitor-aggravated angioedema - Angioedema resolved on my exam this morning.  We discussed with patinet and her daughter who is a nurse that she cannot take ACE inhibitors. -Stable for d/c home  HTN -D/C lisinopril, added to allergy list, may resume HCTZ and can add back atenolol.  Follow up in clinic  CKD stage 3 - Avoid ACEi, needs close follow up for BP control.  If ARB considered would need to have risk benefit discussion but would avoid at this time.  Chief Complaint(s):swelling of tongue   History - key components related to admission:Briefly Rose [name] is a 61 yo female with history of T2DM, chronic back pain, CKD, HTN who presents from tongue swelling.  Swelling started day night prior to admission and she came straight to the ED.  Her history is notable that she was started on lisinopril a few months ago when her atenolol was out of stock as well as for the extra renal protective benefit.  She otherwise did not have any recent foods, insect bites/stinges or other new contacts.  In the ED she was evaluated by ENT who preformed laryngoscopy>> swelling limited to tongue.  She receive epinephrine, steroids, and histamine blockers.  And admitted for airway observation.  Today on exam she is very pleased as the swelling has completely resolved overnight.  Lab results: Reviewed in Epic  Physical Exam - key components related to admission: General: resting in bed HEENT: PERRL, EOMI, no scleral icterus, no swelling of lips, tongue, or submental space Cardiac: RRR, no rubs, murmurs or gallops Pulm: clear to auscultation bilaterally, moving normal volumes of air   Vitals:   04/26/16 2356 04/27/16 0400 04/27/16 0500 04/27/16 0752  BP: (!) 165/83 (!)  142/70    Pulse: 69 77  80  Resp: 12 10  12   Temp: 98.3 F (36.8 C) 98 F (36.7 C)  98 F (36.7 C)  TempSrc: Oral Oral  Oral  SpO2: 96% 96%  96%  Weight:   149 lb 11.2 oz (67.9 kg)   Height:

## 2016-04-27 NOTE — Discharge Instructions (Signed)
Angioedema  Angioedema is sudden swelling in the body. The swelling can happen in any part of the body. It often happens on the skin and causes itchy, bumpy patches (hives) to form.  This condition may:  · Happen only one time.  · Happen more than one time. It may come back at random times.  · Keep coming back for a number of years. Someday it may stop coming back.    Follow these instructions at home:  · Take over-the-counter and prescription medicines only as told by your doctor.  · If you were given medicines for emergency allergy treatment, always carry them with you.  · Wear a medical bracelet as told by your doctor.  · Avoid the things that cause your attacks (triggers).  · If this condition was passed to you from your parents and you want to have kids, talk to your doctor. Your kids may also have this condition.  Contact a doctor if:  · You have another attack.  · Your attacks happen more often, even after you take steps to prevent them.  · This condition was passed to you by your parents and you want to have kids.  Get help right away if:  · Your mouth, tongue, or lips get very swollen.  · You have trouble breathing.  · You have trouble swallowing.  · You pass out (faint).  This information is not intended to replace advice given to you by your health care provider. Make sure you discuss any questions you have with your health care provider.  Document Released: 03/05/2009 Document Revised: 10/17/2015 Document Reviewed: 09/25/2015  Elsevier Interactive Patient Education © 2017 [employer] Inc[employer]

## 2016-04-27 NOTE — Progress Notes (Signed)
Subjective: Tongue swelling has completely resolved overnight. She is eating, drinking, talking, swallowing with no difficulty. Completely asymptomatic and interested in going home today. Discussed that she should avoid taking Lisinopril ever again in the future.   Objective: Vital signs in last 24 hours: Vitals:   04/26/16 2356 04/27/16 0400 04/27/16 0500 04/27/16 0752  BP: (!) 165/83 (!) 142/70    Pulse: 69 77  80  Resp: 12 10  12   Temp: 98.3 F (36.8 C) 98 F (36.7 C)  98 F (36.7 C)  TempSrc: Oral Oral  Oral  SpO2: 96% 96%  96%  Weight:   149 lb 11.2 oz (67.9 kg)   Height:        Intake/Output Summary (Last 24 hours) at 04/27/16 0933 Last data filed at 04/26/16 1700  Gross per 24 hour  Intake              240 ml  Output                0 ml  Net              240 ml    Physical Exam General appearance: Well-developed woman resting in bed in no distress, convsersational with no dysarthria HENT: Normocephalic, atraumatic, normal appearing tongue with full mobility Cardiovascular: Regular rate and rhythm, no murmurs, rubs, gallops Respiratory/Chest: Clear to ausculation bilaterally, normal work of breathing Abdomen: Bowel sounds present, soft, non-tender, non-distended Skin: Warm, dry, intact, scars over bilateral forearms Psych: Positive affect  Labs / Imaging / Procedures: CBC Latest Ref Rng & Units 04/27/2016 04/26/2016 11/07/2014  WBC 4.0 - 10.5 K/uL 14.4(H) 17.2(H) 14.3(H)  Hemoglobin 12.0 - 15.0 g/dL 11.5(L) 11.8(L) 13.6  Hematocrit 36.0 - 46.0 % 35.4(L) 37.1 42.0  Platelets 150 - 400 K/uL 170 175 140(L)   BMP Latest Ref Rng & Units 04/27/2016 04/26/2016 03/03/2016  Glucose 65 - 99 mg/dL 114(H) 147(H) 65  BUN 6 - 20 mg/dL 16 19 16   Creatinine 0.44 - 1.00 mg/dL 1.31(H) 1.69(H) 1.27(H)  BUN/Creat Ratio 9 - 23 - - 13  Sodium 135 - 145 mmol/L 136 140 143  Potassium 3.5 - 5.1 mmol/L 3.7 3.4(L) 4.6  Chloride 101 - 111 mmol/L 101 102 102  CO2 22 - 32 mmol/L 26 27 25     Calcium 8.9 - 10.3 mg/dL 9.2 9.1 8.8   No results found.  Assessment/Plan:   Active Problems:   ACE inhibitor-aggravated angioedema, initial encounter  Angioedema, resolved, likely related to recently-started Lisinopril. Edema limited to oral cavity on laryngoscopy. Airway patent, mild improvement in swelling s/p solumedrol, benadryl, and epinephrine. May also be due to food allergy from rye bread, although she denies any indigestion, abdominal pain, bloating, diarrhea. - Resolved and tolerating diet, stable for home, PCP follow up on 2/8  AKI on CD3, resolved, transient admission Cr 1.69 from baseline 1.2-1.3, likely due to starting ACEi or poor po intake - Encouraged fluid intake  Hypokalemia, mild, K 3.4 on admit, resolved  T2DM - SSI-S and CBG TID AC HS  HTN, some hypertension overnight - resume home HCTZ, discontinued Lisinopril - resuming Atenolol 25 mg on discharge, was on 50 mg previously  Chronic opioid dependence - hx IVDU on methadone maintenance, chronic back pain - continue Methadone 100 mg QD  Chronic back pain - continue home Baclofen, Cymbalta, and Methadone  HLD - home Lipitor  FEN/GI: CM diet, repleted electrolytes as needed  Dispo: Anticipated discharge today.   LOS: 1 day  Asencion Partridge, MD 04/27/2016, 9:33 AM Pager: [phone]

## 2016-04-27 NOTE — Discharge Summary (Signed)
Name: Rose [name] MRN: [id number] DOB: 02-12-1956 61 y.o. PCP: Jule Ser, DO  Date of Admission: 04/26/2016  4:54 AM Date of Discharge: 04/27/2016 Attending Physician: Lucious Groves, DO  Discharge Diagnosis: 1. ACE inhibitor-aggravated angioedema 2. Hypertension 3. Lymphocytosis  Active Problems:   HTN (hypertension)   ACE inhibitor-aggravated angioedema, initial encounter   Lymphocytosis   Discharge Medications: Allergies as of 04/27/2016      Reactions   Lisinopril Swelling   Angioedema   Gabapentin Hives, Other (See Comments)   Only when takes over 300 mg dose. "gives me the shakes"   Hydrocodone-acetaminophen Hives   Other reaction(s): GI Upset (intolerance)   Vicodin [hydrocodone-acetaminophen] Hives, Diarrhea, Other (See Comments)   Stomach  cramps      Medication List    TAKE these medications   accu-chek softclix lancets Check blood sugar 3 times a day. Patient is insulin requiring, ICD 10 code E11.9.   albuterol 108 (90 Base) MCG/ACT inhaler Commonly known as:  PROVENTIL HFA;VENTOLIN HFA Inhale 2 puffs into the lungs every 6 (six) hours as needed. For wheezing   atenolol 50 MG tablet Commonly known as:  TENORMIN Take 0.5 tablets (25 mg total) by mouth daily.   atorvastatin 40 MG tablet Commonly known as:  LIPITOR Take 1 tablet (40 mg total) by mouth daily.   baclofen 10 MG tablet Commonly known as:  LIORESAL TAKE 1 TABLET BY MOUTH TWICE DAILY AS NEEDED FOR MUSCLE SPASMS   cetirizine 10 MG tablet Commonly known as:  ZYRTEC Take 1 tablet (10 mg total) by mouth daily.   DULoxetine 30 MG capsule Commonly known as:  CYMBALTA Take 1 capsule (30 mg total) by mouth daily.   glucose blood test strip Commonly known as:  ACCU-CHEK AVIVA PLUS Check blood sugar 3 times a day with accu chek aviva plus strips   HUMALOG MIX 75/25 KWIKPEN (75-25) 100 UNIT/ML Kwikpen Generic drug:  Insulin Lispro Prot & Lispro Take 5 units in the morning and 4  units in the evening.   hydrochlorothiazide 12.5 MG capsule Commonly known as:  MICROZIDE TAKE ONE CAPSULE BY MOUTH EVERY DAY   Lancets 30G Misc Check blood sugar 3 times a day   metFORMIN 1000 MG tablet Commonly known as:  GLUCOPHAGE Take 1 tablet (1,000 mg total) by mouth 2 (two) times daily.   methadone 10 MG/5ML solution Commonly known as:  DOLOPHINE Take 100 mg by mouth daily.       Disposition and follow-up:   Rose [name] was discharged from [facility] in Stable condition.  At the hospital follow up visit please address:  1.  ACE inhibitor-aggravated angioedema - avoid all ACE inhibitors in the future, and extreme caution with ARB medications  Hypertension - assess blood pressure control, prescribed Atenolol 25 mg and HCTZ 12.5 mg  Lymphocytosis, absolute - assess blood counts, consider immunophenotyping if this persists  2.  Labs / imaging needed at time of follow-up: CBC with differential, BMP  3.  Pending labs/ test needing follow-up: None  Follow-up Appointments: Follow-up Information    Jule Ser, DO. Go on 05/08/2016.   Specialty:  Internal Medicine Why:  At 3:45 PM, visit with PCP , hospital follow up visit Contact information: [place] [phone] [phone]           Hospital Course by problem list: Active Problems:   HTN (hypertension)   ACE inhibitor-aggravated angioedema, initial encounter   Lymphocytosis   1. ACE inhibitor-aggravated  angioedema Rose [name] is a 61 y.o. woman with PMH T2DM, chronic back pain, CKD3, and HTN recently started on Lisinopril who presented on 1/27 with massive tongue swelling that developed acutely overnight. She received IV methylprednisolone, epinephrine, benadryl on arrival. Laryngoscopy revealed the edema was localized to the oral cavity. She was admitted to the stepdown unit for close monitoring given concern for her airway. All home medications were gradually  resumed with complete resolution of her angioedema by 1/28 and she was deemed stable for discharge.  2. Hypertension - lisinopril removed from medication list and added to allergy list, patient resumed home HCTZ 12.5 mg overnight but remained hypertensive to SBP 150s+ . On discharge prescribed additional Atenolol 25 mg daily as this is a medication she has taken previously.  3. Absolute lymphocytosis - CBC with differential revealed absolute lymph count of 8.4 with comment of atypical lymphocytes on 1/27, pathologist smear review simply stated this is absolute lymphocytosis but suggested immunophenotyping if it persists.  Discharge Vitals:   BP (!) 142/70 (BP Location: Right Arm)   Pulse 80   Temp 97.4 F (36.3 C) (Oral)   Resp 12   Ht 5\' 3"  (1.6 m)   Wt 149 lb 11.2 oz (67.9 kg)   LMP 07/31/2011   SpO2 96%   BMI 26.52 kg/m   Pertinent Labs, Studies, and Procedures:  Bedside laryngoscopy by ENT on 1/27 determined the edema was limited to the tongue and oral cavity only, no laryngeal involvement  Discharge Instructions: Discharge Instructions    Diet - low sodium heart healthy    Complete by:  As directed    Discharge instructions    Complete by:  As directed    Please stop taking, and avoid the medication LISINOPRIL at all costs in the future. This caused life-threatening tongue swelling (angioedema).  We have restarted Atenolol 25 mg (half tablet daily) to take instead. Otherwise we have made no changes to your medications aside from stopping Lisinopril.  Please follow up with your PCP on 2/8.   Increase activity slowly    Complete by:  As directed       Signed: Asencion Partridge, MD 04/29/2016, 11:37 AM   Pager: [phone]

## 2016-04-28 LAB — PATHOLOGIST SMEAR REVIEW

## 2016-04-29 ENCOUNTER — Telehealth: Payer: Self-pay | Admitting: Internal Medicine

## 2016-04-29 ENCOUNTER — Other Ambulatory Visit: Payer: Self-pay | Admitting: Internal Medicine

## 2016-04-29 DIAGNOSIS — D72820 Lymphocytosis (symptomatic): Secondary | ICD-10-CM

## 2016-04-29 NOTE — Telephone Encounter (Signed)
APT. REMINDER CALL, LMTCB °

## 2016-04-30 ENCOUNTER — Ambulatory Visit (INDEPENDENT_AMBULATORY_CARE_PROVIDER_SITE_OTHER): Payer: Medicare Other | Admitting: Internal Medicine

## 2016-04-30 VITALS — BP 129/63 | HR 88 | Temp 98.2°F | Wt 153.1 lb

## 2016-04-30 DIAGNOSIS — Z79899 Other long term (current) drug therapy: Secondary | ICD-10-CM

## 2016-04-30 DIAGNOSIS — R296 Repeated falls: Secondary | ICD-10-CM | POA: Diagnosis not present

## 2016-04-30 DIAGNOSIS — M545 Low back pain: Secondary | ICD-10-CM

## 2016-04-30 DIAGNOSIS — D72820 Lymphocytosis (symptomatic): Secondary | ICD-10-CM | POA: Diagnosis not present

## 2016-04-30 DIAGNOSIS — T783XXA Angioneurotic edema, initial encounter: Secondary | ICD-10-CM

## 2016-04-30 DIAGNOSIS — I10 Essential (primary) hypertension: Secondary | ICD-10-CM

## 2016-04-30 DIAGNOSIS — M549 Dorsalgia, unspecified: Secondary | ICD-10-CM

## 2016-04-30 DIAGNOSIS — T464X5A Adverse effect of angiotensin-converting-enzyme inhibitors, initial encounter: Secondary | ICD-10-CM

## 2016-04-30 DIAGNOSIS — Z09 Encounter for follow-up examination after completed treatment for conditions other than malignant neoplasm: Secondary | ICD-10-CM | POA: Diagnosis not present

## 2016-04-30 DIAGNOSIS — Z87892 Personal history of anaphylaxis: Secondary | ICD-10-CM | POA: Diagnosis not present

## 2016-04-30 DIAGNOSIS — G8929 Other chronic pain: Secondary | ICD-10-CM

## 2016-04-30 DIAGNOSIS — F17210 Nicotine dependence, cigarettes, uncomplicated: Secondary | ICD-10-CM

## 2016-04-30 DIAGNOSIS — Z9181 History of falling: Secondary | ICD-10-CM

## 2016-04-30 HISTORY — DX: Repeated falls: R29.6

## 2016-04-30 MED ORDER — DULOXETINE HCL 30 MG PO CPEP: 30.0000 mg | ORAL_CAPSULE | Freq: Every day | ORAL | 1 refills | Status: DC

## 2016-04-30 MED ORDER — ATENOLOL 50 MG PO TABS: 25.0000 mg | ORAL_TABLET | Freq: Every day | ORAL | 1 refills | Status: DC

## 2016-04-30 NOTE — Assessment & Plan Note (Signed)
BP Readings from Last 3 Encounters:  04/30/16 129/63  04/27/16 (!) 142/70  03/03/16 (!) 148/71   Blood pressure was at goal today- She is currently only taking HCTZ. She was taken off the lisinopril due to ACEi induced angioedema. Currently not taking atenolol- she is interested in being on atenolol.  Plan -continue HCTZ -re-started atenolol

## 2016-04-30 NOTE — Assessment & Plan Note (Signed)
Pt was admitted for angioedema due to lisinopril, now off of ACEi.   Plan -Resolved. -Patient to be off ACE or ARBs.

## 2016-04-30 NOTE — Assessment & Plan Note (Addendum)
Absolute lymphocytosis was noted on the CBC while she was in the hospital.  Plan -As she was just recently discharged from the hospital this weekend, would recommend re-checking CBC in a month or so when she follows up on Feb 8th.

## 2016-04-30 NOTE — Patient Instructions (Addendum)
Thank you for your visit today  Please continue your hydrochlorothiazide and atenolol daily  Please follow up on your regular appointment

## 2016-04-30 NOTE — Progress Notes (Signed)
    CC: hospital follow up for ACE inhibitor angioedema, HTN, falls HPI: Ms.Renise Hiemenz is a 61 y.o. woman with PMH noted below here for ACE inhibitor angioedema, HTN, falls  Please see Problem List/A&P for the status of the patient's chronic medical problems   Past Medical History:  Diagnosis Date  . Anxiety   . Arthritis   . Asthma   . Chronic back pain   . Depression   . Diabetes mellitus   . Edema of lower extremity history cellulitis jan 2013  . Frequency of urination   . Hep C w/o coma, chronic ([place])   . History of chronic bronchitis   . History of drug abuse states quit herion in 2006  . Hypertension   . Methadone maintenance therapy patient ([place])   . Nocturia   . Tendonitis   . Urge urinary incontinence     Review of Systems: Denies fevers, chills, weight loss, fatigue Denies dysphagia, cough, SOB Denies n/v/abd pain Has fallen 3 times in 6 months     Physical Exam: Vitals:   04/30/16 1329  BP: 129/63  Pulse: 88  Temp: 98.2 F (36.8 C)  TempSrc: Oral  SpO2: 99%  Weight: 153 lb 1.6 oz (69.4 kg)    General: A&O, in NAD HEENT: MMM, normal tongue  Neck: supple, midline trachea, no cervical lymphadenopathy  CV: RRR, normal s1, s2, no m/r/g, Resp: equal and symmetric breath sounds, no wheezing or crackles  Abdomen: soft, nontender, nondistended, +BS   Assessment & Plan:   See encounters tab for problem based medical decision making. Patient discussed with Dr. Daryll Drown

## 2016-04-30 NOTE — Assessment & Plan Note (Signed)
Patient is here for hospital follow up. She said that on Monday, she slipped on her bathroom rug and fell forwards hitting her forehead. She denies loss of consciousness or dizziness. She denies any headaches.. She says she has fallen about 3 times in the last 6 months in a similar fashion. She currently uses a cane. I checked her gait in the room and she has normal gait.  Offered PT eval but patient does not want it.  Plan -continue to monitor the falls since patient currently does not want any physical therapy evaluation  -advised to remove small rugs etc on which she can slip.

## 2016-04-30 NOTE — Assessment & Plan Note (Signed)
Pt requested refill of her cymbalta  Plan -Refilled cymbalta

## 2016-05-01 NOTE — Progress Notes (Signed)
Internal Medicine Clinic Attending  Case discussed with Dr. Tiburcio Pea at the time of the visit.  We reviewed the resident's history and exam and pertinent patient test results.  I agree with the assessment, diagnosis, and plan of care documented in the resident's note.  Absolute lymphocytosis noted on last CBC.  Her absolute lymphocyte count is around 8000.  This is a moderate level.  On review of previous CBCs she has had mild lymphocytosis at times, but never to this degree.  It may be related to acute inflammation/infection as she was recently admitted to the hospital.  If this is so, would resolve in 1-2 months, so agree with Dr. Sherlynn Carbon plan.  Will definitely need to monitor this closely.  Could be sign of impending lymphoproliferative disorder.

## 2016-05-08 ENCOUNTER — Encounter: Payer: Medicare Other | Admitting: Internal Medicine

## 2016-05-20 ENCOUNTER — Other Ambulatory Visit: Payer: Self-pay | Admitting: Internal Medicine

## 2016-05-21 NOTE — Telephone Encounter (Signed)
°  baclofen (LIORESAL) 10 MG tablet [facility]

## 2016-06-05 ENCOUNTER — Ambulatory Visit (INDEPENDENT_AMBULATORY_CARE_PROVIDER_SITE_OTHER): Payer: Medicare Other | Admitting: Internal Medicine

## 2016-06-05 ENCOUNTER — Encounter: Payer: Self-pay | Admitting: Internal Medicine

## 2016-06-05 ENCOUNTER — Other Ambulatory Visit (HOSPITAL_COMMUNITY)
Admission: RE | Admit: 2016-06-05 | Discharge: 2016-06-05 | Disposition: A | Discharge status: Home / Self Care | Payer: Medicare Other | Source: Ambulatory Visit | Attending: Internal Medicine | Admitting: Internal Medicine

## 2016-06-05 VITALS — BP 146/98 | HR 74 | Temp 98.0°F | Ht 63.0 in | Wt 156.8 lb

## 2016-06-05 DIAGNOSIS — E118 Type 2 diabetes mellitus with unspecified complications: Secondary | ICD-10-CM | POA: Diagnosis not present

## 2016-06-05 DIAGNOSIS — Z0000 Encounter for general adult medical examination without abnormal findings: Secondary | ICD-10-CM

## 2016-06-05 DIAGNOSIS — E1122 Type 2 diabetes mellitus with diabetic chronic kidney disease: Secondary | ICD-10-CM | POA: Diagnosis not present

## 2016-06-05 DIAGNOSIS — B977 Papillomavirus as the cause of diseases classified elsewhere: Secondary | ICD-10-CM

## 2016-06-05 DIAGNOSIS — N183 Chronic kidney disease, stage 3 unspecified: Secondary | ICD-10-CM

## 2016-06-05 DIAGNOSIS — G8929 Other chronic pain: Secondary | ICD-10-CM

## 2016-06-05 DIAGNOSIS — Z79899 Other long term (current) drug therapy: Secondary | ICD-10-CM

## 2016-06-05 DIAGNOSIS — Z794 Long term (current) use of insulin: Secondary | ICD-10-CM | POA: Diagnosis not present

## 2016-06-05 DIAGNOSIS — F17210 Nicotine dependence, cigarettes, uncomplicated: Secondary | ICD-10-CM | POA: Diagnosis not present

## 2016-06-05 DIAGNOSIS — M545 Low back pain: Secondary | ICD-10-CM

## 2016-06-05 DIAGNOSIS — D72820 Lymphocytosis (symptomatic): Secondary | ICD-10-CM | POA: Diagnosis not present

## 2016-06-05 DIAGNOSIS — Z01411 Encounter for gynecological examination (general) (routine) with abnormal findings: Secondary | ICD-10-CM

## 2016-06-05 DIAGNOSIS — I129 Hypertensive chronic kidney disease with stage 1 through stage 4 chronic kidney disease, or unspecified chronic kidney disease: Secondary | ICD-10-CM

## 2016-06-05 DIAGNOSIS — E11649 Type 2 diabetes mellitus with hypoglycemia without coma: Secondary | ICD-10-CM

## 2016-06-05 DIAGNOSIS — Z1151 Encounter for screening for human papillomavirus (HPV): Secondary | ICD-10-CM

## 2016-06-05 DIAGNOSIS — Z01419 Encounter for gynecological examination (general) (routine) without abnormal findings: Secondary | ICD-10-CM | POA: Diagnosis not present

## 2016-06-05 DIAGNOSIS — I10 Essential (primary) hypertension: Secondary | ICD-10-CM

## 2016-06-05 LAB — POCT GLYCOSYLATED HEMOGLOBIN (HGB A1C): Hemoglobin A1C: 5.6

## 2016-06-05 LAB — GLUCOSE, CAPILLARY
Glucose-Capillary: 111 mg/dL — ABNORMAL HIGH (ref 65–99)
Glucose-Capillary: 49 mg/dL — ABNORMAL LOW (ref 65–99)

## 2016-06-05 MED ORDER — HUMALOG MIX 75/25 KWIKPEN (75-25) 100 UNIT/ML ~~LOC~~ SUPN: PEN_INJECTOR | SUBCUTANEOUS | 2 refills | Status: DC

## 2016-06-05 MED ORDER — DULOXETINE HCL 30 MG PO CPEP: 30.0000 mg | ORAL_CAPSULE | Freq: Every day | ORAL | 1 refills | Status: DC

## 2016-06-05 NOTE — Assessment & Plan Note (Signed)
BP Readings from Last 3 Encounters:  06/05/16 (!) 146/98  04/30/16 129/63  04/27/16 (!) 142/70   A: Her BP today is above goal.  She is prescribed HCTZ 12.5mg  daily and atenolol 25mg  daily.  She reports no symptoms of blurred vision, headache, CP, SOB.  She does report that every 3 days or so her ankles swell a little and she attributes this to her atenolol.  As a result, she has not taken this medication today and has been somewhat inconsistence with her adherence.  She takes HCTZ without issue.  She recently had angioedema from lisinopril.  She reports a high sodium diet.  She does not elevate her legs or use compression stockings.  P: - continue current medications as noted above. - RTC in 2 weeks to recheck her BP and encouraged her to take her medications as prescribed before making any changes.  Would consider increasing her HCTZ to 25 mg if remains elevated - recommended elevation of feet and compression stockings. - provided information on DASH eating plan

## 2016-06-05 NOTE — Progress Notes (Addendum)
Patient ID: Rose [name], female   DOB: 10/10/1955, 61 y.o.   MRN: [id number]  I saw and evaluated the patient.  I personally confirmed the key portions of Dr. Alcario Drought history and exam and reviewed pertinent patient test results.  The assessment, diagnosis, and plan were formulated together and I agree with the documentation in the resident's note.  We will continue to work on weaning off of the insulin given the very low dose she requires and excessively tight control on her current regimen.

## 2016-06-05 NOTE — Assessment & Plan Note (Signed)
A: CKD 3 stable.  Last creatinine recently checked in January 2018 with value of 1.3 near her baseline.  P: - continue to follow - consider nephrology referral if needed in the future.

## 2016-06-05 NOTE — Assessment & Plan Note (Signed)
Lab Results  Component Value Date   HGBA1C 5.6 06/05/2016    A:  Her DM continues to be overtreated on metformin 1000mg  BID and Humalog 75/25 5 units in the morning and 4 units in the evening.  She is hesitant to coming off insulin because she is scared of what her sugars might do.  P: - continue metformin 1000mg  BID - decrease Humalog 75/25 to 3 units BID - repeat A1c in 3 months.  If A1c tolerates reduced insulin dosing, could consider discontinuing metformin vs insulin if she is agreeable - due for eye exam - urine microalbumin obtained today

## 2016-06-05 NOTE — Assessment & Plan Note (Signed)
A/P: - Pap smear was done today - Urine microalbumin was obtained today - She is due for eye exam

## 2016-06-05 NOTE — Progress Notes (Addendum)
CC: here for DM, HTN, and Pap smear  HPI:  [name] is a 61 y.o. woman with a past medical history listed below here today for follow up of her DM and HTN.  Also here for Pap smear.  For details of today's visit and the status of her chronic medical issues please refer to the assessment and plan.   Past Medical History:  Diagnosis Date  . Anxiety   . Arthritis   . Asthma   . Chronic back pain   . Depression   . Diabetes mellitus   . Edema of lower extremity history cellulitis jan 2013  . Frequency of urination   . Hep C w/o coma, chronic (Rose [name])   . History of chronic bronchitis   . History of drug abuse states quit herion in 2006  . Hypertension   . Methadone maintenance therapy patient ([place])   . Nocturia   . Tendonitis   . Urge urinary incontinence     Review of Systems:  Please see pertinent ROS reviewed in HPI and problem based charting.   Physical Exam:  Vitals:   06/05/16 1335  BP: (!) 146/98  Pulse: 74  Temp: 98 F (36.7 C)  TempSrc: Oral  SpO2: 100%  Weight: 156 lb 12.8 oz (71.1 kg)  Height: 5\' 3"  (1.6 m)   Physical Exam  Constitutional: She is oriented to person, place, and time and well-developed, well-nourished, and in no distress.  HENT:  Head: Normocephalic and atraumatic.  Eyes: EOM are normal.  Pulmonary/Chest: Effort normal.  Genitourinary: Vagina normal, uterus normal and cervix normal. No vaginal discharge found.  Musculoskeletal: She exhibits no edema.  Neurological: She is alert and oriented to person, place, and time.  Psychiatric: Mood and affect normal.     Assessment & Plan:   See Encounters Tab for problem based charting.  Patient seen with Dr. Eppie Gibson.  HTN (hypertension) BP Readings from Last 3 Encounters:  06/05/16 (!) 146/98  04/30/16 129/63  04/27/16 (!) 142/70   A: Her BP today is above goal.  She is prescribed HCTZ 12.5mg  daily and atenolol 25mg  daily.  She reports no symptoms of blurred vision, headache,  CP, SOB.  She does report that every 3 days or so her ankles swell a little and she attributes this to her atenolol.  As a result, she has not taken this medication today and has been somewhat inconsistence with her adherence.  She takes HCTZ without issue.  She recently had angioedema from lisinopril.  She reports a high sodium diet.  She does not elevate her legs or use compression stockings.  P: - continue current medications as noted above. - RTC in 2 weeks to recheck her BP and encouraged her to take her medications as prescribed before making any changes.  Would consider increasing her HCTZ to 25 mg if remains elevated - recommended elevation of feet and compression stockings. - provided information on DASH eating plan  Diabetes mellitus with complication, with long-term current use of insulin (HCC) Lab Results  Component Value Date   HGBA1C 5.6 06/05/2016    A:  Her DM continues to be overtreated on metformin 1000mg  BID and Humalog 75/25 5 units in the morning and 4 units in the evening.  She is hesitant to coming off insulin because she is scared of what her sugars might do.  P: - continue metformin 1000mg  BID - decrease Humalog 75/25 to 3 units BID - repeat A1c in 3 months.  If A1c  tolerates reduced insulin dosing, could consider discontinuing metformin vs insulin if she is agreeable - due for eye exam - urine microalbumin obtained today  Chronic renal insufficiency A: CKD 3 stable.  Last creatinine recently checked in January 2018 with value of 1.3 near her baseline.  P: - continue to follow - consider nephrology referral if needed in the future.  Preventative health care A/P: - Pap smear was done today - Urine microalbumin was obtained today - She is due for eye exam  Lymphocytosis A: Absolute lymphocytosis seen on CBC with differential during recent hospitalization for ACE inhibitor induced angioedema.  P:  - repeat CBC obtained today.  Further management pending  those results.  Addendum 10:18 AM 06/06/2016 CBC resulted with absolute lymphocyte count of 4.7 decreased from 8.4 in January.  Review of previous CBCs indicate low level lymphocytosis since approximately 2009.  4000 appears to be around her baseline level.  She will return to clinic in 3 months for routine follow up.  Plan to monitor her CBC closely in case of impending lymphoproliferative disorder.

## 2016-06-05 NOTE — Progress Notes (Signed)
Hypoglycemic Event  CBG: 49  Treatment: 1 tube of Glutose 15 gel and 3 glucose tabs.  Symptoms:  Asymptomatic; "I feel fine".  Follow-up CBG: Time: 1413PM CBG Result:111  Possible Reasons for Event: Stated it had been 3 hrs since last eaten.  Comments/MD notified:yes, Dr Gillis Santa, Daylene Posey

## 2016-06-05 NOTE — Assessment & Plan Note (Addendum)
A: Absolute lymphocytosis seen on CBC with differential during recent hospitalization for ACE inhibitor induced angioedema.  P:  - repeat CBC obtained today.  Further management pending those results.  Addendum 10:18 AM 06/06/2016 CBC resulted with absolute lymphocyte count of 4.7 decreased from 8.4 in January.  Review of previous CBCs indicate low level lymphocytosis since approximately 2009.  4000 appears to be around her baseline level.  She will return to clinic in 3 months for routine follow up.  Plan to monitor her CBC closely in case of impending lymphoproliferative disorder.

## 2016-06-05 NOTE — Patient Instructions (Addendum)
Thank you for coming to see me today. It was a pleasure. Today we talked about:   Diabetes: lets decrease your insulin to 3 units in the AM and 3 units in the PM.  Continue with metformin.  Blood pressure: please follow up in 2 weeks for this.  Continue your current medications.  See attached for information on low salt diet.  Use compression stockings and elevate your legs for your ankle swelling.  We did your pap smear today.  We also checked some labs today.  I will let you know if anything is abnormal.   Please follow-up with [place] in 2 weeks to recheck your BP.  If you have any questions or concerns, please do not hesitate to call the office at [phone].  Take Care,   Jule Ser, DO  DASH Eating Plan DASH stands for "Dietary Approaches to Stop Hypertension." The DASH eating plan is a healthy eating plan that has been shown to reduce high blood pressure (hypertension). It may also reduce your risk for type 2 diabetes, heart disease, and stroke. The DASH eating plan may also help with weight loss. What are tips for following this plan? General guidelines   Avoid eating more than 2,300 mg (milligrams) of salt (sodium) a day. If you have hypertension, you may need to reduce your sodium intake to 1,500 mg a day.  Limit alcohol intake to no more than 1 drink a day for nonpregnant women and 2 drinks a day for men. One drink equals 12 oz of beer, 5 oz of wine, or 1 oz of hard liquor.  Work with your health care provider to maintain a healthy body weight or to lose weight. Ask what an ideal weight is for you.  Get at least 30 minutes of exercise that causes your heart to beat faster (aerobic exercise) most days of the week. Activities may include walking, swimming, or biking.  Work with your health care provider or diet and nutrition specialist (dietitian) to adjust your eating plan to your individual calorie needs. Reading food labels   Check food labels for the amount of sodium  per serving. Choose foods with less than 5 percent of the Daily Value of sodium. Generally, foods with less than 300 mg of sodium per serving fit into this eating plan.  To find whole grains, look for the word "whole" as the first word in the ingredient list. Shopping   Buy products labeled as "low-sodium" or "no salt added."  Buy fresh foods. Avoid canned foods and premade or frozen meals. Cooking   Avoid adding salt when cooking. Use salt-free seasonings or herbs instead of table salt or sea salt. Check with your health care provider or pharmacist before using salt substitutes.  Do not fry foods. Cook foods using healthy methods such as baking, boiling, grilling, and broiling instead.  Cook with heart-healthy oils, such as olive, canola, soybean, or sunflower oil. Meal planning    Eat a balanced diet that includes:  5 or more servings of fruits and vegetables each day. At each meal, try to fill half of your plate with fruits and vegetables.  Up to 6-8 servings of whole grains each day.  Less than 6 oz of lean meat, poultry, or fish each day. A 3-oz serving of meat is about the same size as a deck of cards. One egg equals 1 oz.  2 servings of low-fat dairy each day.  A serving of nuts, seeds, or beans 5 times each week.  Heart-healthy fats. Healthy fats called Omega-3 fatty acids are found in foods such as flaxseeds and coldwater fish, like sardines, salmon, and mackerel.  Limit how much you eat of the following:  Canned or prepackaged foods.  Food that is high in trans fat, such as fried foods.  Food that is high in saturated fat, such as fatty meat.  Sweets, desserts, sugary drinks, and other foods with added sugar.  Full-fat dairy products.  Do not salt foods before eating.  Try to eat at least 2 vegetarian meals each week.  Eat more home-cooked food and less restaurant, buffet, and fast food.  When eating at a restaurant, ask that your food be prepared with less  salt or no salt, if possible. What foods are recommended? The items listed may not be a complete list. Talk with your dietitian about what dietary choices are best for you. Grains  Whole-grain or whole-wheat bread. Whole-grain or whole-wheat pasta. Brown rice. [place]. Bulgur. Whole-grain and low-sodium cereals. Pita bread. Low-fat, low-sodium crackers. Whole-wheat flour tortillas. Vegetables  Fresh or frozen vegetables (raw, steamed, roasted, or grilled). Low-sodium or reduced-sodium tomato and vegetable juice. Low-sodium or reduced-sodium tomato sauce and tomato paste. Low-sodium or reduced-sodium canned vegetables. Fruits  All fresh, dried, or frozen fruit. Canned fruit in natural juice (without added sugar). Meat and other protein foods  Skinless chicken or [place]. Ground chicken or [place]. Pork with fat trimmed off. Fish and seafood. Egg whites. Dried beans, peas, or lentils. Unsalted nuts, nut butters, and seeds. Unsalted canned beans. Lean cuts of beef with fat trimmed off. Low-sodium, lean deli meat. Dairy  Low-fat (1%) or fat-free (skim) milk. Fat-free, low-fat, or reduced-fat cheeses. Nonfat, low-sodium ricotta or cottage cheese. Low-fat or nonfat yogurt. Low-fat, low-sodium cheese. Fats and oils  Soft margarine without trans fats. Vegetable oil. Low-fat, reduced-fat, or light mayonnaise and salad dressings (reduced-sodium). Canola, safflower, olive, soybean, and sunflower oils. Avocado. Seasoning and other foods  Herbs. Spices. Seasoning mixes without salt. Unsalted popcorn and pretzels. Fat-free sweets. What foods are not recommended? The items listed may not be a complete list. Talk with your dietitian about what dietary choices are best for you. Grains  Baked goods made with fat, such as croissants, muffins, or some breads. Dry pasta or rice meal packs. Vegetables  Creamed or fried vegetables. Vegetables in a cheese sauce. Regular canned vegetables (not low-sodium or  reduced-sodium). Regular canned tomato sauce and paste (not low-sodium or reduced-sodium). Regular tomato and vegetable juice (not low-sodium or reduced-sodium). Angie Fava. Olives. Fruits  Canned fruit in a light or heavy syrup. Fried fruit. Fruit in cream or butter sauce. Meat and other protein foods  Fatty cuts of meat. Ribs. Fried meat. Berniece Salines. Sausage. Bologna and other processed lunch meats. Salami. Fatback. Hotdogs. Bratwurst. Salted nuts and seeds. Canned beans with added salt. Canned or smoked fish. Whole eggs or egg yolks. Chicken or [place] with skin. Dairy  Whole or 2% milk, cream, and half-and-half. Whole or full-fat cream cheese. Whole-fat or sweetened yogurt. Full-fat cheese. Nondairy creamers. Whipped toppings. Processed cheese and cheese spreads. Fats and oils  Butter. Stick margarine. Lard. Shortening. Ghee. Bacon fat. Tropical oils, such as coconut, palm kernel, or palm oil. Seasoning and other foods  Salted popcorn and pretzels. Onion salt, garlic salt, seasoned salt, table salt, and sea salt. Worcestershire sauce. Tartar sauce. Barbecue sauce. Teriyaki sauce. Soy sauce, including reduced-sodium. Steak sauce. Canned and packaged gravies. Fish sauce. Oyster sauce. Cocktail sauce. Horseradish that you find on the  shelf. Ketchup. Mustard. Meat flavorings and tenderizers. Bouillon cubes. Hot sauce and Tabasco sauce. Premade or packaged marinades. Premade or packaged taco seasonings. Relishes. Regular salad dressings. Where to find more information:  National Heart, Lung, and [place]: https://wilson-eaton.com/  American Heart Association: www.heart.org Summary  The DASH eating plan is a healthy eating plan that has been shown to reduce high blood pressure (hypertension). It may also reduce your risk for type 2 diabetes, heart disease, and stroke.  With the DASH eating plan, you should limit salt (sodium) intake to 2,300 mg a day. If you have hypertension, you may need to reduce your  sodium intake to 1,500 mg a day.  When on the DASH eating plan, aim to eat more fresh fruits and vegetables, whole grains, lean proteins, low-fat dairy, and heart-healthy fats.  Work with your health care provider or diet and nutrition specialist (dietitian) to adjust your eating plan to your individual calorie needs. This information is not intended to replace advice given to you by your health care provider. Make sure you discuss any questions you have with your health care provider. Document Released: 03/06/2011 Document Revised: 03/10/2016 Document Reviewed: 03/10/2016 Elsevier Interactive Patient Education  2017 [employer].

## 2016-06-06 LAB — CBC WITH DIFFERENTIAL/PLATELET
Basophils Absolute: 0 10*3/uL (ref 0.0–0.2)
Basos: 0 %
EOS (ABSOLUTE): 0.2 10*3/uL (ref 0.0–0.4)
Eos: 1 %
Hematocrit: 37.3 % (ref 34.0–46.6)
Hemoglobin: 12 g/dL (ref 11.1–15.9)
Immature Grans (Abs): 0 10*3/uL (ref 0.0–0.1)
Immature Granulocytes: 0 %
Lymphocytes Absolute: 4.7 10*3/uL — ABNORMAL HIGH (ref 0.7–3.1)
Lymphs: 40 %
MCH: 26.7 pg (ref 26.6–33.0)
MCHC: 32.2 g/dL (ref 31.5–35.7)
MCV: 83 fL (ref 79–97)
Monocytes Absolute: 0.7 10*3/uL (ref 0.1–0.9)
Monocytes: 6 %
Neutrophils Absolute: 6.3 10*3/uL (ref 1.4–7.0)
Neutrophils: 53 %
Platelets: 171 10*3/uL (ref 150–379)
RBC: 4.49 x10E6/uL (ref 3.77–5.28)
RDW: 12.9 % (ref 12.3–15.4)
WBC: 11.8 10*3/uL — ABNORMAL HIGH (ref 3.4–10.8)

## 2016-06-06 LAB — MICROALBUMIN / CREATININE URINE RATIO
Creatinine, Urine: 51.3 mg/dL
Microalb/Creat Ratio: 35.9 mg/g creat — ABNORMAL HIGH (ref 0.0–30.0)
Microalbumin, Urine: 18.4 ug/mL

## 2016-06-10 LAB — CYTOLOGY - PAP
Adequacy: ABSENT
Diagnosis: NEGATIVE
HPV: DETECTED — AB

## 2016-06-11 DIAGNOSIS — B977 Papillomavirus as the cause of diseases classified elsewhere: Secondary | ICD-10-CM

## 2016-06-11 HISTORY — DX: Papillomavirus as the cause of diseases classified elsewhere: B97.7

## 2016-06-11 MED ORDER — FLUCONAZOLE 150 MG PO TABS: ORAL_TABLET | ORAL | 0 refills | Status: DC

## 2016-06-11 NOTE — Assessment & Plan Note (Signed)
Her screening Pap smear was negative for intraepithelial lesions or malignancy.  However, it detected high risk HPV.  Also, presence of Candida spp was identified.  P: - I have called the patient and discussed the results - Diflucan 150mg  PO x 2 doses sent to pharmacy - referral to Ob/Gyn placed to consider colposcopy

## 2016-06-11 NOTE — Addendum Note (Signed)
Addended by: Mignon Pine on: 06/11/2016 11:42 PM   Modules accepted: Orders

## 2016-06-18 ENCOUNTER — Telehealth: Payer: Self-pay | Admitting: Internal Medicine

## 2016-06-18 NOTE — Telephone Encounter (Signed)
APT. REMINDER CALL, LMTCB °

## 2016-06-19 ENCOUNTER — Ambulatory Visit (INDEPENDENT_AMBULATORY_CARE_PROVIDER_SITE_OTHER): Payer: Medicare Other | Admitting: Internal Medicine

## 2016-06-19 VITALS — BP 138/71 | HR 66 | Temp 98.1°F | Ht 63.0 in | Wt 158.6 lb

## 2016-06-19 DIAGNOSIS — Z79899 Other long term (current) drug therapy: Secondary | ICD-10-CM

## 2016-06-19 DIAGNOSIS — I10 Essential (primary) hypertension: Secondary | ICD-10-CM

## 2016-06-19 DIAGNOSIS — E1169 Type 2 diabetes mellitus with other specified complication: Secondary | ICD-10-CM | POA: Diagnosis not present

## 2016-06-19 DIAGNOSIS — F17210 Nicotine dependence, cigarettes, uncomplicated: Secondary | ICD-10-CM | POA: Diagnosis not present

## 2016-06-19 DIAGNOSIS — E785 Hyperlipidemia, unspecified: Secondary | ICD-10-CM

## 2016-06-19 DIAGNOSIS — E784 Other hyperlipidemia: Secondary | ICD-10-CM | POA: Diagnosis not present

## 2016-06-19 MED ORDER — ATORVASTATIN CALCIUM 40 MG PO TABS: 40.0000 mg | ORAL_TABLET | Freq: Every day | ORAL | 3 refills | Status: DC

## 2016-06-19 MED ORDER — HYDROCHLOROTHIAZIDE 25 MG PO TABS: 25.0000 mg | ORAL_TABLET | Freq: Every day | ORAL | 6 refills | Status: DC

## 2016-06-19 NOTE — Patient Instructions (Signed)
Start taking hydrochlorothiazide 25mg  once daily.

## 2016-06-19 NOTE — Progress Notes (Signed)
   CC: BP  HPI:  [name] is a 61 y.o. with PMHx as outlined below who presents to clinic for HTN follow up. Please see problem list for further details of patient's chronic medical issues.    Past Medical History:  Diagnosis Date  . Anxiety   . Arthritis   . Asthma   . Chronic back pain   . Depression   . Diabetes mellitus   . Edema of lower extremity history cellulitis jan 2013  . Frequency of urination   . Hep C w/o coma, chronic ([place])   . History of chronic bronchitis   . History of drug abuse states quit herion in 2006  . Hypertension   . Methadone maintenance therapy patient ([place])   . Nocturia   . Tendonitis   . Urge urinary incontinence     Review of Systems:  Denies muscle aches, HA, dysuria, chest pain, SOB.   Physical Exam:  Vitals:   06/19/16 1017  BP: 138/71  Pulse: 66  Temp: 98.1 F (36.7 C)  TempSrc: Oral  SpO2: 99%  Weight: 158 lb 9.6 oz (71.9 kg)  Height: 5\' 3"  (1.6 m)   Gen: NAD , well nourished Cardiac: RRR, no murmurs/r/g GI: active bowel sounds, non TTP, soft   Assessment & Plan:   See Encounters Tab for problem based charting.  Patient discussed with Dr. Beryle Beams

## 2016-06-19 NOTE — Progress Notes (Signed)
Medicine attending: Medical history, presenting problems, physical findings, and medications, reviewed with resident physician Dr Diana Truong on the day of the patient visit and I concur with her evaluation and management plan. 

## 2016-06-19 NOTE — Assessment & Plan Note (Signed)
Requesting refill of lipitor 40mg . Tolerating this med well w/o any muscle aches. Refilled lipitor 40mg .

## 2016-06-19 NOTE — Assessment & Plan Note (Signed)
A: BP near goal of <130/80 per new 2017 update w hx of DM. On hctz 12.5mg  and atenolol 25mg . Tongue swelling with lisinopril in the past.   P. Increase hctz to 25mg  daily. f/u in 2 months. Consider BMET at this visit.

## 2016-06-25 ENCOUNTER — Telehealth: Payer: Self-pay | Admitting: *Deleted

## 2016-06-25 NOTE — Telephone Encounter (Signed)
Correct. Thanks!  Dr. Hulen Luster

## 2016-06-25 NOTE — Telephone Encounter (Signed)
Patient's dtr wants to know if patient should continue atenolol 25 mg with the increased dose of HCTZ 25 mg (per OV 3/22 with Dr. Hulen Luster hctz was increased). There was no order to d/c atenolol so informed dtr that patient should take both meds. Is this correct? Thanks

## 2016-06-25 NOTE — Telephone Encounter (Signed)
Daughter requesting patient to be seen @ [facility] outpatient clinic for OB-Gyn referral. [place] made aware.

## 2016-07-09 ENCOUNTER — Ambulatory Visit (INDEPENDENT_AMBULATORY_CARE_PROVIDER_SITE_OTHER): Payer: Medicare Other | Admitting: Podiatry

## 2016-07-09 DIAGNOSIS — B351 Tinea unguium: Secondary | ICD-10-CM

## 2016-07-09 DIAGNOSIS — M79674 Pain in right toe(s): Secondary | ICD-10-CM

## 2016-07-09 DIAGNOSIS — E0842 Diabetes mellitus due to underlying condition with diabetic polyneuropathy: Secondary | ICD-10-CM

## 2016-07-09 DIAGNOSIS — M79675 Pain in left toe(s): Secondary | ICD-10-CM

## 2016-07-09 NOTE — Progress Notes (Signed)
Patient ID: Rose [name], female   DOB: 01/15/1956, 61 y.o.   MRN: [id number]   Subjective:   This patient presents today requesting a diabetic foot examination because of a burning, throbbing, tenderness and swelling that she has in her feet on and off weightbearing over a multiple month period of time. Patient states that she has neuropathy causing the symptoms. She's had no specific treatment for these symptoms. Also, patient is complaining that her toenails are long and thick her uncomfortable when walking wearing shoes and requests nail debridement.  Patient is diabetic and denies history of ulceration, claudication an amputation Patient is a current smoker  Objective: Orientated 3  Vascular: No peripheral edema bilaterally DP and PT pulses 2/4 bilaterally Capillary reflex immediate bilaterally  Neurological: Sensation to 10 g monofilament wire intact 4/5 right 2/5 left Vibratory sensation reactive bilaterally Ankle reflex equal and reactive bilaterally  Dermatological: No open skin lesions bilaterally The toenails are elongated, brittle, deformed, discolored and tender direct palpation 6-10  Musculoskeletal: Limited range of motion subtalar, midtarsal joints bilaterally Manual motor testing: Dorsi flexion, plantar flexion, inversion, eversion 5/5 bilaterally Patient uses a cane when walking  Assessment: Insulin control diabetic with history of neuropathy History of drug abuse History of hepatitis C  Plan: Debridement toenails 6-10 mechanically and electrically without any bleeding  Reappoint 3 months Symptomatic onychomycoses 6-10

## 2016-07-09 NOTE — Patient Instructions (Signed)

## 2016-07-10 ENCOUNTER — Telehealth: Payer: Self-pay

## 2016-07-10 NOTE — Telephone Encounter (Signed)
Entered in error

## 2016-08-21 ENCOUNTER — Telehealth: Payer: Self-pay | Admitting: *Deleted

## 2016-08-21 ENCOUNTER — Other Ambulatory Visit: Payer: Self-pay | Admitting: Internal Medicine

## 2016-08-21 ENCOUNTER — Ambulatory Visit (INDEPENDENT_AMBULATORY_CARE_PROVIDER_SITE_OTHER): Payer: Medicare Other | Admitting: Internal Medicine

## 2016-08-21 VITALS — BP 144/94

## 2016-08-21 DIAGNOSIS — Z794 Long term (current) use of insulin: Secondary | ICD-10-CM

## 2016-08-21 DIAGNOSIS — I10 Essential (primary) hypertension: Secondary | ICD-10-CM | POA: Diagnosis not present

## 2016-08-21 DIAGNOSIS — I129 Hypertensive chronic kidney disease with stage 1 through stage 4 chronic kidney disease, or unspecified chronic kidney disease: Secondary | ICD-10-CM | POA: Diagnosis not present

## 2016-08-21 DIAGNOSIS — F17210 Nicotine dependence, cigarettes, uncomplicated: Secondary | ICD-10-CM

## 2016-08-21 DIAGNOSIS — M79672 Pain in left foot: Secondary | ICD-10-CM

## 2016-08-21 DIAGNOSIS — E1122 Type 2 diabetes mellitus with diabetic chronic kidney disease: Secondary | ICD-10-CM

## 2016-08-21 DIAGNOSIS — E118 Type 2 diabetes mellitus with unspecified complications: Secondary | ICD-10-CM

## 2016-08-21 DIAGNOSIS — N189 Chronic kidney disease, unspecified: Secondary | ICD-10-CM

## 2016-08-21 DIAGNOSIS — M79671 Pain in right foot: Secondary | ICD-10-CM

## 2016-08-21 DIAGNOSIS — F17200 Nicotine dependence, unspecified, uncomplicated: Secondary | ICD-10-CM

## 2016-08-21 MED ORDER — NICOTINE 7 MG/24HR TD PT24: MEDICATED_PATCH | TRANSDERMAL | 0 refills | Status: DC

## 2016-08-21 MED ORDER — METFORMIN HCL 1000 MG PO TABS: 1000.0000 mg | ORAL_TABLET | Freq: Two times a day (BID) | ORAL | 1 refills | Status: DC

## 2016-08-21 MED ORDER — BACLOFEN 10 MG PO TABS: ORAL_TABLET | ORAL | 0 refills | Status: DC

## 2016-08-21 MED ORDER — NICOTINE POLACRILEX 2 MG MT GUM: CHEWING_GUM | OROMUCOSAL | 0 refills | Status: DC

## 2016-08-21 MED ORDER — NICOTINE 14 MG/24HR TD PT24: MEDICATED_PATCH | TRANSDERMAL | 0 refills | Status: DC

## 2016-08-21 NOTE — Progress Notes (Signed)
Duplicate note

## 2016-08-21 NOTE — Patient Instructions (Addendum)
Thank you for coming to see me today. It was a pleasure. Today we talked about:   Foot pain: I would recommend calling your foot doctor and moving up your appointment that is scheduled in July.  Blood Pressure: Take the following blood pressure medications >> 1. Atenolol 25mg  daily 2. HCTZ 25mg  daily  Please keep a blood pressure log and write down the numbers.  Bring this to your next appointment.  Keep an eye on your ear drainage and let us know if it gets any worse.   We will check labs today.  I'll let you know if abnormal.  Please follow-up with me in 1-2 months.  If you have any questions or concerns, please do not hesitate to call the office at [phone].  Take Care,   Jule Ser, DO    DASH Eating Plan DASH stands for "Dietary Approaches to Stop Hypertension." The DASH eating plan is a healthy eating plan that has been shown to reduce high blood pressure (hypertension). It may also reduce your risk for type 2 diabetes, heart disease, and stroke. The DASH eating plan may also help with weight loss. What are tips for following this plan? General guidelines   Avoid eating more than 2,300 mg (milligrams) of salt (sodium) a day. If you have hypertension, you may need to reduce your sodium intake to 1,500 mg a day.  Limit alcohol intake to no more than 1 drink a day for nonpregnant women and 2 drinks a day for men. One drink equals 12 oz of beer, 5 oz of wine, or 1 oz of hard liquor.  Work with your health care provider to maintain a healthy body weight or to lose weight. Ask what an ideal weight is for you.  Get at least 30 minutes of exercise that causes your heart to beat faster (aerobic exercise) most days of the week. Activities may include walking, swimming, or biking.  Work with your health care provider or diet and nutrition specialist (dietitian) to adjust your eating plan to your individual calorie needs. Reading food labels   Check food labels for the  amount of sodium per serving. Choose foods with less than 5 percent of the Daily Value of sodium. Generally, foods with less than 300 mg of sodium per serving fit into this eating plan.  To find whole grains, look for the word "whole" as the first word in the ingredient list. Shopping   Buy products labeled as "low-sodium" or "no salt added."  Buy fresh foods. Avoid canned foods and premade or frozen meals. Cooking   Avoid adding salt when cooking. Use salt-free seasonings or herbs instead of table salt or sea salt. Check with your health care provider or pharmacist before using salt substitutes.  Do not fry foods. Cook foods using healthy methods such as baking, boiling, grilling, and broiling instead.  Cook with heart-healthy oils, such as olive, canola, soybean, or sunflower oil. Meal planning    Eat a balanced diet that includes:  5 or more servings of fruits and vegetables each day. At each meal, try to fill half of your plate with fruits and vegetables.  Up to 6-8 servings of whole grains each day.  Less than 6 oz of lean meat, poultry, or fish each day. A 3-oz serving of meat is about the same size as a deck of cards. One egg equals 1 oz.  2 servings of low-fat dairy each day.  A serving of nuts, seeds, or beans 5 times  each week.  Heart-healthy fats. Healthy fats called Omega-3 fatty acids are found in foods such as flaxseeds and coldwater fish, like sardines, salmon, and mackerel.  Limit how much you eat of the following:  Canned or prepackaged foods.  Food that is high in trans fat, such as fried foods.  Food that is high in saturated fat, such as fatty meat.  Sweets, desserts, sugary drinks, and other foods with added sugar.  Full-fat dairy products.  Do not salt foods before eating.  Try to eat at least 2 vegetarian meals each week.  Eat more home-cooked food and less restaurant, buffet, and fast food.  When eating at a restaurant, ask that your food be  prepared with less salt or no salt, if possible. What foods are recommended? The items listed may not be a complete list. Talk with your dietitian about what dietary choices are best for you. Grains  Whole-grain or whole-wheat bread. Whole-grain or whole-wheat pasta. Brown rice. [place]. Bulgur. Whole-grain and low-sodium cereals. Pita bread. Low-fat, low-sodium crackers. Whole-wheat flour tortillas. Vegetables  Fresh or frozen vegetables (raw, steamed, roasted, or grilled). Low-sodium or reduced-sodium tomato and vegetable juice. Low-sodium or reduced-sodium tomato sauce and tomato paste. Low-sodium or reduced-sodium canned vegetables. Fruits  All fresh, dried, or frozen fruit. Canned fruit in natural juice (without added sugar). Meat and other protein foods  Skinless chicken or [place]. Ground chicken or [place]. Pork with fat trimmed off. Fish and seafood. Egg whites. Dried beans, peas, or lentils. Unsalted nuts, nut butters, and seeds. Unsalted canned beans. Lean cuts of beef with fat trimmed off. Low-sodium, lean deli meat. Dairy  Low-fat (1%) or fat-free (skim) milk. Fat-free, low-fat, or reduced-fat cheeses. Nonfat, low-sodium ricotta or cottage cheese. Low-fat or nonfat yogurt. Low-fat, low-sodium cheese. Fats and oils  Soft margarine without trans fats. Vegetable oil. Low-fat, reduced-fat, or light mayonnaise and salad dressings (reduced-sodium). Canola, safflower, olive, soybean, and sunflower oils. Avocado. Seasoning and other foods  Herbs. Spices. Seasoning mixes without salt. Unsalted popcorn and pretzels. Fat-free sweets. What foods are not recommended? The items listed may not be a complete list. Talk with your dietitian about what dietary choices are best for you. Grains  Baked goods made with fat, such as croissants, muffins, or some breads. Dry pasta or rice meal packs. Vegetables  Creamed or fried vegetables. Vegetables in a cheese sauce. Regular canned vegetables (not  low-sodium or reduced-sodium). Regular canned tomato sauce and paste (not low-sodium or reduced-sodium). Regular tomato and vegetable juice (not low-sodium or reduced-sodium). Angie Fava. Olives. Fruits  Canned fruit in a light or heavy syrup. Fried fruit. Fruit in cream or butter sauce. Meat and other protein foods  Fatty cuts of meat. Ribs. Fried meat. Berniece Salines. Sausage. Bologna and other processed lunch meats. Salami. Fatback. Hotdogs. Bratwurst. Salted nuts and seeds. Canned beans with added salt. Canned or smoked fish. Whole eggs or egg yolks. Chicken or [place] with skin. Dairy  Whole or 2% milk, cream, and half-and-half. Whole or full-fat cream cheese. Whole-fat or sweetened yogurt. Full-fat cheese. Nondairy creamers. Whipped toppings. Processed cheese and cheese spreads. Fats and oils  Butter. Stick margarine. Lard. Shortening. Ghee. Bacon fat. Tropical oils, such as coconut, palm kernel, or palm oil. Seasoning and other foods  Salted popcorn and pretzels. Onion salt, garlic salt, seasoned salt, table salt, and sea salt. Worcestershire sauce. Tartar sauce. Barbecue sauce. Teriyaki sauce. Soy sauce, including reduced-sodium. Steak sauce. Canned and packaged gravies. Fish sauce. Oyster sauce. Cocktail sauce. Horseradish that you  find on the shelf. Ketchup. Mustard. Meat flavorings and tenderizers. Bouillon cubes. Hot sauce and Tabasco sauce. Premade or packaged marinades. Premade or packaged taco seasonings. Relishes. Regular salad dressings. Where to find more information:  National Heart, Lung, and [place]: https://wilson-eaton.com/  American Heart Association: www.heart.org Summary  The DASH eating plan is a healthy eating plan that has been shown to reduce high blood pressure (hypertension). It may also reduce your risk for type 2 diabetes, heart disease, and stroke.  With the DASH eating plan, you should limit salt (sodium) intake to 2,300 mg a day. If you have hypertension, you may need to  reduce your sodium intake to 1,500 mg a day.  When on the DASH eating plan, aim to eat more fresh fruits and vegetables, whole grains, lean proteins, low-fat dairy, and heart-healthy fats.  Work with your health care provider or diet and nutrition specialist (dietitian) to adjust your eating plan to your individual calorie needs. This information is not intended to replace advice given to you by your health care provider. Make sure you discuss any questions you have with your health care provider. Document Released: 03/06/2011 Document Revised: 03/10/2016 Document Reviewed: 03/10/2016 Elsevier Interactive Patient Education  2017 [employer].

## 2016-08-21 NOTE — Telephone Encounter (Signed)
[facility] pharmacist calls for a PA on nicotine patches, will forward to gladysh.

## 2016-08-21 NOTE — Progress Notes (Signed)
CC: here for HTN f/u   HPI:  Rose [name] is a 61 y.o. woman with a past medical history listed below here today for follow up of her HTN.  For details of today's visit and the status of her chronic medical issues please refer to the assessment and plan.   Past Medical History:  Diagnosis Date  . Anxiety   . Arthritis   . Asthma   . Chronic back pain   . Depression   . Diabetes mellitus   . Edema of lower extremity history cellulitis jan 2013  . Frequency of urination   . Hep C w/o coma, chronic ([place])   . History of chronic bronchitis   . History of drug abuse states quit herion in 2006  . Hypertension   . Methadone maintenance therapy patient ([place])   . Nocturia   . Tendonitis   . Urge urinary incontinence     Review of Systems:  Please see pertinent ROS reviewed in HPI and problem based charting.   Physical Exam:  Vitals:   08/21/16 1347  BP: (!) 144/94   General: comfortable, sitting up in chair, NAD HEENT: [place]/AT,  EOMI, TM bilaterally clear without erythema or bulging Pulm: normal effort Ext: warm and well perfused, trace pedal edema, no obvious deformity of bilateral feet, no warmth or erythema, no bruising, no significant TTP Neuro: alert and oriented X3, cranial nerves II-XII grossly intact   Assessment & Plan:   See Encounters Tab for problem based charting.  Patient discussed with Dr. Eppie Gibson .  Pain in both feet Assessment: This is a new problem.  She reports bilateral foot soreness for 3 weeks.  Located at her heel and ball of 1st toe.  No pain with first morning steps.  Pain occurs after walking for about 30 minutes.  Relieved with rest.  Has not tried anything else for pain.  Went to [employer] store who recommended orthotics but patient declined due to $800 cost.  She describes as achy and similar to a bruise.  No calf pain or cramping.  No sharp or shooting pains similar to prior neuropathic discomfort.  Denies any trauma.  On exam, she has mild  pedal edema, no signs of trauma or infection, and her extremities are warm and well perfused.  She follows with Carl with an upcoming appointment in early July.  Plan: - Recommended she attempt to move her appointment up with her podiatrist as her discomfort sounds like she may benefit from some degree of orthotic or padding.  Tobacco use disorder Assessment: She continues to smoke about 4 cigarettes per day and is interested in patches to fully quit.  Plan: - She was provided with sample patches today with the assistance of our clinic pharmacist as well as prescription for NRT  Diabetes mellitus with complication, with long-term current use of insulin ([place]) Assessment: Too strict glycemic control with A1c 5.6 in March 2018.  She has tolerated her reduced Humalog dosing of 3mg  BID and continues to take metformin 1000mg  BID.  Her meter today has readings with 91% in range and she has not experienced any symptoms of hypoglycemia or hyperglycemia.  Plan: - RTC in 1-2 months.  Upon repeat A1c, if glycemic control remains strict we will attempt to discontinue her metformin vs insulin - continue current regimen for the time being.  HTN (hypertension) BP: (!) 144/94   Assessment:  Her BP today is near goal of <130/80.  There was some confusion  after her last visit on 06/19/2016 and she has since stopped atenolol while increasing her dose of HCTZ to 37.5mg  daily (has been taking a 25mg  and 12.5mg  tablet).    Plan: - discussed the regimen we want her to be on is HCTZ 25mg  daily and atenolol 25mg  daily.  She expresses understanding - RTC 1-2 months for BP check.  Encouraged her to keep a log at home and bring to next appointment - discussed DASH eating plan. - BMET today with stable CKD and normal electrolytes

## 2016-08-22 ENCOUNTER — Encounter: Payer: Self-pay | Admitting: Internal Medicine

## 2016-08-22 DIAGNOSIS — M79672 Pain in left foot: Secondary | ICD-10-CM

## 2016-08-22 DIAGNOSIS — M79671 Pain in right foot: Secondary | ICD-10-CM | POA: Insufficient documentation

## 2016-08-22 LAB — BMP8+ANION GAP
Anion Gap: 16 mmol/L (ref 10.0–18.0)
BUN/Creatinine Ratio: 14 (ref 9–23)
BUN: 19 mg/dL (ref 6–24)
CO2: 24 mmol/L (ref 18–29)
Calcium: 9.2 mg/dL (ref 8.7–10.2)
Chloride: 96 mmol/L (ref 96–106)
Creatinine, Ser: 1.35 mg/dL — ABNORMAL HIGH (ref 0.57–1.00)
GFR calc Af Amer: 50 mL/min/{1.73_m2} — ABNORMAL LOW (ref >59)
GFR calc non Af Amer: 43 mL/min/{1.73_m2} — ABNORMAL LOW (ref >59)
Glucose: 124 mg/dL — ABNORMAL HIGH (ref 65–99)
Potassium: 3.8 mmol/L (ref 3.5–5.2)
Sodium: 136 mmol/L (ref 134–144)

## 2016-08-22 MED ORDER — NICOTINE 7 MG/24HR TD PT24: MEDICATED_PATCH | TRANSDERMAL | 0 refills | Status: DC

## 2016-08-22 MED ORDER — NICOTINE 14 MG/24HR TD PT24: MEDICATED_PATCH | TRANSDERMAL | 0 refills | Status: DC

## 2016-08-22 MED ORDER — NICOTINE POLACRILEX 2 MG MT GUM: CHEWING_GUM | OROMUCOSAL | 0 refills | Status: DC

## 2016-08-22 NOTE — Addendum Note (Signed)
Addended by: Forde Dandy on: 08/22/2016 01:51 PM   Modules accepted: Orders

## 2016-08-22 NOTE — Assessment & Plan Note (Signed)
BP: (!) 144/94   Assessment:  Her BP today is near goal of <130/80.  There was some confusion after her last visit on 06/19/2016 and she has since stopped atenolol while increasing her dose of HCTZ to 37.5mg  daily (has been taking a 25mg  and 12.5mg  tablet).    Plan: - discussed the regimen we want her to be on is HCTZ 25mg  daily and atenolol 25mg  daily.  She expresses understanding - RTC 1-2 months for BP check.  Encouraged her to keep a log at home and bring to next appointment - discussed DASH eating plan. - BMET today with stable CKD and normal electrolytes

## 2016-08-22 NOTE — Progress Notes (Signed)
Case discussed with Dr. Wallace soon after the resident saw the patient.  We reviewed the resident's history and exam and pertinent patient test results.  I agree with the assessment, diagnosis and plan of care documented in the resident's note. 

## 2016-08-22 NOTE — Progress Notes (Signed)
[facility] pharmacist unable to get NRT to go through insurance. Transferred to [place] and spoke to pharmacist who successfully processed the NRT through insurance. Patient notified and verbalized understanding by repeat back. Advised to contact clinic if any questions/concerns.

## 2016-08-22 NOTE — Assessment & Plan Note (Signed)
Assessment: Too strict glycemic control with A1c 5.6 in March 2018.  She has tolerated her reduced Humalog dosing of 3mg  BID and continues to take metformin 1000mg  BID.  Her meter today has readings with 91% in range and she has not experienced any symptoms of hypoglycemia or hyperglycemia.  Plan: - RTC in 1-2 months.  Upon repeat A1c, if glycemic control remains strict we will attempt to discontinue her metformin vs insulin - continue current regimen for the time being.

## 2016-08-22 NOTE — Assessment & Plan Note (Addendum)
Assessment: She continues to smoke about 4 cigarettes per day and is interested in patches to fully quit.  Plan: - She was provided with sample patches today with the assistance of our clinic pharmacist as well as prescription for NRT

## 2016-08-22 NOTE — Assessment & Plan Note (Signed)
Assessment: This is a new problem.  She reports bilateral foot soreness for 3 weeks.  Located at her heel and ball of 1st toe.  No pain with first morning steps.  Pain occurs after walking for about 30 minutes.  Relieved with rest.  Has not tried anything else for pain.  Went to [employer] store who recommended orthotics but patient declined due to $800 cost.  She describes as achy and similar to a bruise.  No calf pain or cramping.  No sharp or shooting pains similar to prior neuropathic discomfort.  Denies any trauma.  On exam, she has mild pedal edema, no signs of trauma or infection, and her extremities are warm and well perfused.  She follows with [place] with an upcoming appointment in early July.  Plan: - Recommended she attempt to move her appointment up with her podiatrist as her discomfort sounds like she may benefit from some degree of orthotic or padding.

## 2016-08-29 MED FILL — NICOTINE 14 MG/24HR PATCH: 14 | 28 days supply | Qty: 28 | Fill #0

## 2016-08-29 MED FILL — SM NICOTINE 2 MG CHEWING GU: 2 | 30 days supply | Qty: 110 | Fill #0

## 2016-10-03 ENCOUNTER — Other Ambulatory Visit: Payer: Self-pay | Admitting: *Deleted

## 2016-10-03 MED ORDER — CETIRIZINE HCL 10 MG PO TABS: 10.0000 mg | ORAL_TABLET | Freq: Every day | ORAL | 11 refills | Status: DC

## 2016-10-08 ENCOUNTER — Ambulatory Visit: Payer: Medicare Other | Admitting: Podiatry

## 2016-10-30 ENCOUNTER — Ambulatory Visit (HOSPITAL_COMMUNITY)
Admission: RE | Admit: 2016-10-30 | Discharge: 2016-10-30 | Disposition: A | Discharge status: Home / Self Care | Payer: Medicare Other | Source: Ambulatory Visit | Attending: Internal Medicine | Admitting: Internal Medicine

## 2016-10-30 ENCOUNTER — Ambulatory Visit (INDEPENDENT_AMBULATORY_CARE_PROVIDER_SITE_OTHER): Payer: Medicare Other | Admitting: Internal Medicine

## 2016-10-30 ENCOUNTER — Encounter: Payer: Self-pay | Admitting: Internal Medicine

## 2016-10-30 VITALS — BP 154/72 | HR 72 | Temp 98.2°F | Ht 63.0 in | Wt 162.2 lb

## 2016-10-30 DIAGNOSIS — E118 Type 2 diabetes mellitus with unspecified complications: Secondary | ICD-10-CM | POA: Diagnosis not present

## 2016-10-30 DIAGNOSIS — R079 Chest pain, unspecified: Secondary | ICD-10-CM | POA: Diagnosis not present

## 2016-10-30 DIAGNOSIS — Z79899 Other long term (current) drug therapy: Secondary | ICD-10-CM

## 2016-10-30 DIAGNOSIS — F17210 Nicotine dependence, cigarettes, uncomplicated: Secondary | ICD-10-CM

## 2016-10-30 DIAGNOSIS — Z794 Long term (current) use of insulin: Secondary | ICD-10-CM

## 2016-10-30 DIAGNOSIS — I10 Essential (primary) hypertension: Secondary | ICD-10-CM | POA: Diagnosis not present

## 2016-10-30 DIAGNOSIS — Z8249 Family history of ischemic heart disease and other diseases of the circulatory system: Secondary | ICD-10-CM | POA: Diagnosis not present

## 2016-10-30 DIAGNOSIS — R0789 Other chest pain: Secondary | ICD-10-CM | POA: Insufficient documentation

## 2016-10-30 DIAGNOSIS — Z833 Family history of diabetes mellitus: Secondary | ICD-10-CM | POA: Diagnosis not present

## 2016-10-30 LAB — POCT GLYCOSYLATED HEMOGLOBIN (HGB A1C): Hemoglobin A1C: 5.8

## 2016-10-30 LAB — GLUCOSE, CAPILLARY: Glucose-Capillary: 183 mg/dL — ABNORMAL HIGH (ref 65–99)

## 2016-10-30 MED ORDER — VERAPAMIL HCL ER 180 MG PO TBCR: 180.0000 mg | EXTENDED_RELEASE_TABLET | Freq: Every day | ORAL | 11 refills | Status: DC

## 2016-10-30 MED ORDER — ATENOLOL 50 MG PO TABS: 25.0000 mg | ORAL_TABLET | Freq: Every day | ORAL | 1 refills | Status: DC

## 2016-10-30 MED ORDER — CETIRIZINE HCL 10 MG PO TABS: 10.0000 mg | ORAL_TABLET | Freq: Every day | ORAL | 11 refills | Status: DC

## 2016-10-30 NOTE — Assessment & Plan Note (Signed)
Assessment: This is a new complaint today.  Symptoms onset was 2 weeks ago and has been occurring every 3-4 days.  Initially, she thought it may be due to gas/indigestion although pain was not associated with food or eating.  Her symptoms were unrelieved by TUMS and Rolaids.  Her symptoms occur while at rest (i.e. Watching TV) but have also occurred while doing the dishes or other light household chores.  She describes as sharp, stabbing, left sided, lasting a couple seconds before easing off.  Other than TUMS/Rolaids, she has tried nothing for her pain.  She denies nausea, vomiting, or diaphoresis.  There is no radiating pain.  She reports it may be associated with SOB when it occurs.  She reports no exertional chest pain and no history of cramping, pressure-like discomfort in her chest.  She has never had a stress test or cardiac cath.  She reports seeing a cardiologist years ago but there are no current notes in Epic that I can find.  Plan: - Her chest pain sounds atypical but given her risk factors for ischemic heart disease (HTN, DM, HLD, smoking), will refer her to cardiology for risk stratification to consider stress testing vs catheterization. - In office EKG today shows rate 62, normal sinus rhythm, no acute ischemic changes, and was relatively unchanged from prior tracings.

## 2016-10-30 NOTE — Assessment & Plan Note (Signed)
BP Readings from Last 3 Encounters:  10/30/16 (!) 154/72  08/21/16 (!) 144/94  06/19/16 138/71   Assessment: Given her hx of DM, her BP is not currently at goal.  She is currently on HCTZ 25mg  daily and Atenolol 25mg  daily.  With her hx of DM, she is not on an ACE due to angioedema.  Microalbumin/creatinine ratio was greater than 30 earlier this year.  She reports no current complaints of headaches, visual changes, lightheadedness.  She took her medications today.  Plan: - continue HCTZ and atenolol at current doses - add verapamil 180mg  daily for both hypertensive and antiproteinuric benefit. - RTC 3 months for follow up

## 2016-10-30 NOTE — Assessment & Plan Note (Signed)
Lab Results  Component Value Date   HGBA1C 5.8 10/30/2016    Assessment: Potentially too strict glycemic control.  She is currently on Humalog 3mg  BID and metformin 1000mg  BID.  Previously, we have discussed coming off some of her medications given that her diabetes is under good control.  Today, she is agreeable to a trial off her insulin.  Plan: - Discontinue Humalog 3mg  BID - Continue metformin 1000mg  BID - Due to her anxiety regarding stopping insulin, advised that she may continue to check her blood sugars at home and to please let us know if they are consistently elevated - Reminded her to follow up with Dr. Quentin Ore regarding her eye exam - RTC 3 months

## 2016-10-30 NOTE — Progress Notes (Signed)
CC: f/u DM, HTN  HPI:  [name] is a 61 y.o. woman with a past medical history listed below here today for follow up of her HTN and DM.  She also has an acute complaint today of chest pain.  For details of today's visit and the status of her chronic medical issues please refer to the assessment and plan.   Past Medical History:  Diagnosis Date  . Anxiety   . Arthritis   . Asthma   . Chronic back pain   . Depression   . Diabetes mellitus   . Edema of lower extremity history cellulitis jan 2013  . Frequency of urination   . Hep C w/o coma, chronic ([place])   . History of chronic bronchitis   . History of drug abuse states quit herion in 2006  . Hypertension   . Methadone maintenance therapy patient ([place])   . Nocturia   . Tendonitis   . Urge urinary incontinence    Review of Systems:  Please see pertinent ROS reviewed in HPI and problem based charting.   Physical Exam:  Vitals:   10/30/16 1317  BP: (!) 154/72  Pulse: 72  Temp: 98.2 F (36.8 C)  TempSrc: Oral  SpO2: 98%  Weight: 162 lb 3.2 oz (73.6 kg)  Height: 5\' 3"  (1.6 m)   General: sitting in exam table, NAD HEENT: [place]/AT, EOMI, no scleral icterus Cardiac: RRR, no rubs, murmurs or gallops Pulm: clear to auscultation bilaterally, moving normal volumes of air Abd: soft, nontender, nondistended Ext: warm and well perfused, no pedal edema Neuro: alert and oriented X3, cranial nerves II-XII grossly intact   Assessment & Plan:   See Encounters Tab for problem based charting.  Patient discussed with Dr. Evette Doffing .  HTN (hypertension) BP Readings from Last 3 Encounters:  10/30/16 (!) 154/72  08/21/16 (!) 144/94  06/19/16 138/71   Assessment: Given her hx of DM, her BP is not currently at goal.  She is currently on HCTZ 25mg  daily and Atenolol 25mg  daily.  With her hx of DM, she is not on an ACE due to angioedema.  Microalbumin/creatinine ratio was greater than 30 earlier this year.  She reports no  current complaints of headaches, visual changes, lightheadedness.  She took her medications today.  Plan: - continue HCTZ and atenolol at current doses - add verapamil 180mg  daily for both hypertensive and antiproteinuric benefit. - RTC 3 months for follow up  Diabetes mellitus with complication, with long-term current use of insulin ([place]) Lab Results  Component Value Date   HGBA1C 5.8 10/30/2016    Assessment: Potentially too strict glycemic control.  She is currently on Humalog 3mg  BID and metformin 1000mg  BID.  Previously, we have discussed coming off some of her medications given that her diabetes is under good control.  Today, she is agreeable to a trial off her insulin.  Plan: - Discontinue Humalog 3mg  BID - Continue metformin 1000mg  BID - Due to her anxiety regarding stopping insulin, advised that she may continue to check her blood sugars at home and to please let us know if they are consistently elevated - Reminded her to follow up with Dr. Quentin Ore regarding her eye exam - RTC 3 months  Chest pain Assessment: This is a new complaint today.  Symptoms onset was 2 weeks ago and has been occurring every 3-4 days.  Initially, she thought it may be due to gas/indigestion although pain was not associated with food or eating.  Her  symptoms were unrelieved by TUMS and Rolaids.  Her symptoms occur while at rest (i.e. Watching TV) but have also occurred while doing the dishes or other light household chores.  She describes as sharp, stabbing, left sided, lasting a couple seconds before easing off.  Other than TUMS/Rolaids, she has tried nothing for her pain.  She denies nausea, vomiting, or diaphoresis.  There is no radiating pain.  She reports it may be associated with SOB when it occurs.  She reports no exertional chest pain and no history of cramping, pressure-like discomfort in her chest.  She has never had a stress test or cardiac cath.  She reports seeing a cardiologist years ago but  there are no current notes in Epic that I can find.  Plan: - Her chest pain sounds atypical but given her risk factors for ischemic heart disease (HTN, DM, HLD, smoking), will refer her to cardiology for risk stratification to consider stress testing vs catheterization. - In office EKG today shows rate 62, normal sinus rhythm, no acute ischemic changes, and was relatively unchanged from prior tracings.

## 2016-10-30 NOTE — Patient Instructions (Addendum)
Thank you for coming to see me today. It was a pleasure. Today we talked about:   Blood pressure: - continue taking your atenolol and HCTZ - START taking Verapamil 180mg  daily. I have sent a new prescription to your pharmacy  Diabetes: - continue taking your metformin - STOP taking your insulin - continue checking your blood sugars to make sure they do not get too high without insulin.  Call me if they are consistently above 200.  Chest Pain: - I will refer you to cardiology for further evaluation.  Please follow-up with me in 3 months.  If you have any questions or concerns, please do not hesitate to call the office at [phone].  Take Care,   Jule Ser, DO Verapamil sustained-release capsules What is this medicine? VERAPAMIL (ver AP a mil) is a calcium-channel blocker. It affects the amount of calcium found in your heart and muscle cells. This relaxes your blood vessels, which can reduce the amount of work the heart has to do. This medicine is used to lower high blood pressure. This medicine may be used for other purposes; ask your health care provider or pharmacist if you have questions. COMMON BRAND NAME(S): Verelan, Verelan PM What should I tell my health care provider before I take this medicine? They need to know if you have any of these conditions: -heart or blood vessel disease -heart rhythm disturbances such as sick sinus syndrome, ventricular arrhythmias, Wolff-Parkinson-White syndrome, or Lown-Ganong-Levine syndrome -liver or kidney disease -low blood pressure -an unusual or allergic reaction to verapamil, other medicines, foods, dyes, or preservatives -pregnant or trying to get pregnant -breast-feeding How should I use this medicine? Take this medicine by mouth with a glass of water. Follow the directions on the prescription label. The capsules may be opened and the medicine poured into a small amount of applesauce. Stir well and swallow without chewing. Take  this medicine with food to reduce stomach upset. Take your doses at regular intervals. Do not take your medicine more often then directed. Do not stop taking except on the advice of your doctor or health care professional. Talk to your pediatrician regarding the use of this medicine in children. Special care may be needed. Overdosage: If you think you have taken too much of this medicine contact a poison control center or emergency room at once. NOTE: This medicine is only for you. Do not share this medicine with others. What if I miss a dose? If you miss a dose, take it as soon as you can. If it is almost time for your next dose, take only that dose. Do not take double or extra doses. What may interact with this medicine? Do not take this medicine with any of the following: -cisapride -disopyramide -dofetilide -grapefruit juice -hawthorn -pimozide -red yeast rice This medicine may also interact with the following medications: -barbiturates such as phenobarbital -cimetidine -cyclosporine -lithium -local anesthetics or general anesthetics -medicines for heart rhythm problems like amiodarone, digoxin, flecainide, procainamide, quinidine -medicines for high blood pressure or heart problems -medicines for seizures like carbamazepine and phenytoin -rifampin, rifabutin or rifapentine -theophylline or aminophylline This list may not describe all possible interactions. Give your health care provider a list of all the medicines, herbs, non-prescription drugs, or dietary supplements you use. Also tell them if you smoke, drink alcohol, or use illegal drugs. Some items may interact with your medicine. What should I watch for while using this medicine? Check your blood pressure and pulse rate regularly. Ask your doctor  or health care professional what your blood pressure and pulse rate should be and when you should contact him or her. Do not suddenly stop taking this medicine. Ask your doctor or  health care professional how to gradually reduce the dose. You may get drowsy or dizzy. Do not drive, use machinery, or do anything that needs mental alertness until you know how this medicine affects you. Do not stand or sit up quickly, especially if you are an older patient. This reduces the risk of dizzy or fainting spells. Alcohol may interfere with the effect of this medicine. Avoid alcoholic drinks. What side effects may I notice from receiving this medicine? Side effects that you should report to your doctor or health care professional as soon as possible: -difficulty breathing -dizziness or light headedness -fainting -fast heartbeat, palpitations, irregular heartbeat, or chest pain -skin rash -slow heartbeat -swelling of the legs or ankles Side effects that usually do not require medical attention (report to your doctor or health care professional if they continue or are bothersome): -constipation -facial flushing -headache -nausea, vomiting -sexual dysfunction -weakness or tiredness This list may not describe all possible side effects. Call your doctor for medical advice about side effects. You may report side effects to FDA at 1-800-FDA-1088. Where should I keep my medicine? Keep out of the reach of children. Store at room temperature between 15 and 25 degrees C (59 and 77 degrees F). Protect from light and moisture. Keep container tightly closed. NOTE: This sheet is a summary. It may not cover all possible information. If you have questions about this medicine, talk to your doctor, pharmacist, or health care provider.  2018 Elsevier/Gold Standard (2007-12-13 17:47:58)

## 2016-10-31 NOTE — Progress Notes (Signed)
Internal Medicine Clinic Attending  Case discussed with Dr. Wallace at the time of the visit.  We reviewed the resident's history and exam and pertinent patient test results.  I agree with the assessment, diagnosis, and plan of care documented in the resident's note.  

## 2016-11-04 ENCOUNTER — Ambulatory Visit (INDEPENDENT_AMBULATORY_CARE_PROVIDER_SITE_OTHER): Payer: Medicare Other | Admitting: Podiatry

## 2016-11-04 ENCOUNTER — Encounter: Payer: Self-pay | Admitting: Podiatry

## 2016-11-04 DIAGNOSIS — M79674 Pain in right toe(s): Secondary | ICD-10-CM | POA: Diagnosis not present

## 2016-11-04 DIAGNOSIS — E1142 Type 2 diabetes mellitus with diabetic polyneuropathy: Secondary | ICD-10-CM

## 2016-11-04 DIAGNOSIS — B351 Tinea unguium: Secondary | ICD-10-CM

## 2016-11-04 DIAGNOSIS — M79675 Pain in left toe(s): Secondary | ICD-10-CM

## 2016-11-04 NOTE — Patient Instructions (Signed)

## 2016-11-04 NOTE — Progress Notes (Signed)
Patient ID: Rose [name], female   DOB: January 14, 1956, 61 y.o.   MRN: [id number]    Subjective:   This patient presents today requesting a diabetic foot examination because of a burning, throbbing, tenderness and swelling that she has in her feet on and off weightbearing over a multiple month period of time.Patient states that she has neuropathy causing the symptoms. She's had no specific treatment for these symptoms. Also, patient is complaining that her toenails are long and thick her uncomfortable when walking wearing shoes and requests nail debridement.  Patient also is complaining of diffuse bilateral plantar foot pain aggravated with standing walking required the use a cane. The symptoms developed after the visit of 07/09/2016 Patient has reported this to her primary care physician.  Patient is diabetic and denies history of ulceration, claudication an amputation Patient is a current smoker  Objective: Orientated 3  Vascular: No peripheral edema bilaterally DP and PT pulses 2/4 bilaterally Capillary reflex immediate bilaterally  Neurological: Sensation to 10 g monofilament wire intact 4/5 right 2/5 left Vibratory sensation reactive bilaterally Ankle reflex equal and reactive bilaterally  Dermatological: No open skin lesions bilaterally The toenails are elongated, brittle, deformed, discolored and tender direct palpation 6-10  Musculoskeletal: Limited range of motion subtalar, midtarsal joints bilaterally Manual motor testing: Dorsi flexion, plantar flexion, inversion, eversion 5/5 bilaterally Patient uses a cane when walking  Assessment: Insulin control diabetic with history of neuropathy History of drug abuse Symptomatic mycotic toenails 6-10 Pending future visit to evaluate diffuse plantar foot pain History of hepatitis C  Plan: Debridement toenails 6-10 mechanically and electrically without any bleeding    Reappoint 3 months Symptomatic  onychomycoses 6-10  Schedule an additional appointment prior to next scheduled visit to evaluate patient's complaint of plantar foot pain

## 2016-11-18 ENCOUNTER — Ambulatory Visit (INDEPENDENT_AMBULATORY_CARE_PROVIDER_SITE_OTHER): Payer: Medicare Other

## 2016-11-18 ENCOUNTER — Encounter: Payer: Self-pay | Admitting: Podiatry

## 2016-11-18 ENCOUNTER — Ambulatory Visit (INDEPENDENT_AMBULATORY_CARE_PROVIDER_SITE_OTHER): Payer: Medicare Other | Admitting: Podiatry

## 2016-11-18 VITALS — BP 132/69 | HR 64

## 2016-11-18 DIAGNOSIS — M79672 Pain in left foot: Secondary | ICD-10-CM | POA: Diagnosis not present

## 2016-11-18 DIAGNOSIS — M7751 Other enthesopathy of right foot: Secondary | ICD-10-CM

## 2016-11-18 DIAGNOSIS — M7752 Other enthesopathy of left foot: Secondary | ICD-10-CM

## 2016-11-18 DIAGNOSIS — M722 Plantar fascial fibromatosis: Secondary | ICD-10-CM | POA: Diagnosis not present

## 2016-11-18 DIAGNOSIS — M79671 Pain in right foot: Secondary | ICD-10-CM

## 2016-11-18 NOTE — Patient Instructions (Signed)

## 2016-11-18 NOTE — Progress Notes (Signed)
   Subjective:    Patient ID: Rose [name], female    DOB: 1955-10-08, 61 y.o.   MRN: [id number]  HPI  This patient presents for follow-up care for evaluation of bilateral plantar foot pain localized to the MPJ heel areas for the past 4-5 months. She describes pain upon weight bearing and standing and relieved with rest and elevation. More recently patient has place soft pads in shoes with slight reduction of discomfort.  Patient is a known diabetic without history of ulceration, claudication or amputation Patient current smoker    Review of Systems  Musculoskeletal: Positive for gait problem.  All other systems reviewed and are negative.      Objective:   Physical Exam    Objective: Orientated 3  Vascular: No peripheral edema bilaterally DP and PT pulses 2/4 bilaterally Capillary reflex immediate bilaterally  Neurological: Sensation to 10 g monofilament wire intact 4/5 right 2/5 left Vibratory sensation reactive bilaterally Ankle reflex equal and reactive bilaterally  Dermatological: No open skin lesions bilaterally The toenails are elongated, brittle, deformed, discolored and tender direct palpation 6-10  Musculoskeletal: Limited range of motion subtalar, midtarsal joints bilaterally Manual motor testing: Dorsi flexion, plantar flexion, inversion, eversion 5/5 bilaterally Patient uses a cane when walking Palpable tenderness medial plantar fascial insertional area bilaterally without any palpable lesions Palpable tenderness plantar 1-5 MPJ bilaterally Tenderness on range of motion 1-5 MPJ bilaterally without crepitus  X-ray examination weightbearing right foot dated 11/18/2016  Intact bony structures without fracture and/or dislocation Decreased bone density noted throughout all views Decrease joint space Lisfranc articulation Hammertoe second  Radiographic impression: No acute bony abnormality noted weightbearing x-ray right foot Midfoot osteoarthritis  right  X-ray examination weightbearing left foot dated 11/18/2016  Intact bony structures without fracture and/or dislocation Decreased bone density noted throughout all views Decrease joint space Lisfranc articulation Hammertoe second  Radiographic impression: No acute bony abnormality noted weightbearing x-ray right foot Midfoot osteoarthritis right       Assessment & Plan:   Assessment: Plantar fasciitis bilaterally Capsulitis 1-5/metatarsalgia bilaterally  Plan: Reviewed the results with exam and x-ray with patient I attached additional felt metatarsal raises the distal soft arch pads patient is currently wearing Discussed correct shoeing and stretching  Reappoint 6 weeks

## 2016-11-28 ENCOUNTER — Encounter: Payer: Self-pay | Admitting: *Deleted

## 2016-12-19 ENCOUNTER — Encounter (INDEPENDENT_AMBULATORY_CARE_PROVIDER_SITE_OTHER): Payer: Self-pay

## 2016-12-19 ENCOUNTER — Encounter: Payer: Self-pay | Admitting: Internal Medicine

## 2016-12-19 ENCOUNTER — Ambulatory Visit (INDEPENDENT_AMBULATORY_CARE_PROVIDER_SITE_OTHER): Payer: Medicare Other | Admitting: Internal Medicine

## 2016-12-19 VITALS — BP 110/60 | HR 66 | Ht 63.0 in | Wt 160.0 lb

## 2016-12-19 DIAGNOSIS — I509 Heart failure, unspecified: Secondary | ICD-10-CM | POA: Diagnosis not present

## 2016-12-19 DIAGNOSIS — R0602 Shortness of breath: Secondary | ICD-10-CM | POA: Diagnosis not present

## 2016-12-19 DIAGNOSIS — I10 Essential (primary) hypertension: Secondary | ICD-10-CM

## 2016-12-19 DIAGNOSIS — R079 Chest pain, unspecified: Secondary | ICD-10-CM

## 2016-12-19 DIAGNOSIS — R600 Localized edema: Secondary | ICD-10-CM | POA: Diagnosis not present

## 2016-12-19 MED ORDER — FUROSEMIDE 40 MG PO TABS
40.0000 mg | ORAL_TABLET | Freq: Every day | ORAL | 1 refills | Status: DC
Start: 2016-12-19 — End: 2017-01-02

## 2016-12-19 NOTE — Progress Notes (Signed)
New Outpatient Visit Date: 12/19/2016  Referring Provider: Jule Ser, DO [address] [place], [place] [address]  Chief Complaint: Chest pain  HPI:  Rose [name] is a 61 y.o. female who is being seen today for the evaluation of chest pain at the request of Dr. Juleen China. She has a history of hypertension and diabetes mellitus. About 2 months ago, Rose [name] began noticing worsening fatigue to the point where she had to start taking naps. During this time, she also developed intermittent chest pain, which she describes as a substernal stabbing pain without radiation that is associated with numbness in her fingertips. The pain is non-exertional and can come on at any time without clear precipitants. Its maximal intensity is 4/10 and occurs about once every 3 days. She initially thought the pain was indigestion, though it does not seem to be related to eating. She has never had this pain before.  In addition, Rose [name] also reports increasing leg swelling over the last few months. She also reports 4-pillow orthopnea and PND, both of which are new. Rose [name] states that she was told that she had an irregular heart beat many years ago, though she otherwise denies a history of cardiac disease. She reports undergoing a stress test several years ago, which she believes was normal.  --------------------------------------------------------------------------------------------------  Cardiovascular History & Procedures: Cardiovascular Problems:  Atypical chest pain  Shortness of breath and edema  Risk Factors:  Hypertension, diabetes mellitus, family history, tobacco abuse, and remote IV drug use.  Cath/PCI:  None  CV Surgery:  None  EP Procedures and Devices:  None  Non-Invasive Evaluation(s):  Stress test reportedly normal (date unknown)  Recent CV Pertinent Labs: Lab Results  Component Value Date   CHOL 161 12/12/2015   HDL 35 (L) 12/12/2015   LDLCALC 94 12/12/2015   TRIG  159 (H) 12/12/2015   CHOLHDL 4.6 (H) 12/12/2015   INR 1.13 08/20/2012   K 4.7 12/19/2016   MG 1.8 11/04/2014   BUN 20 12/19/2016   CREATININE 1.34 (H) 12/19/2016    --------------------------------------------------------------------------------------------------  Past Medical History:  Diagnosis Date  . Anxiety   . Arthritis   . Asthma   . Chronic back pain   . Depression   . Diabetes mellitus   . Edema of lower extremity history cellulitis jan 2013  . Frequency of urination   . Hep C w/o coma, chronic ([place])   . History of chronic bronchitis   . History of drug abuse states quit herion in 2006  . Hypertension   . Methadone maintenance therapy patient ([place])   . Nocturia   . Tendonitis   . Urge urinary incontinence     Past Surgical History:  Procedure Laterality Date  . APPENDECTOMY  1972  . INTERSTIM IMPLANT PLACEMENT  10/23/2011   Procedure: Barrie Lyme IMPLANT FIRST STAGE;  Surgeon: Reece Packer, MD;  Location[facility];  Service: Urology;  Laterality: N/A;  . INTERSTIM IMPLANT PLACEMENT  10/23/2011   Procedure: Barrie Lyme IMPLANT SECOND STAGE;  Surgeon: Reece Packer, MD;  Location: [facility];  Service: Urology;  Laterality: N/A;    Current Meds  Medication Sig  . albuterol (PROVENTIL HFA;VENTOLIN HFA) 108 (90 Base) MCG/ACT inhaler Inhale 2 puffs into the lungs every 6 (six) hours as needed. For wheezing  . atenolol (TENORMIN) 50 MG tablet Take 0.5 tablets (25 mg total) by mouth daily.  [name] atorvastatin (LIPITOR) 40 MG tablet Take 1 tablet (40 mg total) by  mouth daily.  . baclofen (LIORESAL) 10 MG tablet TAKE 1 TABLET BY MOUTH TWICE DAILY AS NEEDED FOR MUSCLE SPASMS  . cetirizine (ZYRTEC) 10 MG tablet Take 1 tablet (10 mg total) by mouth daily.  . DULoxetine (CYMBALTA) 30 MG capsule Take 1 capsule (30 mg total) by mouth daily.  [name] glucose blood (ACCU-CHEK AVIVA PLUS) test strip Check blood sugar 3 times a day with accu chek  aviva plus strips  . hydrochlorothiazide (HYDRODIURIL) 25 MG tablet Take 1 tablet (25 mg total) by mouth daily.  Elmore Guise Devices (ACCU-CHEK SOFTCLIX) lancets Check blood sugar 3 times a day. Patient is insulin requiring, ICD 10 code E11.9.  . Lancets 30G MISC Check blood sugar 3 times a day  . metFORMIN (GLUCOPHAGE) 1000 MG tablet Take 1 tablet (1,000 mg total) by mouth 2 (two) times daily.  . methadone (DOLOPHINE) 10 MG/5ML solution Take 100 mg by mouth daily.  . nicotine (NICODERM CQ - DOSED IN MG/24 HOURS) 14 mg/24hr patch Medicaid [id number] L Wks 1-6: 14 mg x 1 patch daily. Wear for 24 hours. If you have sleep disturbances, remove at bedtime.  . nicotine (NICODERM CQ - DOSED IN MG/24 HR) 7 mg/24hr patch Medicaid [id number] L Wks 7-8: 7 mg x 1 patch dailyWear for 24 hours. If you have sleep disturbances, remove at bedtime..  . nicotine polacrilex (NICORETTE) 2 MG gum Medicaid [id number] L. Dispense quantity sufficient, see below for instructions  . verapamil (CALAN-SR) 180 MG CR tablet Take 1 tablet (180 mg total) by mouth daily.    Allergies: Lisinopril; Gabapentin; Hydrocodone-acetaminophen; and Vicodin [hydrocodone-acetaminophen]  Social History   Social History  . Marital status: Single    Spouse name: N/A  . Number of children: N/A  . Years of education: N/A   Occupational History  . Not on file.   Social History Main Topics  . Smoking status: Current Every Day Smoker    Packs/day: 0.25    Years: 20.00    Types: Cigarettes  . Smokeless tobacco: Never Used     Comment: 1 cigs per day  . Alcohol use No  . Drug use: No     Comment: methadone program  PER PT QUIT HEROIN 2006  ALSO HX MARIJUANA USE  . Sexual activity: No   Other Topics Concern  . Not on file   Social History Narrative  . No narrative on file    Family History  Problem Relation Age of Onset  . Hypertension Mother   . Diabetes type II Mother   . Heart attack Mother 29  . CAD Father   . Heart attack  Father 50  . Hypertension Brother   . Diabetes type II Brother   . Heart attack Brother 82  . Diabetes type II Sister     Review of Systems: Review of Systems  Constitutional: Positive for diaphoresis and malaise/fatigue. Negative for weight loss (appetite changes).  HENT: Positive for hearing loss.   Eyes: Positive for blurred vision.  Respiratory: Positive for shortness of breath and wheezing.   Cardiovascular: Positive for chest pain, palpitations, orthopnea, leg swelling and PND. Negative for claudication.  Gastrointestinal: Positive for abdominal pain and constipation.  Genitourinary: Negative.   Musculoskeletal: Positive for myalgias.  Neurological: Negative.   Endo/Heme/Allergies: Negative.   Psychiatric/Behavioral: The patient is nervous/anxious.    --------------------------------------------------------------------------------------------------  Physical Exam: BP 110/60   Pulse 66   Ht 5\' 3"  (1.6 m)   Wt 160 lb (72.6 kg)   LMP 07/31/2011   SpO2 95%  BMI 28.34 kg/m   General:  Overweight woman, seated comfortably in the exam room. HEENT: No conjunctival pallor or scleral icterus. Moist mucous membranes. OP clear. Neck: Supple without lymphadenopathy, thyromegaly, JVD, or HJR. No carotid bruit. Lungs: Normal work of breathing. Clear to auscultation bilaterally without wheezes or crackles. Heart: Regular rate and rhythm without murmurs, rubs, or gallops. Non-displaced PMI. Abd: Bowel sounds present. Soft, NT/ND without hepatosplenomegaly Ext: 1+ pretibial edema to the proximal calves. Radial, PT, and DP pulses are 2+ bilaterally Skin: Warm and dry without rash. Neuro: CNIII-XII intact. Strength and fine-touch sensation intact in upper and lower extremities bilaterally. Psych: Normal mood and affect.  EKG:  NSR with non-specific T-wave changes.  Lab Results  Component Value Date   WBC 11.8 (H) 06/05/2016   HGB 12.0 06/05/2016   HCT 37.3 06/05/2016   MCV 83  06/05/2016   PLT 171 06/05/2016    Lab Results  Component Value Date   NA 141 12/19/2016   K 4.7 12/19/2016   CL 99 12/19/2016   CO2 27 12/19/2016   BUN 20 12/19/2016   CREATININE 1.34 (H) 12/19/2016   GLUCOSE 118 (H) 12/19/2016   ALT 9 (L) 04/27/2016    Lab Results  Component Value Date   CHOL 161 12/12/2015   HDL 35 (L) 12/12/2015   LDLCALC 94 12/12/2015   TRIG 159 (H) 12/12/2015   CHOLHDL 4.6 (H) 12/12/2015    --------------------------------------------------------------------------------------------------  ASSESSMENT AND PLAN: Chest pain Overall, pain is atypical in nature, given that it is sharp and non-exertional. However, Rose [name] has multiple cardiac risk factors. Given that she also expresses progressive heart failure symptoms over the last few months, I would like to begin with an echo. If her LVEF is preserved, we will proceed with myocardial perfusion stress testing. If her EF is low or there is a wall-motion abnormalities, we will proceed directly to cardiac catheterization. I have instructed Rose [name] to begin taking ASA 81 mg daily pending our evaluation.  Heart failure Symptoms of progressive dyspnea, edema, orthopnea, and PND are concerning for heart failure. We will begin with an echo, as noted above, and then determine further ischemia testing. We will start furosemide 40 mg daily and obtain a BMP today and again in 1-2 weeks.  Hypertension Blood pressure is normal today. We will add furosemide, as above. No other medication changes at this time.  Follow-up: Return to clinic in 1 month.  Nelva Bush, MD 12/20/2016 3:05 PM

## 2016-12-19 NOTE — Patient Instructions (Signed)
Medication Instructions:  Start aspirin 81 mg daily. You do not need a prescription for this.  Start lasix (furosemide) 40 mg daily  Labwork: Your physician recommends that you have lab today--BMET.  Your physician recommends that you return for lab work in: 1- 2 weeks-you can get this the day you have your echocardiogram done.   Testing/Procedures: Your physician has requested that you have an echocardiogram. Echocardiography is a painless test that uses sound waves to create images of your heart. It provides your doctor with information about the size and shape of your heart and how well your heart's chambers and valves are working. This procedure takes approximately one hour. There are no restrictions for this procedure.    Follow-Up: Your physician recommends that you schedule a follow-up appointment in: 1 month with Dr End or APP.        If you need a refill on your cardiac medications before your next appointment, please call your pharmacy.

## 2016-12-20 DIAGNOSIS — I5030 Unspecified diastolic (congestive) heart failure: Secondary | ICD-10-CM | POA: Insufficient documentation

## 2016-12-20 LAB — BASIC METABOLIC PANEL
BUN/Creatinine Ratio: 15 (ref 9–23)
BUN: 20 mg/dL (ref 6–24)
CO2: 27 mmol/L (ref 20–29)
Calcium: 9.1 mg/dL (ref 8.7–10.2)
Chloride: 99 mmol/L (ref 96–106)
Creatinine, Ser: 1.34 mg/dL — ABNORMAL HIGH (ref 0.57–1.00)
GFR calc Af Amer: 50 mL/min/{1.73_m2} — ABNORMAL LOW (ref >59)
GFR calc non Af Amer: 43 mL/min/{1.73_m2} — ABNORMAL LOW (ref >59)
Glucose: 118 mg/dL — ABNORMAL HIGH (ref 65–99)
Potassium: 4.7 mmol/L (ref 3.5–5.2)
Sodium: 141 mmol/L (ref 134–144)

## 2016-12-29 ENCOUNTER — Ambulatory Visit (INDEPENDENT_AMBULATORY_CARE_PROVIDER_SITE_OTHER): Payer: Medicare Other | Admitting: Podiatry

## 2016-12-29 ENCOUNTER — Encounter: Payer: Self-pay | Admitting: Podiatry

## 2016-12-29 DIAGNOSIS — M7751 Other enthesopathy of right foot: Secondary | ICD-10-CM

## 2016-12-29 DIAGNOSIS — M79674 Pain in right toe(s): Secondary | ICD-10-CM

## 2016-12-29 DIAGNOSIS — E088 Diabetes mellitus due to underlying condition with unspecified complications: Secondary | ICD-10-CM | POA: Diagnosis not present

## 2016-12-29 DIAGNOSIS — Z794 Long term (current) use of insulin: Secondary | ICD-10-CM | POA: Diagnosis not present

## 2016-12-29 DIAGNOSIS — M722 Plantar fascial fibromatosis: Secondary | ICD-10-CM | POA: Diagnosis not present

## 2016-12-29 DIAGNOSIS — B351 Tinea unguium: Secondary | ICD-10-CM

## 2016-12-29 DIAGNOSIS — M79671 Pain in right foot: Secondary | ICD-10-CM | POA: Diagnosis not present

## 2016-12-29 DIAGNOSIS — M79672 Pain in left foot: Secondary | ICD-10-CM

## 2016-12-29 DIAGNOSIS — M79675 Pain in left toe(s): Secondary | ICD-10-CM

## 2016-12-29 NOTE — Progress Notes (Signed)
Patient ID: Rose [name], female   DOB: October 07, 1955, 61 y.o.   MRN: [id number]   Subjective: This patient presents for follow-up visit of 11/18/2016 she states she had a great deal relief when wearing the pads attach her insoles. She also states that her heel pain has reduced significantly. Patient is also complaining of uncomfortable toenails walking wearing shoes and request toenail debridement as well today Patient is a known diabetic without history of ulceration, claudication or amputation Patient current smoker  Objective: Orientated 3  Vascular: No peripheral edema bilaterally DP and PT pulses 2/4 bilaterally Capillary reflex immediate bilaterally  Neurological: Sensation to 10 g monofilament wire intact 4/5 right 2/5 left Vibratory sensation reactive bilaterally Ankle reflex equal and reactive bilaterally  Dermatological: No open skin lesions bilaterally The toenails are elongated, brittle, deformed, discolored and tender direct palpation 6-10  Musculoskeletal: Limited range of motion subtalar, midtarsal joints bilaterally Manual motor testing: Dorsi flexion, plantar flexion, inversion, eversion 5/5 bilaterally Patient uses a cane when walking Palpable tenderness medial plantar fascial insertional area bilaterally without any palpable lesions Palpable tenderness plantar 1-5 MPJ bilaterally Tenderness on range of motion 1-5 MPJ bilaterally without crepitus  X-ray examination weightbearing right foot dated 11/18/2016  Intact bony structures without fracture and/or dislocation Decreased bone density noted throughout all views Decrease joint space Lisfranc articulation Hammertoe second  Radiographic impression: No acute bony abnormality noted weightbearing x-ray right foot Midfoot osteoarthritis right  X-ray examination weightbearing left foot dated 11/18/2016  Intact bony structures without fracture and/or dislocation Decreased bone density noted  throughout all views Decrease joint space Lisfranc articulation Hammertoe second  Radiographic impression: No acute bony abnormality noted weightbearing x-ray right foot Midfoot osteoarthritis right       Assessment & Plan:   Assessment: Plantar fasciitis bilaterally,Improving Capsulitis 1-5/metatarsalgia bilaterally, improving Symptomatic mycotic toenails 6-10  Plan: Debridement of toenails 6-10 mechanically electrical without any bleeding Return for accommodative foot orthotic with metatarsal raise 2-4 to treat metatarsalgia and Semirigid flexible full length orthotic in order treatment bilateral fasciitis  Return for impression for prescription foot orthotics

## 2016-12-29 NOTE — Patient Instructions (Signed)

## 2017-01-01 ENCOUNTER — Other Ambulatory Visit: Payer: Self-pay

## 2017-01-01 ENCOUNTER — Ambulatory Visit (HOSPITAL_COMMUNITY): Payer: Medicare Other | Attending: Cardiology

## 2017-01-01 ENCOUNTER — Other Ambulatory Visit: Payer: Medicare Other

## 2017-01-01 DIAGNOSIS — E119 Type 2 diabetes mellitus without complications: Secondary | ICD-10-CM | POA: Diagnosis not present

## 2017-01-01 DIAGNOSIS — Z8249 Family history of ischemic heart disease and other diseases of the circulatory system: Secondary | ICD-10-CM | POA: Insufficient documentation

## 2017-01-01 DIAGNOSIS — I110 Hypertensive heart disease with heart failure: Secondary | ICD-10-CM | POA: Insufficient documentation

## 2017-01-01 DIAGNOSIS — R079 Chest pain, unspecified: Secondary | ICD-10-CM | POA: Insufficient documentation

## 2017-01-01 DIAGNOSIS — I10 Essential (primary) hypertension: Secondary | ICD-10-CM

## 2017-01-01 DIAGNOSIS — Z720 Tobacco use: Secondary | ICD-10-CM | POA: Insufficient documentation

## 2017-01-01 DIAGNOSIS — I509 Heart failure, unspecified: Secondary | ICD-10-CM | POA: Diagnosis not present

## 2017-01-01 LAB — BASIC METABOLIC PANEL
BUN/Creatinine Ratio: 22 (ref 9–23)
BUN: 40 mg/dL — ABNORMAL HIGH (ref 6–24)
CO2: 23 mmol/L (ref 20–29)
Calcium: 9.6 mg/dL (ref 8.7–10.2)
Chloride: 88 mmol/L — ABNORMAL LOW (ref 96–106)
Creatinine, Ser: 1.85 mg/dL — ABNORMAL HIGH (ref 0.57–1.00)
GFR calc Af Amer: 34 mL/min/{1.73_m2} — ABNORMAL LOW (ref >59)
GFR calc non Af Amer: 29 mL/min/{1.73_m2} — ABNORMAL LOW (ref >59)
Glucose: 106 mg/dL — ABNORMAL HIGH (ref 65–99)
Potassium: 3.5 mmol/L (ref 3.5–5.2)
Sodium: 134 mmol/L (ref 134–144)

## 2017-01-02 ENCOUNTER — Telehealth: Payer: Self-pay | Admitting: *Deleted

## 2017-01-02 DIAGNOSIS — R079 Chest pain, unspecified: Secondary | ICD-10-CM

## 2017-01-02 DIAGNOSIS — I10 Essential (primary) hypertension: Secondary | ICD-10-CM

## 2017-01-02 NOTE — Telephone Encounter (Signed)
Notes recorded by Nelva Bush, MD on 01/01/2017 at 9:10 PM EDT Please let Rose [name] know that her echo shows normal pumping of her heart. I do not believe that her symptoms are related to overt heart failure. Additionally, her kidney function has worsened slightly with addition a furosemide. I recommend that she stop taking furosemide and repeat a BMP in 1-2 weeks. I also recommend that we do a Myoview to evaluate her chest pain (exercise if possible but ok if Lexiscan if unable to walk on the treadmill).

## 2017-01-05 ENCOUNTER — Telehealth (HOSPITAL_COMMUNITY): Payer: Self-pay | Admitting: Internal Medicine

## 2017-01-06 NOTE — Telephone Encounter (Signed)
User: Rose [name] A Date/time: 01/05/17 9:34 AM  Comment: Called pt and lmsg for her to CB to get scheduled for a myoview.   Context:  Outcome: Left Message  Phone number: [phone] Phone Type: Home Phone  Comm. type: Telephone Call type: Outgoing  Contact: Lackey, Libni Relation to patient: Self

## 2017-01-08 ENCOUNTER — Other Ambulatory Visit: Payer: Self-pay | Admitting: Internal Medicine

## 2017-01-08 ENCOUNTER — Other Ambulatory Visit: Payer: Self-pay | Admitting: *Deleted

## 2017-01-08 DIAGNOSIS — M545 Low back pain, unspecified: Secondary | ICD-10-CM

## 2017-01-08 DIAGNOSIS — G8929 Other chronic pain: Secondary | ICD-10-CM

## 2017-01-08 MED ORDER — DULOXETINE HCL 30 MG PO CPEP: 30.0000 mg | ORAL_CAPSULE | Freq: Every day | ORAL | 1 refills | Status: DC

## 2017-01-12 ENCOUNTER — Telehealth (HOSPITAL_COMMUNITY): Payer: Self-pay | Admitting: *Deleted

## 2017-01-12 NOTE — Telephone Encounter (Signed)
Patient given detailed instructions per Myocardial Perfusion Study Information Sheet for the test on 01/15/17 at 1000. Patient notified to arrive 15 minutes early and that it is imperative to arrive on time for appointment to keep from having the test rescheduled.  If you need to cancel or reschedule your appointment, please call the office within 24 hours of your appointment. . Patient verbalized understanding.Findlay Dagher, Ranae Palms

## 2017-01-13 ENCOUNTER — Other Ambulatory Visit: Payer: Medicare Other | Admitting: *Deleted

## 2017-01-13 DIAGNOSIS — R079 Chest pain, unspecified: Secondary | ICD-10-CM | POA: Diagnosis not present

## 2017-01-13 DIAGNOSIS — I10 Essential (primary) hypertension: Secondary | ICD-10-CM

## 2017-01-13 LAB — BASIC METABOLIC PANEL
BUN/Creatinine Ratio: 14 (ref 9–23)
BUN: 19 mg/dL (ref 6–24)
CO2: 21 mmol/L (ref 20–29)
Calcium: 9.3 mg/dL (ref 8.7–10.2)
Chloride: 94 mmol/L — ABNORMAL LOW (ref 96–106)
Creatinine, Ser: 1.32 mg/dL — ABNORMAL HIGH (ref 0.57–1.00)
GFR calc Af Amer: 51 mL/min/{1.73_m2} — ABNORMAL LOW (ref >59)
GFR calc non Af Amer: 44 mL/min/{1.73_m2} — ABNORMAL LOW (ref >59)
Glucose: 151 mg/dL — ABNORMAL HIGH (ref 65–99)
Potassium: 3.7 mmol/L (ref 3.5–5.2)
Sodium: 133 mmol/L — ABNORMAL LOW (ref 134–144)

## 2017-01-15 ENCOUNTER — Ambulatory Visit (HOSPITAL_COMMUNITY): Payer: Medicare Other | Attending: Cardiovascular Disease

## 2017-01-15 DIAGNOSIS — R079 Chest pain, unspecified: Secondary | ICD-10-CM | POA: Diagnosis not present

## 2017-01-15 DIAGNOSIS — R112 Nausea with vomiting, unspecified: Secondary | ICD-10-CM

## 2017-01-15 DIAGNOSIS — I10 Essential (primary) hypertension: Secondary | ICD-10-CM | POA: Diagnosis not present

## 2017-01-15 LAB — MYOCARDIAL PERFUSION IMAGING
LV dias vol: 78 mL (ref 46–106)
LV sys vol: 35 mL
Peak HR: 102 {beats}/min
RATE: 0.27
Rest HR: 63 {beats}/min
SDS: 4
SRS: 4
SSS: 8
TID: 1.13

## 2017-01-15 MED ORDER — TECHNETIUM TC 99M TETROFOSMIN IV KIT
32.2000 | PACK | Freq: Once | INTRAVENOUS | Status: AC | PRN
  Administered 2017-01-15: 32.2 via INTRAVENOUS
  Filled 2017-01-15: qty 33

## 2017-01-15 MED ORDER — REGADENOSON 0.4 MG/5ML IV SOLN
0.4000 mg | Freq: Once | INTRAVENOUS | Status: AC
  Administered 2017-01-15: 0.4 mg via INTRAVENOUS

## 2017-01-15 MED ORDER — TECHNETIUM TC 99M TETROFOSMIN IV KIT
10.4000 | PACK | Freq: Once | INTRAVENOUS | Status: AC | PRN
  Administered 2017-01-15: 10.4 via INTRAVENOUS
  Filled 2017-01-15: qty 11

## 2017-01-15 MED ORDER — AMINOPHYLLINE 25 MG/ML IV SOLN
150.0000 mg | Freq: Once | INTRAVENOUS | Status: AC
  Administered 2017-01-15: 150 mg via INTRAVENOUS

## 2017-01-21 ENCOUNTER — Other Ambulatory Visit: Payer: Self-pay | Admitting: Internal Medicine

## 2017-01-21 DIAGNOSIS — I10 Essential (primary) hypertension: Secondary | ICD-10-CM

## 2017-01-21 MED ORDER — HYDROCHLOROTHIAZIDE 25 MG PO TABS: 25.0000 mg | ORAL_TABLET | Freq: Every day | ORAL | 1 refills | Status: DC

## 2017-01-21 MED ORDER — ATENOLOL 50 MG PO TABS: 25.0000 mg | ORAL_TABLET | Freq: Every day | ORAL | 1 refills | Status: DC

## 2017-01-23 ENCOUNTER — Ambulatory Visit (INDEPENDENT_AMBULATORY_CARE_PROVIDER_SITE_OTHER): Payer: Medicare Other | Admitting: Internal Medicine

## 2017-01-23 ENCOUNTER — Encounter (INDEPENDENT_AMBULATORY_CARE_PROVIDER_SITE_OTHER): Payer: Self-pay

## 2017-01-23 ENCOUNTER — Encounter: Payer: Self-pay | Admitting: Internal Medicine

## 2017-01-23 VITALS — BP 128/68 | HR 67 | Ht 63.0 in | Wt 164.6 lb

## 2017-01-23 DIAGNOSIS — I10 Essential (primary) hypertension: Secondary | ICD-10-CM | POA: Diagnosis not present

## 2017-01-23 DIAGNOSIS — Z23 Encounter for immunization: Secondary | ICD-10-CM

## 2017-01-23 DIAGNOSIS — R0789 Other chest pain: Secondary | ICD-10-CM

## 2017-01-23 DIAGNOSIS — R0602 Shortness of breath: Secondary | ICD-10-CM

## 2017-01-23 NOTE — Patient Instructions (Signed)
Your physician has recommended you make the following change in your medication:  STOP HYDROCHLOROTHIAZIDE  START KNEE  [facility] [place] physician recommends that you schedule a follow-up appointment in: AS NEEDED

## 2017-01-23 NOTE — Progress Notes (Signed)
Follow-up Outpatient Visit Date: 01/23/2017  Primary Care Provider: Jule Ser, DO [place] [id number]  Chief Complaint: Follow-up shortness of breath  HPI:  Rose [name] is a 61 y.o. year-old female with history of hypertension and diabetes mellitus, who presents for follow-up of shortness of breath. I met her last month, at which time she reported progressive fatigue, shortness of breath, and intermittent chest pain. She also reported worsening leg edema and chronic orthopnea. We agreed to start furosemide for suspected heart failure and obtain echo and myocardial perfusion stress test (see details below).  Rose [name] did not notice any significant improvement in her symptoms with addition of furosemide. However, decline in renal function was noted, prompting [place] to discontinue furosemide. She continues to have modest shortness of breath with activity. Leg swelling fluctuates. She has not been wearing compression stockings regularly. Rose [name] denies chest pain and reports only rare palpitations that she describes as a flutter lasting 1-2 seconds. She notes occassional orthostatic lightheadedness, most commonly during the middle of the day. She has not been taking HCTZ for at least a week, as she ran out.[name]  --------------------------------------------------------------------------------------------------  Cardiovascular History & Procedures: Cardiovascular Problems:  Atypical chest pain  Shortness of breath and edema  Risk Factors:  Hypertension, diabetes mellitus, family history, tobacco abuse, and remote IV drug use.  Cath/PCI:  None  CV Surgery:  None  EP Procedures and Devices:  None  Non-Invasive Evaluation(s):  Pharmacologic MPI (01/15/17): Normal study without ischemia or infarct. LVEF 55%.  TTE (01/01/17): Normal LV size and wall thickness. LVEF 55-60% with normal wall motion. Grade 1 diastolic dysfunction. No significant valvular  abnormalities. Normal RV size and function.  Stress test reportedly normal (date unknown)  Recent CV Pertinent Labs: Lab Results  Component Value Date   CHOL 161 12/12/2015   HDL 35 (L) 12/12/2015   LDLCALC 94 12/12/2015   TRIG 159 (H) 12/12/2015   CHOLHDL 4.6 (H) 12/12/2015   INR 1.13 08/20/2012   K 3.7 01/13/2017   MG 1.8 11/04/2014   BUN 19 01/13/2017   CREATININE 1.32 (H) 01/13/2017    Past medical and surgical history were reviewed and updated in EPIC.  Current Meds  Medication Sig  . albuterol (PROVENTIL HFA;VENTOLIN HFA) 108 (90 Base) MCG/ACT inhaler Inhale 2 puffs into the lungs every 6 (six) hours as needed. For wheezing  . aspirin EC 81 MG tablet Take 1 tablet (81 mg total) by mouth daily.  [name] atenolol (TENORMIN) 50 MG tablet Take 0.5 tablets (25 mg total) by mouth daily.  [name] atorvastatin (LIPITOR) 40 MG tablet Take 1 tablet (40 mg total) by mouth daily.  . baclofen (LIORESAL) 10 MG tablet TAKE 1 TABLET BY MOUTH TWICE DAILY AS NEEDED FOR MUSCLE SPASMS  . cetirizine (ZYRTEC) 10 MG tablet Take 1 tablet (10 mg total) by mouth daily.  . DULoxetine (CYMBALTA) 30 MG capsule Take 1 capsule (30 mg total) by mouth daily.  [name] glucose blood (ACCU-CHEK AVIVA PLUS) test strip Check blood sugar 3 times a day with accu chek aviva plus strips  . hydrochlorothiazide (HYDRODIURIL) 25 MG tablet Take 1 tablet (25 mg total) by mouth daily.  Elmore Guise Devices (ACCU-CHEK SOFTCLIX) lancets Check blood sugar 3 times a day. Patient is insulin requiring, ICD 10 code E11.9.  . Lancets 30G MISC Check blood sugar 3 times a day  . metFORMIN (GLUCOPHAGE) 1000 MG tablet Take 1 tablet (1,000 mg total) by mouth 2 (two) times daily.  [name]  methadone (DOLOPHINE) 10 MG/5ML solution Take 100 mg by mouth daily.  . nicotine (NICODERM CQ - DOSED IN MG/24 HOURS) 14 mg/24hr patch Medicaid [id number] Wks 1-6: 14 mg x 1 patch daily. Wear for 24 hours. If you have sleep disturbances, remove at bedtime.  . nicotine (NICODERM  CQ - DOSED IN MG/24 HR) 7 mg/24hr patch Medicaid [id number] Wks 7-8: 7 mg x 1 patch dailyWear for 24 hours. If you have sleep disturbances, remove at bedtime..  . nicotine polacrilex (NICORETTE) 2 MG gum Medicaid [id number]. Dispense quantity sufficient, see below for instructions  . verapamil (CALAN-SR) 180 MG CR tablet Take 1 tablet (180 mg total) by mouth daily.    Allergies: Lisinopril; Gabapentin; Hydrocodone-acetaminophen; Hydrocodone-acetaminophen; and Vicodin [hydrocodone-acetaminophen]  Social History   Social History  . Marital status: Single    Spouse name: N/A  . Number of children: N/A  . Years of education: N/A   Occupational History  . Not on file.   Social History Main Topics  . Smoking status: Current Every Day Smoker    Packs/day: 0.25    Years: 20.00    Types: Cigarettes  . Smokeless tobacco: Never Used     Comment: 1 cigs per day  . Alcohol use No  . Drug use: No     Comment: methadone program  PER PT QUIT HEROIN 2006  ALSO HX MARIJUANA USE  . Sexual activity: No   Other Topics Concern  . Not on file   Social History Narrative  . No narrative on file    Family History  Problem Relation Age of Onset  . Hypertension Mother   . Diabetes type II Mother   . Heart attack Mother 60  . CAD Father   . Heart attack Father 60  . Hypertension Brother   . Diabetes type II Brother   . Heart attack Brother 45  . Diabetes type II Sister     Review of Systems: A 12-system review of systems was performed and was negative except as noted in the HPI.  --------------------------------------------------------------------------------------------------  Physical Exam: BP 128/68   Pulse 67   Ht 5' 3" (1.6 m)   Wt 164 lb 9.6 oz (74.7 kg)   LMP 07/31/2011   BMI 29.16 kg/m   General:  Overweight woman, seated comfortably in the exam room. HEENT: No conjunctival pallor or scleral icterus. Moist mucous membranes.  OP clear. Neck: Supple without lymphadenopathy,  thyromegaly, JVD, or HJR. No carotid bruit. Lungs: Normal work of breathing. Fair air movement with scatter rhonchi. No wheezes or crackles. Heart: Regular rate and rhythm without murmurs, rubs, or gallops. Non-displaced PMI. Abd: Bowel sounds present. Soft, NT/ND without hepatosplenomegaly Ext: 1+ non-pitting lower extremity edema. Radial, PT, and DP pulses are 2+ bilaterally. Skin: Warm and dry without rash.  EKG:  NSR with poor R-wave progression. No significant change from prior tracing on 12/19/16.  Lab Results  Component Value Date   WBC 11.8 (H) 06/05/2016   HGB 12.0 06/05/2016   HCT 37.3 06/05/2016   MCV 83 06/05/2016   PLT 171 06/05/2016    Lab Results  Component Value Date   NA 133 (L) 01/13/2017   K 3.7 01/13/2017   CL 94 (L) 01/13/2017   CO2 21 01/13/2017   BUN 19 01/13/2017   CREATININE 1.32 (H) 01/13/2017   GLUCOSE 151 (H) 01/13/2017   ALT 9 (L) 04/27/2016    Lab Results  Component Value Date   CHOL 161 12/12/2015   HDL 35 (L)   12/12/2015   LDLCALC 94 12/12/2015   TRIG 159 (H) 12/12/2015   CHOLHDL 4.6 (H) 12/12/2015    --------------------------------------------------------------------------------------------------  ASSESSMENT AND PLAN: Shortness of breath This is likely multifactorial. I do not believe that grade 1 diastolic dysfunction alone accounts for her symptoms. I do not believe that she is significant volume overloaded today. I suspect that venous insufficiency is contributing to her chronic leg swelling. I have recommended that Rose [name] wear compression stockings. She should follow-up with her PCP; pulmonary referral should be considered if dyspnea remains a problem. Smoking cessation was encouraged.  Hypertension Blood pressure is normal today despite the patient having missed HCTZ for at least a week. Given mild renal insufficiency, we have agreed to stop HCTZ. I will defer further management to her PCP.  Atypical chest pain No further  episodes since our last visit. Stress test was normal. No further workup at this time. Continue statin for primary prevention in the setting of diabetes mellitus.  Influenza immunization Seasonal flu vaccine administered today at the patient's request.  Follow-up: Return to clinic as needed.  Christopher End, MD 01/23/2017 9:59 AM  

## 2017-01-29 ENCOUNTER — Ambulatory Visit (INDEPENDENT_AMBULATORY_CARE_PROVIDER_SITE_OTHER): Payer: Medicare Other | Admitting: Internal Medicine

## 2017-01-29 ENCOUNTER — Encounter (INDEPENDENT_AMBULATORY_CARE_PROVIDER_SITE_OTHER): Payer: Self-pay

## 2017-01-29 VITALS — BP 98/51 | HR 68 | Temp 98.5°F | Ht 63.0 in | Wt 162.7 lb

## 2017-01-29 DIAGNOSIS — E119 Type 2 diabetes mellitus without complications: Secondary | ICD-10-CM

## 2017-01-29 DIAGNOSIS — E088 Diabetes mellitus due to underlying condition with unspecified complications: Secondary | ICD-10-CM

## 2017-01-29 DIAGNOSIS — R0600 Dyspnea, unspecified: Secondary | ICD-10-CM

## 2017-01-29 DIAGNOSIS — A630 Anogenital (venereal) warts: Secondary | ICD-10-CM | POA: Diagnosis not present

## 2017-01-29 DIAGNOSIS — M19032 Primary osteoarthritis, left wrist: Secondary | ICD-10-CM

## 2017-01-29 DIAGNOSIS — B977 Papillomavirus as the cause of diseases classified elsewhere: Secondary | ICD-10-CM

## 2017-01-29 DIAGNOSIS — Z224 Carrier of infections with a predominantly sexual mode of transmission: Secondary | ICD-10-CM | POA: Diagnosis not present

## 2017-01-29 DIAGNOSIS — F17210 Nicotine dependence, cigarettes, uncomplicated: Secondary | ICD-10-CM

## 2017-01-29 DIAGNOSIS — Z7984 Long term (current) use of oral hypoglycemic drugs: Secondary | ICD-10-CM | POA: Diagnosis not present

## 2017-01-29 DIAGNOSIS — R0602 Shortness of breath: Secondary | ICD-10-CM | POA: Diagnosis not present

## 2017-01-29 DIAGNOSIS — D72820 Lymphocytosis (symptomatic): Secondary | ICD-10-CM

## 2017-01-29 DIAGNOSIS — R0789 Other chest pain: Secondary | ICD-10-CM | POA: Diagnosis not present

## 2017-01-29 DIAGNOSIS — R809 Proteinuria, unspecified: Secondary | ICD-10-CM | POA: Diagnosis not present

## 2017-01-29 DIAGNOSIS — I10 Essential (primary) hypertension: Secondary | ICD-10-CM | POA: Diagnosis not present

## 2017-01-29 DIAGNOSIS — Z79899 Other long term (current) drug therapy: Secondary | ICD-10-CM

## 2017-01-29 DIAGNOSIS — M19039 Primary osteoarthritis, unspecified wrist: Secondary | ICD-10-CM | POA: Diagnosis not present

## 2017-01-29 DIAGNOSIS — Z794 Long term (current) use of insulin: Secondary | ICD-10-CM

## 2017-01-29 LAB — POCT GLYCOSYLATED HEMOGLOBIN (HGB A1C): Hemoglobin A1C: 5.7

## 2017-01-29 LAB — GLUCOSE, CAPILLARY: Glucose-Capillary: 121 mg/dL — ABNORMAL HIGH (ref 65–99)

## 2017-01-29 MED ORDER — METFORMIN HCL 1000 MG PO TABS: 1000.0000 mg | ORAL_TABLET | Freq: Two times a day (BID) | ORAL | 1 refills | Status: DC

## 2017-01-29 MED ORDER — HYDROCHLOROTHIAZIDE 25 MG PO TABS: 25.0000 mg | ORAL_TABLET | Freq: Every day | ORAL | 1 refills | Status: DC

## 2017-01-29 NOTE — Assessment & Plan Note (Signed)
Assessment: Likely multifactorial SOB.  She reported these symptoms to her cardiologist initially.  Given this, coupled with her symptoms of LE edema, an echo was obtained.  Fortunately, her Echo was normal and it was felt she had some venous insufficiency and potential that her tobacco history was contributing to her dyspnea.  Today, she reports great improvement in dyspnea since starting herself back on HCTZ.  She smokes about 2 cigarettes per day and utilizes her albuterol inhaler about 1 time per day or every other day.  Plan: - Consider PFT testing in the future if dyspnea worsens or persists as she may benefit from maintenance inhaler - Tobacco cessation was encouraged. - She has a prescription for compression stockings from her cardiologist

## 2017-01-29 NOTE — Assessment & Plan Note (Signed)
Assessment: She was evaluated by cardiology with normal echo and myoview stress test.  She has been reassured that her chest pain was non-cardiac in nature.  There are no plans for further work up at this time.  Plan: - Continue to follow

## 2017-01-29 NOTE — Assessment & Plan Note (Signed)
Assessment: Pap in March 2018 detected high-risk HPV with normal cytology, however, unable to obtain DNA typing for 16 or 18.  Plan: - Repeat Co-testing in 1 year (March 2019)

## 2017-01-29 NOTE — Assessment & Plan Note (Signed)
Assessment: A1c today is 5.7.  Last visit we stopped her insulin altogether.  She reports it took her about 3 weeks after our visit to actually stop taking her insulin.  She continues to take metformin 1000mg  BID without issue.  Plan: - Continue to manage DM without insulin - Continue metformin 1000mg  BID - Foot exam done today - Urine microalbumin needed in March - Due for eye exam

## 2017-01-29 NOTE — Assessment & Plan Note (Signed)
Assessment: She was asking for a new wrist brace today that she uses for chronic wrist pain.  Plan: We were able to have an ortho tech from hospital come and give her a new brace.

## 2017-01-29 NOTE — Patient Instructions (Signed)
Thank you for coming to see me today. It was a pleasure. Today we talked about:   Blood pressure: Keep taking HCTZ STOP taking Verapamil  Diabetes:  I have refilled your metformin.  Please follow-up with me in 4-6 weeks  If you have any questions or concerns, please do not hesitate to call the office at [phone].  Take Care,   Jule Ser, DO

## 2017-01-29 NOTE — Progress Notes (Signed)
CC: here for f/u of HTN, DM, HPV +, and dyspnea.  HPI:  [name] is a 61 y.o. woman with a past medical history listed below here today for follow up of her HTN, DM, HPV +, and dyspnea.  For details of today's visit and the status of her chronic medical issues please refer to the assessment and plan.   Past Medical History:  Diagnosis Date  . Anxiety   . Arthritis   . Asthma   . Chronic back pain   . Depression   . Diabetes mellitus   . Edema of lower extremity history cellulitis jan 2013  . Frequency of urination   . Hep C w/o coma, chronic ([place])   . History of chronic bronchitis   . History of drug abuse states quit herion in 2006  . Hypertension   . Methadone maintenance therapy patient ([place])   . Nocturia   . Tendonitis   . Urge urinary incontinence    Review of Systems:  Please see pertinent ROS reviewed in HPI and problem based charting.   Physical Exam:  Vitals:   01/29/17 1323  BP: (!) 98/51  Pulse: 68  Temp: 98.5 F (36.9 C)  TempSrc: Oral  SpO2: 97%  Weight: 162 lb 11.2 oz (73.8 kg)  Height: 5\' 3"  (1.6 m)   General: sitting in chair, NAD HEENT: NCAT, EOMI, no scleral icterus Cardiac: RRR Pulm: CTAB Ext: warm and well perfused, trace LE edema Neuro: alert and oriented X3, cranial nerves II-XII grossly intact   Assessment & Plan:   See Encounters Tab for problem based charting.  Patient discussed with Dr. Evette Doffing .  Essential hypertension Assessment: BP today is 98/51.  She is asymptomatic and denies lightheadedness.  She saw cardiology last week and her BP was at goal.  She had not been taking HCTZ due to being out of this medication for several days.  She was instructed to stop taking HCTZ at that time as a result.  Shortly after seeing cardiology she actually resumed this medication on her own because of her symptoms of bilateral hand tingling and continued dyspnea.  These symptoms have now resolved since resuming HCTZ and she does not  want to discontinue this medication.  She took all her medications today.  Plan: - Given her low level proteinuria and history of DM, will stop Verapamil today and continue to monitor her microablumin/creatinine ratio annually - Continue HCTZ 25mg  daily and atenolol 25mg  daily - RTC 4-6 weeks for BP follow up and BMET to follow renal function given her CKD.  If BP elevated again, consider adding back lower dose of Verapamil   Diabetes mellitus with complication, with long-term current use of insulin (HCC) Assessment: A1c today is 5.7.  Last visit we stopped her insulin altogether.  She reports it took her about 3 weeks after our visit to actually stop taking her insulin.  She continues to take metformin 1000mg  BID without issue.  Plan: - Continue to manage DM without insulin - Continue metformin 1000mg  BID - Foot exam done today - Urine microalbumin needed in March - Due for eye exam  Arthritis, degenerative Assessment: She was asking for a new wrist brace today that she uses for chronic wrist pain.  Plan: We were able to have an ortho tech from hospital come and give her a new brace.  Lymphocytosis Assessment: Absolute lymphocytosis seen on CBC initially in Jan 2018 with history of low level lymphocytosis since about 2009.  Most recent  CBC about 6 months ago close to her baseline of about 4000.  Plan: - CBC today to monitor closely in case of impending lymphoproliferative disorder.  HPV in female Assessment: Pap in March 2018 detected high-risk HPV with normal cytology, however, unable to obtain DNA typing for 16 or 18.  Plan: - Repeat Co-testing in 1 year (March 2019)  Atypical chest pain Assessment: She was evaluated by cardiology with normal echo and myoview stress test.  She has been reassured that her chest pain was non-cardiac in nature.  There are no plans for further work up at this time.  Plan: - Continue to follow  Shortness of breath Assessment: Likely  multifactorial SOB.  She reported these symptoms to her cardiologist initially.  Given this, coupled with her symptoms of LE edema, an echo was obtained.  Fortunately, her Echo was normal and it was felt she had some venous insufficiency and potential that her tobacco history was contributing to her dyspnea.  Today, she reports great improvement in dyspnea since starting herself back on HCTZ.  She smokes about 2 cigarettes per day and utilizes her albuterol inhaler about 1 time per day or every other day.  Plan: - Consider PFT testing in the future if dyspnea worsens or persists as she may benefit from maintenance inhaler - Tobacco cessation was encouraged. - She has a prescription for compression stockings from her cardiologist

## 2017-01-29 NOTE — Assessment & Plan Note (Signed)
Assessment: BP today is 98/51.  She is asymptomatic and denies lightheadedness.  She saw cardiology last week and her BP was at goal.  She had not been taking HCTZ due to being out of this medication for several days.  She was instructed to stop taking HCTZ at that time as a result.  Shortly after seeing cardiology she actually resumed this medication on her own because of her symptoms of bilateral hand tingling and continued dyspnea.  These symptoms have now resolved since resuming HCTZ and she does not want to discontinue this medication.  She took all her medications today.  Plan: - Given her low level proteinuria and history of DM, will stop Verapamil today and continue to monitor her microablumin/creatinine ratio annually - Continue HCTZ 25mg  daily and atenolol 25mg  daily - RTC 4-6 weeks for BP follow up and BMET to follow renal function given her CKD.  If BP elevated again, consider adding back lower dose of Verapamil

## 2017-01-29 NOTE — Assessment & Plan Note (Signed)
Assessment: Absolute lymphocytosis seen on CBC initially in Jan 2018 with history of low level lymphocytosis since about 2009.  Most recent CBC about 6 months ago close to her baseline of about 4000.  Plan: - CBC today to monitor closely in case of impending lymphoproliferative disorder.

## 2017-01-30 LAB — CBC WITH DIFFERENTIAL/PLATELET
Basophils Absolute: 0 10*3/uL (ref 0.0–0.2)
Basos: 0 %
EOS (ABSOLUTE): 0.1 10*3/uL (ref 0.0–0.4)
Eos: 1 %
Hematocrit: 33 % — ABNORMAL LOW (ref 34.0–46.6)
Hemoglobin: 10.7 g/dL — ABNORMAL LOW (ref 11.1–15.9)
Immature Grans (Abs): 0 10*3/uL (ref 0.0–0.1)
Immature Granulocytes: 0 %
Lymphocytes Absolute: 3.9 10*3/uL — ABNORMAL HIGH (ref 0.7–3.1)
Lymphs: 34 %
MCH: 27.6 pg (ref 26.6–33.0)
MCHC: 32.4 g/dL (ref 31.5–35.7)
MCV: 85 fL (ref 79–97)
Monocytes Absolute: 0.6 10*3/uL (ref 0.1–0.9)
Monocytes: 5 %
Neutrophils Absolute: 7 10*3/uL (ref 1.4–7.0)
Neutrophils: 60 %
Platelets: 180 10*3/uL (ref 150–379)
RBC: 3.87 x10E6/uL (ref 3.77–5.28)
RDW: 12.9 % (ref 12.3–15.4)
WBC: 11.7 10*3/uL — ABNORMAL HIGH (ref 3.4–10.8)

## 2017-01-30 NOTE — Progress Notes (Signed)
Internal Medicine Clinic Attending  Case discussed with Dr. Wallace at the time of the visit.  We reviewed the resident's history and exam and pertinent patient test results.  I agree with the assessment, diagnosis, and plan of care documented in the resident's note.  

## 2017-02-09 ENCOUNTER — Ambulatory Visit: Payer: Medicare Other | Admitting: Podiatry

## 2017-02-16 ENCOUNTER — Other Ambulatory Visit: Payer: Self-pay | Admitting: *Deleted

## 2017-02-17 MED ORDER — GLUCOSE BLOOD VI STRP: ORAL_STRIP | 5 refills | Status: DC

## 2017-03-02 ENCOUNTER — Encounter: Payer: Self-pay | Admitting: Podiatry

## 2017-03-02 ENCOUNTER — Ambulatory Visit (INDEPENDENT_AMBULATORY_CARE_PROVIDER_SITE_OTHER): Payer: Medicare Other | Admitting: Podiatry

## 2017-03-02 DIAGNOSIS — M79674 Pain in right toe(s): Secondary | ICD-10-CM

## 2017-03-02 DIAGNOSIS — B351 Tinea unguium: Secondary | ICD-10-CM | POA: Diagnosis not present

## 2017-03-02 DIAGNOSIS — M79675 Pain in left toe(s): Secondary | ICD-10-CM

## 2017-03-02 DIAGNOSIS — E0842 Diabetes mellitus due to underlying condition with diabetic polyneuropathy: Secondary | ICD-10-CM

## 2017-03-02 NOTE — Patient Instructions (Signed)

## 2017-03-02 NOTE — Progress Notes (Signed)
Patient ID: Rose [name], female   DOB: [birth year], 61 y.o.   MRN: [id number]    Subjective: This patient presents for follow-up visit of 11/18/2016 she states she had a great deal relief when wearing the pads attach her insoles. She also states that her heel pain has reduced significantly. Patient is also complaining of uncomfortable toenails walking wearing shoes and request toenail debridement as well today Patient is a known diabetic without history of ulceration, claudication or amputation Patient current smoker  Objective: Orientated 3  Vascular: No peripheral edema bilaterally DP and PT pulses 2/4 bilaterally Capillary reflex immediate bilaterally  Neurological: Sensation to 10 g monofilament wire intact 4/5 right 2/5 left Vibratory sensation reactive bilaterally Ankle reflex equal and reactive bilaterally  Dermatological: No open skin lesions bilaterally The toenails are elongated, brittle, deformed, discolored and tender direct palpation 6-10  Musculoskeletal: Limited range of motion subtalar, midtarsal joints bilaterally Manual motor testing: Dorsi flexion, plantar flexion, inversion, eversion 5/5 bilaterally Patient uses a cane when walking Palpable tenderness medial plantar fascial insertional area bilaterally without any palpable lesions Palpable tenderness plantar 1-5 MPJ bilaterally Tenderness on range of motion 1-5 MPJ bilaterally without crepitus  X-ray examination weightbearing right foot dated 11/18/2016  Intact bony structures without fracture and/or dislocation Decreased bone density noted throughout all views Decrease joint space Lisfranc articulation Hammertoe second  Radiographic impression: No acute bony abnormality noted weightbearing x-ray right foot Midfoot osteoarthritis right  X-ray examination weightbearing left foot dated 11/18/2016  Intact bony structures without fracture and/or dislocation Decreased bone density noted  throughout all views Decrease joint space Lisfranc articulation Hammertoe second  Radiographic impression: No acute bony abnormality noted weightbearing x-ray right foot Midfoot osteoarthritis right Assessment: Plantar fasciitis bilaterally,Improving Capsulitis 1-5/metatarsalgia bilaterally, improving Symptomatic mycotic toenails 6-10  Plan: Debridement of toenails 6-10 mechanically electrical without any bleeding Return for accommodative foot orthotic with metatarsal raise 2-4 to treat metatarsalgia and Semirigid flexible full length orthotic in order treatment bilateral fasciitis  Return for impression for prescription foot orthotics reschedule again today's visit  Reappoint 2 months

## 2017-03-05 ENCOUNTER — Ambulatory Visit (INDEPENDENT_AMBULATORY_CARE_PROVIDER_SITE_OTHER): Payer: Medicare Other | Admitting: Internal Medicine

## 2017-03-05 ENCOUNTER — Other Ambulatory Visit: Payer: Self-pay

## 2017-03-05 ENCOUNTER — Encounter: Payer: Self-pay | Admitting: Internal Medicine

## 2017-03-05 VITALS — BP 123/68 | HR 67 | Temp 98.4°F | Ht 63.0 in | Wt 161.1 lb

## 2017-03-05 DIAGNOSIS — I10 Essential (primary) hypertension: Secondary | ICD-10-CM | POA: Diagnosis not present

## 2017-03-05 DIAGNOSIS — Z9109 Other allergy status, other than to drugs and biological substances: Secondary | ICD-10-CM

## 2017-03-05 MED ORDER — CETIRIZINE HCL 10 MG PO TABS: 10.0000 mg | ORAL_TABLET | Freq: Every day | ORAL | 3 refills | Status: DC

## 2017-03-05 MED ORDER — ATORVASTATIN CALCIUM 40 MG PO TABS: 40.0000 mg | ORAL_TABLET | Freq: Every day | ORAL | 3 refills | Status: DC

## 2017-03-05 MED ORDER — FLUTICASONE PROPIONATE 50 MCG/ACT NA SUSP: 2.0000 | Freq: Every day | NASAL | 2 refills | Status: DC

## 2017-03-05 NOTE — Patient Instructions (Signed)
FOLLOW-UP INSTRUCTIONS When: in March For: Pap smear and Diabetes follow up What to bring: your medications  You're doing a great job with the diabetes and blood pressure.  Keep up the great work.  Continue to work hard at quitting smoking!

## 2017-03-05 NOTE — Assessment & Plan Note (Signed)
A: Her allergies have been worse in the past few weeks and she reports increased congestion, rhinorrhea, and ear drainage.  She does not report fever, chills, increased dyspnea.   P: - refill given for her cetirizine - added fluticasone nasal spray to aid in congestion relief - rtc if symptoms do not improve

## 2017-03-05 NOTE — Assessment & Plan Note (Signed)
BP Readings from Last 3 Encounters:  03/05/17 123/68  01/29/17 (!) 98/51  01/23/17 128/68   BP today is at goal with HCTZ and atenolol after changes that were made on Nov 1.  No complaints of dyspnea, LE edema, Chest pain.  P: - continue atenolol and HCTZ at current doses - check BMET today to evaluate renal function

## 2017-03-05 NOTE — Progress Notes (Signed)
   CC: here for BP f/u  HPI:  [name] is a 61 y.o. woman with a past medical history listed below here today for follow up of her HTN.  For details of today's visit and the status of her chronic medical issues please refer to the assessment and plan.   Past Medical History:  Diagnosis Date  . Anxiety   . Arthritis   . Asthma   . Chronic back pain   . Depression   . Diabetes mellitus   . Edema of lower extremity history cellulitis jan 2013  . Frequency of urination   . Hep C w/o coma, chronic ([place])   . History of chronic bronchitis   . History of drug abuse states quit herion in 2006  . Hypertension   . Methadone maintenance therapy patient ([place])   . Nocturia   . Tendonitis   . Urge urinary incontinence    Review of Systems:  Please see pertinent ROS reviewed in HPI and problem based charting.   Physical Exam:  Vitals:   03/05/17 1334  BP: 123/68  Pulse: 67  Temp: 98.4 F (36.9 C)  TempSrc: Oral  SpO2: 97%  Weight: 161 lb 1.6 oz (73.1 kg)  Height: 5\' 3"  (1.6 m)   General: sitting in chair, NAD HEENT: NCAT, EOMI, no scleral icterus Cardiac: RRR Pulm: CTAB Neuro: alert and oriented X3, cranial nerves II-XII grossly intact   Assessment & Plan:   See Encounters Tab for problem based charting.  Patient discussed with Dr. Dareen Piano .  Essential hypertension BP Readings from Last 3 Encounters:  03/05/17 123/68  01/29/17 (!) 98/51  01/23/17 128/68   BP today is at goal with HCTZ and atenolol after changes that were made on Nov 1.  No complaints of dyspnea, LE edema, Chest pain.  P: - continue atenolol and HCTZ at current doses - check BMET today to evaluate renal function  Environmental allergies A: Her allergies have been worse in the past few weeks and she reports increased congestion, rhinorrhea, and ear drainage.  She does not report fever, chills, increased dyspnea.   P: - refill given for her cetirizine - added fluticasone nasal spray to  aid in congestion relief - rtc if symptoms do not improve

## 2017-03-06 LAB — BMP8+ANION GAP
Anion Gap: 17 mmol/L (ref 10.0–18.0)
BUN/Creatinine Ratio: 12 (ref 9–23)
BUN: 18 mg/dL (ref 6–24)
CO2: 23 mmol/L (ref 20–29)
Calcium: 9.3 mg/dL (ref 8.7–10.2)
Chloride: 101 mmol/L (ref 96–106)
Creatinine, Ser: 1.54 mg/dL — ABNORMAL HIGH (ref 0.57–1.00)
GFR calc Af Amer: 42 mL/min/{1.73_m2} — ABNORMAL LOW (ref >59)
GFR calc non Af Amer: 37 mL/min/{1.73_m2} — ABNORMAL LOW (ref >59)
Glucose: 90 mg/dL (ref 65–99)
Potassium: 4 mmol/L (ref 3.5–5.2)
Sodium: 141 mmol/L (ref 134–144)

## 2017-03-11 ENCOUNTER — Other Ambulatory Visit: Payer: Medicare Other | Admitting: Orthotics

## 2017-03-13 ENCOUNTER — Other Ambulatory Visit: Payer: Medicare Other | Admitting: Orthotics

## 2017-04-01 NOTE — Progress Notes (Signed)
Internal Medicine Clinic Attending  Case discussed with Dr. Wallace soon after the resident saw the patient.  We reviewed the resident's history and exam and pertinent patient test results.  I agree with the assessment, diagnosis, and plan of care documented in the resident's note. 

## 2017-04-22 ENCOUNTER — Other Ambulatory Visit: Payer: Self-pay

## 2017-04-22 ENCOUNTER — Encounter: Payer: Self-pay | Admitting: Internal Medicine

## 2017-04-22 ENCOUNTER — Ambulatory Visit (INDEPENDENT_AMBULATORY_CARE_PROVIDER_SITE_OTHER): Payer: Medicare Other | Admitting: Internal Medicine

## 2017-04-22 DIAGNOSIS — J019 Acute sinusitis, unspecified: Secondary | ICD-10-CM

## 2017-04-22 DIAGNOSIS — Z720 Tobacco use: Secondary | ICD-10-CM

## 2017-04-22 DIAGNOSIS — B9689 Other specified bacterial agents as the cause of diseases classified elsewhere: Secondary | ICD-10-CM

## 2017-04-22 MED ORDER — AMOXICILLIN-POT CLAVULANATE 875-125 MG PO TABS: 1.0000 | ORAL_TABLET | Freq: Two times a day (BID) | ORAL | 0 refills | Status: AC

## 2017-04-22 NOTE — Progress Notes (Signed)
   CC: Cold symptoms   HPI:  Rose [name] is a 62 y.o. F with a past medical history as described below who presents to the clinic with complaints of cold symptoms.   She reports a roughly 13 day history of symptoms of cough, nasal congestion and rhinorrhea, aches, subjective fever and chills, and decreased appetite.  Cough was initially productive of white/yellow sputum.  She has tried various over-the-counter medications including NyQuil, DayQuil, Tylenol, cough syrup without significant relief of her symptoms.  She denies vomiting, states she has been getting gassy with food lately.  She notes that her symptoms began to improve around 1/15 and then returned again on the 17th and were more intense at that time.  She states her cough has improved and has been dry, but congestion and general aches have continued.  After previously being down to around 2 cigarettes/day she has recently increased to 4-5 cigarettes a day.  Past Medical History:  Diagnosis Date  . Anxiety   . Arthritis   . Asthma   . Chronic back pain   . Depression   . Diabetes mellitus   . Edema of lower extremity history cellulitis jan 2013  . Frequency of urination   . Hep C w/o coma, chronic ([place])   . History of chronic bronchitis   . History of drug abuse states quit herion in 2006  . Hypertension   . Methadone maintenance therapy patient ([place])   . Nocturia   . Tendonitis   . Urge urinary incontinence    Review of Systems:  Review of Systems  Constitutional: Positive for chills.  HENT: Positive for congestion and sore throat.   Respiratory: Positive for cough.   Gastrointestinal: Positive for nausea. Negative for vomiting.  Musculoskeletal: Positive for myalgias.     Physical Exam:  Vitals:   04/22/17 1323  BP: (!) 160/65  Pulse: 69  Temp: 98.3 F (36.8 C)  TempSrc: Oral  SpO2: 99%  Weight: 159 lb 12.8 oz (72.5 kg)  Height: 5\' 4"  (1.626 m)   General: Sitting in chair comfortably, no acute  distress HEENT: R TM clear with good light reflex, unable to fully visualize L due to cerumen, normal conjuctiva, moist mucus membranes, no pharyngeal exudate. L>R maxillary tenderness to palpation  CV: RRR, no murmur appreciated  Resp: CTAB without wheeze or crackles, normal work of breathing, no distress  Neuro: Alert and oriented x3 Skin: Warm, dry      Assessment & Plan:   See Encounters Tab for problem based charting.  Patient discussed with Dr. Angelia Mould

## 2017-04-22 NOTE — Patient Instructions (Signed)
Nice to meet you today [name].  You likely had a virus which caused her cold and then a bacteria took that opportunity to cause an infection in your sinuses.  We are prescribing an antibiotic which you will take for 5 days to help clear this up.  You can continue Tylenol and the other over-the-counter medicines to also help control your symptoms.

## 2017-04-22 NOTE — Assessment & Plan Note (Signed)
Patient presenting with characteristic history of viral URI symptoms with initial improvement followed by worsening and an overall duration greater than 10 days consistent with acute bacterial rhinosinusitis.  We will treat with 5-day course of Augmentin.  No dose adjustment needed with GFR greater than 30.  --Augmentin 875-125 mg BID x 5 days

## 2017-04-23 ENCOUNTER — Other Ambulatory Visit: Payer: Self-pay | Admitting: Internal Medicine

## 2017-04-23 DIAGNOSIS — E113292 Type 2 diabetes mellitus with mild nonproliferative diabetic retinopathy without macular edema, left eye: Secondary | ICD-10-CM | POA: Diagnosis not present

## 2017-04-23 DIAGNOSIS — H04123 Dry eye syndrome of bilateral lacrimal glands: Secondary | ICD-10-CM | POA: Diagnosis not present

## 2017-04-23 DIAGNOSIS — H35033 Hypertensive retinopathy, bilateral: Secondary | ICD-10-CM | POA: Diagnosis not present

## 2017-04-23 DIAGNOSIS — Z1231 Encounter for screening mammogram for malignant neoplasm of breast: Secondary | ICD-10-CM

## 2017-04-23 DIAGNOSIS — H10812 Pingueculitis, left eye: Secondary | ICD-10-CM | POA: Diagnosis not present

## 2017-04-23 DIAGNOSIS — E119 Type 2 diabetes mellitus without complications: Secondary | ICD-10-CM | POA: Diagnosis not present

## 2017-04-23 LAB — HM DIABETES EYE EXAM

## 2017-04-24 ENCOUNTER — Ambulatory Visit
Admission: RE | Admit: 2017-04-24 | Discharge: 2017-04-24 | Disposition: A | Discharge status: Home / Self Care | Payer: Medicare Other | Source: Ambulatory Visit | Attending: *Deleted | Admitting: *Deleted

## 2017-04-24 DIAGNOSIS — Z1231 Encounter for screening mammogram for malignant neoplasm of breast: Secondary | ICD-10-CM

## 2017-04-27 NOTE — Progress Notes (Signed)
Internal Medicine Clinic Attending  Case discussed with Dr. Harden at the time of the visit.  We reviewed the resident's history and exam and pertinent patient test results.  I agree with the assessment, diagnosis, and plan of care documented in the resident's note.  

## 2017-04-28 ENCOUNTER — Encounter: Payer: Self-pay | Admitting: *Deleted

## 2017-05-04 ENCOUNTER — Ambulatory Visit: Payer: Medicare Other | Admitting: Orthotics

## 2017-05-07 ENCOUNTER — Ambulatory Visit: Payer: Medicare Other | Admitting: Orthotics

## 2017-05-12 ENCOUNTER — Ambulatory Visit: Payer: Medicare Other

## 2017-05-13 DIAGNOSIS — H04123 Dry eye syndrome of bilateral lacrimal glands: Secondary | ICD-10-CM | POA: Diagnosis not present

## 2017-05-13 DIAGNOSIS — H10812 Pingueculitis, left eye: Secondary | ICD-10-CM | POA: Diagnosis not present

## 2017-05-19 ENCOUNTER — Ambulatory Visit (INDEPENDENT_AMBULATORY_CARE_PROVIDER_SITE_OTHER): Payer: Medicare Other | Admitting: Orthotics

## 2017-05-19 DIAGNOSIS — E1142 Type 2 diabetes mellitus with diabetic polyneuropathy: Secondary | ICD-10-CM

## 2017-05-19 DIAGNOSIS — M722 Plantar fascial fibromatosis: Secondary | ICD-10-CM

## 2017-05-19 DIAGNOSIS — E0842 Diabetes mellitus due to underlying condition with diabetic polyneuropathy: Secondary | ICD-10-CM | POA: Diagnosis not present

## 2017-05-19 NOTE — Progress Notes (Signed)

## 2017-05-26 ENCOUNTER — Other Ambulatory Visit: Payer: Self-pay | Admitting: Internal Medicine

## 2017-06-01 ENCOUNTER — Ambulatory Visit: Payer: Medicare Other | Admitting: Podiatry

## 2017-06-02 ENCOUNTER — Other Ambulatory Visit: Payer: Self-pay | Admitting: Internal Medicine

## 2017-06-02 ENCOUNTER — Ambulatory Visit (INDEPENDENT_AMBULATORY_CARE_PROVIDER_SITE_OTHER): Payer: Medicare Other | Admitting: Podiatry

## 2017-06-02 ENCOUNTER — Encounter: Payer: Self-pay | Admitting: Podiatry

## 2017-06-02 ENCOUNTER — Encounter: Payer: Self-pay | Admitting: Internal Medicine

## 2017-06-02 ENCOUNTER — Ambulatory Visit (INDEPENDENT_AMBULATORY_CARE_PROVIDER_SITE_OTHER): Payer: Medicare Other | Admitting: Internal Medicine

## 2017-06-02 VITALS — BP 139/64 | HR 72 | Temp 98.4°F | Wt 155.3 lb

## 2017-06-02 DIAGNOSIS — B351 Tinea unguium: Secondary | ICD-10-CM

## 2017-06-02 DIAGNOSIS — E0842 Diabetes mellitus due to underlying condition with diabetic polyneuropathy: Secondary | ICD-10-CM | POA: Diagnosis not present

## 2017-06-02 DIAGNOSIS — M549 Dorsalgia, unspecified: Secondary | ICD-10-CM

## 2017-06-02 DIAGNOSIS — E1169 Type 2 diabetes mellitus with other specified complication: Secondary | ICD-10-CM

## 2017-06-02 DIAGNOSIS — M79675 Pain in left toe(s): Secondary | ICD-10-CM | POA: Diagnosis not present

## 2017-06-02 DIAGNOSIS — M79671 Pain in right foot: Secondary | ICD-10-CM

## 2017-06-02 DIAGNOSIS — Z720 Tobacco use: Secondary | ICD-10-CM

## 2017-06-02 DIAGNOSIS — E1140 Type 2 diabetes mellitus with diabetic neuropathy, unspecified: Secondary | ICD-10-CM | POA: Insufficient documentation

## 2017-06-02 DIAGNOSIS — R6889 Other general symptoms and signs: Secondary | ICD-10-CM | POA: Insufficient documentation

## 2017-06-02 DIAGNOSIS — M79672 Pain in left foot: Secondary | ICD-10-CM | POA: Diagnosis not present

## 2017-06-02 DIAGNOSIS — G8929 Other chronic pain: Secondary | ICD-10-CM

## 2017-06-02 DIAGNOSIS — L853 Xerosis cutis: Secondary | ICD-10-CM | POA: Diagnosis not present

## 2017-06-02 DIAGNOSIS — E1149 Type 2 diabetes mellitus with other diabetic neurological complication: Secondary | ICD-10-CM

## 2017-06-02 DIAGNOSIS — Z9109 Other allergy status, other than to drugs and biological substances: Secondary | ICD-10-CM | POA: Diagnosis not present

## 2017-06-02 DIAGNOSIS — M545 Low back pain, unspecified: Secondary | ICD-10-CM

## 2017-06-02 DIAGNOSIS — Z7982 Long term (current) use of aspirin: Secondary | ICD-10-CM

## 2017-06-02 DIAGNOSIS — Z79899 Other long term (current) drug therapy: Secondary | ICD-10-CM

## 2017-06-02 DIAGNOSIS — M79674 Pain in right toe(s): Secondary | ICD-10-CM

## 2017-06-02 DIAGNOSIS — E785 Hyperlipidemia, unspecified: Secondary | ICD-10-CM

## 2017-06-02 MED ORDER — CETIRIZINE HCL 10 MG PO TABS: 10.0000 mg | ORAL_TABLET | Freq: Every day | ORAL | 3 refills | Status: DC

## 2017-06-02 MED ORDER — DULOXETINE HCL 60 MG PO CPEP: 60.0000 mg | ORAL_CAPSULE | Freq: Every day | ORAL | 2 refills | Status: DC

## 2017-06-02 MED ORDER — ATORVASTATIN CALCIUM 40 MG PO TABS: 40.0000 mg | ORAL_TABLET | Freq: Every day | ORAL | 3 refills | Status: DC

## 2017-06-02 MED ORDER — TIZANIDINE HCL 4 MG PO TABS: 4.0000 mg | ORAL_TABLET | Freq: Three times a day (TID) | ORAL | 1 refills | Status: DC | PRN

## 2017-06-02 NOTE — Assessment & Plan Note (Signed)
Refill provided on her cetirizine today.

## 2017-06-02 NOTE — Assessment & Plan Note (Signed)
Refill provided on her atorvastatin today.

## 2017-06-02 NOTE — Progress Notes (Signed)
CC: here for pain in her feet.  HPI:  Rose [name] is a 62 y.o. woman with a past medical history listed below here today for follow up of her diabetic nerve pain, dry skin, and medication refills.  For details of today's visit and the status of her chronic medical issues please refer to the assessment and plan.   Past Medical History:  Diagnosis Date  . Anxiety   . Arthritis   . Asthma   . Chronic back pain   . Depression   . Diabetes mellitus   . Edema of lower extremity history cellulitis jan 2013  . Frequency of urination   . Hep C w/o coma, chronic ([place])   . History of chronic bronchitis   . History of drug abuse states quit herion in 2006  . Hypertension   . Methadone maintenance therapy patient ([place])   . Nocturia   . Tendonitis   . Urge urinary incontinence    Review of Systems:  Please see pertinent ROS reviewed in HPI and problem based charting.   Physical Exam:  Vitals:   06/02/17 1348  BP: 139/64  Pulse: 72  Temp: 98.4 F (36.9 C)  TempSrc: Oral  SpO2: 98%  Weight: 155 lb 4.8 oz (70.4 kg)   Physical Exam  Constitutional: She is oriented to person, place, and time and well-developed, well-nourished, and in no distress.  HENT:  Head: Normocephalic and atraumatic.  Cardiovascular: Intact distal pulses.  Musculoskeletal: She exhibits no edema.  Neurological: She is alert and oriented to person, place, and time.  Skin:  She has evidence of old scarring on her bilateral arms that she says is related to old drug use. She has dryness of her skin around her forearms and elbows.  There is no obvious plaques. Bilateral feet show no evidence of open ulcer or wound. Toe nails are with evidence of onychomycosis.  Psychiatric: Mood and affect normal.     Assessment & Plan:   See Encounters Tab for problem based charting.  Patient discussed with Dr. Evette Doffing.  Environmental allergies Refill provided on her cetirizine today.  Hyperlipidemia  associated with type 2 diabetes mellitus ([place]) Refill provided on her atorvastatin today.  Chronic back pain Patient takes Baclofen as needed for muscle spasm for back pain but has been recommended by her provider at [facility] clinic that Tizanidine may be more effective.  She is interested in switching therapy.  Plan: - Stop Baclofen - Trial of Tizanidine 4mg  Q8H as needed.  Dry skin She has some dryness of her forearms and elbows.  - Recommended trial of Eucerin cream.  Diabetic neuropathy (HCC) She has pain in her bilateral feet (left > right) described as burning and aching "like crazy".  Symptoms are worse at night and involves whole foot from toe to heels.  She has no open wounds or ulcers on her feet.  She describes occasional symptoms related to activity but does not seem to limit her much.  She was seen by podiatry over past several months for pain in her feet and given orthotics and more supportive shoes which have helped tremendously but this pain is different than what she initially saw podiatry for.  She is on duloxetine 30mg  [name] for her neuropathic pain after not tolerating gabapentin or Lyrica.  - Increase duloxetine to 60mg  [name] and titrate further as needed - ABI done in clinic were abnormal.  See further A&P under "Abnormal ABI" problem - RTC in 4-6 weeks  Abnormal ankle  brachial index (ABI) She presented today for bilateral feet pain with left greater than right.  Although she does not describe classic claudication symptoms in her calves or thighs, there is some component that appears to be related to activity that is somewhat difficult to discern if related to diabetic nerve pain versus peripheral vascular disease.  Given her risk factors of HTN, HLD, DM, and tobacco use we obtained screening ABI in the clinic.  These were abnormal on the left with a reading of 0.68.  The right was normal at 1.08.  - she is already on a good regimen of ASA and statin - she is  encouraged to quit smoking - DM and BP control - regular exercise encouraged - referral made for vascular evaluation of PVD to determine if there is any significant obstruction that can be intervened upon that may provide relief.

## 2017-06-02 NOTE — Assessment & Plan Note (Signed)
She has some dryness of her forearms and elbows.  - Recommended trial of Eucerin cream.

## 2017-06-02 NOTE — Assessment & Plan Note (Signed)
She has pain in her bilateral feet (left > right) described as burning and aching "like crazy".  Symptoms are worse at night and involves whole foot from toe to heels.  She has no open wounds or ulcers on her feet.  She describes occasional symptoms related to activity but does not seem to limit her much.  She was seen by podiatry over past several months for pain in her feet and given orthotics and more supportive shoes which have helped tremendously but this pain is different than what she initially saw podiatry for.  She is on duloxetine 30mg  daily for her neuropathic pain after not tolerating gabapentin or Lyrica.  - Increase duloxetine to 60mg  daily and titrate further as needed - ABI done in clinic were abnormal.  See further A&P under "Abnormal ABI" problem - RTC in 4-6 weeks

## 2017-06-02 NOTE — Progress Notes (Signed)
Complaint:  Visit Type: Patient returns to my office for continued preventative foot care services. Complaint: Patient states" my nails have grown long and thick and become painful to walk and wear shoes" Patient has been diagnosed with DM with neuropathy.. The patient presents for preventative foot care services. No changes to ROS.  Patient says her insoles have helped limit her foot pain.  Podiatric Exam: Vascular: dorsalis pedis and posterior tibial pulses are weakly  palpable bilateral. Capillary return is immediate. Cold feet noted. Skin turgor WNL  Sensorium: Diminished  Semmes Weinstein monofilament test. Normal tactile sensation bilaterally. Nail Exam: Pt has thick disfigured discolored nails with subungual debris noted bilateral entire nail hallux through fifth toenails Ulcer Exam: There is no evidence of ulcer or pre-ulcerative changes or infection. Orthopedic Exam: Muscle tone and strength are WNL. No limitations in general ROM. No crepitus or effusions noted. Foot type and digits show no abnormalities. Bony prominences are unremarkable. Skin: No Porokeratosis. No infection or ulcers  Diagnosis:  Onychomycosis, , Pain in right toe, pain in left toes  Treatment & Plan Procedures and Treatment: Consent by patient was obtained for treatment procedures.   Debridement of mycotic and hypertrophic toenails, 1 through 5 bilateral and clearing of subungual debris. No ulceration, no infection noted.  Discussed peripheral neuropathy with patient and she is allergic to medicine for her neuropathy. Return Visit-Office Procedure: Patient instructed to return to the office for a follow up visit 3 months for continued evaluation and treatment.    Gardiner Barefoot DPM

## 2017-06-02 NOTE — Patient Instructions (Addendum)
FOLLOW-UP INSTRUCTIONS When: 4-6 weeks with me For: diabetic foot pain and diabetes What to bring:  Current medications  I have refilled your cetirizine and atorvastatin.    We have started tizanidine in place of baclofen for muscle spasm.  For dry skin, dry using Eucerin cream every day to help and see if this clears things up.  For foot pain, please increase your duloxetine to 60mg  daily.  You can take 2 pills of the 30mg  until you pick up your new prescription.  I have also referred you to be evaluated more thoroughly regarding the blood vessels in your legs to make sure you do not have any significant blockages.

## 2017-06-02 NOTE — Assessment & Plan Note (Signed)
Patient takes Baclofen as needed for muscle spasm for back pain but has been recommended by her provider at Meth[facility]one clinic that Tizanidine may be more effective.  She is interested in switching therapy.  Plan: - Stop Baclofen - Trial of Tizanidine 4mg  Q8H as needed.

## 2017-06-02 NOTE — Addendum Note (Signed)
Addended by: Mignon Pine on: 06/02/2017 03:07 PM   Modules accepted: Orders

## 2017-06-02 NOTE — Assessment & Plan Note (Signed)
She presented today for bilateral feet pain with left greater than right.  Although she does not describe classic claudication symptoms in her calves or thighs, there is some component that appears to be related to activity that is somewhat difficult to discern if related to diabetic nerve pain versus peripheral vascular disease.  Given her risk factors of HTN, HLD, DM, and tobacco use we obtained screening ABI in the clinic.  These were abnormal on the left with a reading of 0.68.  The right was normal at 1.08.  - she is already on a good regimen of ASA and statin - she is encouraged to quit smoking - DM and BP control - regular exercise encouraged - referral made for vascular evaluation of PVD to determine if there is any significant obstruction that can be intervened upon that may provide relief.

## 2017-06-03 NOTE — Progress Notes (Signed)
Internal Medicine Clinic Attending  Case discussed with Dr. Wallace at the time of the visit.  We reviewed the resident's history and exam and pertinent patient test results.  I agree with the assessment, diagnosis, and plan of care documented in the resident's note.  

## 2017-06-04 ENCOUNTER — Encounter: Payer: Medicare Other | Admitting: Internal Medicine

## 2017-07-07 ENCOUNTER — Encounter: Payer: Self-pay | Admitting: Cardiovascular Disease

## 2017-07-07 ENCOUNTER — Ambulatory Visit (INDEPENDENT_AMBULATORY_CARE_PROVIDER_SITE_OTHER): Payer: Medicare Other | Admitting: Cardiovascular Disease

## 2017-07-07 VITALS — BP 140/72 | HR 71 | Ht 63.0 in | Wt 158.6 lb

## 2017-07-07 DIAGNOSIS — Z720 Tobacco use: Secondary | ICD-10-CM | POA: Diagnosis not present

## 2017-07-07 DIAGNOSIS — I739 Peripheral vascular disease, unspecified: Secondary | ICD-10-CM | POA: Diagnosis not present

## 2017-07-07 DIAGNOSIS — I10 Essential (primary) hypertension: Secondary | ICD-10-CM | POA: Diagnosis not present

## 2017-07-07 DIAGNOSIS — E785 Hyperlipidemia, unspecified: Secondary | ICD-10-CM

## 2017-07-07 NOTE — Patient Instructions (Signed)
Medication Instructions:  No medication changes    Testing/Procedures: Your physician has requested that you have a lower extremity arterial duplex.  Follow-Up: .Your physician recommends that you schedule a follow-up appointment in: 3 months with dr. Fletcher Anon   Any Other Special Instructions Will Be Listed Below (If Applicable).     If you need a refill on your cardiac medications before your next appointment, please call your pharmacy.

## 2017-07-07 NOTE — Progress Notes (Signed)
Cardiology Office Note   Date:  07/08/2017   ID:  Rose [name], Rose [name], MRN [id number]  PCP:  Jule Ser, DO  Cardiologist:   [name], MD   No chief complaint on file.     History of Present Illness: Rose [name] is a 62 y.o. female who was referred by Dr. Juleen China for evaluation and management of possible peripheral arterial disease.  The patient has multiple chronic medical conditions that include type 2 diabetes, essential hypertension, hyperlipidemia and tobacco use.  She was seen last year by Dr. Saunders Revel for evaluation of exertional dyspnea and atypical chest pain.  She underwent cardiac workup that included a pharmacologic nuclear stress test which was negative for ischemia.  Echocardiogram was also unremarkable with normal ejection fraction and grade 1 diastolic dysfunction. The patient complained of some bilateral leg pain recently and thus underwent a screening ABI  which was abnormal.  She was thus referred.  She reports bilateral calf pain after walking about 2-3 blocks.  The distance can be variable and typically she is able to walk through the pain without having to stop.  The symptoms started about 1 year ago.  She also describes discomfort in the bottom of the feet with numbness.  She has no lower extremity ulceration and no rest pain.   Past Medical History:  Diagnosis Date  . Anxiety   . Arthritis   . Asthma   . Chronic back pain   . Depression   . Diabetes mellitus   . Edema of lower extremity history cellulitis jan 2013  . Frequency of urination   . Hep C w/o coma, chronic ([place])   . History of chronic bronchitis   . History of drug abuse states quit herion in 2006  . Hypertension   . Methadone maintenance therapy patient ([place])   . Nocturia   . Tendonitis   . Urge urinary incontinence     Past Surgical History:  Procedure Laterality Date  . APPENDECTOMY  1972  . INTERSTIM IMPLANT PLACEMENT  10/23/2011   Procedure: Barrie Lyme IMPLANT  FIRST STAGE;  Surgeon: Reece Packer, MD;  Location: [facility];  Service: Urology;  Laterality: N/A;  . INTERSTIM IMPLANT PLACEMENT  10/23/2011   Procedure: Barrie Lyme IMPLANT SECOND STAGE;  Surgeon: Reece Packer, MD;  Location: [facility];  Service: Urology;  Laterality: N/A;     Current Outpatient Medications  Medication Sig Dispense Refill  . albuterol (PROVENTIL HFA;VENTOLIN HFA) 108 (90 Base) MCG/ACT inhaler Inhale 2 puffs into the lungs every 6 (six) hours as needed. For wheezing 1 Inhaler 2  . aspirin EC 81 MG tablet Take 1 tablet (81 mg total) by mouth daily.    [name] atenolol (TENORMIN) 50 MG tablet Take 0.5 tablets (25 mg total) by mouth daily. 45 tablet 1  . atorvastatin (LIPITOR) 40 MG tablet Take 1 tablet (40 mg total) by mouth daily. 90 tablet 3  . cetirizine (ZYRTEC) 10 MG tablet Take 1 tablet (10 mg total) by mouth daily. 90 tablet 3  . DULoxetine (CYMBALTA) 60 MG capsule Take 1 capsule (60 mg total) by mouth daily. 30 capsule 2  . fluticasone (FLONASE) 50 MCG/ACT nasal spray Place 2 sprays into both nostrils daily. 16 g 2  . glucose blood (ACCU-CHEK AVIVA PLUS) test strip Check blood sugar 3 times a day with accu chek aviva plus strips 100 each 5  . hydrochlorothiazide (HYDRODIURIL) 25 MG tablet Take 1 tablet (25 mg total) by  mouth daily. 90 tablet 1  . Lancet Devices (ACCU-CHEK SOFTCLIX) lancets Check blood sugar 3 times a day. Patient is insulin requiring, ICD 10 code E11.9. 100 each 12  . Lancets 30G MISC Check blood sugar 3 times a day 100 each 5  . metFORMIN (GLUCOPHAGE) 1000 MG tablet Take 1 tablet (1,000 mg total) by mouth 2 (two) times daily. 180 tablet 1  . methadone (DOLOPHINE) 10 MG/5ML solution Take 100 mg by mouth daily.    . Multiple Vitamins-Minerals (ONE-A-DAY WOMENS 50+ ADVANTAGE) TABS Take by mouth daily.    . naproxen sodium (ALEVE) 220 MG tablet Take 220 mg by mouth as needed.    . nicotine (NICODERM CQ - DOSED IN MG/24  HR) 7 mg/24hr patch Medicaid [id number] L Wks 7-8: 7 mg x 1 patch dailyWear for 24 hours. If you have sleep disturbances, remove at bedtime.. 14 patch 0  . tiZANidine (ZANAFLEX) 4 MG tablet TAKE 1 TABLET(4 MG) BY MOUTH EVERY 8 HOURS AS NEEDED FOR MUSCLE SPASMS 270 tablet 1  . nicotine (NICODERM CQ - DOSED IN MG/24 HOURS) 14 mg/24hr patch Medicaid [id number] L Wks 1-6: 14 mg x 1 patch daily. Wear for 24 hours. If you have sleep disturbances, remove at bedtime. (Patient not taking: Reported on 07/07/2017) 42 patch 0  . nicotine polacrilex (NICORETTE) 2 MG gum Medicaid [id number] L. Dispense quantity sufficient, see below for instructions (Patient not taking: Reported on 07/07/2017) 1008 each 0   No current facility-administered medications for this visit.     Allergies:   Lisinopril; Gabapentin; Hydrocodone-acetaminophen; Hydrocodone-acetaminophen; and Vicodin [hydrocodone-acetaminophen]    Social History:  The patient  reports that she has been smoking cigarettes.  She has a 5.00 pack-year smoking history. She has never used smokeless tobacco. She reports that she does not drink alcohol or use drugs.   Family History:  The patient's family history includes CAD in her father; Diabetes type II in her brother, mother, and sister; Heart attack (age of onset: 65) in her brother; Heart attack (age of onset: 45) in her father and mother; Hypertension in her brother and mother.    ROS:  Please see the history of present illness.   Otherwise, review of systems are positive for none.   All other systems are reviewed and negative.    PHYSICAL EXAM: VS:  BP 140/72 (BP Location: Left Arm, Patient Position: Sitting, Cuff Size: Normal)   Pulse 71   Ht 5\' 3"  (1.6 m)   Wt 158 lb 9.6 oz (71.9 kg)   LMP 07/31/2011   BMI 28.09 kg/m  , BMI Body mass index is 28.09 kg/m. GEN: Well nourished, well developed, in no acute distress  HEENT: normal  Neck: no JVD, carotid bruits, or masses Cardiac: RRR; no murmurs, rubs, or  gallops,no edema  Respiratory:  clear to auscultation bilaterally, normal work of breathing GI: soft, nontender, nondistended, + BS MS: no deformity or atrophy  Skin: warm and dry, no rash Neuro:  Strength and sensation are intact Psych: euthymic mood, full affect Vascular: Femoral pulses are normal bilaterally.   posterior tibial is not palpable bilaterally.  Dorsalis pedis is faint on both sides.   EKG:  EKG is not ordered today.    Recent Labs: 01/29/2017: Hemoglobin 10.7; Platelets 180 03/05/2017: BUN 18; Creatinine, Ser 1.54; Potassium 4.0; Sodium 141    Lipid Panel    Component Value Date/Time   CHOL 161 12/12/2015 1122   TRIG 159 (H) 12/12/2015 1122   HDL 35 (L) 12/12/2015 1122  CHOLHDL 4.6 (H) 12/12/2015 1122   LDLCALC 94 12/12/2015 1122      Wt Readings from Last 3 Encounters:  07/07/17 158 lb 9.6 oz (71.9 kg)  06/02/17 155 lb 4.8 oz (70.4 kg)  04/22/17 159 lb 12.8 oz (72.5 kg)        PAD Screen 12/19/2016  Previous PAD dx? No  Previous surgical procedure? No  Pain with walking? No  Feet/toe relief with dangling? Yes  Painful, non-healing ulcers? No  Extremities discolored? No      ASSESSMENT AND PLAN:  1.  Moderate bilateral calf claudication likely due to underlying peripheral arterial disease in the femoral popliteal segment based on her physical exam.  Her symptoms do not seem to be lifestyle limiting.  I discussed with her the natural history and management of claudication.  I talked with her about the importance of controlling her risk factors and the extreme importance of smoking cessation in order to prevent progression to critical limb ischemia. I am going to obtain a  lower extremity arterial Doppler and likely she will require duplex. Based on the results, I will likely refer her to a structured exercise program via cardiac rehab. Angiography and endovascular intervention can be considered for refractory symptoms.  2.  Tobacco use: I  discussed with her the importance of smoking cessation.  3.  Hyperlipidemia: Continue treatment with atorvastatin with a target LDL of less than 70.  4.  Essential hypertension: Blood pressure is reasonably controlled on current medications.    Disposition:   FU with me in 3 months  Signed,  [name], MD  07/08/2017 1:52 PM    [facility]

## 2017-07-16 ENCOUNTER — Ambulatory Visit (INDEPENDENT_AMBULATORY_CARE_PROVIDER_SITE_OTHER): Payer: Medicare Other | Admitting: Internal Medicine

## 2017-07-16 ENCOUNTER — Other Ambulatory Visit: Payer: Self-pay | Admitting: Cardiovascular Disease

## 2017-07-16 ENCOUNTER — Encounter: Payer: Self-pay | Admitting: Internal Medicine

## 2017-07-16 ENCOUNTER — Other Ambulatory Visit: Payer: Self-pay

## 2017-07-16 ENCOUNTER — Other Ambulatory Visit: Payer: Self-pay | Admitting: Internal Medicine

## 2017-07-16 VITALS — BP 131/63 | HR 75 | Temp 98.0°F | Ht 63.0 in | Wt 156.9 lb

## 2017-07-16 DIAGNOSIS — B977 Papillomavirus as the cause of diseases classified elsewhere: Secondary | ICD-10-CM

## 2017-07-16 DIAGNOSIS — R6889 Other general symptoms and signs: Secondary | ICD-10-CM

## 2017-07-16 DIAGNOSIS — G8929 Other chronic pain: Secondary | ICD-10-CM

## 2017-07-16 DIAGNOSIS — F17210 Nicotine dependence, cigarettes, uncomplicated: Secondary | ICD-10-CM

## 2017-07-16 DIAGNOSIS — M549 Dorsalgia, unspecified: Secondary | ICD-10-CM | POA: Diagnosis not present

## 2017-07-16 DIAGNOSIS — E088 Diabetes mellitus due to underlying condition with unspecified complications: Secondary | ICD-10-CM | POA: Diagnosis not present

## 2017-07-16 DIAGNOSIS — D170 Benign lipomatous neoplasm of skin and subcutaneous tissue of head, face and neck: Secondary | ICD-10-CM

## 2017-07-16 DIAGNOSIS — F17200 Nicotine dependence, unspecified, uncomplicated: Secondary | ICD-10-CM

## 2017-07-16 DIAGNOSIS — Z79899 Other long term (current) drug therapy: Secondary | ICD-10-CM | POA: Diagnosis not present

## 2017-07-16 DIAGNOSIS — I739 Peripheral vascular disease, unspecified: Secondary | ICD-10-CM

## 2017-07-16 DIAGNOSIS — Z7982 Long term (current) use of aspirin: Secondary | ICD-10-CM

## 2017-07-16 DIAGNOSIS — E1151 Type 2 diabetes mellitus with diabetic peripheral angiopathy without gangrene: Secondary | ICD-10-CM

## 2017-07-16 DIAGNOSIS — I10 Essential (primary) hypertension: Secondary | ICD-10-CM

## 2017-07-16 DIAGNOSIS — Z794 Long term (current) use of insulin: Secondary | ICD-10-CM

## 2017-07-16 HISTORY — DX: Benign lipomatous neoplasm of skin and subcutaneous tissue of head, face and neck: D17.0

## 2017-07-16 LAB — GLUCOSE, CAPILLARY: Glucose-Capillary: 160 mg/dL — ABNORMAL HIGH (ref 65–99)

## 2017-07-16 LAB — POCT GLYCOSYLATED HEMOGLOBIN (HGB A1C): Hemoglobin A1C: 5.5

## 2017-07-16 MED ORDER — HYDROCHLOROTHIAZIDE 25 MG PO TABS: 25.0000 mg | ORAL_TABLET | Freq: Every day | ORAL | 1 refills | Status: DC

## 2017-07-16 MED ORDER — NICOTINE 7 MG/24HR TD PT24: MEDICATED_PATCH | TRANSDERMAL | 0 refills | Status: DC

## 2017-07-16 MED ORDER — TIZANIDINE HCL 4 MG PO TABS: ORAL_TABLET | ORAL | 0 refills | Status: DC

## 2017-07-16 MED ORDER — NICOTINE 14 MG/24HR TD PT24: MEDICATED_PATCH | TRANSDERMAL | 0 refills | Status: DC

## 2017-07-16 NOTE — Progress Notes (Signed)
CC: Follow up DM, claudication symptoms, and abnormal Pap smear  HPI:  Rose [name] is a 62 y.o. woman with a past medical history listed below here today for follow up of her DM, claudication symptoms, and abnormal Pap smear.  For details of today's visit and the status of her chronic medical issues please refer to the assessment and plan.   Past Medical History:  Diagnosis Date  . Anxiety   . Arthritis   . Asthma   . Chronic back pain   . Depression   . Diabetes mellitus   . Edema of lower extremity history cellulitis jan 2013  . Frequency of urination   . Hep C w/o coma, chronic ([place])   . History of chronic bronchitis   . History of drug abuse states quit herion in 2006  . Hypertension   . Methadone maintenance therapy patient ([place])   . Nocturia   . Tendonitis   . Urge urinary incontinence    Review of Systems:  Please see pertinent ROS reviewed in HPI and problem based charting.   Physical Exam:  Vitals:   07/16/17 1314  BP: 131/63  Pulse: 75  Temp: 98 F (36.7 C)  TempSrc: Oral  SpO2: 100%  Weight: 156 lb 14.4 oz (71.2 kg)  Height: 5\' 3"  (1.6 m)   Physical Exam  Constitutional: She is oriented to person, place, and time and well-developed, well-nourished, and in no distress.  HENT:  Head: Normocephalic and atraumatic.  Eyes: Conjunctivae and EOM are normal.  Neck:  She has a small mobile mass on her posterior neck that is consistent with either lipoma or cyst.  Cardiovascular: Normal rate and regular rhythm.  Pulmonary/Chest: Effort normal and breath sounds normal.  Neurological: She is alert and oriented to person, place, and time.  Skin: Skin is warm and dry.  Psychiatric: Mood and affect normal.     Assessment & Plan:   See Encounters Tab for problem based charting.  Patient discussed with Dr. Eppie Gibson .  Diabetes mellitus with complication, with long-term current use of insulin (HCC) A1c today is 5.5.  She remains off insulin altogether  and remains on metformin monotherapy at 1000mg  BID.  She has still been checking her blood sugars out of habit which show an average reading of 117.  She is agreeable to a trial off metformin to see how she does.  Plan: - RTC 3 months for repeat A1c - Stop metformin - Urine microalbumin obtained today - Eye exam completed in January 2019 with plan for re-evaluation in 12 months  Chronic back pain She has only been taking Tizanidine daily as needed but did not realize the prescription was written for every 8 hours as needed and was worried about needing a refill today.  Plan: - advised that she can take tizanidine every 8 hours as needed as initially prescribed in March - new prescription sent for 90 tablet today  Tobacco use disorder She is currently smoking about 4-5 cigarettes per day.  She was encouraged to stop smoking to help with her peripheral vascular disease and overall health. She has found nicotine patches to be beneficial in the past.  Plan: - Refilled her patches.  14mg  daily for weeks 1-6 followed by 7mg  daily for weeks 7-8  Abnormal ankle brachial index (ABI) She saw Dr Fletcher Anon recently who plans to obtain a  lower extremity arterial doppler and likely duplex next week.  She has been trying to exercise more and is now up  to 15 minutes of walking at a time.    Plan: - Smoking cessation encouraged - Continue ASA and statin - BP control - DM management as noted under "Diabetes" plan   HPV in female Pap in March 2018 detected high-risk HPV with normal cytology, however, unable to obtain DNA typing for 16 or 18.  It was recommended she have repeat Pap with co-testing in 1 year.  Plan: - I offered her Pap smear today which she declined.  After discussion as to the reasoning why, she asked that she come back next month to do so.  Lipoma of neck Exam is consistent with either lipoma or sebaceous cyst.  It is not painful or overly bothersome.  However, she is interested in  having it removed  Plan: - Referral placed to general surgery

## 2017-07-16 NOTE — Assessment & Plan Note (Signed)
Pap in March 2018 detected high-risk HPV with normal cytology, however, unable to obtain DNA typing for 16 or 18.  It was recommended she have repeat Pap with co-testing in 1 year.  Plan: - I offered her Pap smear today which she declined.  After discussion as to the reasoning why, she asked that she come back next month to do so.

## 2017-07-16 NOTE — Assessment & Plan Note (Signed)
She has only been taking Tizanidine daily as needed but did not realize the prescription was written for every 8 hours as needed and was worried about needing a refill today.  Plan: - advised that she can take tizanidine every 8 hours as needed as initially prescribed in March - new prescription sent for 90 tablet today

## 2017-07-16 NOTE — Assessment & Plan Note (Signed)
A1c today is 5.5.  She remains off insulin altogether and remains on metformin monotherapy at 1000mg  BID.  She has still been checking her blood sugars out of habit which show an average reading of 117.  She is agreeable to a trial off metformin to see how she does.  Plan: - RTC 3 months for repeat A1c - Stop metformin - Urine microalbumin obtained today - Eye exam completed in January 2019 with plan for re-evaluation in 12 months

## 2017-07-16 NOTE — Patient Instructions (Addendum)
Thank you for coming to see me today. It was a pleasure. Today we talked about:   Diabetes: - Your A1c is 5.5 today.   - We will stop your metformin and repeat your A1c in 3 months  Back pain: - Keep exercising as you are doing - You can take the Tizanidine every 8 hours as needed  Please follow-up with [place] in 4-6 weeks for your Pap smear.  Then again in 3 months for diabetes follow up.  If you have any questions or concerns, please do not hesitate to call the office at [phone].  Take Care,   Jule Ser, DO

## 2017-07-16 NOTE — Assessment & Plan Note (Signed)
Exam is consistent with either lipoma or sebaceous cyst.  It is not painful or overly bothersome.  However, she is interested in having it removed  Plan: - Referral placed to general surgery

## 2017-07-16 NOTE — Assessment & Plan Note (Signed)
She is currently smoking about 4-5 cigarettes per day.  She was encouraged to stop smoking to help with her peripheral vascular disease and overall health. She has found nicotine patches to be beneficial in the past.  Plan: - Refilled her patches.  14mg  daily for weeks 1-6 followed by 7mg  daily for weeks 7-8

## 2017-07-16 NOTE — Assessment & Plan Note (Signed)
She saw Dr Fletcher Anon recently who plans to obtain a  lower extremity arterial doppler and likely duplex next week.  She has been trying to exercise more and is now up to 15 minutes of walking at a time.    Plan: - Smoking cessation encouraged - Continue ASA and statin - BP control - DM management as noted under "Diabetes" plan

## 2017-07-17 ENCOUNTER — Other Ambulatory Visit: Payer: Self-pay | Admitting: Internal Medicine

## 2017-07-17 DIAGNOSIS — I10 Essential (primary) hypertension: Secondary | ICD-10-CM

## 2017-07-17 LAB — MICROALBUMIN / CREATININE URINE RATIO
Creatinine, Urine: 40.2 mg/dL
Microalb/Creat Ratio: 20.4 mg/g creat (ref 0.0–30.0)
Microalbumin, Urine: 8.2 ug/mL

## 2017-07-20 ENCOUNTER — Ambulatory Visit (HOSPITAL_COMMUNITY)
Admission: RE | Admit: 2017-07-20 | Discharge: 2017-07-20 | Disposition: A | Discharge status: Home / Self Care | Payer: Medicare Other | Source: Ambulatory Visit | Attending: Cardiology | Admitting: Cardiology

## 2017-07-20 DIAGNOSIS — I739 Peripheral vascular disease, unspecified: Secondary | ICD-10-CM | POA: Diagnosis not present

## 2017-07-20 NOTE — Progress Notes (Signed)
Case discussed with Dr. Wallace at the time of the visit.  We reviewed the resident's history and exam and pertinent patient test results.  I agree with the assessment, diagnosis and plan of care documented in the resident's note. 

## 2017-07-23 ENCOUNTER — Other Ambulatory Visit: Payer: Self-pay

## 2017-07-23 NOTE — Telephone Encounter (Signed)
HCTZ was refilled on 4/18 per Dr Juleen China but "No Print". Rx called to [facility] - pt called / informed.

## 2017-07-23 NOTE — Telephone Encounter (Signed)
hydrochlorothiazide (HYDRODIURIL) 25 MG tablet, refill request @ [facility] on [address].

## 2017-08-03 ENCOUNTER — Encounter (HOSPITAL_COMMUNITY): Payer: Self-pay | Admitting: Emergency Medicine

## 2017-08-03 ENCOUNTER — Emergency Department (HOSPITAL_COMMUNITY)
Admission: EM | Admit: 2017-08-03 | Discharge: 2017-08-03 | Disposition: A | Discharge status: Home / Self Care | Payer: Medicare Other | Attending: Emergency Medicine | Admitting: Emergency Medicine

## 2017-08-03 ENCOUNTER — Other Ambulatory Visit: Payer: Self-pay

## 2017-08-03 ENCOUNTER — Emergency Department (HOSPITAL_COMMUNITY): Payer: Medicare Other

## 2017-08-03 DIAGNOSIS — J45909 Unspecified asthma, uncomplicated: Secondary | ICD-10-CM | POA: Insufficient documentation

## 2017-08-03 DIAGNOSIS — Y999 Unspecified external cause status: Secondary | ICD-10-CM | POA: Insufficient documentation

## 2017-08-03 DIAGNOSIS — X501XXA Overexertion from prolonged static or awkward postures, initial encounter: Secondary | ICD-10-CM | POA: Insufficient documentation

## 2017-08-03 DIAGNOSIS — M7989 Other specified soft tissue disorders: Secondary | ICD-10-CM | POA: Diagnosis not present

## 2017-08-03 DIAGNOSIS — F17210 Nicotine dependence, cigarettes, uncomplicated: Secondary | ICD-10-CM | POA: Diagnosis not present

## 2017-08-03 DIAGNOSIS — Y939 Activity, unspecified: Secondary | ICD-10-CM | POA: Insufficient documentation

## 2017-08-03 DIAGNOSIS — E119 Type 2 diabetes mellitus without complications: Secondary | ICD-10-CM | POA: Diagnosis not present

## 2017-08-03 DIAGNOSIS — S93401A Sprain of unspecified ligament of right ankle, initial encounter: Secondary | ICD-10-CM | POA: Diagnosis not present

## 2017-08-03 DIAGNOSIS — I5030 Unspecified diastolic (congestive) heart failure: Secondary | ICD-10-CM | POA: Diagnosis not present

## 2017-08-03 DIAGNOSIS — S99911A Unspecified injury of right ankle, initial encounter: Secondary | ICD-10-CM | POA: Diagnosis not present

## 2017-08-03 DIAGNOSIS — I110 Hypertensive heart disease with heart failure: Secondary | ICD-10-CM | POA: Diagnosis not present

## 2017-08-03 DIAGNOSIS — Y929 Unspecified place or not applicable: Secondary | ICD-10-CM | POA: Insufficient documentation

## 2017-08-03 DIAGNOSIS — Z7982 Long term (current) use of aspirin: Secondary | ICD-10-CM | POA: Insufficient documentation

## 2017-08-03 DIAGNOSIS — Z79899 Other long term (current) drug therapy: Secondary | ICD-10-CM | POA: Diagnosis not present

## 2017-08-03 NOTE — Discharge Instructions (Addendum)
Please read attached information. If you experience any new or worsening signs or symptoms please return to the emergency room for evaluation. Please follow-up with your primary care provider or specialist as discussed.  °

## 2017-08-03 NOTE — ED Provider Notes (Signed)
[facility] EMERGENCY DEPARTMENT Provider Note   CSN: [id number] Arrival date & time: 08/03/17  1200     History   Chief Complaint Chief Complaint  Patient presents with  . Ankle Pain    HPI Rose [name] is a 62 y.o. female.  HPI   62 year old female presents today with complaints of right ankle pain.  Patient notes she tripped on a rug yesterday causing her to fall and twisting her right ankle.  She notes some swelling and bruising to the ankle.  Pain is worse on the medial aspect.  3 other than the ankle, notes that she took Aleve with no significant relief of her symptoms.  Patient notes she has a cane at home and has been able to ambulate with the use of this.    Past Medical History:  Diagnosis Date  . Anxiety   . Arthritis   . Asthma   . Chronic back pain   . Depression   . Diabetes mellitus   . Edema of lower extremity history cellulitis jan 2013  . Frequency of urination   . Hep C w/o coma, chronic ([place])   . History of chronic bronchitis   . History of drug abuse states quit herion in 2006  . Hypertension   . Methadone maintenance therapy patient ([place])   . Nocturia   . Tendonitis   . Urge urinary incontinence     Patient Active Problem List   Diagnosis Date Noted  . Lipoma of neck 07/16/2017  . Dry skin 06/02/2017  . Abnormal ankle brachial index (ABI) 06/02/2017  . Diabetic neuropathy ([place]) 06/02/2017  . Shortness of breath 01/23/2017  . Diastolic heart failure ([place]) 12/20/2016  . Atypical chest pain 10/30/2016  . Pain in both feet 08/22/2016  . HPV in female 06/11/2016  . Recurrent falls 04/30/2016  . Lymphocytosis 04/29/2016  . ACE inhibitor-aggravated angioedema, initial encounter 04/26/2016  . Environmental allergies 12/28/2015  . Hyperlipidemia associated with type 2 diabetes mellitus ([place]) 12/13/2015  . Tobacco use disorder 12/13/2015  . Chronic renal insufficiency 12/12/2015  . Depression 12/12/2015  . Methadone  maintenance therapy patient ([place])   . History of drug abuse   . Chronic back pain   . Falls 05/31/2015  . Bereavement due to life event 01/25/2015  . Preventative health care 12/14/2014  . HCV (hepatitis C virus) 08/26/2013  . Arthritis, degenerative 08/26/2013  . Diabetes mellitus with complication, with long-term current use of insulin ([place]) 02/27/2012  . Essential hypertension 02/27/2012    Past Surgical History:  Procedure Laterality Date  . APPENDECTOMY  1972  . INTERSTIM IMPLANT PLACEMENT  10/23/2011   Procedure: Barrie Lyme IMPLANT FIRST STAGE;  Surgeon: Reece Packer, MD;  Location: [facility];  Service: Urology;  Laterality: N/A;  . INTERSTIM IMPLANT PLACEMENT  10/23/2011   Procedure: Barrie Lyme IMPLANT SECOND STAGE;  Surgeon: Reece Packer, MD;  Location: [facility];  Service: Urology;  Laterality: N/A;     OB History   None      Home Medications    Prior to Admission medications   Medication Sig Start Date End Date Taking? Authorizing Provider  albuterol (PROVENTIL HFA;VENTOLIN HFA) 108 (90 Base) MCG/ACT inhaler Inhale 2 puffs into the lungs every 6 (six) hours as needed. For wheezing 04/19/15   Jule Ser, DO  aspirin EC 81 MG tablet Take 1 tablet (81 mg total) by mouth daily. 12/19/16   End, Harrell Gave, MD  atenolol (TENORMIN) 50 MG  tablet Take 0.5 tablets (25 mg total) by mouth daily. 01/21/17   Jule Ser, DO  atorvastatin (LIPITOR) 40 MG tablet Take 1 tablet (40 mg total) by mouth daily. 06/02/17 06/02/18  Jule Ser, DO  cetirizine (ZYRTEC) 10 MG tablet Take 1 tablet (10 mg total) by mouth daily. 06/02/17   Jule Ser, DO  DULoxetine (CYMBALTA) 60 MG capsule Take 1 capsule (60 mg total) by mouth daily. 06/02/17   Jule Ser, DO  fluticasone (FLONASE) 50 MCG/ACT nasal spray Place 2 sprays into both nostrils daily. 03/05/17 03/05/18  Jule Ser, DO  glucose blood (ACCU-CHEK AVIVA PLUS) test strip Check blood  sugar 3 times a day with accu chek aviva plus strips 02/17/17   Jule Ser, DO  hydrochlorothiazide (HYDRODIURIL) 25 MG tablet Take 1 tablet (25 mg total) by mouth daily. 07/16/17   Jule Ser, DO  Lancet Devices [facility]) lancets Check blood sugar 3 times a day. Patient is insulin requiring, ICD 10 code E11.9. 04/15/16   Jule Ser, DO  Lancets 30G MISC Check blood sugar 3 times a day 12/14/15   Jule Ser, DO  methadone (DOLOPHINE) 10 MG/5ML solution Take 100 mg by mouth daily.    [provider]  Multiple Vitamins-Minerals (ONE-A-DAY WOMENS 50+ ADVANTAGE) TABS Take by mouth daily.    [provider]  naproxen sodium (ALEVE) 220 MG tablet Take 220 mg by mouth as needed.    [provider]  nicotine (NICODERM CQ - DOSED IN MG/24 HOURS) 14 mg/24hr patch Medicaid [id number] L Wks 1-6: 14 mg x 1 patch daily. Wear for 24 hours. If you have sleep disturbances, remove at bedtime. 07/16/17   Jule Ser, DO  nicotine (NICODERM CQ - DOSED IN MG/24 HR) 7 mg/24hr patch Medicaid [id number] L Wks 7-8: 7 mg x 1 patch dailyWear for 24 hours. If you have sleep disturbances, remove at bedtime.. 07/16/17   Jule Ser, DO  tiZANidine (ZANAFLEX) 4 MG tablet TAKE 1 TABLET(4 MG) BY MOUTH EVERY 8 HOURS AS NEEDED FOR MUSCLE SPASMS 07/16/17   Jule Ser, DO    Family History Family History  Problem Relation Age of Onset  . Hypertension Mother   . Diabetes type II Mother   . Heart attack Mother 32  . CAD Father   . Heart attack Father 81  . Hypertension Brother   . Diabetes type II Brother   . Heart attack Brother 80  . Diabetes type II Sister     Social History Social History   Tobacco Use  . Smoking status: Current Every Day Smoker    Packs/day: 0.25    Years: 20.00    Pack years: 5.00    Types: Cigarettes  . Smokeless tobacco: Never Used  . Tobacco comment: 3 cigs per day  Substance Use Topics  . Alcohol use: No    Alcohol/week: 0.0 oz    . Drug use: No    Comment: methadone program  PER PT QUIT HEROIN 2006  ALSO HX MARIJUANA USE     Allergies   Lisinopril; Gabapentin; Hydrocodone-acetaminophen; Hydrocodone-acetaminophen; and Vicodin [hydrocodone-acetaminophen]   Review of Systems Review of Systems  All other systems reviewed and are negative.    Physical Exam Updated Vital Signs BP (!) 148/91 (BP Location: Right Arm)   Pulse 76   Temp 98.1 F (36.7 C) (Oral)   Resp 20   Ht 5\' 3"  (1.6 m)   Wt 70.8 kg (156 lb)   LMP 07/31/2011   SpO2 100%   BMI 27.63  kg/m   Physical Exam  Constitutional: She is oriented to person, place, and time. She appears well-developed and well-nourished.  HENT:  Head: Normocephalic and atraumatic.  Eyes: Pupils are equal, round, and reactive to light. Conjunctivae are normal. Right eye exhibits no discharge. Left eye exhibits no discharge. No scleral icterus.  Neck: Normal range of motion. No JVD present. No tracheal deviation present.  Pulmonary/Chest: Effort normal. No stridor.  Musculoskeletal:  Soft tissue swelling noted about the ankle, tenderness palpation of the medial malleolus and proximal foot, remainder of foot nontender with no swelling or edema, reduced range of motion secondary to pain, no significant laxity of the ankle sensation intact, no open wounds  Neurological: She is alert and oriented to person, place, and time. Coordination normal.  Psychiatric: She has a normal mood and affect. Her behavior is normal. Judgment and thought content normal.  Nursing note and vitals reviewed.    ED Treatments / Results  Labs (all labs ordered are listed, but only abnormal results are displayed) Labs Reviewed - No data to display  EKG None  Radiology Dg Ankle Complete Right  Result Date: 08/03/2017 CLINICAL DATA:  Tripped and fell on a rug last night injuring the right ankle. Symptoms are un relieved with Aleve. The patient can bear weight using a cane. EXAM: RIGHT ANKLE  - COMPLETE 3+ VIEW COMPARISON:  None in PACs FINDINGS: There is diffuse soft tissue swelling. The bones are subjectively adequately mineralized. There is no acute malleolar fracture. The talar dome and remainder of the talus are normal. The joint mortise is preserved. The calcaneus is intact. The metatarsal bases are normal. IMPRESSION: No acute bony abnormality of the right ankle. There is a moderate amount of diffuse soft tissue swelling. Electronically Signed   By: David  Martinique M.D.   On: 08/03/2017 13:15    Procedures Procedures (including critical care time)  Medications Ordered in ED Medications - No data to display   Initial Impression / Assessment and Plan / ED Course  I have reviewed the triage vital signs and the nursing notes.  Pertinent labs & imaging results that were available during my care of the patient were reviewed by me and considered in my medical decision making (see chart for details).     62 year old female presents today with likely low-grade ankle sprain.  No acute fractures noted, patient placed in ASO, she has a cane and will use this as needed.  She is encouraged follow-up with primary care if symptoms persist return to the emergency room if they worsen.  She verbalized understanding and agreement to today's plan had no further questions or concerns.  Final Clinical Impressions(s) / ED Diagnoses   Final diagnoses:  Sprain of right ankle, unspecified ligament, initial encounter    ED Discharge Orders    None       Francee Gentile 08/03/17 2149    Varney Biles, MD 08/05/17 [phone]

## 2017-08-03 NOTE — ED Triage Notes (Signed)
Pt states she tripped on a rug last night and sprang her right ankle, pain 7/10. Took aleve with no relief. Pt was able to walk on it with her cane.

## 2017-08-04 ENCOUNTER — Other Ambulatory Visit: Payer: Self-pay

## 2017-08-07 ENCOUNTER — Other Ambulatory Visit: Payer: Self-pay

## 2017-08-07 NOTE — Patient Outreach (Addendum)
[facility]) Care Management  08/07/2017  Rose [name] 1955/05/25 [id number]  Referral per [facility] ED census received 08/04/17. Referred regarding transportation to and from doctor's appointment.   62 year old with history of HTN, DM, falls  Screening completed. Client denies any needs at this time. She reports she has transportation needs taken care of. RNCM emphasized that if her needs change to call. Also emphasized the 24 hour nurse advice line.   [name] reports her daughter is a Marine scientist at [employer] and states if her daughter cannot take her, then one of her grandchildren will. [name] states, "I've gotten it taken care of now".  Client has a history of falls. She states she has gotten rid of all the throw rugs now, she has a light in the bathroom and she is aware to wear skid proof socks. Client states she uses a Radio producer. RNCM encouraged client to use assistive device as recommended.  RNCM discussed care management program. Client denies any needs at this time.   Plan close case.  Thea Silversmith, RN, MSN, [place] Coordinator Cell: [phone]

## 2017-08-13 DIAGNOSIS — D367 Benign neoplasm of other specified sites: Secondary | ICD-10-CM | POA: Diagnosis not present

## 2017-08-14 ENCOUNTER — Other Ambulatory Visit: Payer: Self-pay

## 2017-08-28 ENCOUNTER — Ambulatory Visit: Payer: Medicare Other | Admitting: Podiatry

## 2017-09-18 DIAGNOSIS — Z79891 Long term (current) use of opiate analgesic: Secondary | ICD-10-CM | POA: Diagnosis not present

## 2017-09-26 ENCOUNTER — Encounter: Payer: Self-pay | Admitting: *Deleted

## 2017-09-28 ENCOUNTER — Other Ambulatory Visit: Payer: Self-pay | Admitting: Internal Medicine

## 2017-09-28 DIAGNOSIS — G8929 Other chronic pain: Secondary | ICD-10-CM

## 2017-09-28 DIAGNOSIS — M545 Low back pain, unspecified: Secondary | ICD-10-CM

## 2017-09-28 NOTE — Telephone Encounter (Signed)
Refilled medications, pt will need follow up with her PCP within the next 3 months

## 2017-10-06 ENCOUNTER — Ambulatory Visit (INDEPENDENT_AMBULATORY_CARE_PROVIDER_SITE_OTHER): Payer: Medicare Other | Admitting: Cardiovascular Disease

## 2017-10-06 ENCOUNTER — Encounter: Payer: Self-pay | Admitting: Cardiovascular Disease

## 2017-10-06 ENCOUNTER — Ambulatory Visit: Payer: Medicare Other | Admitting: Cardiovascular Disease

## 2017-10-06 ENCOUNTER — Other Ambulatory Visit: Payer: Self-pay

## 2017-10-06 VITALS — BP 138/72 | HR 67 | Ht 63.0 in | Wt 163.0 lb

## 2017-10-06 DIAGNOSIS — I10 Essential (primary) hypertension: Secondary | ICD-10-CM

## 2017-10-06 DIAGNOSIS — E785 Hyperlipidemia, unspecified: Secondary | ICD-10-CM

## 2017-10-06 DIAGNOSIS — I739 Peripheral vascular disease, unspecified: Secondary | ICD-10-CM

## 2017-10-06 DIAGNOSIS — Z720 Tobacco use: Secondary | ICD-10-CM

## 2017-10-06 NOTE — Patient Instructions (Signed)
Medication Instructions:  Your physician recommends that you continue on your current medications as directed. Please refer to the Current Medication list given to you today.   Labwork: none  Testing/Procedures: none  Follow-Up: Your physician wants you to follow-up in: 12 months with Dr. Arida. You will receive a reminder letter in the mail two months in advance. If you don't receive a letter, please call our office to schedule the follow-up appointment.   Any Other Special Instructions Will Be Listed Below (If Applicable).     If you need a refill on your cardiac medications before your next appointment, please call your pharmacy.  

## 2017-10-06 NOTE — Progress Notes (Signed)
Cardiology Office Note   Date:  10/06/2017   ID:  Rose, [name] Jan 06, 1956, MRN [id number]  PCP:  Isabelle Course, MD  Cardiologist:   [name], MD   No chief complaint on file.     History of Present Illness: Rose [name] is a 62 y.o. female who is here today for follow-up visit regarding peripheral arterial disease.  The patient has multiple chronic medical conditions that include type 2 diabetes, essential hypertension, hyperlipidemia and tobacco use.  She was seen last year by Dr. Saunders Revel for evaluation of exertional dyspnea and atypical chest pain.  She underwent cardiac workup that included a pharmacologic nuclear stress test which was negative for ischemia.  Echocardiogram was also unremarkable with normal ejection fraction and grade 1 diastolic dysfunction. The patient complained of some bilateral leg pain recently and thus underwent a screening ABI  which was abnormal.  She was thus referred.  She has very atypical leg claudication and she seems to be mostly limited by peripheral neuropathy more than true claudication. She underwent vascular studies in our office in April which showed normal ABI bilaterally.  Duplex showed mild right SFA disease and moderate left popliteal artery stenosis.  Past Medical History:  Diagnosis Date  . Anxiety   . Arthritis   . Asthma   . Chronic back pain   . Depression   . Diabetes mellitus   . Edema of lower extremity history cellulitis jan 2013  . Frequency of urination   . Hep C w/o coma, chronic ([place])   . History of chronic bronchitis   . History of drug abuse states quit herion in 2006  . Hypertension   . Methadone maintenance therapy patient ([place])   . Nocturia   . Tendonitis   . Urge urinary incontinence     Past Surgical History:  Procedure Laterality Date  . APPENDECTOMY  1972  . INTERSTIM IMPLANT PLACEMENT  10/23/2011   Procedure: Barrie Lyme IMPLANT FIRST STAGE;  Surgeon: Reece Packer, MD;  Location: [facility];  Service: Urology;  Laterality: N/A;  . INTERSTIM IMPLANT PLACEMENT  10/23/2011   Procedure: Barrie Lyme IMPLANT SECOND STAGE;  Surgeon: Reece Packer, MD;  Location: [facility];  Service: Urology;  Laterality: N/A;     Current Outpatient Medications  Medication Sig Dispense Refill  . albuterol (PROVENTIL HFA;VENTOLIN HFA) 108 (90 Base) MCG/ACT inhaler Inhale 2 puffs into the lungs every 6 (six) hours as needed. For wheezing 1 Inhaler 2  . aspirin EC 81 MG tablet Take 1 tablet (81 mg total) by mouth daily.    [name] atenolol (TENORMIN) 50 MG tablet TAKE 1/2 TABLET(25 MG) BY MOUTH DAILY 45 tablet 0  . atorvastatin (LIPITOR) 40 MG tablet Take 1 tablet (40 mg total) by mouth daily. 90 tablet 3  . cetirizine (ZYRTEC) 10 MG tablet Take 1 tablet (10 mg total) by mouth daily. 90 tablet 3  . DULoxetine (CYMBALTA) 60 MG capsule TAKE 1 CAPSULE(60 MG) BY MOUTH DAILY 90 capsule 0  . glucose blood (ACCU-CHEK AVIVA PLUS) test strip Check blood sugar 3 times a day with accu chek aviva plus strips 100 each 5  . hydrochlorothiazide (HYDRODIURIL) 25 MG tablet Take 1 tablet (25 mg total) by mouth daily. 90 tablet 1  . Lancet Devices (ACCU-CHEK SOFTCLIX) lancets Check blood sugar 3 times a day. Patient is insulin requiring, ICD 10 code E11.9. 100 each 12  . Lancets 30G MISC Check blood sugar 3 times a  day 100 each 5  . methadone (DOLOPHINE) 10 MG/5ML solution Take 100 mg by mouth daily.    . Multiple Vitamins-Minerals (ONE-A-DAY WOMENS 50+ ADVANTAGE) TABS Take by mouth daily.    . naproxen sodium (ALEVE) 220 MG tablet Take 220 mg by mouth as needed.    . nicotine (NICODERM CQ - DOSED IN MG/24 HOURS) 14 mg/24hr patch Medicaid [id number] L Wks 1-6: 14 mg x 1 patch daily. Wear for 24 hours. If you have sleep disturbances, remove at bedtime. 42 patch 0  . tiZANidine (ZANAFLEX) 4 MG tablet TAKE 1 TABLET(4 MG) BY MOUTH EVERY 8 HOURS AS NEEDED FOR MUSCLE SPASMS 90 tablet 0   No current  facility-administered medications for this visit.     Allergies:   Lisinopril; Gabapentin; Hydrocodone-acetaminophen; Hydrocodone-acetaminophen; and Vicodin [hydrocodone-acetaminophen]    Social History:  The patient  reports that she has been smoking cigarettes.  She has a 5.00 pack-year smoking history. She has never used smokeless tobacco. She reports that she does not drink alcohol or use drugs.   Family History:  The patient's family history includes CAD in her father; Diabetes type II in her brother, mother, and sister; Heart attack (age of onset: 94) in her brother; Heart attack (age of onset: 45) in her father and mother; Hypertension in her brother and mother.    ROS:  Please see the history of present illness.   Otherwise, review of systems are positive for none.   All other systems are reviewed and negative.    PHYSICAL EXAM: VS:  BP 138/72   Pulse 67   Ht 5\' 3"  (1.6 m)   Wt 163 lb (73.9 kg)   LMP 07/31/2011   BMI 28.87 kg/m   , BMI Body mass index is 28.87 kg/m. GEN: Well nourished, well developed, in no acute distress  HEENT: normal  Neck: no JVD, carotid bruits, or masses Cardiac: RRR; no murmurs, rubs, or gallops,no edema  Respiratory:  clear to auscultation bilaterally, normal work of breathing GI: soft, nontender, nondistended, + BS MS: no deformity or atrophy  Skin: warm and dry, no rash Neuro:  Strength and sensation are intact Psych: euthymic mood, full affect Vascular: Femoral pulses are normal bilaterally.   posterior tibial is not palpable bilaterally.  Dorsalis pedis is faint on both sides.   EKG:  EKG  ordered today. EKG showed normal sinus rhythm with possible left atrial enlargement   Recent Labs: 01/29/2017: Hemoglobin 10.7; Platelets 180 03/05/2017: BUN 18; Creatinine, Ser 1.54; Potassium 4.0; Sodium 141    Lipid Panel    Component Value Date/Time   CHOL 161 12/12/2015 1122   TRIG 159 (H) 12/12/2015 1122   HDL 35 (L) 12/12/2015 1122    CHOLHDL 4.6 (H) 12/12/2015 1122   LDLCALC 94 12/12/2015 1122      Wt Readings from Last 3 Encounters:  10/06/17 163 lb (73.9 kg)  08/03/17 156 lb (70.8 kg)  07/16/17 156 lb 14.4 oz (71.2 kg)        PAD Screen 12/19/2016  Previous PAD dx? No  Previous surgical procedure? No  Pain with walking? No  Feet/toe relief with dangling? Yes  Painful, non-healing ulcers? No  Extremities discolored? No      ASSESSMENT AND PLAN:  1.  Atypical leg claudication: The patient had a normal ABI and duplex showed overall mild to moderate nonobstructive disease.  I discussed with her the natural history and management of peripheral arterial disease and stressed the importance of controlling her risk factors  especially diabetes and smoking cessation.   there is currently no indication for revascularization and she seems to be mostly limited by peripheral neuropathy.  2.  Tobacco use: I discussed with her the importance of smoking cessation.  3.  Hyperlipidemia: Continue treatment with atorvastatin with a target LDL of less than 70.  4.  Essential hypertension: Blood pressure is reasonably controlled on current medications.    Disposition:   FU with me in 12 months  Signed,  [name], MD  10/06/2017 9:54 AM    [place]

## 2017-10-07 ENCOUNTER — Other Ambulatory Visit: Payer: Self-pay

## 2017-10-07 NOTE — Patient Outreach (Signed)
[place] [facility]) Care Management  10/07/2017  Rose [name] [birth year] [id number]   Telephone Screen  Referral Date: 10/07/17 Referral Source: EMMI Prevent Referral Reason: "patient inquired about info in regards to dentures and has a high risk for falling" Insurance: [employer] Medicare   Outreach attempt # 1 to patient. Spoke with patient. She voices that she is headed to a MD appt and requested call back another time.       Plan: RN CM will make outreach attempt to patient within 3-4 business days.    Enzo Montgomery, RN,BSN,CCM [place] Management Telephonic Care Management Coordinator Direct Phone: [phone] Toll Free: [phone] Fax: [phone]

## 2017-10-08 ENCOUNTER — Ambulatory Visit: Payer: Medicare Other

## 2017-10-08 ENCOUNTER — Other Ambulatory Visit: Payer: Self-pay

## 2017-10-08 NOTE — Patient Outreach (Signed)
[facility]) Care Management  10/08/2017  Artie Takayama Oct 31, 1955 [id number]   Telephone Screen  Referral Date: 10/07/17 Referral Source: EMMI Prevent Referral Reason: "patient inquired about info in regards to dentures and has a high risk for falling" Insurance: [facility] Medicare   Outreach attempt #2 to patient. Spoke with patient who voiced that she could not talk right now as she was "on the way to an appt." Advised that RN CM would call back at another time.      Plan: RN CM will make outreach attempt to patient within 3-4 business days.  Enzo Montgomery, RN,BSN,CCM [place] Management Telephonic Care Management Coordinator Direct Phone: [phone] Toll Free: [phone] Fax: [phone]

## 2017-10-09 ENCOUNTER — Ambulatory Visit: Payer: Medicare Other | Admitting: Podiatry

## 2017-10-09 ENCOUNTER — Ambulatory Visit: Payer: Self-pay

## 2017-10-13 ENCOUNTER — Other Ambulatory Visit: Payer: Self-pay

## 2017-10-13 ENCOUNTER — Encounter: Payer: Self-pay | Admitting: Podiatry

## 2017-10-13 ENCOUNTER — Ambulatory Visit (INDEPENDENT_AMBULATORY_CARE_PROVIDER_SITE_OTHER): Payer: Medicare Other | Admitting: Podiatry

## 2017-10-13 DIAGNOSIS — E0842 Diabetes mellitus due to underlying condition with diabetic polyneuropathy: Secondary | ICD-10-CM | POA: Diagnosis not present

## 2017-10-13 DIAGNOSIS — M79674 Pain in right toe(s): Secondary | ICD-10-CM

## 2017-10-13 DIAGNOSIS — B351 Tinea unguium: Secondary | ICD-10-CM

## 2017-10-13 DIAGNOSIS — M79675 Pain in left toe(s): Secondary | ICD-10-CM | POA: Diagnosis not present

## 2017-10-13 NOTE — Progress Notes (Signed)
Complaint:  Visit Type: Patient returns to my office for continued preventative foot care services. Complaint: Patient states" my nails have grown long and thick and become painful to walk and wear shoes" Patient has been diagnosed with DM with neuropathy.. The patient presents for preventative foot care services. No changes to ROS.    Podiatric Exam: Vascular: dorsalis pedis and posterior tibial pulses are weakly  palpable bilateral. Capillary return is immediate. Cold feet noted. Skin turgor WNL  Sensorium: Diminished  Semmes Weinstein monofilament test. Normal tactile sensation bilaterally. Nail Exam: Pt has thick disfigured discolored nails with subungual debris noted bilateral entire nail hallux through fifth toenails Ulcer Exam: There is no evidence of ulcer or pre-ulcerative changes or infection. Orthopedic Exam: Muscle tone and strength are WNL. No limitations in general ROM. No crepitus or effusions noted. Foot type and digits show no abnormalities. Bony prominences are unremarkable. Skin: No Porokeratosis. No infection or ulcers  Diagnosis:  Onychomycosis, , Pain in right toe, pain in left toes  Treatment & Plan Procedures and Treatment: Consent by patient was obtained for treatment procedures.   Debridement of mycotic and hypertrophic toenails, 1 through 5 bilateral and clearing of subungual debris. No ulceration, no infection noted.  Discussed peripheral neuropathy with patient and she is allergic to medicine for her neuropathy. Return Visit-Office Procedure: Patient instructed to return to the office for a follow up visit 3 months for continued evaluation and treatment.    Gardiner Barefoot DPM

## 2017-10-13 NOTE — Patient Outreach (Signed)
[facility]) Care Management  10/13/2017  Rose [name] November 03, 1955 [id number]   Telephone Screen  Referral Date:10/07/17 Referral Source:EMMI Prevent Referral Reason: "patient inquired about info in regards to dentures and has a high risk for falling" Insurance:[employer] Medicare    Outreach attempt #3 to patient. No answer at present.     Plan: RN CM will close case if no response from letter mailed to patient.  Enzo Montgomery, RN,BSN,CCM [place] Management Telephonic Care Management Coordinator Direct Phone: [phone] Toll Free: [phone] Fax: [phone]

## 2017-10-16 ENCOUNTER — Other Ambulatory Visit: Payer: Self-pay

## 2017-10-16 ENCOUNTER — Emergency Department (HOSPITAL_COMMUNITY)
Admission: EM | Admit: 2017-10-16 | Discharge: 2017-10-16 | Disposition: A | Discharge status: Home / Self Care | Payer: Medicare Other | Attending: Emergency Medicine | Admitting: Emergency Medicine

## 2017-10-16 ENCOUNTER — Emergency Department (HOSPITAL_COMMUNITY): Payer: Medicare Other

## 2017-10-16 ENCOUNTER — Encounter (HOSPITAL_COMMUNITY): Payer: Self-pay

## 2017-10-16 ENCOUNTER — Encounter

## 2017-10-16 ENCOUNTER — Ambulatory Visit: Payer: Medicare Other | Admitting: Podiatry

## 2017-10-16 DIAGNOSIS — R220 Localized swelling, mass and lump, head: Secondary | ICD-10-CM | POA: Diagnosis not present

## 2017-10-16 DIAGNOSIS — W2210XA Striking against or struck by unspecified automobile airbag, initial encounter: Secondary | ICD-10-CM | POA: Diagnosis not present

## 2017-10-16 DIAGNOSIS — S0081XA Abrasion of other part of head, initial encounter: Secondary | ICD-10-CM | POA: Diagnosis not present

## 2017-10-16 DIAGNOSIS — Z043 Encounter for examination and observation following other accident: Secondary | ICD-10-CM | POA: Diagnosis present

## 2017-10-16 DIAGNOSIS — Y999 Unspecified external cause status: Secondary | ICD-10-CM | POA: Diagnosis not present

## 2017-10-16 DIAGNOSIS — Y929 Unspecified place or not applicable: Secondary | ICD-10-CM | POA: Insufficient documentation

## 2017-10-16 DIAGNOSIS — S80212A Abrasion, left knee, initial encounter: Secondary | ICD-10-CM | POA: Diagnosis not present

## 2017-10-16 DIAGNOSIS — M25569 Pain in unspecified knee: Secondary | ICD-10-CM | POA: Diagnosis not present

## 2017-10-16 DIAGNOSIS — Z79891 Long term (current) use of opiate analgesic: Secondary | ICD-10-CM | POA: Diagnosis not present

## 2017-10-16 DIAGNOSIS — Y939 Activity, unspecified: Secondary | ICD-10-CM | POA: Insufficient documentation

## 2017-10-16 MED ORDER — BACITRACIN-NEOMYCIN-POLYMYXIN 400-5-5000 EX OINT
TOPICAL_OINTMENT | Freq: Once | CUTANEOUS | Status: AC
  Administered 2017-10-16: 1 via TOPICAL
  Filled 2017-10-16: qty 1
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
## 2017-10-16 NOTE — Discharge Instructions (Addendum)
Take tylenol as needed for pain. Use Neosporin on the wounds twice a day and clean the areas with antibacterial soap and warm water. Follow up with your doctor in the next few days for recheck. Return here as needed.

## 2017-10-16 NOTE — ED Notes (Signed)
Bed: WTR5 Expected date:  Expected time:  Means of arrival:  Comments: 
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
## 2017-10-18 ENCOUNTER — Emergency Department (HOSPITAL_COMMUNITY)
Admission: EM | Admit: 2017-10-18 | Discharge: 2017-10-18 | Disposition: A | Discharge status: Home / Self Care | Payer: Medicare Other | Attending: Emergency Medicine | Admitting: Emergency Medicine

## 2017-10-18 ENCOUNTER — Other Ambulatory Visit: Payer: Self-pay

## 2017-10-18 DIAGNOSIS — F329 Major depressive disorder, single episode, unspecified: Secondary | ICD-10-CM | POA: Insufficient documentation

## 2017-10-18 DIAGNOSIS — Z7982 Long term (current) use of aspirin: Secondary | ICD-10-CM | POA: Insufficient documentation

## 2017-10-18 DIAGNOSIS — M79605 Pain in left leg: Secondary | ICD-10-CM

## 2017-10-18 DIAGNOSIS — S20219A Contusion of unspecified front wall of thorax, initial encounter: Secondary | ICD-10-CM | POA: Diagnosis not present

## 2017-10-18 DIAGNOSIS — S0081XA Abrasion of other part of head, initial encounter: Secondary | ICD-10-CM | POA: Diagnosis not present

## 2017-10-18 DIAGNOSIS — E119 Type 2 diabetes mellitus without complications: Secondary | ICD-10-CM | POA: Insufficient documentation

## 2017-10-18 DIAGNOSIS — Z79899 Other long term (current) drug therapy: Secondary | ICD-10-CM | POA: Insufficient documentation

## 2017-10-18 DIAGNOSIS — I10 Essential (primary) hypertension: Secondary | ICD-10-CM | POA: Diagnosis not present

## 2017-10-18 DIAGNOSIS — J45909 Unspecified asthma, uncomplicated: Secondary | ICD-10-CM | POA: Insufficient documentation

## 2017-10-18 DIAGNOSIS — F17210 Nicotine dependence, cigarettes, uncomplicated: Secondary | ICD-10-CM | POA: Insufficient documentation

## 2017-10-18 DIAGNOSIS — F419 Anxiety disorder, unspecified: Secondary | ICD-10-CM | POA: Insufficient documentation

## 2017-10-18 DIAGNOSIS — S8002XA Contusion of left knee, initial encounter: Secondary | ICD-10-CM | POA: Diagnosis not present

## 2017-10-18 DIAGNOSIS — S0083XA Contusion of other part of head, initial encounter: Secondary | ICD-10-CM | POA: Diagnosis not present

## 2017-10-18 MED ORDER — KETOROLAC TROMETHAMINE 15 MG/ML IJ SOLN
15.0000 mg | Freq: Once | INTRAMUSCULAR | Status: AC
  Administered 2017-10-18: 15 mg via INTRAMUSCULAR
  Filled 2017-10-18: qty 1

## 2017-10-18 NOTE — ED Notes (Signed)
Pt appears to be under the influence of legal or illegal drugs. ED Provider made aware. Pt is still AOx4 and ambulatory however pt is complaining of pain and is falling asleep sitting up on cane.

## 2017-10-18 NOTE — ED Notes (Signed)
ED Provider at bedside. 

## 2017-10-18 NOTE — Discharge Instructions (Addendum)
Please return for any problem.  Follow-up with your regular doctor as instructed. °
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
## 2017-10-18 NOTE — ED Notes (Signed)
Bed: [facility] Expected date:  Expected time:  Means of arrival:  Comments: 
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
## 2017-10-19 ENCOUNTER — Other Ambulatory Visit: Payer: Self-pay | Admitting: Internal Medicine

## 2017-10-19 DIAGNOSIS — Z794 Long term (current) use of insulin: Secondary | ICD-10-CM

## 2017-10-19 DIAGNOSIS — E088 Diabetes mellitus due to underlying condition with unspecified complications: Secondary | ICD-10-CM

## 2017-10-20 ENCOUNTER — Other Ambulatory Visit: Payer: Self-pay

## 2017-10-20 NOTE — Telephone Encounter (Signed)
Medication was discontinued 07/16/17 per Dr Juleen China. Called pt - stated she has not been taking Metformin; f/u appt was scheduled for next week Monday in [facility] @ 1315 PM.

## 2017-10-20 NOTE — Patient Outreach (Signed)
[facility]) Care Management  10/20/2017  Larenda Reedy 1955/12/22 [id number]   Telephone Screen  Referral Date:10/07/17 Referral Source:EMMI Prevent Referral Reason: "patient inquired about info in regards to dentures and has a high risk for falling" Insurance:[employer] Medicare   Multiple attempts to establish contact with patient without success. No response from letter mailed to patient. Case is being closed at this time.    Plan: RN CM will close case at this time.   Enzo Montgomery, RN,BSN,CCM [facility] Management Telephonic Care Management Coordinator Direct Phone: [phone] Toll Free: [phone] Fax: [phone]

## 2017-10-21 ENCOUNTER — Other Ambulatory Visit: Payer: Self-pay

## 2017-10-21 NOTE — Patient Outreach (Signed)
[facility]) Care Management  10/21/2017  Reisha Wos 1955-04-08 [id number]   Medication Adherence call to Mrs. Chariti [place] left a message for patient to call back patient is due on Metformin 1000 mg. Mrs. Fragoso is showing past due under [place].  [place] Management Direct Dial [phone]  Fax [phone] [email or web address]

## 2017-10-26 ENCOUNTER — Encounter: Payer: Self-pay | Admitting: Internal Medicine

## 2017-10-26 ENCOUNTER — Other Ambulatory Visit: Payer: Self-pay

## 2017-10-26 ENCOUNTER — Ambulatory Visit (INDEPENDENT_AMBULATORY_CARE_PROVIDER_SITE_OTHER): Payer: Medicare Other | Admitting: Internal Medicine

## 2017-10-26 VITALS — BP 156/66 | HR 68 | Temp 98.4°F | Ht 62.0 in | Wt 161.8 lb

## 2017-10-26 DIAGNOSIS — M62838 Other muscle spasm: Secondary | ICD-10-CM

## 2017-10-26 DIAGNOSIS — Z79899 Other long term (current) drug therapy: Secondary | ICD-10-CM

## 2017-10-26 DIAGNOSIS — I10 Essential (primary) hypertension: Secondary | ICD-10-CM | POA: Diagnosis not present

## 2017-10-26 DIAGNOSIS — J45909 Unspecified asthma, uncomplicated: Secondary | ICD-10-CM

## 2017-10-26 DIAGNOSIS — S80212D Abrasion, left knee, subsequent encounter: Secondary | ICD-10-CM | POA: Diagnosis not present

## 2017-10-26 DIAGNOSIS — E118 Type 2 diabetes mellitus with unspecified complications: Secondary | ICD-10-CM

## 2017-10-26 DIAGNOSIS — E088 Diabetes mellitus due to underlying condition with unspecified complications: Secondary | ICD-10-CM

## 2017-10-26 DIAGNOSIS — Z720 Tobacco use: Secondary | ICD-10-CM

## 2017-10-26 DIAGNOSIS — F17200 Nicotine dependence, unspecified, uncomplicated: Secondary | ICD-10-CM

## 2017-10-26 DIAGNOSIS — Z9109 Other allergy status, other than to drugs and biological substances: Secondary | ICD-10-CM

## 2017-10-26 DIAGNOSIS — S0081XD Abrasion of other part of head, subsequent encounter: Secondary | ICD-10-CM

## 2017-10-26 DIAGNOSIS — Z8619 Personal history of other infectious and parasitic diseases: Secondary | ICD-10-CM

## 2017-10-26 DIAGNOSIS — Z794 Long term (current) use of insulin: Secondary | ICD-10-CM

## 2017-10-26 MED ORDER — TIZANIDINE HCL 4 MG PO TABS: ORAL_TABLET | ORAL | 0 refills | Status: DC

## 2017-10-26 MED ORDER — HYDROCHLOROTHIAZIDE 25 MG PO TABS: 25.0000 mg | ORAL_TABLET | Freq: Every day | ORAL | 1 refills | Status: DC

## 2017-10-26 MED ORDER — FLUTICASONE PROPIONATE 50 MCG/ACT NA SUSP: 1.0000 | Freq: Every day | NASAL | 0 refills | Status: DC

## 2017-10-26 MED ORDER — NICOTINE 14 MG/24HR TD PT24: MEDICATED_PATCH | TRANSDERMAL | 0 refills | Status: DC
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
## 2017-10-27 LAB — CMP14 + ANION GAP
ALT: 11 IU/L (ref 0–32)
AST: 20 IU/L (ref 0–40)
Albumin/Globulin Ratio: 1.4 (ref 1.2–2.2)
Albumin: 4.1 g/dL (ref 3.6–4.8)
Alkaline Phosphatase: 68 IU/L (ref 39–117)
Anion Gap: 15 mmol/L (ref 10.0–18.0)
BUN/Creatinine Ratio: 11 — ABNORMAL LOW (ref 12–28)
BUN: 14 mg/dL (ref 8–27)
Bilirubin Total: 0.3 mg/dL (ref 0.0–1.2)
CO2: 24 mmol/L (ref 20–29)
Calcium: 9.1 mg/dL (ref 8.7–10.3)
Chloride: 104 mmol/L (ref 96–106)
Creatinine, Ser: 1.3 mg/dL — ABNORMAL HIGH (ref 0.57–1.00)
GFR calc Af Amer: 52 mL/min/1.73 — ABNORMAL LOW
GFR calc non Af Amer: 45 mL/min/1.73 — ABNORMAL LOW
Globulin, Total: 2.9 g/dL (ref 1.5–4.5)
Glucose: 100 mg/dL — ABNORMAL HIGH (ref 65–99)
Potassium: 4.4 mmol/L (ref 3.5–5.2)
Sodium: 143 mmol/L (ref 134–144)
Total Protein: 7 g/dL (ref 6.0–8.5)
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
## 2017-10-27 NOTE — Assessment & Plan Note (Signed)
Refill provided for her flonase as requested
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
## 2017-10-27 NOTE — Assessment & Plan Note (Signed)
-   Last A1c 5.5 on 07/16/2017 - Currently on no therapy - Can switch to annual A1c checks

## 2017-10-30 DIAGNOSIS — Z79891 Long term (current) use of opiate analgesic: Secondary | ICD-10-CM | POA: Diagnosis not present

## 2017-11-01 NOTE — Progress Notes (Signed)
Internal Medicine Clinic Attending  I saw and evaluated the patient.  I personally confirmed the key portions of the history and exam documented by Dr. Lee and I reviewed pertinent patient test results.  The assessment, diagnosis, and plan were formulated together and I agree with the documentation in the resident's note.  

## 2017-11-03 ENCOUNTER — Telehealth: Payer: Self-pay | Admitting: Internal Medicine

## 2017-11-03 NOTE — Telephone Encounter (Signed)
Spoke with patient about her lab results. Discussed how her creatinine is back to baseline and all other labs are normal. Patient expressed understanding.

## 2017-11-04 DIAGNOSIS — M25552 Pain in left hip: Secondary | ICD-10-CM | POA: Diagnosis not present

## 2017-11-04 DIAGNOSIS — I10 Essential (primary) hypertension: Secondary | ICD-10-CM | POA: Diagnosis not present

## 2017-11-04 DIAGNOSIS — M7062 Trochanteric bursitis, left hip: Secondary | ICD-10-CM | POA: Diagnosis not present

## 2017-11-05 ENCOUNTER — Other Ambulatory Visit: Payer: Self-pay

## 2017-11-05 ENCOUNTER — Ambulatory Visit (INDEPENDENT_AMBULATORY_CARE_PROVIDER_SITE_OTHER): Payer: Medicare Other | Admitting: Internal Medicine

## 2017-11-05 ENCOUNTER — Encounter: Payer: Self-pay | Admitting: Internal Medicine

## 2017-11-05 ENCOUNTER — Ambulatory Visit (HOSPITAL_COMMUNITY)
Admission: RE | Admit: 2017-11-05 | Discharge: 2017-11-05 | Disposition: A | Discharge status: Home / Self Care | Payer: Medicare Other | Source: Ambulatory Visit | Attending: Internal Medicine | Admitting: Internal Medicine

## 2017-11-05 ENCOUNTER — Telehealth: Payer: Self-pay | Admitting: Internal Medicine

## 2017-11-05 VITALS — BP 158/69 | HR 68 | Temp 98.9°F | Ht 62.0 in | Wt 163.0 lb

## 2017-11-05 DIAGNOSIS — R109 Unspecified abdominal pain: Secondary | ICD-10-CM | POA: Insufficient documentation

## 2017-11-05 DIAGNOSIS — M79652 Pain in left thigh: Secondary | ICD-10-CM | POA: Diagnosis not present

## 2017-11-05 DIAGNOSIS — S8992XA Unspecified injury of left lower leg, initial encounter: Secondary | ICD-10-CM | POA: Diagnosis not present

## 2017-11-05 DIAGNOSIS — M25562 Pain in left knee: Secondary | ICD-10-CM | POA: Diagnosis not present

## 2017-11-05 DIAGNOSIS — S3993XA Unspecified injury of pelvis, initial encounter: Secondary | ICD-10-CM | POA: Diagnosis not present

## 2017-11-05 MED ORDER — TIZANIDINE HCL 4 MG PO TABS: 8.0000 mg | ORAL_TABLET | Freq: Three times a day (TID) | ORAL | 0 refills | Status: AC | PRN

## 2017-11-05 NOTE — Telephone Encounter (Signed)
I called patient to discuss x-ray results.  Was not able to reach her but I did leave a voice message.  She had a plain film of the pelvis and the left knee which were all normal with no obvious fractures or displacement.

## 2017-11-05 NOTE — Patient Instructions (Addendum)
Rose [name],  It was a pleasure taking care of you here at the clinic today.  Here is brief summary of what we discussed at your visit today  1.  I am ordering an x-ray of your pelvis  2.  I am ordering an x-ray of your left knee 3.  I am getting a urine sample to check for kidney stones because of your left back pain 4.  I am increasing your Zanaflex dose to 8 mg.  You will take this 3 times daily for 1 week  5.  I am referring you to physical therapy and you should receive a call to make an appointment. 6.  Please stop taking Aleve on Sunday because of your kidney problem. 7.  I will you to follow-up at the clinic in 1 week to see how you are doing.  ~Dr. Eileen Stanford
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
## 2017-11-09 NOTE — Progress Notes (Signed)
Internal Medicine Clinic Attending  I saw and evaluated the patient.  I personally confirmed the key portions of the history and exam documented by Dr. Agyei and I reviewed pertinent patient test results.  The assessment, diagnosis, and plan were formulated together and I agree with the documentation in the resident's note.  

## 2017-11-09 NOTE — Addendum Note (Signed)
Addended by: Gilles Chiquito B on: 11/09/2017 07:13 AM   Modules accepted: Level of Service

## 2017-11-11 DIAGNOSIS — Z79891 Long term (current) use of opiate analgesic: Secondary | ICD-10-CM | POA: Diagnosis not present

## 2017-11-12 ENCOUNTER — Ambulatory Visit: Payer: Medicare Other

## 2017-11-12 ENCOUNTER — Encounter: Payer: Self-pay | Admitting: Internal Medicine

## 2017-11-16 ENCOUNTER — Telehealth: Payer: Self-pay | Admitting: Internal Medicine

## 2017-11-16 ENCOUNTER — Other Ambulatory Visit: Payer: Self-pay | Admitting: General Surgery

## 2017-11-16 DIAGNOSIS — L720 Epidermal cyst: Secondary | ICD-10-CM | POA: Diagnosis not present

## 2017-11-19 ENCOUNTER — Ambulatory Visit: Payer: Medicare Other

## 2017-11-19 ENCOUNTER — Encounter: Payer: Self-pay | Admitting: Internal Medicine

## 2017-11-20 ENCOUNTER — Other Ambulatory Visit: Payer: Self-pay | Admitting: Internal Medicine

## 2017-11-20 DIAGNOSIS — Z794 Long term (current) use of insulin: Secondary | ICD-10-CM

## 2017-11-20 DIAGNOSIS — E088 Diabetes mellitus due to underlying condition with unspecified complications: Secondary | ICD-10-CM

## 2017-11-24 ENCOUNTER — Other Ambulatory Visit: Payer: Self-pay

## 2017-11-24 NOTE — Patient Outreach (Signed)
[facility]) Care Management  11/24/2017  Rose [name] 12/30/1955 [id number]   Medication Adherence call to [name] patient is due on Metformin 1000 mg spoke with patient she is no longer taking this medication doctor took her off. [name] is showing past due under [facility].  [facility] Management Direct Dial [phone]  Fax [phone] [email or web address]

## 2017-11-25 ENCOUNTER — Ambulatory Visit: Payer: Medicare Other | Attending: Internal Medicine | Admitting: Physical Therapy

## 2017-11-25 ENCOUNTER — Encounter: Payer: Self-pay | Admitting: Physical Therapy

## 2017-11-25 DIAGNOSIS — M25562 Pain in left knee: Secondary | ICD-10-CM | POA: Diagnosis not present

## 2017-11-25 DIAGNOSIS — M25552 Pain in left hip: Secondary | ICD-10-CM | POA: Diagnosis not present
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
## 2017-12-02 ENCOUNTER — Encounter: Payer: Self-pay | Admitting: Physical Therapy

## 2017-12-02 ENCOUNTER — Ambulatory Visit: Payer: Medicare Other | Attending: Internal Medicine | Admitting: Physical Therapy

## 2017-12-02 DIAGNOSIS — M25562 Pain in left knee: Secondary | ICD-10-CM | POA: Diagnosis not present

## 2017-12-02 DIAGNOSIS — M25552 Pain in left hip: Secondary | ICD-10-CM
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
## 2017-12-07 ENCOUNTER — Other Ambulatory Visit: Payer: Self-pay

## 2017-12-07 ENCOUNTER — Encounter (INDEPENDENT_AMBULATORY_CARE_PROVIDER_SITE_OTHER): Payer: Self-pay

## 2017-12-07 ENCOUNTER — Ambulatory Visit: Payer: Medicare Other

## 2017-12-07 ENCOUNTER — Other Ambulatory Visit: Payer: Self-pay | Admitting: Internal Medicine

## 2017-12-07 ENCOUNTER — Ambulatory Visit (INDEPENDENT_AMBULATORY_CARE_PROVIDER_SITE_OTHER): Payer: Medicare Other | Admitting: Internal Medicine

## 2017-12-07 VITALS — BP 139/67 | HR 70 | Temp 98.6°F | Ht 62.0 in | Wt 164.2 lb

## 2017-12-07 DIAGNOSIS — M25562 Pain in left knee: Secondary | ICD-10-CM | POA: Diagnosis not present

## 2017-12-07 DIAGNOSIS — E785 Hyperlipidemia, unspecified: Secondary | ICD-10-CM

## 2017-12-07 DIAGNOSIS — I5032 Chronic diastolic (congestive) heart failure: Secondary | ICD-10-CM

## 2017-12-07 DIAGNOSIS — E1169 Type 2 diabetes mellitus with other specified complication: Secondary | ICD-10-CM

## 2017-12-07 DIAGNOSIS — M549 Dorsalgia, unspecified: Secondary | ICD-10-CM | POA: Diagnosis not present

## 2017-12-07 DIAGNOSIS — M779 Enthesopathy, unspecified: Secondary | ICD-10-CM

## 2017-12-07 DIAGNOSIS — M62838 Other muscle spasm: Secondary | ICD-10-CM

## 2017-12-07 DIAGNOSIS — F339 Major depressive disorder, recurrent, unspecified: Secondary | ICD-10-CM

## 2017-12-07 DIAGNOSIS — M25552 Pain in left hip: Secondary | ICD-10-CM | POA: Diagnosis not present

## 2017-12-07 DIAGNOSIS — G8911 Acute pain due to trauma: Secondary | ICD-10-CM

## 2017-12-07 DIAGNOSIS — Z760 Encounter for issue of repeat prescription: Secondary | ICD-10-CM | POA: Insufficient documentation

## 2017-12-07 DIAGNOSIS — Z9109 Other allergy status, other than to drugs and biological substances: Secondary | ICD-10-CM

## 2017-12-07 DIAGNOSIS — F32A Depression, unspecified: Secondary | ICD-10-CM

## 2017-12-07 DIAGNOSIS — E119 Type 2 diabetes mellitus without complications: Secondary | ICD-10-CM

## 2017-12-07 DIAGNOSIS — G8929 Other chronic pain: Secondary | ICD-10-CM | POA: Diagnosis not present

## 2017-12-07 DIAGNOSIS — Z79899 Other long term (current) drug therapy: Secondary | ICD-10-CM

## 2017-12-07 DIAGNOSIS — F329 Major depressive disorder, single episode, unspecified: Secondary | ICD-10-CM

## 2017-12-07 DIAGNOSIS — I110 Hypertensive heart disease with heart failure: Secondary | ICD-10-CM

## 2017-12-07 DIAGNOSIS — I10 Essential (primary) hypertension: Secondary | ICD-10-CM

## 2017-12-07 DIAGNOSIS — M545 Low back pain: Secondary | ICD-10-CM

## 2017-12-07 MED ORDER — DULOXETINE HCL 60 MG PO CPEP: ORAL_CAPSULE | ORAL | 0 refills | Status: DC

## 2017-12-07 MED ORDER — ATORVASTATIN CALCIUM 40 MG PO TABS: 40.0000 mg | ORAL_TABLET | Freq: Every day | ORAL | 3 refills | Status: DC

## 2017-12-07 MED ORDER — ATENOLOL 50 MG PO TABS: ORAL_TABLET | ORAL | 0 refills | Status: DC

## 2017-12-07 MED ORDER — FLUTICASONE PROPIONATE 50 MCG/ACT NA SUSP: 1.0000 | Freq: Every day | NASAL | 0 refills | Status: DC

## 2017-12-07 MED ORDER — HYDROCHLOROTHIAZIDE 25 MG PO TABS: 25.0000 mg | ORAL_TABLET | Freq: Every day | ORAL | 1 refills | Status: DC

## 2017-12-07 MED ORDER — TIZANIDINE HCL 4 MG PO TABS: 4.0000 mg | ORAL_TABLET | Freq: Three times a day (TID) | ORAL | 0 refills | Status: DC

## 2017-12-07 MED ORDER — CETIRIZINE HCL 10 MG PO TABS: 10.0000 mg | ORAL_TABLET | Freq: Every day | ORAL | 3 refills | Status: DC

## 2017-12-07 MED ORDER — CITALOPRAM HYDROBROMIDE 20 MG PO TABS: 20.0000 mg | ORAL_TABLET | Freq: Every day | ORAL | 2 refills | Status: DC

## 2017-12-07 NOTE — Assessment & Plan Note (Signed)
Assessment: Rose [name] left thigh and knee pain has much improved with physical therapy and OTC Aleve. We will continue to monitor her progress in the following months.  Plan: 1. Continue OTC Aleve as needed for pain
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
## 2017-12-07 NOTE — Assessment & Plan Note (Addendum)
I refilled medications for the following problems today: HTN, depression, environmental allergies, Chronic diastolic heart failure, muscle spasm.
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
## 2017-12-08 ENCOUNTER — Ambulatory Visit: Payer: Medicare Other | Admitting: Physical Therapy

## 2017-12-10 ENCOUNTER — Ambulatory Visit: Payer: Medicare Other | Admitting: Physical Therapy

## 2017-12-10 DIAGNOSIS — M25562 Pain in left knee: Secondary | ICD-10-CM | POA: Diagnosis not present

## 2017-12-10 DIAGNOSIS — M25552 Pain in left hip: Secondary | ICD-10-CM | POA: Diagnosis not present

## 2017-12-10 NOTE — Progress Notes (Signed)
Internal Medicine Clinic Attending  I saw and evaluated the patient.  I personally confirmed the key portions of the history and exam documented by Dr. Prince and I reviewed pertinent patient test results.  The assessment, diagnosis, and plan were formulated together and I agree with the documentation in the resident's note.  Alexander Raines, M.D., Ph.D.  
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
## 2017-12-11 DIAGNOSIS — Z79891 Long term (current) use of opiate analgesic: Secondary | ICD-10-CM | POA: Diagnosis not present

## 2017-12-15 ENCOUNTER — Encounter: Payer: Self-pay | Admitting: Physical Therapy

## 2017-12-15 ENCOUNTER — Ambulatory Visit: Payer: Medicare Other | Admitting: Physical Therapy

## 2017-12-15 DIAGNOSIS — M25562 Pain in left knee: Secondary | ICD-10-CM

## 2017-12-15 DIAGNOSIS — M25552 Pain in left hip: Secondary | ICD-10-CM | POA: Diagnosis not present
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
## 2017-12-17 ENCOUNTER — Ambulatory Visit: Payer: Medicare Other | Admitting: Physical Therapy

## 2017-12-17 ENCOUNTER — Encounter: Payer: Self-pay | Admitting: Physical Therapy

## 2017-12-17 DIAGNOSIS — M25562 Pain in left knee: Secondary | ICD-10-CM | POA: Diagnosis not present

## 2017-12-17 DIAGNOSIS — M25552 Pain in left hip: Secondary | ICD-10-CM | POA: Diagnosis not present
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
## 2017-12-24 ENCOUNTER — Encounter

## 2017-12-28 ENCOUNTER — Ambulatory Visit: Payer: Medicare Other | Admitting: Physical Therapy

## 2017-12-28 DIAGNOSIS — M25552 Pain in left hip: Secondary | ICD-10-CM | POA: Diagnosis not present

## 2017-12-28 DIAGNOSIS — M25562 Pain in left knee: Secondary | ICD-10-CM | POA: Diagnosis not present
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
## 2017-12-29 ENCOUNTER — Ambulatory Visit: Payer: Medicare Other | Attending: Internal Medicine | Admitting: Physical Therapy

## 2017-12-29 DIAGNOSIS — M25562 Pain in left knee: Secondary | ICD-10-CM | POA: Diagnosis not present

## 2017-12-29 DIAGNOSIS — M25552 Pain in left hip: Secondary | ICD-10-CM | POA: Diagnosis not present
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
## 2018-01-01 DIAGNOSIS — Z79891 Long term (current) use of opiate analgesic: Secondary | ICD-10-CM | POA: Diagnosis not present

## 2018-01-05 ENCOUNTER — Ambulatory Visit: Payer: Medicare Other | Admitting: Physical Therapy

## 2018-01-07 ENCOUNTER — Ambulatory Visit: Payer: Medicare Other | Admitting: Physical Therapy

## 2018-01-11 ENCOUNTER — Ambulatory Visit (INDEPENDENT_AMBULATORY_CARE_PROVIDER_SITE_OTHER): Payer: Medicare Other | Admitting: Podiatry

## 2018-01-11 DIAGNOSIS — Q828 Other specified congenital malformations of skin: Secondary | ICD-10-CM | POA: Diagnosis not present

## 2018-01-11 DIAGNOSIS — E1142 Type 2 diabetes mellitus with diabetic polyneuropathy: Secondary | ICD-10-CM | POA: Diagnosis not present

## 2018-01-11 DIAGNOSIS — B351 Tinea unguium: Secondary | ICD-10-CM | POA: Diagnosis not present

## 2018-01-11 NOTE — Progress Notes (Signed)
Subjective:  Patient ID: Rose [name], female    DOB: April 02, 1955,  MRN: [id number]  Chief Complaint  Patient presents with  . debride    BL nail trimming -Diabetic under control     62 y.o. female presents  for diabetic foot care. Last AMBS was unkown. Sugars now only diet controlled. Reports numbness and tingling in their feet. Denies cramping in legs and thighs.  Review of Systems: Negative except as noted in the HPI. Denies N/V/F/Ch.  Past Medical History:  Diagnosis Date  . Anxiety   . Arthritis   . Asthma   . Chronic back pain   . Depression   . Diabetes mellitus   . Edema of lower extremity history cellulitis jan 2013  . Frequency of urination   . Hep C w/o coma, chronic ([place])   . History of chronic bronchitis   . History of drug abuse states quit herion in 2006  . Hypertension   . Methadone maintenance therapy patient ([place])   . Nocturia   . Tendonitis   . Urge urinary incontinence     Current Outpatient Medications:  .  albuterol (PROVENTIL HFA;VENTOLIN HFA) 108 (90 Base) MCG/ACT inhaler, Inhale 2 puffs into the lungs every 6 (six) hours as needed. For wheezing, Disp: 1 Inhaler, Rfl: 2 .  aspirin EC 81 MG tablet, Take 1 tablet (81 mg total) by mouth daily., Disp: , Rfl:  .  atenolol (TENORMIN) 50 MG tablet, TAKE 1/2 TABLET(25 MG) BY MOUTH DAILY, Disp: 45 tablet, Rfl: 0 .  atorvastatin (LIPITOR) 40 MG tablet, Take 1 tablet (40 mg total) by mouth daily., Disp: 90 tablet, Rfl: 3 .  cetirizine (ZYRTEC) 10 MG tablet, Take 1 tablet (10 mg total) by mouth daily., Disp: 90 tablet, Rfl: 3 .  DULoxetine (CYMBALTA) 60 MG capsule, TAKE 1 CAPSULE(60 MG) BY MOUTH DAILY, Disp: 90 capsule, Rfl: 0 .  fluticasone (FLONASE) 50 MCG/ACT nasal spray, Place 1 spray into both nostrils daily., Disp: 1 g, Rfl: 0 .  glucose blood (ACCU-CHEK AVIVA PLUS) test strip, Check blood sugar 3 times a day with accu chek aviva plus strips, Disp: 100 each, Rfl: 5 .  hydrochlorothiazide (HYDRODIURIL)  25 MG tablet, Take 1 tablet (25 mg total) by mouth daily., Disp: 90 tablet, Rfl: 1 .  Lancet Devices (ACCU-CHEK SOFTCLIX) lancets, Check blood sugar 3 times a day. Patient is insulin requiring, ICD 10 code E11.9., Disp: 100 each, Rfl: 12 .  Lancets 30G MISC, Check blood sugar 3 times a day, Disp: 100 each, Rfl: 5 .  methadone (DOLOPHINE) 10 MG/5ML solution, Take 100 mg by mouth daily., Disp: , Rfl:  .  Multiple Vitamins-Minerals (ONE-A-DAY WOMENS 50+ ADVANTAGE) TABS, Take by mouth daily., Disp: , Rfl:  .  naproxen sodium (ALEVE) 220 MG tablet, Take 220 mg by mouth as needed., Disp: , Rfl:  .  nicotine (NICODERM CQ - DOSED IN MG/24 HOURS) 14 mg/24hr patch, Medicaid [id number] L Wks 1-6: 14 mg x 1 patch daily. Wear for 24 hours. If you have sleep disturbances, remove at bedtime., Disp: 42 patch, Rfl: 0 .  tiZANidine (ZANAFLEX) 4 MG tablet, Take 1 tablet (4 mg total) by mouth 3 (three) times daily., Disp: 90 tablet, Rfl: 0 .  traZODone (DESYREL) 100 MG tablet, Take 100 mg by mouth at bedtime., Disp: , Rfl: 2 .  citalopram (CELEXA) 20 MG tablet, Take 1 tablet (20 mg total) by mouth daily., Disp: 30 tablet, Rfl: 2  Social History   Tobacco Use  Smoking  Status Current Every Day Smoker  . Packs/day: 0.25  . Years: 20.00  . Pack years: 5.00  . Types: Cigarettes  Smokeless Tobacco Never Used  Tobacco Comment   6   cigs per day    Allergies  Allergen Reactions  . Lisinopril Swelling    Angioedema  . Gabapentin Hives and Other (See Comments)    Only when takes over 300 mg dose. "gives me the shakes"  . Hydrocodone-Acetaminophen Hives    Other reaction(s): GI Upset (intolerance)  . Hydrocodone-Acetaminophen Other (See Comments) and Hives  . Vicodin [Hydrocodone-Acetaminophen] Hives, Diarrhea and Other (See Comments)    Stomach  cramps   Objective:  There were no vitals filed for this visit. There is no height or weight on file to calculate BMI. Constitutional Well developed. Well nourished.   Vascular Dorsalis pedis pulses present 1+ bilaterally  Posterior tibial pulses present 1+ bilaterally  Pedal hair growth diminished. Capillary refill normal to all digits.  No cyanosis or clubbing noted.  Neurologic Normal speech. Oriented to person, place, and time. Epicritic sensation to light touch grossly present bilaterally. Protective sensation with 5.07 monofilament  absent bilaterally.  Dermatologic Nails elongated, thickened, dystrophic. No open wounds. HPK L 5th MPJ, R 1st MPJ  Orthopedic: Normal joint ROM without pain or crepitus bilaterally. No visible deformities. No bony tenderness.   Assessment:  No diagnosis found. Plan:  Patient was evaluated and treated and all questions answered.  Diabetes with DPN, Onychomycosis -Educated on diabetic footcare. Diabetic risk level 1 -Nails x10 debrided sharply and manually with large nail nipper and rotary burr.   Procedure: Nail Debridement Rationale: Patient meets criteria for routine foot care due to DPN Type of Debridement: manual, sharp debridement. Instrumentation: Nail nipper, rotary burr. Number of Nails: 10   Procedure: Paring of Lesion Rationale: painful hyperkeratotic lesion Type of Debridement: manual, sharp debridement. Instrumentation: 312 blade Number of Lesions: 2   Return in about 3 months (around 04/13/2018) for Diabetic Foot Care.

## 2018-01-12 ENCOUNTER — Encounter: Payer: Medicare Other | Admitting: Physical Therapy

## 2018-01-13 ENCOUNTER — Ambulatory Visit: Payer: Medicare Other | Admitting: Podiatry

## 2018-01-14 ENCOUNTER — Encounter: Payer: Medicare Other | Admitting: Physical Therapy

## 2018-01-14 ENCOUNTER — Telehealth: Payer: Self-pay | Admitting: Internal Medicine

## 2018-01-19 ENCOUNTER — Encounter: Payer: Medicare Other | Admitting: Physical Therapy

## 2018-01-22 ENCOUNTER — Other Ambulatory Visit: Payer: Self-pay

## 2018-01-22 DIAGNOSIS — I10 Essential (primary) hypertension: Secondary | ICD-10-CM

## 2018-01-22 MED ORDER — HYDROCHLOROTHIAZIDE 25 MG PO TABS: 25.0000 mg | ORAL_TABLET | Freq: Every day | ORAL | 1 refills | Status: DC

## 2018-01-22 NOTE — Telephone Encounter (Signed)
hydrochlorothiazide (HYDRODIURIL) 25 MG tablet, REFILL REQUEST @  [id number] - [place], [place] [phone] (Phone) [phone] (Fax)

## 2018-01-28 ENCOUNTER — Ambulatory Visit: Payer: Medicare Other | Admitting: Pharmacist

## 2018-01-28 NOTE — Progress Notes (Addendum)
Patient attended Falls Workshop on 01/28/18. Education was provided on fall risk factors, prevention, and management. In addition to history of falls other factors which may contribute to fall/fracture risk: arthritis/pain, renal disease, depression, tobacco use  Recommendations:  DEXA due to fall risk as well as fracture risk. Note from PCP in March 2017 indicated risk of major osteoporotic fracture estimated to be similar to a 60 year old patient.  Vitamin D level to evaluate for deficiency, which can further increase patient's risk of falls and fracture  Misc: check that patient is up-to-date on vision, hearing, and foot exams

## 2018-01-29 NOTE — Addendum Note (Signed)
Addended by: Orson Gear on: 01/29/2018 11:10 AM   Modules accepted: Orders

## 2018-03-01 ENCOUNTER — Other Ambulatory Visit: Payer: Self-pay | Admitting: Internal Medicine

## 2018-03-01 DIAGNOSIS — M62838 Other muscle spasm: Secondary | ICD-10-CM

## 2018-03-01 DIAGNOSIS — M545 Low back pain, unspecified: Secondary | ICD-10-CM

## 2018-03-01 DIAGNOSIS — G8929 Other chronic pain: Secondary | ICD-10-CM

## 2018-03-02 ENCOUNTER — Other Ambulatory Visit: Payer: Self-pay | Admitting: Internal Medicine

## 2018-03-02 DIAGNOSIS — Z9109 Other allergy status, other than to drugs and biological substances: Secondary | ICD-10-CM

## 2018-03-11 ENCOUNTER — Other Ambulatory Visit: Payer: Self-pay

## 2018-03-11 ENCOUNTER — Encounter: Payer: Self-pay | Admitting: Internal Medicine

## 2018-03-11 ENCOUNTER — Ambulatory Visit (INDEPENDENT_AMBULATORY_CARE_PROVIDER_SITE_OTHER): Payer: Medicare Other | Admitting: Internal Medicine

## 2018-03-11 VITALS — BP 159/74 | HR 72 | Temp 98.5°F | Ht 62.0 in | Wt 170.7 lb

## 2018-03-11 DIAGNOSIS — Z79899 Other long term (current) drug therapy: Secondary | ICD-10-CM

## 2018-03-11 DIAGNOSIS — Z9109 Other allergy status, other than to drugs and biological substances: Secondary | ICD-10-CM

## 2018-03-11 DIAGNOSIS — F17200 Nicotine dependence, unspecified, uncomplicated: Secondary | ICD-10-CM

## 2018-03-11 DIAGNOSIS — E118 Type 2 diabetes mellitus with unspecified complications: Secondary | ICD-10-CM

## 2018-03-11 DIAGNOSIS — R296 Repeated falls: Secondary | ICD-10-CM | POA: Diagnosis not present

## 2018-03-11 DIAGNOSIS — N183 Chronic kidney disease, stage 3 (moderate): Secondary | ICD-10-CM | POA: Diagnosis not present

## 2018-03-11 DIAGNOSIS — I10 Essential (primary) hypertension: Secondary | ICD-10-CM

## 2018-03-11 DIAGNOSIS — Z794 Long term (current) use of insulin: Secondary | ICD-10-CM | POA: Diagnosis not present

## 2018-03-11 DIAGNOSIS — M545 Low back pain: Secondary | ICD-10-CM

## 2018-03-11 DIAGNOSIS — M25552 Pain in left hip: Secondary | ICD-10-CM

## 2018-03-11 DIAGNOSIS — R0981 Nasal congestion: Secondary | ICD-10-CM | POA: Diagnosis not present

## 2018-03-11 DIAGNOSIS — Z720 Tobacco use: Secondary | ICD-10-CM

## 2018-03-11 LAB — POCT GLYCOSYLATED HEMOGLOBIN (HGB A1C): Hemoglobin A1C: 6.5 % — AB (ref 4.0–5.6)

## 2018-03-11 LAB — GLUCOSE, CAPILLARY: Glucose-Capillary: 107 mg/dL — ABNORMAL HIGH (ref 70–99)

## 2018-03-11 MED ORDER — NICOTINE 14 MG/24HR TD PT24: MEDICATED_PATCH | TRANSDERMAL | 0 refills | Status: DC

## 2018-03-11 MED ORDER — LIDOCAINE 5 % EX OINT: 1.0000 "application " | TOPICAL_OINTMENT | CUTANEOUS | 0 refills | Status: DC | PRN

## 2018-03-11 NOTE — Patient Instructions (Addendum)
It was nice seeing you today. Thank you for choosing [facility] for your Primary Care.   Today we talked about:   1) Back pain: I'm hopeful this will get better on its own. You can take tylenol and lidocaine patches. Stay away from Motrin, Aleve, and Advil. I have sent some lidocaine cream to your pharmacy   2) Falls: We are checking Vit D levels today and ordering a bone density scan. If these results are abnormal, I will call you.     FOLLOW-UP INSTRUCTIONS When: asap for pap smear  Please contact the clinic if you have any problems, or need to be seen sooner.

## 2018-03-11 NOTE — Assessment & Plan Note (Addendum)
Hypertensive today 159/74. Reports compliance with HCTZ 25mg . Acute low back pain probably confounding BP. Recheck at next visit. If still elevated, can increase atenolol to 100mg  daily

## 2018-03-11 NOTE — Progress Notes (Signed)
   CC: fall   HPI:  [name] is a 62 y.o. female who presents for management of recurrent falls, HTN, and diabetes.   Please see encounter tab for full details of HPI.   Past Medical History:  Diagnosis Date  . Anxiety   . Arthritis   . Asthma   . Chronic back pain   . Depression   . Diabetes mellitus   . Edema of lower extremity history cellulitis jan 2013  . Frequency of urination   . Hep C w/o coma, chronic ([place])   . History of chronic bronchitis   . History of drug abuse states quit herion in 2006  . Hypertension   . Methadone maintenance therapy patient ([place])   . Nocturia   . Tendonitis   . Urge urinary incontinence     Physical Exam:  Vitals:   03/11/18 1338  BP: (!) 159/74  Pulse: 72  Temp: 98.5 F (36.9 C)  TempSrc: Oral  SpO2: 97%  Weight: 170 lb 11.2 oz (77.4 kg)  Height: 5\' 2"  (1.575 m)   Gen: Appears older than stated age CV: RRR, no murmurs Pulm: Normal effort, CTA throughout, no wheezing MSK: TTP over lumbar spine, no step offs palpated. No tenderness over left hip of left paraspinal muscles  Neuro: 5/5 strength bilateral lower extremities with symmetric sensation    Assessment & Plan:   See Encounters Tab for problem based charting.  Patient seen with Dr. Eppie Gibson

## 2018-03-11 NOTE — Assessment & Plan Note (Addendum)
Has not had a1c checked since stopping metformin 8 months ago. Checks sugars once a day. Glucometer shows avg 146 with a range of 124-180. Discussed decreasing sugary beverages and eating more vegetables.   - a1c today is 6.5. Discussed lifestyle modifications. She would like to stay off metformin if possible.  - recheck a1c in 3 months, if > 7.0 will need to start metformin

## 2018-03-12 LAB — VITAMIN D 25 HYDROXY (VIT D DEFICIENCY, FRACTURES): Vit D, 25-Hydroxy: 34.5 ng/mL (ref 30.0–100.0)

## 2018-03-12 MED ORDER — LORATADINE 10 MG PO TABS: 10.0000 mg | ORAL_TABLET | Freq: Every day | ORAL | 11 refills | Status: DC

## 2018-03-12 NOTE — Assessment & Plan Note (Signed)
Feels zyrtec isn't as effective as it used to be. Endorsing sinus congestion and ear pressure.   - Zyrtec switched to claritin

## 2018-03-12 NOTE — Assessment & Plan Note (Signed)
Smoking 6 cigarettes/day. Had reduced to 1-2/day with nicotine patches but she ran out of patches.   - refill nicotine patches

## 2018-03-12 NOTE — Assessment & Plan Note (Signed)
Reports another fall 4 days ago. She was standing on her tip toes, unlocking her door, when she tripped over a rug and fell on her left side. Denies LOC or head trauma. She heard a loud pop and felt pain in her left hip. She was able to get herself up and ambulate. Her low back and left hip pain have been improving with prn aleve and lidocaine patches. Exam reveals TTP over lumbar spine. She denies lower extremity weakness, saddle anesthesia, or urinary or bowel incontinence. On gait assessment, she walks bent over and states this is her usual gait. She recently attended a fall clinic with [facility] and they recommended Vit D level and DEXA scan. (risk factors include current smoker). She is not interessted in PT.   Plan: - tylenol and lidocaine cream for pain management, if pain does not continue to improve over the next 2 weeks, she was instructed to return to the clinic - Vit D level  - DEXA scan

## 2018-03-12 NOTE — Progress Notes (Signed)
I saw and evaluated the patient.  I personally confirmed the key portions of Dr. Cristal Generous history and exam and reviewed pertinent patient test results.  The assessment, diagnosis, and plan were formulated together and I agree with the documentation in the resident's note.

## 2018-03-12 NOTE — Addendum Note (Signed)
Addended by: Isabelle Course on: 03/12/2018 12:08 PM   Modules accepted: Orders

## 2018-03-19 ENCOUNTER — Encounter: Payer: Self-pay | Admitting: Internal Medicine

## 2018-03-19 ENCOUNTER — Other Ambulatory Visit: Payer: Self-pay | Admitting: Internal Medicine

## 2018-03-19 DIAGNOSIS — Z780 Asymptomatic menopausal state: Secondary | ICD-10-CM

## 2018-03-22 ENCOUNTER — Telehealth: Payer: Self-pay | Admitting: *Deleted

## 2018-03-22 NOTE — Telephone Encounter (Signed)
-----   Message from Isabelle Course, MD sent at 03/19/2018  2:23 PM EST ----- Tried calling patient but no answer. Please call her and let her know her Vit D level is normal. We are still working on getting her bone density scan scheduled. I had to reorder it under a different diagnosis code so her insurance would cover it.  Also, let her know that her hemoglobin a1c came back elevated. She stopped taking metformin 8 months ago. Her options are: 1) Start metformin again, or 2) Try modifying her diet and exercise and then recheck a1c at her next appointment in February.

## 2018-03-22 NOTE — Telephone Encounter (Signed)
Called pt per Dr Donne Hazel - informed Vit D is normal; new order for bone density has been placed  - stated she will call [facility]; and A1C is elevated - stated she prefers going back on Metformin instead of diet/exercise which she probably will not be able to do successfully.  Copy.

## 2018-03-26 ENCOUNTER — Other Ambulatory Visit: Payer: Self-pay | Admitting: Internal Medicine

## 2018-03-26 DIAGNOSIS — E2839 Other primary ovarian failure: Secondary | ICD-10-CM

## 2018-03-26 DIAGNOSIS — Z1231 Encounter for screening mammogram for malignant neoplasm of breast: Secondary | ICD-10-CM

## 2018-03-28 ENCOUNTER — Other Ambulatory Visit: Payer: Self-pay | Admitting: Internal Medicine

## 2018-03-28 DIAGNOSIS — Z794 Long term (current) use of insulin: Secondary | ICD-10-CM

## 2018-03-28 DIAGNOSIS — E118 Type 2 diabetes mellitus with unspecified complications: Secondary | ICD-10-CM

## 2018-03-28 MED ORDER — METFORMIN HCL 500 MG PO TABS: ORAL_TABLET | ORAL | 0 refills | Status: DC

## 2018-03-28 NOTE — Assessment & Plan Note (Signed)
Patient decided to start metformin because she feels she can't keep up with diet/exercise modifications. Will taper up metformin.   - Prescribe metformin 500mg  daily for 7 days, followed by 500mg  bid for 7 days, followed by 1,000mg  bid. - if tolerating well, can prescribed 1,000mg  tablets instead of 500mg 

## 2018-03-28 NOTE — Telephone Encounter (Signed)
I sent in a prescription for metformin 500mg  tablets with instructions to taper up the dose over the next 4 weeks. If she tolerates this well, I will prescribe 1,000mg  tablets instead of 500mg .

## 2018-03-29 NOTE — Telephone Encounter (Signed)
Pt called / informed of new Metformin rx  And taper instructions per Dr Donne Hazel.

## 2018-03-29 NOTE — Telephone Encounter (Signed)
Pt is requesting 90 day supply per pharmacy. Thanks

## 2018-04-15 ENCOUNTER — Encounter: Payer: Self-pay | Admitting: Podiatry

## 2018-04-15 ENCOUNTER — Ambulatory Visit (INDEPENDENT_AMBULATORY_CARE_PROVIDER_SITE_OTHER): Payer: Medicare Other | Admitting: Podiatry

## 2018-04-15 DIAGNOSIS — B351 Tinea unguium: Secondary | ICD-10-CM | POA: Diagnosis not present

## 2018-04-15 DIAGNOSIS — E1142 Type 2 diabetes mellitus with diabetic polyneuropathy: Secondary | ICD-10-CM | POA: Diagnosis not present

## 2018-04-15 NOTE — Progress Notes (Signed)
Subjective:  Patient ID: Rose [name], female    DOB: March 11, 1956,  MRN: [id number]  Chief Complaint  Patient presents with  . Debridement    Trim toenails    63 y.o. female presents  for diabetic foot care. Last AMBS was unkown. Sugars now only diet controlled. Reports numbness and tingling in their feet. Denies cramping in legs and thighs.  Review of Systems: Negative except as noted in the HPI. Denies N/V/F/Ch.  Past Medical History:  Diagnosis Date  . Anxiety   . Arthritis   . Asthma   . Chronic back pain   . Depression   . Diabetes mellitus   . Edema of lower extremity history cellulitis jan 2013  . Frequency of urination   . Hep C w/o coma, chronic ([place])   . History of chronic bronchitis   . History of drug abuse ([place]) states quit herion in 2006  . Hypertension   . Methadone maintenance therapy patient ([place])   . Nocturia   . Tendonitis   . Urge urinary incontinence     Current Outpatient Medications:  .  albuterol (PROVENTIL HFA;VENTOLIN HFA) 108 (90 Base) MCG/ACT inhaler, Inhale 2 puffs into the lungs every 6 (six) hours as needed. For wheezing, Disp: 1 Inhaler, Rfl: 2 .  aspirin EC 81 MG tablet, Take 1 tablet (81 mg total) by mouth daily., Disp: , Rfl:  .  atenolol (TENORMIN) 50 MG tablet, TAKE 1/2 TABLET(25 MG) BY MOUTH DAILY, Disp: 45 tablet, Rfl: 0 .  atorvastatin (LIPITOR) 40 MG tablet, Take 1 tablet (40 mg total) by mouth daily., Disp: 90 tablet, Rfl: 3 .  cetirizine (ZYRTEC) 10 MG tablet, , Disp: , Rfl:  .  citalopram (CELEXA) 20 MG tablet, Take 1 tablet (20 mg total) by mouth daily., Disp: 30 tablet, Rfl: 2 .  DULoxetine (CYMBALTA) 60 MG capsule, TAKE 1 CAPSULE(60 MG) BY MOUTH DAILY, Disp: 90 capsule, Rfl: 3 .  fluticasone (FLONASE) 50 MCG/ACT nasal spray, SHAKE LIQUID AND USE 1 SPRAY IN EACH NOSTRIL DAILY, Disp: 16 g, Rfl: 5 .  glucose blood (ACCU-CHEK AVIVA PLUS) test strip, Check blood sugar 3 times a day with accu chek aviva plus strips, Disp: 100  each, Rfl: 5 .  hydrochlorothiazide (HYDRODIURIL) 25 MG tablet, Take 1 tablet (25 mg total) by mouth daily., Disp: 90 tablet, Rfl: 1 .  Lancet Devices (ACCU-CHEK SOFTCLIX) lancets, Check blood sugar 3 times a day. Patient is insulin requiring, ICD 10 code E11.9., Disp: 100 each, Rfl: 12 .  Lancets 30G MISC, Check blood sugar 3 times a day, Disp: 100 each, Rfl: 5 .  lidocaine (XYLOCAINE) 5 % ointment, Apply 1 application topically as needed., Disp: 50 g, Rfl: 0 .  loratadine (CLARITIN) 10 MG tablet, Take 1 tablet (10 mg total) by mouth daily., Disp: 30 tablet, Rfl: 11 .  metFORMIN (GLUCOPHAGE) 500 MG tablet, SEE NOTES, Disp: 360 tablet, Rfl: 0 .  methadone (DOLOPHINE) 10 MG/5ML solution, Take 100 mg by mouth daily., Disp: , Rfl:  .  Multiple Vitamins-Minerals (ONE-A-DAY WOMENS 50+ ADVANTAGE) TABS, Take by mouth daily., Disp: , Rfl:  .  naproxen sodium (ALEVE) 220 MG tablet, Take 220 mg by mouth as needed., Disp: , Rfl:  .  nicotine (NICODERM CQ - DOSED IN MG/24 HOURS) 14 mg/24hr patch, Medicaid [id number] L Wks 1-6: 14 mg x 1 patch daily. Wear for 24 hours. If you have sleep disturbances, remove at bedtime., Disp: 42 patch, Rfl: 0 .  tiZANidine (ZANAFLEX) 4 MG tablet, TAKE  1 TABLET(4 MG) BY MOUTH THREE TIMES DAILY, Disp: 90 tablet, Rfl: 0 .  traZODone (DESYREL) 100 MG tablet, Take 100 mg by mouth at bedtime., Disp: , Rfl: 2  Social History   Tobacco Use  Smoking Status Current Every Day Smoker  . Packs/day: 0.25  . Years: 20.00  . Pack years: 5.00  . Types: Cigarettes  Smokeless Tobacco Never Used  Tobacco Comment   6   cigs per day    Allergies  Allergen Reactions  . Lisinopril Swelling    Angioedema  . Gabapentin Hives and Other (See Comments)    Only when takes over 300 mg dose. "gives me the shakes"  . Hydrocodone-Acetaminophen Hives    Other reaction(s): GI Upset (intolerance)  . Hydrocodone-Acetaminophen Other (See Comments) and Hives  . Vicodin [Hydrocodone-Acetaminophen]  Hives, Diarrhea and Other (See Comments)    Stomach  cramps   Objective:  There were no vitals filed for this visit. There is no height or weight on file to calculate BMI. Constitutional Well developed. Well nourished.  Vascular Dorsalis pedis pulses present 1+ bilaterally  Posterior tibial pulses present 1+ bilaterally  Pedal hair growth diminished. Capillary refill normal to all digits.  No cyanosis or clubbing noted.  Neurologic Normal speech. Oriented to person, place, and time. Epicritic sensation to light touch grossly present bilaterally. Protective sensation with 5.07 monofilament  absent bilaterally.  Dermatologic Nails elongated, thickened, dystrophic. No open wounds. HPK L 5th MPJ, R 1st MPJ  Orthopedic: Normal joint ROM without pain or crepitus bilaterally. No visible deformities. No bony tenderness.   Assessment:   1. DM type 2 with diabetic peripheral neuropathy ([place])   2. Onychomycosis    Plan:  Patient was evaluated and treated and all questions answered.  Diabetes with DPN, Onychomycosis -Educated on diabetic footcare. Diabetic risk level 1 -Nails x10 debrided sharply and manually with large nail nipper and rotary burr.   Procedure: Nail Debridement Rationale: Patient meets criteria for routine foot care due to DPN Type of Debridement: manual, sharp debridement. Instrumentation: Nail nipper, rotary burr. Number of Nails: 10      No follow-ups on file.

## 2018-05-12 ENCOUNTER — Other Ambulatory Visit: Payer: Self-pay

## 2018-05-12 ENCOUNTER — Other Ambulatory Visit (HOSPITAL_COMMUNITY)
Admission: RE | Admit: 2018-05-12 | Discharge: 2018-05-12 | Disposition: A | Discharge status: Home / Self Care | Payer: Medicare Other | Source: Ambulatory Visit | Attending: Internal Medicine | Admitting: Internal Medicine

## 2018-05-12 ENCOUNTER — Ambulatory Visit (INDEPENDENT_AMBULATORY_CARE_PROVIDER_SITE_OTHER): Payer: Medicare Other | Admitting: Internal Medicine

## 2018-05-12 ENCOUNTER — Encounter: Payer: Self-pay | Admitting: Internal Medicine

## 2018-05-12 VITALS — BP 136/63 | HR 74 | Temp 98.2°F | Ht 62.0 in | Wt 173.2 lb

## 2018-05-12 DIAGNOSIS — G8929 Other chronic pain: Secondary | ICD-10-CM | POA: Diagnosis not present

## 2018-05-12 DIAGNOSIS — M545 Low back pain: Secondary | ICD-10-CM | POA: Diagnosis not present

## 2018-05-12 DIAGNOSIS — E118 Type 2 diabetes mellitus with unspecified complications: Secondary | ICD-10-CM

## 2018-05-12 DIAGNOSIS — B977 Papillomavirus as the cause of diseases classified elsewhere: Secondary | ICD-10-CM | POA: Insufficient documentation

## 2018-05-12 DIAGNOSIS — Z124 Encounter for screening for malignant neoplasm of cervix: Secondary | ICD-10-CM

## 2018-05-12 DIAGNOSIS — I10 Essential (primary) hypertension: Secondary | ICD-10-CM | POA: Diagnosis not present

## 2018-05-12 DIAGNOSIS — Z794 Long term (current) use of insulin: Secondary | ICD-10-CM

## 2018-05-12 DIAGNOSIS — Z8619 Personal history of other infectious and parasitic diseases: Secondary | ICD-10-CM

## 2018-05-12 LAB — GLUCOSE, CAPILLARY: Glucose-Capillary: 141 mg/dL — ABNORMAL HIGH (ref 70–99)

## 2018-05-12 MED ORDER — DICLOFENAC SODIUM 1 % TD GEL: 2.0000 g | Freq: Four times a day (QID) | TRANSDERMAL | 5 refills | Status: DC
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
## 2018-05-12 NOTE — Assessment & Plan Note (Addendum)
a1c 6.5 in Dec 2019. Had previously stopped diabetes medications. Metformin was restarted in Dec and she reports taking 500mg  qd. Denies symptomatic hypoglycemia (dizziness, diaphoresis, blurred vision, shakiness). Does not check her sugars at home. Denies polydipsia or polyuria.   Plan: a1c is at goal - continue metformin 500mg  qd  - repeat a1c in 4 months

## 2018-05-12 NOTE — Assessment & Plan Note (Addendum)
BP Readings from Last 3 Encounters:  05/12/18 136/63  03/11/18 (!) 159/74  12/07/17 139/67   BP at goal today. Reports compliance with HCTZ 25mg  qd and atenolol 50mg  daily. Denies chest pain, sob, blurry vision, dizziness. Renal function in July 2019 showed stable CKD3a. Given information on DASH diet and encouraged exercise.   Plan: - continue HCTZ 25mg  qd and atenolol 50mg  qd

## 2018-05-12 NOTE — Patient Instructions (Addendum)
It was nice seeing you today. Thank you for choosing [facility] for your Primary Care.   Today we talked about:  1) Pap smear: I will call you with the results  2) Diabetes and High blood pressure: Continue taking metformin 500mg  daily. Please review the DASH diet below and see what changes you can make 3) Back/Hip pain: I have sent some voltaren gel to your pharmacy and ordered an MRI of your back. I will call you with these results and figure out if you could see another doctor for an injection.   FOLLOW-UP INSTRUCTIONS When: 5 months For: Diabetes A1c check  What to bring: all medications  Please contact the clinic if you have any problems, or need to be seen sooner.    Pap Test Why am I having this test? A Pap test, also called a Pap smear, is a screening test to check for signs of:  Cancer of the vagina, cervix, and uterus. The cervix is the lower part of the uterus that opens into the vagina.  Infection.  Changes that may be a sign that cancer is developing (precancerous changes). Women need this test on a regular basis. In general, you should have a Pap test every 3 years until you reach menopause or age 71. Women aged 30-60 may choose to have their Pap test done at the same time as an HPV (human papillomavirus) test every 5 years (instead of every 3 years). Your health care provider may recommend having Pap tests more or less often depending on your medical conditions and past Pap test results. What kind of sample is taken?  Your health care provider will collect a sample of cells from the surface of your cervix. This will be done using a small cotton swab, plastic spatula, or brush. This sample is often collected during a pelvic exam, when you are lying on your back on an exam table with feet in footrests (stirrups). In some cases, fluids (secretions) from the cervix or vagina may also be collected. How do I prepare for this test?  Be aware of where you are in  your menstrual cycle. If you are menstruating on the day of the test, you may be asked to reschedule.  You may need to reschedule if you have a known vaginal infection on the day of the test.  Follow instructions from your health care provider about: ? Changing or stopping your regular medicines. Some medicines can cause abnormal test results, such as digitalis and tetracycline. ? Avoiding douching or taking a bath the day before or the day of the test. Tell a health care provider about:  Any allergies you have.  All medicines you are taking, including vitamins, herbs, eye drops, creams, and over-the-counter medicines.  Any blood disorders you have.  Any surgeries you have had.  Any medical conditions you have.  Whether you are pregnant or may be pregnant. How are the results reported? Your test results will be reported as either abnormal or normal. A false-positive result can occur. A false positive is incorrect because it means that a condition is present when it is not. A false-negative result can occur. A false negative is incorrect because it means that a condition is not present when it is. What do the results mean? A normal test result means that you do not have signs of cancer of the vagina, cervix, or uterus. An abnormal result may mean that you have:  Cancer. A Pap test by itself is not  enough to diagnose cancer. You will have more tests done in this case.  Precancerous changes in your vagina, cervix, or uterus.  Inflammation of the cervix.  An STD (sexually transmitted disease).  A fungal infection.  A parasite infection. Talk with your health care provider about what your results mean. Questions to ask your health care provider Ask your health care provider, or the department that is doing the test:  When will my results be ready?  How will I get my results?  What are my treatment options?  What other tests do I need?  What are my next  steps? Summary  In general, women should have a Pap test every 3 years until they reach menopause or age 53.  Your health care provider will collect a sample of cells from the surface of your cervix. This will be done using a small cotton swab, plastic spatula, or brush.  In some cases, fluids (secretions) from the cervix or vagina may also be collected. This information is not intended to replace advice given to you by your health care provider. Make sure you discuss any questions you have with your health care provider. Document Released: 06/07/2002 Document Revised: 11/24/2016 Document Reviewed: 11/24/2016 Elsevier Interactive Patient Education  2019 [place] DASH stands for "Dietary Approaches to Stop Hypertension." The DASH eating plan is a healthy eating plan that has been shown to reduce high blood pressure (hypertension). It may also reduce your risk for type 2 diabetes, heart disease, and stroke. The DASH eating plan may also help with weight loss. What are tips for following this plan?  General guidelines  Avoid eating more than 2,300 mg (milligrams) of salt (sodium) a day. If you have hypertension, you may need to reduce your sodium intake to 1,500 mg a day.  Limit alcohol intake to no more than 1 drink a day for nonpregnant women and 2 drinks a day for men. One drink equals 12 oz of beer, 5 oz of wine, or 1 oz of hard liquor.  Work with your health care provider to maintain a healthy body weight or to lose weight. Ask what an ideal weight is for you.  Get at least 30 minutes of exercise that causes your heart to beat faster (aerobic exercise) most days of the week. Activities may include walking, swimming, or biking.  Work with your health care provider or diet and nutrition specialist (dietitian) to adjust your eating plan to your individual calorie needs. Reading food labels   Check food labels for the amount of sodium per serving. Choose foods  with less than 5 percent of the Daily Value of sodium. Generally, foods with less than 300 mg of sodium per serving fit into this eating plan.  To find whole grains, look for the word "whole" as the first word in the ingredient list. Shopping  Buy products labeled as "low-sodium" or "no salt added."  Buy fresh foods. Avoid canned foods and premade or frozen meals. Cooking  Avoid adding salt when cooking. Use salt-free seasonings or herbs instead of table salt or sea salt. Check with your health care provider or pharmacist before using salt substitutes.  Do not fry foods. Cook foods using healthy methods such as baking, boiling, grilling, and broiling instead.  Cook with heart-healthy oils, such as olive, canola, soybean, or sunflower oil. Meal planning  Eat a balanced diet that includes: ? 5 or more servings of fruits and vegetables each day. At each meal,  try to fill half of your plate with fruits and vegetables. ? Up to 6-8 servings of whole grains each day. ? Less than 6 oz of lean meat, poultry, or fish each day. A 3-oz serving of meat is about the same size as a deck of cards. One egg equals 1 oz. ? 2 servings of low-fat dairy each day. ? A serving of nuts, seeds, or beans 5 times each week. ? Heart-healthy fats. Healthy fats called Omega-3 fatty acids are found in foods such as flaxseeds and coldwater fish, like sardines, salmon, and mackerel.  Limit how much you eat of the following: ? Canned or prepackaged foods. ? Food that is high in trans fat, such as fried foods. ? Food that is high in saturated fat, such as fatty meat. ? Sweets, desserts, sugary drinks, and other foods with added sugar. ? Full-fat dairy products.  Do not salt foods before eating.  Try to eat at least 2 vegetarian meals each week.  Eat more home-cooked food and less restaurant, buffet, and fast food.  When eating at a restaurant, ask that your food be prepared with less salt or no salt, if  possible. What foods are recommended? The items listed may not be a complete list. Talk with your dietitian about what dietary choices are best for you. Grains Whole-grain or whole-wheat bread. Whole-grain or whole-wheat pasta. Brown rice. [place]. Bulgur. Whole-grain and low-sodium cereals. Pita bread. Low-fat, low-sodium crackers. Whole-wheat flour tortillas. Vegetables Fresh or frozen vegetables (raw, steamed, roasted, or grilled). Low-sodium or reduced-sodium tomato and vegetable juice. Low-sodium or reduced-sodium tomato sauce and tomato paste. Low-sodium or reduced-sodium canned vegetables. Fruits All fresh, dried, or frozen fruit. Canned fruit in natural juice (without added sugar). Meat and other protein foods Skinless chicken or [place]. Ground chicken or [place]. Pork with fat trimmed off. Fish and seafood. Egg whites. Dried beans, peas, or lentils. Unsalted nuts, nut butters, and seeds. Unsalted canned beans. Lean cuts of beef with fat trimmed off. Low-sodium, lean deli meat. Dairy Low-fat (1%) or fat-free (skim) milk. Fat-free, low-fat, or reduced-fat cheeses. Nonfat, low-sodium ricotta or cottage cheese. Low-fat or nonfat yogurt. Low-fat, low-sodium cheese. Fats and oils Soft margarine without trans fats. Vegetable oil. Low-fat, reduced-fat, or light mayonnaise and salad dressings (reduced-sodium). Canola, safflower, olive, soybean, and sunflower oils. Avocado. Seasoning and other foods Herbs. Spices. Seasoning mixes without salt. Unsalted popcorn and pretzels. Fat-free sweets. What foods are not recommended? The items listed may not be a complete list. Talk with your dietitian about what dietary choices are best for you. Grains Baked goods made with fat, such as croissants, muffins, or some breads. Dry pasta or rice meal packs. Vegetables Creamed or fried vegetables. Vegetables in a cheese sauce. Regular canned vegetables (not low-sodium or reduced-sodium). Regular canned  tomato sauce and paste (not low-sodium or reduced-sodium). Regular tomato and vegetable juice (not low-sodium or reduced-sodium). Angie Fava. Olives. Fruits Canned fruit in a light or heavy syrup. Fried fruit. Fruit in cream or butter sauce. Meat and other protein foods Fatty cuts of meat. Ribs. Fried meat. Berniece Salines. Sausage. Bologna and other processed lunch meats. Salami. Fatback. Hotdogs. Bratwurst. Salted nuts and seeds. Canned beans with added salt. Canned or smoked fish. Whole eggs or egg yolks. Chicken or [place] with skin. Dairy Whole or 2% milk, cream, and half-and-half. Whole or full-fat cream cheese. Whole-fat or sweetened yogurt. Full-fat cheese. Nondairy creamers. Whipped toppings. Processed cheese and cheese spreads. Fats and oils Butter. Stick margarine. Lard. Shortening. Ghee.  Bacon fat. Tropical oils, such as coconut, palm kernel, or palm oil. Seasoning and other foods Salted popcorn and pretzels. Onion salt, garlic salt, seasoned salt, table salt, and sea salt. Worcestershire sauce. Tartar sauce. Barbecue sauce. Teriyaki sauce. Soy sauce, including reduced-sodium. Steak sauce. Canned and packaged gravies. Fish sauce. Oyster sauce. Cocktail sauce. Horseradish that you find on the shelf. Ketchup. Mustard. Meat flavorings and tenderizers. Bouillon cubes. Hot sauce and Tabasco sauce. Premade or packaged marinades. Premade or packaged taco seasonings. Relishes. Regular salad dressings. Where to find more information:  National Heart, Lung, and [place]: https://wilson-eaton.com/  American Heart Association: www.heart.org Summary  The DASH eating plan is a healthy eating plan that has been shown to reduce high blood pressure (hypertension). It may also reduce your risk for type 2 diabetes, heart disease, and stroke.  With the DASH eating plan, you should limit salt (sodium) intake to 2,300 mg a day. If you have hypertension, you may need to reduce your sodium intake to 1,500 mg a day.  When  on the DASH eating plan, aim to eat more fresh fruits and vegetables, whole grains, lean proteins, low-fat dairy, and heart-healthy fats.  Work with your health care provider or diet and nutrition specialist (dietitian) to adjust your eating plan to your individual calorie needs. This information is not intended to replace advice given to you by your health care provider. Make sure you discuss any questions you have with your health care provider. Document Released: 03/06/2011 Document Revised: 03/10/2016 Document Reviewed: 03/10/2016 Elsevier Interactive Patient Education  2019 [employer].

## 2018-05-12 NOTE — Assessment & Plan Note (Addendum)
She has a history of HPV+, normal cytology pap smear in March 2018 and has declined follow up. She denies vaginal bleeding, discharge, or irritation. She does not have menstrual cycles. Some erythematous changes around the cervical os but no CTM or friable tissue  Plan: - pap smear today, will call her with results

## 2018-05-13 NOTE — Addendum Note (Signed)
Addended by: Oval Linsey D on: 05/13/2018 09:49 AM   Modules accepted: Level of Service

## 2018-05-13 NOTE — Progress Notes (Signed)
Patient ID: Rose [name], female   DOB: 01/08/1956, 63 y.o.   MRN: [id number]  Case discussed with Dr. Donne Hazel at the time of the visit. We reviewed the resident's history and exam and pertinent patient test results. I agree with the assessment, diagnosis, and plan of care documented in the resident's note.

## 2018-05-14 DIAGNOSIS — H2513 Age-related nuclear cataract, bilateral: Secondary | ICD-10-CM | POA: Diagnosis not present

## 2018-05-14 DIAGNOSIS — H25013 Cortical age-related cataract, bilateral: Secondary | ICD-10-CM | POA: Diagnosis not present

## 2018-05-14 DIAGNOSIS — H35033 Hypertensive retinopathy, bilateral: Secondary | ICD-10-CM | POA: Diagnosis not present

## 2018-05-14 DIAGNOSIS — E119 Type 2 diabetes mellitus without complications: Secondary | ICD-10-CM | POA: Diagnosis not present

## 2018-05-14 LAB — HM DIABETES EYE EXAM

## 2018-05-17 ENCOUNTER — Encounter: Payer: Self-pay | Admitting: *Deleted

## 2018-05-17 LAB — CYTOLOGY - PAP
Diagnosis: NEGATIVE
HPV: NOT DETECTED

## 2018-05-21 ENCOUNTER — Encounter (HOSPITAL_COMMUNITY): Payer: Self-pay

## 2018-05-21 ENCOUNTER — Ambulatory Visit (HOSPITAL_COMMUNITY)
Admission: RE | Admit: 2018-05-21 | Discharge: 2018-05-21 | Disposition: A | Discharge status: Home / Self Care | Payer: Medicare Other | Source: Ambulatory Visit | Attending: Internal Medicine | Admitting: Internal Medicine

## 2018-05-21 ENCOUNTER — Ambulatory Visit: Payer: Medicare Other

## 2018-05-21 ENCOUNTER — Other Ambulatory Visit: Payer: Medicare Other

## 2018-05-21 DIAGNOSIS — G8929 Other chronic pain: Secondary | ICD-10-CM

## 2018-05-21 DIAGNOSIS — M545 Low back pain, unspecified: Secondary | ICD-10-CM

## 2018-05-25 ENCOUNTER — Other Ambulatory Visit: Payer: Self-pay | Admitting: Internal Medicine

## 2018-05-25 DIAGNOSIS — M62838 Other muscle spasm: Secondary | ICD-10-CM

## 2018-05-27 ENCOUNTER — Other Ambulatory Visit: Payer: Self-pay | Admitting: Internal Medicine

## 2018-05-27 NOTE — Telephone Encounter (Signed)
Pt called / informed med was refilled yesterday, sent to [facility]  And to call the pharmacy.

## 2018-05-27 NOTE — Telephone Encounter (Signed)
Needs refills on tiZANidine (ZANAFLEX) 4 MG tablet [id number] - [place], [place] - [address] ; pt contact [phone]   Pt also said she missed a call and accidentally deleted the message, pls contact [phone]

## 2018-05-28 ENCOUNTER — Ambulatory Visit (HOSPITAL_COMMUNITY): Payer: Medicare Other

## 2018-06-01 ENCOUNTER — Other Ambulatory Visit: Payer: Self-pay

## 2018-06-01 ENCOUNTER — Ambulatory Visit (INDEPENDENT_AMBULATORY_CARE_PROVIDER_SITE_OTHER): Payer: Medicare Other | Admitting: Internal Medicine

## 2018-06-01 ENCOUNTER — Encounter: Payer: Self-pay | Admitting: Internal Medicine

## 2018-06-01 DIAGNOSIS — M5441 Lumbago with sciatica, right side: Secondary | ICD-10-CM

## 2018-06-01 DIAGNOSIS — G8929 Other chronic pain: Secondary | ICD-10-CM

## 2018-06-01 MED ORDER — DICLOFENAC SODIUM 1 % TD GEL: 2.0000 g | Freq: Four times a day (QID) | TRANSDERMAL | 1 refills | Status: DC

## 2018-06-01 MED ORDER — KETOROLAC TROMETHAMINE 30 MG/ML IJ SOLN
30.0000 mg | Freq: Once | INTRAMUSCULAR | Status: AC
  Administered 2018-06-01: 30 mg via INTRAMUSCULAR
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
## 2018-06-01 NOTE — Progress Notes (Signed)
   CC: Chronic right sided back pain  HPI:  [name] is a 63 y.o. female with history noted below that presents to the acute care clinic for follow-up on chronic right-sided back pain. Please see problem based charting for the status of patient's chronic medical conditions.  Past Medical History:  Diagnosis Date  . Anxiety   . Arthritis   . Asthma   . Chronic back pain   . Depression   . Diabetes mellitus   . Edema of lower extremity history cellulitis jan 2013  . Frequency of urination   . Hep C w/o coma, chronic ([place])   . History of chronic bronchitis   . History of drug abuse ([place]) states quit herion in 2006  . Hypertension   . Methadone maintenance therapy patient ([place])   . Nocturia   . Tendonitis   . Urge urinary incontinence     Review of Systems:  Review of Systems  Constitutional: Negative for chills and fever.  Cardiovascular: Negative for leg swelling.  Genitourinary: Negative for urgency.  Musculoskeletal: Positive for back pain. Negative for falls.  Neurological: Negative for tingling, focal weakness and weakness.     Physical Exam:  Vitals:   06/01/18 0853  BP: (!) 126/51  Pulse: 74  Temp: 98.3 F (36.8 C)  TempSrc: Oral  SpO2: 99%  Weight: 169 lb 14.4 oz (77.1 kg)  Height: 5\' 2"  (1.575 m)   Physical Exam  Constitutional: She is well-developed, well-nourished, and in no distress.  Cardiovascular: Normal rate, regular rhythm and normal heart sounds. Exam reveals no gallop and no friction rub.  No murmur heard. Pulmonary/Chest: Effort normal and breath sounds normal. No respiratory distress. She has no wheezes. She has no rales.  Musculoskeletal:     Comments: 5/5 motor strength in lower extremities bilaterally Abnormal gait, patient would hunch over while walking No point tenderness over spine Tenderness to palpation over right hip  Skin: Skin is warm and dry.     Assessment & Plan:   See encounters tab for problem based medical  decision making.   Patient discussed with Dr. Eppie Gibson

## 2018-06-01 NOTE — Progress Notes (Signed)
Case discussed with Dr. Ratliff-Hoffman at the time of the visit. We reviewed the resident's history and exam and pertinent patient test results. I agree with the assessment, diagnosis, and plan of care documented in the resident's note. 

## 2018-06-01 NOTE — Patient Instructions (Signed)
Rose [name],  It was a pleasure meeting you today. Voltaren gel has been sent to your pharmacy. A CT scan has been ordered to better assess your back.  Please return to the clinic for any acute medical needs.

## 2018-06-15 ENCOUNTER — Ambulatory Visit
Admission: RE | Admit: 2018-06-15 | Discharge: 2018-06-15 | Disposition: A | Discharge status: Home / Self Care | Payer: Medicare Other | Source: Ambulatory Visit | Attending: *Deleted | Admitting: *Deleted

## 2018-06-15 ENCOUNTER — Ambulatory Visit
Admission: RE | Admit: 2018-06-15 | Discharge: 2018-06-15 | Disposition: A | Discharge status: Home / Self Care | Payer: Medicare Other | Source: Ambulatory Visit | Attending: Internal Medicine | Admitting: Internal Medicine

## 2018-06-15 ENCOUNTER — Other Ambulatory Visit: Payer: Self-pay

## 2018-06-15 DIAGNOSIS — Z1231 Encounter for screening mammogram for malignant neoplasm of breast: Secondary | ICD-10-CM | POA: Diagnosis not present

## 2018-06-15 DIAGNOSIS — Z13820 Encounter for screening for osteoporosis: Secondary | ICD-10-CM | POA: Diagnosis not present

## 2018-06-15 DIAGNOSIS — E2839 Other primary ovarian failure: Secondary | ICD-10-CM

## 2018-06-15 DIAGNOSIS — Z780 Asymptomatic menopausal state: Secondary | ICD-10-CM | POA: Diagnosis not present

## 2018-06-16 ENCOUNTER — Ambulatory Visit (HOSPITAL_COMMUNITY): Admission: RE | Admit: 2018-06-16 | Payer: Medicare Other | Source: Ambulatory Visit

## 2018-06-16 ENCOUNTER — Encounter (HOSPITAL_COMMUNITY): Payer: Self-pay

## 2018-06-21 ENCOUNTER — Other Ambulatory Visit: Payer: Self-pay | Admitting: Internal Medicine

## 2018-06-22 ENCOUNTER — Other Ambulatory Visit: Payer: Self-pay | Admitting: Internal Medicine

## 2018-06-22 ENCOUNTER — Other Ambulatory Visit (HOSPITAL_COMMUNITY): Payer: Self-pay | Admitting: Internal Medicine

## 2018-06-22 DIAGNOSIS — M542 Cervicalgia: Secondary | ICD-10-CM

## 2018-07-08 ENCOUNTER — Other Ambulatory Visit (HOSPITAL_COMMUNITY): Payer: Self-pay | Admitting: Internal Medicine

## 2018-07-08 DIAGNOSIS — G8929 Other chronic pain: Secondary | ICD-10-CM

## 2018-07-08 DIAGNOSIS — M5441 Lumbago with sciatica, right side: Principal | ICD-10-CM

## 2018-07-15 ENCOUNTER — Ambulatory Visit (INDEPENDENT_AMBULATORY_CARE_PROVIDER_SITE_OTHER): Payer: Medicare Other | Admitting: Podiatry

## 2018-07-15 ENCOUNTER — Other Ambulatory Visit: Payer: Self-pay

## 2018-07-15 VITALS — Temp 97.5°F

## 2018-07-15 DIAGNOSIS — B351 Tinea unguium: Secondary | ICD-10-CM | POA: Diagnosis not present

## 2018-07-15 DIAGNOSIS — E1151 Type 2 diabetes mellitus with diabetic peripheral angiopathy without gangrene: Secondary | ICD-10-CM

## 2018-07-15 DIAGNOSIS — E1169 Type 2 diabetes mellitus with other specified complication: Secondary | ICD-10-CM

## 2018-07-15 NOTE — Progress Notes (Signed)
Subjective:  Patient ID: Rose [name], female    DOB: 1956-03-11,  MRN: [id number]  Chief Complaint  Patient presents with  . Nail Problem    Debride thick nails, Lt hallux nail" this nail is very painful"     63 y.o. female presents  for diabetic foot care. Hx as above.  Review of Systems: Negative except as noted in the HPI. Denies N/V/F/Ch.  Past Medical History:  Diagnosis Date  . Anxiety   . Arthritis   . Asthma   . Chronic back pain   . Depression   . Diabetes mellitus   . Edema of lower extremity history cellulitis jan 2013  . Frequency of urination   . Hep C w/o coma, chronic ([place])   . History of chronic bronchitis   . History of drug abuse ([place]) states quit herion in 2006  . Hypertension   . Methadone maintenance therapy patient ([place])   . Nocturia   . Tendonitis   . Urge urinary incontinence     Current Outpatient Medications:  .  albuterol (PROVENTIL HFA;VENTOLIN HFA) 108 (90 Base) MCG/ACT inhaler, Inhale 2 puffs into the lungs every 6 (six) hours as needed. For wheezing, Disp: 1 Inhaler, Rfl: 2 .  aspirin EC 81 MG tablet, Take 1 tablet (81 mg total) by mouth daily., Disp: , Rfl:  .  atenolol (TENORMIN) 50 MG tablet, TAKE 1/2 TABLET(25 MG) BY MOUTH DAILY, Disp: 45 tablet, Rfl: 0 .  atorvastatin (LIPITOR) 40 MG tablet, Take 1 tablet (40 mg total) by mouth daily., Disp: 90 tablet, Rfl: 3 .  cetirizine (ZYRTEC) 10 MG tablet, , Disp: , Rfl:  .  citalopram (CELEXA) 20 MG tablet, Take 1 tablet (20 mg total) by mouth daily., Disp: 30 tablet, Rfl: 2 .  diclofenac sodium (VOLTAREN) 1 % GEL, Apply 2 g topically 4 (four) times daily., Disp: 1 Tube, Rfl: 1 .  DULoxetine (CYMBALTA) 60 MG capsule, TAKE 1 CAPSULE(60 MG) BY MOUTH DAILY, Disp: 90 capsule, Rfl: 3 .  fluticasone (FLONASE) 50 MCG/ACT nasal spray, SHAKE LIQUID AND USE 1 SPRAY IN EACH NOSTRIL DAILY, Disp: 16 g, Rfl: 5 .  glucose blood (ACCU-CHEK AVIVA PLUS) test strip, Check blood sugar 3 times a day with accu chek  aviva plus strips, Disp: 100 each, Rfl: 5 .  hydrochlorothiazide (HYDRODIURIL) 25 MG tablet, Take 1 tablet (25 mg total) by mouth daily., Disp: 90 tablet, Rfl: 1 .  Lancet Devices (ACCU-CHEK SOFTCLIX) lancets, Check blood sugar 3 times a day. Patient is insulin requiring, ICD 10 code E11.9., Disp: 100 each, Rfl: 12 .  Lancets 30G MISC, Check blood sugar 3 times a day, Disp: 100 each, Rfl: 5 .  lidocaine (XYLOCAINE) 5 % ointment, Apply 1 application topically as needed., Disp: 50 g, Rfl: 0 .  loratadine (CLARITIN) 10 MG tablet, Take 1 tablet (10 mg total) by mouth daily., Disp: 30 tablet, Rfl: 11 .  metFORMIN (GLUCOPHAGE) 500 MG tablet, SEE NOTES, Disp: 360 tablet, Rfl: 0 .  methadone (DOLOPHINE) 10 MG/5ML solution, Take 100 mg by mouth daily., Disp: , Rfl:  .  Multiple Vitamins-Minerals (ONE-A-DAY WOMENS 50+ ADVANTAGE) TABS, Take by mouth daily., Disp: , Rfl:  .  naproxen sodium (ALEVE) 220 MG tablet, Take 220 mg by mouth as needed., Disp: , Rfl:  .  nicotine (NICODERM CQ - DOSED IN MG/24 HOURS) 14 mg/24hr patch, Medicaid [id number] L Wks 1-6: 14 mg x 1 patch daily. Wear for 24 hours. If you have sleep disturbances, remove at bedtime.,  Disp: 42 patch, Rfl: 0 .  tiZANidine (ZANAFLEX) 4 MG tablet, TAKE 1 TABLET(4 MG) BY MOUTH THREE TIMES DAILY, Disp: 90 tablet, Rfl: 0 .  traZODone (DESYREL) 100 MG tablet, Take 100 mg by mouth at bedtime., Disp: , Rfl: 2  Social History   Tobacco Use  Smoking Status Current Every Day Smoker  . Packs/day: 0.50  . Years: 20.00  . Pack years: 10.00  . Types: Cigarettes  Smokeless Tobacco Never Used    Allergies  Allergen Reactions  . Lisinopril Swelling    Angioedema  . Gabapentin Hives and Other (See Comments)    Only when takes over 300 mg dose. "gives me the shakes"  . Hydrocodone-Acetaminophen Hives    Other reaction(s): GI Upset (intolerance)  . Hydrocodone-Acetaminophen Other (See Comments) and Hives  . Vicodin [Hydrocodone-Acetaminophen] Hives,  Diarrhea and Other (See Comments)    Stomach  cramps   Objective:   Vitals:   07/15/18 0946  Temp: (!) 97.5 F (36.4 C)   There is no height or weight on file to calculate BMI. Constitutional Well developed. Well nourished.  Vascular Dorsalis pedis pulses present 1+ bilaterally  Posterior tibial pulses present 1+ bilaterally  Pedal hair growth diminished. Capillary refill normal to all digits.  No cyanosis or clubbing noted.  Neurologic Normal speech. Oriented to person, place, and time. Epicritic sensation to light touch grossly present bilaterally. Protective sensation with 5.07 monofilament  absent bilaterally.  Dermatologic Nails elongated, thickened, dystrophic. No open wounds. HPK L 5th MPJ, R 1st MPJ  Orthopedic: Normal joint ROM without pain or crepitus bilaterally. No visible deformities. No bony tenderness.   Assessment:   1. Onychomycosis of multiple toenails with type 2 diabetes mellitus and peripheral angiopathy ([place])    Plan:  Patient was evaluated and treated and all questions answered.  Diabetes with DPN, Onychomycosis -Routine foot care as below  Procedure: Nail Debridement Rationale: Patient meets criteria for routine foot care due to DM/DPN Type of Debridement: manual, sharp debridement. Instrumentation: Nail nipper, rotary burr. Number of Nails: 10        No follow-ups on file.

## 2018-07-26 ENCOUNTER — Ambulatory Visit (HOSPITAL_COMMUNITY): Admission: RE | Admit: 2018-07-26 | Payer: Medicare Other | Source: Ambulatory Visit

## 2018-07-26 ENCOUNTER — Ambulatory Visit (HOSPITAL_COMMUNITY): Payer: Medicare Other

## 2018-07-29 ENCOUNTER — Ambulatory Visit (HOSPITAL_COMMUNITY): Payer: Medicare Other

## 2018-08-03 ENCOUNTER — Other Ambulatory Visit: Payer: Self-pay | Admitting: Internal Medicine

## 2018-08-03 DIAGNOSIS — I5032 Chronic diastolic (congestive) heart failure: Secondary | ICD-10-CM

## 2018-08-03 DIAGNOSIS — I10 Essential (primary) hypertension: Secondary | ICD-10-CM

## 2018-08-03 DIAGNOSIS — M62838 Other muscle spasm: Secondary | ICD-10-CM

## 2018-08-03 MED ORDER — GLUCOSE BLOOD VI STRP: ORAL_STRIP | 5 refills | Status: DC

## 2018-08-06 ENCOUNTER — Other Ambulatory Visit: Payer: Self-pay | Admitting: Internal Medicine

## 2018-08-09 NOTE — Addendum Note (Signed)
Addended by: Isabelle Course on: 08/09/2018 04:08 PM   Modules accepted: Orders

## 2018-08-25 ENCOUNTER — Other Ambulatory Visit: Payer: Self-pay | Admitting: Student

## 2018-08-25 ENCOUNTER — Other Ambulatory Visit (HOSPITAL_COMMUNITY): Payer: Self-pay | Admitting: Internal Medicine

## 2018-08-25 DIAGNOSIS — G8929 Other chronic pain: Secondary | ICD-10-CM

## 2018-08-26 ENCOUNTER — Other Ambulatory Visit (HOSPITAL_COMMUNITY): Payer: Self-pay | Admitting: Internal Medicine

## 2018-08-26 DIAGNOSIS — G8929 Other chronic pain: Secondary | ICD-10-CM

## 2018-08-26 DIAGNOSIS — M545 Low back pain, unspecified: Secondary | ICD-10-CM

## 2018-08-26 DIAGNOSIS — M79604 Pain in right leg: Secondary | ICD-10-CM

## 2018-08-30 ENCOUNTER — Ambulatory Visit (HOSPITAL_COMMUNITY)
Admission: RE | Admit: 2018-08-30 | Discharge: 2018-08-30 | Disposition: A | Discharge status: Home / Self Care | Payer: Medicare Other | Source: Ambulatory Visit | Attending: Internal Medicine | Admitting: Internal Medicine

## 2018-08-30 ENCOUNTER — Telehealth: Payer: Self-pay | Admitting: *Deleted

## 2018-08-30 ENCOUNTER — Other Ambulatory Visit: Payer: Self-pay

## 2018-08-30 DIAGNOSIS — M5136 Other intervertebral disc degeneration, lumbar region: Secondary | ICD-10-CM | POA: Diagnosis not present

## 2018-08-30 DIAGNOSIS — M5441 Lumbago with sciatica, right side: Secondary | ICD-10-CM | POA: Insufficient documentation

## 2018-08-30 DIAGNOSIS — M48061 Spinal stenosis, lumbar region without neurogenic claudication: Secondary | ICD-10-CM | POA: Insufficient documentation

## 2018-08-30 DIAGNOSIS — M4316 Spondylolisthesis, lumbar region: Secondary | ICD-10-CM | POA: Diagnosis not present

## 2018-08-30 DIAGNOSIS — M5137 Other intervertebral disc degeneration, lumbosacral region: Secondary | ICD-10-CM | POA: Insufficient documentation

## 2018-08-30 DIAGNOSIS — G8929 Other chronic pain: Secondary | ICD-10-CM

## 2018-08-30 DIAGNOSIS — K8020 Calculus of gallbladder without cholecystitis without obstruction: Secondary | ICD-10-CM | POA: Diagnosis not present

## 2018-08-30 DIAGNOSIS — M545 Low back pain, unspecified: Secondary | ICD-10-CM

## 2018-08-30 DIAGNOSIS — M5116 Intervertebral disc disorders with radiculopathy, lumbar region: Secondary | ICD-10-CM | POA: Diagnosis not present

## 2018-08-30 LAB — GLUCOSE, CAPILLARY: Glucose-Capillary: 117 mg/dL — ABNORMAL HIGH (ref 70–99)

## 2018-08-30 MED ORDER — ONDANSETRON HCL 4 MG/2ML IJ SOLN: 4.0000 mg | Freq: Four times a day (QID) | INTRAMUSCULAR | Status: DC | PRN

## 2018-08-30 MED ORDER — LIDOCAINE HCL (PF) 1 % IJ SOLN
INTRAMUSCULAR | Status: AC
  Administered 2018-08-30: 5 mL via INTRADERMAL
  Filled 2018-08-30: qty 5

## 2018-08-30 MED ORDER — LIDOCAINE HCL (PF) 1 % IJ SOLN
5.0000 mL | Freq: Once | INTRAMUSCULAR | Status: AC
  Administered 2018-08-30: 5 mL via INTRADERMAL

## 2018-08-30 MED ORDER — IOPAMIDOL (ISOVUE-M 200) INJECTION 41%
INTRAMUSCULAR | Status: AC
  Filled 2018-08-30: qty 10

## 2018-08-30 MED ORDER — IOPAMIDOL (ISOVUE-M 200) INJECTION 41%
20.0000 mL | Freq: Once | INTRAMUSCULAR | Status: AC
  Administered 2018-08-30: 13 mL via INTRATHECAL

## 2018-08-30 MED ORDER — DIAZEPAM 5 MG PO TABS
10.0000 mg | ORAL_TABLET | Freq: Once | ORAL | Status: AC
  Administered 2018-08-30: 10 mg via ORAL
  Filled 2018-08-30: qty 2

## 2018-08-30 MED ORDER — DIAZEPAM 5 MG PO TABS
ORAL_TABLET | ORAL | Status: AC
  Filled 2018-08-30: qty 2

## 2018-08-30 NOTE — Telephone Encounter (Signed)
A message was left, re: follow up visit. 

## 2018-08-30 NOTE — Discharge Instructions (Signed)
Myelogram, Care After  These instructions give you information about caring for yourself after your procedure. Your doctor may also give you more specific instructions. Call your doctor if you have any problems or questions after your procedure. Follow these instructions at home:  Drink enough fluid to keep your pee (urine) clear or pale yellow.  Rest as told by your doctor.  Lie flat with your head slightly raised (elevated).  Do not bend, lift, or do any hard activities for 24-48 hours or as told by your doctor.  Take over-the-counter and prescription medicines only as told by your doctor.  Take care of and remove your bandage (dressing) as told by your doctor.  Bathe or shower as told by your doctor. Contact a health care provider if:  You have a fever.  You have a headache that lasts longer than 24 hours.  You feel sick to your stomach (nauseous).  You throw up (vomit).  Your neck is stiff.  Your legs feel numb.  You cannot pee.  You cannot poop (have a bowel movement).  You have a rash.  You are itchy or sneezing. Get help right away if:  You have new symptoms or your symptoms get worse.  You have a seizure.  You have trouble breathing. This information is not intended to replace advice given to you by your health care provider. Make sure you discuss any questions you have with your health care provider. Document Released: 12/25/2007 Document Revised: 11/15/2015 Document Reviewed: 12/28/2014 Elsevier Interactive Patient Education  2019 [employer].

## 2018-08-30 NOTE — Procedures (Signed)
Procedure: Lsp Myelo via LP at L2/3. Specimen: none Bleeding: none. Complications: None immediate. Patient   -Condition: Stable.  -Disposition:  To CT and then short stay.  Full Radiology Report to follow under IMAGING

## 2018-09-08 ENCOUNTER — Encounter: Payer: Self-pay | Admitting: Internal Medicine

## 2018-09-08 ENCOUNTER — Ambulatory Visit (INDEPENDENT_AMBULATORY_CARE_PROVIDER_SITE_OTHER): Payer: Medicare Other | Admitting: Internal Medicine

## 2018-09-08 ENCOUNTER — Other Ambulatory Visit: Payer: Self-pay

## 2018-09-08 VITALS — BP 147/67 | HR 66 | Temp 98.6°F | Ht 62.0 in | Wt 167.8 lb

## 2018-09-08 DIAGNOSIS — E118 Type 2 diabetes mellitus with unspecified complications: Secondary | ICD-10-CM

## 2018-09-08 DIAGNOSIS — E1169 Type 2 diabetes mellitus with other specified complication: Secondary | ICD-10-CM | POA: Diagnosis not present

## 2018-09-08 DIAGNOSIS — N183 Chronic kidney disease, stage 3 unspecified: Secondary | ICD-10-CM

## 2018-09-08 DIAGNOSIS — Z9109 Other allergy status, other than to drugs and biological substances: Secondary | ICD-10-CM

## 2018-09-08 DIAGNOSIS — I129 Hypertensive chronic kidney disease with stage 1 through stage 4 chronic kidney disease, or unspecified chronic kidney disease: Secondary | ICD-10-CM | POA: Diagnosis not present

## 2018-09-08 DIAGNOSIS — E1140 Type 2 diabetes mellitus with diabetic neuropathy, unspecified: Secondary | ICD-10-CM | POA: Diagnosis not present

## 2018-09-08 DIAGNOSIS — M549 Dorsalgia, unspecified: Secondary | ICD-10-CM

## 2018-09-08 DIAGNOSIS — E1142 Type 2 diabetes mellitus with diabetic polyneuropathy: Secondary | ICD-10-CM

## 2018-09-08 DIAGNOSIS — G8929 Other chronic pain: Secondary | ICD-10-CM

## 2018-09-08 DIAGNOSIS — E1122 Type 2 diabetes mellitus with diabetic chronic kidney disease: Secondary | ICD-10-CM

## 2018-09-08 DIAGNOSIS — E785 Hyperlipidemia, unspecified: Secondary | ICD-10-CM

## 2018-09-08 DIAGNOSIS — Z794 Long term (current) use of insulin: Secondary | ICD-10-CM | POA: Diagnosis not present

## 2018-09-08 DIAGNOSIS — F17200 Nicotine dependence, unspecified, uncomplicated: Secondary | ICD-10-CM

## 2018-09-08 DIAGNOSIS — Z79899 Other long term (current) drug therapy: Secondary | ICD-10-CM

## 2018-09-08 DIAGNOSIS — I10 Essential (primary) hypertension: Secondary | ICD-10-CM

## 2018-09-08 DIAGNOSIS — Z7984 Long term (current) use of oral hypoglycemic drugs: Secondary | ICD-10-CM

## 2018-09-08 DIAGNOSIS — Z720 Tobacco use: Secondary | ICD-10-CM

## 2018-09-08 LAB — POCT GLYCOSYLATED HEMOGLOBIN (HGB A1C): Hemoglobin A1C: 6.4 % — AB (ref 4.0–5.6)

## 2018-09-08 LAB — GLUCOSE, CAPILLARY: Glucose-Capillary: 70 mg/dL (ref 70–99)

## 2018-09-08 MED ORDER — CAPSICUM OLEORESIN 0.025 % EX CREA: 1.0000 "application " | TOPICAL_CREAM | Freq: Three times a day (TID) | CUTANEOUS | 0 refills | Status: DC

## 2018-09-08 MED ORDER — FLUTICASONE PROPIONATE 50 MCG/ACT NA SUSP: NASAL | 5 refills | Status: DC

## 2018-09-08 NOTE — Assessment & Plan Note (Signed)
Denies side effects from atorvastatin. Will continue atorvastatin 40mg  qd

## 2018-09-08 NOTE — Assessment & Plan Note (Signed)
Reports improvement in chronic back pain with voltaren gel. Discussed results of CT. No surgical intervention available.   - continue voltaren gel, lidocaine gel, methadone, and prn ibuprofen

## 2018-09-08 NOTE — Assessment & Plan Note (Signed)
CBGs remain well controlled with diet and metformin 500mg  qd. She has been checking her CBGs and the range from 118-189. Denies symptomatic hypoglycemia.   - a1c today is 6.5, at goal - continue metformin 500mg  qd - told patient she can stop checking home CBGs. Only check it if she feels abnormal - repeat a1c in 6 months

## 2018-09-08 NOTE — Patient Instructions (Addendum)
It was nice seeing you today. Thank you for choosing [facility] for your Primary Care.   Today we talked about:  1) Right ear issues: stop using hydrogen peroxide. You can use rubbing alcohol instead and a q-tip instead of a bobby pen   2) Diabetes: you sugars looks great! Keep taking metformin. You can stop checking your sugars at home. Only check it if you feel funny. We will check another a1c in 6 months.   3) Hand numbness: We can try capsicum cream if you'd like. You just have to be careful not to touch anything until the cream is all the way absorbed. You can put gloves on after you apply the cream to keep you from touching things. The cream will burn if it touches your eyes.   If your blood work is abnormal, I will give you a call. If you don't hear from me then that means everything is normal!   FOLLOW-UP INSTRUCTIONS When: 6 months For: diabetes, high blood pressure, kidney disease, chronic back pain What to bring: all medications   Please contact the clinic if you have any problems, or need to be seen sooner.

## 2018-09-08 NOTE — Progress Notes (Signed)
   CC: diabetes  HPI:  [name] is a 63 y.o. F with CKD3, T2DM, HTN, chronic back pain presenting for f/u of chronic conditions.   Please see encounter tab for full details of HPI   Past Medical History:  Diagnosis Date  . Anxiety   . Arthritis   . Asthma   . Chronic back pain   . Depression   . Diabetes mellitus   . Edema of lower extremity history cellulitis jan 2013  . Frequency of urination   . Hep C w/o coma, chronic ([place])   . History of chronic bronchitis   . History of drug abuse ([place]) states quit herion in 2006  . Hypertension   . Methadone maintenance therapy patient ([place])   . Nocturia   . Tendonitis   . Urge urinary incontinence     Physical Exam:  Vitals:   09/08/18 1329  BP: (!) 147/67  Pulse: 66  Temp: 98.6 F (37 C)  TempSrc: Oral  SpO2: 100%  Weight: 167 lb 12.8 oz (76.1 kg)  Height: 5\' 2"  (1.575 m)   Gen: well appearing, NAD Cardiac: RRR, no mrg Pulm: CTAB Ext: sensation and strength is intact in bilateral hands, numbness is not provoked by wrist flexion  Assessment & Plan:   See Encounters Tab for problem based charting.  Patient discussed with Dr. Daryll Drown

## 2018-09-08 NOTE — Assessment & Plan Note (Addendum)
Reports relief with flonase and claritin. Denies sob, cough.  Will continue these medications daily

## 2018-09-08 NOTE — Assessment & Plan Note (Signed)
Smoking 3-4 cigarettes/day. Reports increased stress after a nephew passed away 2 weeks ago. She is interested in nicotine patches but would like to give them for free  - instructed to call national quit line 800-QUIT-NOW

## 2018-09-08 NOTE — Assessment & Plan Note (Addendum)
Above goal today. Reports compliance with HCTZ 25mg  qd and atenolol 50mg  qd. Reports home Bps around 130/60. Will defer adjusting medications given normal home BP recordings.   - continue HCTZ and atenolol as above - f/u BMP

## 2018-09-08 NOTE — Assessment & Plan Note (Signed)
Reports chronic, intermittent numbness in bilateral hands and feet. The numbness in her hands is more bothersome and makes it difficult to perform daily activities. Doubt carpal tunnel as the numbness covers the entirety of her palm and is not made worse by wrist flexion. Duloxetine provides some relief and she recently started using an OTC cream called Hempvanna which has been helping. We also discussed capsicum cream and she is interested in trying it. Counseled on precautions of not touching her face or anything else for 58min to 1 hr after the cream is applied. She can also wear gloves after she puts the cream on.   - a1c is at goal - continue duloxetine 60mg , she is at max dose - continue OTC hempvanna cream - Rx capsicum cream

## 2018-09-08 NOTE — Assessment & Plan Note (Signed)
2/2 HTN and DM. Reports intermittent ibuprofen use but has not used any in the last 2 weeks. Denies decreased urination. Will check BMP today.

## 2018-09-09 LAB — BMP8+ANION GAP
Anion Gap: 18 mmol/L (ref 10.0–18.0)
BUN/Creatinine Ratio: 11 — ABNORMAL LOW (ref 12–28)
BUN: 13 mg/dL (ref 8–27)
CO2: 22 mmol/L (ref 20–29)
Calcium: 9.7 mg/dL (ref 8.7–10.3)
Chloride: 102 mmol/L (ref 96–106)
Creatinine, Ser: 1.18 mg/dL — ABNORMAL HIGH (ref 0.57–1.00)
GFR calc Af Amer: 58 mL/min/{1.73_m2} — ABNORMAL LOW (ref >59)
GFR calc non Af Amer: 50 mL/min/{1.73_m2} — ABNORMAL LOW (ref >59)
Glucose: 71 mg/dL (ref 65–99)
Potassium: 4.2 mmol/L (ref 3.5–5.2)
Sodium: 142 mmol/L (ref 134–144)

## 2018-09-09 LAB — MICROALBUMIN / CREATININE URINE RATIO
Creatinine, Urine: 28.6 mg/dL
Microalb/Creat Ratio: 23 mg/g creat (ref 0–29)
Microalbumin, Urine: 6.7 ug/mL

## 2018-09-13 ENCOUNTER — Other Ambulatory Visit: Payer: Self-pay | Admitting: Internal Medicine

## 2018-09-13 DIAGNOSIS — Z794 Long term (current) use of insulin: Secondary | ICD-10-CM

## 2018-09-13 DIAGNOSIS — E118 Type 2 diabetes mellitus with unspecified complications: Secondary | ICD-10-CM

## 2018-09-13 NOTE — Progress Notes (Signed)
Internal Medicine Clinic Attending  Case discussed with Dr. Vogel  at the time of the visit.  We reviewed the resident's history and exam and pertinent patient test results.  I agree with the assessment, diagnosis, and plan of care documented in the resident's note.  

## 2018-09-13 NOTE — Telephone Encounter (Signed)
Checked with patient, currently taking 500mg  BID. Updated Rx.  Also for Dr Donne Hazel. She mentioned that she thought you were going to prescribe something for her ears, I discussed the recommendation of the rubbing alcohol and discussed she could also try warm water but that I would sent you a message to make sure you were not considering anything else.  Also discussed if issues fail to resolve let us know and we can refer to ENT.

## 2018-09-15 ENCOUNTER — Encounter: Payer: Medicare Other | Admitting: Internal Medicine

## 2018-09-18 ENCOUNTER — Encounter: Payer: Self-pay | Admitting: *Deleted

## 2018-10-07 ENCOUNTER — Other Ambulatory Visit: Payer: Self-pay | Admitting: *Deleted

## 2018-10-07 DIAGNOSIS — M62838 Other muscle spasm: Secondary | ICD-10-CM

## 2018-10-07 DIAGNOSIS — I5032 Chronic diastolic (congestive) heart failure: Secondary | ICD-10-CM

## 2018-10-07 MED ORDER — ATENOLOL 50 MG PO TABS: 25.0000 mg | ORAL_TABLET | Freq: Every day | ORAL | 1 refills | Status: DC

## 2018-10-07 MED ORDER — TIZANIDINE HCL 4 MG PO TABS: 4.0000 mg | ORAL_TABLET | Freq: Three times a day (TID) | ORAL | 1 refills | Status: DC | PRN

## 2018-10-11 ENCOUNTER — Other Ambulatory Visit: Payer: Self-pay | Admitting: Internal Medicine

## 2018-10-11 NOTE — Telephone Encounter (Signed)
Needs refill on atenolol (TENORMIN) 50 MG tablet tiZANidine (ZANAFLEX) 4 MG tablet fluticasone (FLONASE) 50 MCG/ACT nasal spray   ;pt contact [phone] [id number] - [place], [place] - [place]

## 2018-10-11 NOTE — Telephone Encounter (Signed)
Atenolol and tizanidine were sent to [facility] on 10/07/2018 and 5 refills of fluticasone were sent on 09/08/2018. Returned call to patient. No answer. Left message on VM informing her of these refills and to call pharmacy. Requested return call if needed. Hubbard Hartshorn, RN, BSN

## 2018-10-14 ENCOUNTER — Ambulatory Visit (INDEPENDENT_AMBULATORY_CARE_PROVIDER_SITE_OTHER): Payer: Medicare Other | Admitting: Podiatry

## 2018-10-14 ENCOUNTER — Other Ambulatory Visit: Payer: Self-pay

## 2018-10-14 ENCOUNTER — Ambulatory Visit: Payer: Medicare Other | Admitting: Podiatry

## 2018-10-14 VITALS — Temp 97.0°F

## 2018-10-14 DIAGNOSIS — E1169 Type 2 diabetes mellitus with other specified complication: Secondary | ICD-10-CM

## 2018-10-14 DIAGNOSIS — B351 Tinea unguium: Secondary | ICD-10-CM | POA: Diagnosis not present

## 2018-10-14 DIAGNOSIS — E1142 Type 2 diabetes mellitus with diabetic polyneuropathy: Secondary | ICD-10-CM | POA: Diagnosis not present

## 2018-10-20 ENCOUNTER — Other Ambulatory Visit: Payer: Self-pay

## 2018-10-20 NOTE — Patient Outreach (Signed)
[facility]) Care Management  10/20/2018  Rose [name] [birth year] [id number]   Medication Adherence call to [name] [facility]iant Voice message left with a call back number. [name] is showing past due on Metformin 500 mg under [place].   [facility] Management Direct Dial [phone]  Fax [phone] [name]

## 2018-10-26 ENCOUNTER — Emergency Department (HOSPITAL_COMMUNITY): Payer: Medicare Other

## 2018-10-26 ENCOUNTER — Encounter (HOSPITAL_COMMUNITY): Payer: Self-pay | Admitting: Emergency Medicine

## 2018-10-26 ENCOUNTER — Emergency Department (HOSPITAL_COMMUNITY)
Admission: EM | Admit: 2018-10-26 | Discharge: 2018-10-26 | Disposition: A | Discharge status: Home / Self Care | Payer: Medicare Other | Attending: Emergency Medicine | Admitting: Emergency Medicine

## 2018-10-26 ENCOUNTER — Other Ambulatory Visit: Payer: Self-pay

## 2018-10-26 DIAGNOSIS — I10 Essential (primary) hypertension: Secondary | ICD-10-CM | POA: Insufficient documentation

## 2018-10-26 DIAGNOSIS — W010XXA Fall on same level from slipping, tripping and stumbling without subsequent striking against object, initial encounter: Secondary | ICD-10-CM | POA: Diagnosis not present

## 2018-10-26 DIAGNOSIS — F17210 Nicotine dependence, cigarettes, uncomplicated: Secondary | ICD-10-CM | POA: Diagnosis not present

## 2018-10-26 DIAGNOSIS — Z79899 Other long term (current) drug therapy: Secondary | ICD-10-CM | POA: Insufficient documentation

## 2018-10-26 DIAGNOSIS — Y998 Other external cause status: Secondary | ICD-10-CM | POA: Diagnosis not present

## 2018-10-26 DIAGNOSIS — S2232XA Fracture of one rib, left side, initial encounter for closed fracture: Secondary | ICD-10-CM | POA: Insufficient documentation

## 2018-10-26 DIAGNOSIS — W19XXXA Unspecified fall, initial encounter: Secondary | ICD-10-CM

## 2018-10-26 DIAGNOSIS — Y9389 Activity, other specified: Secondary | ICD-10-CM | POA: Diagnosis not present

## 2018-10-26 DIAGNOSIS — R609 Edema, unspecified: Secondary | ICD-10-CM | POA: Diagnosis not present

## 2018-10-26 DIAGNOSIS — J45909 Unspecified asthma, uncomplicated: Secondary | ICD-10-CM | POA: Insufficient documentation

## 2018-10-26 DIAGNOSIS — R600 Localized edema: Secondary | ICD-10-CM | POA: Diagnosis not present

## 2018-10-26 DIAGNOSIS — S299XXA Unspecified injury of thorax, initial encounter: Secondary | ICD-10-CM | POA: Diagnosis present

## 2018-10-26 DIAGNOSIS — Z7982 Long term (current) use of aspirin: Secondary | ICD-10-CM | POA: Insufficient documentation

## 2018-10-26 DIAGNOSIS — Y9289 Other specified places as the place of occurrence of the external cause: Secondary | ICD-10-CM | POA: Insufficient documentation

## 2018-10-26 DIAGNOSIS — E119 Type 2 diabetes mellitus without complications: Secondary | ICD-10-CM | POA: Diagnosis not present

## 2018-10-26 DIAGNOSIS — M545 Low back pain: Secondary | ICD-10-CM | POA: Diagnosis not present

## 2018-10-26 MED ORDER — IBUPROFEN 400 MG PO TABS
600.0000 mg | ORAL_TABLET | Freq: Once | ORAL | Status: AC
  Administered 2018-10-26: 600 mg via ORAL
  Filled 2018-10-26: qty 1

## 2018-10-26 NOTE — ED Provider Notes (Signed)
[facility] EMERGENCY DEPARTMENT Provider Note   CSN: [id number] Arrival date & time: 10/26/18  1009    History   Chief Complaint Chief Complaint  Patient presents with  . Fall    HPI Rose [name] is a 63 y.o. female.     HPI   63 year old female presents today with complaints of back and rib pain status post fall.  Patient notes that 4 days ago she had a mechanical fall after tripping over her cane falling back and hitting the fridge.  She notes she struck her lower back and lower ribs on the left.  She denies any loss of consciousness, denies any abdominal pain chest pain shortness of breath.  She does note some minimal pain with deep inspiration.  She notes she takes methadone at home which has helped improved her pain minimally.  She notes the back pain is worse with extension and feels better leaning forward.  She has been putting rubbing alcohol and using aspirin for the pain.  She denies any history of heart failure, notes that she has had chronic edema in her bilateral lower extremities and notes some edema to her abdomen.. She denies any chest pain or shortness of breath.      Past Medical History:  Diagnosis Date  . Anxiety   . Arthritis   . Asthma   . Chronic back pain   . Depression   . Diabetes mellitus   . Edema of lower extremity history cellulitis jan 2013  . Frequency of urination   . Hep C w/o coma, chronic ([place])   . History of chronic bronchitis   . History of drug abuse ([place]) states quit herion in 2006  . Hypertension   . Methadone maintenance therapy patient ([place])   . Nocturia   . Tendonitis   . Urge urinary incontinence     Patient Active Problem List   Diagnosis Date Noted  . Lipoma of neck 07/16/2017  . Diabetic neuropathy ([place]) 06/02/2017  . Shortness of breath 01/23/2017  . HPV in female 06/11/2016  . Recurrent falls 04/30/2016  . ACE inhibitor-aggravated angioedema, initial encounter 04/26/2016  . Environmental  allergies 12/28/2015  . Hyperlipidemia associated with type 2 diabetes mellitus ([place]) 12/13/2015  . Tobacco use disorder 12/13/2015  . Chronic renal insufficiency 12/12/2015  . Depression 12/12/2015  . Methadone maintenance therapy patient ([place])   . History of drug abuse ([place])   . Chronic back pain   . Preventative health care 12/14/2014  . Type 2 diabetes mellitus ([place]) 02/27/2012  . Essential hypertension 02/27/2012    Past Surgical History:  Procedure Laterality Date  . APPENDECTOMY  1972  . INTERSTIM IMPLANT PLACEMENT  10/23/2011   Procedure: Barrie Lyme IMPLANT FIRST STAGE;  Surgeon: Reece Packer, MD;  Location: [facility];  Service: Urology;  Laterality: N/A;  . INTERSTIM IMPLANT PLACEMENT  10/23/2011   Procedure: Barrie Lyme IMPLANT SECOND STAGE;  Surgeon: Reece Packer, MD;  Location: [facility];  Service: Urology;  Laterality: N/A;     OB History   No obstetric history on file.      Home Medications    Prior to Admission medications   Medication Sig Start Date End Date Taking? Authorizing Provider  albuterol (PROVENTIL HFA;VENTOLIN HFA) 108 (90 Base) MCG/ACT inhaler Inhale 2 puffs into the lungs every 6 (six) hours as needed. For wheezing 04/19/15  Yes Jule Ser, DO  aspirin EC 81 MG tablet Take 1 tablet (81 mg total)  by mouth daily. 12/19/16  Yes End, Harrell Gave, MD  atenolol (TENORMIN) 50 MG tablet Take 0.5 tablets (25 mg total) by mouth daily. TAKE 1/2 TABLET(25 MG) BY MOUTH DAILY Patient taking differently: Take 25 mg by mouth daily.  10/07/18  Yes Axel Filler, MD  atorvastatin (LIPITOR) 40 MG tablet Take 1 tablet (40 mg total) by mouth daily. 12/07/17 12/07/18 Yes Carroll Sage, MD  buPROPion (WELLBUTRIN) 75 MG tablet Take 75 mg by mouth daily. 08/11/18  Yes [provider]  capsicum oleoresin (TRIXAICIN) 0.025 % cream Apply 1 application topically 3 (three) times daily. Patient taking differently: Apply 1  application topically as needed (pain).  09/08/18  Yes Isabelle Course, MD  cetirizine (ZYRTEC) 10 MG tablet Take 10 mg by mouth daily.  04/12/18  Yes [provider]  citalopram (CELEXA) 20 MG tablet Take 20 mg by mouth daily.   Yes [provider]  diclofenac sodium (VOLTAREN) 1 % GEL Apply 2 g topically 4 (four) times daily. Patient taking differently: Apply 2 g topically 4 (four) times daily as needed (pain).  06/01/18  Yes Hoffman, Jessica Ratliff, DO  DULoxetine (CYMBALTA) 60 MG capsule TAKE 1 CAPSULE(60 MG) BY MOUTH DAILY Patient taking differently: Take 60 mg by mouth daily. TAKE 1 CAPSULE(60 MG) BY MOUTH DAILY 03/02/18  Yes Isabelle Course, MD  fluticasone [facility]) 50 MCG/ACT nasal spray SHAKE LIQUID AND USE 1 SPRAY IN EACH NOSTRIL DAILY Patient taking differently: Place 1 spray into both nostrils daily.  09/08/18  Yes Isabelle Course, MD  hydrochlorothiazide (HYDRODIURIL) 25 MG tablet TAKE 1 TABLET(25 MG) BY MOUTH DAILY Patient taking differently: Take 25 mg by mouth daily.  08/03/18  Yes Isabelle Course, MD  lidocaine (XYLOCAINE) 5 % ointment Apply 1 application topically as needed. Patient taking differently: Apply 1 application topically daily as needed for mild pain.  03/11/18  Yes Isabelle Course, MD  loratadine (CLARITIN) 10 MG tablet Take 1 tablet (10 mg total) by mouth daily. 03/12/18  Yes Isabelle Course, MD  metFORMIN (GLUCOPHAGE) 500 MG tablet Take 1 tablet (500 mg total) by mouth 2 (two) times daily with a meal. 09/13/18  Yes Lucious Groves, DO  methadone (DOLOPHINE) 10 MG/5ML solution Take 100 mg by mouth daily.   Yes [provider]  Multiple Vitamins-Minerals (ONE-A-DAY WOMENS 50+ ADVANTAGE) TABS Take 1 tablet by mouth daily.    Yes [provider]  naproxen sodium (ALEVE) 220 MG tablet Take 220 mg by mouth 2 (two) times daily as needed (pain).    Yes [provider]  tiZANidine (ZANAFLEX) 4 MG tablet Take 1 tablet (4 mg total) by mouth every  8 (eight) hours as needed for muscle spasms. 10/07/18  Yes Axel Filler, MD  traZODone (DESYREL) 100 MG tablet Take 100 mg by mouth at bedtime as needed for sleep.  08/20/17  Yes [provider]  nicotine (NICODERM CQ - DOSED IN MG/24 HOURS) 14 mg/24hr patch Medicaid [id number] L Wks 1-6: 14 mg x 1 patch daily. Wear for 24 hours. If you have sleep disturbances, remove at bedtime. Patient not taking: Reported on 10/26/2018 03/11/18   Isabelle Course, MD    Family History Family History  Problem Relation Age of Onset  . Hypertension Mother   . Diabetes type II Mother   . Heart attack Mother 64  . CAD Father   . Heart attack Father 1  . Hypertension Brother   . Diabetes type II Brother   .  Heart attack Brother 56  . Diabetes type II Sister     Social History Social History   Tobacco Use  . Smoking status: Current Every Day Smoker    Packs/day: 0.50    Years: 20.00    Pack years: 10.00    Types: Cigarettes  . Smokeless tobacco: Never Used  Substance Use Topics  . Alcohol use: No    Alcohol/week: 0.0 standard drinks  . Drug use: No     Allergies   Lisinopril, Gabapentin, and Vicodin [hydrocodone-acetaminophen]   Review of Systems Review of Systems  All other systems reviewed and are negative.    Physical Exam Updated Vital Signs BP 124/81 (BP Location: Right Arm)   Pulse 69   Temp 98.1 F (36.7 C)   Resp 14   LMP 07/31/2011   SpO2 98%   Physical Exam Vitals signs and nursing note reviewed.  Constitutional:      Appearance: She is well-developed.  HENT:     Head: Normocephalic and atraumatic.  Eyes:     General: No scleral icterus.       Right eye: No discharge.        Left eye: No discharge.     Conjunctiva/sclera: Conjunctivae normal.     Pupils: Pupils are equal, round, and reactive to light.  Neck:     Musculoskeletal: Normal range of motion.     Vascular: No JVD.     Trachea: No tracheal deviation.  Cardiovascular:     Rate and  Rhythm: Normal rate and regular rhythm.  Pulmonary:     Effort: Pulmonary effort is normal.     Breath sounds: No stridor.  Abdominal:     Comments: Abdomen soft nontender  Musculoskeletal:     Comments: Bruising noted to the left lateral lower back and posterior ribs, tenderness to this area, no significant midline bony tenderness-bilateral upper and lower extremity sensation strength motor function intact-minimal pitting edema to the bilateral lower extremities, chronic skin changes noted  Neurological:     Mental Status: She is alert and oriented to person, place, and time.     Coordination: Coordination normal.  Psychiatric:        Behavior: Behavior normal.        Thought Content: Thought content normal.        Judgment: Judgment normal.      ED Treatments / Results  Labs (all labs ordered are listed, but only abnormal results are displayed) Labs Reviewed - No data to display  EKG None  Radiology Dg Ribs Unilateral W/chest Left  Result Date: 10/26/2018 CLINICAL DATA:  Pt reports tripping over her cane and falling, hitting her back on the corner of the refrigerator on Friday. Pt with bruising present to mid back and left. States that it hurts to breathe or laugh. resp EXAM: LEFT RIBS AND CHEST - 3+ VIEW COMPARISON:  11/06/2011 FINDINGS: Heart is mildly enlarged. There are Kerley B-lines at the bases consistent with mild interstitial edema. Oblique views demonstrate a nondisplaced fracture of the LEFT 10th rib. No pneumothorax. IMPRESSION: 1. Cardiomegaly and mild interstitial edema. 2. LEFT 10th rib fracture. Electronically Signed   By: Nolon Nations M.D.   On: 10/26/2018 13:38   Dg Lumbar Spine Complete  Result Date: 10/26/2018 CLINICAL DATA:  Low back pain since a fall 5 days ago. EXAM: LUMBAR SPINE - COMPLETE 4+ VIEW COMPARISON:  Postmyelogram CT scan of the lumbar spine 08/30/2018. FINDINGS: There is no evidence of lumbar spine fracture. Alignment  is normal.  Intervertebral disc spaces are maintained. Sacral stimulator is noted. There is a very large volume of stool throughout the colon. IMPRESSION: Normal appearing lumbar spine. Very large colonic stool burden throughout. Electronically Signed   By: Inge Rise M.D.   On: 10/26/2018 13:14    Procedures Procedures (including critical care time)  Medications Ordered in ED Medications  ibuprofen (ADVIL) tablet 600 mg (600 mg Oral Given 10/26/18 1413)     Initial Impression / Assessment and Plan / ED Course  I have reviewed the triage vital signs and the nursing notes.  Pertinent labs & imaging results that were available during my care of the patient were reviewed by me and considered in my medical decision making (see chart for details).        63 year old female presents today status post fall.  She does have a uncomplicated rib fracture.  No other bony abnormality.  Low suspicion for extensive bony abnormality.  She has no signs of respiratory distress or infection.  She has no signs of intra-abdominal pathology or distal neurological deficits.  Patient does have chronic edema to her lower extremity, her chest x-ray does show mild enlargement of heart and fluid with questionable heart failure.  This is not new for her, as the swelling is chronic, I do not see any recent imaging of the chest showing cardiomegaly or edema.  Patient has no shortness of breath, I discussed the case with attending physician who agreed that patient would be stable for close follow-up with her primary care provider.  She is a patient of the internal medicine teaching service.  I discussed patient's care with her daughter on the phone who will contact internal medicine and schedule a close follow-up appointment.  She will return immediately she develops any new or worsening signs or symptoms.  She was given incentive spirometer here.  Both patient and daughter verbalized understanding and agreement to today's plan had  no further questions or concerns at the time of discharge.  Final Clinical Impressions(s) / ED Diagnoses   Final diagnoses:  Fall, initial encounter  Closed fracture of one rib of left side, initial encounter  Edema, unspecified type    ED Discharge Orders    None       Okey Regal, PA-C 10/26/18 1426    Veryl Speak, MD 10/27/18 [phone]

## 2018-10-26 NOTE — ED Triage Notes (Signed)
Pt reports tripping over her cane and falling, hitting her back on the corner of the refrigerator on Friday. Pt with bruising present to mid back. States that it hurts to breathe or laugh. resp e/u, nad.

## 2018-10-26 NOTE — ED Notes (Signed)
Pt demonstrated use of inspiorometry

## 2018-10-26 NOTE — Discharge Instructions (Addendum)
Please read the attached information.  As we discussed please contact your primary care provider today and schedule follow-up evaluation for further evaluation and management of your chest x-ray findings.  If develop any new or worsening signs or symptoms please return the emergency room for further evaluation and management.  Please continue using Tylenol as needed for discomfort.  Please use incentive spirometer as shown.

## 2018-10-28 ENCOUNTER — Other Ambulatory Visit: Payer: Self-pay

## 2018-10-28 ENCOUNTER — Encounter: Payer: Self-pay | Admitting: Internal Medicine

## 2018-10-28 ENCOUNTER — Ambulatory Visit (INDEPENDENT_AMBULATORY_CARE_PROVIDER_SITE_OTHER): Payer: Medicare Other | Admitting: Internal Medicine

## 2018-10-28 VITALS — BP 124/59 | HR 76 | Temp 98.4°F | Ht 62.0 in | Wt 168.7 lb

## 2018-10-28 DIAGNOSIS — G8929 Other chronic pain: Secondary | ICD-10-CM

## 2018-10-28 DIAGNOSIS — J309 Allergic rhinitis, unspecified: Secondary | ICD-10-CM | POA: Diagnosis not present

## 2018-10-28 DIAGNOSIS — R600 Localized edema: Secondary | ICD-10-CM | POA: Diagnosis not present

## 2018-10-28 DIAGNOSIS — W19XXXD Unspecified fall, subsequent encounter: Secondary | ICD-10-CM | POA: Diagnosis not present

## 2018-10-28 DIAGNOSIS — K8020 Calculus of gallbladder without cholecystitis without obstruction: Secondary | ICD-10-CM | POA: Diagnosis not present

## 2018-10-28 DIAGNOSIS — N189 Chronic kidney disease, unspecified: Secondary | ICD-10-CM | POA: Diagnosis not present

## 2018-10-28 DIAGNOSIS — M5441 Lumbago with sciatica, right side: Secondary | ICD-10-CM

## 2018-10-28 DIAGNOSIS — Z8781 Personal history of (healed) traumatic fracture: Secondary | ICD-10-CM

## 2018-10-28 DIAGNOSIS — Z79899 Other long term (current) drug therapy: Secondary | ICD-10-CM

## 2018-10-28 DIAGNOSIS — S2232XD Fracture of one rib, left side, subsequent encounter for fracture with routine healing: Secondary | ICD-10-CM

## 2018-10-28 HISTORY — DX: Localized edema: R60.0

## 2018-10-28 HISTORY — DX: Calculus of gallbladder without cholecystitis without obstruction: K80.20

## 2018-10-28 HISTORY — DX: Personal history of (healed) traumatic fracture: Z87.81

## 2018-10-28 LAB — BRAIN NATRIURETIC PEPTIDE: B Natriuretic Peptide: 119.3 pg/mL — ABNORMAL HIGH (ref 0.0–100.0)

## 2018-10-28 MED ORDER — FLUTICASONE PROPIONATE 50 MCG/ACT NA SUSP: NASAL | 5 refills | Status: DC

## 2018-10-28 MED ORDER — DICLOFENAC SODIUM 1 % TD GEL: 2.0000 g | Freq: Four times a day (QID) | TRANSDERMAL | 1 refills | Status: DC | PRN

## 2018-10-28 NOTE — Assessment & Plan Note (Addendum)
Patient found to have cholelithiasis on imaging that performed during recent ED visit for fall. It reported as cholelithiasis which may be severe and/or associated with porcelain gallbladder.  -RUQ [place] for further evaluation and if concerning for porcelain gallblather (and malignancy) will refer to surgery

## 2018-10-28 NOTE — Assessment & Plan Note (Addendum)
Patient has bilateral 2+ lower extremity pitting edema that developed in past few weeks. She has some chronic SOB. Recent CXR showed intrastitial edema.  JVP was not assessed since patient can not lie back flat due to back and rib pain.  Will evaluate for heart failure with checking BNP and Echo. She has a f/u appointment with her cardiologist next month. F/u in clinic in 4 weeks or sooner if worsening of LEE or developing SOB

## 2018-10-28 NOTE — Assessment & Plan Note (Signed)
Still has some pain after recent fall and uncomplicated rib fx. -Informed her and her daughter that it will take time to get relief of pain.  -Voltaren gel PRN (Hesitating to give PO NSAID as much as possible due to CKD) -Can continue taking Tylenol -Provided support belt  -f/u in clinic in 2 weeks if no improvement or sooner if pain get worse

## 2018-10-28 NOTE — Progress Notes (Signed)
   CC: Follow up of recent ED visit and rib Fx  HPI:  [name] is a 63 y.o. with PMHx as documented below, presented for f/u of recent ED visit for fall and rib Fx.  Please refer to problem based charting for further details and assessment and plan of current problem and chronic medical conditions.   Past Medical History:  Diagnosis Date  . Anxiety   . Arthritis   . Asthma   . Chronic back pain   . Depression   . Diabetes mellitus   . Edema of lower extremity history cellulitis jan 2013  . Frequency of urination   . Hep C w/o coma, chronic ([place])   . History of chronic bronchitis   . History of drug abuse ([place]) states quit herion in 2006  . Hypertension   . Methadone maintenance therapy patient ([place])   . Nocturia   . Tendonitis   . Urge urinary incontinence    Review of Systems:    Physical Exam:  Vitals:   10/28/18 1509  BP: (!) 124/59  Pulse: 76  Temp: 98.4 F (36.9 C)  TempSrc: Oral  SpO2: 100%  Weight: 168 lb 11.2 oz (76.5 kg)  Height: 5\' 2"  (1.575 m)   Physical Exam  Constitutional: Somnolent, No acute distress.  Cardiovascular:  RRR, nl S1S2, no murmur,  2+ LE pitting edema up to mid tibia Respiratory: Effort normal and breath sounds normal. No respiratory distress. Has rhonchi Neurological: Is alert and oriented x 3  Musculoskeletal: bruise at left middle back, Tenderness at left anterior and posterior lower chest and back       Assessment & Plan:   See Encounters Tab for problem based charting.  Patient discussed with Dr. Dareen [name]

## 2018-10-28 NOTE — Patient Instructions (Signed)
It was our pleasure taking care of you in our clinic today.  You were seen for follow-up of rib fracture.  It may take sometimes to get relief after a rib fracture. I prescrib Voltaren gel for you to be used 2-4 times daily as needed for pain.  You can also take Tylenol 3 times daily as needed.   We also gave you a belt to support your back to help with the pain.   Continue using the small breathing device (incentive spirometer)  that you were given in emergency room to keep your lungs open.  If your symptoms did not get better in 2 weeks or got worse, please come back to clinic.  Because of your leg swelling, I do some blood work and order a heart ultrasound and will call you if the results will be abnormal.  I also order an abdomen ultrasound to check your gallbladder since you are found to have gallbladder stone on the imaging that was performed on emergency room and recommended to have further imaging. I will call you if the results will be abnormal.  I also sent refills on some of your medication as you requested.  Please pick them up from pharmacy and take them as instructed.  Please take rest of your medications as instructed and contact us if you have any question or concern.   As always, if having severe symptoms, please seek medical attention at emergency room.  Thank you

## 2018-10-28 NOTE — Assessment & Plan Note (Signed)
Sent refill for Flonase today.

## 2018-10-29 NOTE — Progress Notes (Signed)
Internal Medicine Clinic Attending  Case discussed with Dr. Masoudi  at the time of the visit.  We reviewed the resident's history and exam and pertinent patient test results.  I agree with the assessment, diagnosis, and plan of care documented in the resident's note.  

## 2018-10-31 NOTE — Progress Notes (Signed)
Subjective:  Patient ID: Rose [name], female    DOB: [birth year],  MRN: [id number]  Chief Complaint  Patient presents with  . Nail Problem    Nail trim 1-5 bilateral    63 y.o. female presents  for diabetic foot care. Hx as above.  Review of Systems: Negative except as noted in the HPI. Denies N/V/F/Ch.  Past Medical History:  Diagnosis Date  . Anxiety   . Arthritis   . Asthma   . Chronic back pain   . Depression   . Diabetes mellitus   . Frequency of urination   . Hep C w/o coma, chronic ([place])   . History of chronic bronchitis   . History of drug abuse ([place]) states quit herion in 2006  . Hypertension   . Methadone maintenance therapy patient ([place])   . Nocturia   . Tendonitis   . Urge urinary incontinence     Current Outpatient Medications:  .  albuterol (PROVENTIL HFA;VENTOLIN HFA) 108 (90 Base) MCG/ACT inhaler, Inhale 2 puffs into the lungs every 6 (six) hours as needed. For wheezing, Disp: 1 Inhaler, Rfl: 2 .  aspirin EC 81 MG tablet, Take 1 tablet (81 mg total) by mouth daily., Disp: , Rfl:  .  atenolol (TENORMIN) 50 MG tablet, Take 0.5 tablets (25 mg total) by mouth daily. TAKE 1/2 TABLET(25 MG) BY MOUTH DAILY (Patient taking differently: Take 25 mg by mouth daily. ), Disp: 45 tablet, Rfl: 1 .  atorvastatin (LIPITOR) 40 MG tablet, Take 1 tablet (40 mg total) by mouth daily., Disp: 90 tablet, Rfl: 3 .  buPROPion (WELLBUTRIN) 75 MG tablet, Take 75 mg by mouth daily., Disp: , Rfl:  .  capsicum oleoresin (TRIXAICIN) 0.025 % cream, Apply 1 application topically 3 (three) times daily. (Patient taking differently: Apply 1 application topically as needed (pain). ), Disp: 56.6 g, Rfl: 0 .  cetirizine (ZYRTEC) 10 MG tablet, Take 10 mg by mouth daily. , Disp: , Rfl:  .  citalopram (CELEXA) 20 MG tablet, Take 20 mg by mouth daily., Disp: , Rfl:  .  DULoxetine (CYMBALTA) 60 MG capsule, TAKE 1 CAPSULE(60 MG) BY MOUTH DAILY (Patient taking differently: Take 60 mg by mouth daily.  TAKE 1 CAPSULE(60 MG) BY MOUTH DAILY), Disp: 90 capsule, Rfl: 3 .  hydrochlorothiazide (HYDRODIURIL) 25 MG tablet, TAKE 1 TABLET(25 MG) BY MOUTH DAILY (Patient taking differently: Take 25 mg by mouth daily. ), Disp: 90 tablet, Rfl: 1 .  lidocaine (XYLOCAINE) 5 % ointment, Apply 1 application topically as needed. (Patient taking differently: Apply 1 application topically daily as needed for mild pain. ), Disp: 50 g, Rfl: 0 .  loratadine (CLARITIN) 10 MG tablet, Take 1 tablet (10 mg total) by mouth daily., Disp: 30 tablet, Rfl: 11 .  metFORMIN (GLUCOPHAGE) 500 MG tablet, Take 1 tablet (500 mg total) by mouth 2 (two) times daily with a meal., Disp: 180 tablet, Rfl: 1 .  methadone (DOLOPHINE) 10 MG/5ML solution, Take 100 mg by mouth daily., Disp: , Rfl:  .  Multiple Vitamins-Minerals (ONE-A-DAY WOMENS 50+ ADVANTAGE) TABS, Take 1 tablet by mouth daily. , Disp: , Rfl:  .  naproxen sodium (ALEVE) 220 MG tablet, Take 220 mg by mouth 2 (two) times daily as needed (pain). , Disp: , Rfl:  .  nicotine (NICODERM CQ - DOSED IN MG/24 HOURS) 14 mg/24hr patch, Medicaid [id number] L Wks 1-6: 14 mg x 1 patch daily. Wear for 24 hours. If you have sleep disturbances, remove at bedtime. (Patient not taking:  Reported on 10/26/2018), Disp: 42 patch, Rfl: 0 .  tiZANidine (ZANAFLEX) 4 MG tablet, Take 1 tablet (4 mg total) by mouth every 8 (eight) hours as needed for muscle spasms., Disp: 90 tablet, Rfl: 1 .  traZODone (DESYREL) 100 MG tablet, Take 100 mg by mouth at bedtime as needed for sleep. , Disp: , Rfl: 2 .  diclofenac sodium (VOLTAREN) 1 % GEL, Apply 2 g topically 4 (four) times daily as needed (pain)., Disp: 150 g, Rfl: 1 .  fluticasone (FLONASE) 50 MCG/ACT nasal spray, SHAKE LIQUID AND USE 1 SPRAY IN EACH NOSTRIL DAILY, Disp: 16 g, Rfl: 5  Social History   Tobacco Use  Smoking Status Current Every Day Smoker  . Packs/day: 0.50  . Years: 20.00  . Pack years: 10.00  . Types: Cigarettes  Smokeless Tobacco Never Used   Tobacco Comment   5 cigs per day     Allergies  Allergen Reactions  . Lisinopril Swelling    Angioedema  . Gabapentin Hives and Other (See Comments)    Only when takes over 300 mg dose. "gives me the shakes"  . Vicodin [Hydrocodone-Acetaminophen] Hives, Diarrhea and Other (See Comments)    Stomach  cramps   Objective:   Vitals:   10/14/18 0932  Temp: (!) 97 F (36.1 C)   There is no height or weight on file to calculate BMI. Constitutional Well developed. Well nourished.  Vascular Dorsalis pedis pulses present 1+ bilaterally  Posterior tibial pulses present 1+ bilaterally  Pedal hair growth diminished. Capillary refill normal to all digits.  No cyanosis or clubbing noted.  Neurologic Normal speech. Oriented to person, place, and time. Epicritic sensation to light touch grossly present bilaterally. Protective sensation with 5.07 monofilament  absent bilaterally.  Dermatologic Nails elongated, thickened, dystrophic. No open wounds. HPK L 5th MPJ, R 1st MPJ  Orthopedic: Normal joint ROM without pain or crepitus bilaterally. No visible deformities. No bony tenderness.   Assessment:   1. Onychomycosis of multiple toenails with type 2 diabetes mellitus and peripheral neuropathy ([place])    Plan:  Patient was evaluated and treated and all questions answered.  Diabetes with DPN, Onychomycosis -Routine foot care as below  Procedure: Nail Debridement Rationale: Patient meets criteria for routine foot care due to DPN Type of Debridement: manual, sharp debridement. Instrumentation: Nail nipper, rotary burr. Number of Nails: 10     Return in about 3 months (around 01/14/2019) for Diabetic Foot Care.

## 2018-11-02 ENCOUNTER — Other Ambulatory Visit: Payer: Self-pay

## 2018-11-02 MED ORDER — CETIRIZINE HCL 10 MG PO TABS: 10.0000 mg | ORAL_TABLET | Freq: Every day | ORAL | 1 refills | Status: DC

## 2018-11-02 NOTE — Telephone Encounter (Signed)
cetirizine (ZYRTEC) 10 MG tablet   Refill request @  [id number] - [place], [place] [address] [phone] (Phone) [phone] (Fax)   Pt states she is completely out of med.

## 2018-11-08 ENCOUNTER — Ambulatory Visit (HOSPITAL_COMMUNITY): Payer: Medicare Other

## 2018-11-10 ENCOUNTER — Ambulatory Visit (HOSPITAL_COMMUNITY)
Admission: RE | Admit: 2018-11-10 | Discharge: 2018-11-10 | Disposition: A | Discharge status: Home / Self Care | Payer: Medicare Other | Source: Ambulatory Visit | Attending: Internal Medicine | Admitting: Internal Medicine

## 2018-11-10 ENCOUNTER — Other Ambulatory Visit: Payer: Self-pay

## 2018-11-10 DIAGNOSIS — K760 Fatty (change of) liver, not elsewhere classified: Secondary | ICD-10-CM | POA: Diagnosis not present

## 2018-11-10 DIAGNOSIS — K8020 Calculus of gallbladder without cholecystitis without obstruction: Secondary | ICD-10-CM | POA: Insufficient documentation

## 2018-11-30 ENCOUNTER — Other Ambulatory Visit: Payer: Self-pay

## 2018-11-30 ENCOUNTER — Encounter: Payer: Self-pay | Admitting: Cardiovascular Disease

## 2018-11-30 ENCOUNTER — Ambulatory Visit (INDEPENDENT_AMBULATORY_CARE_PROVIDER_SITE_OTHER): Payer: Medicare Other | Admitting: Cardiovascular Disease

## 2018-11-30 VITALS — BP 110/60 | HR 74 | Temp 96.6°F | Ht 63.0 in | Wt 166.8 lb

## 2018-11-30 DIAGNOSIS — E785 Hyperlipidemia, unspecified: Secondary | ICD-10-CM

## 2018-11-30 DIAGNOSIS — Z720 Tobacco use: Secondary | ICD-10-CM

## 2018-11-30 DIAGNOSIS — R0602 Shortness of breath: Secondary | ICD-10-CM

## 2018-11-30 DIAGNOSIS — I739 Peripheral vascular disease, unspecified: Secondary | ICD-10-CM | POA: Diagnosis not present

## 2018-11-30 DIAGNOSIS — I10 Essential (primary) hypertension: Secondary | ICD-10-CM | POA: Diagnosis not present

## 2018-11-30 LAB — HEPATIC FUNCTION PANEL
ALT: 14 IU/L (ref 0–32)
AST: 25 IU/L (ref 0–40)
Albumin: 4.3 g/dL (ref 3.8–4.8)
Alkaline Phosphatase: 79 IU/L (ref 39–117)
Bilirubin Total: 0.3 mg/dL (ref 0.0–1.2)
Bilirubin, Direct: 0.15 mg/dL (ref 0.00–0.40)
Total Protein: 7.1 g/dL (ref 6.0–8.5)

## 2018-11-30 LAB — LIPID PANEL
Chol/HDL Ratio: 2.7 ratio (ref 0.0–4.4)
Cholesterol, Total: 83 mg/dL — ABNORMAL LOW (ref 100–199)
HDL: 31 mg/dL — ABNORMAL LOW (ref >39)
LDL Chol Calc (NIH): 32 mg/dL (ref 0–99)
Triglycerides: 109 mg/dL (ref 0–149)
VLDL Cholesterol Cal: 20 mg/dL (ref 5–40)

## 2018-11-30 NOTE — Patient Instructions (Addendum)
Medication Instructions:  Your physician recommends that you continue on your current medications as directed. Please refer to the Current Medication list given to you today.  If you need a refill on your cardiac medications before your next appointment, please call your pharmacy.   Lab work: Your provider would like for you to have the following labs today: lipid and liver  If you have labs (blood work) drawn today and your tests are completely normal, you will receive your results only by: [place] (if you have MyChart) OR A paper copy in the mail If you have any lab test that is abnormal or we need to change your treatment, we will call you to review the results.  Testing/Procedures: Your physician has requested that you have an echocardiogram. Echocardiography is a painless test that uses sound waves to create images of your heart. It provides your doctor with information about the size and shape of your heart and how well your heart's chambers and valves are working. You may receive an ultrasound enhancing agent through an IV if needed to better visualize your heart during the echo.This procedure takes approximately one hour. There are no restrictions for this procedure. This will take place at the [address].    Follow-Up: At [facility], you and your health needs are our priority.  As part of our continuing mission to provide you with exceptional heart care, we have created designated Provider Care Teams.  These Care Teams include your primary Cardiologist (physician) and Advanced Practice Providers (APPs -  Physician Assistants and Nurse Practitioners) who all work together to provide you with the care you need, when you need it. You will need a follow up appointment in 6 months.  Please call our office 2 months in advance to schedule this appointment.  You may see [name], MD or one of the following Advanced Practice Providers on your designated Care  Team: Almyra Deforest, [place] Fabian Sharp, [place]   Steps to Quit Smoking Smoking tobacco is the leading cause of preventable death. It can affect almost every organ in the body. Smoking puts you and people around you at risk for many serious, long-lasting (chronic) diseases. Quitting smoking can be hard, but it is one of the best things that you can do for your health. It is never too late to quit. How do I get ready to quit? When you decide to quit smoking, make a plan to help you succeed. Before you quit:  Pick a date to quit. Set a date within the next 2 weeks to give you time to prepare.  Write down the reasons why you are quitting. Keep this list in places where you will see it often.  Tell your family, friends, and co-workers that you are quitting. Their support is important.  Talk with your doctor about the choices that may help you quit.  Find out if your health insurance will pay for these treatments.  Know the people, places, things, and activities that make you want to smoke (triggers). Avoid them. What first steps can I take to quit smoking?  Throw away all cigarettes at home, at work, and in your car.  Throw away the things that you use when you smoke, such as ashtrays and lighters.  Clean your car. Make sure to empty the ashtray.  Clean your home, including curtains and carpets. What can I do to help me quit smoking? Talk with your doctor about taking medicines and seeing a  counselor at the same time. You are more likely to succeed when you do both.  If you are pregnant or breastfeeding, talk with your doctor about counseling or other ways to quit smoking. Do not take medicine to help you quit smoking unless your doctor tells you to do so. To quit smoking: Quit right away  Quit smoking totally, instead of slowly cutting back on how much you smoke over a period of time.  Go to counseling. You are more likely to quit if you go to counseling sessions regularly. Take  medicine You may take medicines to help you quit. Some medicines need a prescription, and some you can buy over-the-counter. Some medicines may contain a drug called nicotine to replace the nicotine in cigarettes. Medicines may:  Help you to stop having the desire to smoke (cravings).  Help to stop the problems that come when you stop smoking (withdrawal symptoms). Your doctor may ask you to use:  Nicotine patches, gum, or lozenges.  Nicotine inhalers or sprays.  Non-nicotine medicine that is taken by mouth. Find resources Find resources and other ways to help you quit smoking and remain smoke-free after you quit. These resources are most helpful when you use them often. They include:  Online chats with a Social worker.  Phone quitlines.  Printed Furniture conservator/restorer.  Support groups or group counseling.  Text messaging programs.  Mobile phone apps. Use apps on your mobile phone or tablet that can help you stick to your quit plan. There are many free apps for mobile phones and tablets as well as websites. Examples include Quit Guide from the [employer] and smokefree.gov  What things can I do to make it easier to quit?   Talk to your family and friends. Ask them to support and encourage you.  Call a phone quitline (1-800-QUIT-NOW), reach out to support groups, or work with a Social worker.  Ask people who smoke to not smoke around you.  Avoid places that make you want to smoke, such as: ? Bars. ? Parties. ? Smoke-break areas at work.  Spend time with people who do not smoke.  Lower the stress in your life. Stress can make you want to smoke. Try these things to help your stress: ? Getting regular exercise. ? Doing deep-breathing exercises. ? Doing yoga. ? Meditating. ? Doing a body scan. To do this, close your eyes, focus on one area of your body at a time from head to toe. Notice which parts of your body are tense. Try to relax the muscles in those areas. How will I feel when I quit  smoking? Day 1 to 3 weeks Within the first 24 hours, you may start to have some problems that come from quitting tobacco. These problems are very bad 2-3 days after you quit, but they do not often last for more than 2-3 weeks. You may get these symptoms:  Mood swings.  Feeling restless, nervous, angry, or annoyed.  Trouble concentrating.  Dizziness.  Strong desire for high-sugar foods and nicotine.  Weight gain.  Trouble pooping (constipation).  Feeling like you may vomit (nausea).  Coughing or a sore throat.  Changes in how the medicines that you take for other issues work in your body.  Depression.  Trouble sleeping (insomnia). Week 3 and afterward After the first 2-3 weeks of quitting, you may start to notice more positive results, such as:  Better sense of smell and taste.  Less coughing and sore throat.  Slower heart rate.  Lower blood pressure.  Clearer skin.  Better breathing.  Fewer sick days. Quitting smoking can be hard. Do not give up if you fail the first time. Some people need to try a few times before they succeed. Do your best to stick to your quit plan, and talk with your doctor if you have any questions or concerns. Summary  Smoking tobacco is the leading cause of preventable death. Quitting smoking can be hard, but it is one of the best things that you can do for your health.  When you decide to quit smoking, make a plan to help you succeed.  Quit smoking right away, not slowly over a period of time.  When you start quitting, seek help from your doctor, family, or friends. This information is not intended to replace advice given to you by your health care provider. Make sure you discuss any questions you have with your health care provider. Document Released: 01/11/2009 Document Revised: 06/04/2018 Document Reviewed: 06/05/2018 Elsevier Patient Education  2020 [employer].

## 2018-11-30 NOTE — Progress Notes (Signed)
Cardiology Office Note   Date:  11/30/2018   ID:  Memoree, Ravis 1955/12/03, MRN [id number]  PCP:  [name], MD  Cardiologist:   [name], MD   No chief complaint on file.     History of Present Illness: Rose [name] is a 63 y.o. female who is here today for follow-up visit regarding peripheral arterial disease.  The patient has multiple chronic medical conditions that include type 2 diabetes, essential hypertension, hyperlipidemia, remote IV drug use and tobacco use.  She had cardiac work-up in 2018 for exertional dyspnea and atypical chest pain. A pharmacologic nuclear stress test was negative for ischemia.  Echocardiogram was also unremarkable with normal ejection fraction and grade 1 diastolic dysfunction. The patient has known history of nonobstructive peripheral arterial disease.  Noninvasive vascular studies in April 2019 showed normal ABI bilaterally.  Duplex showed mild right SFA disease and moderate left popliteal artery stenosis. She had recurrent falls recently and fractured left rib.  She continues to complain of atypical leg pain starting at the hips especially on the right side.  She has numbness in both feet as well.  She cut down on tobacco use to 4 cigarettes a day.  She had recent worsening of exertional dyspnea and leg edema.  BNP was mildly elevated at 119.  Past Medical History:  Diagnosis Date  . Anxiety   . Arthritis   . Asthma   . Chronic back pain   . Depression   . Diabetes mellitus   . Frequency of urination   . Hep C w/o coma, chronic ([place])   . History of chronic bronchitis   . History of drug abuse ([place]) states quit herion in 2006  . Hypertension   . Methadone maintenance therapy patient ([place])   . Nocturia   . Tendonitis   . Urge urinary incontinence     Past Surgical History:  Procedure Laterality Date  . APPENDECTOMY  1972  . INTERSTIM IMPLANT PLACEMENT  10/23/2011   Procedure: Barrie Lyme IMPLANT FIRST STAGE;  Surgeon:  Reece Packer, MD;  Location: [facility];  Service: Urology;  Laterality: N/A;  . INTERSTIM IMPLANT PLACEMENT  10/23/2011   Procedure: Barrie Lyme IMPLANT SECOND STAGE;  Surgeon: Reece Packer, MD;  Location: [facility];  Service: Urology;  Laterality: N/A;     Current Outpatient Medications  Medication Sig Dispense Refill  . albuterol (PROVENTIL HFA;VENTOLIN HFA) 108 (90 Base) MCG/ACT inhaler Inhale 2 puffs into the lungs every 6 (six) hours as needed. For wheezing 1 Inhaler 2  . aspirin EC 81 MG tablet Take 1 tablet (81 mg total) by mouth daily.    [name] atenolol (TENORMIN) 50 MG tablet Take 0.5 tablets (25 mg total) by mouth daily. TAKE 1/2 TABLET(25 MG) BY MOUTH DAILY (Patient taking differently: Take 25 mg by mouth daily. ) 45 tablet 1  . atorvastatin (LIPITOR) 40 MG tablet Take 1 tablet (40 mg total) by mouth daily. 90 tablet 3  . buPROPion (WELLBUTRIN) 75 MG tablet Take 75 mg by mouth daily.    . capsicum oleoresin (TRIXAICIN) 0.025 % cream Apply 1 application topically 3 (three) times daily. (Patient taking differently: Apply 1 application topically as needed (pain). ) 56.6 g 0  . cetirizine (ZYRTEC) 10 MG tablet Take 1 tablet (10 mg total) by mouth daily. 90 tablet 1  . citalopram (CELEXA) 20 MG tablet Take 20 mg by mouth daily.    . diclofenac sodium (VOLTAREN) 1 % GEL  Apply 2 g topically 4 (four) times daily as needed (pain). 150 g 1  . DULoxetine (CYMBALTA) 60 MG capsule TAKE 1 CAPSULE(60 MG) BY MOUTH DAILY (Patient taking differently: Take 60 mg by mouth daily. TAKE 1 CAPSULE(60 MG) BY MOUTH DAILY) 90 capsule 3  . fluticasone (FLONASE) 50 MCG/ACT nasal spray SHAKE LIQUID AND USE 1 SPRAY IN EACH NOSTRIL DAILY 16 g 5  . hydrochlorothiazide (HYDRODIURIL) 25 MG tablet TAKE 1 TABLET(25 MG) BY MOUTH DAILY (Patient taking differently: Take 25 mg by mouth daily. ) 90 tablet 1  . lidocaine (XYLOCAINE) 5 % ointment Apply 1 application topically as needed.  (Patient taking differently: Apply 1 application topically daily as needed for mild pain. ) 50 g 0  . loratadine (CLARITIN) 10 MG tablet Take 1 tablet (10 mg total) by mouth daily. 30 tablet 11  . metFORMIN (GLUCOPHAGE) 500 MG tablet Take 1 tablet (500 mg total) by mouth 2 (two) times daily with a meal. 180 tablet 1  . methadone (DOLOPHINE) 10 MG/5ML solution Take 100 mg by mouth daily.    . Multiple Vitamins-Minerals (ONE-A-DAY WOMENS 50+ ADVANTAGE) TABS Take 1 tablet by mouth daily.     . nicotine (NICODERM CQ - DOSED IN MG/24 HOURS) 14 mg/24hr patch Medicaid [id number] L Wks 1-6: 14 mg x 1 patch daily. Wear for 24 hours. If you have sleep disturbances, remove at bedtime. 42 patch 0  . tiZANidine (ZANAFLEX) 4 MG tablet Take 1 tablet (4 mg total) by mouth every 8 (eight) hours as needed for muscle spasms. 90 tablet 1  . traZODone (DESYREL) 100 MG tablet Take 100 mg by mouth at bedtime as needed for sleep.   2  . naproxen sodium (ALEVE) 220 MG tablet Take 220 mg by mouth 2 (two) times daily as needed (pain).      No current facility-administered medications for this visit.     Allergies:   Lisinopril, Gabapentin, and Vicodin [hydrocodone-acetaminophen]    Social History:  The patient  reports that she has been smoking cigarettes. She has a 10.00 pack-year smoking history. She has never used smokeless tobacco. She reports that she does not drink alcohol or use drugs.   Family History:  The patient's family history includes CAD in her father; Diabetes type II in her brother, mother, and sister; Heart attack (age of onset: 54) in her brother; Heart attack (age of onset: 32) in her father and mother; Hypertension in her brother and mother.    ROS:  Please see the history of present illness.   Otherwise, review of systems are positive for none.   All other systems are reviewed and negative.    PHYSICAL EXAM: VS:  BP 110/60   Pulse 74   Temp (!) 96.6 F (35.9 C)   Ht 5\' 3"  (1.6 m)   Wt 166 lb  12.8 oz (75.7 kg)   LMP 07/31/2011   SpO2 99%   BMI 29.55 kg/m  , BMI Body mass index is 29.55 kg/m. GEN: Well nourished, well developed, in no acute distress  HEENT: normal  Neck: no JVD, carotid bruits, or masses Cardiac: RRR; no murmurs, rubs, or gallops, mild bilateral leg edema Respiratory:  clear to auscultation bilaterally, normal work of breathing GI: soft, nontender, nondistended, + BS MS: no deformity or atrophy  Skin: warm and dry, no rash Neuro:  Strength and sensation are intact Psych: euthymic mood, full affect Vascular: Femoral pulses are normal bilaterally.  Scarring on the right leg from previous IV drug  use   EKG:  EKG  ordered today. EKG showed normal sinus rhythm with poor R wave progression in the anterior leads.   Recent Labs: 09/08/2018: BUN 13; Creatinine, Ser 1.18; Potassium 4.2; Sodium 142 10/28/2018: B Natriuretic Peptide 119.3    Lipid Panel    Component Value Date/Time   CHOL 161 12/12/2015 1122   TRIG 159 (H) 12/12/2015 1122   HDL 35 (L) 12/12/2015 1122   CHOLHDL 4.6 (H) 12/12/2015 1122   LDLCALC 94 12/12/2015 1122      Wt Readings from Last 3 Encounters:  11/30/18 166 lb 12.8 oz (75.7 kg)  10/28/18 168 lb 11.2 oz (76.5 kg)  09/08/18 167 lb 12.8 oz (76.1 kg)        PAD Screen 12/19/2016  Previous PAD dx? No  Previous surgical procedure? No  Pain with walking? No  Feet/toe relief with dangling? Yes  Painful, non-healing ulcers? No  Extremities discolored? No      ASSESSMENT AND PLAN:  1.  Peripheral arterial disease: Vascular studies last year showed  normal ABI and duplex showed overall mild to moderate nonobstructive disease.  No evidence of critical limb ischemia and still with atypical leg pain.  Continue aggressive treatment of risk factors.  2.  Tobacco use: She cut down on tobacco use but has not quit completely.  I discussed the importance of complete cessation.  3.  Hyperlipidemia: Continue treatment with atorvastatin  with a target LDL of less than 70.  I requested lipid and liver profile.  4.  Essential hypertension: Blood pressure is reasonably controlled on current medications.  5.  Recent worsening of dyspnea and leg edema: Mildly elevated BNP at 119.  No clear evidence of heart failure by exam.  I requested an echocardiogram.    Disposition:   FU with me in 6 months  Signed,  [name], MD  11/30/2018 8:51 AM    [place]

## 2018-12-03 ENCOUNTER — Other Ambulatory Visit: Payer: Self-pay | Admitting: *Deleted

## 2018-12-03 DIAGNOSIS — E785 Hyperlipidemia, unspecified: Secondary | ICD-10-CM

## 2018-12-03 DIAGNOSIS — E1169 Type 2 diabetes mellitus with other specified complication: Secondary | ICD-10-CM

## 2018-12-07 MED ORDER — ATORVASTATIN CALCIUM 40 MG PO TABS: 40.0000 mg | ORAL_TABLET | Freq: Every day | ORAL | 3 refills | Status: DC

## 2018-12-08 ENCOUNTER — Other Ambulatory Visit (HOSPITAL_COMMUNITY): Payer: Medicare Other

## 2018-12-14 ENCOUNTER — Encounter (INDEPENDENT_AMBULATORY_CARE_PROVIDER_SITE_OTHER): Payer: Self-pay

## 2018-12-14 ENCOUNTER — Ambulatory Visit (HOSPITAL_COMMUNITY): Payer: Medicare Other | Attending: Cardiology

## 2018-12-14 ENCOUNTER — Other Ambulatory Visit: Payer: Self-pay

## 2018-12-14 DIAGNOSIS — I10 Essential (primary) hypertension: Secondary | ICD-10-CM | POA: Insufficient documentation

## 2018-12-14 DIAGNOSIS — R0602 Shortness of breath: Secondary | ICD-10-CM | POA: Diagnosis not present

## 2018-12-14 NOTE — Progress Notes (Signed)
Patient called.  Patient aware.  

## 2018-12-17 ENCOUNTER — Telehealth (HOSPITAL_COMMUNITY): Payer: Self-pay

## 2018-12-17 NOTE — Telephone Encounter (Signed)
New message    Just an FYI. We have made several attempts to contact this patient including sending a letter to schedule or reschedule their echocardiogram. We will be removing the patient from the echo WQ.   9.10.20 mail reminder letter Janneth Krasner  8.27.20 @ 10:33am lm on home Granvil Djordjevic 7.31.20 @ 11:36am lm on home vm - Jariel Drost

## 2018-12-30 ENCOUNTER — Other Ambulatory Visit: Payer: Self-pay | Admitting: Internal Medicine

## 2018-12-30 DIAGNOSIS — K8020 Calculus of gallbladder without cholecystitis without obstruction: Secondary | ICD-10-CM

## 2018-12-30 NOTE — Progress Notes (Signed)
Ordering non contrast abdominal CT scan for further evaluation of gallbladder (to r/u porceline gallbladder) I called patient and notified her. She agrees with that.   Of note, she also mentions, she thinks she has some ear or sinus infection. I encouraged her to call [facility] tomorrow AM to get an appointment at [facility]. She is going to do so.

## 2019-01-04 ENCOUNTER — Other Ambulatory Visit: Payer: Self-pay | Admitting: *Deleted

## 2019-01-04 ENCOUNTER — Other Ambulatory Visit: Payer: Self-pay

## 2019-01-04 ENCOUNTER — Ambulatory Visit (INDEPENDENT_AMBULATORY_CARE_PROVIDER_SITE_OTHER): Payer: Medicare Other | Admitting: Internal Medicine

## 2019-01-04 ENCOUNTER — Encounter: Payer: Self-pay | Admitting: Internal Medicine

## 2019-01-04 DIAGNOSIS — Z23 Encounter for immunization: Secondary | ICD-10-CM

## 2019-01-04 DIAGNOSIS — Z79899 Other long term (current) drug therapy: Secondary | ICD-10-CM

## 2019-01-04 DIAGNOSIS — R0989 Other specified symptoms and signs involving the circulatory and respiratory systems: Secondary | ICD-10-CM

## 2019-01-04 DIAGNOSIS — I10 Essential (primary) hypertension: Secondary | ICD-10-CM

## 2019-01-04 DIAGNOSIS — M62838 Other muscle spasm: Secondary | ICD-10-CM

## 2019-01-04 DIAGNOSIS — J309 Allergic rhinitis, unspecified: Secondary | ICD-10-CM | POA: Diagnosis not present

## 2019-01-04 DIAGNOSIS — F17200 Nicotine dependence, unspecified, uncomplicated: Secondary | ICD-10-CM

## 2019-01-04 DIAGNOSIS — Z720 Tobacco use: Secondary | ICD-10-CM

## 2019-01-04 MED ORDER — CETIRIZINE HCL 10 MG PO TABS: 10.0000 mg | ORAL_TABLET | Freq: Every day | ORAL | 1 refills | Status: DC

## 2019-01-04 MED ORDER — FLUTICASONE PROPIONATE 50 MCG/ACT NA SUSP: NASAL | 5 refills | Status: DC

## 2019-01-04 MED ORDER — NICOTINE 14 MG/24HR TD PT24: MEDICATED_PATCH | TRANSDERMAL | 0 refills | Status: DC

## 2019-01-04 NOTE — Assessment & Plan Note (Signed)
Patent reports history of allergic rhinitis , but insurance has stopped covering Cetirizine. She bought an over the counter cold medication, but can't remember the name. Continues to have symptoms. RN called pharmacy and they will cover Cetirizine now. Denies any cough , fever, or chills.   Assessment:Patient presents with seasonal allergies.  Plan  - Cetirizine  - Flonase - Saline rinses

## 2019-01-04 NOTE — Patient Instructions (Addendum)
  Thank you for trusting me with your care. To recap, today we discussed the following:   Seasonal Allergies - Start Cetirizine 10 mg once daily   - Flonase 2 sprays per nostril , 2 times a day for 3 days. Then 2 sprays per nostril 2 times a day .  Tobacco use - Sent in nicotine patches to your pharmacy - Continue cutting back , I believe you can quit!  Shortness of breath - I believe if we control your allergies it will help with your breathing - We discussed the best thing for your breathing is quitting smoking cigarettes.  - Your oxygen saturation was 100% today, but I will recommend to your PCP possible pulmonary function test in the future if you continue to have difficulty breathing.   My best,  Tamsen Snider , MD

## 2019-01-04 NOTE — Progress Notes (Signed)
   CC: allergies and sinus pain  HPI:Rose [name] is a 63 y.o. with past medical history below. She presented with allergies and sinus pain. Please see problem based charting for details of presentation, assessment, and plan.  Past Medical History:  Diagnosis Date  . Anxiety   . Arthritis   . Asthma   . Chronic back pain   . Depression   . Diabetes mellitus   . Frequency of urination   . Hep C w/o coma, chronic ([place])   . History of chronic bronchitis   . History of drug abuse ([place]) states quit herion in 2006  . Hypertension   . Methadone maintenance therapy patient ([place])   . Nocturia   . Tendonitis   . Urge urinary incontinence    Review of Systems: Review of Systems  Constitutional: Negative for chills, fever and malaise/fatigue.  HENT: Positive for congestion and sinus pain. Negative for sore throat.     Vitals:   01/04/19 0928  BP: (!) 128/57  Pulse: 77  Temp: 98.5 F (36.9 C)  TempSrc: Oral  SpO2: 100%  Weight: 171 lb (77.6 kg)  Height: 5\' 3"  (1.6 m)   Physical Exam: Physical Exam Constitutional:      Appearance: Normal appearance.  HENT:     Right Ear: Tympanic membrane and ear canal normal.     Left Ear: Tympanic membrane and ear canal normal.     Nose: Congestion present. No rhinorrhea.     Mouth/Throat:     Mouth: Mucous membranes are moist.     Pharynx: No oropharyngeal exudate or posterior oropharyngeal erythema.  Cardiovascular:     Rate and Rhythm: Normal rate and regular rhythm.  Pulmonary:     Breath sounds: Rhonchi and rales (rales in lower lung fields) present. No wheezing.  Abdominal:     General: Bowel sounds are normal.     Tenderness: There is no abdominal tenderness.  Musculoskeletal:     Right lower leg: No edema.     Left lower leg: No edema.  Skin:    General: Skin is warm and dry.  Neurological:     Mental Status: She is alert.      Assessment & Plan:   See Encounters Tab for problem based charting.  Patient seen  with Dr. Rebeca Alert

## 2019-01-04 NOTE — Progress Notes (Signed)
Internal Medicine Clinic Attending  I saw and evaluated the patient.  I personally confirmed the key portions of the history and exam documented by Dr. Steen and I reviewed pertinent patient test results.  The assessment, diagnosis, and plan were formulated together and I agree with the documentation in the resident's note.  Alexander Raines, M.D., Ph.D.  

## 2019-01-04 NOTE — Assessment & Plan Note (Signed)
Reports cutting back from a pack a day to 6 cigarettes a day. She is not ready to quit, but continues to work on cutting back - Nicotine patch

## 2019-01-07 MED ORDER — TIZANIDINE HCL 4 MG PO TABS: 4.0000 mg | ORAL_TABLET | Freq: Three times a day (TID) | ORAL | 0 refills | Status: DC | PRN

## 2019-01-07 MED ORDER — HYDROCHLOROTHIAZIDE 25 MG PO TABS: ORAL_TABLET | ORAL | 1 refills | Status: DC

## 2019-01-11 ENCOUNTER — Ambulatory Visit (HOSPITAL_COMMUNITY): Payer: Medicare Other | Attending: Internal Medicine

## 2019-01-27 ENCOUNTER — Ambulatory Visit: Payer: Medicare Other | Admitting: Podiatry

## 2019-01-30 ENCOUNTER — Other Ambulatory Visit: Payer: Self-pay | Admitting: Internal Medicine

## 2019-01-30 DIAGNOSIS — M62838 Other muscle spasm: Secondary | ICD-10-CM

## 2019-02-01 NOTE — Telephone Encounter (Signed)
Started in March 2019 for back pain.  Has been continued since then.  Not clear frequency that she is taking it.  Will refill and asked that she get scheduled for PCP appointment in December or January to follow-up tizanidine and diabetes.

## 2019-02-06 ENCOUNTER — Other Ambulatory Visit: Payer: Self-pay | Admitting: Pharmacist

## 2019-02-06 DIAGNOSIS — Z794 Long term (current) use of insulin: Secondary | ICD-10-CM

## 2019-02-06 DIAGNOSIS — E118 Type 2 diabetes mellitus with unspecified complications: Secondary | ICD-10-CM

## 2019-02-06 MED ORDER — METFORMIN HCL 500 MG PO TABS: 500.0000 mg | ORAL_TABLET | Freq: Two times a day (BID) | ORAL | 3 refills | Status: DC

## 2019-03-03 ENCOUNTER — Encounter: Payer: Medicare Other | Admitting: Internal Medicine

## 2019-03-03 ENCOUNTER — Ambulatory Visit (INDEPENDENT_AMBULATORY_CARE_PROVIDER_SITE_OTHER): Payer: Medicare Other | Admitting: Internal Medicine

## 2019-03-03 VITALS — BP 127/73 | HR 70 | Temp 98.7°F | Ht 63.0 in | Wt 171.8 lb

## 2019-03-03 DIAGNOSIS — S91311A Laceration without foreign body, right foot, initial encounter: Secondary | ICD-10-CM

## 2019-03-03 DIAGNOSIS — E118 Type 2 diabetes mellitus with unspecified complications: Secondary | ICD-10-CM

## 2019-03-03 DIAGNOSIS — Z794 Long term (current) use of insulin: Secondary | ICD-10-CM

## 2019-03-03 DIAGNOSIS — I10 Essential (primary) hypertension: Secondary | ICD-10-CM

## 2019-03-03 DIAGNOSIS — Z7984 Long term (current) use of oral hypoglycemic drugs: Secondary | ICD-10-CM

## 2019-03-03 DIAGNOSIS — Z79899 Other long term (current) drug therapy: Secondary | ICD-10-CM

## 2019-03-03 DIAGNOSIS — X58XXXA Exposure to other specified factors, initial encounter: Secondary | ICD-10-CM | POA: Diagnosis not present

## 2019-03-03 DIAGNOSIS — S91114A Laceration without foreign body of right lesser toe(s) without damage to nail, initial encounter: Secondary | ICD-10-CM

## 2019-03-03 DIAGNOSIS — E1169 Type 2 diabetes mellitus with other specified complication: Secondary | ICD-10-CM

## 2019-03-03 DIAGNOSIS — F17210 Nicotine dependence, cigarettes, uncomplicated: Secondary | ICD-10-CM

## 2019-03-03 DIAGNOSIS — E119 Type 2 diabetes mellitus without complications: Secondary | ICD-10-CM

## 2019-03-03 LAB — GLUCOSE, CAPILLARY: Glucose-Capillary: 212 mg/dL — ABNORMAL HIGH (ref 70–99)

## 2019-03-03 LAB — POCT GLYCOSYLATED HEMOGLOBIN (HGB A1C): Hemoglobin A1C: 6.6 % — AB (ref 4.0–5.6)

## 2019-03-03 NOTE — Patient Instructions (Signed)
[name],  It was a pleasure to see you today. Thank you for coming in.   Today we discussed your foot issue. This does not appear infect, please make an appointment with podiatry to evaluate this. We will hold off on any medications at this time. If you develop fevers, chills, nausea, vomiting, worsening pain, or spreading of the foot issue please contact us or Podiatry.    We also discussed your blood pressure. This looks great today! Please continue taking your medications for now.   Please return to clinic as needed.   Thank you again for coming in.   Asencion Noble.D.

## 2019-03-03 NOTE — Progress Notes (Signed)
CC: Hypertension, diabetes, and right foot cut  HPI:  [name] is a 63 y.o.  with a PMH listed below presenting for hypertension, diabetes, and right foot cut.  Please see A&P for status of the patient's chronic medical conditions  Past Medical History:  Diagnosis Date  . Anxiety   . Arthritis   . Asthma   . Chronic back pain   . Depression   . Diabetes mellitus   . Frequency of urination   . Hep C w/o coma, chronic ([place])   . History of chronic bronchitis   . History of drug abuse ([place]) states quit herion in 2006  . Hypertension   . Methadone maintenance therapy patient ([place])   . Nocturia   . Tendonitis   . Urge urinary incontinence    Review of Systems: Refer to history of present illness and assessment and plans for pertinent review of systems, all others reviewed and negative.  Physical Exam:  Vitals:   03/03/19 1520  BP: (!) 154/62  Pulse: 73  Temp: 98.7 F (37.1 C)  TempSrc: Oral  SpO2: 100%  Weight: 171 lb 12.8 oz (77.9 kg)  Height: 5\' 3"  (1.6 m)   Physical Exam  Constitutional: She is oriented to person, place, and time and well-developed, well-nourished, and in no distress.  Cardiovascular: Normal rate, regular rhythm and normal heart sounds.  Pulmonary/Chest: Effort normal and breath sounds normal. No respiratory distress.  Abdominal: Soft. Bowel sounds are normal. She exhibits no distension.  Musculoskeletal: Normal range of motion.        General: No edema.  Neurological: She is alert and oriented to person, place, and time.  Skin:  Right lateral aspect of right foot showed a non-odourous, approximately 2 cm, granulating wound with no drainage, mild TTP. No erythema noted around area.   Psychiatric: Mood and affect normal.       Social History   Socioeconomic History  . Marital status: Single    Spouse name: Not on file  . Number of children: Not on file  . Years of education: Not on file  . Highest education level: Not on file   Occupational History  . Not on file  Social Needs  . Financial resource strain: Not on file  . Food insecurity    Worry: Not on file    Inability: Not on file  . Transportation needs    Medical: Not on file    Non-medical: Not on file  Tobacco Use  . Smoking status: Current Every Day Smoker    Packs/day: 0.50    Years: 20.00    Pack years: 10.00    Types: Cigarettes  . Smokeless tobacco: Never Used  . Tobacco comment: 5 cigs per day   Substance and Sexual Activity  . Alcohol use: No    Alcohol/week: 0.0 standard drinks  . Drug use: No  . Sexual activity: Never  Lifestyle  . Physical activity    Days per week: Not on file    Minutes per session: Not on file  . Stress: Not on file  Relationships  . Social Herbalist on phone: Not on file    Gets together: Not on file    Attends religious service: Not on file    Active member of club or organization: Not on file    Attends meetings of clubs or organizations: Not on file    Relationship status: Not on file  . Intimate partner violence  Fear of current or ex partner: Not on file    Emotionally abused: Not on file    Physically abused: Not on file    Forced sexual activity: Not on file  Other Topics Concern  . Not on file  Social History Narrative  . Not on file    Family History  Problem Relation Age of Onset  . Hypertension Mother   . Diabetes type II Mother   . Heart attack Mother 59  . CAD Father   . Heart attack Father 75  . Hypertension Brother   . Diabetes type II Brother   . Heart attack Brother 72  . Diabetes type II Sister     Assessment & Plan:   See Encounters Tab for problem based charting.  Patient discussed with Dr. Philipp Ovens

## 2019-03-04 DIAGNOSIS — S91311A Laceration without foreign body, right foot, initial encounter: Secondary | ICD-10-CM | POA: Insufficient documentation

## 2019-03-04 HISTORY — DX: Laceration without foreign body, right foot, initial encounter: S91.311A

## 2019-03-04 NOTE — Assessment & Plan Note (Signed)
Patient is currently on HCTZ 25 mg daily and atenolol 50 mg daily. Blood pressure today is 154/62, repeat was 127/73.  Denies any issues with taking her medications.

## 2019-03-04 NOTE — Assessment & Plan Note (Signed)
Patient is currently on Metformin 500 mg daily, last A1c in June/2020 was 6.5.  Denies any issues taking any of her medications.  When she does check her blood sugars it is normally around 90-100.  Seems to be well-controlled on her current regimen however given her recent laceration will repeat A1c today to ensure good control.  -Repeat A1c today -Continue Metformin 500 mg daily

## 2019-03-04 NOTE — Assessment & Plan Note (Addendum)
Patient reports that about 2 weeks ago she noticed she noticed a cut in her right lateral foot, she is unsure of exactly when this occurred or how this occurred.  She does have a decent amount of calluses and reports picking at it this time. She reports that the area has started worsening and the wound started getting little bit bigger, she reports that it is very sore however is able to ambulate on it.  She does follow with a podiatrist but recently missed her appointment due to the pandemic and when she tried to reschedule she get an appointment about 6 months later. She denies any fevers, chills, nausea, vomiting, change in appetite, abdominal pain, headaches, lightheadedness, dizziness, or any other systemic symptoms. She is afebrile with stable vital signs. On exam she is a small nonodorous, nondraining, open wound in her right lateral aspect, see picture.  She does have a history of neuropathy which she is on no medications at this time, does have decreased sensation in bilateral lower extremities.  Overall this does not appear infected at this time and will hold off on any antibiotics.  Would recommend follow-up with her podiatrist.  Advised patient that if symptoms worsen, she develops fevers, chills, or any other systemic symptoms, to contact us right away.    -Follow-up with podiatry

## 2019-03-08 NOTE — Progress Notes (Signed)
Internal Medicine Clinic Attending  Case discussed with Dr. Krienke at the time of the visit.  We reviewed the resident's history and exam and pertinent patient test results.  I agree with the assessment, diagnosis, and plan of care documented in the resident's note.    

## 2019-04-04 ENCOUNTER — Other Ambulatory Visit: Payer: Self-pay | Admitting: *Deleted

## 2019-04-04 ENCOUNTER — Encounter: Payer: Self-pay | Admitting: Internal Medicine

## 2019-04-04 ENCOUNTER — Encounter: Payer: Medicare Other | Admitting: Internal Medicine

## 2019-04-04 DIAGNOSIS — I5032 Chronic diastolic (congestive) heart failure: Secondary | ICD-10-CM

## 2019-04-04 DIAGNOSIS — G8929 Other chronic pain: Secondary | ICD-10-CM

## 2019-04-04 DIAGNOSIS — M545 Low back pain, unspecified: Secondary | ICD-10-CM

## 2019-04-04 MED ORDER — ATENOLOL 50 MG PO TABS: 25.0000 mg | ORAL_TABLET | Freq: Every day | ORAL | 1 refills | Status: DC

## 2019-04-04 MED ORDER — DULOXETINE HCL 60 MG PO CPEP: 60.0000 mg | ORAL_CAPSULE | Freq: Every day | ORAL | 2 refills | Status: DC

## 2019-04-08 ENCOUNTER — Encounter: Payer: Self-pay | Admitting: Podiatry

## 2019-04-08 ENCOUNTER — Ambulatory Visit (INDEPENDENT_AMBULATORY_CARE_PROVIDER_SITE_OTHER): Payer: Medicare Other

## 2019-04-08 ENCOUNTER — Ambulatory Visit (INDEPENDENT_AMBULATORY_CARE_PROVIDER_SITE_OTHER): Payer: Medicare Other | Admitting: Podiatry

## 2019-04-08 ENCOUNTER — Other Ambulatory Visit: Payer: Self-pay

## 2019-04-08 DIAGNOSIS — I739 Peripheral vascular disease, unspecified: Secondary | ICD-10-CM

## 2019-04-08 DIAGNOSIS — B351 Tinea unguium: Secondary | ICD-10-CM | POA: Diagnosis not present

## 2019-04-08 DIAGNOSIS — L97519 Non-pressure chronic ulcer of other part of right foot with unspecified severity: Secondary | ICD-10-CM | POA: Diagnosis not present

## 2019-04-08 DIAGNOSIS — M21621 Bunionette of right foot: Secondary | ICD-10-CM | POA: Diagnosis not present

## 2019-04-08 DIAGNOSIS — E1142 Type 2 diabetes mellitus with diabetic polyneuropathy: Secondary | ICD-10-CM

## 2019-04-08 MED ORDER — SILVER SULFADIAZINE 1 % EX CREA: TOPICAL_CREAM | CUTANEOUS | 0 refills | Status: DC

## 2019-04-08 NOTE — Progress Notes (Signed)
  Subjective:  Patient ID: Rose [name], female    DOB: [birth year],  MRN: [id number]  Chief Complaint  Patient presents with  . Nail Problem    trim my nails   . Foot Ulcer    i have a spot on the side of my 5th toe right and started out as a pimple like spot and does some draining     64 y.o. female presents for wound care. Hx confirmed with patient. Hx as above. Objective:  Physical Exam: Wound Location: Right 5th MPJ Wound Measurement: 1x2 pre-debridement Wound Base: Granular/Healthy Peri-wound: Calloused Exudate: None: wound tissue dry wound without warmth, erythema, signs of acute infection  Left DP pulse palpable 1/4 Right DP PT pulses non-palpable    Radiographs:  X-ray of the right foot: no fracture, dislocation, swelling or degenerative changes noted no osseous erosions Assessment:   1. Ulcer of right foot, unspecified ulcer stage ([place])   2. Onychomycosis of multiple toenails with type 2 diabetes mellitus and peripheral neuropathy ([place])   3. PAD (peripheral artery disease) ([place])   4. Tailor's bunion of right foot    Plan:  Patient was evaluated and treated and all questions answered.  Ulcer right 5th MPJ -XR reviewed with patient -Dressing applied consisting of silvadene, sterile gauze, kerlix and ACE bandage -Offload ulcer with surgical shoe -Surgical shoe dispensed -Wound cleansed and debrided  Procedure: Excisional Debridement of Wound Rationale: Removal of non-viable soft tissue from the wound to promote healing.  Anesthesia: none Pre-Debridement Wound Measurements: 1 cm x 2 cm x 0.2 cm  Post-Debridement Wound Measurements: 1.3 cm x 2.2 cm x 0.2 cm  Type of Debridement: Sharp Excisional Tissue Removed: Non-viable soft tissue Depth of Debridement: subcutaneous tissue. Technique: Sharp excisional debridement to bleeding, viable wound base.  Dressing: Dry, sterile, compression dressing. Disposition: Patient tolerated procedure well. Patient to return  in 1 week for follow-up.  Onychomycosis, PAD and DPN -Patient is diabetic with a qualifying condition for at risk foot care.  Procedure: Nail Debridement Rationale: Patient meets criteria for routine foot care due to PAD, DPN Type of Debridement: manual, sharp debridement. Instrumentation: Nail nipper, rotary burr. Number of Nails: 10   Return in about 2 weeks (around 04/22/2019) for Wound Care.  MDM Number of Diagnoses or Management Options Onychomycosis of multiple toenails with type 2 diabetes mellitus and peripheral neuropathy ([place]): minor PAD (peripheral artery disease) ([place]): new, needed workup Tailor's bunion of right foot: new, needed workup Ulcer of right foot, unspecified ulcer stage ([place]): new, needed workup

## 2019-04-11 ENCOUNTER — Telehealth: Payer: Self-pay | Admitting: *Deleted

## 2019-04-11 DIAGNOSIS — E0842 Diabetes mellitus due to underlying condition with diabetic polyneuropathy: Secondary | ICD-10-CM

## 2019-04-11 DIAGNOSIS — I739 Peripheral vascular disease, unspecified: Secondary | ICD-10-CM

## 2019-04-11 DIAGNOSIS — L97519 Non-pressure chronic ulcer of other part of right foot with unspecified severity: Secondary | ICD-10-CM

## 2019-04-11 DIAGNOSIS — E1142 Type 2 diabetes mellitus with diabetic polyneuropathy: Secondary | ICD-10-CM

## 2019-04-11 NOTE — Telephone Encounter (Signed)
-----   Message from Evelina Bucy, DPM sent at 04/08/2019  3:51 PM EST ----- Can we order non-invasive vascular studies.

## 2019-04-11 NOTE — Telephone Encounter (Signed)
Rose [name], "[place] MEDICAID MEMBER DOES NOT REQUIRE PRIOR AUTHORIZATION FOR OUTPATIENT RADIOLOGY THROUGH [facility] OR [facility] AT THIS TIME." FAXED ORDERS TO [facility].

## 2019-04-12 ENCOUNTER — Other Ambulatory Visit: Payer: Self-pay | Admitting: Podiatry

## 2019-04-12 DIAGNOSIS — L97519 Non-pressure chronic ulcer of other part of right foot with unspecified severity: Secondary | ICD-10-CM

## 2019-04-12 DIAGNOSIS — E0842 Diabetes mellitus due to underlying condition with diabetic polyneuropathy: Secondary | ICD-10-CM

## 2019-04-12 DIAGNOSIS — I739 Peripheral vascular disease, unspecified: Secondary | ICD-10-CM

## 2019-04-12 DIAGNOSIS — E1142 Type 2 diabetes mellitus with diabetic polyneuropathy: Secondary | ICD-10-CM

## 2019-04-14 ENCOUNTER — Other Ambulatory Visit: Payer: Self-pay | Admitting: Podiatry

## 2019-04-14 ENCOUNTER — Ambulatory Visit (HOSPITAL_COMMUNITY)
Admission: RE | Admit: 2019-04-14 | Discharge: 2019-04-14 | Disposition: A | Discharge status: Home / Self Care | Payer: Medicare Other | Source: Ambulatory Visit | Attending: Cardiology | Admitting: Cardiology

## 2019-04-14 ENCOUNTER — Other Ambulatory Visit: Payer: Self-pay

## 2019-04-14 DIAGNOSIS — E1142 Type 2 diabetes mellitus with diabetic polyneuropathy: Secondary | ICD-10-CM | POA: Insufficient documentation

## 2019-04-14 DIAGNOSIS — I739 Peripheral vascular disease, unspecified: Secondary | ICD-10-CM | POA: Diagnosis not present

## 2019-04-14 DIAGNOSIS — E0842 Diabetes mellitus due to underlying condition with diabetic polyneuropathy: Secondary | ICD-10-CM

## 2019-04-14 DIAGNOSIS — L97519 Non-pressure chronic ulcer of other part of right foot with unspecified severity: Secondary | ICD-10-CM | POA: Diagnosis not present

## 2019-04-14 NOTE — Progress Notes (Signed)
Vascular studies normal. Will discuss more next visit.

## 2019-04-22 ENCOUNTER — Other Ambulatory Visit: Payer: Self-pay

## 2019-04-22 ENCOUNTER — Ambulatory Visit (INDEPENDENT_AMBULATORY_CARE_PROVIDER_SITE_OTHER): Payer: Medicare Other | Admitting: Podiatry

## 2019-04-22 DIAGNOSIS — L97512 Non-pressure chronic ulcer of other part of right foot with fat layer exposed: Secondary | ICD-10-CM

## 2019-04-22 DIAGNOSIS — E08621 Diabetes mellitus due to underlying condition with foot ulcer: Secondary | ICD-10-CM | POA: Diagnosis not present

## 2019-04-22 NOTE — Progress Notes (Signed)
  Subjective:  Patient ID: Rose [name], female    DOB: Feb 26, 1956,  MRN: [id number]  Chief Complaint  Patient presents with  . Wound Check    R foot. Pt stated, "Better, but still hurts - 5-6/10 most of the time. No pus or foul odor. No fever/chills. AM glucose = 94"mg/dL.    64 y.o. female presents for wound care. Hx confirmed with patient. Hx as above. Objective:  Physical Exam: Wound Location: Right 5th MPJ Wound Measurement: 2x2 post-debridement Wound Base: Mixed Granular/Fibrotic Peri-wound: Calloused Exudate: None: wound tissue dry wound without warmth, erythema, signs of acute infection    Assessment:   1. Diabetic ulcer of other part of right foot associated with diabetes mellitus due to underlying condition, with fat layer exposed ([place])      Plan:  Patient was evaluated and treated and all questions answered.  Ulcer right foot -Dressing applied consisting of hydrogel and foam border dressing. -Offload ulcer with surgical shoe  Procedure: Excisional Debridement of Wound Rationale: Removal of non-viable soft tissue from the wound to promote healing.  Anesthesia: none Pre-Debridement Wound Measurements: 1.5 cm x 2 cm x 0.2 cm  Post-Debridement Wound Measurements: 2 cm x 2 cm x 0.2 cm  Type of Debridement: Sharp Excisional Tissue Removed: Non-viable soft tissue Depth of Debridement: subcutaneous tissue. Technique: Sharp excisional debridement to bleeding, viable wound base.  Dressing: Dry, sterile, compression dressing. Disposition: Patient tolerated procedure well. Patient to return in 1 week for follow-up.  Return in about 2 weeks (around 05/06/2019) for Wound Care, Right.

## 2019-05-06 ENCOUNTER — Other Ambulatory Visit: Payer: Self-pay

## 2019-05-06 ENCOUNTER — Telehealth: Payer: Self-pay | Admitting: *Deleted

## 2019-05-06 ENCOUNTER — Ambulatory Visit (INDEPENDENT_AMBULATORY_CARE_PROVIDER_SITE_OTHER): Payer: Medicare Other | Admitting: Podiatry

## 2019-05-06 DIAGNOSIS — E08621 Diabetes mellitus due to underlying condition with foot ulcer: Secondary | ICD-10-CM | POA: Diagnosis not present

## 2019-05-06 DIAGNOSIS — L97512 Non-pressure chronic ulcer of other part of right foot with fat layer exposed: Secondary | ICD-10-CM

## 2019-05-06 NOTE — Progress Notes (Signed)
  Subjective:  Patient ID: Rose [name], female    DOB: 06-11-1955,  MRN: [id number]  Chief Complaint  Patient presents with  . Wound Check    Right foot wound check. Pt states "It feels about the same to me". Denies fever/nausea/vomiting/chills.   64 y.o. female presents for wound care. Hx confirmed with patient.  Objective:  Physical Exam: Wound Location: Right 5th MPJ Wound Measurement: 2x2 Wound Base: Mixed Granular/Fibrotic Peri-wound: Macerated, Calloused Exudate: Scant/small amount Serosanguinous exudate wound without warmth, erythema, signs of acute infection  Assessment:   1. Diabetic ulcer of other part of right foot associated with diabetes mellitus due to underlying condition, with fat layer exposed ([place])    Plan:  Patient was evaluated and treated and all questions answered.  Ulcer Right 5th met -Dressing applied consisting of actisorb, sterile gauze, kerlix and ACE bandage -Offload ulcer with surgical shoe -Wound cleansed and debrided -Order Santyl given failure to make meaningful progress after >4 weeks -When wound fully granular and viable consider STSG or Skin graft substitute  Procedure: Selective Debridement of Wound Rationale: Removal of devitalized tissue from the wound to promote healing.  Pre-Debridement Wound Measurements: 2 cm x 2 cm x 0.2 cm  Post-Debridement Wound Measurements: same as pre-debridement. Type of Debridement: sharp selective Tissue Removed: Devitalized soft-tissue Dressing: Dry, sterile, compression dressing. Disposition: Patient tolerated procedure well. Patient to return in 1 week for follow-up.  Return in about 2 weeks (around 05/20/2019) for Wound Care.

## 2019-05-06 NOTE — Telephone Encounter (Signed)
Per Dr March Rummage sent a referral to get the patient santyl and faxed over today [phone].Rose [name]

## 2019-05-09 ENCOUNTER — Telehealth: Payer: Self-pay | Admitting: Podiatry

## 2019-05-09 NOTE — Telephone Encounter (Signed)
Left message informing pt the medication Santyl had been sent to a Specialty pharmacy and they would call with coverage and delivery information.

## 2019-05-09 NOTE — Telephone Encounter (Signed)
I have the orders and it was for santyl and I faxed over Friday. Lattie Haw

## 2019-05-09 NOTE — Telephone Encounter (Signed)
It wasn't an antibiotic it was santyl which Lattie Haw may have placed the order for not sure

## 2019-05-09 NOTE — Telephone Encounter (Signed)
Pt called to state that Dr.Price was to call in an antibiotic for her from her visit 05/06/19

## 2019-05-10 ENCOUNTER — Other Ambulatory Visit: Payer: Self-pay

## 2019-05-10 ENCOUNTER — Telehealth: Payer: Self-pay

## 2019-05-10 ENCOUNTER — Ambulatory Visit (INDEPENDENT_AMBULATORY_CARE_PROVIDER_SITE_OTHER): Payer: Medicare Other | Admitting: Cardiovascular Disease

## 2019-05-10 VITALS — BP 134/75 | HR 71 | Temp 96.9°F | Ht 63.0 in | Wt 166.0 lb

## 2019-05-10 DIAGNOSIS — I739 Peripheral vascular disease, unspecified: Secondary | ICD-10-CM

## 2019-05-10 DIAGNOSIS — Z01812 Encounter for preprocedural laboratory examination: Secondary | ICD-10-CM | POA: Diagnosis not present

## 2019-05-10 DIAGNOSIS — I10 Essential (primary) hypertension: Secondary | ICD-10-CM | POA: Diagnosis not present

## 2019-05-10 DIAGNOSIS — Z720 Tobacco use: Secondary | ICD-10-CM

## 2019-05-10 DIAGNOSIS — E785 Hyperlipidemia, unspecified: Secondary | ICD-10-CM

## 2019-05-10 NOTE — Patient Instructions (Addendum)
    [facility] [place] [address] Dept: [phone] Loc: [phone]  Rose [name]  05/10/2019  You are scheduled for a Peripheral Angiogram on Wednesday, February 17 with Dr. [name].  1. Please arrive at the [facility] (Main Entrance A) at [facility]: [address] [place], [place] [address] at 6:30 am (This time is two hours before your procedure to ensure your preparation). Free valet parking service is available.   Special note: Every effort is made to have your procedure done on time. Please understand that emergencies sometimes delay scheduled procedures.  2. Diet: Do not eat solid foods after midnight.  The patient may have clear liquids until 5am upon the day of the procedure.  3. Labs:  You will need to have lab work at least [place]: COMPLETE BLOOD COUNT  BASIC METABOLIC PANEL LOCATION: [place] Address: [address] [place], [place], [place] [address]    You will need to have a COVID-19 test on Saturday 05/14/2019. Your appointment is at 10:10 am Go to: [facility] Entrance [place], [place] [address] FOR YOUR COVID-19 TEST. YOU MUST HAVE YOUR COVID-19 TEST COMPLETED 4 DAYS PRIOR TO YOUR UPCOMING PROCEDURE/TEST. YOU WILL ALSO NEED TO SELF-ISOLATE AFTER THE COVID-19 TEST UNTIL THE DAY OF YOUR PROCEDURE/TEST. PLEASE BRING YOUR I.D. AND YOUR INSURANCE CARD(S) WITH YOU.   4. Medication instructions in preparation for your procedure:  Stop taking, HTCZ (Hydrochlorothiazide) on the morning of the procedure   Do not take Diabetes Med Glucophage (Metformin) on the day of the procedure and HOLD 48 HOURS AFTER THE PROCEDURE.  On the morning of your procedure, take your Aspirin and any morning medicines NOT listed above.  You may use sips of water.  5.  Plan for one night stay--bring personal belongings. 6. Bring a current list of your medications and current insurance cards. 7. You MUST have a responsible person to drive you home. 8. Someone MUST be with you the first 24 hours after you arrive home or your discharge will be delayed. 9. Please wear clothes that are easy to get on and off and wear slip-on shoes.    Follow-Up: At [facility], you and your health needs are our priority.  As part of our continuing mission to provide you with exceptional heart care, we have created designated Provider Care Teams.  These Care Teams include your primary Cardiologist (physician) and Advanced Practice Providers (APPs -  Physician Assistants and Nurse Practitioners) who all work together to provide you with the care you need, when you need it.  Your next appointment:   1 month(s)  The format for your next appointment:   In Person  Provider:   [name], MD    Thank you for allowing [place] to care for you!   -- [place] [facility]

## 2019-05-10 NOTE — Telephone Encounter (Signed)
Nurse attempted to review 05/10/2019 AVS information with pt during OV and discuss necessary lab work and COVID-19 test, but unable to do so. Pt stated she needed to go.  Updated AVS for 05/10/2019 appointment with Dr. Fletcher Anon along with lab slip mailed to pt on 05/10/2019

## 2019-05-10 NOTE — Progress Notes (Signed)
lk

## 2019-05-10 NOTE — Progress Notes (Signed)
Cardiology Office Note   Date:  05/11/2019   ID:  Sinclair, Compton [birth year], MRN [id number]  PCP:  [name], MD  Cardiologist:   [name], MD   No chief complaint on file.     History of Present Illness: Rose [name] is a 64 y.o. female who is here today for follow-up visit regarding peripheral arterial disease.  The patient has multiple chronic medical conditions that include type 2 diabetes, essential hypertension, hyperlipidemia, remote IV drug use and tobacco use.  She had cardiac work-up in 2018 for exertional dyspnea and atypical chest pain. A pharmacologic nuclear stress test was negative for ischemia.  Echocardiogram was also unremarkable with normal ejection fraction and grade 1 diastolic dysfunction. The patient has known history of nonobstructive peripheral arterial disease.  Noninvasive vascular studies in April 2019 showed normal ABI bilaterally.  Duplex showed mild right SFA disease and moderate left popliteal artery stenosis. During last visit, she complained of increased shortness of breath and leg edema.  An echocardiogram was done in September which showed normal LV systolic function with grade 1 diastolic dysfunction no significant valvular abnormalities.  She developed diabetic foot ulcer on the lateral side of the right foot and she has been managed by Dr. March Rummage.  She underwent repeat arterial Doppler study last month which showed normal ABI bilaterally at 1.11 and TBI of 0.85 on the right and 0.97 with TBI of 0.6 on the left. The right foot ulcer has been present for 1 month and she might require skin graft.  Past Medical History:  Diagnosis Date  . Anxiety   . Arthritis   . Asthma   . Chronic back pain   . Depression   . Diabetes mellitus   . Frequency of urination   . Hep C w/o coma, chronic ([place])   . History of chronic bronchitis   . History of drug abuse ([place]) states quit herion in 2006  . Hypertension   . Methadone maintenance  therapy patient ([place])   . Nocturia   . Tendonitis   . Urge urinary incontinence     Past Surgical History:  Procedure Laterality Date  . APPENDECTOMY  1972  . INTERSTIM IMPLANT PLACEMENT  10/23/2011   Procedure: Barrie Lyme IMPLANT FIRST STAGE;  Surgeon: Reece Packer, MD;  Location: [facility];  Service: Urology;  Laterality: N/A;  . INTERSTIM IMPLANT PLACEMENT  10/23/2011   Procedure: Barrie Lyme IMPLANT SECOND STAGE;  Surgeon: Reece Packer, MD;  Location: [facility];  Service: Urology;  Laterality: N/A;     Current Outpatient Medications  Medication Sig Dispense Refill  . albuterol (PROVENTIL HFA;VENTOLIN HFA) 108 (90 Base) MCG/ACT inhaler Inhale 2 puffs into the lungs every 6 (six) hours as needed. For wheezing 1 Inhaler 2  . aspirin EC 81 MG tablet Take 1 tablet (81 mg total) by mouth daily.    [name] atenolol (TENORMIN) 50 MG tablet Take 0.5 tablets (25 mg total) by mouth daily. TAKE 1/2 TABLET(25 MG) BY MOUTH DAILY 45 tablet 1  . atorvastatin (LIPITOR) 40 MG tablet Take 1 tablet (40 mg total) by mouth daily. 90 tablet 3  . buPROPion (WELLBUTRIN) 75 MG tablet Take 75 mg by mouth daily.    . capsicum oleoresin (TRIXAICIN) 0.025 % cream Apply 1 application topically 3 (three) times daily. (Patient taking differently: Apply 1 application topically as needed (pain). ) 56.6 g 0  . cetirizine (ZYRTEC) 10 MG tablet Take 1 tablet (10 mg total)  by mouth daily. 90 tablet 1  . citalopram (CELEXA) 20 MG tablet Take 20 mg by mouth daily.    . diclofenac sodium (VOLTAREN) 1 % GEL Apply 2 g topically 4 (four) times daily as needed (pain). 150 g 1  . DULoxetine (CYMBALTA) 60 MG capsule Take 1 capsule (60 mg total) by mouth daily. TAKE 1 CAPSULE(60 MG) BY MOUTH DAILY 30 capsule 2  . fluticasone (FLONASE) 50 MCG/ACT nasal spray SHAKE LIQUID AND USE 1 SPRAY IN EACH NOSTRIL DAILY 16 g 5  . hydrochlorothiazide (HYDRODIURIL) 25 MG tablet TAKE 1 TABLET(25 MG) BY MOUTH  DAILY 90 tablet 1  . lidocaine (XYLOCAINE) 5 % ointment Apply 1 application topically as needed. (Patient taking differently: Apply 1 application topically daily as needed for mild pain. ) 50 g 0  . metFORMIN (GLUCOPHAGE) 500 MG tablet Take 1 tablet (500 mg total) by mouth 2 (two) times daily with a meal. 180 tablet 3  . methadone (DOLOPHINE) 10 MG/5ML solution Take 100 mg by mouth daily.    . Multiple Vitamins-Minerals (ONE-A-DAY WOMENS 50+ ADVANTAGE) TABS Take 1 tablet by mouth daily.     . naproxen sodium (ALEVE) 220 MG tablet Take 220 mg by mouth 2 (two) times daily as needed (pain).     . nicotine (NICODERM CQ - DOSED IN MG/24 HOURS) 14 mg/24hr patch Medicaid [id number] L Wks 1-6: 14 mg x 1 patch daily. Wear for 24 hours. If you have sleep disturbances, remove at bedtime. 42 patch 0  . silver sulfADIAZINE (SILVADENE) 1 % cream Apply pea-sized amount to wound daily. 50 g 0  . tiZANidine (ZANAFLEX) 4 MG tablet TAKE 1 TABLET(4 MG) BY MOUTH EVERY 8 HOURS AS NEEDED FOR MUSCLE SPASMS 90 tablet 0  . traZODone (DESYREL) 100 MG tablet Take 100 mg by mouth at bedtime as needed for sleep.   2   No current facility-administered medications for this visit.    Allergies:   Lisinopril, Gabapentin, and Vicodin [hydrocodone-acetaminophen]    Social History:  The patient  reports that she has been smoking cigarettes. She has a 10.00 pack-year smoking history. She has never used smokeless tobacco. She reports that she does not drink alcohol or use drugs.   Family History:  The patient's family history includes CAD in her father; Diabetes type II in her brother, mother, and sister; Heart attack (age of onset: 75) in her brother; Heart attack (age of onset: 60) in her father and mother; Hypertension in her brother and mother.    ROS:  Please see the history of present illness.   Otherwise, review of systems are positive for none.   All other systems are reviewed and negative.    PHYSICAL EXAM: VS:  BP 134/75    Pulse 71   Temp (!) 96.9 F (36.1 C)   Ht 5\' 3"  (1.6 m)   Wt 166 lb (75.3 kg)   LMP 07/31/2011   SpO2 97%   BMI 29.41 kg/m  , BMI Body mass index is 29.41 kg/m. GEN: Well nourished, well developed, in no acute distress  HEENT: normal  Neck: no JVD, carotid bruits, or masses Cardiac: RRR; no murmurs, rubs, or gallops, mild bilateral leg edema Respiratory:  clear to auscultation bilaterally, normal work of breathing GI: soft, nontender, nondistended, + BS MS: no deformity or atrophy  Skin: warm and dry, no rash Neuro:  Strength and sensation are intact Psych: euthymic mood, full affect Vascular: Femoral pulses are normal bilaterally.  Scarring on the right leg  from previous IV drug use.  Distal pulses are not palpable.  There is 1 x 1 cm superficial ulceration on the lateral side of the right foot close to the base of fifth toe.   EKG:  EKG  Is not ordered today.    Recent Labs: 09/08/2018: BUN 13; Creatinine, Ser 1.18; Potassium 4.2; Sodium 142 10/28/2018: B Natriuretic Peptide 119.3 11/30/2018: ALT 14    Lipid Panel    Component Value Date/Time   CHOL 83 (L) 11/30/2018 0912   TRIG 109 11/30/2018 0912   HDL 31 (L) 11/30/2018 0912   CHOLHDL 2.7 11/30/2018 0912   LDLCALC 32 11/30/2018 0912      Wt Readings from Last 3 Encounters:  05/10/19 166 lb (75.3 kg)  03/03/19 171 lb 12.8 oz (77.9 kg)  01/04/19 171 lb (77.6 kg)        PAD Screen 12/19/2016  Previous PAD dx? No  Previous surgical procedure? No  Pain with walking? No  Feet/toe relief with dangling? Yes  Painful, non-healing ulcers? No  Extremities discolored? No      ASSESSMENT AND PLAN:  1.  Peripheral arterial disease nonhealing ulceration on the lateral aspect of the right foot: In spite of normal ABI and toe pressure, the patient has very diminished dorsalis pedis and posterior tibial pulse which might indicate significant disease below the ankle.  Given the very slow healing of the ulceration, I  recommend proceeding with abdominal aortogram with lower extremity runoff and possible endovascular intervention.  I discussed the procedure in detail as well as risks and benefits.  Planned access is via the left common femoral artery.    2.  Tobacco use: She cut down on tobacco use but has not quit completely.  I discussed the importance of complete cessation.  3.  Hyperlipidemia: Continue treatment with atorvastatin with a target LDL of less than 70.  Most recent lipid profile in September of last year showed an LDL of 32.  4.  Essential hypertension: Blood pressure is reasonably controlled on current medications.    Disposition:   FU with me in 1 months  Signed,  [name], MD  05/11/2019 2:30 PM    [place]

## 2019-05-10 NOTE — H&P (View-Only) (Signed)
Cardiology Office Note   Date:  05/11/2019   ID:  Kehly, Bocock [birth year], MRN [id number]  PCP:  [name], MD  Cardiologist:   [name], MD   No chief complaint on file.     History of Present Illness: Rose [name] is a 64 y.o. female who is here today for follow-up visit regarding peripheral arterial disease.  The patient has multiple chronic medical conditions that include type 2 diabetes, essential hypertension, hyperlipidemia, remote IV drug use and tobacco use.  She had cardiac work-up in 2018 for exertional dyspnea and atypical chest pain. A pharmacologic nuclear stress test was negative for ischemia.  Echocardiogram was also unremarkable with normal ejection fraction and grade 1 diastolic dysfunction. The patient has known history of nonobstructive peripheral arterial disease.  Noninvasive vascular studies in April 2019 showed normal ABI bilaterally.  Duplex showed mild right SFA disease and moderate left popliteal artery stenosis. During last visit, she complained of increased shortness of breath and leg edema.  An echocardiogram was done in September which showed normal LV systolic function with grade 1 diastolic dysfunction no significant valvular abnormalities.  She developed diabetic foot ulcer on the lateral side of the right foot and she has been managed by Dr. March Rummage.  She underwent repeat arterial Doppler study last month which showed normal ABI bilaterally at 1.11 and TBI of 0.85 on the right and 0.97 with TBI of 0.6 on the left. The right foot ulcer has been present for 1 month and she might require skin graft.  Past Medical History:  Diagnosis Date  . Anxiety   . Arthritis   . Asthma   . Chronic back pain   . Depression   . Diabetes mellitus   . Frequency of urination   . Hep C w/o coma, chronic ([place])   . History of chronic bronchitis   . History of drug abuse ([place]) states quit herion in 2006  . Hypertension   . Methadone maintenance  therapy patient ([place])   . Nocturia   . Tendonitis   . Urge urinary incontinence     Past Surgical History:  Procedure Laterality Date  . APPENDECTOMY  1972  . INTERSTIM IMPLANT PLACEMENT  10/23/2011   Procedure: Barrie Lyme IMPLANT FIRST STAGE;  Surgeon: Reece Packer, MD;  Location: [facility];  Service: Urology;  Laterality: N/A;  . INTERSTIM IMPLANT PLACEMENT  10/23/2011   Procedure: Barrie Lyme IMPLANT SECOND STAGE;  Surgeon: Reece Packer, MD;  Location: [facility];  Service: Urology;  Laterality: N/A;     Current Outpatient Medications  Medication Sig Dispense Refill  . albuterol (PROVENTIL HFA;VENTOLIN HFA) 108 (90 Base) MCG/ACT inhaler Inhale 2 puffs into the lungs every 6 (six) hours as needed. For wheezing 1 Inhaler 2  . aspirin EC 81 MG tablet Take 1 tablet (81 mg total) by mouth daily.    [name] atenolol (TENORMIN) 50 MG tablet Take 0.5 tablets (25 mg total) by mouth daily. TAKE 1/2 TABLET(25 MG) BY MOUTH DAILY 45 tablet 1  . atorvastatin (LIPITOR) 40 MG tablet Take 1 tablet (40 mg total) by mouth daily. 90 tablet 3  . buPROPion (WELLBUTRIN) 75 MG tablet Take 75 mg by mouth daily.    . capsicum oleoresin (TRIXAICIN) 0.025 % cream Apply 1 application topically 3 (three) times daily. (Patient taking differently: Apply 1 application topically as needed (pain). ) 56.6 g 0  . cetirizine (ZYRTEC) 10 MG tablet Take 1 tablet (10 mg total)  by mouth daily. 90 tablet 1  . citalopram (CELEXA) 20 MG tablet Take 20 mg by mouth daily.    . diclofenac sodium (VOLTAREN) 1 % GEL Apply 2 g topically 4 (four) times daily as needed (pain). 150 g 1  . DULoxetine (CYMBALTA) 60 MG capsule Take 1 capsule (60 mg total) by mouth daily. TAKE 1 CAPSULE(60 MG) BY MOUTH DAILY 30 capsule 2  . fluticasone (FLONASE) 50 MCG/ACT nasal spray SHAKE LIQUID AND USE 1 SPRAY IN EACH NOSTRIL DAILY 16 g 5  . hydrochlorothiazide (HYDRODIURIL) 25 MG tablet TAKE 1 TABLET(25 MG) BY MOUTH  DAILY 90 tablet 1  . lidocaine (XYLOCAINE) 5 % ointment Apply 1 application topically as needed. (Patient taking differently: Apply 1 application topically daily as needed for mild pain. ) 50 g 0  . metFORMIN (GLUCOPHAGE) 500 MG tablet Take 1 tablet (500 mg total) by mouth 2 (two) times daily with a meal. 180 tablet 3  . methadone (DOLOPHINE) 10 MG/5ML solution Take 100 mg by mouth daily.    . Multiple Vitamins-Minerals (ONE-A-DAY WOMENS 50+ ADVANTAGE) TABS Take 1 tablet by mouth daily.     . naproxen sodium (ALEVE) 220 MG tablet Take 220 mg by mouth 2 (two) times daily as needed (pain).     . nicotine (NICODERM CQ - DOSED IN MG/24 HOURS) 14 mg/24hr patch Medicaid [id number] L Wks 1-6: 14 mg x 1 patch daily. Wear for 24 hours. If you have sleep disturbances, remove at bedtime. 42 patch 0  . silver sulfADIAZINE (SILVADENE) 1 % cream Apply pea-sized amount to wound daily. 50 g 0  . tiZANidine (ZANAFLEX) 4 MG tablet TAKE 1 TABLET(4 MG) BY MOUTH EVERY 8 HOURS AS NEEDED FOR MUSCLE SPASMS 90 tablet 0  . traZODone (DESYREL) 100 MG tablet Take 100 mg by mouth at bedtime as needed for sleep.   2   No current facility-administered medications for this visit.    Allergies:   Lisinopril, Gabapentin, and Vicodin [hydrocodone-acetaminophen]    Social History:  The patient  reports that she has been smoking cigarettes. She has a 10.00 pack-year smoking history. She has never used smokeless tobacco. She reports that she does not drink alcohol or use drugs.   Family History:  The patient's family history includes CAD in her father; Diabetes type II in her brother, mother, and sister; Heart attack (age of onset: 67) in her brother; Heart attack (age of onset: 78) in her father and mother; Hypertension in her brother and mother.    ROS:  Please see the history of present illness.   Otherwise, review of systems are positive for none.   All other systems are reviewed and negative.    PHYSICAL EXAM: VS:  BP 134/75    Pulse 71   Temp (!) 96.9 F (36.1 C)   Ht 5\' 3"  (1.6 m)   Wt 166 lb (75.3 kg)   LMP 07/31/2011   SpO2 97%   BMI 29.41 kg/m  , BMI Body mass index is 29.41 kg/m. GEN: Well nourished, well developed, in no acute distress  HEENT: normal  Neck: no JVD, carotid bruits, or masses Cardiac: RRR; no murmurs, rubs, or gallops, mild bilateral leg edema Respiratory:  clear to auscultation bilaterally, normal work of breathing GI: soft, nontender, nondistended, + BS MS: no deformity or atrophy  Skin: warm and dry, no rash Neuro:  Strength and sensation are intact Psych: euthymic mood, full affect Vascular: Femoral pulses are normal bilaterally.  Scarring on the right leg  from previous IV drug use.  Distal pulses are not palpable.  There is 1 x 1 cm superficial ulceration on the lateral side of the right foot close to the base of fifth toe.   EKG:  EKG  Is not ordered today.    Recent Labs: 09/08/2018: BUN 13; Creatinine, Ser 1.18; Potassium 4.2; Sodium 142 10/28/2018: B Natriuretic Peptide 119.3 11/30/2018: ALT 14    Lipid Panel    Component Value Date/Time   CHOL 83 (L) 11/30/2018 0912   TRIG 109 11/30/2018 0912   HDL 31 (L) 11/30/2018 0912   CHOLHDL 2.7 11/30/2018 0912   LDLCALC 32 11/30/2018 0912      Wt Readings from Last 3 Encounters:  05/10/19 166 lb (75.3 kg)  03/03/19 171 lb 12.8 oz (77.9 kg)  01/04/19 171 lb (77.6 kg)        PAD Screen 12/19/2016  Previous PAD dx? No  Previous surgical procedure? No  Pain with walking? No  Feet/toe relief with dangling? Yes  Painful, non-healing ulcers? No  Extremities discolored? No      ASSESSMENT AND PLAN:  1.  Peripheral arterial disease nonhealing ulceration on the lateral aspect of the right foot: In spite of normal ABI and toe pressure, the patient has very diminished dorsalis pedis and posterior tibial pulse which might indicate significant disease below the ankle.  Given the very slow healing of the ulceration, I  recommend proceeding with abdominal aortogram with lower extremity runoff and possible endovascular intervention.  I discussed the procedure in detail as well as risks and benefits.  Planned access is via the left common femoral artery.    2.  Tobacco use: She cut down on tobacco use but has not quit completely.  I discussed the importance of complete cessation.  3.  Hyperlipidemia: Continue treatment with atorvastatin with a target LDL of less than 70.  Most recent lipid profile in September of last year showed an LDL of 32.  4.  Essential hypertension: Blood pressure is reasonably controlled on current medications.    Disposition:   FU with me in 1 months  Signed,  [name], MD  05/11/2019 2:30 PM    [place]

## 2019-05-14 ENCOUNTER — Other Ambulatory Visit (HOSPITAL_COMMUNITY): Payer: Medicare Other

## 2019-05-16 ENCOUNTER — Telehealth: Payer: Self-pay | Admitting: *Deleted

## 2019-05-16 NOTE — Progress Notes (Signed)
Ordered santyl. Rose [name]

## 2019-05-16 NOTE — Telephone Encounter (Signed)
Spoke with the patient. She has requested that the procedure get canceled for now and rescheduled at a later date. A call will be placed to discuss other date options.

## 2019-05-16 NOTE — Telephone Encounter (Signed)
Left a message for the patient to call back. She did not have her labs nor covid test completed for her angiogram on Wednesday. She will need to get these tests completed today in order to proceed for Wednesday.

## 2019-05-18 ENCOUNTER — Encounter: Payer: Medicare Other | Admitting: Internal Medicine

## 2019-05-18 ENCOUNTER — Encounter (HOSPITAL_COMMUNITY): Admission: RE | Payer: Medicare Other | Source: Home / Self Care

## 2019-05-18 ENCOUNTER — Ambulatory Visit (HOSPITAL_COMMUNITY): Admission: RE | Admit: 2019-05-18 | Payer: Medicare Other | Source: Home / Self Care | Admitting: Cardiovascular Disease

## 2019-05-18 ENCOUNTER — Encounter: Payer: Self-pay | Admitting: Internal Medicine

## 2019-05-18 SURGERY — ABDOMINAL AORTOGRAM W/LOWER EXTREMITY
Anesthesia: LOCAL

## 2019-05-19 NOTE — Telephone Encounter (Signed)
Called the patient to reschedule her pv angiogram. She would prefer to wait until 06/01/19. If she had it next week she would need to get labs tomorrow and she does not want to due to the weather.  She has been advised that I will call her back next week to go over all of the instructions.

## 2019-05-20 ENCOUNTER — Ambulatory Visit: Payer: Medicare Other | Admitting: Podiatry

## 2019-05-24 ENCOUNTER — Encounter: Payer: Self-pay | Admitting: *Deleted

## 2019-05-24 NOTE — Telephone Encounter (Signed)
Left a message for the patient to call back to go over instructions for the procedure.  A letter and lab orders has been mailed as well.  You are scheduled for a Peripheral Angiogram on Wednesday, March 3 with Dr. [name].  1. Please arrive at the  [facility]trance [facility] at [facility]: [address] [place], [place] [address] at 6:30 AM (This time is two hours before your procedure to ensure your preparation). Free valet parking service is available.   Special note: Every effort is made to have your procedure done on time. Please understand that emergencies sometimes delay scheduled procedures.  2. Diet: Do not eat solid foods after midnight.  The patient may have clear liquids until 5am upon the day of the procedure.  3. Labs: You will need to have blood drawn by Friday 05/27/19. You do not need an appointment for the lab. Once in our office lobby there is a podium where you can sign in and ring the doorbell to alert [place] that you are here. The lab is open from 8:00 am to 4:30 pm; closed for lunch from 12:45pm-1:45pm.  .You will need to have the coronavirus test completed prior to your procedure. An appointment has been made at 12:10 on 05/28/19. This is a Drive Up Visit at the [facility] [address]. Someone will direct you to the appropriate testing line. Please tell them that you are there for procedure testing. Stay in your car and someone will be with you shortly. Please make sure to have all other labs completed before this test because you will need to stay quarantined until your procedure.  4. Medication instructions in preparation for your procedure: Hold Metformin the morning of the procedure and 48 hours after.  Hold the Hydrochlorothiazide the morning of the procedure.   On the morning of your procedure, take your Aspirin and any morning medicines NOT listed above.  You may use sips of water.  5. Plan for one night stay--bring personal  belongings. 6. Bring a current list of your medications and current insurance cards. 7. You MUST have a responsible person to drive you home. 8. Someone MUST be with you the first 24 hours after you arrive home or your discharge will be delayed. 9. Please wear clothes that are easy to get on and off and wear slip-on shoes.  Thank you for allowing [place] to care for you!   -- [facility]

## 2019-05-25 NOTE — Telephone Encounter (Signed)
Left a message to call back.

## 2019-05-26 ENCOUNTER — Other Ambulatory Visit: Payer: Self-pay

## 2019-05-26 ENCOUNTER — Ambulatory Visit: Payer: Self-pay | Admitting: Podiatry

## 2019-05-26 ENCOUNTER — Ambulatory Visit (INDEPENDENT_AMBULATORY_CARE_PROVIDER_SITE_OTHER): Payer: Medicare Other | Admitting: Podiatry

## 2019-05-26 ENCOUNTER — Telehealth: Payer: Self-pay

## 2019-05-26 VITALS — Temp 97.9°F

## 2019-05-26 DIAGNOSIS — L97512 Non-pressure chronic ulcer of other part of right foot with fat layer exposed: Secondary | ICD-10-CM

## 2019-05-26 DIAGNOSIS — I739 Peripheral vascular disease, unspecified: Secondary | ICD-10-CM | POA: Diagnosis not present

## 2019-05-26 DIAGNOSIS — E0842 Diabetes mellitus due to underlying condition with diabetic polyneuropathy: Secondary | ICD-10-CM

## 2019-05-26 DIAGNOSIS — E1142 Type 2 diabetes mellitus with diabetic polyneuropathy: Secondary | ICD-10-CM | POA: Diagnosis not present

## 2019-05-26 DIAGNOSIS — E08621 Diabetes mellitus due to underlying condition with foot ulcer: Secondary | ICD-10-CM

## 2019-05-26 NOTE — Telephone Encounter (Addendum)
DOS 06/24/19  DEBRIDEMENT WOUND RT - [id number] APPLICATION OF GRAFT - [id number]  [employer] EFFECTIVE DATE - 04/01/19  PLAN DEDUCTIBLE - $0.00  OUT OF POCKET - $7550.00 WITH $7550.00 REMAINING  COPAY $0 / Day OUTPATIENT SURGERY $0 / [place] CO-INSURANCE 20% / Day OUTPATIENT SURGERY 20% / [place]  Notification or Prior Authorization is not required for the requested services  This [employer] Medicare Advantage members plan does not currently require a prior authorization for these services. If you have general questions about the prior authorization requirements, please call us at [phone] or visit [email or web address] > Clinician Resources > Advance and Admission Notification Requirements. The number above acknowledges your notification. Please write this number down for future reference. Notification is not a guarantee of coverage or payment.  Decision ID [id number]

## 2019-05-26 NOTE — Progress Notes (Signed)
  Subjective:  Patient ID: Rose [name], female    DOB: 1955-08-11,  MRN: [id number]  Chief Complaint  Patient presents with  . Wound Check    Pt states "I think it is worse". Denies fever/chills/nausea/vomiting.    64 y.o. female presents for wound care. Hx confirmed with patient.  Objective:  Physical Exam: Wound Location: right 5th MPJ Wound Measurement: 1.5x1.5 Wound Base: Granular/Healthy Peri-wound: Macerated Exudate: Scant/small amount Serosanguinous exudate wound without warmth, erythema, signs of acute infection    Assessment:   1. Diabetic ulcer of other part of right foot associated with diabetes mellitus due to underlying condition, with fat layer exposed ([place])   2. PAD (peripheral artery disease) ([place])   3. DM type 2 with diabetic peripheral neuropathy ([place])   4. Diabetic polyneuropathy associated with diabetes mellitus due to underlying condition [facility])      Plan:  Patient was evaluated and treated and all questions answered.  Ulcer Right foot -Dressing applied consisting of medihoney and mepilex border dressing -Wound cleansed and debrided -Would benefit from operative debridement and skin graft substitute application after restoration of blood flow.  She is pending vascular evaluation on March 3 we will plan for debridement shortly after this.  Consent forms reviewed and drafted by patient all questions answered -Patient has failed all conservative therapy and wishes to proceed with surgical intervention. All risks, benefits, and alternatives discussed with patient. No guarantees given. Consent reviewed and signed by patient. -Planned procedures: Left foot wound debridement and irrigation application skin graft substitute  Procedure: Selective Debridement of Wound Rationale: Removal of devitalized tissue from the wound to promote healing.  Pre-Debridement Wound Measurements: 1.5 cm x 1.5 cm x 0.2 cm  Post-Debridement Wound Measurements: same as  pre-debridement. Type of Debridement: sharp selective Tissue Removed: Devitalized soft-tissue Dressing: Dry, sterile, compression dressing. Disposition: Patient tolerated procedure well. Patient to return in 1 week for follow-up.  No follow-ups on file.

## 2019-05-26 NOTE — Patient Instructions (Signed)
Pre-Operative Instructions  Congratulations, you have decided to take an important step towards improving your quality of life.  You can be assured that the doctors and staff at [facility] will be with you every step of the way.  Here are some important things you should know:  1. Plan to be at the surgery center/hospital at least 1 (one) hour prior to your scheduled time, unless otherwise directed by the surgical center/hospital staff.  You must have a responsible adult accompany you, remain during the surgery and drive you home.  Make sure you have directions to the surgical center/hospital to ensure you arrive on time. 2. If you are having surgery at [facility] or [facility], you will need a copy of your medical history and physical form from your family physician within one month prior to the date of surgery. We will give you a form for your primary physician to complete.  3. We make every effort to accommodate the date you request for surgery.  However, there are times where surgery dates or times have to be moved.  We will contact you as soon as possible if a change in schedule is required.   4. No aspirin/ibuprofen for one week before surgery.  If you are on aspirin, any non-steroidal anti-inflammatory medications (Mobic, Aleve, Ibuprofen) should not be taken seven (7) days prior to your surgery.  You make take Tylenol for pain prior to surgery.  5. Medications - If you are taking daily heart and blood pressure medications, seizure, reflux, allergy, asthma, anxiety, pain or diabetes medications, make sure you notify the surgery center/hospital before the day of surgery so they can tell you which medications you should take or avoid the day of surgery. 6. No food or drink after midnight the night before surgery unless directed otherwise by surgical center/hospital staff. 7. No alcoholic beverages 24-hours prior to surgery.  No smoking 24-hours prior or 24-hours after  surgery. 8. Wear loose pants or shorts. They should be loose enough to fit over bandages, boots, and casts. 9. Don't wear slip-on shoes. Sneakers are preferred. 10. Bring your boot with you to the surgery center/hospital.  Also bring crutches or a walker if your physician has prescribed it for you.  If you do not have this equipment, it will be provided for you after surgery. 11. If you have not been contacted by the surgery center/hospital by the day before your surgery, call to confirm the date and time of your surgery. 12. Leave-time from work may vary depending on the type of surgery you have.  Appropriate arrangements should be made prior to surgery with your employer. 13. Prescriptions will be provided immediately following surgery by your doctor.  Fill these as soon as possible after surgery and take the medication as directed. Pain medications will not be refilled on weekends and must be approved by the doctor. 14. Remove nail polish on the operative foot and avoid getting pedicures prior to surgery. 15. Wash the night before surgery.  The night before surgery wash the foot and leg well with water and the antibacterial soap provided. Be sure to pay special attention to beneath the toenails and in between the toes.  Wash for at least three (3) minutes. Rinse thoroughly with water and dry well with a towel.  Perform this wash unless told not to do so by your physician.  Enclosed: 1 Ice pack (please put in freezer the night before surgery)   1 Hibiclens skin cleaner     Pre-op instructions  If you have any questions regarding the instructions, please do not hesitate to call our office.  [place]: [address], [place], [place] [address] -- [phone]  [place]: [address] [place], [place] [address] -- [phone]  [place]: [address], [place],  [address] -- [phone]   Website: [email or web address] 

## 2019-05-27 NOTE — Telephone Encounter (Signed)
The patient has been called and reminded to get her lab work done and her covid test. She has verbalized her understanding.   Instructions have been gone over with her again and she has verbalized her understanding.

## 2019-05-28 ENCOUNTER — Inpatient Hospital Stay (HOSPITAL_COMMUNITY): Admission: RE | Admit: 2019-05-28 | Payer: Medicare Other | Source: Ambulatory Visit

## 2019-05-28 NOTE — Progress Notes (Signed)
Pt not tested today for COVID for procedure on 3/3. Pt will be tested one time on 3/2 for procedures on 3/3 and 3/5 to prevent having to have covid testing done twice. Pt verbalizes agreement.   Jacqlyn Larsen, RN

## 2019-05-30 ENCOUNTER — Encounter (HOSPITAL_BASED_OUTPATIENT_CLINIC_OR_DEPARTMENT_OTHER): Payer: Self-pay | Admitting: Podiatry

## 2019-05-30 ENCOUNTER — Other Ambulatory Visit: Payer: Self-pay | Admitting: *Deleted

## 2019-05-30 ENCOUNTER — Telehealth: Payer: Self-pay | Admitting: Cardiovascular Disease

## 2019-05-30 DIAGNOSIS — E785 Hyperlipidemia, unspecified: Secondary | ICD-10-CM | POA: Diagnosis not present

## 2019-05-30 DIAGNOSIS — Z01812 Encounter for preprocedural laboratory examination: Secondary | ICD-10-CM | POA: Diagnosis not present

## 2019-05-30 DIAGNOSIS — Z720 Tobacco use: Secondary | ICD-10-CM | POA: Diagnosis not present

## 2019-05-30 DIAGNOSIS — I739 Peripheral vascular disease, unspecified: Secondary | ICD-10-CM

## 2019-05-30 DIAGNOSIS — I10 Essential (primary) hypertension: Secondary | ICD-10-CM | POA: Diagnosis not present

## 2019-05-30 NOTE — Addendum Note (Signed)
Addended by: Ricci Barker on: 05/30/2019 01:31 PM   Modules accepted: Orders

## 2019-05-30 NOTE — Telephone Encounter (Signed)
New Message  Pt called and wanted to talk with a nurse about her covid screening tomorrow. Wants nurse to call her back.

## 2019-05-30 NOTE — Telephone Encounter (Signed)
Duplicate

## 2019-05-30 NOTE — Telephone Encounter (Signed)
Patient calling to speak with Lattie Haw, states she has something else she needs to speak with her about.

## 2019-05-30 NOTE — Telephone Encounter (Signed)
LVM 3/1

## 2019-05-30 NOTE — Telephone Encounter (Signed)
Spoke with the patient. She was calling to inform us that she did get her labs done today and will have her covid test tomorrow for her procedure on 06/01/19. Her covid test was scheduled for 2/27 but had been moved since the patient will be having another procedure on 3/5. See epic for the note.

## 2019-05-31 ENCOUNTER — Telehealth: Payer: Self-pay | Admitting: *Deleted

## 2019-05-31 ENCOUNTER — Other Ambulatory Visit (HOSPITAL_COMMUNITY)
Admission: RE | Admit: 2019-05-31 | Discharge: 2019-05-31 | Disposition: A | Discharge status: Home / Self Care | Payer: Medicare Other | Source: Ambulatory Visit | Attending: Cardiovascular Disease | Admitting: Cardiovascular Disease

## 2019-05-31 ENCOUNTER — Other Ambulatory Visit (HOSPITAL_COMMUNITY): Payer: Medicare Other

## 2019-05-31 DIAGNOSIS — Z01812 Encounter for preprocedural laboratory examination: Secondary | ICD-10-CM | POA: Diagnosis not present

## 2019-05-31 DIAGNOSIS — Z20822 Contact with and (suspected) exposure to covid-19: Secondary | ICD-10-CM | POA: Insufficient documentation

## 2019-05-31 LAB — BASIC METABOLIC PANEL
BUN/Creatinine Ratio: 8 — ABNORMAL LOW (ref 12–28)
BUN: 11 mg/dL (ref 8–27)
CO2: 24 mmol/L (ref 20–29)
Calcium: 9.1 mg/dL (ref 8.7–10.3)
Chloride: 94 mmol/L — ABNORMAL LOW (ref 96–106)
Creatinine, Ser: 1.35 mg/dL — ABNORMAL HIGH (ref 0.57–1.00)
GFR calc Af Amer: 49 mL/min/{1.73_m2} — ABNORMAL LOW (ref >59)
GFR calc non Af Amer: 42 mL/min/{1.73_m2} — ABNORMAL LOW (ref >59)
Glucose: 131 mg/dL — ABNORMAL HIGH (ref 65–99)
Potassium: 4.3 mmol/L (ref 3.5–5.2)
Sodium: 134 mmol/L (ref 134–144)

## 2019-05-31 LAB — CBC
Hematocrit: 36.6 % (ref 34.0–46.6)
Hemoglobin: 12 g/dL (ref 11.1–15.9)
MCH: 27 pg (ref 26.6–33.0)
MCHC: 32.8 g/dL (ref 31.5–35.7)
MCV: 82 fL (ref 79–97)
Platelets: 212 10*3/uL (ref 150–450)
RBC: 4.44 x10E6/uL (ref 3.77–5.28)
RDW: 11.8 % (ref 11.7–15.4)
WBC: 9.4 10*3/uL (ref 3.4–10.8)

## 2019-05-31 LAB — SARS CORONAVIRUS 2 (TAT 6-24 HRS): SARS Coronavirus 2: NEGATIVE

## 2019-05-31 NOTE — Telephone Encounter (Signed)
Pt contacted pre-abdominal aortogram scheduled at [facility] for: Wednesday June 01, 2019 8:30 AM Verified arrival time and place: [facility]) at: 6:30 AM   No solid food after midnight prior to cath, clear liquids until 5 AM day of procedure. Contrast allergy: no  Hold: HCTZ-day before and day of procedure-GFR 49-pt had already taken today Aleve-day before and day of procedure-GFR 49-pt states she has not taken in at least a week Metformin-day of procedure and 48 hours post procedure.  Except hold medications AM meds can be  taken pre-cath with sip of water including: ASA 81 mg   Confirmed patient has responsible adult to drive home post procedure and observe 24 hours after arriving home: yes  Currently, due to Covid-19 pandemic, only one person will be allowed with patient. Must be the same person for patient's entire stay and will be required to wear a mask. They will be asked to wait in the waiting room for the duration of the patient's stay.  Patients are required to wear a mask when they enter the hospital.      COVID-19 Pre-Screening Questions:  . In the past 7 to 10 days have you had a cough,  shortness of breath, headache, congestion, fever (100 or greater) body aches, chills, sore throat, or sudden loss of taste or sense of smell? no . Have you been around anyone with known Covid 19 in the past 7-10 days? no . Have you been around anyone who is awaiting Covid 19 test results in the past 7 to 10 days? no . Have you been around anyone who has been exposed to Covid 19, or has mentioned symptoms of Covid 19 within the past 7 to 10 days? no  I reviewed procedure/mask/visitor instructions, COVID-19 screening questions with patient, she verbalized understanding, thanked me for call.

## 2019-05-31 NOTE — Telephone Encounter (Signed)
No reason to delay for the prescription.  The medication is santyl it's just to help debride the wound

## 2019-05-31 NOTE — Progress Notes (Signed)
Spoke with Janett Billow zanetto pa and covid test 05-31-2019 is ok to use for 06-03-2019 surgery. Spoke with beverly harrelson rn and 05-31-2019 covid test is ok to use for 06-03-2019 surgery.

## 2019-05-31 NOTE — Telephone Encounter (Signed)
The patient has been made aware that both procedures will proceed as planned.

## 2019-05-31 NOTE — Telephone Encounter (Signed)
The patient called in stating that she was unable to get a prescription that Dr. March Rummage has prescribed for her. It will not be ready until Wednesday. She was unsure of the name but stated it was a cream she was supposed to use on her foot.  She wanted to know if her procedure with Dr. Fletcher Anon and Dr. March Rummage should be delayed due to this.  She has been advised that the procedure with Dr. Fletcher Anon will go on as planned as long as she gets the Covid test today and it is negative. Not using the cream will not effect the procedure for Dr. Fletcher Anon.   Message routed to Dr. March Rummage for his input (per the request of the patient).

## 2019-06-01 ENCOUNTER — Other Ambulatory Visit: Payer: Self-pay | Admitting: *Deleted

## 2019-06-01 ENCOUNTER — Ambulatory Visit (HOSPITAL_COMMUNITY)
Admission: RE | Admit: 2019-06-01 | Discharge: 2019-06-01 | Disposition: A | Discharge status: Home / Self Care | Payer: Medicare Other | Attending: Cardiovascular Disease | Admitting: Cardiovascular Disease

## 2019-06-01 ENCOUNTER — Ambulatory Visit (HOSPITAL_COMMUNITY)
Admission: RE | Disposition: A | Discharge status: Home / Self Care | Payer: Self-pay | Source: Home / Self Care | Attending: Cardiovascular Disease

## 2019-06-01 ENCOUNTER — Other Ambulatory Visit: Payer: Self-pay

## 2019-06-01 DIAGNOSIS — Z8249 Family history of ischemic heart disease and other diseases of the circulatory system: Secondary | ICD-10-CM | POA: Diagnosis not present

## 2019-06-01 DIAGNOSIS — F17200 Nicotine dependence, unspecified, uncomplicated: Secondary | ICD-10-CM | POA: Insufficient documentation

## 2019-06-01 DIAGNOSIS — Z7982 Long term (current) use of aspirin: Secondary | ICD-10-CM | POA: Insufficient documentation

## 2019-06-01 DIAGNOSIS — B182 Chronic viral hepatitis C: Secondary | ICD-10-CM | POA: Insufficient documentation

## 2019-06-01 DIAGNOSIS — Z79899 Other long term (current) drug therapy: Secondary | ICD-10-CM | POA: Insufficient documentation

## 2019-06-01 DIAGNOSIS — J45909 Unspecified asthma, uncomplicated: Secondary | ICD-10-CM | POA: Diagnosis not present

## 2019-06-01 DIAGNOSIS — Z7984 Long term (current) use of oral hypoglycemic drugs: Secondary | ICD-10-CM | POA: Diagnosis not present

## 2019-06-01 DIAGNOSIS — F419 Anxiety disorder, unspecified: Secondary | ICD-10-CM | POA: Insufficient documentation

## 2019-06-01 DIAGNOSIS — M1990 Unspecified osteoarthritis, unspecified site: Secondary | ICD-10-CM | POA: Insufficient documentation

## 2019-06-01 DIAGNOSIS — I739 Peripheral vascular disease, unspecified: Secondary | ICD-10-CM

## 2019-06-01 DIAGNOSIS — I10 Essential (primary) hypertension: Secondary | ICD-10-CM | POA: Insufficient documentation

## 2019-06-01 DIAGNOSIS — I70213 Atherosclerosis of native arteries of extremities with intermittent claudication, bilateral legs: Secondary | ICD-10-CM | POA: Diagnosis not present

## 2019-06-01 DIAGNOSIS — E11621 Type 2 diabetes mellitus with foot ulcer: Secondary | ICD-10-CM | POA: Diagnosis not present

## 2019-06-01 DIAGNOSIS — F329 Major depressive disorder, single episode, unspecified: Secondary | ICD-10-CM | POA: Insufficient documentation

## 2019-06-01 DIAGNOSIS — Z888 Allergy status to other drugs, medicaments and biological substances status: Secondary | ICD-10-CM | POA: Diagnosis not present

## 2019-06-01 DIAGNOSIS — Z885 Allergy status to narcotic agent status: Secondary | ICD-10-CM | POA: Diagnosis not present

## 2019-06-01 DIAGNOSIS — L97519 Non-pressure chronic ulcer of other part of right foot with unspecified severity: Secondary | ICD-10-CM | POA: Insufficient documentation

## 2019-06-01 DIAGNOSIS — I70235 Atherosclerosis of native arteries of right leg with ulceration of other part of foot: Secondary | ICD-10-CM | POA: Diagnosis not present

## 2019-06-01 DIAGNOSIS — E1151 Type 2 diabetes mellitus with diabetic peripheral angiopathy without gangrene: Secondary | ICD-10-CM | POA: Insufficient documentation

## 2019-06-01 DIAGNOSIS — E785 Hyperlipidemia, unspecified: Secondary | ICD-10-CM | POA: Diagnosis not present

## 2019-06-01 DIAGNOSIS — Z833 Family history of diabetes mellitus: Secondary | ICD-10-CM | POA: Diagnosis not present

## 2019-06-01 HISTORY — PX: ABDOMINAL AORTOGRAM W/LOWER EXTREMITY: CATH118223

## 2019-06-01 HISTORY — PX: PERIPHERAL VASCULAR INTERVENTION: CATH118257

## 2019-06-01 LAB — POCT ACTIVATED CLOTTING TIME: Activated Clotting Time: 301 seconds

## 2019-06-01 LAB — GLUCOSE, CAPILLARY: Glucose-Capillary: 132 mg/dL — ABNORMAL HIGH (ref 70–99)

## 2019-06-01 SURGERY — ABDOMINAL AORTOGRAM W/LOWER EXTREMITY
Anesthesia: LOCAL | Laterality: Bilateral

## 2019-06-01 MED ORDER — SODIUM CHLORIDE 0.9 % IV SOLN: INTRAVENOUS | Status: DC

## 2019-06-01 MED ORDER — SODIUM CHLORIDE 0.9 % IV SOLN: 250.0000 mL | INTRAVENOUS | Status: DC | PRN

## 2019-06-01 MED ORDER — SODIUM CHLORIDE 0.9 % WEIGHT BASED INFUSION: 1.0000 mL/kg/h | INTRAVENOUS | Status: DC

## 2019-06-01 MED ORDER — SODIUM CHLORIDE 0.9% FLUSH: 3.0000 mL | INTRAVENOUS | Status: DC | PRN

## 2019-06-01 MED ORDER — ACETAMINOPHEN 325 MG PO TABS: 650.0000 mg | ORAL_TABLET | ORAL | Status: DC | PRN

## 2019-06-01 MED ORDER — CLOPIDOGREL BISULFATE 300 MG PO TABS
ORAL_TABLET | ORAL | Status: DC | PRN
  Administered 2019-06-01: 300 mg via ORAL

## 2019-06-01 MED ORDER — LIDOCAINE HCL (PF) 1 % IJ SOLN
INTRAMUSCULAR | Status: AC
  Filled 2019-06-01: qty 30

## 2019-06-01 MED ORDER — ONDANSETRON HCL 4 MG/2ML IJ SOLN: 4.0000 mg | Freq: Four times a day (QID) | INTRAMUSCULAR | Status: DC | PRN

## 2019-06-01 MED ORDER — SODIUM CHLORIDE 0.9% FLUSH: 3.0000 mL | Freq: Two times a day (BID) | INTRAVENOUS | Status: DC

## 2019-06-01 MED ORDER — HEPARIN SODIUM (PORCINE) 1000 UNIT/ML IJ SOLN
INTRAMUSCULAR | Status: AC
  Filled 2019-06-01: qty 1

## 2019-06-01 MED ORDER — HYDRALAZINE HCL 20 MG/ML IJ SOLN: 5.0000 mg | INTRAMUSCULAR | Status: DC | PRN

## 2019-06-01 MED ORDER — LABETALOL HCL 5 MG/ML IV SOLN: 10.0000 mg | INTRAVENOUS | Status: DC | PRN

## 2019-06-01 MED ORDER — MIDAZOLAM HCL 2 MG/2ML IJ SOLN
INTRAMUSCULAR | Status: DC | PRN
  Administered 2019-06-01 (×2): 0.5 mg via INTRAVENOUS

## 2019-06-01 MED ORDER — MIDAZOLAM HCL 2 MG/2ML IJ SOLN
INTRAMUSCULAR | Status: AC
  Filled 2019-06-01: qty 2

## 2019-06-01 MED ORDER — FENTANYL CITRATE (PF) 100 MCG/2ML IJ SOLN
INTRAMUSCULAR | Status: DC | PRN
  Administered 2019-06-01 (×3): 25 ug via INTRAVENOUS

## 2019-06-01 MED ORDER — ASPIRIN 81 MG PO CHEW: 81.0000 mg | CHEWABLE_TABLET | ORAL | Status: DC

## 2019-06-01 MED ORDER — HEPARIN (PORCINE) IN NACL 1000-0.9 UT/500ML-% IV SOLN
INTRAVENOUS | Status: AC
  Filled 2019-06-01: qty 1000

## 2019-06-01 MED ORDER — SODIUM CHLORIDE 0.9 % WEIGHT BASED INFUSION
3.0000 mL/kg/h | INTRAVENOUS | Status: AC
  Administered 2019-06-01: 3 mL/kg/h via INTRAVENOUS

## 2019-06-01 MED ORDER — CLOPIDOGREL BISULFATE 300 MG PO TABS
ORAL_TABLET | ORAL | Status: AC
  Filled 2019-06-01: qty 1

## 2019-06-01 MED ORDER — CLOPIDOGREL BISULFATE 75 MG PO TABS: 75.0000 mg | ORAL_TABLET | Freq: Every day | ORAL | 3 refills | Status: DC

## 2019-06-01 MED ORDER — NITROGLYCERIN 1 MG/10 ML FOR IR/CATH LAB
INTRA_ARTERIAL | Status: DC | PRN
  Administered 2019-06-01: 300 ug via INTRA_ARTERIAL

## 2019-06-01 MED ORDER — NITROGLYCERIN 1 MG/10 ML FOR IR/CATH LAB
INTRA_ARTERIAL | Status: AC
  Filled 2019-06-01: qty 10

## 2019-06-01 MED ORDER — HEPARIN (PORCINE) IN NACL 1000-0.9 UT/500ML-% IV SOLN
INTRAVENOUS | Status: DC | PRN
  Administered 2019-06-01 (×2): 500 mL

## 2019-06-01 MED ORDER — HEPARIN SODIUM (PORCINE) 1000 UNIT/ML IJ SOLN
INTRAMUSCULAR | Status: DC | PRN
  Administered 2019-06-01: 7000 [IU] via INTRAVENOUS

## 2019-06-01 MED ORDER — FENTANYL CITRATE (PF) 100 MCG/2ML IJ SOLN
INTRAMUSCULAR | Status: AC
  Filled 2019-06-01: qty 2

## 2019-06-01 SURGICAL SUPPLY — 20 items
CATH ANGIO 5F PIGTAIL 65CM (CATHETERS) ×1 IMPLANT
CATH CROSS OVER TEMPO 5F (CATHETERS) ×1 IMPLANT
CATH STRAIGHT 5FR 65CM (CATHETERS) ×1 IMPLANT
CLOSURE MYNX CONTROL 6F/7F (Vascular Products) ×2 IMPLANT
KIT ENCORE 26 ADVANTAGE (KITS) ×2 IMPLANT
KIT MICROPUNCTURE NIT STIFF (SHEATH) ×1 IMPLANT
KIT PV (KITS) ×2 IMPLANT
SHEATH BRITE TIP 6FR 35CM (SHEATH) ×2 IMPLANT
SHEATH PINNACLE 5F 10CM (SHEATH) ×1 IMPLANT
SHEATH PINNACLE 6F 10CM (SHEATH) ×2 IMPLANT
SHEATH PROBE COVER 6X72 (BAG) ×1 IMPLANT
STENT EXPRESS LD 8X27X75 (Permanent Stent) ×2 IMPLANT
STOPCOCK MORSE 400PSI 3WAY (MISCELLANEOUS) ×1 IMPLANT
SYR MEDRAD MARK 7 150ML (SYRINGE) ×2 IMPLANT
TAPE VIPERTRACK RADIOPAQ (MISCELLANEOUS) IMPLANT
TAPE VIPERTRACK RADIOPAQUE (MISCELLANEOUS) ×4
TRANSDUCER W/STOPCOCK (MISCELLANEOUS) ×2 IMPLANT
TRAY PV CATH (CUSTOM PROCEDURE TRAY) ×2 IMPLANT
TUBING CIL FLEX 10 FLL-RA (TUBING) ×1 IMPLANT
WIRE HITORQ VERSACORE ST 145CM (WIRE) ×2 IMPLANT

## 2019-06-01 NOTE — Discharge Instructions (Signed)
NO METFORMIN/ GLUCOPHAGE FOR 2 DAYS  Femoral Site Care This sheet gives you information about how to care for yourself after your procedure. Your health care provider may also give you more specific instructions. If you have problems or questions, contact your health care provider. What can I expect after the procedure? After the procedure, it is common to have:  Bruising that usually fades within 1-2 weeks.  Tenderness at the site. Follow these instructions at home: Wound care  Follow instructions from your health care provider about how to take care of your insertion site. Make sure you: ? Wash your hands with soap and water before you change your bandage (dressing). If soap and water are not available, use hand sanitizer. ? Change your dressing as told by your health care provider. ? Leave stitches (sutures), skin glue, or adhesive strips in place. These skin closures may need to stay in place for 2 weeks or longer. If adhesive strip edges start to loosen and curl up, you may trim the loose edges. Do not remove adhesive strips completely unless your health care provider tells you to do that.  Do not take baths, swim, or use a hot tub until your health care provider approves.  You may shower 24-48 hours after the procedure or as told by your health care provider. ? Gently wash the site with plain soap and water. ? Pat the area dry with a clean towel. ? Do not rub the site. This may cause bleeding.  Do not apply powder or lotion to the site. Keep the site clean and dry.  Check your femoral site every day for signs of infection. Check for: ? Redness, swelling, or pain. ? Fluid or blood. ? Warmth. ? Pus or a bad smell. Activity  For the first 2-3 days after your procedure, or as long as directed: ? Avoid climbing stairs as much as possible. ? Do not squat.  Do not lift anything that is heavier than 10 lb (4.5 kg), or the limit that you are told, until your health care provider  says that it is safe.  Rest as directed. ? Avoid sitting for a long time without moving. Get up to take short walks every 1-2 hours.  Do not drive for 24 hours if you were given a medicine to help you relax (sedative). General instructions  Take over-the-counter and prescription medicines only as told by your health care provider.  Keep all follow-up visits as told by your health care provider. This is important. Contact a health care provider if you have:  A fever or chills.  You have redness, swelling, or pain around your insertion site. Get help right away if:  The catheter insertion area swells very fast.  You pass out.  You suddenly start to sweat or your skin gets clammy.  The catheter insertion area is bleeding, and the bleeding does not stop when you hold steady pressure on the area.  The area near or just beyond the catheter insertion site becomes pale, cool, tingly, or numb. These symptoms may represent a serious problem that is an emergency. Do not wait to see if the symptoms will go away. Get medical help right away. Call your local emergency services (911 in the [place] Do not drive yourself to the hospital. Summary  After the procedure, it is common to have bruising that usually fades within 1-2 weeks.  Check your femoral site every day for signs of infection.  Do not lift anything that is heavier   than 10 lb (4.5 kg), or the limit that you are told, until your health care provider says that it is safe. This information is not intended to replace advice given to you by your health care provider. Make sure you discuss any questions you have with your health care provider. Document Revised: 03/30/2017 Document Reviewed: 03/30/2017 Elsevier Patient Education  2020 [employer]vier Inc.  

## 2019-06-01 NOTE — Interval H&P Note (Signed)
History and Physical Interval Note:  06/01/2019 8:38 AM  Rose [name]  has presented today for surgery, with the diagnosis of PAD.  The various methods of treatment have been discussed with the patient and family. After consideration of risks, benefits and other options for treatment, the patient has consented to  Procedure(s): ABDOMINAL AORTOGRAM W/LOWER EXTREMITY (Bilateral) as a surgical intervention.  The patient's history has been reviewed, patient examined, no change in status, stable for surgery.  I have reviewed the patient's chart and labs.  Questions were answered to the patient's satisfaction.     [name]

## 2019-06-01 NOTE — Progress Notes (Signed)
Up and walked and tolerated well; bilat groins stable no bleeding or hematoma 

## 2019-06-02 MED FILL — Lidocaine HCl Local Preservative Free (PF) Inj 1%: INTRAMUSCULAR | Qty: 30 | Status: AC

## 2019-06-03 ENCOUNTER — Telehealth: Payer: Self-pay | Admitting: *Deleted

## 2019-06-03 NOTE — Telephone Encounter (Signed)
Patient called and made aware that she has a post procedure follow up with Dr. Fletcher Anon on 3/23 at 9:20.

## 2019-06-07 ENCOUNTER — Ambulatory Visit: Payer: Medicare Other | Admitting: Cardiovascular Disease

## 2019-06-09 ENCOUNTER — Other Ambulatory Visit: Payer: Self-pay

## 2019-06-09 ENCOUNTER — Ambulatory Visit: Payer: Medicare Other | Admitting: Podiatry

## 2019-06-09 ENCOUNTER — Ambulatory Visit (INDEPENDENT_AMBULATORY_CARE_PROVIDER_SITE_OTHER): Payer: Medicare Other | Admitting: Podiatry

## 2019-06-09 VITALS — Temp 97.1°F

## 2019-06-09 DIAGNOSIS — E08621 Diabetes mellitus due to underlying condition with foot ulcer: Secondary | ICD-10-CM | POA: Diagnosis not present

## 2019-06-09 DIAGNOSIS — L97512 Non-pressure chronic ulcer of other part of right foot with fat layer exposed: Secondary | ICD-10-CM

## 2019-06-09 DIAGNOSIS — I739 Peripheral vascular disease, unspecified: Secondary | ICD-10-CM

## 2019-06-09 DIAGNOSIS — E1142 Type 2 diabetes mellitus with diabetic polyneuropathy: Secondary | ICD-10-CM

## 2019-06-09 NOTE — Progress Notes (Signed)
  Subjective:  Patient ID: Rose [name], female    DOB: 12-May-1955,  MRN: [id number]  Chief Complaint  Patient presents with  . Wound Check    Pt states wound is painful and her santyl medication does not help. Denies fever/chills/nausea/vomiting.   64 y.o. female presents for wound care. Hx confirmed with patient. S/p revascularization Objective:  Physical Exam: Wound Location: right 5th MPJ Wound Measurement: 2x2 Wound Base: Granular/Healthy Peri-wound: Macerated Exudate: Scant/small amount Serosanguinous exudate wound without warmth, erythema, signs of acute infection  Assessment:   1. Diabetic ulcer of other part of right foot associated with diabetes mellitus due to underlying condition, with fat layer exposed ([place])   2. PAD (peripheral artery disease) ([place])   3. DM type 2 with diabetic peripheral neuropathy ([place])      Plan:  Patient was evaluated and treated and all questions answered.  Ulcer Right foot -Wound cleansed -Dressed with silicone Mepilex bandage and silvadene -Continue santyl daily. -F/u in 2 weeks for surgery. Planned procedures: Left foot wound debridement and irrigation application skin graft substitute   No follow-ups on file.

## 2019-06-09 NOTE — H&P (View-Only) (Signed)
  Subjective:  Patient ID: Rose [name], female    DOB: 08-14-1955,  MRN: [id number]  Chief Complaint  Patient presents with  . Wound Check    Pt states wound is painful and her santyl medication does not help. Denies fever/chills/nausea/vomiting.   64 y.o. female presents for wound care. Hx confirmed with patient. S/p revascularization Objective:  Physical Exam: Wound Location: right 5th MPJ Wound Measurement: 2x2 Wound Base: Granular/Healthy Peri-wound: Macerated Exudate: Scant/small amount Serosanguinous exudate wound without warmth, erythema, signs of acute infection  Assessment:   1. Diabetic ulcer of other part of right foot associated with diabetes mellitus due to underlying condition, with fat layer exposed ([place])   2. PAD (peripheral artery disease) ([place])   3. DM type 2 with diabetic peripheral neuropathy (Rose [name])      Plan:  Patient was evaluated and treated and all questions answered.  Ulcer Right foot -Wound cleansed -Dressed with silicone Mepilex bandage and silvadene -Continue santyl daily. -F/u in 2 weeks for surgery. Planned procedures: Left foot wound debridement and irrigation application skin graft substitute   No follow-ups on file.

## 2019-06-16 ENCOUNTER — Encounter (HOSPITAL_BASED_OUTPATIENT_CLINIC_OR_DEPARTMENT_OTHER): Payer: Self-pay | Admitting: Podiatry

## 2019-06-16 ENCOUNTER — Encounter: Payer: Medicare Other | Admitting: Podiatry

## 2019-06-16 ENCOUNTER — Encounter (HOSPITAL_COMMUNITY): Payer: Medicare Other

## 2019-06-16 ENCOUNTER — Other Ambulatory Visit: Payer: Self-pay

## 2019-06-16 NOTE — Progress Notes (Addendum)
Spoke w/ via phone for pre-op interview--- PT Lab needs dos----  Istat 8             Lab results------ current ekg in chart/ epic COVID test ------ 06-18-2019 @ 1230 Arrive at ------- 0800 NPO after ------ [place] Medications to take morning of surgery ----- Methadone, Wellbutrin, Celexa, Cymbalta, Atenolol, Lipitor w/ sips of water. Do not take hctz morning of surgery Diabetic medication ----- do not take metformin morning of surgery Patient Special Instructions ----- asked to bring rescue inhaler Pre-Op special Istructions ----- pt has appointment with her pcp, Dr [name], for H&P form to be filled out for surgery Patient verbalized understanding of instructions that were given at this phone interview. Patient denies shortness of breath, chest pain, fever, cough a this phone interview.   Anesthesia Review: hx HTN, PAD s/p bilateral stenting 06-01-2019, hx heroin abuse, quit 2006, methadone maintenance, hx Hep C, completed treatment 2015, CKD 3, has Interstim implant for urge incontinence.  Pt denies any cardiac s&s.  Chart to be reviewed by anesthesia.  PCP:  Dr B. Coe (per pt office visit 06-20-2019 for H&P) Cardiologist :  Dr Fletcher Anon for PAD (lov 20-11-2019 next appt 06-21-2019) Chest x-ray : 11-06-2011 epic EKG : 11-30-2018 epic Echo : 12-14-2018 epic Stress test:  Nuclear 01-15-2017 epic Cardiac Cath :  no Sleep Study/ CPAP : NO Fasting Blood Sugar :  92-95    / Checks Blood Sugar -- times a day:   Pt stated checks every other day in am Blood Thinner/ Instructions /Last Dose: Plavix ASA / Instructions/ Last Dose : ASA 81mg  Per pt has continued both plavix/ asa because has not been given instructions to stop or not prior to surgery.  Advised pt I will call Dr March Rummage and leave message that she needs instructions and to expect call tomorrow if she does hear from Dr March Rummage office by the afternoon to call her pcp, pt verbalized understanding.

## 2019-06-17 ENCOUNTER — Ambulatory Visit (HOSPITAL_COMMUNITY): Admit: 2019-06-17 | Payer: Medicare Other | Attending: Cardiovascular Disease | Admitting: Cardiovascular Disease

## 2019-06-18 ENCOUNTER — Other Ambulatory Visit (HOSPITAL_COMMUNITY)
Admission: RE | Admit: 2019-06-18 | Discharge: 2019-06-18 | Disposition: A | Discharge status: Home / Self Care | Payer: Medicare Other | Source: Ambulatory Visit | Attending: Podiatry | Admitting: Podiatry

## 2019-06-18 DIAGNOSIS — Z01812 Encounter for preprocedural laboratory examination: Secondary | ICD-10-CM | POA: Diagnosis not present

## 2019-06-18 DIAGNOSIS — Z20822 Contact with and (suspected) exposure to covid-19: Secondary | ICD-10-CM | POA: Diagnosis not present

## 2019-06-18 LAB — SARS CORONAVIRUS 2 (TAT 6-24 HRS): SARS Coronavirus 2: NEGATIVE

## 2019-06-20 ENCOUNTER — Other Ambulatory Visit (HOSPITAL_COMMUNITY): Payer: Medicare Other

## 2019-06-20 NOTE — Progress Notes (Addendum)
Spoke with patient by phone and made aware surgery date changed to 06-24-2019, arrive at 530 am, npo after midnight, follow all other instructions given for surgery, covid test scheduled for 100 pm 06-21-2019.

## 2019-06-21 ENCOUNTER — Ambulatory Visit (INDEPENDENT_AMBULATORY_CARE_PROVIDER_SITE_OTHER): Payer: Medicare Other | Admitting: Internal Medicine

## 2019-06-21 ENCOUNTER — Encounter: Payer: Self-pay | Admitting: Cardiovascular Disease

## 2019-06-21 ENCOUNTER — Other Ambulatory Visit: Payer: Self-pay

## 2019-06-21 ENCOUNTER — Other Ambulatory Visit (HOSPITAL_COMMUNITY)
Admission: RE | Admit: 2019-06-21 | Discharge: 2019-06-21 | Disposition: A | Discharge status: Home / Self Care | Payer: Medicare Other | Source: Ambulatory Visit | Attending: Podiatry | Admitting: Podiatry

## 2019-06-21 ENCOUNTER — Ambulatory Visit (INDEPENDENT_AMBULATORY_CARE_PROVIDER_SITE_OTHER): Payer: Medicare Other | Admitting: Cardiovascular Disease

## 2019-06-21 VITALS — BP 152/78 | HR 71 | Temp 97.1°F | Ht 63.0 in | Wt 168.0 lb

## 2019-06-21 DIAGNOSIS — Z794 Long term (current) use of insulin: Secondary | ICD-10-CM

## 2019-06-21 DIAGNOSIS — Z20822 Contact with and (suspected) exposure to covid-19: Secondary | ICD-10-CM | POA: Diagnosis not present

## 2019-06-21 DIAGNOSIS — Z01818 Encounter for other preprocedural examination: Secondary | ICD-10-CM | POA: Diagnosis not present

## 2019-06-21 DIAGNOSIS — E1151 Type 2 diabetes mellitus with diabetic peripheral angiopathy without gangrene: Secondary | ICD-10-CM

## 2019-06-21 DIAGNOSIS — F17210 Nicotine dependence, cigarettes, uncomplicated: Secondary | ICD-10-CM

## 2019-06-21 DIAGNOSIS — L97519 Non-pressure chronic ulcer of other part of right foot with unspecified severity: Secondary | ICD-10-CM

## 2019-06-21 DIAGNOSIS — F17200 Nicotine dependence, unspecified, uncomplicated: Secondary | ICD-10-CM | POA: Diagnosis not present

## 2019-06-21 DIAGNOSIS — Z720 Tobacco use: Secondary | ICD-10-CM

## 2019-06-21 DIAGNOSIS — E11621 Type 2 diabetes mellitus with foot ulcer: Secondary | ICD-10-CM

## 2019-06-21 DIAGNOSIS — I739 Peripheral vascular disease, unspecified: Secondary | ICD-10-CM

## 2019-06-21 DIAGNOSIS — J45909 Unspecified asthma, uncomplicated: Secondary | ICD-10-CM | POA: Diagnosis not present

## 2019-06-21 DIAGNOSIS — E785 Hyperlipidemia, unspecified: Secondary | ICD-10-CM | POA: Diagnosis not present

## 2019-06-21 DIAGNOSIS — Z885 Allergy status to narcotic agent status: Secondary | ICD-10-CM

## 2019-06-21 DIAGNOSIS — L97509 Non-pressure chronic ulcer of other part of unspecified foot with unspecified severity: Secondary | ICD-10-CM

## 2019-06-21 DIAGNOSIS — Z888 Allergy status to other drugs, medicaments and biological substances status: Secondary | ICD-10-CM

## 2019-06-21 DIAGNOSIS — M797 Fibromyalgia: Secondary | ICD-10-CM | POA: Diagnosis not present

## 2019-06-21 DIAGNOSIS — I10 Essential (primary) hypertension: Secondary | ICD-10-CM | POA: Diagnosis not present

## 2019-06-21 DIAGNOSIS — L97512 Non-pressure chronic ulcer of other part of right foot with fat layer exposed: Secondary | ICD-10-CM | POA: Diagnosis not present

## 2019-06-21 DIAGNOSIS — Z0289 Encounter for other administrative examinations: Secondary | ICD-10-CM

## 2019-06-21 DIAGNOSIS — F1190 Opioid use, unspecified, uncomplicated: Secondary | ICD-10-CM | POA: Diagnosis not present

## 2019-06-21 DIAGNOSIS — Z8619 Personal history of other infectious and parasitic diseases: Secondary | ICD-10-CM | POA: Diagnosis not present

## 2019-06-21 DIAGNOSIS — Z95820 Peripheral vascular angioplasty status with implants and grafts: Secondary | ICD-10-CM

## 2019-06-21 DIAGNOSIS — Z7902 Long term (current) use of antithrombotics/antiplatelets: Secondary | ICD-10-CM

## 2019-06-21 HISTORY — DX: Type 2 diabetes mellitus with foot ulcer: E11.621

## 2019-06-21 HISTORY — DX: Encounter for other administrative examinations: Z02.89

## 2019-06-21 HISTORY — DX: Non-pressure chronic ulcer of other part of unspecified foot with unspecified severity: L97.509

## 2019-06-21 LAB — SARS CORONAVIRUS 2 (TAT 6-24 HRS): SARS Coronavirus 2: NEGATIVE

## 2019-06-21 NOTE — Progress Notes (Signed)
Internal Medicine Clinic Attending  Case discussed with Dr. Harbrecht at the time of the visit.  We reviewed the resident's history and exam and pertinent patient test results.  I agree with the assessment, diagnosis, and plan of care documented in the resident's note.   

## 2019-06-21 NOTE — Addendum Note (Signed)
Addended by: Ricci Barker on: 06/21/2019 10:04 AM   Modules accepted: Orders

## 2019-06-21 NOTE — Patient Instructions (Signed)
FOLLOW-UP INSTRUCTIONS When: As needed What to bring: All of your medications  I have not made any changes to your medications today.   Today you presented for preoperative surgical evaluation.  Per discussion day you are at average risk for minor surgery and are okay to proceed.  Thank you for your visit to the [facility] today. If you have any questions or concerns please call us at [phone].

## 2019-06-21 NOTE — Progress Notes (Signed)
CC: Surgical evaluation for diabetic foot ulcer  HPI:Ms.Noah Isley is a 64 y.o. female who presents for evaluation of surgical evaluation. Please see individual problem based A/P for details.  Diabetic foot wound: Surgical clearance evaluation: Patient is due for surgery for a non-healing diabetic ulcer of the right foot.  She presents today for presurgical evaluation.  She underwent revascularization for significant bilateral common iliac artery disease with iliac artery balloon and stent placement. To remain on plavix until April 5.   Per cardiology's note from 06/21/2019 she is doing well with no claudication, no chest pain and shortness of breath.  Plan: Patient's RCRI evaluation with a score of 1 for diabetes mellitus places her at average risk for minor surgery. She is to remain on plavix for two additional weeks from today per cardiology for her recent stenting for lower extremity for PAD  ?History of ischemic heart disease (RCRI).  No known history coronary artery disease ?History of heart failure (RCRI).  No known history of heart failure ?History of cerebrovascular disease (RCRI).  No known prior CVA ?Insulin dependent diabetes mellitus (RCRI).  She is insulin-dependent ?Preoperative serum creatinine ?2.0 mg/dL (RCRI) or >1.5 mg/dL (ACS-SRC).  Creatinine is below 1.5 ?Increasing age (ACS-SRC).  Age 4 ?American Society of Anesthesiologist class (ACS-SRC). ?Preoperative functional status (ACS-SRC).  Somewhat limited functional status with inability to climb within 2 flights of stairs most notably from increased pain of the right foot.  Past Medical History:  Diagnosis Date  . Allergic rhinitis   . Anxiety   . Arthritis   . Chronic back pain   . CKD (chronic kidney disease), stage III    folllowed by pcp  . Depression   . Diabetic neuropathy ([place])   . Environmental allergies   . Fibromyalgia   . Frequency of urination   . History of chronic bronchitis   . History of  drug abuse ([place]) states quit herion in 2006  . History of hepatitis C last abd ultrasound in epic 11-10-2018;  last hepative panel in epic 11-30-2018 was normal   previously followed by [facility] (notes in care everywhere)-- dx 04/ 2015,  started harvonic treatment 07/ 2015  . Hypertension    followed by pcp  (nuclear stress test 01-15-2017 in epic,  showed low risk normal w/ nuclear ef 55%)  . Methadone maintenance therapy patient ([place])   . Mild asthma    followed by pcp  . Nocturia   . PAD (peripheral artery disease) ([place])    followed by cardiology, dr Fletcher Anon---  06-01-2019  s/p bilateral CIA angioplasty stenting  . Right foot ulcer ([place])   . Tendonitis   . Type 2 diabetes mellitus ([place])    followed by pcp   (06-16-2019  per pt checks blood sugar every other day in am,  fasting sugar 92-95)  . Urge urinary incontinence    Family History  Problem Relation Age of Onset  . Hypertension Mother   . Diabetes type II Mother   . Heart attack Mother 28  . CAD Father   . Heart attack Father 36  . Hypertension Brother   . Diabetes type II Brother   . Heart attack Brother 70  . Diabetes type II Sister    Social History   Tobacco Use  . Smoking status: Current Every Day Smoker    Years: 20.00    Types: Cigarettes  . Smokeless tobacco: Never Used  . Tobacco comment: 06-16-2019  per pt now down  to 5-6 cig. per day  Substance Use Topics  . Alcohol use: No    Alcohol/week: 0.0 standard drinks  . Drug use: Not Currently    Comment: hx heroin use, IV,  last used 2006 (currently on methadone)   Allergies  Allergen Reactions  . Lisinopril Swelling    Angioedema  . Gabapentin Hives and Other (See Comments)    Only when takes over 300 mg dose. "gives me the shakes"  . Vicodin [Hydrocodone-Acetaminophen] Hives, Diarrhea and Other (See Comments)    Stomach  cramps   Review of Systems:  Review of Systems  Constitutional: Negative for chills, diaphoresis and fever.    HENT: Negative for ear pain and sinus pain.   Eyes: Negative for blurred vision, photophobia and redness.  Respiratory: Negative for cough and shortness of breath.   Cardiovascular: Negative for chest pain and leg swelling.  Gastrointestinal: Negative for constipation, diarrhea, nausea and vomiting.  Genitourinary: Negative for flank pain and urgency.  Musculoskeletal: Negative for myalgias.       Right foot pain  Neurological: Negative for dizziness and headaches.  Psychiatric/Behavioral: Negative for depression. The patient is not nervous/anxious.    Physical Exam: Vitals:   06/21/19 1439  BP: (!) 153/72  Pulse: 71  Temp: 98 F (36.7 C)  TempSrc: Oral  SpO2: 100%  Weight: 168 lb 6.4 oz (76.4 kg)  Height: 5\' 3"  (1.6 m)   Filed Weights   06/21/19 1439  Weight: 168 lb 6.4 oz (76.4 kg)   General: A/O x4, in no acute distress, afebrile, nondiaphoretic HEENT: PEERL, EMO intact, no lymphadenopathy Cardio: RRR, no mrg's  Pulmonary: CTA bilaterally, decreased breath sounds but no wheezing or crackles  Abdomen: Bowel sounds normal, soft, nontender  Pelvic: Not indicated GU: Not indicated MSK: BLE nontender, mildly edematous Neuro: Alert, CNII-XII grossly intact, conversational, normal gait Skin: Unremarkable Psych: Appropriate affect, not depressed in appearance, engages well  Chest x-ray not indicated for this average surgical risk patient.  Assessment & Plan:   See Encounters Tab for problem based charting.  Patient discussed with Dr. Lynnae January

## 2019-06-21 NOTE — Progress Notes (Signed)
lov note dr Fletcher Anon cardiology 06-21-2019. Spoke with Janett Billow zanetto pa ok to proceed.

## 2019-06-21 NOTE — Progress Notes (Signed)
Cardiology Office Note   Date:  06/21/2019   ID:  Rose [name], Rose [name] Aug 28, 1955, MRN [id number]  PCP:  [name], MD  Cardiologist:   [name], MD   No chief complaint on file.     History of Present Illness: Rose [name] is a 64 y.o. female who is here today for follow-up visit regarding peripheral arterial disease.  The patient has multiple chronic medical conditions that include type 2 diabetes, essential hypertension, hyperlipidemia, remote IV drug use and tobacco use.  She had cardiac work-up in 2018 for exertional dyspnea and atypical chest pain. A pharmacologic nuclear stress test was negative for ischemia.  Echocardiogram was also unremarkable with normal ejection fraction and grade 1 diastolic dysfunction. The patient has known history of nonobstructive peripheral arterial disease.  Noninvasive vascular studies in April 2019 showed normal ABI bilaterally.  Duplex showed mild right SFA disease and moderate left popliteal artery stenosis. She had an echocardiogram done in September 2020 which showed normal LV systolic function with grade 1 diastolic dysfunction no significant valvular abnormalities.    She developed diabetic foot ulcer on the lateral side of the right foot and she has been managed by Dr. March Rummage.  She underwent repeat arterial Doppler study in January which showed normal ABI bilaterally at 1.11 and TBI of 0.85 on the right and 0.97 with TBI of 0.6 on the left.  I proceeded with an angiogram earlier this month which showed significant bilateral common iliac artery disease with no significant infrainguinal disease.  I performed successful bilateral common iliac artery balloon expandable stent placement. She is scheduled to have debridement and skin grafting later this week with Dr. March Rummage. Otherwise she has been doing reasonably well with no claudication, chest pain or shortness of breath.  Past Medical History:  Diagnosis Date  . Allergic rhinitis     . Anxiety   . Arthritis   . Chronic back pain   . CKD (chronic kidney disease), stage III    folllowed by pcp  . Depression   . Diabetic neuropathy ([place])   . Environmental allergies   . Fibromyalgia   . Frequency of urination   . History of chronic bronchitis   . History of drug abuse ([place]) states quit herion in 2006  . History of hepatitis C last abd ultrasound in epic 11-10-2018;  last hepative panel in epic 11-30-2018 was normal   previously followed by [facility] (notes in care everywhere)-- dx 04/ 2015,  started harvonic treatment 07/ 2015  . Hypertension    followed by pcp  (nuclear stress test 01-15-2017 in epic,  showed low risk normal w/ nuclear ef 55%)  . Methadone maintenance therapy patient ([place])   . Mild asthma    followed by pcp  . Nocturia   . PAD (peripheral artery disease) ([place])    followed by cardiology, dr Fletcher Anon---  06-01-2019  s/p bilateral CIA angioplasty stenting  . Right foot ulcer ([place])   . Tendonitis   . Type 2 diabetes mellitus ([place])    followed by pcp   (06-16-2019  per pt checks blood sugar every other day in am,  fasting sugar 92-95)  . Urge urinary incontinence     Past Surgical History:  Procedure Laterality Date  . ABDOMINAL AORTOGRAM W/LOWER EXTREMITY Bilateral 06/01/2019   Procedure: ABDOMINAL AORTOGRAM W/LOWER EXTREMITY;  Surgeon: Wellington Hampshire, MD;  Location: [facility];  Service: Cardiovascular;  Laterality: Bilateral;  . APPENDECTOMY  1972  .  INTERSTIM IMPLANT PLACEMENT  10/23/2011   Procedure: Barrie Lyme IMPLANT FIRST STAGE;  Surgeon: Reece Packer, MD;  [facility];  Service: Urology;  Laterality: N/A;  . INTERSTIM IMPLANT PLACEMENT  10/23/2011   Procedure: Barrie Lyme IMPLANT SECOND STAGE;  Surgeon: Reece Packer, MD;  Location: [facility];  Service: Urology;  Laterality: N/A;  . PERIPHERAL VASCULAR INTERVENTION Bilateral 06/01/2019   Procedure: PERIPHERAL  VASCULAR INTERVENTION;  Surgeon: Wellington Hampshire, MD;  Location: [facility];  Service: Cardiovascular;  Laterality: Bilateral;     Current Outpatient Medications  Medication Sig Dispense Refill  . albuterol (PROVENTIL HFA;VENTOLIN HFA) 108 (90 Base) MCG/ACT inhaler Inhale 2 puffs into the lungs every 6 (six) hours as needed. For wheezing 1 Inhaler 2  . aspirin EC 81 MG tablet Take 1 tablet (81 mg total) by mouth daily.    [name] atenolol (TENORMIN) 50 MG tablet Take 0.5 tablets (25 mg total) by mouth daily. TAKE 1/2 TABLET(25 MG) BY MOUTH DAILY 45 tablet 1  . atorvastatin (LIPITOR) 40 MG tablet Take 1 tablet (40 mg total) by mouth daily. 90 tablet 3  . buPROPion (WELLBUTRIN) 75 MG tablet Take 75 mg by mouth daily.    . capsicum oleoresin (TRIXAICIN) 0.025 % cream Apply 1 application topically 3 (three) times daily. (Patient taking differently: Apply 1 application topically as needed (pain). ) 56.6 g 0  . cetirizine (ZYRTEC) 10 MG tablet Take 1 tablet (10 mg total) by mouth daily. 90 tablet 1  . citalopram (CELEXA) 20 MG tablet Take 20 mg by mouth daily.    . clopidogrel (PLAVIX) 75 MG tablet Take 1 tablet (75 mg total) by mouth daily. 30 tablet 3  . diclofenac sodium (VOLTAREN) 1 % GEL Apply 2 g topically 4 (four) times daily as needed (pain). 150 g 1  . DULoxetine (CYMBALTA) 60 MG capsule Take 1 capsule (60 mg total) by mouth daily. TAKE 1 CAPSULE(60 MG) BY MOUTH DAILY 30 capsule 2  . fluticasone (FLONASE) 50 MCG/ACT nasal spray SHAKE LIQUID AND USE 1 SPRAY IN EACH NOSTRIL DAILY (Patient taking differently: daily as needed. SHAKE LIQUID AND USE 1 SPRAY IN EACH NOSTRIL DAILY) 16 g 5  . hydrochlorothiazide (HYDRODIURIL) 25 MG tablet TAKE 1 TABLET(25 MG) BY MOUTH DAILY 90 tablet 1  . lidocaine (XYLOCAINE) 5 % ointment Apply 1 application topically as needed. (Patient taking differently: Apply 1 application topically daily as needed for mild pain. ) 50 g 0  . metFORMIN (GLUCOPHAGE) 500 MG  tablet Take 1 tablet (500 mg total) by mouth 2 (two) times daily with a meal. (Patient taking differently: Take 500 mg by mouth 2 (two) times daily with a meal. ) 180 tablet 3  . methadone (DOLOPHINE) 10 MG/5ML solution Take 100 mg by mouth daily.    . Multiple Vitamins-Minerals (ONE-A-DAY WOMENS 50+ ADVANTAGE) TABS Take 1 tablet by mouth daily.     [name] SANTYL ointment Apply 1 application topically daily.    . silver sulfADIAZINE (SILVADENE) 1 % cream Apply pea-sized amount to wound daily. 50 g 0  . tiZANidine (ZANAFLEX) 4 MG tablet TAKE 1 TABLET(4 MG) BY MOUTH EVERY 8 HOURS AS NEEDED FOR MUSCLE SPASMS (Patient taking differently: Take 4 mg by mouth every 8 (eight) hours as needed. ) 90 tablet 0  . naproxen sodium (ALEVE) 220 MG tablet Take 220 mg by mouth 2 (two) times daily as needed (pain).     . nicotine (NICODERM CQ - DOSED IN  MG/24 HOURS) 14 mg/24hr patch Medicaid [id number] L Wks 1-6: 14 mg x 1 patch daily. Wear for 24 hours. If you have sleep disturbances, remove at bedtime. (Patient not taking: Reported on 06/21/2019) 42 patch 0  . traZODone (DESYREL) 100 MG tablet Take 100 mg by mouth at bedtime as needed for sleep.   2   No current facility-administered medications for this visit.    Allergies:   Lisinopril, Gabapentin, and Vicodin [hydrocodone-acetaminophen]    Social History:  The patient  reports that she has been smoking cigarettes. She has smoked for the past 20.00 years. She has never used smokeless tobacco. She reports previous drug use. She reports that she does not drink alcohol.   Family History:  The patient's family history includes CAD in her father; Diabetes type II in her brother, mother, and sister; Heart attack (age of onset: 30) in her brother; Heart attack (age of onset: 88) in her father and mother; Hypertension in her brother and mother.    ROS:  Please see the history of present illness.   Otherwise, review of systems are positive for none.   All other systems are  reviewed and negative.    PHYSICAL EXAM: VS:  BP (!) 152/78   Pulse 71   Temp (!) 97.1 F (36.2 C)   Ht 5\' 3"  (1.6 m)   Wt 168 lb (76.2 kg)   LMP 07/31/2011   SpO2 100%   BMI 29.76 kg/m  , BMI Body mass index is 29.76 kg/m. GEN: Well nourished, well developed, in no acute distress  HEENT: normal  Neck: no JVD, carotid bruits, or masses Cardiac: RRR; no murmurs, rubs, or gallops, mild bilateral leg edema Respiratory:  clear to auscultation bilaterally, normal work of breathing GI: soft, nontender, nondistended, + BS MS: no deformity or atrophy  Skin: warm and dry, no rash Neuro:  Strength and sensation are intact Psych: euthymic mood, full affect Vascular: Femoral pulses are slightly diminished bilaterally.  Scarring on the right leg from previous IV drug use.  No groin hematoma  EKG:  EKG  Is  ordered today. EKG showed normal sinus rhythm with no significant ST or T wave changes.  Recent Labs: 10/28/2018: B Natriuretic Peptide 119.3 11/30/2018: ALT 14 05/30/2019: BUN 11; Creatinine, Ser 1.35; Hemoglobin 12.0; Platelets 212; Potassium 4.3; Sodium 134    Lipid Panel    Component Value Date/Time   CHOL 83 (L) 11/30/2018 0912   TRIG 109 11/30/2018 0912   HDL 31 (L) 11/30/2018 0912   CHOLHDL 2.7 11/30/2018 0912   LDLCALC 32 11/30/2018 0912      Wt Readings from Last 3 Encounters:  06/21/19 168 lb (76.2 kg)  06/01/19 170 lb (77.1 kg)  05/10/19 166 lb (75.3 kg)        PAD Screen 12/19/2016  Previous PAD dx? No  Previous surgical procedure? No  Pain with walking? No  Feet/toe relief with dangling? Yes  Painful, non-healing ulcers? No  Extremities discolored? No      ASSESSMENT AND PLAN:  1.  Peripheral arterial disease nonhealing ulceration on the lateral aspect of the right foot: Status post recent successful stent placement to bilateral common iliac arteries.  No significant infrainguinal disease.  Discontinue Plavix after 2 weeks from now.  Schedule  aortoiliac duplex and ABI.  2.  Tobacco use: She cut down on tobacco use but has not quit completely.  I discussed the importance of complete cessation.  3.  Hyperlipidemia: Continue treatment with atorvastatin with a  target LDL of less than 70.  Most recent lipid profile in September of last year showed an LDL of 32.  4.  Essential hypertension: Blood pressure is mildly elevated today but she seems to be anxious.  Continue same medications for now.    Disposition:   FU with me in 6 months  Signed,  [name], MD  06/21/2019 9:40 AM    [place]

## 2019-06-21 NOTE — Patient Instructions (Signed)
Medication Instructions:  STOP the Plavix in 2 weeks.  *If you need a refill on your cardiac medications before your next appointment, please call your pharmacy*   Lab Work: None ordered If you have labs (blood work) drawn today and your tests are completely normal, you will receive your results only by: [name] MyChart Message (if you have MyChart) OR . A paper copy in the mail If you have any lab test that is abnormal or we need to change your treatment, we will call you to review the results.   Testing/Procedures: Schedule the Aorta/Iliac and ABI   Follow-Up: At [facility], you and your health needs are our priority.  As part of our continuing mission to provide you with exceptional heart care, we have created designated Provider Care Teams.  These Care Teams include your primary Cardiologist (physician) and Advanced Practice Providers (APPs -  Physician Assistants and Nurse Practitioners) who all work together to provide you with the care you need, when you need it.  We recommend signing up for the patient portal called "MyChart".  Sign up information is provided on this After Visit Summary.  MyChart is used to connect with patients for Virtual Visits (Telemedicine).  Patients are able to view lab/test results, encounter notes, upcoming appointments, etc.  Non-urgent messages can be sent to your provider as well.   To learn more about what you can do with MyChart, go to [email or web address].    Your next appointment:   6 month(s)  The format for your next appointment:   In Person  Provider:   [name], MD

## 2019-06-21 NOTE — Assessment & Plan Note (Signed)
Completed preoperative evaluation.

## 2019-06-21 NOTE — Assessment & Plan Note (Signed)
Diabetic foot wound: Surgical clearance evaluation: Patient is due for surgery for a non-healing diabetic ulcer of the right foot.  She presents today for presurgical evaluation.  She underwent revascularization for significant bilateral common iliac artery disease with iliac artery balloon and stent placement. To remain on plavix until April 5.   Per cardiology's note from 06/21/2019 she is doing well with no claudication, no chest pain and shortness of breath.  Plan: Patient's RCRI evaluation with a score of 1 for diabetes mellitus places her at average risk for minor surgery. She is to remain on plavix for two additional weeks from today per cardiology for her recent stenting for lower extremity for PAD  ?History of ischemic heart disease (RCRI).  No known history coronary artery disease ?History of heart failure (RCRI).  No known history of heart failure ?History of cerebrovascular disease (RCRI).  No known prior CVA ?Insulin dependent diabetes mellitus (RCRI).  She is insulin-dependent ?Preoperative serum creatinine ?2.0 mg/dL (RCRI) or >1.5 mg/dL (ACS-SRC).  Creatinine is below 1.5 ?Increasing age (ACS-SRC).  Age 64 ?American Society of Anesthesiologist class (ACS-SRC). ?Preoperative functional status (ACS-SRC).  Somewhat limited functional status with inability to climb within 2 flights of stairs most notably from increased pain of the right foot.

## 2019-06-23 ENCOUNTER — Ambulatory Visit (INDEPENDENT_AMBULATORY_CARE_PROVIDER_SITE_OTHER): Payer: Medicare Other | Admitting: Podiatry

## 2019-06-23 DIAGNOSIS — Z5329 Procedure and treatment not carried out because of patient's decision for other reasons: Secondary | ICD-10-CM

## 2019-06-23 NOTE — Progress Notes (Signed)
No show for appt. 

## 2019-06-23 NOTE — Progress Notes (Signed)
Surgical clearance and h and p note dr Software engineer 06-21-2019 epic.

## 2019-06-24 ENCOUNTER — Other Ambulatory Visit: Payer: Self-pay

## 2019-06-24 ENCOUNTER — Ambulatory Visit (HOSPITAL_BASED_OUTPATIENT_CLINIC_OR_DEPARTMENT_OTHER)
Admission: RE | Admit: 2019-06-24 | Discharge: 2019-06-24 | Disposition: A | Discharge status: Home / Self Care | Payer: Medicare Other | Attending: Podiatry | Admitting: Podiatry

## 2019-06-24 ENCOUNTER — Ambulatory Visit (HOSPITAL_BASED_OUTPATIENT_CLINIC_OR_DEPARTMENT_OTHER): Payer: Medicare Other | Admitting: Physician Assistant

## 2019-06-24 ENCOUNTER — Encounter (HOSPITAL_BASED_OUTPATIENT_CLINIC_OR_DEPARTMENT_OTHER): Payer: Self-pay | Admitting: Podiatry

## 2019-06-24 ENCOUNTER — Encounter (HOSPITAL_BASED_OUTPATIENT_CLINIC_OR_DEPARTMENT_OTHER)
Admission: RE | Disposition: A | Discharge status: Home / Self Care | Payer: Self-pay | Source: Home / Self Care | Attending: Podiatry

## 2019-06-24 DIAGNOSIS — Z20822 Contact with and (suspected) exposure to covid-19: Secondary | ICD-10-CM | POA: Diagnosis not present

## 2019-06-24 DIAGNOSIS — Z8619 Personal history of other infectious and parasitic diseases: Secondary | ICD-10-CM | POA: Diagnosis not present

## 2019-06-24 DIAGNOSIS — J45909 Unspecified asthma, uncomplicated: Secondary | ICD-10-CM | POA: Diagnosis not present

## 2019-06-24 DIAGNOSIS — I10 Essential (primary) hypertension: Secondary | ICD-10-CM | POA: Diagnosis not present

## 2019-06-24 DIAGNOSIS — E1151 Type 2 diabetes mellitus with diabetic peripheral angiopathy without gangrene: Secondary | ICD-10-CM | POA: Insufficient documentation

## 2019-06-24 DIAGNOSIS — Z7984 Long term (current) use of oral hypoglycemic drugs: Secondary | ICD-10-CM | POA: Diagnosis not present

## 2019-06-24 DIAGNOSIS — M797 Fibromyalgia: Secondary | ICD-10-CM | POA: Insufficient documentation

## 2019-06-24 DIAGNOSIS — E11621 Type 2 diabetes mellitus with foot ulcer: Secondary | ICD-10-CM | POA: Insufficient documentation

## 2019-06-24 DIAGNOSIS — F1190 Opioid use, unspecified, uncomplicated: Secondary | ICD-10-CM | POA: Insufficient documentation

## 2019-06-24 DIAGNOSIS — F17200 Nicotine dependence, unspecified, uncomplicated: Secondary | ICD-10-CM | POA: Insufficient documentation

## 2019-06-24 DIAGNOSIS — L97519 Non-pressure chronic ulcer of other part of right foot with unspecified severity: Secondary | ICD-10-CM | POA: Diagnosis not present

## 2019-06-24 DIAGNOSIS — L97512 Non-pressure chronic ulcer of other part of right foot with fat layer exposed: Secondary | ICD-10-CM | POA: Insufficient documentation

## 2019-06-24 HISTORY — DX: Non-pressure chronic ulcer of other part of right foot with unspecified severity: L97.519

## 2019-06-24 HISTORY — DX: Type 2 diabetes mellitus without complications: E11.9

## 2019-06-24 HISTORY — DX: Fibromyalgia: M79.7

## 2019-06-24 HISTORY — DX: Type 2 diabetes mellitus with diabetic neuropathy, unspecified: E11.40

## 2019-06-24 HISTORY — DX: Chronic kidney disease, stage 3 unspecified: N18.30

## 2019-06-24 HISTORY — PX: WOUND DEBRIDEMENT: SHX247

## 2019-06-24 HISTORY — DX: Peripheral vascular disease, unspecified: I73.9

## 2019-06-24 HISTORY — DX: Allergic rhinitis, unspecified: J30.9

## 2019-06-24 HISTORY — DX: Unspecified asthma, uncomplicated: J45.909

## 2019-06-24 HISTORY — PX: PANACOS TISSUE GRAFT: SHX6462

## 2019-06-24 HISTORY — DX: Personal history of other infectious and parasitic diseases: Z86.19

## 2019-06-24 LAB — POCT I-STAT, CHEM 8
BUN: 19 mg/dL (ref 8–23)
Calcium, Ion: 1.15 mmol/L (ref 1.15–1.40)
Chloride: 96 mmol/L — ABNORMAL LOW (ref 98–111)
Creatinine, Ser: 1.2 mg/dL — ABNORMAL HIGH (ref 0.44–1.00)
Glucose, Bld: 95 mg/dL (ref 70–99)
HCT: 38 % (ref 36.0–46.0)
Hemoglobin: 12.9 g/dL (ref 12.0–15.0)
Potassium: 4 mmol/L (ref 3.5–5.1)
Sodium: 136 mmol/L (ref 135–145)
TCO2: 32 mmol/L (ref 22–32)

## 2019-06-24 LAB — GLUCOSE, CAPILLARY: Glucose-Capillary: 107 mg/dL — ABNORMAL HIGH (ref 70–99)

## 2019-06-24 SURGERY — DEBRIDEMENT, WOUND
Anesthesia: Monitor Anesthesia Care | Site: Foot | Laterality: Right

## 2019-06-24 MED ORDER — FENTANYL CITRATE (PF) 100 MCG/2ML IJ SOLN
INTRAMUSCULAR | Status: AC
  Filled 2019-06-24: qty 2

## 2019-06-24 MED ORDER — BUPIVACAINE HCL (PF) 0.5 % IJ SOLN
INTRAMUSCULAR | Status: DC | PRN
  Administered 2019-06-24: 10 mL

## 2019-06-24 MED ORDER — PROPOFOL 500 MG/50ML IV EMUL
INTRAVENOUS | Status: DC | PRN
  Administered 2019-06-24: 100 ug/kg/min via INTRAVENOUS

## 2019-06-24 MED ORDER — LIDOCAINE 2% (20 MG/ML) 5 ML SYRINGE
INTRAMUSCULAR | Status: DC | PRN
  Administered 2019-06-24: 50 mg via INTRAVENOUS

## 2019-06-24 MED ORDER — LIDOCAINE 2% (20 MG/ML) 5 ML SYRINGE
INTRAMUSCULAR | Status: AC
  Filled 2019-06-24: qty 5

## 2019-06-24 MED ORDER — PROPOFOL 10 MG/ML IV BOLUS
INTRAVENOUS | Status: DC | PRN
  Administered 2019-06-24: 40 mg via INTRAVENOUS

## 2019-06-24 MED ORDER — BACITRACIN-NEOMYCIN-POLYMYXIN OINTMENT TUBE
TOPICAL_OINTMENT | CUTANEOUS | Status: DC | PRN
  Administered 2019-06-24: 1 via TOPICAL

## 2019-06-24 MED ORDER — SODIUM CHLORIDE 0.9 % IV SOLN
INTRAVENOUS | Status: DC
  Filled 2019-06-24: qty 1000

## 2019-06-24 MED ORDER — PROPOFOL 10 MG/ML IV BOLUS
INTRAVENOUS | Status: AC
  Filled 2019-06-24: qty 40

## 2019-06-24 MED ORDER — FENTANYL CITRATE (PF) 100 MCG/2ML IJ SOLN
25.0000 ug | INTRAMUSCULAR | Status: DC | PRN
  Filled 2019-06-24: qty 1

## 2019-06-24 MED ORDER — ACETAMINOPHEN 500 MG PO TABS
1000.0000 mg | ORAL_TABLET | Freq: Once | ORAL | Status: AC
  Administered 2019-06-24: 1000 mg via ORAL
  Filled 2019-06-24: qty 2

## 2019-06-24 MED ORDER — MIDAZOLAM HCL 2 MG/2ML IJ SOLN
INTRAMUSCULAR | Status: AC
  Filled 2019-06-24: qty 2

## 2019-06-24 MED ORDER — ACETAMINOPHEN 500 MG PO TABS
ORAL_TABLET | ORAL | Status: AC
  Filled 2019-06-24: qty 2

## 2019-06-24 MED ORDER — CEPHALEXIN 500 MG PO CAPS: 500.0000 mg | ORAL_CAPSULE | Freq: Two times a day (BID) | ORAL | 0 refills | Status: DC

## 2019-06-24 MED ORDER — CEFAZOLIN SODIUM-DEXTROSE 2-4 GM/100ML-% IV SOLN
2.0000 g | INTRAVENOUS | Status: AC
  Administered 2019-06-24: 2 g via INTRAVENOUS
  Filled 2019-06-24: qty 100

## 2019-06-24 MED ORDER — CEFAZOLIN SODIUM-DEXTROSE 2-4 GM/100ML-% IV SOLN
INTRAVENOUS | Status: AC
  Filled 2019-06-24: qty 100

## 2019-06-24 SURGICAL SUPPLY — 62 items
APL PRP STRL LF DISP 70% ISPRP (MISCELLANEOUS)
BLADE SURG 15 STRL LF DISP TIS (BLADE) ×1 IMPLANT
BLADE SURG 15 STRL SS (BLADE) ×3
BNDG CMPR 9X4 STRL LF SNTH (GAUZE/BANDAGES/DRESSINGS)
BNDG ELASTIC 3X5.8 VLCR STR LF (GAUZE/BANDAGES/DRESSINGS) ×3 IMPLANT
BNDG ELASTIC 4X5.8 VLCR STR LF (GAUZE/BANDAGES/DRESSINGS) ×3 IMPLANT
BNDG ESMARK 4X9 LF (GAUZE/BANDAGES/DRESSINGS) IMPLANT
BNDG GAUZE ELAST 4 BULKY (GAUZE/BANDAGES/DRESSINGS) ×3 IMPLANT
CHLORAPREP W/TINT 26 (MISCELLANEOUS) IMPLANT
CNTNR URN SCR LID CUP LEK RST (MISCELLANEOUS) IMPLANT
CONT SPEC 4OZ STRL OR WHT (MISCELLANEOUS)
COVER BACK TABLE 60X90IN (DRAPES) ×3 IMPLANT
COVER WAND RF STERILE (DRAPES) ×3 IMPLANT
CUFF TOURN SGL QUICK 18X4 (TOURNIQUET CUFF) IMPLANT
CUFF TOURN SGL QUICK 24 (TOURNIQUET CUFF)
CUFF TRNQT CYL 24X4X16.5-23 (TOURNIQUET CUFF) IMPLANT
DRAPE 3/4 80X56 (DRAPES) ×3 IMPLANT
DRAPE EXTREMITY T 121X128X90 (DISPOSABLE) ×3 IMPLANT
DRAPE SHEET LG 3/4 BI-LAMINATE (DRAPES) ×3 IMPLANT
DRAPE U-SHAPE 47X51 STRL (DRAPES) ×3 IMPLANT
DRSG TELFA 3X8 NADH (GAUZE/BANDAGES/DRESSINGS) ×3 IMPLANT
ELECT REM PT RETURN 9FT ADLT (ELECTROSURGICAL) ×3
ELECTRODE REM PT RTRN 9FT ADLT (ELECTROSURGICAL) ×1 IMPLANT
GAUZE SPONGE 4X4 12PLY STRL (GAUZE/BANDAGES/DRESSINGS) ×3 IMPLANT
GAUZE SPONGE 4X4 12PLY STRL LF (GAUZE/BANDAGES/DRESSINGS) ×2 IMPLANT
GAUZE XEROFORM 1X8 LF (GAUZE/BANDAGES/DRESSINGS) IMPLANT
GLOVE BIO SURGEON STRL SZ7.5 (GLOVE) ×3 IMPLANT
GLOVE BIOGEL PI IND STRL 8 (GLOVE) ×1 IMPLANT
GLOVE BIOGEL PI INDICATOR 8 (GLOVE) ×2
GOWN STRL REUS W/ TWL XL LVL3 (GOWN DISPOSABLE) ×1 IMPLANT
GOWN STRL REUS W/TWL XL LVL3 (GOWN DISPOSABLE) ×3
MANIFOLD NEPTUNE II (INSTRUMENTS) ×1 IMPLANT
MATRIX WOUND MESHED 2X2 (Tissue) IMPLANT
NDL HYPO 25X1 1.5 SAFETY (NEEDLE) ×1 IMPLANT
NEEDLE HYPO 25X1 1.5 SAFETY (NEEDLE) ×3 IMPLANT
NS IRRIG 1000ML POUR BTL (IV SOLUTION) IMPLANT
PACK BASIN DAY SURGERY FS (CUSTOM PROCEDURE TRAY) ×3 IMPLANT
PAD DRESSING TELFA 3X8 NADH (GAUZE/BANDAGES/DRESSINGS) IMPLANT
PADDING CAST ABS 4INX4YD NS (CAST SUPPLIES)
PADDING CAST ABS COTTON 4X4 ST (CAST SUPPLIES) ×1 IMPLANT
PENCIL SMOKE EVAC W/HOLSTER (ELECTROSURGICAL) ×3 IMPLANT
PROBE DEBRIDE SONICVAC MISONIX (TIP) ×2 IMPLANT
SET IRRIG Y TYPE TUR BLADDER L (SET/KITS/TRAYS/PACK) IMPLANT
SPONGE LAP 4X18 RFD (DISPOSABLE) ×1 IMPLANT
STAPLER VISISTAT 35W (STAPLE) IMPLANT
STOCKINETTE 6  STRL (DRAPES) ×3
STOCKINETTE 6 STRL (DRAPES) ×1 IMPLANT
SUCTION FRAZIER HANDLE 10FR (MISCELLANEOUS)
SUCTION TUBE FRAZIER 10FR DISP (MISCELLANEOUS) IMPLANT
SUT CHROMIC 4 0 SH 27 (SUTURE) ×2 IMPLANT
SUT ETHILON 4 0 PS 2 18 (SUTURE) IMPLANT
SUT MNCRL AB 3-0 PS2 18 (SUTURE) IMPLANT
SUT MNCRL AB 4-0 PS2 18 (SUTURE) IMPLANT
SUT VIC AB 2-0 SH 27 (SUTURE)
SUT VIC AB 2-0 SH 27XBRD (SUTURE) IMPLANT
SYR BULB 3OZ (MISCELLANEOUS) ×3 IMPLANT
SYR CONTROL 10ML LL (SYRINGE) ×3 IMPLANT
TRAY DSU PREP LF (CUSTOM PROCEDURE TRAY) ×3 IMPLANT
TUBE IRRIGATION SET MISONIX (TUBING) ×2 IMPLANT
UNDERPAD 30X36 HEAVY ABSORB (UNDERPADS AND DIAPERS) ×3 IMPLANT
WOUND MATRIX MESHED 2X2 (Tissue) ×2 IMPLANT
YANKAUER SUCT BULB TIP NO VENT (SUCTIONS) ×3 IMPLANT

## 2019-06-24 NOTE — Discharge Instructions (Signed)
  Call your surgeon if you experience:   1.  Fever over 101.0. 2.  Inability to urinate. 3.  Nausea and/or vomiting. 4.  Extreme swelling or bruising at the surgical site. 5.  Continued bleeding from the incision. 6.  Increased pain, redness or drainage from the incision. 7.  Problems related to medication. 8.  Any problems and/or concerns  Post Anesthesia Home Care Instructions  Activity: Get plenty of rest for the remainder of the day. A responsible individual must stay with you for 24 hours following the procedure.  For the next 24 hours, DO NOT: -Drive a car -Paediatric nurse -Drink alcoholic beverages -Take any medication unless instructed by your physician -Make any legal decisions or sign important papers.  Meals: Start with liquid foods such as gelatin or soup. Progress to regular foods as tolerated. Avoid greasy, spicy, heavy foods. If nausea and/or vomiting occur, drink only clear liquids until the nausea and/or vomiting subsides. Call your physician if vomiting continues.  Special Instructions/Symptoms: Your throat may feel dry or sore from the anesthesia or the breathing tube placed in your throat during surgery. If this causes discomfort, gargle with warm salt water. The discomfort should disappear within 24 hours.  If you had a scopolamine patch placed behind your ear for the management of post- operative nausea and/or vomiting:  1. The medication in the patch is effective for 72 hours, after which it should be removed.  Wrap patch in a tissue and discard in the trash. Wash hands thoroughly with soap and water. 2. You may remove the patch earlier than 72 hours if you experience unpleasant side effects which may include dry mouth, dizziness or visual disturbances. 3. Avoid touching the patch. Wash your hands with soap and water after contact with the patch.

## 2019-06-24 NOTE — Anesthesia Preprocedure Evaluation (Addendum)
Anesthesia Evaluation  Patient identified by MRN, date of birth, ID band Patient awake    Reviewed: Allergy & Precautions, NPO status , Patient's Chart, lab work & pertinent test results, reviewed documented beta blocker date and time   Airway Mallampati: III  TM Distance: >3 FB Neck ROM: Full    Dental  (+) Dental Advisory Given, Poor Dentition, Partial Upper   Pulmonary asthma , Current Smoker and Patient abstained from smoking.,    Pulmonary exam normal breath sounds clear to auscultation       Cardiovascular hypertension, Pt. on home beta blockers and Pt. on medications + Peripheral Vascular Disease  Normal cardiovascular exam Rhythm:Regular Rate:Normal  TTE 11/2018 Normal LVEF, G1DD, valves ok  Stress test 2018 normal   Neuro/Psych PSYCHIATRIC DISORDERS Anxiety Depression negative neurological ROS     GI/Hepatic negative GI ROS, (+)     substance abuse  IV drug use, Hepatitis -, C  Endo/Other  negative endocrine ROSdiabetes, Oral Hypoglycemic Agents  Renal/GU Renal InsufficiencyRenal disease  negative genitourinary   Musculoskeletal  (+) Arthritis , Fibromyalgia -  Abdominal   Peds  Hematology negative hematology ROS (+)   Anesthesia Other Findings On methadone  Reproductive/Obstetrics                            Anesthesia Physical Anesthesia Plan  ASA: III  Anesthesia Plan: MAC   Post-op Pain Management:    Induction: Intravenous  PONV Risk Score and Plan: 1 and Propofol infusion, Treatment may vary due to age or medical condition, Midazolam and Ondansetron  Airway Management Planned: Natural Airway  Additional Equipment:   Intra-op Plan:   Post-operative Plan:   Informed Consent: I have reviewed the patients History and Physical, chart, labs and discussed the procedure including the risks, benefits and alternatives for the proposed anesthesia with the patient or  authorized representative who has indicated his/her understanding and acceptance.     Dental advisory given  Plan Discussed with: CRNA  Anesthesia Plan Comments:         Anesthesia Quick Evaluation

## 2019-06-24 NOTE — Anesthesia Procedure Notes (Signed)
Performed by: Coletta Lockner D, CRNA       

## 2019-06-24 NOTE — Interval H&P Note (Signed)
History and Physical Interval Note:  06/24/2019 7:18 AM  Rose [name]  has presented today for surgery, with the diagnosis of Diabetic ulcer.  The various methods of treatment have been discussed with the patient and family. After consideration of risks, benefits and other options for treatment, the patient has consented to  Procedure(s): DEBRIDEMENT WOUND (Right) Application of Skin Graft Substitute (Right) as a surgical intervention.  The patient's history has been reviewed, patient examined, no change in status, stable for surgery.  I have reviewed the patient's chart and labs.  Questions were answered to the patient's satisfaction.     Evelina Bucy

## 2019-06-24 NOTE — Transfer of Care (Signed)
Immediate Anesthesia Transfer of Care Note  Patient: Rose [name]  Procedure(s) Performed: Procedure(s) (LRB): DEBRIDEMENT WOUND (Right) Application of Skin Graft Substitute (Right)  Patient Location: PACU  Anesthesia Type: General  Level of Consciousness: awake, oriented, sedated and patient cooperative  Airway & Oxygen Therapy: Patient Spontanous Breathing and Patient connected to face mask oxygen  Post-op Assessment: Report given to PACU RN and Post -op Vital signs reviewed and stable  Post vital signs: Reviewed and stable  Complications: No apparent anesthesia complications Last Vitals:  Vitals Value Taken Time  BP 131/63 06/24/19 0810  Temp    Pulse 62 06/24/19 0811  Resp 12 06/24/19 0811  SpO2 99 % 06/24/19 0811  Vitals shown include unvalidated device data.  Last Pain:  Vitals:   06/24/19 [id number]  TempSrc: Oral

## 2019-06-24 NOTE — Op Note (Signed)
Patient Name: Rose [name] DOB: December 23, 1955  MRN: [id number]   Date of Service: 06/24/19   Surgeon: Dr. Hardie Pulley, DPM Assistants: None Pre-operative Diagnosis: Right foot ulcer Post-operative Diagnosis: same Procedures:             1) Debridement right foot ulcer  2) Application of skin graft substitute Pathology/Specimens: * No specimens in log * Anesthesia: MAC/local Hemostasis: Anatomic Estimated Blood Loss: 61m Materials: None Medications: none Complications: None  Indications for Procedure:  This is a 64y.o. female with a chronic RLE wound. She has failed local care and would benefit from debridement and skin graft substitute to promote healing.   Procedure in Detail: Patient was identified in pre-operative holding area. Formal consent was signed and the right lower extremity was marked. Patient was brought back to the operating room and placed on the operating room table in the supine position. Anesthesia was induced.   The extremity was prepped and draped in the usual sterile fashion. Timeout was taken to confirm patient name, laterality, and procedure prior to incision. Attention was then directed to the right foot where a wound measuring 2x2 was encountered.  The wound was sharply excisionally debrided with a 15 blade, followed by a misonix ultrasonic debrider. Debridement was performed to bleeding, viable wound base. The wound was debrided to the level of the subutaneous tissue. Following debridement the wound measured 2.5x2.5.  An integra bilayer graft was applied to the wound and adhered with 4-0 chromic suture.  The foot was dressed with neosporin, telfa, kerlix, and ACE. Patient tolerated the procedure well.   Disposition: Following a period of post-operative monitoring, patient will be transferred back home.

## 2019-06-25 ENCOUNTER — Other Ambulatory Visit: Payer: Self-pay | Admitting: Internal Medicine

## 2019-06-25 DIAGNOSIS — I10 Essential (primary) hypertension: Secondary | ICD-10-CM

## 2019-06-25 DIAGNOSIS — M62838 Other muscle spasm: Secondary | ICD-10-CM

## 2019-06-28 NOTE — Anesthesia Postprocedure Evaluation (Signed)
Anesthesia Post Note  Patient: Sports administrator  Procedure(s) Performed: DEBRIDEMENT WOUND (Right Foot) Application of Skin Graft Substitute (Right Foot)     Patient location during evaluation: PACU Anesthesia Type: MAC Level of consciousness: awake and alert Pain management: pain level controlled Vital Signs Assessment: post-procedure vital signs reviewed and stable Respiratory status: spontaneous breathing, nonlabored ventilation, respiratory function stable and patient connected to nasal cannula oxygen Cardiovascular status: stable and blood pressure returned to baseline Postop Assessment: no apparent nausea or vomiting Anesthetic complications: no    Last Vitals:  Vitals:   06/24/19 0930 06/24/19 1021  BP:  119/68  Pulse: 63 68  Resp: 12 13  Temp:  36.6 C  SpO2: 98% 99%    Last Pain:  Vitals:   06/27/19 1017  TempSrc:   PainSc: 8    Pain Goal:                   Macil Crady L Gorden Stthomas

## 2019-06-29 MED ORDER — HYDROCHLOROTHIAZIDE 25 MG PO TABS: ORAL_TABLET | ORAL | 1 refills | Status: DC

## 2019-06-30 ENCOUNTER — Encounter: Payer: Medicare Other | Admitting: Podiatry

## 2019-06-30 ENCOUNTER — Ambulatory Visit (INDEPENDENT_AMBULATORY_CARE_PROVIDER_SITE_OTHER): Payer: Medicare Other | Admitting: Podiatry

## 2019-06-30 ENCOUNTER — Other Ambulatory Visit: Payer: Self-pay

## 2019-06-30 DIAGNOSIS — E08621 Diabetes mellitus due to underlying condition with foot ulcer: Secondary | ICD-10-CM | POA: Diagnosis not present

## 2019-06-30 DIAGNOSIS — L97512 Non-pressure chronic ulcer of other part of right foot with fat layer exposed: Secondary | ICD-10-CM

## 2019-06-30 NOTE — Progress Notes (Signed)
  Subjective:  Patient ID: Rose [name], female    DOB: [birth year],  MRN: [id number]  Chief Complaint  Patient presents with  . Routine Post Op    POV#1 DOS 03.26.2021 RT FOOT WOUND DEBRIDEMENT, APPLICATION OF SKIN GRAFT SUBSTITUTE. Pt states she has less pain and feels okay overall. Denies fever/chills/nausea/vomiting.    64 y.o. female presents for wound care. Hx confirmed with patient.  Objective:  Physical Exam: Wound Location: right foot lateral 5th MPJ Wound Measurement: UTA Wound Base: UTA Peri-wound: Normal Exudate: None: wound tissue dry wound without warmth, erythema, signs of acute infection  Assessment:   1. Diabetic ulcer of other part of right foot associated with diabetes mellitus due to underlying condition, with fat layer exposed ([place])      Plan:  Patient was evaluated and treated and all questions answered.  Ulcer Right Foot -Dressing applied consisting of hydrogel -Offload ulcer with surgical shoe  No follow-ups on file.

## 2019-07-08 ENCOUNTER — Ambulatory Visit (INDEPENDENT_AMBULATORY_CARE_PROVIDER_SITE_OTHER): Payer: Medicare Other | Admitting: Podiatry

## 2019-07-08 ENCOUNTER — Other Ambulatory Visit: Payer: Self-pay

## 2019-07-08 DIAGNOSIS — E08621 Diabetes mellitus due to underlying condition with foot ulcer: Secondary | ICD-10-CM

## 2019-07-08 DIAGNOSIS — L97512 Non-pressure chronic ulcer of other part of right foot with fat layer exposed: Secondary | ICD-10-CM

## 2019-07-08 NOTE — Progress Notes (Signed)
  Subjective:  Patient ID: Rose [name], female    DOB: [birth year],  MRN: [id number]  Chief Complaint  Patient presents with  . Routine Post Op    PO V#2 Pt. states," a week ago I had a ahuge blister and it busted but it's helaing slowly; 7/10 pain." -pt dneies redness/swellign -w/ small amount of drainage tx: sx shoe and santly     64 y.o. female presents for wound care. Hx confirmed with patient.  Objective:  Physical Exam: Wound Location: right foot lateral 5th MPJ Wound Measurement: 1.5x1 Wound Base: UTA Peri-wound: Normal Exudate: None: wound tissue dry wound without warmth, erythema, signs of acute infection  Assessment:   1. Diabetic ulcer of other part of right foot associated with diabetes mellitus due to underlying condition, with fat layer exposed ([place])      Plan:  Patient was evaluated and treated and all questions answered.  Ulcer Right Foot -Graft removed, granulating well under graft -Resume santyl WTD Dressing -Dressed with silvadene and DSD today. -F/u in 2 weeks  No follow-ups on file.

## 2019-07-13 ENCOUNTER — Other Ambulatory Visit (HOSPITAL_COMMUNITY): Payer: Self-pay | Admitting: Cardiovascular Disease

## 2019-07-13 ENCOUNTER — Ambulatory Visit (HOSPITAL_BASED_OUTPATIENT_CLINIC_OR_DEPARTMENT_OTHER)
Admission: RE | Admit: 2019-07-13 | Discharge: 2019-07-13 | Disposition: A | Discharge status: Home / Self Care | Payer: Medicare Other | Source: Ambulatory Visit | Attending: Cardiovascular Disease | Admitting: Cardiovascular Disease

## 2019-07-13 ENCOUNTER — Ambulatory Visit (HOSPITAL_COMMUNITY)
Admission: RE | Admit: 2019-07-13 | Discharge: 2019-07-13 | Disposition: A | Discharge status: Home / Self Care | Payer: Medicare Other | Source: Ambulatory Visit | Attending: Cardiovascular Disease | Admitting: Cardiovascular Disease

## 2019-07-13 ENCOUNTER — Other Ambulatory Visit: Payer: Self-pay

## 2019-07-13 DIAGNOSIS — I739 Peripheral vascular disease, unspecified: Secondary | ICD-10-CM | POA: Insufficient documentation

## 2019-07-13 DIAGNOSIS — Z95828 Presence of other vascular implants and grafts: Secondary | ICD-10-CM

## 2019-07-22 ENCOUNTER — Other Ambulatory Visit: Payer: Self-pay

## 2019-07-22 ENCOUNTER — Ambulatory Visit (INDEPENDENT_AMBULATORY_CARE_PROVIDER_SITE_OTHER): Payer: Medicare Other | Admitting: Podiatry

## 2019-07-22 VITALS — Temp 96.6°F

## 2019-07-22 DIAGNOSIS — L97512 Non-pressure chronic ulcer of other part of right foot with fat layer exposed: Secondary | ICD-10-CM | POA: Diagnosis not present

## 2019-07-22 DIAGNOSIS — E08621 Diabetes mellitus due to underlying condition with foot ulcer: Secondary | ICD-10-CM

## 2019-07-25 ENCOUNTER — Other Ambulatory Visit: Payer: Self-pay | Admitting: Internal Medicine

## 2019-07-25 DIAGNOSIS — G8929 Other chronic pain: Secondary | ICD-10-CM

## 2019-07-25 DIAGNOSIS — M62838 Other muscle spasm: Secondary | ICD-10-CM

## 2019-08-03 ENCOUNTER — Other Ambulatory Visit: Payer: Self-pay | Admitting: Internal Medicine

## 2019-08-03 DIAGNOSIS — Z1231 Encounter for screening mammogram for malignant neoplasm of breast: Secondary | ICD-10-CM

## 2019-08-05 ENCOUNTER — Ambulatory Visit (INDEPENDENT_AMBULATORY_CARE_PROVIDER_SITE_OTHER): Payer: Medicare Other | Admitting: Podiatry

## 2019-08-05 ENCOUNTER — Other Ambulatory Visit: Payer: Self-pay

## 2019-08-05 DIAGNOSIS — E08621 Diabetes mellitus due to underlying condition with foot ulcer: Secondary | ICD-10-CM | POA: Diagnosis not present

## 2019-08-05 DIAGNOSIS — I739 Peripheral vascular disease, unspecified: Secondary | ICD-10-CM | POA: Diagnosis not present

## 2019-08-05 DIAGNOSIS — L97512 Non-pressure chronic ulcer of other part of right foot with fat layer exposed: Secondary | ICD-10-CM

## 2019-08-05 DIAGNOSIS — E0842 Diabetes mellitus due to underlying condition with diabetic polyneuropathy: Secondary | ICD-10-CM | POA: Diagnosis not present

## 2019-08-05 NOTE — Patient Instructions (Signed)
Pre-Operative Instructions  Congratulations, you have decided to take an important step towards improving your quality of life.  You can be assured that the doctors and staff at [facility] will be with you every step of the way.  Here are some important things you should know:  1. Plan to be at the surgery center/hospital at least 1 (one) hour prior to your scheduled time, unless otherwise directed by the surgical center/hospital staff.  You must have a responsible adult accompany you, remain during the surgery and drive you home.  Make sure you have directions to the surgical center/hospital to ensure you arrive on time. 2. If you are having surgery at [facility] or [place] hospitals, you will need a copy of your medical history and physical form from your family physician within one month prior to the date of surgery. We will give you a form for your primary physician to complete.  3. We make every effort to accommodate the date you request for surgery.  However, there are times where surgery dates or times have to be moved.  We will contact you as soon as possible if a change in schedule is required.   4. No aspirin/ibuprofen for one week before surgery.  If you are on aspirin, any non-steroidal anti-inflammatory medications (Mobic, Aleve, Ibuprofen) should not be taken seven (7) days prior to your surgery.  You make take Tylenol for pain prior to surgery.  5. Medications - If you are taking daily heart and blood pressure medications, seizure, reflux, allergy, asthma, anxiety, pain or diabetes medications, make sure you notify the surgery center/hospital before the day of surgery so they can tell you which medications you should take or avoid the day of surgery. 6. No food or drink after midnight the night before surgery unless directed otherwise by surgical center/hospital staff. 7. No alcoholic beverages 24-hours prior to surgery.  No smoking 24-hours prior or 24-hours after  surgery. 8. Wear loose pants or shorts. They should be loose enough to fit over bandages, boots, and casts. 9. Don't wear slip-on shoes. Sneakers are preferred. 10. Bring your boot with you to the surgery center/hospital.  Also bring crutches or a walker if your physician has prescribed it for you.  If you do not have this equipment, it will be provided for you after surgery. 11. If you have not been contacted by the surgery center/hospital by the day before your surgery, call to confirm the date and time of your surgery. 12. Leave-time from work may vary depending on the type of surgery you have.  Appropriate arrangements should be made prior to surgery with your employer. 13. Prescriptions will be provided immediately following surgery by your doctor.  Fill these as soon as possible after surgery and take the medication as directed. Pain medications will not be refilled on weekends and must be approved by the doctor. 14. Remove nail polish on the operative foot and avoid getting pedicures prior to surgery. 15. Wash the night before surgery.  The night before surgery wash the foot and leg well with water and the antibacterial soap provided. Be sure to pay special attention to beneath the toenails and in between the toes.  Wash for at least three (3) minutes. Rinse thoroughly with water and dry well with a towel.  Perform this wash unless told not to do so by your physician.  Enclosed: 1 Ice pack (please put in freezer the night before surgery)   1 Hibiclens skin cleaner     Pre-op instructions  If you have any questions regarding the instructions, please do not hesitate to call our office.  [place]: [address], [place], [place] [address] -- [phone]  [place]: [address] [place], [place] [address] -- [phone]  [place]: [address], [place], [place] [address] -- [phone]   Website: [email or web address] 

## 2019-08-07 IMAGING — MG 2D DIGITAL SCREENING BILATERAL MAMMOGRAM WITH 3D TOMO WITH CAD
8 of 12 series · 8 of 28 positions shown · non-contrast
Comparison: Previous exam(s).

CLINICAL DATA: Screening.

EXAM:
2D DIGITAL SCREENING BILATERAL MAMMOGRAM WITH 3D TOMO WITH CAD

[R MLO synth-2D]
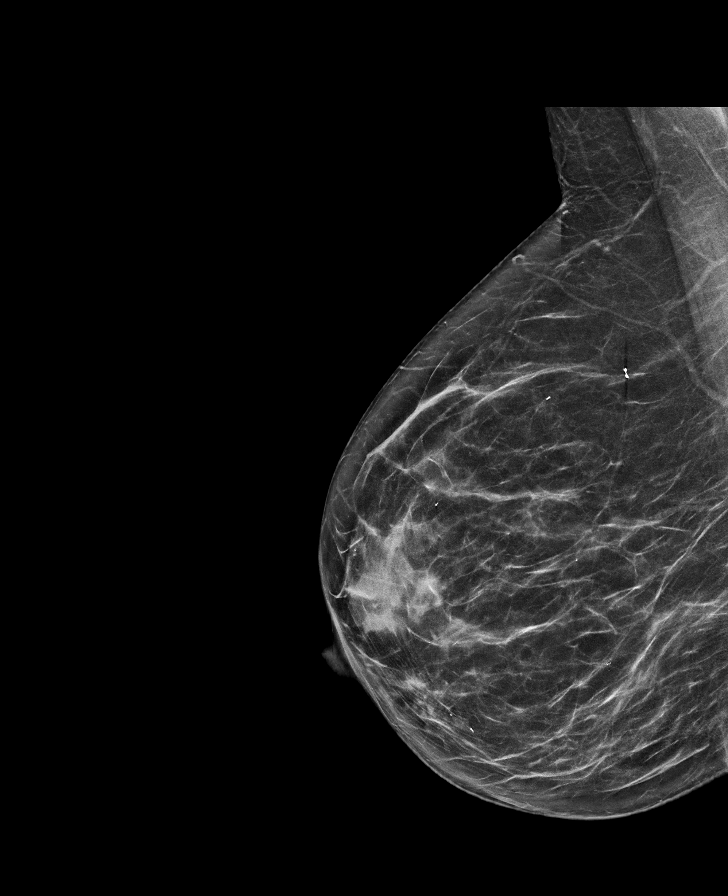

[R CC]
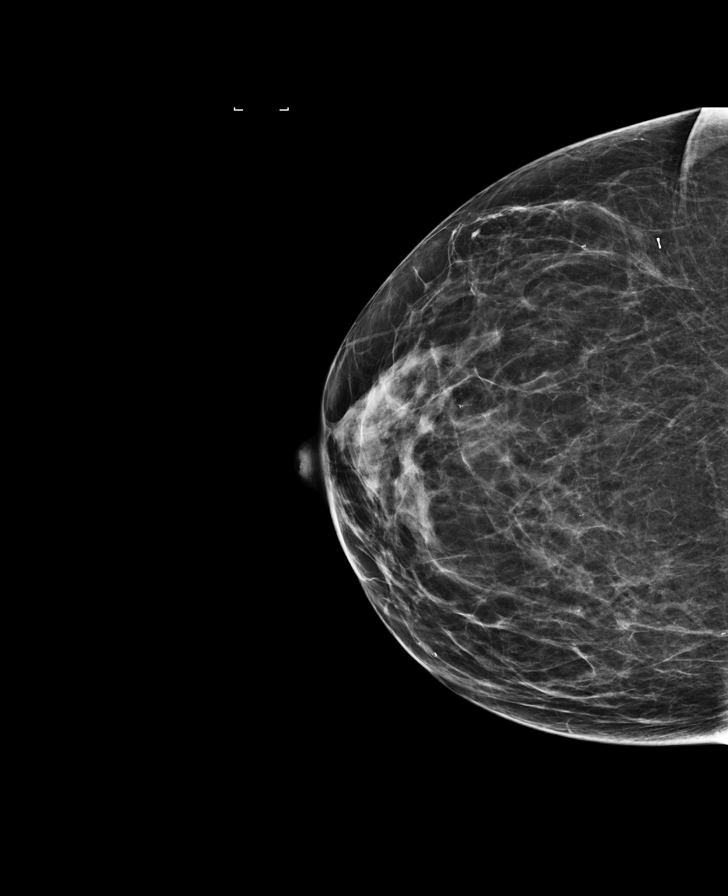

[L MLO synth-2D]
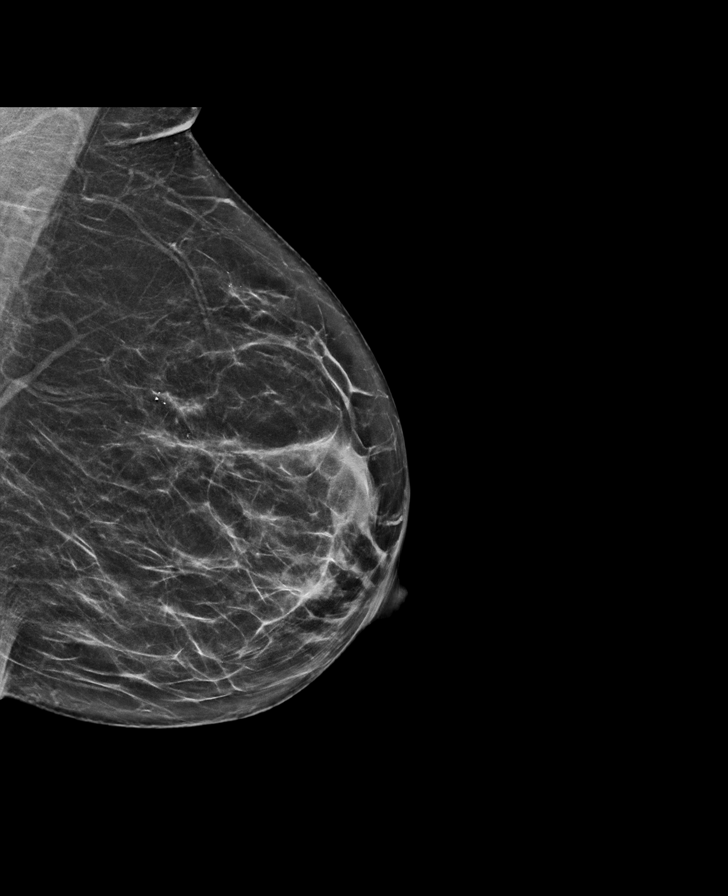

[R MLO]
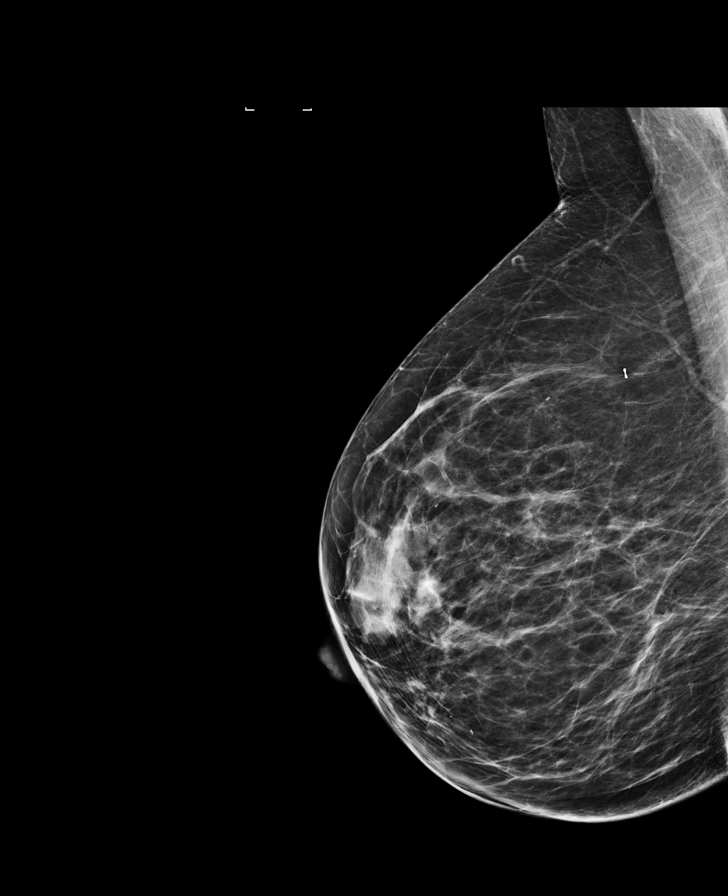

[R CC synth-2D]
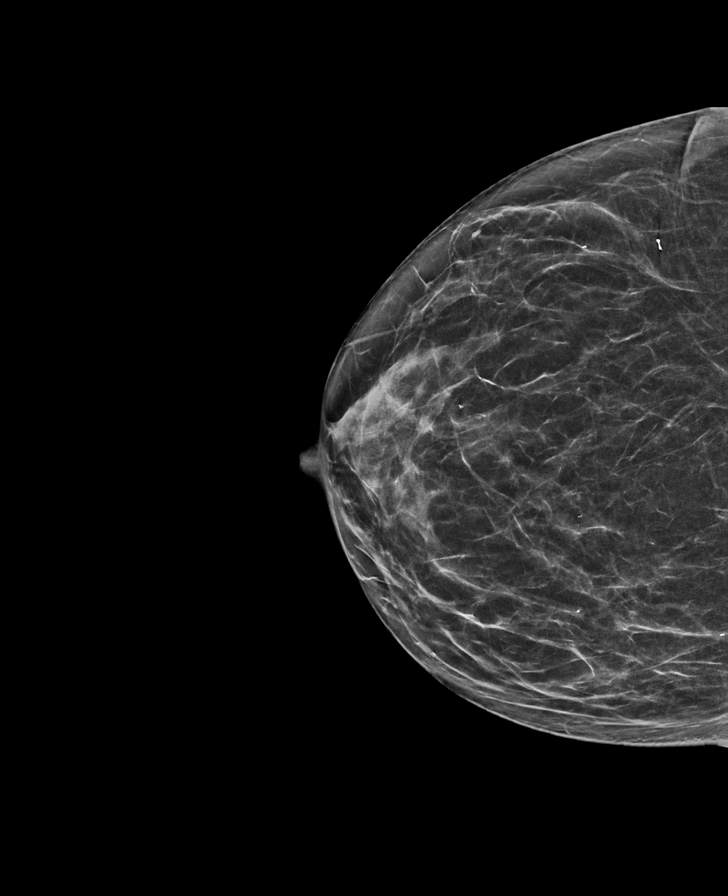

[L CC]
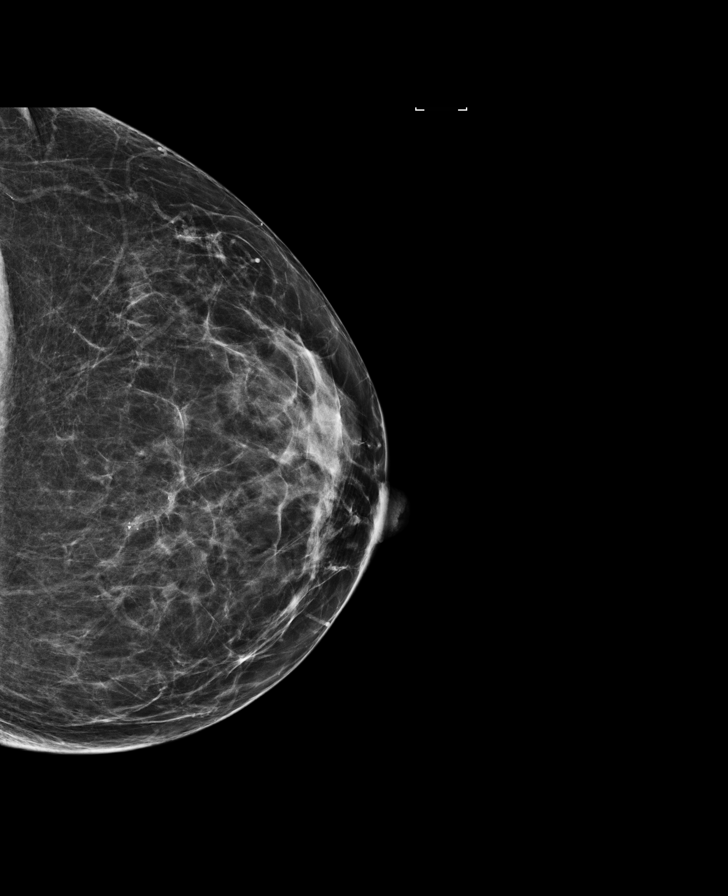

[L MLO]
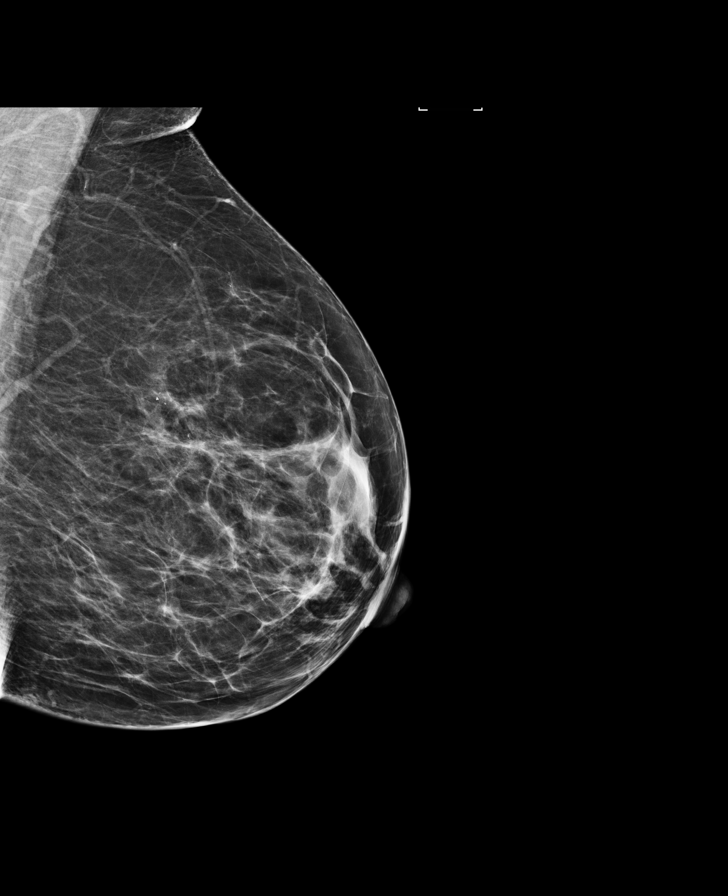

[L CC synth-2D]
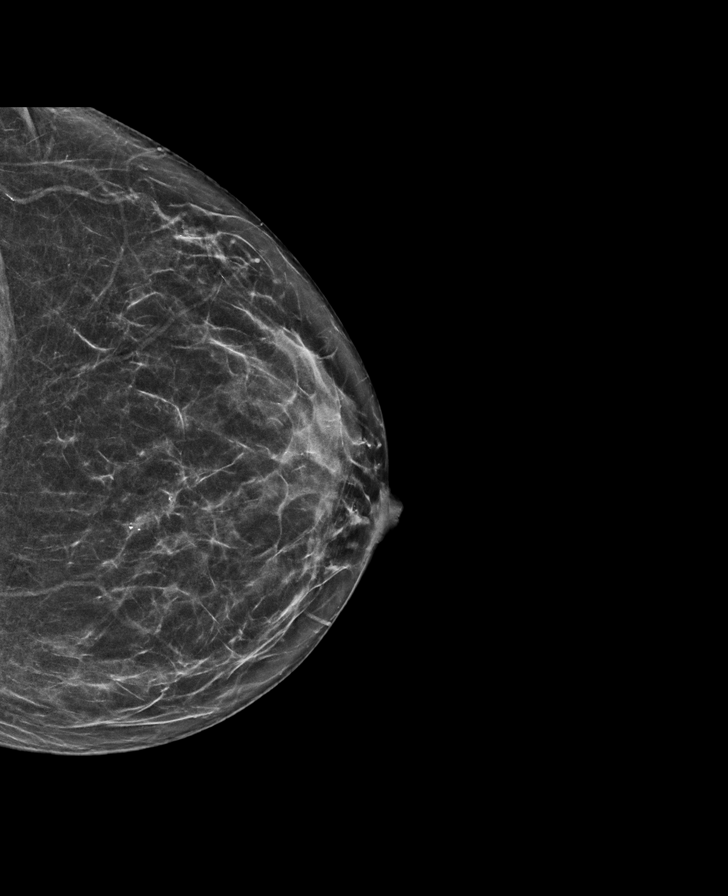

[8 of 28 positions shown; findings below may reference images not displayed]

ACR Breast Density Category c: The breast tissue is heterogeneously
dense, which may obscure small masses.
FINDINGS: There are no findings suspicious for malignancy. Images were
processed with CAD.
IMPRESSION: No mammographic evidence of malignancy. A result letter of this
screening mammogram will be mailed directly to the patient.

RECOMMENDATION:
Screening mammogram in one year. (Code:UA-[id number])

BI-RADS CATEGORY  1: Negative.

## 2019-08-10 ENCOUNTER — Ambulatory Visit: Payer: Medicare Other

## 2019-08-10 ENCOUNTER — Other Ambulatory Visit: Payer: Self-pay

## 2019-08-10 ENCOUNTER — Ambulatory Visit
Admission: RE | Admit: 2019-08-10 | Discharge: 2019-08-10 | Disposition: A | Discharge status: Home / Self Care | Payer: Medicare Other | Source: Ambulatory Visit | Attending: Family Medicine | Admitting: Family Medicine

## 2019-08-10 DIAGNOSIS — Z1231 Encounter for screening mammogram for malignant neoplasm of breast: Secondary | ICD-10-CM

## 2019-08-11 ENCOUNTER — Telehealth: Payer: Self-pay

## 2019-08-11 NOTE — Telephone Encounter (Signed)
DOS 08/24/2019  DEBRIDEMENT WOUND RT - [id number] APPLICATION OF SKIN GRAFT SUB RT - [id number]  [employer]HC MEDICARE EFFECTIVE DATE - 04/01/2019  PLAN DEDUCTIBLE - $0.00 OUT OF POCKET - $7550 W/$7550 REMAINING CO-INSURANCE 20% / Day OUTPATIENT SURGERY 20% / [place] $0 / Day OUTPATIENT SURGERY $0 / [place]  Notification or Prior Authorization is not required for the requested services  This [employer] Medicare Advantage members plan does not currently require a prior authorization for these services. If you have general questions about the prior authorization requirements, please call us at [phone] or visit [email or web address] > Clinician Resources > Advance and Admission Notification Requirements. The number above acknowledges your notification. Please write this number down for future reference. Notification is not a guarantee of coverage or payment.  Decision ID [id number]

## 2019-08-11 NOTE — Progress Notes (Signed)
Patient called.  Patient aware.  

## 2019-08-16 ENCOUNTER — Other Ambulatory Visit: Payer: Self-pay

## 2019-08-16 ENCOUNTER — Ambulatory Visit: Payer: Medicare Other | Admitting: Orthotics

## 2019-08-16 DIAGNOSIS — E08621 Diabetes mellitus due to underlying condition with foot ulcer: Secondary | ICD-10-CM

## 2019-08-16 DIAGNOSIS — E1142 Type 2 diabetes mellitus with diabetic polyneuropathy: Secondary | ICD-10-CM

## 2019-08-16 DIAGNOSIS — L97519 Non-pressure chronic ulcer of other part of right foot with unspecified severity: Secondary | ICD-10-CM

## 2019-08-16 DIAGNOSIS — Q828 Other specified congenital malformations of skin: Secondary | ICD-10-CM

## 2019-08-16 DIAGNOSIS — L97512 Non-pressure chronic ulcer of other part of right foot with fat layer exposed: Secondary | ICD-10-CM

## 2019-08-16 NOTE — Progress Notes (Signed)

## 2019-08-17 ENCOUNTER — Other Ambulatory Visit: Payer: Self-pay

## 2019-08-17 ENCOUNTER — Encounter (HOSPITAL_BASED_OUTPATIENT_CLINIC_OR_DEPARTMENT_OTHER): Payer: Self-pay | Admitting: Podiatry

## 2019-08-17 NOTE — Progress Notes (Addendum)
ADDENDUM:  Chart reviewed by anethesia, Konrad Felix PA,  Ok to proceed meets guidelines.   Spoke w/ via phone for pre-op interview--- PT Lab needs dos----  [place]             Lab results----  Current ekg , dated 06-21-2019, in epic/ chart COVID test ------ 08-20-2019 @ 1155 Arrive at ------- 0930 NPO after ------ [place] Medications to take morning of surgery ----- Wellbutrin, Celexa, Cymbalta, Atenolol, Lipitor w/ sips of water. Diabetic medication ----- do not ttake metformin morning of surgery  Patient Special Instructions ----- asked to bring rescue inhaler Pre-Op special Istructions ----- have not received pt's pcp h&p yet.  Pt stated has given per pcp the form. Patient verbalized understanding of instructions that were given at this phone interview. Patient denies shortness of breath, chest pain, fever, cough a this phone interview.   Anesthesia Review: hx JTN, PAD w/ s/p bilateral stenting 06-01-2019, hx heroin abuse, quit 2006, methadone maintenance, hx Hep C, treated 2015. CKD3, pt denies any cardiac s&s.  H&P note received yet. Chart to be reviewed by Konrad Felix PA  PCP:  Dr B. Erline Levine 06-20-2019 epic) Cardiologist :  Dr Fletcher Anon ([place] 06-21-2019 epic) Chest x-ray : 11-06-2011 epic EKG : 06-21-2019 epic Echo : 12-14-2018 epic Stress test:  Nuclear 01-15-2017 epic Cardiac Cath :  no Sleep Study/ CPAP : no Fasting Blood Sugar :  92 -- 95    / Checks Blood Sugar -- times a day:  Checks daily in AM Blood Thinner/ Instructions /Last Dose:  No; (pt was on Plavix, pt stated Dr Fletcher Anon stopped it approx. 04/ 2021) ASA / Instructions/ Last Dose :  ASA 81mg /  Per pt was given instructions from Dr Fletcher Anon to continue ASA and not stop for surgery

## 2019-08-18 ENCOUNTER — Other Ambulatory Visit: Payer: Self-pay

## 2019-08-18 ENCOUNTER — Ambulatory Visit (INDEPENDENT_AMBULATORY_CARE_PROVIDER_SITE_OTHER): Payer: Medicare Other | Admitting: Internal Medicine

## 2019-08-18 ENCOUNTER — Encounter: Payer: Self-pay | Admitting: Internal Medicine

## 2019-08-18 VITALS — BP 143/58 | HR 68 | Temp 99.3°F | Ht 63.0 in | Wt 165.5 lb

## 2019-08-18 DIAGNOSIS — F17200 Nicotine dependence, unspecified, uncomplicated: Secondary | ICD-10-CM | POA: Diagnosis not present

## 2019-08-18 DIAGNOSIS — E1169 Type 2 diabetes mellitus with other specified complication: Secondary | ICD-10-CM | POA: Diagnosis not present

## 2019-08-18 DIAGNOSIS — I10 Essential (primary) hypertension: Secondary | ICD-10-CM

## 2019-08-18 DIAGNOSIS — F1120 Opioid dependence, uncomplicated: Secondary | ICD-10-CM

## 2019-08-18 DIAGNOSIS — F329 Major depressive disorder, single episode, unspecified: Secondary | ICD-10-CM

## 2019-08-18 DIAGNOSIS — I739 Peripheral vascular disease, unspecified: Secondary | ICD-10-CM

## 2019-08-18 DIAGNOSIS — F32A Depression, unspecified: Secondary | ICD-10-CM

## 2019-08-18 LAB — POCT GLYCOSYLATED HEMOGLOBIN (HGB A1C): Hemoglobin A1C: 6.4 % — AB (ref 4.0–5.6)

## 2019-08-18 LAB — GLUCOSE, CAPILLARY: Glucose-Capillary: 126 mg/dL — ABNORMAL HIGH (ref 70–99)

## 2019-08-18 NOTE — Assessment & Plan Note (Signed)
Patient admits to a significant history of peripheral artery disease including a recent vascular surgery requiring stenting of her common femoral arteries in 05/2019.  Counseled on importance of limiting her cigarette smoke as it will invariably contribute to worsening peripheral artery disease.  She denies any obvious signs or symptoms of peripheral artery disease.

## 2019-08-18 NOTE — Assessment & Plan Note (Signed)
Patient is currently taking hydrochlorothiazide 20 mg daily atenolol 50 mg daily for blood pressure management.  Patient's blood pressure today was 143/58.  Repeat blood pressure was not able to be obtained.  Patient has a history of having elevated blood pressure when initially presenting to the office and was subsequent reading her pressure decreases.  Patient likely normotensive but will need follow-up on blood pressure management in the near future.  -Continue current blood pressure management -Recheck blood pressure in 1 month and adjust medications accordingly

## 2019-08-18 NOTE — Assessment & Plan Note (Signed)
Patient's last hemoglobin A1c was 6.4 in 08/2018.  Microalbumin was performed at this time as well.  She is currently taking Metformin 500 mg twice daily and denies any adverse reactions associated with this medication.  Also the patient importance of periodically checking her feet for sores and annual eye exams.  Deferred foot exam today as she currently has a right lower extremity wound.  Her last eye exam was on 05/2018 by Dr. Kathlen Mody.  Plan:  -Metformin 500 mg twice daily -She has a follow-up appointment in 1 month we will repeat diabetes labs at that time.

## 2019-08-18 NOTE — Assessment & Plan Note (Signed)
Patient has a subjective history of methadone use.  I do not have any records of his prescribing this medication for her knee is not listed on her PDMP review.  I will need to follow-up on this in 1 month letter next appointment.

## 2019-08-18 NOTE — Patient Instructions (Addendum)
Thank you, [name] Rose [name] for allowing [place] to provide your care today. Today we discussed up coming surgery.    I have ordered no labs for you. I will call if any are abnormal.    I will fax the necessary paperwork to your surgeon.   I have ordered the following medication/changed the following medications:  1. Discontinue Cymbalta   Please follow-up in 1 month.    Should you have any questions or concerns please call the internal medicine clinic at [phone].    [name], D.O. [facility]

## 2019-08-18 NOTE — Progress Notes (Signed)
CC: Foot injury   HPI:  [name] is a 64 y.o. female with a past medical history stated below and presents today for foot injury. Please see problem based assessment and plan for additional details.    Past Medical History:  Diagnosis Date  . Allergic rhinitis   . Anxiety   . Arthritis   . Chronic back pain   . CKD (chronic kidney disease), stage III    folllowed by pcp  . Depression   . Diabetic neuropathy ([place])   . Environmental allergies   . Fibromyalgia   . Frequency of urination   . History of chronic bronchitis   . History of drug abuse ([place]) states quit herion in 2006  . History of hepatitis C last abd ultrasound in epic 11-10-2018;  last hepative panel in epic 11-30-2018 was normal   previously followed by [facility] (notes in care everywhere)-- dx 04/ 2015,  started harvonic treatment 07/ 2015  . Hypertension    followed by pcp  (nuclear stress test 01-15-2017 in epic,  showed low risk normal w/ nuclear ef 55%)  . Methadone maintenance therapy patient ([place])   . Mild asthma    followed by pcp  . Nocturia   . PAD (peripheral artery disease) ([place])    followed by cardiology, dr Fletcher Anon---  06-01-2019  s/p bilateral CIA angioplasty stenting  . Right foot ulcer ([place])   . Tendonitis    bilateral wrist  . Type 2 diabetes mellitus ([place])    followed by pcp   (06-16-2019  per pt checks blood sugar every other day in am,  fasting sugar 92-95)  . Urge urinary incontinence     Family History  Problem Relation Age of Onset  . Hypertension Mother   . Diabetes type II Mother   . Heart attack Mother 8  . CAD Father   . Heart attack Father 81  . Hypertension Brother   . Diabetes type II Brother   . Heart attack Brother 67  . Diabetes type II Sister    Social History   Socioeconomic History  . Marital status: Single    Spouse name: Not on file  . Number of children: Not on file  . Years of education: Not on file  . Highest  education level: Not on file  Occupational History  . Not on file  Tobacco Use  . Smoking status: Current Every Day Smoker    Packs/day: 0.40    Years: 20.00    Pack years: 8.00    Types: Cigarettes  . Smokeless tobacco: Never Used  . Tobacco comment: 6-7 cigs/day.  Substance and Sexual Activity  . Alcohol use: No    Alcohol/week: 0.0 standard drinks  . Drug use: Not Currently    Comment: hx heroin use, IV,  last used 2006 (currently on methadone)  . Sexual activity: Never  Other Topics Concern  . Not on file  Social History Narrative  . Not on file   Social Determinants of Health   Financial Resource Strain:   . Difficulty of Paying Living Expenses:   Food Insecurity:   . Worried About Charity fundraiser in the Last Year:   . Arboriculturist in the Last Year:   Transportation Needs:   . Film/video editor (Medical):   [name] Lack of Transportation (Non-Medical):   Physical Activity:   . Days of Exercise per Week:   . Minutes of Exercise per Session:  Stress:   . Feeling of Stress :   Social Connections:   . Frequency of Communication with Friends and Family:   . Frequency of Social Gatherings with Friends and Family:   . Attends Religious Services:   . Active Member of Clubs or Organizations:   . Attends Archivist Meetings:   [name] Marital Status:   Intimate Partner Violence:   . Fear of Current or Ex-Partner:   . Emotionally Abused:   [name] Physically Abused:   . Sexually Abused:      Review of Systems: ROS - negative with the exception of what is noted on a/p.   Vitals:   08/18/19 1542  BP: (!) 143/58  Pulse: 68  Temp: 99.3 F (37.4 C)  TempSrc: Oral  SpO2: 98%  Weight: 165 lb 8 oz (75.1 kg)  Height: 5\' 3"  (1.6 m)     Physical Exam: Physical Exam  Constitutional: She is oriented to person, place, and time and well-developed, well-nourished, and in no distress.  HENT:  Head: Normocephalic and atraumatic.  Eyes: EOM are normal.  Neck: No  tracheal deviation present.  Cardiovascular: Normal rate and intact distal pulses. Exam reveals no gallop and no friction rub.  No murmur heard. Pulmonary/Chest: Effort normal and breath sounds normal. No respiratory distress.  Abdominal: Soft. Bowel sounds are normal. She exhibits no distension. There is no abdominal tenderness.  Musculoskeletal:        General: Edema (bilateral) present. No tenderness. Normal range of motion.  Neurological: She is alert and oriented to person, place, and time.  Skin: Skin is warm and dry.  Wound on right foot.      Assessment & Plan:   See Encounters Tab for problem based charting.  Patient discussed with Dr. Evette Doffing

## 2019-08-18 NOTE — Assessment & Plan Note (Signed)
Spoke to the patient in depth about her past history of tobacco use disorder.  She states that she is tried nicotine patches with some success but this was not sustainable as she ran out of patches within a month.  I counseled her on Chantix and believes she could be a good candidate.  She will need repeat renal and liver function prior to starting medication.  I told her that we will consider this after her operation.  I also counseled her on the importance of decreasing cigarette smoke in the perioperative time.  As it will hinder her ability to heal.  She missed understanding.

## 2019-08-18 NOTE — Assessment & Plan Note (Signed)
Patient states that her depression symptoms have improved since medication management.  She does state that she feels felt fatigued and believes that her medications may be contributing to this.  I counseled her on the importance of limiting centrally acting medications moving forward to decrease her risk of fall.  Looking at her medication list, she is currently taking 2 serotonergic medications consisting of duloxetine and citalopram.  She states that she does not think that she has a lot of improvement on the duloxetine.  Therefore to limit her risk of serotonin syndrome as well as functional impairment I will discontinue Cymbalta today.  She is also taking other centrally acting medications and atypical antidepressants that will need to be reevaluated her follow-up appointment.

## 2019-08-19 NOTE — Progress Notes (Signed)
Internal Medicine Clinic Attending  Case discussed with Dr. Coe at the time of the visit.  We reviewed the resident's history and exam and pertinent patient test results.  I agree with the assessment, diagnosis, and plan of care documented in the resident's note.    

## 2019-08-19 NOTE — Progress Notes (Signed)
H and P received for 08-24-2019 surgery and placed on patient chart

## 2019-08-20 ENCOUNTER — Other Ambulatory Visit (HOSPITAL_COMMUNITY)
Admission: RE | Admit: 2019-08-20 | Discharge: 2019-08-20 | Disposition: A | Discharge status: Home / Self Care | Payer: Medicare Other | Source: Ambulatory Visit | Attending: Podiatry | Admitting: Podiatry

## 2019-08-20 DIAGNOSIS — Z20822 Contact with and (suspected) exposure to covid-19: Secondary | ICD-10-CM | POA: Diagnosis not present

## 2019-08-20 DIAGNOSIS — Z01812 Encounter for preprocedural laboratory examination: Secondary | ICD-10-CM | POA: Diagnosis not present

## 2019-08-20 LAB — SARS CORONAVIRUS 2 (TAT 6-24 HRS): SARS Coronavirus 2: NEGATIVE

## 2019-08-24 ENCOUNTER — Ambulatory Visit (HOSPITAL_BASED_OUTPATIENT_CLINIC_OR_DEPARTMENT_OTHER)
Admission: RE | Admit: 2019-08-24 | Discharge: 2019-08-24 | Disposition: A | Discharge status: Home / Self Care | Payer: Medicare Other | Attending: Podiatry | Admitting: Podiatry

## 2019-08-24 ENCOUNTER — Ambulatory Visit (HOSPITAL_BASED_OUTPATIENT_CLINIC_OR_DEPARTMENT_OTHER): Payer: Medicare Other | Admitting: Physician Assistant

## 2019-08-24 ENCOUNTER — Encounter (HOSPITAL_BASED_OUTPATIENT_CLINIC_OR_DEPARTMENT_OTHER)
Admission: RE | Disposition: A | Discharge status: Home / Self Care | Payer: Self-pay | Source: Home / Self Care | Attending: Podiatry

## 2019-08-24 ENCOUNTER — Other Ambulatory Visit: Payer: Self-pay

## 2019-08-24 ENCOUNTER — Other Ambulatory Visit: Payer: Self-pay | Admitting: Internal Medicine

## 2019-08-24 ENCOUNTER — Encounter (HOSPITAL_BASED_OUTPATIENT_CLINIC_OR_DEPARTMENT_OTHER): Payer: Self-pay | Admitting: Podiatry

## 2019-08-24 DIAGNOSIS — E785 Hyperlipidemia, unspecified: Secondary | ICD-10-CM | POA: Diagnosis not present

## 2019-08-24 DIAGNOSIS — F17200 Nicotine dependence, unspecified, uncomplicated: Secondary | ICD-10-CM | POA: Diagnosis not present

## 2019-08-24 DIAGNOSIS — E11621 Type 2 diabetes mellitus with foot ulcer: Secondary | ICD-10-CM | POA: Diagnosis not present

## 2019-08-24 DIAGNOSIS — J45909 Unspecified asthma, uncomplicated: Secondary | ICD-10-CM | POA: Insufficient documentation

## 2019-08-24 DIAGNOSIS — B1920 Unspecified viral hepatitis C without hepatic coma: Secondary | ICD-10-CM | POA: Diagnosis not present

## 2019-08-24 DIAGNOSIS — L97512 Non-pressure chronic ulcer of other part of right foot with fat layer exposed: Secondary | ICD-10-CM | POA: Diagnosis not present

## 2019-08-24 DIAGNOSIS — L97519 Non-pressure chronic ulcer of other part of right foot with unspecified severity: Secondary | ICD-10-CM | POA: Diagnosis not present

## 2019-08-24 DIAGNOSIS — Z7984 Long term (current) use of oral hypoglycemic drugs: Secondary | ICD-10-CM | POA: Diagnosis not present

## 2019-08-24 DIAGNOSIS — I10 Essential (primary) hypertension: Secondary | ICD-10-CM | POA: Insufficient documentation

## 2019-08-24 DIAGNOSIS — E08621 Diabetes mellitus due to underlying condition with foot ulcer: Secondary | ICD-10-CM | POA: Diagnosis not present

## 2019-08-24 DIAGNOSIS — M62838 Other muscle spasm: Secondary | ICD-10-CM

## 2019-08-24 DIAGNOSIS — G709 Myoneural disorder, unspecified: Secondary | ICD-10-CM | POA: Diagnosis not present

## 2019-08-24 HISTORY — PX: WOUND DEBRIDEMENT: SHX247

## 2019-08-24 LAB — POCT I-STAT, CHEM 8
BUN: 17 mg/dL (ref 8–23)
Calcium, Ion: 1.15 mmol/L (ref 1.15–1.40)
Chloride: 94 mmol/L — ABNORMAL LOW (ref 98–111)
Creatinine, Ser: 1.5 mg/dL — ABNORMAL HIGH (ref 0.44–1.00)
Glucose, Bld: 124 mg/dL — ABNORMAL HIGH (ref 70–99)
HCT: 37 % (ref 36.0–46.0)
Hemoglobin: 12.6 g/dL (ref 12.0–15.0)
Potassium: 3.5 mmol/L (ref 3.5–5.1)
Sodium: 132 mmol/L — ABNORMAL LOW (ref 135–145)
TCO2: 32 mmol/L (ref 22–32)

## 2019-08-24 LAB — GLUCOSE, CAPILLARY: Glucose-Capillary: 154 mg/dL — ABNORMAL HIGH (ref 70–99)

## 2019-08-24 SURGERY — DEBRIDEMENT, WOUND
Anesthesia: Monitor Anesthesia Care | Site: Foot | Laterality: Right

## 2019-08-24 MED ORDER — CEFAZOLIN SODIUM-DEXTROSE 2-4 GM/100ML-% IV SOLN
2.0000 g | INTRAVENOUS | Status: AC
  Administered 2019-08-24: 2 g via INTRAVENOUS

## 2019-08-24 MED ORDER — LIDOCAINE 2% (20 MG/ML) 5 ML SYRINGE
INTRAMUSCULAR | Status: DC | PRN
  Administered 2019-08-24: 50 mg via INTRAVENOUS

## 2019-08-24 MED ORDER — PROPOFOL 500 MG/50ML IV EMUL
INTRAVENOUS | Status: AC
  Filled 2019-08-24: qty 50

## 2019-08-24 MED ORDER — SODIUM CHLORIDE 0.9 % IV SOLN
INTRAVENOUS | Status: DC
  Administered 2019-08-24: 50 mL/h via INTRAVENOUS

## 2019-08-24 MED ORDER — KETOROLAC TROMETHAMINE 30 MG/ML IJ SOLN: 30.0000 mg | Freq: Once | INTRAMUSCULAR | Status: DC | PRN

## 2019-08-24 MED ORDER — CEFAZOLIN SODIUM-DEXTROSE 2-4 GM/100ML-% IV SOLN
INTRAVENOUS | Status: AC
  Filled 2019-08-24: qty 100

## 2019-08-24 MED ORDER — PROPOFOL 500 MG/50ML IV EMUL
INTRAVENOUS | Status: DC | PRN
  Administered 2019-08-24: 50 ug/kg/min via INTRAVENOUS

## 2019-08-24 MED ORDER — CEPHALEXIN 500 MG PO CAPS
500.0000 mg | ORAL_CAPSULE | Freq: Two times a day (BID) | ORAL | 0 refills | Status: DC
Start: 2019-08-24 — End: 2019-09-15

## 2019-08-24 MED ORDER — ONDANSETRON HCL 4 MG/2ML IJ SOLN: 4.0000 mg | Freq: Once | INTRAMUSCULAR | Status: DC | PRN

## 2019-08-24 MED ORDER — BUPIVACAINE HCL (PF) 0.5 % IJ SOLN
INTRAMUSCULAR | Status: DC | PRN
  Administered 2019-08-24: 10 mL

## 2019-08-24 MED ORDER — LIDOCAINE 2% (20 MG/ML) 5 ML SYRINGE
INTRAMUSCULAR | Status: AC
  Filled 2019-08-24: qty 5

## 2019-08-24 MED ORDER — PROPOFOL 10 MG/ML IV BOLUS
INTRAVENOUS | Status: DC | PRN
  Administered 2019-08-24: 50 mg via INTRAVENOUS

## 2019-08-24 SURGICAL SUPPLY — 61 items
APL PRP STRL LF DISP 70% ISPRP (MISCELLANEOUS)
BLADE SURG 15 STRL LF DISP TIS (BLADE) ×1 IMPLANT
BLADE SURG 15 STRL SS (BLADE) ×3
BNDG CMPR 9X4 STRL LF SNTH (GAUZE/BANDAGES/DRESSINGS)
BNDG ELASTIC 3X5.8 VLCR STR LF (GAUZE/BANDAGES/DRESSINGS) ×1 IMPLANT
BNDG ELASTIC 4X5.8 VLCR STR LF (GAUZE/BANDAGES/DRESSINGS) ×3 IMPLANT
BNDG ESMARK 4X9 LF (GAUZE/BANDAGES/DRESSINGS) IMPLANT
BNDG GAUZE ELAST 4 BULKY (GAUZE/BANDAGES/DRESSINGS) ×3 IMPLANT
CHLORAPREP W/TINT 26 (MISCELLANEOUS) IMPLANT
CNTNR URN SCR LID CUP LEK RST (MISCELLANEOUS) IMPLANT
CONT SPEC 4OZ STRL OR WHT (MISCELLANEOUS)
COVER BACK TABLE 60X90IN (DRAPES) ×3 IMPLANT
COVER WAND RF STERILE (DRAPES) ×3 IMPLANT
CUFF TOURN SGL QUICK 18X4 (TOURNIQUET CUFF) ×2 IMPLANT
CUFF TOURN SGL QUICK 24 (TOURNIQUET CUFF)
CUFF TRNQT CYL 24X4X16.5-23 (TOURNIQUET CUFF) IMPLANT
DRAPE 3/4 80X56 (DRAPES) ×3 IMPLANT
DRAPE EXTREMITY T 121X128X90 (DISPOSABLE) ×3 IMPLANT
DRAPE SHEET LG 3/4 BI-LAMINATE (DRAPES) ×3 IMPLANT
DRAPE U-SHAPE 47X51 STRL (DRAPES) ×1 IMPLANT
DRSG EMULSION OIL 3X3 NADH (GAUZE/BANDAGES/DRESSINGS) ×2 IMPLANT
ELECT REM PT RETURN 9FT ADLT (ELECTROSURGICAL) ×3
ELECTRODE REM PT RTRN 9FT ADLT (ELECTROSURGICAL) ×1 IMPLANT
GAUZE SPONGE 4X4 12PLY STRL (GAUZE/BANDAGES/DRESSINGS) ×3 IMPLANT
GAUZE SPONGE 4X4 12PLY STRL LF (GAUZE/BANDAGES/DRESSINGS) ×2 IMPLANT
GAUZE XEROFORM 1X8 LF (GAUZE/BANDAGES/DRESSINGS) IMPLANT
GLOVE BIO SURGEON STRL SZ7.5 (GLOVE) ×3 IMPLANT
GLOVE BIOGEL PI IND STRL 8 (GLOVE) ×1 IMPLANT
GLOVE BIOGEL PI INDICATOR 8 (GLOVE) ×2
GOWN STRL REUS W/ TWL XL LVL3 (GOWN DISPOSABLE) ×1 IMPLANT
GOWN STRL REUS W/TWL XL LVL3 (GOWN DISPOSABLE) ×3
MANIFOLD NEPTUNE II (INSTRUMENTS) ×1 IMPLANT
MATRIX WOUND MESHED 2X2 (Tissue) IMPLANT
NDL HYPO 25X1 1.5 SAFETY (NEEDLE) ×1 IMPLANT
NEEDLE HYPO 25X1 1.5 SAFETY (NEEDLE) ×3 IMPLANT
NS IRRIG 1000ML POUR BTL (IV SOLUTION) IMPLANT
PADDING CAST ABS 4INX4YD NS (CAST SUPPLIES)
PADDING CAST ABS COTTON 4X4 ST (CAST SUPPLIES) ×1 IMPLANT
PENCIL SMOKE EVAC W/HOLSTER (ELECTROSURGICAL) ×3 IMPLANT
PROBE DEBRIDE SONICVAC MISONIX (TIP) ×2 IMPLANT
SET BASIN DAY SURGERY F.S. (CUSTOM PROCEDURE TRAY) ×3 IMPLANT
SET IRRIG Y TYPE TUR BLADDER L (SET/KITS/TRAYS/PACK) IMPLANT
SPONGE LAP 4X18 RFD (DISPOSABLE) ×1 IMPLANT
STAPLER VISISTAT 35W (STAPLE) IMPLANT
STOCKINETTE 6  STRL (DRAPES) ×3
STOCKINETTE 6 STRL (DRAPES) ×1 IMPLANT
SUCTION FRAZIER HANDLE 10FR (MISCELLANEOUS)
SUCTION TUBE FRAZIER 10FR DISP (MISCELLANEOUS) IMPLANT
SUT CHROMIC 4 0 PS 2 18 (SUTURE) ×2 IMPLANT
SUT ETHILON 4 0 PS 2 18 (SUTURE) IMPLANT
SUT MNCRL AB 3-0 PS2 18 (SUTURE) IMPLANT
SUT MNCRL AB 4-0 PS2 18 (SUTURE) IMPLANT
SUT VIC AB 2-0 SH 27 (SUTURE)
SUT VIC AB 2-0 SH 27XBRD (SUTURE) IMPLANT
SYR BULB EAR ULCER 3OZ GRN STR (SYRINGE) ×3 IMPLANT
SYR CONTROL 10ML LL (SYRINGE) ×3 IMPLANT
TRAY DSU PREP LF (CUSTOM PROCEDURE TRAY) ×3 IMPLANT
TUBE IRRIGATION SET MISONIX (TUBING) ×2 IMPLANT
UNDERPAD 30X36 HEAVY ABSORB (UNDERPADS AND DIAPERS) ×3 IMPLANT
WOUND MATRIX MESHED 2X2 (Tissue) ×2 IMPLANT
YANKAUER SUCT BULB TIP NO VENT (SUCTIONS) ×3 IMPLANT

## 2019-08-24 NOTE — H&P (Signed)
  Subjective:  Patient ID: Rose [name], female    DOB: 07/26/1955,  MRN: [id number]  Denies significant changes since last eval. Understands plan for OR. Objective:  There were no vitals filed for this visit. General AA&O x3. Normal mood and affect.  Vascular Dorsalis pedis and posterior tibial pulses 2/4 bilat. Brisk capillary refill to all digits. Pedal hair present.  Neurologic Epicritic sensation grossly intact.  Dermatologic R foot wound dressing intact  Orthopedic: MMT 5/5 in dorsiflexion, plantarflexion, inversion, and eversion. Normal joint ROM without pain or crepitus.    Assessment & Plan:  Patient was evaluated and treated and all questions answered.  Right foot wound -Proceed to OR today for debridement and skin graft substitute application. -Consent reviewed and signed -RLE marked.  Evelina Bucy, DPM  Accessible via secure chat for questions or concerns.

## 2019-08-24 NOTE — Anesthesia Postprocedure Evaluation (Signed)
Anesthesia Post Note  Patient: Rose [name]  Procedure(s) Performed: DEBRIDEMENT RIGHT FOOT WOUND; APPLICATION OF SKIN GRAFT SUBSTITUTE (Right Foot)     Patient location during evaluation: Phase II Anesthesia Type: MAC Level of consciousness: awake Pain management: pain level controlled Vital Signs Assessment: post-procedure vital signs reviewed and stable Respiratory status: spontaneous breathing Cardiovascular status: stable Postop Assessment: no apparent nausea or vomiting Anesthetic complications: no    Last Vitals:  Vitals:   08/24/19 1000 08/24/19 1015  BP: 133/61 (!) 147/68  Pulse: 60 60  Resp: 10 11  Temp:    SpO2: 96% 98%    Last Pain:  Vitals:   08/24/19 1015  TempSrc:   PainSc: 0-No pain   Pain Goal: Patients Stated Pain Goal: 5 (08/24/19 0836)                 Huston Foley

## 2019-08-24 NOTE — Transfer of Care (Signed)
Immediate Anesthesia Transfer of Care Note  Patient: Rose [name]  Procedure(s) Performed: Procedure(s) (LRB): DEBRIDEMENT RIGHT FOOT WOUND; APPLICATION OF SKIN GRAFT SUBSTITUTE (Right)  Patient Location: PACU  Anesthesia Type: MAC  Level of Consciousness: awake, alert , oriented and patient cooperative  Airway & Oxygen Therapy: Patient Spontanous Breathing and Patient connected to face mask oxygen  Post-op Assessment: Report given to PACU RN and Post -op Vital signs reviewed and stable  Post vital signs: Reviewed and stable  Complications: No apparent anesthesia complications Last Vitals:  Vitals Value Taken Time  BP 132/64 08/24/19 0928  Temp    Pulse 64 08/24/19 0930  Resp 18 08/24/19 0930  SpO2 98 % 08/24/19 0930  Vitals shown include unvalidated device data.  Last Pain:  Vitals:   08/24/19 0836  TempSrc: Oral  PainSc: 0-No pain      Patients Stated Pain Goal: 5 (08/24/19 0836)

## 2019-08-24 NOTE — Anesthesia Preprocedure Evaluation (Signed)
Anesthesia Evaluation  Patient identified by MRN, date of birth, ID band Patient awake    Reviewed: Allergy & Precautions, NPO status , Patient's Chart, lab work & pertinent test results, reviewed documented beta blocker date and time   Airway Mallampati: I       Dental no notable dental hx. (+) Teeth Intact   Pulmonary asthma , Current Smoker and Patient abstained from smoking.,    Pulmonary exam normal breath sounds clear to auscultation       Cardiovascular hypertension, Pt. on home beta blockers Normal cardiovascular exam Rhythm:Regular Rate:Normal     Neuro/Psych PSYCHIATRIC DISORDERS Anxiety Depression  Neuromuscular disease    GI/Hepatic negative GI ROS, (+) Hepatitis -, C  Endo/Other  diabetes, Type 2, Oral Hypoglycemic Agents  Renal/GU Renal InsufficiencyRenal disease  negative genitourinary   Musculoskeletal   Abdominal Normal abdominal exam  (+)   Peds  Hematology negative hematology ROS (+)   Anesthesia Other Findings   Reproductive/Obstetrics                             Anesthesia Physical Anesthesia Plan  ASA: III  Anesthesia Plan: MAC   Post-op Pain Management:    Induction:   PONV Risk Score and Plan: 1 and Ondansetron  Airway Management Planned: Natural Airway, Simple Face Mask and Nasal Cannula  Additional Equipment: None  Intra-op Plan:   Post-operative Plan:   Informed Consent: I have reviewed the patients History and Physical, chart, labs and discussed the procedure including the risks, benefits and alternatives for the proposed anesthesia with the patient or authorized representative who has indicated his/her understanding and acceptance.       Plan Discussed with: CRNA  Anesthesia Plan Comments:         Anesthesia Quick Evaluation

## 2019-08-24 NOTE — Discharge Instructions (Signed)

## 2019-08-24 NOTE — Op Note (Signed)
Patient Name: Rose [name] DOB: May 26, 1955  MRN: [id number]   Date of Service: 08/24/19    Surgeon: Dr. Hardie Pulley, DPM Assistants: None Pre-operative Diagnosis: diabetic ulcer right foot Post-operative Diagnosis: same Procedures:             1) Debridement and irrigation right foot wound  2) Applicaton of skin graft substitute Pathology/Specimens: * No specimens in log * Anesthesia: MAC/local Hemostasis: Anatomic Estimated Blood Loss: 5 ml Materials: None Medications: none Complications: None  Indications for Procedure:  This is a 64 y.o. female with a chronic RLE wound. She presents today for debridement to enhance healing of the wound.   Procedure in Detail: Patient was identified in pre-operative holding area. Formal consent was signed and the right lower extremity was marked. Patient was brought back to the operating room and placed on the operating room table in the supine position. Anesthesia was induced.   The extremity was prepped and draped in the usual sterile fashion. Timeout was taken to confirm patient name, laterality, and procedure prior to incision. Attention was then directed to the right foot where a wound measuring 0.6x0.8 was encountered.  The wound was sharply excisionally debrided with a 15 blade, followed by a misonix ultrasonic debrider. Debridement was performed to bleeding, viable wound base. The wound was debrided to the level of the fascial tissue. Following debridement the wound measured 1x1.3.  An integra bilayer graft was soaked and applied to the wound bed and sutured into position with 4-0 chromic suture.  The foot was then dressed with 4x4, kerlix, and ACE bandage. Patient tolerated the procedure well.   Disposition: Following a period of post-operative monitoring, patient will be transferred back home.

## 2019-09-01 ENCOUNTER — Other Ambulatory Visit: Payer: Self-pay

## 2019-09-01 ENCOUNTER — Ambulatory Visit (INDEPENDENT_AMBULATORY_CARE_PROVIDER_SITE_OTHER): Payer: Medicare Other | Admitting: Podiatry

## 2019-09-01 DIAGNOSIS — L97512 Non-pressure chronic ulcer of other part of right foot with fat layer exposed: Secondary | ICD-10-CM | POA: Diagnosis not present

## 2019-09-01 DIAGNOSIS — E08621 Diabetes mellitus due to underlying condition with foot ulcer: Secondary | ICD-10-CM

## 2019-09-01 NOTE — Progress Notes (Signed)
  Subjective:  Patient ID: Rose [name], female    DOB: [birth year],  MRN: [id number]  No chief complaint on file.   64 y.o. female presents for wound care. Hx confirmed with patient. Objective:  Physical Exam: Wound Location: right lateral 5th Wound Measurement: UTA (graft intact) Wound Base: UTA (graft intact) Peri-wound: Normal Exudate: None: wound tissue dry wound without warmth, erythema, signs of acute infection  Assessment:   1. Diabetic ulcer of other part of right foot associated with diabetes mellitus due to underlying condition, with fat layer exposed ([place])      Plan:  Patient was evaluated and treated and all questions answered.  Ulcer right foot -Wound graft hydrated with hydrogel. Wound healing well without signs of acute infection -F/u in 1 week for removal of graft.  No follow-ups on file.

## 2019-09-08 ENCOUNTER — Encounter: Payer: Self-pay | Admitting: Podiatry

## 2019-09-08 ENCOUNTER — Other Ambulatory Visit: Payer: Self-pay

## 2019-09-08 ENCOUNTER — Ambulatory Visit (INDEPENDENT_AMBULATORY_CARE_PROVIDER_SITE_OTHER): Payer: Medicare Other | Admitting: Podiatry

## 2019-09-08 DIAGNOSIS — E08621 Diabetes mellitus due to underlying condition with foot ulcer: Secondary | ICD-10-CM

## 2019-09-08 DIAGNOSIS — L97512 Non-pressure chronic ulcer of other part of right foot with fat layer exposed: Secondary | ICD-10-CM | POA: Diagnosis not present

## 2019-09-08 NOTE — Progress Notes (Signed)
  Subjective:  Patient ID: Rose [name], female    DOB: [birth year],  MRN: [id number]  Chief Complaint  Patient presents with  . Post-op Problem    POV #2 DOS 08/24/19 DEBRIDE WOUND RT FOOT, APPLICATION OF SKIN GRAFT SUBSTITUTE- Pt states shes doing well, has notcied some swelling but has not had pain. completed antibiotic treatment. concern with the healing proccess.     64 y.o. female presents for wound care. Hx confirmed with patient. Objective:  Physical Exam: Wound Location: right lateral 5th Wound Measurement: 1x0.5 Wound Base: granular Peri-wound: Normal Exudate: None: wound tissue dry wound without warmth, erythema, signs of acute infection  Assessment:   1. Diabetic ulcer of other part of right foot associated with diabetes mellitus due to underlying condition, with fat layer exposed ([place])      Plan:  Patient was evaluated and treated and all questions answered.  Ulcer right foot -Graft removed. -Wound improved since surgery with graft application. -F/u in 3 weeks. -Dressed with silvadene and band-aid -Will pre-cert for graft application in office.  No follow-ups on file.

## 2019-09-12 NOTE — Progress Notes (Signed)
  Subjective:  Patient ID: Rose [name], female    DOB: 1955/07/26,  MRN: [id number]  Chief Complaint  Patient presents with  . Wound Check    R foot. Pt stated, "The thing that Dr. March Rummage put on my foot fell off [4 days ago]. I've been cleaning and drying it myself. No pain in the wound. I think I saw some pus. No foul odor/fever/chills/N&V. The rest of my foot cramps sometimes".    64 y.o. female presents for wound care. Hx confirmed with patient.  Objective:  Physical Exam: Wound Location: right foot lateral 5th MPJ Wound Measurement: 0.8x1 Wound Base: UTA Peri-wound: Normal Exudate: None: wound tissue dry wound without warmth, erythema, signs of acute infection  Assessment:   1. Diabetic ulcer of other part of right foot associated with diabetes mellitus due to underlying condition, with fat layer exposed ([place])      Plan:  Patient was evaluated and treated and all questions answered.  Ulcer Right Foot -Wound improving. Would benefit from repeat application -Dressed with hydrogel and mepilex border  No follow-ups on file.

## 2019-09-15 ENCOUNTER — Ambulatory Visit (INDEPENDENT_AMBULATORY_CARE_PROVIDER_SITE_OTHER): Payer: Medicare Other | Admitting: Internal Medicine

## 2019-09-15 ENCOUNTER — Other Ambulatory Visit: Payer: Self-pay

## 2019-09-15 VITALS — BP 143/65 | HR 76 | Temp 97.9°F | Ht 63.0 in | Wt 169.0 lb

## 2019-09-15 DIAGNOSIS — F17200 Nicotine dependence, unspecified, uncomplicated: Secondary | ICD-10-CM | POA: Diagnosis not present

## 2019-09-15 DIAGNOSIS — G629 Polyneuropathy, unspecified: Secondary | ICD-10-CM

## 2019-09-15 DIAGNOSIS — E1142 Type 2 diabetes mellitus with diabetic polyneuropathy: Secondary | ICD-10-CM

## 2019-09-15 MED ORDER — NICOTINE 14 MG/24HR TD PT24: MEDICATED_PATCH | TRANSDERMAL | 2 refills | Status: DC

## 2019-09-15 MED ORDER — DULOXETINE HCL 20 MG PO CPEP: 20.0000 mg | ORAL_CAPSULE | Freq: Every day | ORAL | 2 refills | Status: DC

## 2019-09-15 NOTE — Progress Notes (Signed)
Internal Medicine Clinic Attending  Case discussed with Dr. Alexander at the time of the visit.  We reviewed the resident's history and exam and pertinent patient test results.  I agree with the assessment, diagnosis, and plan of care documented in the resident's note.  

## 2019-09-15 NOTE — Progress Notes (Signed)
   CC: Bilateral foot pain  HPI: [name] is a 64 y.o. with a history as noted below who presents today for pain in her feet.  Please refer to the problem based charting for further details.  Past Medical History:  Diagnosis Date  . Allergic rhinitis   . Anxiety   . Arthritis   . Chronic back pain   . CKD (chronic kidney disease), stage III    folllowed by pcp  . Depression   . Diabetic neuropathy ([place])   . Environmental allergies   . Fibromyalgia   . Frequency of urination   . History of chronic bronchitis   . History of drug abuse ([place]) states quit herion in 2006  . History of hepatitis C last abd ultrasound in epic 11-10-2018;  last hepative panel in epic 11-30-2018 was normal   previously followed by [facility] (notes in care everywhere)-- dx 04/ 2015,  started harvonic treatment 07/ 2015  . Hypertension    followed by pcp  (nuclear stress test 01-15-2017 in epic,  showed low risk normal w/ nuclear ef 55%)  . Methadone maintenance therapy patient ([place])   . Mild asthma    followed by pcp  . Nocturia   . PAD (peripheral artery disease) ([place])    followed by cardiology, dr Fletcher Anon---  06-01-2019  s/p bilateral CIA angioplasty stenting  . Right foot ulcer ([place])   . Tendonitis    bilateral wrist  . Type 2 diabetes mellitus ([place])    followed by pcp   (06-16-2019  per pt checks blood sugar every other day in am,  fasting sugar 92-95)  . Urge urinary incontinence    Review of Systems: All systems were reviewed and are otherwise negative unless mentioned in the HPI  Physical Exam:  Vitals:   09/15/19 1459  BP: (!) 143/65  Pulse: 76  Temp: 97.9 F (36.6 C)  TempSrc: Oral  SpO2: 100%  Weight: 169 lb (76.7 kg)  Height: 5\' 3"  (1.6 m)   Physical Exam Constitutional:      General: She is not in acute distress.    Appearance: Normal appearance. She is obese. She is not ill-appearing, toxic-appearing or diaphoretic.  HENT:     Head:  Normocephalic.  Cardiovascular:     Rate and Rhythm: Normal rate and regular rhythm.     Pulses: Normal pulses.     Heart sounds: Normal heart sounds. No murmur heard.  No friction rub. No gallop.   Pulmonary:     Effort: Pulmonary effort is normal. No respiratory distress.     Breath sounds: Normal breath sounds. No wheezing or rales.  Musculoskeletal:     Right lower leg: No edema.     Left lower leg: No edema.     Comments: Boot on R foot  Neurological:     Mental Status: She is alert. Mental status is at baseline.     Sensory: Sensory deficit (diminished sensation go gross touch on bilateral feet) present.  Psychiatric:        Mood and Affect: Mood normal.    Assessment & Plan:   See Encounters Tab for problem based charting.  Patient discussed with Dr. Philipp Ovens

## 2019-09-15 NOTE — Patient Instructions (Addendum)
Thank you for visiting Rose [name] in clinic today.  Below is a summary of what we discussed:  1.  Feet pain -Your feet pain is likely due to neuropathy from her diabetes.  However, vitamin B12 deficiency is also a potential cause, so we will get a blood test to measure your vitamin B12 levels. -Start taking Cymbalta 20 mg daily.  This medication will treat both your feet pain and depression. -Stop taking citalopram -Please follow-up with the podiatrist on July 1st  2.  Follow-up -See Rose [name] again in 4 weeks so we can track your progress and adjust medication doses.  If you have any questions or concerns in the meantime, please feel free to reach out to Rose [name].

## 2019-09-16 ENCOUNTER — Ambulatory Visit: Payer: Medicare Other | Admitting: Podiatry

## 2019-09-16 LAB — VITAMIN B12: Vitamin B-12: 1356 pg/mL — ABNORMAL HIGH (ref 232–1245)

## 2019-09-16 LAB — TSH: TSH: 4.27 u[IU]/mL (ref 0.450–4.500)

## 2019-09-16 NOTE — Assessment & Plan Note (Signed)
The patient states she continues to smoke cigarettes and is requesting nicotine patches.  She says that she smokes about 8 cigarettes a day.  Plan: -Nicotine patch is ordered

## 2019-09-16 NOTE — Assessment & Plan Note (Addendum)
Patient reports having stinging and tingling sensations in her feet and hands.  Patient states that the pain is 5/10 in severity.  The pain does not seem to be triggered or relieved by anything.  The patient states she has been on gabapentin and Lyrica in the past but had an allergic reaction to gabapentin, and did not tolerate the Lyrica very well.  The patient is not currently on any neuropathic agents.  Plan: -Stop citalopram today -Start duloxetine 20 mg -Check B12 levels -Consider increasing duloxetine and/or starting capsaicin cream at next visit if no significant improvement is made.  --F/u in 4 weeks

## 2019-09-23 ENCOUNTER — Other Ambulatory Visit: Payer: Self-pay | Admitting: Internal Medicine

## 2019-09-23 DIAGNOSIS — M62838 Other muscle spasm: Secondary | ICD-10-CM

## 2019-09-23 DIAGNOSIS — I5032 Chronic diastolic (congestive) heart failure: Secondary | ICD-10-CM

## 2019-09-29 ENCOUNTER — Ambulatory Visit (INDEPENDENT_AMBULATORY_CARE_PROVIDER_SITE_OTHER): Payer: Medicare Other | Admitting: Podiatry

## 2019-09-29 ENCOUNTER — Other Ambulatory Visit: Payer: Self-pay

## 2019-09-29 VITALS — Temp 97.1°F

## 2019-09-29 DIAGNOSIS — E08621 Diabetes mellitus due to underlying condition with foot ulcer: Secondary | ICD-10-CM

## 2019-09-29 DIAGNOSIS — L97512 Non-pressure chronic ulcer of other part of right foot with fat layer exposed: Secondary | ICD-10-CM | POA: Diagnosis not present

## 2019-09-29 NOTE — Progress Notes (Signed)
  Subjective:  Patient ID: Rose [name], female    DOB: 10-09-1955,  MRN: [id number]  Chief Complaint  Patient presents with  . Wound Check    R plantar forefoot submet 5. Pt stated, "No pain from the wound. No fever/chills/N&V/foul odor. I see pus when I change the band-aids (every day or every other day). I have occasional, brief, severe pain [plantar midfoot] in a spot that is the size of a quarter".    64 y.o. female presents for wound care. Hx confirmed with patient. Objective:  Physical Exam: Wound Location: right lateral 5th Wound Measurement: 0.3x1 Wound Base: fibrogranular Peri-wound: Normal Exudate: None: wound tissue dry wound without warmth, erythema, signs of acute infection  Assessment:   1. Diabetic ulcer of other part of right foot associated with diabetes mellitus due to underlying condition, with fat layer exposed ([place])    Plan:  Patient was evaluated and treated and all questions answered.  Ulcer right foot -Wound improving. -Wound debrided as below -Will precert for in-office wound graft.  Procedure: Selective Debridement of Wound Rationale: Removal of devitalized tissue from the wound to promote healing.  Pre-Debridement Wound Measurements: 0.3 cm x 1 cm x 0.1 cm  Post-Debridement Wound Measurements: same as pre-debridement. Type of Debridement: sharp selective Tissue Removed: Devitalized soft-tissue Dressing: Dry, sterile, compression dressing. Disposition: Patient tolerated procedure well. Patient to return in 1 week for follow-up.   Return in about 3 weeks (around 10/20/2019) for Wound Care, possible graft .

## 2019-10-04 NOTE — Progress Notes (Signed)
  Subjective:  Patient ID: Rose [name], female    DOB: Aug 28, 1955,  MRN: [id number]  Chief Complaint  Patient presents with  . Routine Post Op    POV#4 DOS 3.26.2021 RT FOOT WOUND DEBRIDEMENT, APPLICATION OF SKIN GRAFT SUBSTITUTE. Pt states she has some plantar forefoot pain.    64 y.o. female presents for wound care. Hx confirmed with patient.  Objective:  Physical Exam: Wound Location: right foot lateral 5th MPJ Wound Measurement: 0.8x1 Wound Base: UTA Peri-wound: Normal Exudate: None: wound tissue dry wound without warmth, erythema, signs of acute infection  Assessment:   1. Diabetic ulcer of other part of right foot associated with diabetes mellitus due to underlying condition, with fat layer exposed ([place])   2. PAD (peripheral artery disease) ([place])   3. Diabetic polyneuropathy associated with diabetes mellitus due to underlying condition [facility])      Plan:  Patient was evaluated and treated and all questions answered.  Ulcer Right Foot -Minimal debridement today. -Wound improving. Would benefit from repeat application -Dressed with hydrogel and band-aid -Patient has failed all conservative therapy and wishes to proceed with surgical intervention. All risks, benefits, and alternatives discussed with patient. No guarantees given. Consent reviewed and signed by patient. -Planned procedures: Right foot repeat debridement, skin graft substitute application.   No follow-ups on file.

## 2019-10-14 ENCOUNTER — Encounter: Payer: Medicare Other | Admitting: Internal Medicine

## 2019-10-17 ENCOUNTER — Encounter: Payer: Self-pay | Admitting: Internal Medicine

## 2019-10-17 ENCOUNTER — Ambulatory Visit (INDEPENDENT_AMBULATORY_CARE_PROVIDER_SITE_OTHER): Payer: Medicare Other | Admitting: Internal Medicine

## 2019-10-17 VITALS — BP 139/99 | HR 74 | Temp 98.4°F | Wt 170.0 lb

## 2019-10-17 DIAGNOSIS — I10 Essential (primary) hypertension: Secondary | ICD-10-CM

## 2019-10-17 DIAGNOSIS — E1142 Type 2 diabetes mellitus with diabetic polyneuropathy: Secondary | ICD-10-CM

## 2019-10-17 DIAGNOSIS — G629 Polyneuropathy, unspecified: Secondary | ICD-10-CM

## 2019-10-17 DIAGNOSIS — M5441 Lumbago with sciatica, right side: Secondary | ICD-10-CM

## 2019-10-17 DIAGNOSIS — G8929 Other chronic pain: Secondary | ICD-10-CM

## 2019-10-17 DIAGNOSIS — I739 Peripheral vascular disease, unspecified: Secondary | ICD-10-CM | POA: Diagnosis not present

## 2019-10-17 DIAGNOSIS — E1169 Type 2 diabetes mellitus with other specified complication: Secondary | ICD-10-CM

## 2019-10-17 DIAGNOSIS — F17200 Nicotine dependence, unspecified, uncomplicated: Secondary | ICD-10-CM

## 2019-10-17 MED ORDER — DULOXETINE HCL 20 MG PO CPEP: 60.0000 mg | ORAL_CAPSULE | Freq: Every day | ORAL | 2 refills | Status: DC

## 2019-10-17 MED ORDER — PREGABALIN 25 MG PO CAPS
25.0000 mg | ORAL_CAPSULE | Freq: Three times a day (TID) | ORAL | 2 refills | Status: DC
Start: 2019-10-17 — End: 2019-10-17

## 2019-10-17 NOTE — Progress Notes (Signed)
CC: Lower extremity pain  HPI:  [name] is a 64 y.o. female with a past medical history stated below and presents today for lower extremity pain. Please see problem based assessment and plan for additional details.  Past Medical History:  Diagnosis Date  . Allergic rhinitis   . Anxiety   . Arthritis   . Chronic back pain   . CKD (chronic kidney disease), stage III    folllowed by pcp  . Depression   . Diabetic neuropathy ([place])   . Environmental allergies   . Fibromyalgia   . Frequency of urination   . History of chronic bronchitis   . History of drug abuse ([place]) states quit herion in 2006  . History of hepatitis C last abd ultrasound in epic 11-10-2018;  last hepative panel in epic 11-30-2018 was normal   previously followed by [facility] (notes in care everywhere)-- dx 04/ 2015,  started harvonic treatment 07/ 2015  . Hypertension    followed by pcp  (nuclear stress test 01-15-2017 in epic,  showed low risk normal w/ nuclear ef 55%)  . Methadone maintenance therapy patient ([place])   . Mild asthma    followed by pcp  . Nocturia   . PAD (peripheral artery disease) ([place])    followed by cardiology, dr Fletcher Anon---  06-01-2019  s/p bilateral CIA angioplasty stenting  . Right foot ulcer ([place])   . Tendonitis    bilateral wrist  . Type 2 diabetes mellitus ([place])    followed by pcp   (06-16-2019  per pt checks blood sugar every other day in am,  fasting sugar 92-95)  . Urge urinary incontinence     Current Outpatient Medications on File Prior to Visit  Medication Sig Dispense Refill  . albuterol (PROVENTIL HFA;VENTOLIN HFA) 108 (90 Base) MCG/ACT inhaler Inhale 2 puffs into the lungs every 6 (six) hours as needed. For wheezing 1 Inhaler 2  . aspirin EC 81 MG tablet Take 1 tablet (81 mg total) by mouth daily.    [name] atenolol (TENORMIN) 50 MG tablet TAKE 1/2 TABLET(25 MG) BY MOUTH DAILY 45 tablet 1  . atorvastatin (LIPITOR) 40 MG tablet Take 1 tablet  (40 mg total) by mouth daily. 90 tablet 3  . buPROPion (WELLBUTRIN) 75 MG tablet Take 75 mg by mouth daily.    . cetirizine (ZYRTEC) 10 MG tablet Take 1 tablet (10 mg total) by mouth daily. 90 tablet 1  . cyclobenzaprine (FLEXERIL) 5 MG tablet Take 5 mg by mouth 3 (three) times daily as needed for muscle spasms.    . DULoxetine (CYMBALTA) 20 MG capsule Take 1 capsule (20 mg total) by mouth daily. 30 capsule 2  . fluticasone (FLONASE) 50 MCG/ACT nasal spray SHAKE LIQUID AND USE 1 SPRAY IN EACH NOSTRIL DAILY (Patient taking differently: daily as needed. SHAKE LIQUID AND USE 1 SPRAY IN EACH NOSTRIL DAILY) 16 g 5  . hydrochlorothiazide (HYDRODIURIL) 25 MG tablet TAKE 1 TABLET(25 MG) BY MOUTH DAILY 90 tablet 1  . lidocaine (XYLOCAINE) 5 % ointment Apply 1 application topically as needed. (Patient taking differently: Apply 1 application topically daily as needed for mild pain. ) 50 g 0  . metFORMIN (GLUCOPHAGE) 500 MG tablet Take 1 tablet (500 mg total) by mouth 2 (two) times daily with a meal. (Patient taking differently: Take 500 mg by mouth 2 (two) times daily with a meal. ) 180 tablet 3  . methadone (DOLOPHINE) 10 MG/5ML solution Take 100 mg by  mouth daily.    . Multiple Vitamins-Minerals (ONE-A-DAY WOMENS 50+ ADVANTAGE) TABS Take 1 tablet by mouth daily.     . nicotine (NICODERM CQ - DOSED IN MG/24 HOURS) 14 mg/24hr patch Medicaid [id number] L Wks 1-6: 14 mg x 1 patch daily. Wear for 24 hours. If you have sleep disturbances, remove at bedtime. 30 patch 2  . SANTYL ointment Apply 1 application topically daily.    . silver sulfADIAZINE (SILVADENE) 1 % cream Apply pea-sized amount to wound daily. 50 g 0  . tiZANidine (ZANAFLEX) 4 MG tablet TAKE 1 TABLET(4 MG) BY MOUTH EVERY 8 HOURS AS NEEDED FOR MUSCLE SPASMS 90 tablet 0   No current facility-administered medications on file prior to visit.    Family History  Problem Relation Age of Onset  . Hypertension Mother   . Diabetes type II Mother   . Heart  attack Mother 33  . CAD Father   . Heart attack Father 36  . Hypertension Brother   . Diabetes type II Brother   . Heart attack Brother 78  . Diabetes type II Sister     Social History   Socioeconomic History  . Marital status: Single    Spouse name: Not on file  . Number of children: Not on file  . Years of education: Not on file  . Highest education level: Not on file  Occupational History  . Not on file  Tobacco Use  . Smoking status: Current Every Day Smoker    Packs/day: 0.40    Years: 20.00    Pack years: 8.00    Types: Cigarettes  . Smokeless tobacco: Never Used  . Tobacco comment: 6-7 cigs/day.  Vaping Use  . Vaping Use: Never used  Substance and Sexual Activity  . Alcohol use: No    Alcohol/week: 0.0 standard drinks  . Drug use: Not Currently    Comment: hx heroin use, IV,  last used 2006 (currently on methadone)  . Sexual activity: Never  Other Topics Concern  . Not on file  Social History Narrative  . Not on file   Social Determinants of Health   Financial Resource Strain:   . Difficulty of Paying Living Expenses:   Food Insecurity:   . Worried About Charity fundraiser in the Last Year:   . Arboriculturist in the Last Year:   Transportation Needs:   . Film/video editor (Medical):   [name] Lack of Transportation (Non-Medical):   Physical Activity:   . Days of Exercise per Week:   . Minutes of Exercise per Session:   Stress:   . Feeling of Stress :   Social Connections:   . Frequency of Communication with Friends and Family:   . Frequency of Social Gatherings with Friends and Family:   . Attends Religious Services:   . Active Member of Clubs or Organizations:   . Attends Archivist Meetings:   [name] Marital Status:   Intimate Partner Violence:   . Fear of Current or Ex-Partner:   . Emotionally Abused:   [name] Physically Abused:   . Sexually Abused:     Review of Systems: ROS negative except for what is noted on the assessment and  plan.  There were no vitals filed for this visit.   Physical Exam: Physical Exam Constitutional:      Appearance: Normal appearance.  HENT:     Head: Normocephalic and atraumatic.  Eyes:     Extraocular Movements: Extraocular movements intact.  Cardiovascular:  Rate and Rhythm: Normal rate.     Pulses: Normal pulses.     Heart sounds: Normal heart sounds.  Pulmonary:     Effort: Pulmonary effort is normal.     Breath sounds: Normal breath sounds.  Musculoskeletal:        General: Normal range of motion.     Cervical back: Normal range of motion.     Lumbar back: Tenderness (lumbar back R>L, worse with movement. no sisngificant TTP.) present. No swelling or edema. Decreased range of motion: pateint walkes with back flexed.     Right lower leg: Edema present.     Left lower leg: Edema present.  Skin:    General: Skin is warm and dry.  Neurological:     General: No focal deficit present.     Mental Status: She is alert.     Sensory: Sensory deficit (bilateral lower extremities (Feet)) present.     Motor: No weakness.     Gait: Gait abnormal (flexed at the hips (Bent over)).      Assessment & Plan:   See Encounters Tab for problem based charting.  Patient discussed with Dr. Lars Mage, D.O. [facility], PGY-2 Pager: [phone], Phone: [phone] Date 10/17/2019 Time 9:23 AM

## 2019-10-17 NOTE — Patient Instructions (Addendum)
Thank you, Rose [name] for allowing [place] to provide your care today. Today we discussed Leg pain and Diabetes.    I have ordered the following labs for you:  Lab Orders  No laboratory test(s) ordered today     I will call if any are abnormal. All of your labs can be accessed through "My Chart".  I have place a referrals to [facility] for low back pain. Please make a follow up with cardiology within the month for claudication symptoms.  I have ordered the following tests: none   I have ordered the following medication/changed the following medications:  1. continue Duloxetine 60 mg daily   Please follow-up in 1 month for A1c check and then again October for Dr. [name]  Should you have any questions or concerns please call the internal medicine clinic at [phone].    [name], D.O. [facility]   My Chart Access: [email or web address]   If you have not already done so, please get your COVID 19 vaccine  To schedule an appointment for a COVID vaccine choice any of the following: Go to [email or web address]   Go to [email or web address]                  Call [phone]                                     Call [phone] and select Option 2

## 2019-10-17 NOTE — Assessment & Plan Note (Addendum)
Patient admits to worsening lower extremity leg pain that she describes as pain and cramping intermixed with bilateral feet numbness and tingling. She believes these symptoms are worse since her stents were placed in march. Symptoms are exacerbated by walking and relieved by resting and sitting down.   She states that she has a follow up appointment in September with Dr. Rogue Jury. Last ABIs were performed in 06/2019 and was within normal limits bilaterally.   Patient symptoms are likely a mix of peripheral neuropathy and lower extremity vascular claudication. On exam she had 2+ LE peripheral pulses bilaterally. 1+ pitting lower extremity edema. Legs were no TTP. No skin erythema. Chronic wound on lateral side of right foot without purulence, otherwise, no signs of skin break down.    Plan: - Counseled the patient to keep appointment with Cardiology in September and to call our office if her symptoms acutely worsen.  - I counseled her regarding smoking cessation.  - Encouraged her to continue taking her atorvastatin 40 mg and ASA 81 mg.

## 2019-10-17 NOTE — Assessment & Plan Note (Addendum)
Patient presents with persistent with lower extremity pain, numbness and tingling. The symptoms are most prominent in her bilateral feet. She is taking duloxetine 60 mg from outside provider and 20 mg from our clinic, making 80 mg total. She has tried Gabapentin and pregabalin in the past and states that she had a bad reaction to these medication. She was not able to clarify what reaction. Currently her Diabetes appears to be well controlled with random glucose in the 110-150's and A1c of 6.4 with a history of Stage 3a CKD.   On exam patient does have decreased sensation throughout her bilateral feet with 2-3+ pulses bilaterally and 5/5 strength. She has a chronic ulcer on the lateral aspect of her right foot that was recently debrided by Dr. March Rummage.   I counseled her on restarting pregabalin today, but she states that she was not able to tolerate this in the past.   Plan: - Continue Duloxetine 60 mg

## 2019-10-18 ENCOUNTER — Encounter: Payer: Self-pay | Admitting: Internal Medicine

## 2019-10-18 NOTE — Assessment & Plan Note (Signed)
Patient will need to be counseled regarding Chantix for smoking cessation.  Patient states that Rose [name] would like to quit but Rose [name] cannot afford the nicotine patches or lozenges.  I assess her willingness to try Chantix and Rose [name] was not different.  We will need to get preliminary renal and hepatic function test done prior to starting Chantix.  I counseled her on the importance of quitting smoking in the setting of her significant peripheral artery disease.  I explained that this is her single biggest risk factor for worsening disease and possible amputation in the future.

## 2019-10-18 NOTE — Assessment & Plan Note (Signed)
Patient presents with chronic low back pain the is worse on the right. She states that the pain cause her to have to lean forward in order to stand and walk. She does admit to sciatica down the right. CT imaging form 08/2018 shows mild grade 1 anterolisthesis with severe facet arthropathy Patient denies any red flag symptoms such as saddle anesthesia or incontinence. The pain is worse with movement and improves with rest. She is currently taking Flexeril 5 mg TID, Methadone 100 mg daily, Duloxetine 80 mg.   I counseled the patient regarding the importance of staying mobile and using over the counter NSAIDS sparingly to improve her pain. I think she would benefit from orthopedic evaluation for steroid injection. She would like benefit from physical therapy.   Plan: - Referral to Orthopedics for spine injection.

## 2019-10-18 NOTE — Progress Notes (Signed)
Internal Medicine Clinic Attending  Case discussed with Dr. Coe  At the time of the visit.  We reviewed the resident's history and exam and pertinent patient test results.  I agree with the assessment, diagnosis, and plan of care documented in the resident's note.  

## 2019-10-18 NOTE — Assessment & Plan Note (Signed)
Patient has a Hisotry of diabetes and CKD stage 3a with proteinuria. I will counsel her on starting Losartan at our follow up visit to slow diabetic nephropathy.   Plan: - Start Losartan at next visit.

## 2019-10-18 NOTE — Assessment & Plan Note (Addendum)
Patient will need repeat A1c, Kidney function, Microalbumin/Cr ratio at next appointment. We will need to document foot exam and eye exam at that visit.

## 2019-10-20 ENCOUNTER — Other Ambulatory Visit: Payer: Self-pay

## 2019-10-20 ENCOUNTER — Ambulatory Visit (INDEPENDENT_AMBULATORY_CARE_PROVIDER_SITE_OTHER): Payer: Medicare Other | Admitting: Podiatry

## 2019-10-20 DIAGNOSIS — L97511 Non-pressure chronic ulcer of other part of right foot limited to breakdown of skin: Secondary | ICD-10-CM | POA: Diagnosis not present

## 2019-10-20 DIAGNOSIS — E08621 Diabetes mellitus due to underlying condition with foot ulcer: Secondary | ICD-10-CM

## 2019-10-20 NOTE — Progress Notes (Signed)
  Subjective:  Patient ID: Rose [name], female    DOB: [birth year],  MRN: [id number]  Chief Complaint  Patient presents with  . Wound Check    R plantar forefoot submet 5. Pt stated, "It's doing well. I'm wearing the boot (surgical shoe). Using band-aids and alternating Silvadene/Santyl. 3/10 pain - it hasn't been bothering me". No fever/chills/N&V/foul odor/pus.    64 y.o. female presents for wound care. Hx confirmed with patient. Objective:  Physical Exam: Wound Location: right lateral 5th Wound Measurement: 0.3x0.3 Wound Base: granular Peri-wound: Normal Exudate: None: wound tissue dry wound without warmth, erythema, signs of acute infection  Assessment:   1. Diabetic ulcer of other part of right foot associated with diabetes mellitus due to underlying condition, limited to breakdown of skin [facility])    Plan:  Patient was evaluated and treated and all questions answered.  Ulcer right foot -Wound improving. -Graft was not covered by insurance -Debrided as below -Dressed with silvadene and mepilex border dressing  Procedure: Selective Debridement of Wound Rationale: Removal of devitalized tissue from the wound to promote healing.  Pre-Debridement Wound Measurements: 0.3 cm x 0.3 cm x 0.1 cm  Post-Debridement Wound Measurements: same as pre-debridement. Type of Debridement: sharp selective Tissue Removed: Devitalized soft-tissue Dressing: Dry, sterile, compression dressing. Disposition: Patient tolerated procedure well. Patient to return in 1 week for follow-up.   No follow-ups on file.

## 2019-10-23 ENCOUNTER — Other Ambulatory Visit: Payer: Self-pay | Admitting: Internal Medicine

## 2019-10-23 DIAGNOSIS — G8929 Other chronic pain: Secondary | ICD-10-CM

## 2019-10-23 DIAGNOSIS — M545 Low back pain, unspecified: Secondary | ICD-10-CM

## 2019-10-26 ENCOUNTER — Ambulatory Visit: Payer: Self-pay

## 2019-10-26 ENCOUNTER — Other Ambulatory Visit: Payer: Self-pay

## 2019-10-26 ENCOUNTER — Encounter: Payer: Self-pay | Admitting: Family Medicine

## 2019-10-26 ENCOUNTER — Ambulatory Visit (INDEPENDENT_AMBULATORY_CARE_PROVIDER_SITE_OTHER): Payer: Medicare Other | Admitting: Family Medicine

## 2019-10-26 DIAGNOSIS — G8929 Other chronic pain: Secondary | ICD-10-CM

## 2019-10-26 DIAGNOSIS — M545 Low back pain, unspecified: Secondary | ICD-10-CM

## 2019-10-26 NOTE — Progress Notes (Signed)
I saw and examined the patient with Dr. Elouise Munroe and agree with assessment and plan as outlined.    Chronic right-sided LBP with occasional pain into right leg.  Prior CT myelogram showed some ankylosis at L5-S1.  Today's x-rays show some facet DJD.    She has tried injections, chiro, and PT in the past.  Injections eventually "stopped working".    Elected to refer her to Dr. Ernestina Patches for consideration of lumbar facet RF.

## 2019-10-26 NOTE — Progress Notes (Signed)
Office Visit Note   Patient: Rose [name]           Date of Birth: Nov 04, 1955           MRN: [id number] Visit Date: 10/26/2019 Requested by: Aldine Contes, MD [address] [place],  [place] [id number] PCP: [name], MD  Subjective: Chief Complaint  Patient presents with  . Lower Back - Pain    Pain right side of back usually, but occ moves to the center, since she fell on her right side 8 months ago. She has known neuropathy, so she is not sure if the pain is radiating down the legs. She said she falls often. Walks with cane.    HPI: 64yo F presenting to clinic with chronic right sided low back pain. Patient states this pain has been present for 8 months, since slipping on a throw rug in her house, when she fell backwards and landed on her back. She says that it localizes primarily to her back and 'just above my right hip,' however this is complicated by a history of diabetic neuropathy, causing bilateral foot numbness. She states that she is very frustrated because she has had multiple Xrays and CT scans, however 'They've never done anything for me.' Upon further review, however, patient has seen chiropractic care, physical therapy, and has done spinal injections in the past (3-4 yrs ago). She states that the steroidal injections 'Just stopped working.'  Pain is worsened with activity, esp with house cleaning or other activities when she is required to stand throughout the day.                ROS:   All other systems were reviewed and are negative.  Objective: Vital Signs: LMP 07/31/2011   Physical Exam:  General:  Alert and oriented, in no acute distress. Pulm:  Breathing unlabored. Psy:  Normal mood, congruent affect. Skin:  2+ pitting edema bilateral LE.   BACK:  Stands in significant forward flexion at hips, with no appreciable lumbar lordosis and reduced thoracic kyphosis. Increased cervical lordosis.  Extension significantly limited 2/2 pain.    Palpation: Tenderness to palpation over lumbar spine just right of midline, as well as over right paraspinal muscles. Tenderness over R SI Joint, and within R gluteal musculature.  Seated SLR negative.  Reduced sensation in bilateral feet. 5/5 strength bilateral hip flexion, knee extension, flexion, foot plantar and dorsiflexion.    Imaging: Lumbar AP/Lat obtained today. Demonstrates facet hypertrophy, spurring throughout lumbar spine, with loss of disc height. View significantly limited by poor posturing on examination.   Assessment & Plan: 64yo F presenting to clinic for chronic low back pain, which has ben ongoing for several years despite chiropractic care, physical therapy, APAP, NSAIDs, steroid injection therapy. Patient with examination as above, which does demonstrate tenderness with palpation over facet joint and significant pain with extension. Will place referral to PM&R for consideration of possible RFA on affected facets to see if this would improve her symptoms.      Procedures: No procedures performed  No notes on file     PMFS History: Patient Active Problem List   Diagnosis Date Noted  . PAD (peripheral artery disease) ([place]) 08/18/2019  . Diabetic foot ulcer associated with type 2 diabetes mellitus ([place]) 06/21/2019  . Encounter for completion of form with patient 06/21/2019  . Laceration of right foot 03/04/2019  . Edema of lower extremity 10/28/2018  . Allergic rhinitis 10/28/2018  . Calculus of gallbladder  without cholecystitis without obstruction 10/28/2018  . History of rib fracture 10/28/2018  . Lipoma of neck 07/16/2017  . Diabetic neuropathy ([place]) 06/02/2017  . HPV in female 06/11/2016  . Recurrent falls 04/30/2016  . ACE inhibitor-aggravated angioedema, initial encounter 04/26/2016  . Environmental allergies 12/28/2015  . Hyperlipidemia associated with type 2 diabetes mellitus ([place]) 12/13/2015  . Tobacco use disorder 12/13/2015  . Chronic renal  insufficiency 12/12/2015  . Depression 12/12/2015  . Methadone maintenance therapy patient ([place])   . History of drug abuse ([place])   . Chronic back pain   . Preventative health care 12/14/2014  . Type 2 diabetes mellitus ([place]) 02/27/2012  . Essential hypertension 02/27/2012   Past Medical History:  Diagnosis Date  . Allergic rhinitis   . Anxiety   . Arthritis   . Chronic back pain   . CKD (chronic kidney disease), stage III    folllowed by pcp  . Depression   . Diabetic neuropathy ([place])   . Environmental allergies   . Fibromyalgia   . Frequency of urination   . History of chronic bronchitis   . History of drug abuse ([place]) states quit herion in 2006  . History of hepatitis C last abd ultrasound in epic 11-10-2018;  last hepative panel in epic 11-30-2018 was normal   previously followed by [facility] (notes in care everywhere)-- dx 04/ 2015,  started harvonic treatment 07/ 2015  . Hypertension    followed by pcp  (nuclear stress test 01-15-2017 in epic,  showed low risk normal w/ nuclear ef 55%)  . Methadone maintenance therapy patient ([place])   . Mild asthma    followed by pcp  . Nocturia   . PAD (peripheral artery disease) ([place])    followed by cardiology, dr Fletcher Anon---  06-01-2019  s/p bilateral CIA angioplasty stenting  . Right foot ulcer ([place])   . Tendonitis    bilateral wrist  . Type 2 diabetes mellitus ([place])    followed by pcp   (06-16-2019  per pt checks blood sugar every other day in am,  fasting sugar 92-95)  . Urge urinary incontinence     Family History  Problem Relation Age of Onset  . Hypertension Mother   . Diabetes type II Mother   . Heart attack Mother 18  . CAD Father   . Heart attack Father 46  . Hypertension Brother   . Diabetes type II Brother   . Heart attack Brother 38  . Diabetes type II Sister     Past Surgical History:  Procedure Laterality Date  . ABDOMINAL AORTOGRAM W/LOWER EXTREMITY Bilateral 06/01/2019   Procedure:  ABDOMINAL AORTOGRAM W/LOWER EXTREMITY;  Surgeon: Wellington Hampshire, MD;  Location: [facility];  Service: Cardiovascular;  Laterality: Bilateral;  . APPENDECTOMY  1972  . INTERSTIM IMPLANT PLACEMENT  10/23/2011   Procedure: Barrie Lyme IMPLANT FIRST STAGE;  Surgeon: Reece Packer, MD;  [facility];  Service: Urology;  Laterality: N/A;  . INTERSTIM IMPLANT PLACEMENT  10/23/2011   Procedure: Barrie Lyme IMPLANT SECOND STAGE;  Surgeon: Reece Packer, MD;  Location[facility];  Service: Urology;  Laterality: N/A;  . PANACOS TISSUE GRAFT Right 06/24/2019   Procedure: Application of Skin Graft Substitute;  Surgeon: Evelina Bucy, DPM;  Location: [facility];  Service: Podiatry;  Laterality: Right;  . PERIPHERAL VASCULAR INTERVENTION Bilateral 06/01/2019   Procedure: PERIPHERAL VASCULAR INTERVENTION;  Surgeon: Wellington Hampshire, MD;  Location: [facility]  INVASIVE CV [facility];  Service: Cardiovascular;  Laterality: Bilateral;  . WOUND DEBRIDEMENT Right 06/24/2019   Procedure: DEBRIDEMENT WOUND;  Surgeon: Evelina Bucy, DPM;  Location[facility];  Service: Podiatry;  Laterality: Right;  . WOUND DEBRIDEMENT Right 08/24/2019   Procedure: DEBRIDEMENT RIGHT FOOT WOUND; APPLICATION OF SKIN GRAFT SUBSTITUTE;  Surgeon: Evelina Bucy, DPM;  Location: [place];  Service: Podiatry;  Laterality: Right;   Social History   Occupational History  . Not on file  Tobacco Use  . Smoking status: Current Every Day Smoker    Packs/day: 0.50    Years: 20.00    Pack years: 10.00    Types: Cigarettes  . Smokeless tobacco: Never Used  Vaping Use  . Vaping Use: Never used  Substance and Sexual Activity  . Alcohol use: No    Alcohol/week: 0.0 standard drinks  . Drug use: Not Currently    Comment: hx heroin use, IV,  last used 2006 (currently on methadone)  . Sexual activity: Never

## 2019-11-17 ENCOUNTER — Encounter: Payer: Self-pay | Admitting: Physical Medicine and Rehabilitation

## 2019-11-17 ENCOUNTER — Ambulatory Visit (INDEPENDENT_AMBULATORY_CARE_PROVIDER_SITE_OTHER): Payer: Medicare Other | Admitting: Physical Medicine and Rehabilitation

## 2019-11-17 ENCOUNTER — Other Ambulatory Visit: Payer: Self-pay

## 2019-11-17 ENCOUNTER — Ambulatory Visit: Payer: Self-pay

## 2019-11-17 VITALS — BP 153/75 | HR 78

## 2019-11-17 DIAGNOSIS — M47816 Spondylosis without myelopathy or radiculopathy, lumbar region: Secondary | ICD-10-CM | POA: Diagnosis not present

## 2019-11-17 MED ORDER — METHYLPREDNISOLONE ACETATE 80 MG/ML IJ SUSP
80.0000 mg | Freq: Once | INTRAMUSCULAR | Status: AC
  Administered 2019-11-17: 80 mg

## 2019-11-17 MED ORDER — BUPIVACAINE HCL 0.5 % IJ SOLN
3.0000 mL | Freq: Once | INTRAMUSCULAR | Status: AC
  Administered 2019-11-17: 3 mL

## 2019-11-17 NOTE — Progress Notes (Signed)
Pt states right lower and middle back pain. Pt states walking and standing makes the pain worse. Pt states pain meds helps with sum of the pain.  Numeric Pain Rating Scale and Functional Assessment Average Pain 5   In the last MONTH (on 0-10 scale) has pain interfered with the following?  1. General activity like being  able to carry out your everyday physical activities such as walking, climbing stairs, carrying groceries, or moving a chair?  Rating(9)   +Driver, -BT, -Dye Allergies.

## 2019-11-17 NOTE — Progress Notes (Signed)
Rose [name] - 64 y.o. female MRN [id number]  Date of birth: 1955-09-09  Office Visit Note: Visit Date: 11/17/2019 PCP: [name], MD Referred by: [name], MD  Subjective: Chief Complaint  Patient presents with  . Lower Back - Pain  . Middle Back - Pain   HPI:  Rose [name] is a 64 y.o. female who comes in today at the request of Dr. Legrand Como Hilts for planned Right L3-L4, L4-L5, and L5-S1 Lumbar facet/medial branch block with fluoroscopic guidance.  The patient has failed conservative care including home exercise, medications, time and activity modification.  This injection will be diagnostic and hopefully therapeutic.  Please see requesting physician notes for further details and justification.  Exam shows concordant low back pain with facet joint loading and extension.   ROS Otherwise per HPI.  Assessment & Plan: Visit Diagnoses:  1. Spondylosis without myelopathy or radiculopathy, lumbar region     Plan: No additional findings.   Meds & Orders:  Meds ordered this encounter  Medications  . bupivacaine (MARCAINE) 0.5 % (with pres) injection 3 mL  . methylPREDNISolone acetate (DEPO-MEDROL) injection 80 mg    Orders Placed This Encounter  Procedures  . Facet Injection  . XR C-ARM NO REPORT    Follow-up: Return for Review Pain Diary.   Procedures: No procedures performed  Lumbar Diagnostic Facet Joint Nerve Block with Fluoroscopic Guidance   Patient: Rose [name]      Date of Birth: [birth year] MRN: [id number] PCP: [name], MD      Visit Date: 11/17/2019   Universal Protocol:    Date/Time: 11/17/2110:06 PM  Consent Given By: the patient  Position: PRONE  Additional Comments: Vital signs were monitored before and after the procedure. Patient was prepped and draped in the usual sterile fashion. The correct patient, procedure, and site was verified.   Injection Procedure Details:  Procedure Site One Meds  Administered:  Meds ordered this encounter  Medications  . bupivacaine (MARCAINE) 0.5 % (with pres) injection 3 mL  . methylPREDNISolone acetate (DEPO-MEDROL) injection 80 mg     Laterality: Right  Location/Site:  L3-L4 L4-L5 L5-S1  Needle size: 22 ga.  Needle type:spinal  Needle Placement: Oblique pedical  Findings:   -Comments: There was excellent flow of contrast along the articular pillars without intravascular flow.  Procedure Details: The fluoroscope beam is vertically oriented in AP and then obliqued 15 to 20 degrees to the ipsilateral side of the desired nerve to achieve the "Scotty dog" appearance.  The skin over the target area of the junction of the superior articulating process and the transverse process (sacral ala if blocking the L5 dorsal rami) was locally anesthetized with a 1 ml volume of 1% Lidocaine without Epinephrine.  The spinal needle was inserted and advanced in a trajectory view down to the target.   After contact with periosteum and negative aspirate for blood and CSF, correct placement without intravascular or epidural spread was confirmed by injecting 0.5 ml. of Isovue-250.  A spot radiograph was obtained of this image.    Next, a 0.5 ml. volume of the injectate described above was injected. The needle was then redirected to the other facet joint nerves mentioned above if needed.  Prior to the procedure, the patient was given a Pain Diary which was completed for baseline measurements.  After the procedure, the patient rated their pain every 30 minutes and will continue rating at this frequency for a total of 5 hours.  The patient  has been asked to complete the Diary and return to [place] by mail, fax or hand delivered as soon as possible.   Additional Comments:  The patient tolerated the procedure well Dressing: 2 x 2 sterile gauze and Band-Aid    Post-procedure details: Patient was observed during the procedure. Post-procedure instructions were  reviewed.  Patient left the clinic in stable condition.    Clinical History: 64 year old female with right side low back pain radiating down the posterolateral thigh, stops at the knee.  EXAM: LUMBAR MYELOGRAM  FLUOROSCOPY TIME:  1 minutes 24 seconds  PROCEDURE: After thorough discussion of risks and benefits of the procedure including bleeding, infection, injury to nerves, blood vessels, adjacent structures as well as headache, written and oral informed consent was obtained. Consent was obtained by Dr. Genevie Ann. Time out form was completed. A "time-out" was performed.  Patient was positioned prone on the fluoroscopy table. Local anesthesia was provided with 1% lidocaine without epinephrine after prepped and draped in the usual sterile fashion. Puncture was performed at L2-L3 using a 3 1/2 inch 22-gauge spinal needle via left sub laminar approach. Using a single pass through the dura, the needle was placed within the thecal sac, with return of clear CSF. 13 milliliters of Isovue M-200 was injected into the thecal sac, with normal opacification of the nerve roots and cauda equina consistent with free flow within the subarachnoid space.  I personally performed the lumbar puncture and administered the intrathecal contrast. I also personally supervised acquisition of the myelogram images.  TECHNIQUE: Contiguous axial images were obtained through the Lumbar spine after the intrathecal infusion of infusion. Coronal and sagittal reconstructions were obtained of the axial image sets.  COMPARISON:  Lumbar MRI 12/08/2008. Thoracolumbar radiographs 10/09/2005.  FINDINGS  CT LUMBAR MYELOGRAM FINDINGS:  Negative visible costophrenic angle. Partially visible evidence of extensive cholelithiasis in a distended gallbladder (series 5, image 40), and layering in the more normal appearing gallbladder neck (series 4, image 33). Otherwise negative visible noncontrast abdominal  viscera. Aortoiliac calcified atherosclerosis.  Partially visible right side sacral spinal stimulator device.  Stable vertebral height and alignment since 2010 with no L4-L5 spondylolisthesis evident on these images, but there is vacuum facet phenomena at that level, see additional details below. No acute osseous abnormality identified. Intact visible sacrum and SI joints.  Normal myelographic appearance of the lower thoracic spinal cord with conus at L1-L2. No lower thoracic spinal stenosis. Cauda equina nerve roots appear normal.  T12-L1:  Mild facet hypertrophy.  No stenosis.  L1-L2:  Negative.  L2-L3: Mild circumferential disc bulge. Mild facet and ligament flavum hypertrophy. Mild left L2 neural foraminal stenosis.  L3-L4: Mild circumferential disc bulge. Mild to moderate facet and mild ligament flavum hypertrophy. No spinal or lateral recess stenosis. Mild L3 neural foraminal stenosis which appears greater on the left.  L4-L5: Mild circumferential disc bulge. Moderate to severe facet hypertrophy with vacuum facet, greater on the left. Only mild ligament flavum hypertrophy. No spinal or lateral recess stenosis. Mild bilateral L4 foraminal stenosis appears fairly symmetric.  L5-S1:  Chronically ankylosed.  No stenosis.  IMPRESSION: 1. No lumbar spinal stenosis or convincing right side neural impingement, but there is evidence of mild grade 1 anterolisthesis of L4 on L5 when upright, and moderate to severe facet arthropathy at that level. So perhaps L4-L5 is the symptomatic level. And there is underlying chronic L5-S1 ankylosis. Mild lumbar disc bulging. Up to mild bilateral L2 through L4 neural foraminal stenosis. 2. Cholelithiasis which may be  severe and/or associated with porcelain gallbladder. Consider right upper quadrant ultrasound or CT abdomen. 3. Partially visible sacral stimulator device. 4.  Aortic Atherosclerosis  (ICD10-I70.0).   Electronically Signed   By: Genevie Ann M.D.   On: 08/30/2018 11:11     Objective:  VS:  HT:    WT:   BMI:     BP:(!) 153/75  HR:78bpm  TEMP: ( )  RESP:  Physical Exam Constitutional:      General: She is not in acute distress.    Appearance: Normal appearance. She is not ill-appearing.  HENT:     Head: Normocephalic and atraumatic.     Right Ear: External ear normal.     Left Ear: External ear normal.  Eyes:     Extraocular Movements: Extraocular movements intact.  Cardiovascular:     Rate and Rhythm: Normal rate.     Pulses: Normal pulses.  Musculoskeletal:     Right lower leg: No edema.     Left lower leg: No edema.     Comments: Patient has good distal strength with no pain over the greater trochanters.  No clonus or focal weakness.Patient somewhat slow to rise from a seated position to full extension.  There is concordant low back pain with facet loading and lumbar spine extension rotation.  There are no definitive trigger points but the patient is somewhat tender across the lower back and PSIS.  There is no pain with hip rotation.  Skin:    Findings: No erythema, lesion or rash.  Neurological:     General: No focal deficit present.     Mental Status: She is alert and oriented to person, place, and time.     Sensory: No sensory deficit.     Motor: No weakness or abnormal muscle tone.     Coordination: Coordination normal.  Psychiatric:        Mood and Affect: Mood normal.        Behavior: Behavior normal.      Imaging: XR C-ARM NO REPORT  Result Date: 11/17/2019 Please see Notes tab for imaging impression.

## 2019-11-17 NOTE — Procedures (Signed)
Lumbar Diagnostic Facet Joint Nerve Block with Fluoroscopic Guidance   Patient: Rose [name]      Date of Birth: [birth year] MRN: [id number] PCP: [name], MD      Visit Date: 11/17/2019   Universal Protocol:    Date/Time: 11/17/2110:06 PM  Consent Given By: the patient  Position: PRONE  Additional Comments: Vital signs were monitored before and after the procedure. Patient was prepped and draped in the usual sterile fashion. The correct patient, procedure, and site was verified.   Injection Procedure Details:  Procedure Site One Meds Administered:  Meds ordered this encounter  Medications  . bupivacaine (MARCAINE) 0.5 % (with pres) injection 3 mL  . methylPREDNISolone acetate (DEPO-MEDROL) injection 80 mg     Laterality: Right  Location/Site:  L3-L4 L4-L5 L5-S1  Needle size: 22 ga.  Needle type:spinal  Needle Placement: Oblique pedical  Findings:   -Comments: There was excellent flow of contrast along the articular pillars without intravascular flow.  Procedure Details: The fluoroscope beam is vertically oriented in AP and then obliqued 15 to 20 degrees to the ipsilateral side of the desired nerve to achieve the "Scotty dog" appearance.  The skin over the target area of the junction of the superior articulating process and the transverse process (sacral ala if blocking the L5 dorsal rami) was locally anesthetized with a 1 ml volume of 1% Lidocaine without Epinephrine.  The spinal needle was inserted and advanced in a trajectory view down to the target.   After contact with periosteum and negative aspirate for blood and CSF, correct placement without intravascular or epidural spread was confirmed by injecting 0.5 ml. of Isovue-250.  A spot radiograph was obtained of this image.    Next, a 0.5 ml. volume of the injectate described above was injected. The needle was then redirected to the other facet joint nerves mentioned above if needed.  Prior to  the procedure, the patient was given a Pain Diary which was completed for baseline measurements.  After the procedure, the patient rated their pain every 30 minutes and will continue rating at this frequency for a total of 5 hours.  The patient has been asked to complete the Diary and return to [place] by mail, fax or hand delivered as soon as possible.   Additional Comments:  The patient tolerated the procedure well Dressing: 2 x 2 sterile gauze and Band-Aid    Post-procedure details: Patient was observed during the procedure. Post-procedure instructions were reviewed.  Patient left the clinic in stable condition.

## 2019-11-22 ENCOUNTER — Other Ambulatory Visit: Payer: Self-pay

## 2019-11-22 ENCOUNTER — Ambulatory Visit (INDEPENDENT_AMBULATORY_CARE_PROVIDER_SITE_OTHER): Payer: Medicare Other | Admitting: Podiatry

## 2019-11-22 ENCOUNTER — Other Ambulatory Visit: Payer: Self-pay | Admitting: Internal Medicine

## 2019-11-22 DIAGNOSIS — E08621 Diabetes mellitus due to underlying condition with foot ulcer: Secondary | ICD-10-CM | POA: Diagnosis not present

## 2019-11-22 DIAGNOSIS — E785 Hyperlipidemia, unspecified: Secondary | ICD-10-CM

## 2019-11-22 DIAGNOSIS — E1169 Type 2 diabetes mellitus with other specified complication: Secondary | ICD-10-CM

## 2019-11-22 DIAGNOSIS — L97511 Non-pressure chronic ulcer of other part of right foot limited to breakdown of skin: Secondary | ICD-10-CM

## 2019-11-22 NOTE — Progress Notes (Signed)
  Subjective:  Patient ID: Rose [name], female    DOB: [birth year],  MRN: [id number]  Chief Complaint  Patient presents with  . Wound Check    Pt states no concerns and denies fever/chills/nausea/vomiting.  . Toe Pain    Right 1st digit painful 2-3 weeks no known injuries.    64 y.o. female presents for wound care. Hx confirmed with patient. Objective:  Physical Exam: Wound Location: right lateral 5th Wound Measurement: 0.2x0.2 Wound Base: granular Peri-wound: Normal Exudate: None: wound tissue dry wound without warmth, erythema, signs of acute infection  Assessment:   1. Diabetic ulcer of other part of right foot associated with diabetes mellitus due to underlying condition, limited to breakdown of skin [facility])    Plan:  Patient was evaluated and treated and all questions answered.  Ulcer right foot -Wound improving. -Debrided as below -Dressed with medihoney and gauze  Procedure: Selective Debridement of Wound Rationale: Removal of devitalized tissue from the wound to promote healing.  Pre-Debridement Wound Measurements: 0.2 cm x 0.20 cm x 0.1 cm  Post-Debridement Wound Measurements: same as pre-debridement. Type of Debridement: sharp selective Tissue Removed: Devitalized soft-tissue Dressing: Dry, sterile, compression dressing. Disposition: Patient tolerated procedure well. Patient to return in 1 week for follow-up.   Hx of Gout Bilat -Hold off injection today  No follow-ups on file.

## 2019-12-07 ENCOUNTER — Ambulatory Visit (INDEPENDENT_AMBULATORY_CARE_PROVIDER_SITE_OTHER): Payer: Medicare Other | Admitting: Physical Medicine and Rehabilitation

## 2019-12-07 ENCOUNTER — Other Ambulatory Visit: Payer: Self-pay

## 2019-12-07 ENCOUNTER — Encounter: Payer: Self-pay | Admitting: Physical Medicine and Rehabilitation

## 2019-12-07 ENCOUNTER — Ambulatory Visit: Payer: Self-pay

## 2019-12-07 VITALS — BP 140/69 | HR 78

## 2019-12-07 DIAGNOSIS — M47816 Spondylosis without myelopathy or radiculopathy, lumbar region: Secondary | ICD-10-CM

## 2019-12-07 MED ORDER — METHYLPREDNISOLONE ACETATE 80 MG/ML IJ SUSP: 40.0000 mg | Freq: Once | INTRAMUSCULAR | Status: DC

## 2019-12-07 NOTE — Progress Notes (Signed)
Pt states lower back pain. Pt state walking and sitting makes the pain worse. Pt state pain pills helps a little during the day. Pt has hx of inj on 11/17/19 pt state the last inj helped for four days then pain return.  Numeric Pain Rating Scale and Functional Assessment Average Pain 4   In the last MONTH (on 0-10 scale) has pain interfered with the following?  1. General activity like being  able to carry out your everyday physical activities such as walking, climbing stairs, carrying groceries, or moving a chair?  Rating(3)   +Driver, -BT, -Dye Allergies.

## 2019-12-19 NOTE — Procedures (Signed)
Lumbar Diagnostic Facet Joint Nerve Block with Fluoroscopic Guidance   Patient: Rose [name]      Date of Birth: Oct 03, 1955 MRN: [id number] PCP: [name], MD      Visit Date: 12/07/2019   Universal Protocol:    Date/Time: 09/20/215:55 AM  Consent Given By: the patient  Position: PRONE  Additional Comments: Vital signs were monitored before and after the procedure. Patient was prepped and draped in the usual sterile fashion. The correct patient, procedure, and site was verified.   Injection Procedure Details:  Procedure Site One Meds Administered:  Meds ordered this encounter  Medications  . methylPREDNISolone acetate (DEPO-MEDROL) injection 40 mg     Laterality: Right  Location/Site:  L3-L4 L4-L5 L5-S1  Needle size: 22 ga.  Needle type:spinal  Needle Placement: Oblique pedical  Findings:   -Comments: There was excellent flow of contrast along the articular pillars without intravascular flow.  Procedure Details: The fluoroscope beam is vertically oriented in AP and then obliqued 15 to 20 degrees to the ipsilateral side of the desired nerve to achieve the "Scotty dog" appearance.  The skin over the target area of the junction of the superior articulating process and the transverse process (sacral ala if blocking the L5 dorsal rami) was locally anesthetized with a 1 ml volume of 1% Lidocaine without Epinephrine.  The spinal needle was inserted and advanced in a trajectory view down to the target.   After contact with periosteum and negative aspirate for blood and CSF, correct placement without intravascular or epidural spread was confirmed by injecting 0.5 ml. of Isovue-250.  A spot radiograph was obtained of this image.    Next, a 0.5 ml. volume of the injectate described above was injected. The needle was then redirected to the other facet joint nerves mentioned above if needed.  Prior to the procedure, the patient was given a Pain Diary which was  completed for baseline measurements.  After the procedure, the patient rated their pain every 30 minutes and will continue rating at this frequency for a total of 5 hours.  The patient has been asked to complete the Diary and return to [place] by mail, fax or hand delivered as soon as possible.   Additional Comments:  The patient tolerated the procedure well Dressing: 2 x 2 sterile gauze and Band-Aid    Post-procedure details: Patient was observed during the procedure. Post-procedure instructions were reviewed.  Patient left the clinic in stable condition.

## 2019-12-19 NOTE — Progress Notes (Signed)
Dallie Trysten Berti - 64 y.o. female MRN [id number]  Date of birth: [birth year]  Office Visit Note: Visit Date: 12/07/2019 PCP: [name], MD Referred by: [name], MD  Subjective: Chief Complaint  Patient presents with  . Lower Back - Pain   HPI:  Judee Tinika Bucknam is a 64 y.o. female who comes in today for planned repeat Right L3-L4 L4-L5 L5-S1 Lumbar facet/medial branch block with fluoroscopic guidance.  The patient has failed conservative care including home exercise, medications, time and activity modification.  This injection will be diagnostic and hopefully therapeutic.  Please see requesting physician notes for further details and justification.  Exam shows concordant low back pain with facet joint loading and extension. Patient received more than 80% pain relief from prior injection. This would be the second injection and a double block paradigm regimen.     Referring:Dr. Legrand Como Hilts   ROS Otherwise per HPI.  Assessment & Plan: Visit Diagnoses:  1. Spondylosis without myelopathy or radiculopathy, lumbar region     Plan: No additional findings.   Meds & Orders:  Meds ordered this encounter  Medications  . methylPREDNISolone acetate (DEPO-MEDROL) injection 40 mg    Orders Placed This Encounter  Procedures  . Facet Injection  . XR C-ARM NO REPORT    Follow-up: No follow-ups on file.   Procedures: No procedures performed  Lumbar Diagnostic Facet Joint Nerve Block with Fluoroscopic Guidance   Patient: Tiani Arlett Goold      Date of Birth: 1955/08/18 MRN: [id number] PCP: [name], MD      Visit Date: 12/07/2019   Universal Protocol:    Date/Time: 09/20/215:55 AM  Consent Given By: the patient  Position: PRONE  Additional Comments: Vital signs were monitored before and after the procedure. Patient was prepped and draped in the usual sterile fashion. The correct patient, procedure, and site was verified.   Injection  Procedure Details:  Procedure Site One Meds Administered:  Meds ordered this encounter  Medications  . methylPREDNISolone acetate (DEPO-MEDROL) injection 40 mg     Laterality: Right  Location/Site:  L3-L4 L4-L5 L5-S1  Needle size: 22 ga.  Needle type:spinal  Needle Placement: Oblique pedical  Findings:   -Comments: There was excellent flow of contrast along the articular pillars without intravascular flow.  Procedure Details: The fluoroscope beam is vertically oriented in AP and then obliqued 15 to 20 degrees to the ipsilateral side of the desired nerve to achieve the "Scotty dog" appearance.  The skin over the target area of the junction of the superior articulating process and the transverse process (sacral ala if blocking the L5 dorsal rami) was locally anesthetized with a 1 ml volume of 1% Lidocaine without Epinephrine.  The spinal needle was inserted and advanced in a trajectory view down to the target.   After contact with periosteum and negative aspirate for blood and CSF, correct placement without intravascular or epidural spread was confirmed by injecting 0.5 ml. of Isovue-250.  A spot radiograph was obtained of this image.    Next, a 0.5 ml. volume of the injectate described above was injected. The needle was then redirected to the other facet joint nerves mentioned above if needed.  Prior to the procedure, the patient was given a Pain Diary which was completed for baseline measurements.  After the procedure, the patient rated their pain every 30 minutes and will continue rating at this frequency for a total of 5 hours.  The patient has been asked to complete the Diary  and return to [place] by mail, fax or hand delivered as soon as possible.   Additional Comments:  The patient tolerated the procedure well Dressing: 2 x 2 sterile gauze and Band-Aid    Post-procedure details: Patient was observed during the procedure. Post-procedure instructions were reviewed.  Patient  left the clinic in stable condition.    Clinical History: 64 year old female with right side low back pain radiating down the posterolateral thigh, stops at the knee.  EXAM: LUMBAR MYELOGRAM  FLUOROSCOPY TIME:  1 minutes 24 seconds  PROCEDURE: After thorough discussion of risks and benefits of the procedure including bleeding, infection, injury to nerves, blood vessels, adjacent structures as well as headache, written and oral informed consent was obtained. Consent was obtained by Dr. Genevie Ann. Time out form was completed. A "time-out" was performed.  Patient was positioned prone on the fluoroscopy table. Local anesthesia was provided with 1% lidocaine without epinephrine after prepped and draped in the usual sterile fashion. Puncture was performed at L2-L3 using a 3 1/2 inch 22-gauge spinal needle via left sub laminar approach. Using a single pass through the dura, the needle was placed within the thecal sac, with return of clear CSF. 13 milliliters of Isovue M-200 was injected into the thecal sac, with normal opacification of the nerve roots and cauda equina consistent with free flow within the subarachnoid space.  I personally performed the lumbar puncture and administered the intrathecal contrast. I also personally supervised acquisition of the myelogram images.  TECHNIQUE: Contiguous axial images were obtained through the Lumbar spine after the intrathecal infusion of infusion. Coronal and sagittal reconstructions were obtained of the axial image sets.  COMPARISON:  Lumbar MRI 12/08/2008. Thoracolumbar radiographs 10/09/2005.  FINDINGS  CT LUMBAR MYELOGRAM FINDINGS:  Negative visible costophrenic angle. Partially visible evidence of extensive cholelithiasis in a distended gallbladder (series 5, image 40), and layering in the more normal appearing gallbladder neck (series 4, image 33). Otherwise negative visible noncontrast abdominal viscera. Aortoiliac  calcified atherosclerosis.  Partially visible right side sacral spinal stimulator device.  Stable vertebral height and alignment since 2010 with no L4-L5 spondylolisthesis evident on these images, but there is vacuum facet phenomena at that level, see additional details below. No acute osseous abnormality identified. Intact visible sacrum and SI joints.  Normal myelographic appearance of the lower thoracic spinal cord with conus at L1-L2. No lower thoracic spinal stenosis. Cauda equina nerve roots appear normal.  T12-L1:  Mild facet hypertrophy.  No stenosis.  L1-L2:  Negative.  L2-L3: Mild circumferential disc bulge. Mild facet and ligament flavum hypertrophy. Mild left L2 neural foraminal stenosis.  L3-L4: Mild circumferential disc bulge. Mild to moderate facet and mild ligament flavum hypertrophy. No spinal or lateral recess stenosis. Mild L3 neural foraminal stenosis which appears greater on the left.  L4-L5: Mild circumferential disc bulge. Moderate to severe facet hypertrophy with vacuum facet, greater on the left. Only mild ligament flavum hypertrophy. No spinal or lateral recess stenosis. Mild bilateral L4 foraminal stenosis appears fairly symmetric.  L5-S1:  Chronically ankylosed.  No stenosis.  IMPRESSION: 1. No lumbar spinal stenosis or convincing right side neural impingement, but there is evidence of mild grade 1 anterolisthesis of L4 on L5 when upright, and moderate to severe facet arthropathy at that level. So perhaps L4-L5 is the symptomatic level. And there is underlying chronic L5-S1 ankylosis. Mild lumbar disc bulging. Up to mild bilateral L2 through L4 neural foraminal stenosis. 2. Cholelithiasis which may be severe and/or associated with porcelain gallbladder. Consider  right upper quadrant ultrasound or CT abdomen. 3. Partially visible sacral stimulator device. 4.  Aortic Atherosclerosis (ICD10-I70.0).   Electronically Signed   By: Genevie Ann M.D.   On: 08/30/2018 11:11     Objective:  VS:  HT:    WT:   BMI:     BP:140/69  HR:78bpm  TEMP: ( )  RESP:  Physical Exam Constitutional:      General: She is not in acute distress.    Appearance: Normal appearance. She is not ill-appearing.  HENT:     Head: Normocephalic and atraumatic.     Right Ear: External ear normal.     Left Ear: External ear normal.  Eyes:     Extraocular Movements: Extraocular movements intact.  Cardiovascular:     Rate and Rhythm: Normal rate.     Pulses: Normal pulses.  Musculoskeletal:     Right lower leg: No edema.     Left lower leg: No edema.     Comments: Patient has good distal strength with no pain over the greater trochanters.  No clonus or focal weakness.Patient somewhat slow to rise from a seated position to full extension.  There is concordant low back pain with facet loading and lumbar spine extension rotation.  There are no definitive trigger points but the patient is somewhat tender across the lower back and PSIS.  There is no pain with hip rotation.   Skin:    Findings: No erythema, lesion or rash.  Neurological:     General: No focal deficit present.     Mental Status: She is alert and oriented to person, place, and time.     Sensory: No sensory deficit.     Motor: No weakness or abnormal muscle tone.     Coordination: Coordination normal.  Psychiatric:        Mood and Affect: Mood normal.        Behavior: Behavior normal.      Imaging: No results found.

## 2019-12-20 ENCOUNTER — Encounter: Payer: Self-pay | Admitting: Cardiovascular Disease

## 2019-12-20 ENCOUNTER — Ambulatory Visit (INDEPENDENT_AMBULATORY_CARE_PROVIDER_SITE_OTHER): Payer: Medicare Other | Admitting: Cardiovascular Disease

## 2019-12-20 ENCOUNTER — Other Ambulatory Visit: Payer: Self-pay

## 2019-12-20 VITALS — BP 130/62 | HR 71 | Ht 63.0 in | Wt 163.8 lb

## 2019-12-20 DIAGNOSIS — I10 Essential (primary) hypertension: Secondary | ICD-10-CM

## 2019-12-20 DIAGNOSIS — E785 Hyperlipidemia, unspecified: Secondary | ICD-10-CM

## 2019-12-20 DIAGNOSIS — Z720 Tobacco use: Secondary | ICD-10-CM

## 2019-12-20 DIAGNOSIS — I739 Peripheral vascular disease, unspecified: Secondary | ICD-10-CM

## 2019-12-20 DIAGNOSIS — R0602 Shortness of breath: Secondary | ICD-10-CM | POA: Diagnosis not present

## 2019-12-20 DIAGNOSIS — R072 Precordial pain: Secondary | ICD-10-CM | POA: Diagnosis not present

## 2019-12-20 NOTE — H&P (View-Only) (Signed)
Cardiology Office Note   Date:  12/20/2019   ID:  Rose [name], Rose [name] 12-07-1955, MRN [id number]  PCP:  Rose Payment, MD  Cardiologist:   Rose Sacramento, MD   No chief complaint on file.     History of Present Illness: Rose [name] is a 64 y.o. female who is here today for follow-up visit regarding peripheral arterial disease.  The patient has multiple chronic medical conditions that include type 2 diabetes, essential hypertension, hyperlipidemia, remote IV drug use and tobacco use.  She had cardiac work-up in 2018 for exertional dyspnea and atypical chest pain. A pharmacologic nuclear stress test was negative for ischemia.  Echocardiogram was also unremarkable with normal ejection fraction and grade 1 diastolic dysfunction. She is followed for peripheral arterial disease with bilateral common iliac artery stenting done in [place] 2021.  She had no significant infrainguinal disease at that time.  She had an ulceration on the right lateral foot at that time.  Postprocedure ABI in April was normal with normal velocities in the iliac stents by duplex. She continues to follow with Rose [name] for her diabetic foot ulcer which is almost completely healed.  No claudication.  She reports right-sided chest pain which is sharp happening at rest.  In addition, she also describes left substernal tightness and shortness of breath with exertion.  She is down to 2 cigarettes a day.   Past Medical History:  Diagnosis Date  . Allergic rhinitis   . Anxiety   . Arthritis   . Chronic back pain   . CKD (chronic kidney disease), stage III    folllowed by pcp  . Depression   . Diabetic neuropathy ([place])   . Environmental allergies   . Fibromyalgia   . Frequency of urination   . History of chronic bronchitis   . History of drug abuse (Rose [name]) states quit herion in 2006  . History of hepatitis C last abd ultrasound in epic 11-10-2018;  last hepative panel in epic 11-30-2018 was normal    previously followed by [facility] (notes in care everywhere)-- dx 04/ 2015,  started harvonic treatment 07/ 2015  . Hypertension    followed by pcp  (nuclear stress test 01-15-2017 in epic,  showed low risk normal w/ nuclear ef 55%)  . Methadone maintenance therapy patient ([place])   . Mild asthma    followed by pcp  . Nocturia   . PAD (peripheral artery disease) ([place])    followed by cardiology, dr Rose [name]---  06-01-2019  s/p bilateral CIA angioplasty stenting  . Right foot ulcer ([place])   . Tendonitis    bilateral wrist  . Type 2 diabetes mellitus ([place])    followed by pcp   (06-16-2019  per pt checks blood sugar every other day in am,  fasting sugar 92-95)  . Urge urinary incontinence     Past Surgical History:  Procedure Laterality Date  . ABDOMINAL AORTOGRAM W/LOWER EXTREMITY Bilateral 06/01/2019   Procedure: ABDOMINAL AORTOGRAM W/LOWER EXTREMITY;  Surgeon: Wellington Hampshire, MD;  Location: [place] [facility];  Service: Cardiovascular;  Laterality: Bilateral;  . APPENDECTOMY  1972  . INTERSTIM IMPLANT PLACEMENT  10/23/2011   Procedure: Barrie Lyme IMPLANT FIRST STAGE;  Surgeon: Reece Packer, MD;  Location: [facility];  Service: Urology;  Laterality: N/A;  . INTERSTIM IMPLANT PLACEMENT  10/23/2011   Procedure: Barrie Lyme IMPLANT SECOND STAGE;  Surgeon: Reece Packer, MD;  Location: [facility];  Service: Urology;  Laterality: N/A;  . PANACOS TISSUE GRAFT Right 06/24/2019   Procedure: Application of Skin Graft Substitute;  Surgeon: Evelina Bucy, DPM;  Location: [place];  Service: Podiatry;  Laterality: Right;  . PERIPHERAL VASCULAR INTERVENTION Bilateral 06/01/2019   Procedure: PERIPHERAL VASCULAR INTERVENTION;  Surgeon: Wellington Hampshire, MD;  Location: [place] [facility];  Service: Cardiovascular;  Laterality: Bilateral;  . WOUND DEBRIDEMENT Right 06/24/2019   Procedure: DEBRIDEMENT WOUND;  Surgeon: Evelina Bucy,  DPM;  [facility];  Service: Podiatry;  Laterality: Right;  . WOUND DEBRIDEMENT Right 08/24/2019   Procedure: DEBRIDEMENT RIGHT FOOT WOUND; APPLICATION OF SKIN GRAFT SUBSTITUTE;  Surgeon: Evelina Bucy, DPM;  Location: [place];  Service: Podiatry;  Laterality: Right;     Current Outpatient Medications  Medication Sig Dispense Refill  . albuterol (PROVENTIL HFA;VENTOLIN HFA) 108 (90 Base) MCG/ACT inhaler Inhale 2 puffs into the lungs every 6 (six) hours as needed. For wheezing 1 Inhaler 2  . aspirin EC 81 MG tablet Take 1 tablet (81 mg total) by mouth daily.    [name] atenolol (TENORMIN) 50 MG tablet TAKE 1/2 TABLET(25 MG) BY MOUTH DAILY 45 tablet 1  . atorvastatin (LIPITOR) 40 MG tablet TAKE 1 TABLET(40 MG) BY MOUTH DAILY 90 tablet 3  . buPROPion (WELLBUTRIN) 75 MG tablet Take 75 mg by mouth daily.    . cetirizine (ZYRTEC) 10 MG tablet TAKE 1 TABLET BY MOUTH DAILY 90 tablet 1  . cyclobenzaprine (FLEXERIL) 5 MG tablet Take 5 mg by mouth 3 (three) times daily as needed for muscle spasms.    . DULoxetine (CYMBALTA) 60 MG capsule Take 60 mg by mouth daily.    . fluticasone (FLONASE) 50 MCG/ACT nasal spray SHAKE LIQUID AND USE 1 SPRAY IN EACH NOSTRIL DAILY (Patient taking differently: daily as needed. SHAKE LIQUID AND USE 1 SPRAY IN EACH NOSTRIL DAILY) 16 g 5  . hydrochlorothiazide (HYDRODIURIL) 25 MG tablet TAKE 1 TABLET(25 MG) BY MOUTH DAILY 90 tablet 1  . lidocaine (XYLOCAINE) 5 % ointment Apply 1 application topically as needed. (Patient taking differently: Apply 1 application topically daily as needed for mild pain. ) 50 g 0  . metFORMIN (GLUCOPHAGE) 500 MG tablet Take 1 tablet (500 mg total) by mouth 2 (two) times daily with a meal. (Patient taking differently: Take 500 mg by mouth 2 (two) times daily with a meal. ) 180 tablet 3  . methadone (DOLOPHINE) 10 MG/5ML solution Take 100 mg by mouth daily.    . Multiple Vitamins-Minerals (ONE-A-DAY WOMENS 50+  ADVANTAGE) TABS Take 1 tablet by mouth daily.     . nicotine (NICODERM CQ - DOSED IN MG/24 HOURS) 14 mg/24hr patch Medicaid [id number] L Wks 1-6: 14 mg x 1 patch daily. Wear for 24 hours. If you have sleep disturbances, remove at bedtime. 30 patch 2  . SANTYL ointment Apply 1 application topically daily.    . silver sulfADIAZINE (SILVADENE) 1 % cream Apply pea-sized amount to wound daily. 50 g 0   No current facility-administered medications for this visit.    Allergies:   Lisinopril, Gabapentin, and Vicodin [hydrocodone-acetaminophen]    Social History:  The patient  reports that she has been smoking cigarettes. She has a 10.00 pack-year smoking history. She has never used smokeless tobacco. She reports previous drug use. She reports that she does not drink alcohol.   Family History:  The patient's family history includes CAD in her father; Diabetes type II in her brother,  mother, and sister; Heart attack (age of onset: 43) in her brother; Heart attack (age of onset: 33) in her father and mother; Hypertension in her brother and mother.    ROS:  Please see the history of present illness.   Otherwise, review of systems are positive for none.   All other systems are reviewed and negative.    PHYSICAL EXAM: VS:  BP 130/62   Pulse 71   Ht 5\' 3"  (1.6 m)   Wt 163 lb 12.8 oz (74.3 kg)   LMP 07/31/2011   SpO2 94%   BMI 29.02 kg/m  , BMI Body mass index is 29.02 kg/m. GEN: Well nourished, well developed, in no acute distress  HEENT: normal  Neck: no JVD, carotid bruits, or masses Cardiac: RRR; no murmurs, rubs, or gallops, mild bilateral leg edema Respiratory:  clear to auscultation bilaterally, normal work of breathing GI: soft, nontender, nondistended, + BS MS: no deformity or atrophy  Skin: warm and dry, no rash Neuro:  Strength and sensation are intact Psych: euthymic mood, full affect Vascular: Posterior tibial pulses palpable bilaterally.  EKG:  EKG  Is  ordered today. EKG showed  normal sinus rhythm with possible left atrial enlargement, old septal infarct.  No significant ST or T wave changes.  Recent Labs: 05/30/2019: Platelets 212 08/24/2019: BUN 17; Creatinine, Ser 1.50; Hemoglobin 12.6; Potassium 3.5; Sodium 132 09/15/2019: TSH 4.270    Lipid Panel    Component Value Date/Time   CHOL 83 (L) 11/30/2018 0912   TRIG 109 11/30/2018 0912   HDL 31 (L) 11/30/2018 0912   CHOLHDL 2.7 11/30/2018 0912   LDLCALC 32 11/30/2018 0912      Wt Readings from Last 3 Encounters:  12/20/19 163 lb 12.8 oz (74.3 kg)  10/17/19 170 lb (77.1 kg)  09/15/19 169 lb (76.7 kg)        PAD Screen 12/19/2016  Previous PAD dx? No  Previous surgical procedure? No  Pain with walking? No  Feet/toe relief with dangling? Yes  Painful, non-healing ulcers? No  Extremities discolored? No      ASSESSMENT AND PLAN:  1.  Peripheral arterial disease nonhealing ulceration on the lateral aspect of the right foot: Status post  stent placement to bilateral common iliac arteries.  No significant infrainguinal disease.  Posterior tibial pulses normal bilaterally and ulceration is almost completely healed.  Repeat aortoiliac duplex and ABI next month.  2.  Tobacco use: She is down to 2 cigarettes a day and continues to use a nicotine patch.  3.  Hyperlipidemia: Continue treatment with atorvastatin with a target LDL of less than 70.  Most recent lipid profile in September of last year showed an LDL of 32.  4.  Essential hypertension: Blood pressure is controlled.  5.  Chest pain and shortness of breath: Slightly abnormal EKG and multiple risk factors for coronary artery disease.  I requested a Lexiscan Myoview.   Disposition:   FU with me in 6 months  Signed,  Rose Sacramento, MD  12/20/2019 10:21 AM    [place]

## 2019-12-20 NOTE — Progress Notes (Signed)
Cardiology Office Note   Date:  12/20/2019   ID:  Rose [name], Rose [name] [birth year], MRN [id number]  PCP:  [name], MD  Cardiologist:   [name], MD   No chief complaint on file.     History of Present Illness: Rose [name] is a 64 y.o. female who is here today for follow-up visit regarding peripheral arterial disease.  The patient has multiple chronic medical conditions that include type 2 diabetes, essential hypertension, hyperlipidemia, remote IV drug use and tobacco use.  She had cardiac work-up in 2018 for exertional dyspnea and atypical chest pain. A pharmacologic nuclear stress test was negative for ischemia.  Echocardiogram was also unremarkable with normal ejection fraction and grade 1 diastolic dysfunction. She is followed for peripheral arterial disease with bilateral common iliac artery stenting done in March 2021.  She had no significant infrainguinal disease at that time.  She had an ulceration on the right lateral foot at that time.  Postprocedure ABI in April was normal with normal velocities in the iliac stents by duplex. She continues to follow with Dr. March Rummage for her diabetic foot ulcer which is almost completely healed.  No claudication.  She reports right-sided chest pain which is sharp happening at rest.  In addition, she also describes left substernal tightness and shortness of breath with exertion.  She is down to 2 cigarettes a day.   Past Medical History:  Diagnosis Date  . Allergic rhinitis   . Anxiety   . Arthritis   . Chronic back pain   . CKD (chronic kidney disease), stage III    folllowed by pcp  . Depression   . Diabetic neuropathy ([place])   . Environmental allergies   . Fibromyalgia   . Frequency of urination   . History of chronic bronchitis   . History of drug abuse ([place]) states quit herion in 2006  . History of hepatitis C last abd ultrasound in epic 11-10-2018;  last hepative panel in epic 11-30-2018 was normal    previously followed by [facility] (notes in care everywhere)-- dx 04/ 2015,  started harvonic treatment 07/ 2015  . Hypertension    followed by pcp  (nuclear stress test 01-15-2017 in epic,  showed low risk normal w/ nuclear ef 55%)  . Methadone maintenance therapy patient ([place])   . Mild asthma    followed by pcp  . Nocturia   . PAD (peripheral artery disease) ([place])    followed by cardiology, dr Fletcher Anon---  06-01-2019  s/p bilateral CIA angioplasty stenting  . Right foot ulcer ([place])   . Tendonitis    bilateral wrist  . Type 2 diabetes mellitus ([place])    followed by pcp   (06-16-2019  per pt checks blood sugar every other day in am,  fasting sugar 92-95)  . Urge urinary incontinence     Past Surgical History:  Procedure Laterality Date  . ABDOMINAL AORTOGRAM W/LOWER EXTREMITY Bilateral 06/01/2019   Procedure: ABDOMINAL AORTOGRAM W/LOWER EXTREMITY;  Surgeon: Wellington Hampshire, MD;  Location: [place] [facility];  Service: Cardiovascular;  Laterality: Bilateral;  . APPENDECTOMY  1972  . INTERSTIM IMPLANT PLACEMENT  10/23/2011   Procedure: Barrie Lyme IMPLANT FIRST STAGE;  Surgeon: Reece Packer, MD;  Location: [facility];  Service: Urology;  Laterality: N/A;  . INTERSTIM IMPLANT PLACEMENT  10/23/2011   Procedure: Barrie Lyme IMPLANT SECOND STAGE;  Surgeon: Reece Packer, MD;  Location: [facility];  Service: Urology;  Laterality: N/A;  . PANACOS TISSUE GRAFT Right 06/24/2019   Procedure: Application of Skin Graft Substitute;  Surgeon: Evelina Bucy, DPM;  Location: [place];  Service: Podiatry;  Laterality: Right;  . PERIPHERAL VASCULAR INTERVENTION Bilateral 06/01/2019   Procedure: PERIPHERAL VASCULAR INTERVENTION;  Surgeon: Wellington Hampshire, MD;  Location: [place] [facility];  Service: Cardiovascular;  Laterality: Bilateral;  . WOUND DEBRIDEMENT Right 06/24/2019   Procedure: DEBRIDEMENT WOUND;  Surgeon: Evelina Bucy,  DPM;  Location: [facility];  Service: Podiatry;  Laterality: Right;  . WOUND DEBRIDEMENT Right 08/24/2019   Procedure: DEBRIDEMENT RIGHT FOOT WOUND; APPLICATION OF SKIN GRAFT SUBSTITUTE;  Surgeon: Evelina Bucy, DPM;  Location: [place];  Service: Podiatry;  Laterality: Right;     Current Outpatient Medications  Medication Sig Dispense Refill  . albuterol (PROVENTIL HFA;VENTOLIN HFA) 108 (90 Base) MCG/ACT inhaler Inhale 2 puffs into the lungs every 6 (six) hours as needed. For wheezing 1 Inhaler 2  . aspirin EC 81 MG tablet Take 1 tablet (81 mg total) by mouth daily.    [name] atenolol (TENORMIN) 50 MG tablet TAKE 1/2 TABLET(25 MG) BY MOUTH DAILY 45 tablet 1  . atorvastatin (LIPITOR) 40 MG tablet TAKE 1 TABLET(40 MG) BY MOUTH DAILY 90 tablet 3  . buPROPion (WELLBUTRIN) 75 MG tablet Take 75 mg by mouth daily.    . cetirizine (ZYRTEC) 10 MG tablet TAKE 1 TABLET BY MOUTH DAILY 90 tablet 1  . cyclobenzaprine (FLEXERIL) 5 MG tablet Take 5 mg by mouth 3 (three) times daily as needed for muscle spasms.    . DULoxetine (CYMBALTA) 60 MG capsule Take 60 mg by mouth daily.    . fluticasone (FLONASE) 50 MCG/ACT nasal spray SHAKE LIQUID AND USE 1 SPRAY IN EACH NOSTRIL DAILY (Patient taking differently: daily as needed. SHAKE LIQUID AND USE 1 SPRAY IN EACH NOSTRIL DAILY) 16 g 5  . hydrochlorothiazide (HYDRODIURIL) 25 MG tablet TAKE 1 TABLET(25 MG) BY MOUTH DAILY 90 tablet 1  . lidocaine (XYLOCAINE) 5 % ointment Apply 1 application topically as needed. (Patient taking differently: Apply 1 application topically daily as needed for mild pain. ) 50 g 0  . metFORMIN (GLUCOPHAGE) 500 MG tablet Take 1 tablet (500 mg total) by mouth 2 (two) times daily with a meal. (Patient taking differently: Take 500 mg by mouth 2 (two) times daily with a meal. ) 180 tablet 3  . methadone (DOLOPHINE) 10 MG/5ML solution Take 100 mg by mouth daily.    . Multiple Vitamins-Minerals (ONE-A-DAY WOMENS 50+  ADVANTAGE) TABS Take 1 tablet by mouth daily.     . nicotine (NICODERM CQ - DOSED IN MG/24 HOURS) 14 mg/24hr patch Medicaid [id number] L Wks 1-6: 14 mg x 1 patch daily. Wear for 24 hours. If you have sleep disturbances, remove at bedtime. 30 patch 2  . SANTYL ointment Apply 1 application topically daily.    . silver sulfADIAZINE (SILVADENE) 1 % cream Apply pea-sized amount to wound daily. 50 g 0   No current facility-administered medications for this visit.    Allergies:   Lisinopril, Gabapentin, and Vicodin [hydrocodone-acetaminophen]    Social History:  The patient  reports that she has been smoking cigarettes. She has a 10.00 pack-year smoking history. She has never used smokeless tobacco. She reports previous drug use. She reports that she does not drink alcohol.   Family History:  The patient's family history includes CAD in her father; Diabetes type II in her brother,  mother, and sister; Heart attack (age of onset: 6) in her brother; Heart attack (age of onset: 86) in her father and mother; Hypertension in her brother and mother.    ROS:  Please see the history of present illness.   Otherwise, review of systems are positive for none.   All other systems are reviewed and negative.    PHYSICAL EXAM: VS:  BP 130/62   Pulse 71   Ht 5\' 3"  (1.6 m)   Wt 163 lb 12.8 oz (74.3 kg)   LMP 07/31/2011   SpO2 94%   BMI 29.02 kg/m  , BMI Body mass index is 29.02 kg/m. GEN: Well nourished, well developed, in no acute distress  HEENT: normal  Neck: no JVD, carotid bruits, or masses Cardiac: RRR; no murmurs, rubs, or gallops, mild bilateral leg edema Respiratory:  clear to auscultation bilaterally, normal work of breathing GI: soft, nontender, nondistended, + BS MS: no deformity or atrophy  Skin: warm and dry, no rash Neuro:  Strength and sensation are intact Psych: euthymic mood, full affect Vascular: Posterior tibial pulses palpable bilaterally.  EKG:  EKG  Is  ordered today. EKG showed  normal sinus rhythm with possible left atrial enlargement, old septal infarct.  No significant ST or T wave changes.  Recent Labs: 05/30/2019: Platelets 212 08/24/2019: BUN 17; Creatinine, Ser 1.50; Hemoglobin 12.6; Potassium 3.5; Sodium 132 09/15/2019: TSH 4.270    Lipid Panel    Component Value Date/Time   CHOL 83 (L) 11/30/2018 0912   TRIG 109 11/30/2018 0912   HDL 31 (L) 11/30/2018 0912   CHOLHDL 2.7 11/30/2018 0912   LDLCALC 32 11/30/2018 0912      Wt Readings from Last 3 Encounters:  12/20/19 163 lb 12.8 oz (74.3 kg)  10/17/19 170 lb (77.1 kg)  09/15/19 169 lb (76.7 kg)        PAD Screen 12/19/2016  Previous PAD dx? No  Previous surgical procedure? No  Pain with walking? No  Feet/toe relief with dangling? Yes  Painful, non-healing ulcers? No  Extremities discolored? No      ASSESSMENT AND PLAN:  1.  Peripheral arterial disease nonhealing ulceration on the lateral aspect of the right foot: Status post  stent placement to bilateral common iliac arteries.  No significant infrainguinal disease.  Posterior tibial pulses normal bilaterally and ulceration is almost completely healed.  Repeat aortoiliac duplex and ABI next month.  2.  Tobacco use: She is down to 2 cigarettes a day and continues to use a nicotine patch.  3.  Hyperlipidemia: Continue treatment with atorvastatin with a target LDL of less than 70.  Most recent lipid profile in September of last year showed an LDL of 32.  4.  Essential hypertension: Blood pressure is controlled.  5.  Chest pain and shortness of breath: Slightly abnormal EKG and multiple risk factors for coronary artery disease.  I requested a Lexiscan Myoview.   Disposition:   FU with me in 6 months  Signed,  [name], MD  12/20/2019 10:21 AM    [place]

## 2019-12-20 NOTE — Patient Instructions (Signed)
Medication Instructions:  No changes *If you need a refill on your cardiac medications before your next appointment, please call your pharmacy*   Lab Work: None ordered If you have labs (blood work) drawn today and your tests are completely normal, you will receive your results only by:  [place] (if you have MyChart) OR  A paper copy in the mail If you have any lab test that is abnormal or we need to change your treatment, we will call you to review the results.   Testing/Procedures: Your physician has requested that you have a lexiscan myoview. For further information please visit [email or web address]. Please follow instruction sheet, as given. This will take place at [address], [address]  How to prepare for your Myocardial Perfusion Test:  Do not eat or drink 3 hours prior to your test, except you may have water.  Do not consume products containing caffeine (regular or decaffeinated) 12 hours prior to your test. (ex: coffee, chocolate, sodas, tea).  Do bring a list of your current medications with you.  If not listed below, you may take your medications as normal.  Do wear comfortable clothes (no dresses or overalls) and walking shoes, tennis shoes preferred (No heels or open toe shoes are allowed).  Do NOT wear cologne, perfume, aftershave, or lotions (deodorant is allowed).  The test will take approximately 3 to 4 hours to complete  If these instructions are not followed, your test will have to be rescheduled.  Your physician has requested that you have an Aorta/Iliac Duplex. This will be take place at [place], [address].    No food after 11PM the night before.  Water is OK. (Don't drink liquids if you have been instructed not to for ANOTHER test).  Take two Extra-Strength Gas-X capsules at bedtime the night before test.   Take an additional two Extra-Strength Gas-X capsules three (3) hours before the test or first thing in the morning.     Avoid foods that produce bowel gas, for 24 hours prior to exam (see below).    No breakfast, no chewing gum, no smoking or carbonated beverages.  Patient may take morning medications with water.  Come in for test at least 15 minutes early to register. Your physician has requested that you have an ankle brachial index (ABI). During this test an ultrasound and blood pressure cuff are used to evaluate the arteries that supply the arms and legs with blood. Allow thirty minutes for this exam. There are no restrictions or special instructions. This will take place at [place], [address].    Follow-Up: At [facility], you and your health needs are our priority.  As part of our continuing mission to provide you with exceptional heart care, we have created designated Provider Care Teams.  These Care Teams include your primary Cardiologist (physician) and Advanced Practice Providers (APPs -  Physician Assistants and Nurse Practitioners) who all work together to provide you with the care you need, when you need it.  We recommend signing up for the patient portal called "MyChart".  Sign up information is provided on this After Visit Summary.  MyChart is used to connect with patients for Virtual Visits (Telemedicine).  Patients are able to view lab/test results, encounter notes, upcoming appointments, etc.  Non-urgent messages can be sent to your provider as well.   To learn more about what you can do with MyChart, go to [email or web address].    Your next appointment:   6  month(s)  The format for your next appointment:   In Person  Provider:   [name], MD

## 2019-12-22 ENCOUNTER — Telehealth: Payer: Self-pay

## 2019-12-22 NOTE — Telephone Encounter (Signed)
Called pt to sch her for RFA. Pt didn't answer and ivm#1

## 2019-12-23 ENCOUNTER — Telehealth (HOSPITAL_COMMUNITY): Payer: Self-pay | Admitting: *Deleted

## 2019-12-23 NOTE — Telephone Encounter (Signed)
Close encounter 

## 2019-12-27 ENCOUNTER — Ambulatory Visit (INDEPENDENT_AMBULATORY_CARE_PROVIDER_SITE_OTHER): Payer: Medicare Other | Admitting: Podiatry

## 2019-12-27 ENCOUNTER — Other Ambulatory Visit: Payer: Self-pay

## 2019-12-27 ENCOUNTER — Encounter: Payer: Self-pay | Admitting: Podiatry

## 2019-12-27 ENCOUNTER — Telehealth (HOSPITAL_COMMUNITY): Payer: Self-pay | Admitting: *Deleted

## 2019-12-27 DIAGNOSIS — L97511 Non-pressure chronic ulcer of other part of right foot limited to breakdown of skin: Secondary | ICD-10-CM | POA: Diagnosis not present

## 2019-12-27 DIAGNOSIS — E08621 Diabetes mellitus due to underlying condition with foot ulcer: Secondary | ICD-10-CM

## 2019-12-27 NOTE — Telephone Encounter (Signed)
Close encounter 

## 2019-12-28 ENCOUNTER — Ambulatory Visit (HOSPITAL_COMMUNITY)
Admission: RE | Admit: 2019-12-28 | Payer: Medicare Other | Source: Ambulatory Visit | Attending: Cardiovascular Disease | Admitting: Cardiovascular Disease

## 2019-12-28 ENCOUNTER — Telehealth (HOSPITAL_COMMUNITY): Payer: Self-pay | Admitting: *Deleted

## 2019-12-28 NOTE — Telephone Encounter (Signed)
Close encounter 

## 2019-12-28 NOTE — Progress Notes (Signed)
  Subjective:  Patient ID: Rose [name], female    DOB: [birth year],  MRN: [id number]  Chief Complaint  Patient presents with  . Foot Ulcer    right foot,side of 5th met., having a little pain on ball of foot near ulcer area    64 y.o. female presents for wound care. Hx confirmed with patient. Objective:  Physical Exam: Wound Location: right lateral 5th. Appears epithelialized today. No warmth erythema signs of acute infection. Mild hyperkeratosis.  Assessment:   1. Diabetic ulcer of other part of right foot associated with diabetes mellitus due to underlying condition, limited to breakdown of skin [facility])    Plan:  Patient was evaluated and treated and all questions answered.  Ulcer right foot -Wound appears epithelilalized. No debridement today -Apply ointment daily to keep area soft -F/u promptly should ulcer recur.  Return in about 6 weeks (around 02/07/2020).

## 2019-12-29 ENCOUNTER — Other Ambulatory Visit: Payer: Self-pay

## 2019-12-29 ENCOUNTER — Ambulatory Visit (HOSPITAL_COMMUNITY)
Admission: RE | Admit: 2019-12-29 | Discharge: 2019-12-29 | Disposition: A | Discharge status: Home / Self Care | Payer: Medicare Other | Source: Ambulatory Visit | Attending: Cardiology | Admitting: Cardiology

## 2019-12-29 DIAGNOSIS — R072 Precordial pain: Secondary | ICD-10-CM | POA: Diagnosis not present

## 2019-12-29 LAB — MYOCARDIAL PERFUSION IMAGING
LV dias vol: 54 mL (ref 46–106)
LV sys vol: 19 mL
Peak HR: 88 {beats}/min
Rest HR: 61 {beats}/min
SDS: 15
SRS: 0
SSS: 15
TID: 0.71

## 2019-12-29 MED ORDER — REGADENOSON 0.4 MG/5ML IV SOLN
0.4000 mg | Freq: Once | INTRAVENOUS | Status: AC
Start: 2019-12-29 — End: 2019-12-29
  Administered 2019-12-29: 0.4 mg via INTRAVENOUS

## 2019-12-29 MED ORDER — TECHNETIUM TC 99M TETROFOSMIN IV KIT
30.1000 | PACK | Freq: Once | INTRAVENOUS | Status: AC | PRN
  Administered 2019-12-29: 30.1 via INTRAVENOUS
  Filled 2019-12-29: qty 31

## 2019-12-29 MED ORDER — TECHNETIUM TC 99M TETROFOSMIN IV KIT
10.9000 | PACK | Freq: Once | INTRAVENOUS | Status: AC | PRN
  Administered 2019-12-29: 10.9 via INTRAVENOUS
  Filled 2019-12-29: qty 11

## 2019-12-30 NOTE — Telephone Encounter (Signed)
Called pt and ivm# 2

## 2020-01-02 NOTE — Telephone Encounter (Signed)
Called pt and lvm #2 

## 2020-01-03 ENCOUNTER — Encounter: Payer: Self-pay | Admitting: *Deleted

## 2020-01-03 ENCOUNTER — Telehealth: Payer: Self-pay | Admitting: *Deleted

## 2020-01-03 DIAGNOSIS — R072 Precordial pain: Secondary | ICD-10-CM

## 2020-01-03 DIAGNOSIS — Z01812 Encounter for preprocedural laboratory examination: Secondary | ICD-10-CM

## 2020-01-03 NOTE — Telephone Encounter (Signed)
The patient has been made aware of the results of her stress test with the recommendation to proceed with a left heart cath per Dr. Fletcher Anon. The patient has agreed and has stated that next Wednesday works best for her.      [facility] [address] [place] [address] Dept: [phone] Loc: [place]  01/03/2020  You are scheduled for a Cardiac Catheterization on Wednesday, October 13 with Dr. [name].  1. Please arrive at the [facility] (Main Entrance A) at [facility]: [address] [place], [place] [address] at 10:30 AM (This time is two hours before your procedure to ensure your preparation). Free valet parking service is available.   Special note: Every effort is made to have your procedure done on time. Please understand that emergencies sometimes delay scheduled procedures.  2. Diet: Do not eat solid foods after midnight.  The patient may have clear liquids until 5am upon the day of the procedure.  3. Labs: You will need to have blood drawn on Friday, October 8 at [place]  Open: [place] (Lunch 12:30 - 1:30)   Phone: [phone]. You do not need to be fasting.  You will need to have the coronavirus test completed prior to your procedure. An appointment has been made at 11:55 am on 01/07/20. This is a Drive Up Visit at [address], [place], [place] [address]. Please tell them that you are there for procedure testing. Stay in your car and someone will be with you shortly. Please make sure to have all other labs completed before this test because you will need to stay quarantined until your procedure.   4. Medication instructions in preparation for your procedure: Hold the Hydrochlorothiazide the morning of the procedure Hold the Metformin the morning of the procedure and then 48 hours after.   On the  morning of your procedure, take your Aspirin and any morning medicines NOT listed above.  You may use sips of water.  5. Plan for one night stay--bring personal belongings. 6. Bring a current list of your medications and current insurance cards. 7. You MUST have a responsible person to drive you home. 8. Someone MUST be with you the first 24 hours after you arrive home or your discharge will be delayed. 9. Please wear clothes that are easy to get on and off and wear slip-on shoes.  Thank you for allowing [place] to care for you!   -- [facility]

## 2020-01-04 ENCOUNTER — Other Ambulatory Visit: Payer: Self-pay

## 2020-01-04 MED ORDER — DULOXETINE HCL 20 MG PO CPEP: 20.0000 mg | ORAL_CAPSULE | Freq: Every day | ORAL | 2 refills | Status: DC

## 2020-01-04 NOTE — Telephone Encounter (Signed)
DULoxetine (CYMBALTA) 20 MG capsule, REFILL REQUEST @  [id number] - [place], [place] Phone:  [phone]  Fax:  [phone]

## 2020-01-06 ENCOUNTER — Emergency Department (HOSPITAL_BASED_OUTPATIENT_CLINIC_OR_DEPARTMENT_OTHER): Payer: Medicare Other

## 2020-01-06 ENCOUNTER — Emergency Department (HOSPITAL_COMMUNITY)
Admission: EM | Admit: 2020-01-06 | Discharge: 2020-01-06 | Disposition: A | Discharge status: Home / Self Care | Payer: Medicare Other | Attending: Emergency Medicine | Admitting: Emergency Medicine

## 2020-01-06 ENCOUNTER — Emergency Department (HOSPITAL_COMMUNITY): Payer: Medicare Other

## 2020-01-06 ENCOUNTER — Other Ambulatory Visit: Payer: Self-pay

## 2020-01-06 DIAGNOSIS — Z79899 Other long term (current) drug therapy: Secondary | ICD-10-CM | POA: Diagnosis not present

## 2020-01-06 DIAGNOSIS — L97511 Non-pressure chronic ulcer of other part of right foot limited to breakdown of skin: Secondary | ICD-10-CM | POA: Insufficient documentation

## 2020-01-06 DIAGNOSIS — Z7984 Long term (current) use of oral hypoglycemic drugs: Secondary | ICD-10-CM | POA: Diagnosis not present

## 2020-01-06 DIAGNOSIS — F17210 Nicotine dependence, cigarettes, uncomplicated: Secondary | ICD-10-CM | POA: Diagnosis not present

## 2020-01-06 DIAGNOSIS — E1140 Type 2 diabetes mellitus with diabetic neuropathy, unspecified: Secondary | ICD-10-CM | POA: Diagnosis not present

## 2020-01-06 DIAGNOSIS — R600 Localized edema: Secondary | ICD-10-CM | POA: Diagnosis not present

## 2020-01-06 DIAGNOSIS — Z7982 Long term (current) use of aspirin: Secondary | ICD-10-CM | POA: Diagnosis not present

## 2020-01-06 DIAGNOSIS — M7989 Other specified soft tissue disorders: Secondary | ICD-10-CM | POA: Diagnosis not present

## 2020-01-06 DIAGNOSIS — R2241 Localized swelling, mass and lump, right lower limb: Secondary | ICD-10-CM | POA: Diagnosis not present

## 2020-01-06 DIAGNOSIS — R2240 Localized swelling, mass and lump, unspecified lower limb: Secondary | ICD-10-CM | POA: Diagnosis present

## 2020-01-06 DIAGNOSIS — J8489 Other specified interstitial pulmonary diseases: Secondary | ICD-10-CM | POA: Diagnosis not present

## 2020-01-06 DIAGNOSIS — J45909 Unspecified asthma, uncomplicated: Secondary | ICD-10-CM | POA: Diagnosis not present

## 2020-01-06 DIAGNOSIS — I10 Essential (primary) hypertension: Secondary | ICD-10-CM | POA: Diagnosis not present

## 2020-01-06 DIAGNOSIS — Z872 Personal history of diseases of the skin and subcutaneous tissue: Secondary | ICD-10-CM | POA: Diagnosis not present

## 2020-01-06 DIAGNOSIS — N183 Chronic kidney disease, stage 3 unspecified: Secondary | ICD-10-CM | POA: Insufficient documentation

## 2020-01-06 DIAGNOSIS — E11621 Type 2 diabetes mellitus with foot ulcer: Secondary | ICD-10-CM | POA: Insufficient documentation

## 2020-01-06 DIAGNOSIS — L03115 Cellulitis of right lower limb: Secondary | ICD-10-CM | POA: Diagnosis not present

## 2020-01-06 DIAGNOSIS — I129 Hypertensive chronic kidney disease with stage 1 through stage 4 chronic kidney disease, or unspecified chronic kidney disease: Secondary | ICD-10-CM | POA: Insufficient documentation

## 2020-01-06 DIAGNOSIS — Z8679 Personal history of other diseases of the circulatory system: Secondary | ICD-10-CM

## 2020-01-06 DIAGNOSIS — I700 Atherosclerosis of aorta: Secondary | ICD-10-CM | POA: Diagnosis not present

## 2020-01-06 LAB — CBC WITH DIFFERENTIAL/PLATELET
Abs Immature Granulocytes: 0.03 10*3/uL (ref 0.00–0.07)
Basophils Absolute: 0 10*3/uL (ref 0.0–0.1)
Basophils Relative: 0 %
Eosinophils Absolute: 0.1 10*3/uL (ref 0.0–0.5)
Eosinophils Relative: 1 %
HCT: 34.5 % — ABNORMAL LOW (ref 36.0–46.0)
Hemoglobin: 10.9 g/dL — ABNORMAL LOW (ref 12.0–15.0)
Immature Granulocytes: 0 %
Lymphocytes Relative: 23 %
Lymphs Abs: 2.3 10*3/uL (ref 0.7–4.0)
MCH: 26.5 pg (ref 26.0–34.0)
MCHC: 31.6 g/dL (ref 30.0–36.0)
MCV: 83.9 fL (ref 80.0–100.0)
Monocytes Absolute: 0.6 10*3/uL (ref 0.1–1.0)
Monocytes Relative: 6 %
Neutro Abs: 6.8 10*3/uL (ref 1.7–7.7)
Neutrophils Relative %: 70 %
Platelets: 211 10*3/uL (ref 150–400)
RBC: 4.11 MIL/uL (ref 3.87–5.11)
RDW: 12.5 % (ref 11.5–15.5)
WBC: 9.8 10*3/uL (ref 4.0–10.5)
nRBC: 0 % (ref 0.0–0.2)

## 2020-01-06 LAB — BASIC METABOLIC PANEL
Anion gap: 10 (ref 5–15)
BUN: 13 mg/dL (ref 8–23)
CO2: 27 mmol/L (ref 22–32)
Calcium: 9 mg/dL (ref 8.9–10.3)
Chloride: 93 mmol/L — ABNORMAL LOW (ref 98–111)
Creatinine, Ser: 1.3 mg/dL — ABNORMAL HIGH (ref 0.44–1.00)
GFR, Estimated: 44 mL/min — ABNORMAL LOW (ref >60)
Glucose, Bld: 130 mg/dL — ABNORMAL HIGH (ref 70–99)
Potassium: 4 mmol/L (ref 3.5–5.1)
Sodium: 130 mmol/L — ABNORMAL LOW (ref 135–145)

## 2020-01-06 MED ORDER — DOXYCYCLINE HYCLATE 100 MG PO CAPS: 100.0000 mg | ORAL_CAPSULE | Freq: Two times a day (BID) | ORAL | 0 refills | Status: DC

## 2020-01-06 MED ORDER — LIDOCAINE HCL (PF) 1 % IJ SOLN
INTRAMUSCULAR | Status: AC
  Administered 2020-01-06: 2.1 mg
  Filled 2020-01-06: qty 5

## 2020-01-06 MED ORDER — LIDOCAINE HCL (PF) 1 % IJ SOLN: 5.0000 mL | Freq: Once | INTRAMUSCULAR | Status: AC

## 2020-01-06 MED ORDER — CEFTRIAXONE SODIUM 1 G IJ SOLR
1.0000 g | Freq: Once | INTRAMUSCULAR | Status: AC
  Administered 2020-01-06: 1 g via INTRAMUSCULAR
  Filled 2020-01-06: qty 10

## 2020-01-06 NOTE — ED Triage Notes (Signed)
Pt noticed swelling in her legs 10/7. Pt elevated legs and this morning noticed her right leg bleeding. Pt was concerned and came to ED. Pt has 1+ pitting edema on assessment.

## 2020-01-06 NOTE — Discharge Instructions (Signed)
Follow up with primary care provider in 2 days  Return for new or worsening symptoms.

## 2020-01-06 NOTE — ED Provider Notes (Signed)
[facility] EMERGENCY DEPARTMENT Provider Note   CSN: [id number] Arrival date & time: 01/06/20  9767     History Chief Complaint  Patient presents with  . Leg Pain    Rose [name] is a 64 y.o. female with past medical history significant for chronic back pain, CKD, diabetic neuropathy, remote history of drug abuse, hepatitis C, hypertension, right diabetic foot ulcer, PAD who presents for evaluation of leg swelling.  Patient states she has had some increased swelling in her legs over the last week.  She noted this morning that her right leg seemed larger than her left.  She also noticed a wound to her left calf that was bleeding.  States she has chronic diabetic neuropathy.  She is [facility] for right fifth metatarsal diabetic ulcer.  Has a left heart cath scheduled on 01/11/20.  She is not on any antibiotics for her diabetic foot ulcer.  Per last podiatry note approximately 10 days ago wound appeared epithelialized with no warmth, erythema or signs of acute infection.  Has appointment to follow-up in 1 month.  She is supposed to follow-up of her ulcer worsens.  Patient is unable to tell me if her ulcer has worsened.  Has mild pain to the area however unable to tell increased pain due to her diabetic neuropathy.  She has not noticed any bleeding or drainage.  Not noticed any redness or warmth.  Has been able to ambulate.  Patient denies any use of any anticoagulation when asked if she has a history of PE or DVT she states "they caught it before it could get there."  She is not in any Lasix for her lower extremity edema.  Denies fever, chills, nausea, vomiting, chest pain, shortness of breath, hemoptysis, abdominal pain, weakness.  Denies additional aggravating or alleviating factors.  History obtained from patient and past medical records. No interpreter used.  HPI     Past Medical History:  Diagnosis Date  . Allergic rhinitis   . Anxiety     . Arthritis   . Chronic back pain   . CKD (chronic kidney disease), stage III    folllowed by pcp  . Depression   . Diabetic neuropathy ([place])   . Environmental allergies   . Fibromyalgia   . Frequency of urination   . History of chronic bronchitis   . History of drug abuse ([place]) states quit herion in 2006  . History of hepatitis C last abd ultrasound in epic 11-10-2018;  last hepative panel in epic 11-30-2018 was normal   previously followed by [facility] (notes in care everywhere)-- dx 04/ 2015,  started harvonic treatment 07/ 2015  . Hypertension    followed by pcp  (nuclear stress test 01-15-2017 in epic,  showed low risk normal w/ nuclear ef 55%)  . Methadone maintenance therapy patient ([place])   . Mild asthma    followed by pcp  . Nocturia   . PAD (peripheral artery disease) ([place])    followed by cardiology, dr Fletcher Anon---  06-01-2019  s/p bilateral CIA angioplasty stenting  . Right foot ulcer ([place])   . Tendonitis    bilateral wrist  . Type 2 diabetes mellitus ([place])    followed by pcp   (06-16-2019  per pt checks blood sugar every other day in am,  fasting sugar 92-95)  . Urge urinary incontinence     Patient Active Problem List   Diagnosis Date Noted  . PAD (  peripheral artery disease) ([place]) 08/18/2019  . Diabetic foot ulcer associated with type 2 diabetes mellitus ([place]) 06/21/2019  . Encounter for completion of form with patient 06/21/2019  . Laceration of right foot 03/04/2019  . Edema of lower extremity 10/28/2018  . Allergic rhinitis 10/28/2018  . Calculus of gallbladder without cholecystitis without obstruction 10/28/2018  . History of rib fracture 10/28/2018  . Lipoma of neck 07/16/2017  . Diabetic neuropathy ([place]) 06/02/2017  . HPV in female 06/11/2016  . Recurrent falls 04/30/2016  . ACE inhibitor-aggravated angioedema, initial encounter 04/26/2016  . Environmental allergies 12/28/2015  . Hyperlipidemia associated with type 2 diabetes mellitus  ([place]) 12/13/2015  . Tobacco use disorder 12/13/2015  . Chronic renal insufficiency 12/12/2015  . Depression 12/12/2015  . Methadone maintenance therapy patient ([place])   . History of drug abuse ([place])   . Chronic back pain   . Preventative health care 12/14/2014  . Type 2 diabetes mellitus ([place]) 02/27/2012  . Essential hypertension 02/27/2012    Past Surgical History:  Procedure Laterality Date  . ABDOMINAL AORTOGRAM W/LOWER EXTREMITY Bilateral 06/01/2019   Procedure: ABDOMINAL AORTOGRAM W/LOWER EXTREMITY;  Surgeon: Wellington Hampshire, MD;  Location: [facility];  Service: Cardiovascular;  Laterality: Bilateral;  . APPENDECTOMY  1972  . INTERSTIM IMPLANT PLACEMENT  10/23/2011   Procedure: Barrie Lyme IMPLANT FIRST STAGE;  Surgeon: Reece Packer, MD;  Location[facility];  Service: Urology;  Laterality: N/A;  . INTERSTIM IMPLANT PLACEMENT  10/23/2011   Procedure: Barrie Lyme IMPLANT SECOND STAGE;  Surgeon: Reece Packer, MD;  Location: [facility];  Service: Urology;  Laterality: N/A;  . PANACOS TISSUE GRAFT Right 06/24/2019   Procedure: Application of Skin Graft Substitute;  Surgeon: Evelina Bucy, DPM;  Location: [facility];  Service: Podiatry;  Laterality: Right;  . PERIPHERAL VASCULAR INTERVENTION Bilateral 06/01/2019   Procedure: PERIPHERAL VASCULAR INTERVENTION;  Surgeon: Wellington Hampshire, MD;  Location: [facility];  Service: Cardiovascular;  Laterality: Bilateral;  . WOUND DEBRIDEMENT Right 06/24/2019   Procedure: DEBRIDEMENT WOUND;  Surgeon: Evelina Bucy, DPM;  Location: [facility];  Service: Podiatry;  Laterality: Right;  . WOUND DEBRIDEMENT Right 08/24/2019   Procedure: DEBRIDEMENT RIGHT FOOT WOUND; APPLICATION OF SKIN GRAFT SUBSTITUTE;  Surgeon: Evelina Bucy, DPM;  Location: [facility];  Service: Podiatry;  Laterality: Right;     OB History   No obstetric history on file.      Family History  Problem Relation Age of Onset  . Hypertension Mother   . Diabetes type II Mother   . Heart attack Mother 64  . CAD Father   . Heart attack Father 80  . Hypertension Brother   . Diabetes type II Brother   . Heart attack Brother 47  . Diabetes type II Sister     Social History   Tobacco Use  . Smoking status: Current Every Day Smoker    Packs/day: 0.50    Years: 20.00    Pack years: 10.00    Types: Cigarettes  . Smokeless tobacco: Never Used  Vaping Use  . Vaping Use: Never used  Substance Use Topics  . Alcohol use: No    Alcohol/week: 0.0 standard drinks  . Drug use: Not Currently    Comment: hx heroin use, IV,  last used 2006 (currently on methadone)    Home Medications Prior to Admission medications   Medication Sig Start Date End Date Taking? Authorizing Provider  albuterol (PROVENTIL HFA;VENTOLIN HFA) 108 (90 Base) MCG/ACT inhaler Inhale 2 puffs into the lungs every 6 (six) hours as needed. For wheezing 04/19/15   Mignon Pine, DO  aspirin EC 81 MG tablet Take 1 tablet (81 mg total) by mouth daily. 12/19/16   End, Harrell Gave, MD  atenolol (TENORMIN) 50 MG tablet TAKE 1/2 TABLET(25 MG) BY MOUTH DAILY 09/26/19   [name], MD  atorvastatin (LIPITOR) 40 MG tablet TAKE 1 TABLET(40 MG) BY MOUTH DAILY 11/22/19   Asencion Noble, MD  buPROPion (WELLBUTRIN) 75 MG tablet Take 75 mg by mouth daily. 08/11/18   [provider]  cetirizine (ZYRTEC) 10 MG tablet TAKE 1 TABLET BY MOUTH DAILY 10/24/19   [name], MD  cyclobenzaprine (FLEXERIL) 5 MG tablet Take 5 mg by mouth 3 (three) times daily as needed for muscle spasms.    [provider]  doxycycline (VIBRAMYCIN) 100 MG capsule Take 1 capsule (100 mg total) by mouth 2 (two) times daily. 01/06/20   Leiloni Smithers A, PA-C  DULoxetine (CYMBALTA) 20 MG capsule Take 1 capsule (20 mg total) by mouth daily. 01/04/20 04/03/20  [name], MD  fluticasone (FLONASE) 50 MCG/ACT nasal spray  SHAKE LIQUID AND USE 1 SPRAY IN EACH NOSTRIL DAILY Patient taking differently: daily as needed. SHAKE LIQUID AND USE 1 SPRAY IN EACH NOSTRIL DAILY 01/04/19   Madalyn Rob, MD  hydrochlorothiazide (HYDRODIURIL) 25 MG tablet TAKE 1 TABLET(25 MG) BY MOUTH DAILY 06/29/19   [name], MD  lidocaine (XYLOCAINE) 5 % ointment Apply 1 application topically as needed. Patient taking differently: Apply 1 application topically daily as needed for mild pain.  03/11/18   Isabelle Course, MD  metFORMIN (GLUCOPHAGE) 500 MG tablet Take 1 tablet (500 mg total) by mouth 2 (two) times daily with a meal. Patient taking differently: Take 500 mg by mouth 2 (two) times daily with a meal.  02/06/19   [name], MD  methadone (DOLOPHINE) 10 MG/5ML solution Take 100 mg by mouth daily.    [provider]  Multiple Vitamins-Minerals (ONE-A-DAY WOMENS 50+ ADVANTAGE) TABS Take 1 tablet by mouth daily.     [provider]  nicotine (NICODERM CQ - DOSED IN MG/24 HOURS) 14 mg/24hr patch Medicaid [id number] L Wks 1-6: 14 mg x 1 patch daily. Wear for 24 hours. If you have sleep disturbances, remove at bedtime. 09/15/19   Earlene Plater, MD  SANTYL ointment Apply 1 application topically daily. 05/25/19   [provider]  silver sulfADIAZINE (SILVADENE) 1 % cream Apply pea-sized amount to wound daily. 04/08/19   Evelina Bucy, DPM    Allergies    Lisinopril, Gabapentin, and Vicodin [hydrocodone-acetaminophen]  Review of Systems   Review of Systems  Constitutional: Negative.   HENT: Negative.   Respiratory: Negative.   Cardiovascular: Negative.   Gastrointestinal: Negative.   Genitourinary: Negative.   Musculoskeletal:       BL leg pain and swelling  Skin: Positive for wound.  Neurological: Negative.   All other systems reviewed and are negative.   Physical Exam Updated Vital Signs BP (!) 124/57   Pulse (!) 56   Temp 97.8 F (36.6 C) (Oral)   Resp 10   Ht 5\' 3"  (1.6 m)   Wt 74.8 kg    LMP 07/31/2011   SpO2 98%   BMI 29.23 kg/m   Physical Exam Vitals and nursing note reviewed.  Constitutional:      General: She is not in acute distress.  Appearance: She is well-developed. She is ill-appearing (Chronically ill). She is not toxic-appearing or diaphoretic.  HENT:     Head: Normocephalic and atraumatic.     Nose: Nose normal.     Mouth/Throat:     Mouth: Mucous membranes are moist.  Eyes:     Pupils: Pupils are equal, round, and reactive to light.  Cardiovascular:     Rate and Rhythm: Normal rate.     Pulses:          Dorsalis pedis pulses are detected w/ Doppler on the right side and detected w/ Doppler on the left side.     Heart sounds: Normal heart sounds.  Pulmonary:     Effort: Pulmonary effort is normal. No respiratory distress.     Breath sounds: Normal breath sounds.  Abdominal:     General: Bowel sounds are normal. There is no distension.     Tenderness: There is no abdominal tenderness. There is no right CVA tenderness, left CVA tenderness, guarding or rebound.  Musculoskeletal:        General: Normal range of motion.     Cervical back: Normal range of motion.     Comments: Moves all 4 extremities without difficulty.  Compartments soft.  No bony tenderness.  Bilateral healed skin wounds to lower extremities.  1+ pitting edema to knees left lower extremity, 2+ pitting edema right lower extremity.  Wiggles toes without difficulty bilaterally.  Feet:     Right foot:     Skin integrity: Ulcer present.     Toenail Condition: Right toenails are abnormally thick and long.     Left foot:     Skin integrity: Skin integrity normal.     Toenail Condition: Left toenails are abnormally thick and long.     Comments: 1.5 cm, rounded ulceration to right fifth metatarsal head.  No surrounding erythema or warmth.  Partially epithelialized.  No bleeding or drainage. Skin:    General: Skin is warm and dry.     Capillary Refill: Capillary refill takes 2 to 3 seconds.       Comments: Erythema mild warmth to right anterior lower extremity proximal to ankle, terminates mid shin.  No fluctuance or induration.  No well demarcated borders.  Skin tear to left lateral midshaft calf.  No bleeding or drainage.  Neurological:     Mental Status: She is alert.     Cranial Nerves: Cranial nerves are intact.     Sensory: Sensation is intact.     Motor: Motor function is intact.     Gait: Gait is intact.     Comments: Intact sensation Ambulatory 5/5 strength bilateral extremities without difficulty           ED Results / Procedures / Treatments   Labs (all labs ordered are listed, but only abnormal results are displayed) Labs Reviewed  CBC WITH DIFFERENTIAL/PLATELET - Abnormal; Notable for the following components:      Result Value   Hemoglobin 10.9 (*)    HCT 34.5 (*)    All other components within normal limits  BASIC METABOLIC PANEL - Abnormal; Notable for the following components:   Sodium 130 (*)    Chloride 93 (*)    Glucose, Bld 130 (*)    Creatinine, Ser 1.30 (*)    GFR, Estimated 44 (*)    All other components within normal limits    EKG EKG Interpretation  Date/Time:  Friday January 06 2020 10:36:24 EDT Ventricular Rate:  59 PR Interval:  QRS Duration: 92 QT Interval:  493 QTC Calculation: 489 R Axis:   73 Text Interpretation: Sinus or ectopic atrial rhythm Probable left atrial enlargement Probable anteroseptal infarct, old Confirmed by Sherwood Gambler [phone]) on 01/06/2020 10:49:11 AM   Radiology DG Chest 2 View  Result Date: 01/06/2020 CLINICAL DATA:  Lower extremity edema EXAM: CHEST - 2 VIEW COMPARISON:  October 26, 2018 FINDINGS: Shallow degree of inspiration again noted. Interstitial thickening in the lung bases. Lungs elsewhere clear. Heart size and pulmonary vascularity are normal. No adenopathy. There is aortic atherosclerosis. No bone lesions. IMPRESSION: Shallow degree of inspiration. Interstitial thickening in the lung  bases. Mild chronic edema in the bases is question. Lungs elsewhere clear. Stable cardiac silhouette. No adenopathy evident. Aortic Atherosclerosis (ICD10-I70.0). Electronically Signed   By: Lowella Grip III M.D.   On: 01/06/2020 10:27   DG Foot Complete Right  Result Date: 01/06/2020 CLINICAL DATA:  Diabetic ulcer fifth metatarsal. EXAM: RIGHT FOOT COMPLETE - 3+ VIEW COMPARISON:  04/08/2019 FINDINGS: Soft tissue swelling noted over the lateral foot, along the fifth MTP joint and proximal little toe. Gas along the skin surface is compatible with an open wound. No underlying bony destruction to suggest osteomyelitis by x-ray. IMPRESSION: Findings consistent with reported diabetic wound involving the little toe. No underlying bony lucency or destruction to suggest osteomyelitis. Electronically Signed   By: Misty Stanley M.D.   On: 01/06/2020 10:43   VAS [place] LOWER EXTREMITY VENOUS (DVT) (ONLY [facility] & [facility])  Result Date: 01/06/2020  Lower Venous DVTStudy Indications: Swelling.  Limitations: Body habitus, poor ultrasound/tissue interface, hypersomnolence, and tissue distortion secondary to previous history of surgical intervention bilateral calves. Comparison Study: No prior study Performing Technologist: Maudry Mayhew MHA, RDMS, RVT, RDCS  Examination Guidelines: A complete evaluation includes B-mode imaging, spectral Doppler, color Doppler, and power Doppler as needed of all accessible portions of each vessel. Bilateral testing is considered an integral part of a complete examination. Limited examinations for reoccurring indications may be performed as noted. The reflux portion of the exam is performed with the patient in reverse Trendelenburg.  +---------+---------------+---------+-----------+----------+--------------+ RIGHT    CompressibilityPhasicitySpontaneityPropertiesThrombus Aging +---------+---------------+---------+-----------+----------+--------------+ CFV      Full           Yes       Yes                                 +---------+---------------+---------+-----------+----------+--------------+ SFJ      Full                                                        +---------+---------------+---------+-----------+----------+--------------+ FV Prox  Full                                                        +---------+---------------+---------+-----------+----------+--------------+ FV Mid   Full                                                        +---------+---------------+---------+-----------+----------+--------------+  FV DistalFull                                                        +---------+---------------+---------+-----------+----------+--------------+ PFV      Full                                                        +---------+---------------+---------+-----------+----------+--------------+ POP      Full           Yes      Yes                                 +---------+---------------+---------+-----------+----------+--------------+ PTV      Full                                                        +---------+---------------+---------+-----------+----------+--------------+ PERO     Full                                                        +---------+---------------+---------+-----------+----------+--------------+   Right Technical Findings: Not visualized segments include limited evaluation PTV/peroneal veins.  +---------+---------------+---------+-----------+----------+--------------+ LEFT     CompressibilityPhasicitySpontaneityPropertiesThrombus Aging +---------+---------------+---------+-----------+----------+--------------+ CFV      Full           Yes      Yes                                 +---------+---------------+---------+-----------+----------+--------------+ SFJ      Full                                                         +---------+---------------+---------+-----------+----------+--------------+ FV Prox  Full                                                        +---------+---------------+---------+-----------+----------+--------------+ FV Mid   Full                                                        +---------+---------------+---------+-----------+----------+--------------+ FV DistalFull                                                        +---------+---------------+---------+-----------+----------+--------------+  PFV      Full                                                        +---------+---------------+---------+-----------+----------+--------------+ POP      Full           Yes      Yes                                 +---------+---------------+---------+-----------+----------+--------------+ PTV      Full                                                        +---------+---------------+---------+-----------+----------+--------------+ PERO     Full                                                        +---------+---------------+---------+-----------+----------+--------------+   Left Technical Findings: Not visualized segments include Limited evaluation PTV/peroneal veins.   Summary: RIGHT: - There is no evidence of deep vein thrombosis in the lower extremity. However, portions of this examination were limited- see technologist comments above.  - No cystic structure found in the popliteal fossa.  LEFT: - There is no evidence of deep vein thrombosis in the lower extremity. However, portions of this examination were limited- see technologist comments above.  - No cystic structure found in the popliteal fossa.  *See table(s) above for measurements and observations.    Preliminary     Procedures Procedures (including critical care time)  Medications Ordered in ED Medications  cefTRIAXone (ROCEPHIN) injection 1 g (1 g Intramuscular Given 01/06/20 1247)  lidocaine (PF)  (XYLOCAINE) 1 % injection 5 mL (2.1 mg Other Given 01/06/20 1252)    ED Course  I have reviewed the triage vital signs and the nursing notes.  Pertinent labs & imaging results that were available during my care of the patient were reviewed by me and considered in my medical decision making (see chart for details).  64 year old presents for evaluation of bilateral lower extremity swelling however right greater than left.  Afebrile, nonseptic, non-ill-appearing.  Does have known diabetic ulcer to fifth metatarsal head followed by podiatry.  No surrounding erythema or warmth however does appear to have mildly grown in size when compared to podiatry note 10 days ago.  No active bleeding or drainage.  She does have some erythema and warmth to anterior right shin.  Does not extend into the foot or metatarsal heads.  2+ pitting edema to right lower extremity, 1+ pitting edema to left lower extremity.  Prior visits from cardiology and orthopedics reviewed and edema is consistent with prior visits.  She does have abrasion to left lateral calf left lower extremity however no active bleeding or drainage.  She is ambulatory.  Neurovascularly intact. She is not tachycardic, tachypnea or hypoxia.   She is not anticoagulated.  Denies any prior history of PE or DVT however does seem unsure about  this.  She is only on a baby aspirin, no additional anticoagulation.  She denies any chest pain, shortness of breath, hemoptysis.  No recent surgery (last 08/24/19), immobilization, malignancy.Plan on labs, imaging, reassess.   Labs and imaging personally reviewed and interpreted:  Plain film right foot without any evidence of osteomyelitis, gas forming organism Chest x-ray with some possible chronic pulmonary edema, patient denies chest pain, shortness of breath, no hypoxia here in ED EKG without ST changes CBC without leukocytosis, hemoglobin 10.9 at baseline Metabolic panel with mild hyponatremia to 130, hyperglycemia 130,  creatinine 1.30, improved from baseline Ultrasound negative for DVT bilaterally however overall difficult exam, low suspicion for emergent vascular occlusion, PE  Patient reassessed.  She is ambulatory to the restroom.  She continues without tachycardia, tachypnea or hypoxia.  Low suspicion for sepsis.  Exam consistent with possible cellulitis.  Was given Rocephin here in the emergency department.  Will start on doxycycline.  Patient follow-up with PCP in 2 days for reevaluation.  Wound was outlined.  Discussed bacitracin to abrasion to left calf.  Area does not resemble new ulceration.  She is neurovascularly intact.  Discussed with attending, Dr. Colin Mulders who agrees with above treatment, plan and  Disposition  The patient has been appropriately medically screened and/or stabilized in the ED. I have low suspicion for any other emergent medical condition which would require further screening, evaluation or treatment in the ED or require inpatient management.  Patient is hemodynamically stable and in no acute distress.  Patient able to ambulate in department prior to ED.  Evaluation does not show acute pathology that would require ongoing or additional emergent interventions while in the emergency department or further inpatient treatment.  I have discussed the diagnosis with the patient and answered all questions.  Pain is been managed while in the emergency department and patient has no further complaints prior to discharge.  Patient is comfortable with plan discussed in room and is stable for discharge at this time.  I have discussed strict return precautions for returning to the emergency department.  Patient was encouraged to follow-up with PCP/specialist refer to at discharge.     MDM Rules/Calculators/A&P                           Final Clinical Impression(s) / ED Diagnoses Final diagnoses:  Cellulitis of right lower extremity  History of peripheral vascular disease  Diabetic ulcer of right  fifth toe ([place])    Rx / DC Orders ED Discharge Orders         Ordered    doxycycline (VIBRAMYCIN) 100 MG capsule  2 times daily        01/06/20 1303           Keiarah Orlowski A, PA-C 01/06/20 1308    Sherwood Gambler, MD 01/10/20 0720

## 2020-01-06 NOTE — ED Notes (Signed)
Pt d/c home per MD order. Discharge summary reviewed with pt, pt verbalizes understanding. No s/s of acute distress noted. Reports discharge ride home.

## 2020-01-06 NOTE — Progress Notes (Signed)
Bilateral lower extremity venous duplex completed. Refer to "CV Proc" under chart review to view preliminary results.  01/06/2020 11:34 AM Kelby Aline., MHA, RVT, RDCS, RDMS

## 2020-01-06 NOTE — ED Notes (Signed)
Pt transported to [facility]

## 2020-01-07 ENCOUNTER — Other Ambulatory Visit (HOSPITAL_COMMUNITY)
Admission: RE | Admit: 2020-01-07 | Discharge: 2020-01-07 | Disposition: A | Discharge status: Home / Self Care | Payer: Medicare Other | Source: Ambulatory Visit | Attending: Cardiovascular Disease | Admitting: Cardiovascular Disease

## 2020-01-07 DIAGNOSIS — Z20822 Contact with and (suspected) exposure to covid-19: Secondary | ICD-10-CM | POA: Insufficient documentation

## 2020-01-07 DIAGNOSIS — Z01812 Encounter for preprocedural laboratory examination: Secondary | ICD-10-CM | POA: Diagnosis not present

## 2020-01-07 LAB — SARS CORONAVIRUS 2 (TAT 6-24 HRS): SARS Coronavirus 2: NEGATIVE

## 2020-01-10 ENCOUNTER — Telehealth: Payer: Self-pay | Admitting: *Deleted

## 2020-01-10 NOTE — Telephone Encounter (Signed)
Pt contacted pre-catheterization scheduled at [facility] for: Wednesday January 11, 2020 12:30 PM Verified arrival time and place: [facility]) at: 10:30 AM   No solid food after midnight prior to cath, clear liquids until 5 AM day of procedure.  Hold: Metformin-none day of procedure and 48 hours post procedure HCTZ-AM of procedure  Except hold medications AM meds can be  taken pre-cath with sips of water including: ASA 81 mg   Confirmed patient has responsible adult to drive home post procedure and be with patient first 24 hours after arriving home: yes  You are allowed ONE visitor in the waiting room during the time you are at the hospital for your procedure. Both you and your visitor must wear a mask once you enter the hospital.       COVID-19 Pre-Screening Questions:  . In the past 14 days have you had a new cough, new headache, new nasal congestion, fever (100.4 or greater) unexplained body aches, new sore throat, or sudden loss of taste or sense of smell? no . In the past 14 days have you been around anyone with known Covid 19? no . Have you been vaccinated for COVID-19? Yes, see immunization history  Reviewed procedure/mask/visitor instructions, COVID-19 questions with patient.  Per Dr Meliton Rattan does not need to go in early for pre procedure hydration.

## 2020-01-11 ENCOUNTER — Encounter (HOSPITAL_COMMUNITY)
Admission: RE | Disposition: A | Discharge status: Home / Self Care | Payer: Medicare Other | Source: Home / Self Care | Attending: Cardiovascular Disease

## 2020-01-11 ENCOUNTER — Other Ambulatory Visit: Payer: Self-pay

## 2020-01-11 ENCOUNTER — Ambulatory Visit (HOSPITAL_COMMUNITY)
Admission: RE | Admit: 2020-01-11 | Discharge: 2020-01-12 | Disposition: A | Discharge status: Home / Self Care | Payer: Medicare Other | Attending: Cardiovascular Disease | Admitting: Cardiovascular Disease

## 2020-01-11 DIAGNOSIS — E1151 Type 2 diabetes mellitus with diabetic peripheral angiopathy without gangrene: Secondary | ICD-10-CM | POA: Diagnosis not present

## 2020-01-11 DIAGNOSIS — F17210 Nicotine dependence, cigarettes, uncomplicated: Secondary | ICD-10-CM | POA: Diagnosis not present

## 2020-01-11 DIAGNOSIS — E11621 Type 2 diabetes mellitus with foot ulcer: Secondary | ICD-10-CM | POA: Diagnosis not present

## 2020-01-11 DIAGNOSIS — Z7984 Long term (current) use of oral hypoglycemic drugs: Secondary | ICD-10-CM | POA: Diagnosis not present

## 2020-01-11 DIAGNOSIS — Z7982 Long term (current) use of aspirin: Secondary | ICD-10-CM | POA: Diagnosis not present

## 2020-01-11 DIAGNOSIS — Z955 Presence of coronary angioplasty implant and graft: Secondary | ICD-10-CM | POA: Diagnosis not present

## 2020-01-11 DIAGNOSIS — J449 Chronic obstructive pulmonary disease, unspecified: Secondary | ICD-10-CM | POA: Diagnosis not present

## 2020-01-11 DIAGNOSIS — F419 Anxiety disorder, unspecified: Secondary | ICD-10-CM | POA: Insufficient documentation

## 2020-01-11 DIAGNOSIS — E1140 Type 2 diabetes mellitus with diabetic neuropathy, unspecified: Secondary | ICD-10-CM | POA: Diagnosis not present

## 2020-01-11 DIAGNOSIS — I129 Hypertensive chronic kidney disease with stage 1 through stage 4 chronic kidney disease, or unspecified chronic kidney disease: Secondary | ICD-10-CM | POA: Diagnosis not present

## 2020-01-11 DIAGNOSIS — I70235 Atherosclerosis of native arteries of right leg with ulceration of other part of foot: Secondary | ICD-10-CM | POA: Insufficient documentation

## 2020-01-11 DIAGNOSIS — M797 Fibromyalgia: Secondary | ICD-10-CM | POA: Insufficient documentation

## 2020-01-11 DIAGNOSIS — Z79899 Other long term (current) drug therapy: Secondary | ICD-10-CM | POA: Diagnosis not present

## 2020-01-11 DIAGNOSIS — Z888 Allergy status to other drugs, medicaments and biological substances status: Secondary | ICD-10-CM | POA: Insufficient documentation

## 2020-01-11 DIAGNOSIS — E785 Hyperlipidemia, unspecified: Secondary | ICD-10-CM | POA: Insufficient documentation

## 2020-01-11 DIAGNOSIS — I209 Angina pectoris, unspecified: Secondary | ICD-10-CM | POA: Diagnosis present

## 2020-01-11 DIAGNOSIS — L97519 Non-pressure chronic ulcer of other part of right foot with unspecified severity: Secondary | ICD-10-CM | POA: Insufficient documentation

## 2020-01-11 DIAGNOSIS — E1122 Type 2 diabetes mellitus with diabetic chronic kidney disease: Secondary | ICD-10-CM | POA: Insufficient documentation

## 2020-01-11 DIAGNOSIS — F329 Major depressive disorder, single episode, unspecified: Secondary | ICD-10-CM | POA: Insufficient documentation

## 2020-01-11 DIAGNOSIS — N183 Chronic kidney disease, stage 3 unspecified: Secondary | ICD-10-CM | POA: Diagnosis not present

## 2020-01-11 DIAGNOSIS — I25119 Atherosclerotic heart disease of native coronary artery with unspecified angina pectoris: Secondary | ICD-10-CM

## 2020-01-11 HISTORY — PX: LEFT HEART CATH AND CORONARY ANGIOGRAPHY: CATH118249

## 2020-01-11 LAB — POCT ACTIVATED CLOTTING TIME: Activated Clotting Time: 367 seconds

## 2020-01-11 LAB — GLUCOSE, CAPILLARY
Glucose-Capillary: 109 mg/dL — ABNORMAL HIGH (ref 70–99)
Glucose-Capillary: 118 mg/dL — ABNORMAL HIGH (ref 70–99)
Glucose-Capillary: 253 mg/dL — ABNORMAL HIGH (ref 70–99)

## 2020-01-11 SURGERY — LEFT HEART CATH AND CORONARY ANGIOGRAPHY
Anesthesia: LOCAL

## 2020-01-11 MED ORDER — SODIUM CHLORIDE 0.9% FLUSH
3.0000 mL | Freq: Two times a day (BID) | INTRAVENOUS | Status: DC
  Administered 2020-01-12: 3 mL via INTRAVENOUS

## 2020-01-11 MED ORDER — LABETALOL HCL 5 MG/ML IV SOLN: 10.0000 mg | INTRAVENOUS | Status: AC | PRN

## 2020-01-11 MED ORDER — HEPARIN (PORCINE) IN NACL 1000-0.9 UT/500ML-% IV SOLN
INTRAVENOUS | Status: AC
  Filled 2020-01-11: qty 1000

## 2020-01-11 MED ORDER — CLOPIDOGREL BISULFATE 300 MG PO TABS
ORAL_TABLET | ORAL | Status: DC | PRN
  Administered 2020-01-11: 600 mg via ORAL

## 2020-01-11 MED ORDER — ASPIRIN 81 MG PO CHEW: 81.0000 mg | CHEWABLE_TABLET | ORAL | Status: DC

## 2020-01-11 MED ORDER — SODIUM CHLORIDE 0.9% FLUSH: 3.0000 mL | INTRAVENOUS | Status: DC | PRN

## 2020-01-11 MED ORDER — INSULIN ASPART 100 UNIT/ML ~~LOC~~ SOLN: 0.0000 [IU] | Freq: Three times a day (TID) | SUBCUTANEOUS | Status: DC

## 2020-01-11 MED ORDER — ONDANSETRON HCL 4 MG/2ML IJ SOLN
INTRAMUSCULAR | Status: AC
  Filled 2020-01-11: qty 2

## 2020-01-11 MED ORDER — NICOTINE 14 MG/24HR TD PT24
14.0000 mg | MEDICATED_PATCH | Freq: Every day | TRANSDERMAL | Status: DC
  Filled 2020-01-11: qty 1

## 2020-01-11 MED ORDER — MIDAZOLAM HCL 2 MG/2ML IJ SOLN
INTRAMUSCULAR | Status: AC
  Filled 2020-01-11: qty 2

## 2020-01-11 MED ORDER — VERAPAMIL HCL 2.5 MG/ML IV SOLN
INTRAVENOUS | Status: AC
  Filled 2020-01-11: qty 2

## 2020-01-11 MED ORDER — VERAPAMIL HCL 2.5 MG/ML IV SOLN: INTRAVENOUS | Status: DC | PRN

## 2020-01-11 MED ORDER — ONDANSETRON HCL 4 MG/2ML IJ SOLN: 4.0000 mg | Freq: Four times a day (QID) | INTRAMUSCULAR | Status: DC | PRN

## 2020-01-11 MED ORDER — ACETAMINOPHEN 325 MG PO TABS: 650.0000 mg | ORAL_TABLET | ORAL | Status: DC | PRN

## 2020-01-11 MED ORDER — ADULT MULTIVITAMIN W/MINERALS CH
1.0000 | ORAL_TABLET | Freq: Every day | ORAL | Status: DC
  Filled 2020-01-11: qty 1

## 2020-01-11 MED ORDER — HYDROCHLOROTHIAZIDE 25 MG PO TABS
25.0000 mg | ORAL_TABLET | Freq: Every day | ORAL | Status: DC
  Administered 2020-01-11 – 2020-01-12 (×2): 25 mg via ORAL
  Filled 2020-01-11 (×2): qty 1

## 2020-01-11 MED ORDER — IOHEXOL 350 MG/ML SOLN
INTRAVENOUS | Status: DC | PRN
  Administered 2020-01-11: 120 mL

## 2020-01-11 MED ORDER — FENTANYL CITRATE (PF) 100 MCG/2ML IJ SOLN
INTRAMUSCULAR | Status: DC | PRN
Start: 2020-01-11 — End: 2020-01-11
  Administered 2020-01-11: 25 ug via INTRAVENOUS
  Administered 2020-01-11 (×2): 50 ug via INTRAVENOUS

## 2020-01-11 MED ORDER — DOXYCYCLINE HYCLATE 100 MG PO TABS
100.0000 mg | ORAL_TABLET | Freq: Two times a day (BID) | ORAL | Status: DC
  Administered 2020-01-11 – 2020-01-12 (×2): 100 mg via ORAL
  Filled 2020-01-11 (×2): qty 1

## 2020-01-11 MED ORDER — SODIUM CHLORIDE 0.9 % IV SOLN: 250.0000 mL | INTRAVENOUS | Status: DC | PRN

## 2020-01-11 MED ORDER — HEPARIN SODIUM (PORCINE) 1000 UNIT/ML IJ SOLN
INTRAMUSCULAR | Status: DC | PRN
  Administered 2020-01-11 (×2): 4000 [IU] via INTRAVENOUS

## 2020-01-11 MED ORDER — FAMOTIDINE IN NACL 20-0.9 MG/50ML-% IV SOLN
INTRAVENOUS | Status: AC | PRN
  Administered 2020-01-11: 20 mg via INTRAVENOUS

## 2020-01-11 MED ORDER — ONDANSETRON HCL 4 MG/2ML IJ SOLN
INTRAMUSCULAR | Status: DC | PRN
  Administered 2020-01-11: 4 mg via INTRAVENOUS

## 2020-01-11 MED ORDER — LIDOCAINE HCL (PF) 1 % IJ SOLN
INTRAMUSCULAR | Status: DC | PRN
  Administered 2020-01-11: 2 mL

## 2020-01-11 MED ORDER — ATORVASTATIN CALCIUM 40 MG PO TABS: 40.0000 mg | ORAL_TABLET | Freq: Every day | ORAL | Status: DC

## 2020-01-11 MED ORDER — CYCLOBENZAPRINE HCL 10 MG PO TABS: 5.0000 mg | ORAL_TABLET | Freq: Three times a day (TID) | ORAL | Status: DC | PRN

## 2020-01-11 MED ORDER — SODIUM CHLORIDE 0.9 % WEIGHT BASED INFUSION
1.0000 mL/kg/h | INTRAVENOUS | Status: AC
  Administered 2020-01-11: 1 mL/kg/h via INTRAVENOUS

## 2020-01-11 MED ORDER — LIDOCAINE HCL (PF) 1 % IJ SOLN
INTRAMUSCULAR | Status: AC
  Filled 2020-01-11: qty 30

## 2020-01-11 MED ORDER — DULOXETINE HCL 20 MG PO CPEP
20.0000 mg | ORAL_CAPSULE | Freq: Every day | ORAL | Status: DC
  Administered 2020-01-12: 20 mg via ORAL
  Filled 2020-01-11: qty 1

## 2020-01-11 MED ORDER — FENTANYL CITRATE (PF) 100 MCG/2ML IJ SOLN
INTRAMUSCULAR | Status: AC
  Filled 2020-01-11: qty 2

## 2020-01-11 MED ORDER — MIDAZOLAM HCL 2 MG/2ML IJ SOLN
INTRAMUSCULAR | Status: DC | PRN
  Administered 2020-01-11 (×2): 1 mg via INTRAVENOUS

## 2020-01-11 MED ORDER — HEPARIN (PORCINE) IN NACL 1000-0.9 UT/500ML-% IV SOLN
INTRAVENOUS | Status: DC | PRN
  Administered 2020-01-11 (×2): 500 mL

## 2020-01-11 MED ORDER — SODIUM CHLORIDE 0.9 % WEIGHT BASED INFUSION: 1.0000 mL/kg/h | INTRAVENOUS | Status: DC

## 2020-01-11 MED ORDER — FLUTICASONE PROPIONATE 50 MCG/ACT NA SUSP
1.0000 | Freq: Every day | NASAL | Status: DC
  Administered 2020-01-12: 1 via NASAL
  Filled 2020-01-11: qty 16

## 2020-01-11 MED ORDER — CLOPIDOGREL BISULFATE 300 MG PO TABS
ORAL_TABLET | ORAL | Status: AC
  Filled 2020-01-11: qty 2

## 2020-01-11 MED ORDER — HEPARIN SODIUM (PORCINE) 1000 UNIT/ML IJ SOLN
INTRAMUSCULAR | Status: AC
  Filled 2020-01-11: qty 1

## 2020-01-11 MED ORDER — FAMOTIDINE IN NACL 20-0.9 MG/50ML-% IV SOLN
INTRAVENOUS | Status: AC
  Filled 2020-01-11: qty 50

## 2020-01-11 MED ORDER — CLOPIDOGREL BISULFATE 75 MG PO TABS
75.0000 mg | ORAL_TABLET | Freq: Every day | ORAL | Status: DC
  Administered 2020-01-12: 75 mg via ORAL
  Filled 2020-01-11: qty 1

## 2020-01-11 MED ORDER — SODIUM CHLORIDE 0.9% FLUSH
3.0000 mL | Freq: Two times a day (BID) | INTRAVENOUS | Status: DC
  Administered 2020-01-11: 3 mL via INTRAVENOUS

## 2020-01-11 MED ORDER — ALBUTEROL SULFATE HFA 108 (90 BASE) MCG/ACT IN AERS
2.0000 | INHALATION_SPRAY | Freq: Four times a day (QID) | RESPIRATORY_TRACT | Status: DC | PRN
  Filled 2020-01-11: qty 6.7

## 2020-01-11 MED ORDER — ASPIRIN EC 81 MG PO TBEC
81.0000 mg | DELAYED_RELEASE_TABLET | Freq: Every day | ORAL | Status: DC
  Administered 2020-01-12: 81 mg via ORAL
  Filled 2020-01-11: qty 1

## 2020-01-11 MED ORDER — ATENOLOL 25 MG PO TABS
25.0000 mg | ORAL_TABLET | Freq: Every day | ORAL | Status: DC
  Administered 2020-01-12: 25 mg via ORAL
  Filled 2020-01-11: qty 1

## 2020-01-11 MED ORDER — METHADONE HCL 10 MG PO TABS
126.0000 mg | ORAL_TABLET | Freq: Every day | ORAL | Status: DC
  Administered 2020-01-11: 126 mg via ORAL
  Filled 2020-01-11: qty 13

## 2020-01-11 MED ORDER — SODIUM CHLORIDE 0.9 % WEIGHT BASED INFUSION
3.0000 mL/kg/h | INTRAVENOUS | Status: DC
  Administered 2020-01-11: 3 mL/kg/h via INTRAVENOUS

## 2020-01-11 MED ORDER — BUPROPION HCL 75 MG PO TABS
75.0000 mg | ORAL_TABLET | Freq: Every day | ORAL | Status: DC
  Administered 2020-01-12: 75 mg via ORAL
  Filled 2020-01-11: qty 1

## 2020-01-11 SURGICAL SUPPLY — 16 items
BALLN SAPPHIRE ~~LOC~~ 3.25X15 (BALLOONS) ×1 IMPLANT
CATH INFINITI 5FR JK (CATHETERS) ×1 IMPLANT
CATH LAUNCHER 6FR EBU3.5 (CATHETERS) ×1 IMPLANT
CATH SHOCKWAVE 3.0X12 (CATHETERS) IMPLANT
CATHETER SHOCKWAVE 3.0X12 (CATHETERS) ×2
DEVICE RAD COMP TR BAND LRG (VASCULAR PRODUCTS) ×1 IMPLANT
GLIDESHEATH SLEND SS 6F .021 (SHEATH) ×1 IMPLANT
GUIDEWIRE INQWIRE 1.5J.035X260 (WIRE) IMPLANT
INQWIRE 1.5J .035X260CM (WIRE) ×2
KIT ENCORE 26 ADVANTAGE (KITS) ×1 IMPLANT
KIT HEART LEFT (KITS) ×2 IMPLANT
PACK CARDIAC CATHETERIZATION (CUSTOM PROCEDURE TRAY) ×2 IMPLANT
STENT RESOLUTE ONYX 3.0X18 (Permanent Stent) ×1 IMPLANT
TRANSDUCER W/STOPCOCK (MISCELLANEOUS) ×2 IMPLANT
TUBING CIL FLEX 10 FLL-RA (TUBING) ×2 IMPLANT
WIRE RUNTHROUGH .014X180CM (WIRE) ×1 IMPLANT

## 2020-01-11 NOTE — Interval H&P Note (Signed)
Cath Lab Visit (complete for each Cath Lab visit)  Clinical Evaluation Leading to the Procedure:   ACS: No.  Non-ACS:    Anginal Classification: CCS III  Anti-ischemic medical therapy: Minimal Therapy (1 class of medications)  Non-Invasive Test Results: Intermediate-risk stress test findings: cardiac mortality 1-3%/year  Prior CABG: No previous CABG      History and Physical Interval Note:  01/11/2020 2:01 PM  Rose [name]  has presented today for surgery, with the diagnosis of abnormal stress test.  The various methods of treatment have been discussed with the patient and family. After consideration of risks, benefits and other options for treatment, the patient has consented to  Procedure(s): LEFT HEART CATH AND CORONARY ANGIOGRAPHY (N/A) as a surgical intervention.  The patient's history has been reviewed, patient examined, no change in status, stable for surgery.  I have reviewed the patient's chart and labs.  Questions were answered to the patient's satisfaction.     Rose [name]

## 2020-01-12 ENCOUNTER — Other Ambulatory Visit (HOSPITAL_COMMUNITY): Payer: Self-pay | Admitting: Physician Assistant

## 2020-01-12 ENCOUNTER — Encounter (HOSPITAL_COMMUNITY): Payer: Self-pay | Admitting: Cardiovascular Disease

## 2020-01-12 DIAGNOSIS — Z7984 Long term (current) use of oral hypoglycemic drugs: Secondary | ICD-10-CM | POA: Diagnosis not present

## 2020-01-12 DIAGNOSIS — I209 Angina pectoris, unspecified: Secondary | ICD-10-CM

## 2020-01-12 DIAGNOSIS — E1140 Type 2 diabetes mellitus with diabetic neuropathy, unspecified: Secondary | ICD-10-CM | POA: Diagnosis not present

## 2020-01-12 DIAGNOSIS — L97519 Non-pressure chronic ulcer of other part of right foot with unspecified severity: Secondary | ICD-10-CM | POA: Diagnosis not present

## 2020-01-12 DIAGNOSIS — E1151 Type 2 diabetes mellitus with diabetic peripheral angiopathy without gangrene: Secondary | ICD-10-CM | POA: Diagnosis not present

## 2020-01-12 DIAGNOSIS — Z955 Presence of coronary angioplasty implant and graft: Secondary | ICD-10-CM | POA: Diagnosis not present

## 2020-01-12 DIAGNOSIS — Z79899 Other long term (current) drug therapy: Secondary | ICD-10-CM | POA: Diagnosis not present

## 2020-01-12 DIAGNOSIS — E11621 Type 2 diabetes mellitus with foot ulcer: Secondary | ICD-10-CM | POA: Diagnosis not present

## 2020-01-12 DIAGNOSIS — E785 Hyperlipidemia, unspecified: Secondary | ICD-10-CM | POA: Diagnosis not present

## 2020-01-12 DIAGNOSIS — F17210 Nicotine dependence, cigarettes, uncomplicated: Secondary | ICD-10-CM | POA: Diagnosis not present

## 2020-01-12 DIAGNOSIS — Z7982 Long term (current) use of aspirin: Secondary | ICD-10-CM | POA: Diagnosis not present

## 2020-01-12 DIAGNOSIS — I25119 Atherosclerotic heart disease of native coronary artery with unspecified angina pectoris: Secondary | ICD-10-CM | POA: Diagnosis not present

## 2020-01-12 DIAGNOSIS — I70235 Atherosclerosis of native arteries of right leg with ulceration of other part of foot: Secondary | ICD-10-CM | POA: Diagnosis not present

## 2020-01-12 DIAGNOSIS — N183 Chronic kidney disease, stage 3 unspecified: Secondary | ICD-10-CM | POA: Diagnosis not present

## 2020-01-12 DIAGNOSIS — Z888 Allergy status to other drugs, medicaments and biological substances status: Secondary | ICD-10-CM | POA: Diagnosis not present

## 2020-01-12 DIAGNOSIS — M797 Fibromyalgia: Secondary | ICD-10-CM | POA: Diagnosis not present

## 2020-01-12 DIAGNOSIS — E1122 Type 2 diabetes mellitus with diabetic chronic kidney disease: Secondary | ICD-10-CM | POA: Diagnosis not present

## 2020-01-12 DIAGNOSIS — J449 Chronic obstructive pulmonary disease, unspecified: Secondary | ICD-10-CM | POA: Diagnosis not present

## 2020-01-12 DIAGNOSIS — I129 Hypertensive chronic kidney disease with stage 1 through stage 4 chronic kidney disease, or unspecified chronic kidney disease: Secondary | ICD-10-CM | POA: Diagnosis not present

## 2020-01-12 LAB — CBC
HCT: 34.1 % — ABNORMAL LOW (ref 36.0–46.0)
Hemoglobin: 10.9 g/dL — ABNORMAL LOW (ref 12.0–15.0)
MCH: 26.5 pg (ref 26.0–34.0)
MCHC: 32 g/dL (ref 30.0–36.0)
MCV: 83 fL (ref 80.0–100.0)
Platelets: 222 10*3/uL (ref 150–400)
RBC: 4.11 MIL/uL (ref 3.87–5.11)
RDW: 12.9 % (ref 11.5–15.5)
WBC: 9.5 10*3/uL (ref 4.0–10.5)
nRBC: 0 % (ref 0.0–0.2)

## 2020-01-12 LAB — GLUCOSE, CAPILLARY: Glucose-Capillary: 92 mg/dL (ref 70–99)

## 2020-01-12 LAB — BASIC METABOLIC PANEL
Anion gap: 10 (ref 5–15)
BUN: 16 mg/dL (ref 8–23)
CO2: 26 mmol/L (ref 22–32)
Calcium: 8.8 mg/dL — ABNORMAL LOW (ref 8.9–10.3)
Chloride: 98 mmol/L (ref 98–111)
Creatinine, Ser: 1.39 mg/dL — ABNORMAL HIGH (ref 0.44–1.00)
GFR, Estimated: 41 mL/min — ABNORMAL LOW (ref >60)
Glucose, Bld: 86 mg/dL (ref 70–99)
Potassium: 3.8 mmol/L (ref 3.5–5.1)
Sodium: 134 mmol/L — ABNORMAL LOW (ref 135–145)

## 2020-01-12 MED ORDER — ATORVASTATIN CALCIUM 80 MG PO TABS
80.0000 mg | ORAL_TABLET | Freq: Every day | ORAL | 11 refills | Status: DC
Start: 2020-01-12 — End: 2022-03-03

## 2020-01-12 MED ORDER — ATORVASTATIN CALCIUM 80 MG PO TABS
80.0000 mg | ORAL_TABLET | Freq: Every day | ORAL | Status: DC
  Administered 2020-01-12: 80 mg via ORAL
  Filled 2020-01-12: qty 1

## 2020-01-12 MED ORDER — CLOPIDOGREL BISULFATE 75 MG PO TABS
75.0000 mg | ORAL_TABLET | Freq: Every day | ORAL | 5 refills | Status: DC
Start: 2020-01-12 — End: 2020-03-16

## 2020-01-12 MED FILL — CLOPIDOGREL 75 MG TABLET: 75 | 30 days supply | Qty: 30 | Fill #0

## 2020-01-12 NOTE — Progress Notes (Signed)
CARDIAC REHAB PHASE I   PRE:  Rate/Rhythm: 80 SR    BP: sitting 109/53    SaO2:   MODE:  Ambulation: 210 ft   POST:  Rate/Rhythm: 112 ST    BP: sitting 138/74     SaO2:   Pt ambulated with cane, slow and steady. Limited by orthopedic issues. VSS, HR slightly elevated. No angina or SOB. Discussed stent, restrictions, Plavix, smoking cessation, diet, exercise, and CRPII. Pt voiced understanding. Will refer to [place].  She is working on smoking cessation with patches.  [phone]  [place], ACSM 01/12/2020 10:20 AM

## 2020-01-12 NOTE — Discharge Summary (Addendum)
Discharge Summary    Patient ID: Rose [name] MRN: [id number]; DOB: 1956/02/28  Admit date: 01/11/2020 Discharge date: 01/12/2020  Primary Care Provider: [name], MD  Primary Cardiologist: [name], MD    Discharge Diagnoses    Active Problems:   Angina pectoris ([place])   CAD   HTN  HLD  DM  PAD   Diagnostic Studies/Procedures    LEFT HEART CATH AND CORONARY ANGIOGRAPHY  Conclusion    The left ventricular systolic function is normal.  LV end diastolic pressure is mildly elevated.  The left ventricular ejection fraction is 55-65% by visual estimate.  Prox LAD to Mid LAD lesion is 90% stenosed.  Post intervention, there is a 0% residual stenosis.  A drug-eluting stent was successfully placed using a STENT RESOLUTE ONYX 3.0X18.  Prox Cx to Mid Cx lesion is 60% stenosed.   1.  Severe one-vessel coronary artery disease with 90% heavily calcified stenosis in the mid LAD.  Moderate proximal to mid left circumflex disease.  The left circumflex is large and codominant vessel. 2.  Normal LV systolic function mildly elevated left ventricular end-diastolic pressure. 3.  Successful intravascular lithotripsy and drug eluting stent placement to the mid LAD.  Recommendations: Dual antiplatelet therapy for at least 6 months. Aggressive treatment of risk factors. Please note that sedating the patient was extremely difficult and the patient was moving during the whole case.     History of Present Illness     Rose [name] is a 64 y.o. female with history of hypertension, hyperlipidemia, peripheral arterial disease with nonhealing ulcer on the lateral aspect of right foot s/p bilateral common iliac artery stent and ongoing tobacco use presents for outpatient cardiac catheterization.  Patient was last seen by Dr. Fletcher Anon 12/20/2019. Complaint of chest pain. Abnormal EKG. follow-up stress test was intermediate risk study with findings consistent with  prior infarction and cardiac catheterization arranged for further evaluation.  Hospital Course     Consultants: None   Cardiac cath showed severe one-vessel CAD with 90% heavily calcified mid LAD lesion s/p successful intravascular lithotripsy and drug-eluting stent placement. 60% proximal circumflex lesion treated medically. Hard time sedating patient during procedure. No complication. Recommended dual antiplatelet therapy with aspirin and Plavix for at least 6 months. Renal function stable. No bleeding complication. Ambulated well. Increase statin to high intensity. Consider lipid panel as outpatient.  The patient been seen by Dr. Maisie Fus today and deemed ready for discharge home. All follow-up appointments have been scheduled. Discharge medications are listed below.   Did the patient have an acute coronary syndrome (MI, NSTEMI, STEMI, etc) this admission?:  No                               Did the patient have a percutaneous coronary intervention (stent / angioplasty)?:  Yes.     Cath/PCI Registry Performance & Quality Measures: 1. Aspirin prescribed? - Yes 2. ADP Receptor Inhibitor (Plavix/Clopidogrel, Brilinta/Ticagrelor or Effient/Prasugrel) prescribed (includes medically managed patients)? - Yes 3. High Intensity Statin (Lipitor 40-80mg  or Crestor 20-40mg ) prescribed? - Yes 4. For EF <40%, was ACEI/ARB prescribed? - Not Applicable (EF >/= 03%) 5. For EF <40%, Aldosterone Antagonist (Spironolactone or Eplerenone) prescribed? - Not Applicable (EF >/= 00%) 6. Cardiac Rehab Phase II ordered? - Yes   _____________  Discharge Vitals Blood pressure 127/63, pulse 75, temperature 97.7 F (36.5 C), temperature source Oral, resp. rate 14, height 5\' 3"  (1.6 m),  weight 73.9 kg, last menstrual period 07/31/2011, SpO2 92 %.  Filed Weights   01/11/20 1022 01/12/20 0437  Weight: 77.1 kg 73.9 kg   Physical Exam Constitutional:      Appearance: Normal appearance.  HENT:     Head:  Normocephalic.     Nose: Nose normal.  Eyes:     Extraocular Movements: Extraocular movements intact.     Pupils: Pupils are equal, round, and reactive to light.  Cardiovascular:     Rate and Rhythm: Normal rate and regular rhythm.     Comments: Right radial cath site without hematoma  Pulmonary:     Effort: Pulmonary effort is normal.     Breath sounds: Normal breath sounds.  Abdominal:     General: Abdomen is flat.     Palpations: Abdomen is soft.  Musculoskeletal:        General: Normal range of motion.     Cervical back: Normal range of motion.  Skin:    General: Skin is warm and dry.  Neurological:     General: No focal deficit present.     Mental Status: She is alert and oriented to person, place, and time.  Psychiatric:        Mood and Affect: Mood normal.        Behavior: Behavior normal.     Labs & Radiologic Studies    CBC Recent Labs    01/12/20 0158  WBC 9.5  HGB 10.9*  HCT 34.1*  MCV 83.0  PLT 453   Basic Metabolic Panel Recent Labs    01/12/20 0158  NA 134*  K 3.8  CL 98  CO2 26  GLUCOSE 86  BUN 16  CREATININE 1.39*  CALCIUM 8.8*  _____________  DG Chest 2 View  Result Date: 01/06/2020 CLINICAL DATA:  Lower extremity edema EXAM: CHEST - 2 VIEW COMPARISON:  October 26, 2018 FINDINGS: Shallow degree of inspiration again noted. Interstitial thickening in the lung bases. Lungs elsewhere clear. Heart size and pulmonary vascularity are normal. No adenopathy. There is aortic atherosclerosis. No bone lesions. IMPRESSION: Shallow degree of inspiration. Interstitial thickening in the lung bases. Mild chronic edema in the bases is question. Lungs elsewhere clear. Stable cardiac silhouette. No adenopathy evident. Aortic Atherosclerosis (ICD10-I70.0). Electronically Signed   By: Lowella Grip III M.D.   On: 01/06/2020 10:27   CARDIAC CATHETERIZATION  Result Date: 01/11/2020  The left ventricular systolic function is normal.  LV end diastolic pressure is  mildly elevated.  The left ventricular ejection fraction is 55-65% by visual estimate.  Prox LAD to Mid LAD lesion is 90% stenosed.  Post intervention, there is a 0% residual stenosis.  A drug-eluting stent was successfully placed using a STENT RESOLUTE ONYX 3.0X18.  Prox Cx to Mid Cx lesion is 60% stenosed.  1.  Severe one-vessel coronary artery disease with 90% heavily calcified stenosis in the mid LAD.  Moderate proximal to mid left circumflex disease.  The left circumflex is large and codominant vessel. 2.  Normal LV systolic function mildly elevated left ventricular end-diastolic pressure. 3.  Successful intravascular lithotripsy and drug eluting stent placement to the mid LAD. Recommendations: Dual antiplatelet therapy for at least 6 months. Aggressive treatment of risk factors. Please note that sedating the patient was extremely difficult and the patient was moving during the whole case.   DG Foot Complete Right  Result Date: 01/06/2020 CLINICAL DATA:  Diabetic ulcer fifth metatarsal. EXAM: RIGHT FOOT COMPLETE - 3+ VIEW COMPARISON:  04/08/2019 FINDINGS:  Soft tissue swelling noted over the lateral foot, along the fifth MTP joint and proximal little toe. Gas along the skin surface is compatible with an open wound. No underlying bony destruction to suggest osteomyelitis by x-ray. IMPRESSION: Findings consistent with reported diabetic wound involving the little toe. No underlying bony lucency or destruction to suggest osteomyelitis. Electronically Signed   By: Misty Stanley M.D.   On: 01/06/2020 10:43   MYOCARDIAL PERFUSION IMAGING  Result Date: 12/29/2019  The left ventricular ejection fraction is normal (55-65%).  Nuclear stress EF: 65%.  There was no ST segment deviation noted during stress.  No T wave inversion was noted during stress.  Findings consistent with prior myocardial infarction.  This is an intermediate risk study.  1. There is a fixed, moderate to severe perfusion defect present  in the apical anterior and apical segments that is medium size (8-10% of the LV).  The wall motion in this region is normal.  This could represent breast attenuation.  However unable to exclude prior infarction.  No reversible defects to suggest ischemia. 2. Normal LVEF, 65%. 3. This is an intermediate risk study.   VAS [place] LOWER EXTREMITY VENOUS (DVT) (ONLY [facility] & [facility])  Result Date: 01/06/2020  Lower Venous DVTStudy Indications: Swelling.  Limitations: Body habitus, poor ultrasound/tissue interface, hypersomnolence, and tissue distortion secondary to previous history of surgical intervention bilateral calves. Comparison Study: No prior study Performing Technologist: Maudry Mayhew MHA, RDMS, RVT, RDCS  Examination Guidelines: A complete evaluation includes B-mode imaging, spectral Doppler, color Doppler, and power Doppler as needed of all accessible portions of each vessel. Bilateral testing is considered an integral part of a complete examination. Limited examinations for reoccurring indications may be performed as noted. The reflux portion of the exam is performed with the patient in reverse Trendelenburg.  +---------+---------------+---------+-----------+----------+--------------+ RIGHT    CompressibilityPhasicitySpontaneityPropertiesThrombus Aging +---------+---------------+---------+-----------+----------+--------------+ CFV      Full           Yes      Yes                                 +---------+---------------+---------+-----------+----------+--------------+ SFJ      Full                                                        +---------+---------------+---------+-----------+----------+--------------+ FV Prox  Full                                                        +---------+---------------+---------+-----------+----------+--------------+ FV Mid   Full                                                         +---------+---------------+---------+-----------+----------+--------------+ FV DistalFull                                                        +---------+---------------+---------+-----------+----------+--------------+  PFV      Full                                                        +---------+---------------+---------+-----------+----------+--------------+ POP      Full           Yes      Yes                                 +---------+---------------+---------+-----------+----------+--------------+ PTV      Full                                                        +---------+---------------+---------+-----------+----------+--------------+ PERO     Full                                                        +---------+---------------+---------+-----------+----------+--------------+   Right Technical Findings: Not visualized segments include limited evaluation PTV/peroneal veins.  +---------+---------------+---------+-----------+----------+--------------+ LEFT     CompressibilityPhasicitySpontaneityPropertiesThrombus Aging +---------+---------------+---------+-----------+----------+--------------+ CFV      Full           Yes      Yes                                 +---------+---------------+---------+-----------+----------+--------------+ SFJ      Full                                                        +---------+---------------+---------+-----------+----------+--------------+ FV Prox  Full                                                        +---------+---------------+---------+-----------+----------+--------------+ FV Mid   Full                                                        +---------+---------------+---------+-----------+----------+--------------+ FV DistalFull                                                        +---------+---------------+---------+-----------+----------+--------------+ PFV      Full                                                         +---------+---------------+---------+-----------+----------+--------------+  POP      Full           Yes      Yes                                 +---------+---------------+---------+-----------+----------+--------------+ PTV      Full                                                        +---------+---------------+---------+-----------+----------+--------------+ PERO     Full                                                        +---------+---------------+---------+-----------+----------+--------------+   Left Technical Findings: Not visualized segments include Limited evaluation PTV/peroneal veins.   Summary: RIGHT: - There is no evidence of deep vein thrombosis in the lower extremity. However, portions of this examination were limited- see technologist comments above.  - No cystic structure found in the popliteal fossa.  LEFT: - There is no evidence of deep vein thrombosis in the lower extremity. However, portions of this examination were limited- see technologist comments above.  - No cystic structure found in the popliteal fossa.  *See table(s) above for measurements and observations. Electronically signed by Servando Snare MD on 01/06/2020 at 5:34:01 PM.    Final    Disposition   Pt is being discharged home today in good condition.  Follow-up Plans & Appointments     Follow-up Information    Wellington Hampshire, MD. Go on 02/14/2020.   Specialty: Cardiology Why: @10 :40am for cath follow up  Contact information: [address] [place] [address] [phone]              Discharge Instructions    AMB Referral to Cardiac Rehabilitation - Phase II   Complete by: As directed    Diagnosis:  Stable Angina Coronary Stents     After initial evaluation and assessments completed: Virtual Based Care may be provided alone or in conjunction with Phase 2 Cardiac Rehab based on patient barriers.: Yes   Diet - low  sodium heart healthy   Complete by: As directed    Discharge instructions   Complete by: As directed    No driving for 48 hours No lifting over 5 lbs for 1 week. No sexual activity for 1 week. Keep procedure site clean & dry. If you notice increased pain, swelling, bleeding or pus, call/return!  You may shower, but no soaking baths/hot tubs/pools for 1 week.   Hold metformin for 2 days. Restart Saturday on 01/14/20.   Increase activity slowly   Complete by: As directed       Discharge Medications   Allergies as of 01/12/2020      Reactions   Lisinopril Swelling   Angioedema   Gabapentin Hives, Other (See Comments)   Only when takes over 300 mg dose. "gives me the shakes"   Vicodin [hydrocodone-acetaminophen] Hives, Diarrhea, Other (See Comments)   Stomach  cramps      Medication List    TAKE these medications   albuterol 108 (90 Base) MCG/ACT  inhaler Commonly known as: VENTOLIN HFA Inhale 2 puffs into the lungs every 6 (six) hours as needed. For wheezing What changed:   reasons to take this  additional instructions   aspirin EC 81 MG tablet Take 1 tablet (81 mg total) by mouth daily.   atenolol 50 MG tablet Commonly known as: TENORMIN TAKE 1/2 TABLET(25 MG) BY MOUTH DAILY What changed: See the new instructions.   atorvastatin 80 MG tablet Commonly known as: LIPITOR Take 1 tablet (80 mg total) by mouth daily. What changed:   medication strength  See the new instructions.   buPROPion 75 MG tablet Commonly known as: WELLBUTRIN Take 75 mg by mouth daily.   cetirizine 10 MG tablet Commonly known as: ZYRTEC TAKE 1 TABLET BY MOUTH DAILY   clopidogrel 75 MG tablet Commonly known as: PLAVIX Take 1 tablet (75 mg total) by mouth daily with breakfast.   cyclobenzaprine 5 MG tablet Commonly known as: FLEXERIL Take 5 mg by mouth 3 (three) times daily as needed for muscle spasms.   doxycycline 100 MG capsule Commonly known as: VIBRAMYCIN Take 1 capsule (100 mg  total) by mouth 2 (two) times daily.   DULoxetine 20 MG capsule Commonly known as: CYMBALTA Take 1 capsule (20 mg total) by mouth daily.   fluticasone 50 MCG/ACT nasal spray Commonly known as: FLONASE SHAKE LIQUID AND USE 1 SPRAY IN EACH NOSTRIL DAILY What changed:   how much to take  how to take this  when to take this  additional instructions   hydrochlorothiazide 25 MG tablet Commonly known as: HYDRODIURIL TAKE 1 TABLET(25 MG) BY MOUTH DAILY What changed:   how much to take  how to take this  when to take this  additional instructions   lidocaine 5 % ointment Commonly known as: XYLOCAINE Apply 1 application topically as needed. What changed:   when to take this  reasons to take this   metFORMIN 500 MG tablet Commonly known as: GLUCOPHAGE Take 1 tablet (500 mg total) by mouth 2 (two) times daily with a meal.   methadone 10 MG/5ML solution Commonly known as: DOLOPHINE Take 126 mg by mouth daily.   nicotine 14 mg/24hr patch Commonly known as: NICODERM CQ - dosed in mg/24 hours Medicaid [id number] L Wks 1-6: 14 mg x 1 patch daily. Wear for 24 hours. If you have sleep disturbances, remove at bedtime. What changed:   how much to take  how to take this  when to take this   One-A-Day Womens 50+ Advantage Tabs Take 1 tablet by mouth daily.   Santyl ointment Generic drug: collagenase Apply 1 application topically daily. On foot   silver sulfADIAZINE 1 % cream Commonly known as: Silvadene Apply pea-sized amount to wound daily. What changed:   how much to take  how to take this  when to take this          Outstanding Labs/Studies   Lipid panel and LFTs  Duration of Discharge Encounter   Greater than 30 minutes including physician time.  SignedLeanor Kail, PA 01/12/2020, 8:53 AM   I have personally seen and examined this patient. I agree with the assessment and plan as outlined above.  She is doing well this am post PCI. Will  plan to discharge home today on ASA, Plavix, beta blocker and statin.   Lauree Chandler 01/12/2020 9:47 AM

## 2020-01-13 LAB — HEMOGLOBIN A1C
Hgb A1c MFr Bld: 7.6 % — ABNORMAL HIGH (ref 4.8–5.6)
Mean Plasma Glucose: 171 mg/dL

## 2020-01-16 ENCOUNTER — Other Ambulatory Visit: Payer: Self-pay

## 2020-01-16 ENCOUNTER — Ambulatory Visit (INDEPENDENT_AMBULATORY_CARE_PROVIDER_SITE_OTHER): Payer: Medicare Other | Admitting: Internal Medicine

## 2020-01-16 VITALS — BP 148/66 | HR 74 | Temp 98.4°F | Wt 167.9 lb

## 2020-01-16 DIAGNOSIS — Z23 Encounter for immunization: Secondary | ICD-10-CM | POA: Diagnosis not present

## 2020-01-16 DIAGNOSIS — F17200 Nicotine dependence, unspecified, uncomplicated: Secondary | ICD-10-CM

## 2020-01-16 DIAGNOSIS — I25118 Atherosclerotic heart disease of native coronary artery with other forms of angina pectoris: Secondary | ICD-10-CM | POA: Diagnosis not present

## 2020-01-16 DIAGNOSIS — E1142 Type 2 diabetes mellitus with diabetic polyneuropathy: Secondary | ICD-10-CM | POA: Diagnosis not present

## 2020-01-16 DIAGNOSIS — I10 Essential (primary) hypertension: Secondary | ICD-10-CM

## 2020-01-16 DIAGNOSIS — E1169 Type 2 diabetes mellitus with other specified complication: Secondary | ICD-10-CM

## 2020-01-16 DIAGNOSIS — Z794 Long term (current) use of insulin: Secondary | ICD-10-CM

## 2020-01-16 DIAGNOSIS — E11621 Type 2 diabetes mellitus with foot ulcer: Secondary | ICD-10-CM

## 2020-01-16 DIAGNOSIS — F17210 Nicotine dependence, cigarettes, uncomplicated: Secondary | ICD-10-CM | POA: Diagnosis not present

## 2020-01-16 DIAGNOSIS — L97519 Non-pressure chronic ulcer of other part of right foot with unspecified severity: Secondary | ICD-10-CM | POA: Diagnosis not present

## 2020-01-16 DIAGNOSIS — E118 Type 2 diabetes mellitus with unspecified complications: Secondary | ICD-10-CM

## 2020-01-16 DIAGNOSIS — Z7984 Long term (current) use of oral hypoglycemic drugs: Secondary | ICD-10-CM

## 2020-01-16 MED ORDER — METFORMIN HCL 500 MG PO TABS: ORAL_TABLET | ORAL | 3 refills | Status: DC

## 2020-01-16 MED ORDER — CARVEDILOL 6.25 MG PO TABS: 6.2500 mg | ORAL_TABLET | Freq: Two times a day (BID) | ORAL | 11 refills | Status: DC

## 2020-01-16 MED ORDER — DULOXETINE HCL 20 MG PO CPEP: 60.0000 mg | ORAL_CAPSULE | Freq: Every day | ORAL | 2 refills | Status: DC

## 2020-01-16 NOTE — Patient Instructions (Signed)
Thank you, Ms.Marcele [name] for allowing [place] to provide your care today. Today we discussed heart disease, diabetes, blood pressure.    I have ordered the following labs for you:  Lab Orders  No laboratory test(s) ordered today     I will call if any are abnormal. All of your labs can be accessed through "My Chart".  Referrals and tests ordered today:   Referral Orders  No referral(s) requested today     I have ordered the following medication/changed the following medications:   Medications Discontinued During This Encounter  Medication Reason  . doxycycline (VIBRAMYCIN) 100 MG capsule   . DULoxetine (CYMBALTA) 20 MG capsule Reorder  . atenolol (TENORMIN) 50 MG tablet Change in therapy  . metFORMIN (GLUCOPHAGE) 500 MG tablet      Meds ordered this encounter  Medications  . DULoxetine (CYMBALTA) 20 MG capsule    Sig: Take 3 capsules (60 mg total) by mouth daily.    Dispense:  90 capsule    Refill:  2  . carvedilol (COREG) 6.25 MG tablet    Sig: Take 1 tablet (6.25 mg total) by mouth 2 (two) times daily.    Dispense:  60 tablet    Refill:  11  . metFORMIN (GLUCOPHAGE) 500 MG tablet    Sig: Take 2 tablets (1,000 mg total) by mouth daily with breakfast AND 1 tablet (500 mg total) every evening.    Dispense:  180 tablet    Refill:  3    I will look into starting an SGLT 2 inhibitor in the near future.  Follow up: 3 months    Remember:   Should you have any questions or concerns please call the internal medicine clinic at [phone].     [name], D.O. [place]

## 2020-01-16 NOTE — Progress Notes (Signed)
CC: Diabetes mellitus  HPI:  Rose [name] is a 64 y.o. female with a past medical history stated below and presents today for diabetes mellitus. Please see problem based assessment and plan for additional details.  Past Medical History:  Diagnosis Date  . ACE inhibitor-aggravated angioedema, initial encounter 04/26/2016  . Allergic rhinitis   . Anxiety   . Arthritis   . Calculus of gallbladder without cholecystitis without obstruction 10/28/2018  . Chronic back pain   . CKD (chronic kidney disease), stage III ([place])    folllowed by pcp  . Coronary artery disease   . Depression   . Diabetic neuropathy ([name])   . Edema of lower extremity 10/28/2018  . Encounter for completion of form with patient 06/21/2019  . Environmental allergies   . Environmental allergies 12/28/2015  . Fibromyalgia   . Frequency of urination   . History of chronic bronchitis   . History of drug abuse ([name]) states quit herion in 2006  . History of hepatitis C last abd ultrasound in epic 11-10-2018;  last hepative panel in epic 11-30-2018 was normal   previously followed by [facility] (notes in care everywhere)-- dx 04/ 2015,  started harvonic treatment 07/ 2015  . History of rib fracture 10/28/2018  . HPV in female 06/11/2016  . Hypertension    followed by pcp  (nuclear stress test 01-15-2017 in epic,  showed low risk normal w/ nuclear ef 55%)  . Laceration of right foot 03/04/2019  . Lipoma of neck 07/16/2017   Seen by general surgery Dr Marlou Starks May 16 - plan for removal of 2cm sebaceous cyst.   . Methadone maintenance therapy patient ([place])   . Mild asthma    followed by pcp  . Nocturia   . PAD (peripheral artery disease) ([place])    followed by cardiology, dr Fletcher Anon---  06-01-2019  s/p bilateral CIA angioplasty stenting  . Right foot ulcer ([name])   . Tendonitis    bilateral wrist  . Type 2 diabetes mellitus ([name])    followed by pcp   (06-16-2019  per pt checks blood sugar every  other day in am,  fasting sugar 92-95)  . Urge urinary incontinence     Current Outpatient Medications on File Prior to Visit  Medication Sig Dispense Refill  . albuterol (PROVENTIL HFA;VENTOLIN HFA) 108 (90 Base) MCG/ACT inhaler Inhale 2 puffs into the lungs every 6 (six) hours as needed. For wheezing (Patient taking differently: Inhale 2 puffs into the lungs every 6 (six) hours as needed for shortness of breath. ) 1 Inhaler 2  . aspirin EC 81 MG tablet Take 1 tablet (81 mg total) by mouth daily.    [name] atorvastatin (LIPITOR) 80 MG tablet Take 1 tablet (80 mg total) by mouth daily. 30 tablet 11  . buPROPion (WELLBUTRIN) 75 MG tablet Take 75 mg by mouth daily.    . cetirizine (ZYRTEC) 10 MG tablet TAKE 1 TABLET BY MOUTH DAILY (Patient taking differently: Take 10 mg by mouth daily. ) 90 tablet 1  . clopidogrel (PLAVIX) 75 MG tablet Take 1 tablet (75 mg total) by mouth daily with breakfast. 30 tablet 5  . cyclobenzaprine (FLEXERIL) 5 MG tablet Take 5 mg by mouth 3 (three) times daily as needed for muscle spasms.    . fluticasone (FLONASE) 50 MCG/ACT nasal spray SHAKE LIQUID AND USE 1 SPRAY IN EACH NOSTRIL DAILY (Patient taking differently: Place 1 spray into both nostrils daily. Shake well) 16 g 5  .  hydrochlorothiazide (HYDRODIURIL) 25 MG tablet TAKE 1 TABLET(25 MG) BY MOUTH DAILY (Patient taking differently: Take 25 mg by mouth daily. ) 90 tablet 1  . lidocaine (XYLOCAINE) 5 % ointment Apply 1 application topically as needed. (Patient taking differently: Apply 1 application topically daily as needed for mild pain (on leg). ) 50 g 0  . methadone (DOLOPHINE) 10 MG/5ML solution Take 126 mg by mouth daily.     . Multiple Vitamins-Minerals (ONE-A-DAY WOMENS 50+ ADVANTAGE) TABS Take 1 tablet by mouth daily.     . nicotine (NICODERM CQ - DOSED IN MG/24 HOURS) 14 mg/24hr patch Medicaid [id number] L Wks 1-6: 14 mg x 1 patch daily. Wear for 24 hours. If you have sleep disturbances, remove at bedtime. (Patient  taking differently: Place 14 mg onto the skin See admin instructions. Medicaid [id number] L Wks 1-6: 14 mg x 1 patch daily. Wear for 24 hours. If you have sleep disturbances, remove at bedtime.) 30 patch 2  . SANTYL ointment Apply 1 application topically daily. On foot    . silver sulfADIAZINE (SILVADENE) 1 % cream Apply pea-sized amount to wound daily. (Patient taking differently: Apply 1 application topically See admin instructions. Apply pea-sized amount to wound daily.) 50 g 0   No current facility-administered medications on file prior to visit.    Family History  Problem Relation Age of Onset  . Hypertension Mother   . Diabetes type II Mother   . Heart attack Mother 106  . CAD Father   . Heart attack Father 10  . Hypertension Brother   . Diabetes type II Brother   . Heart attack Brother 40  . Diabetes type II Sister     Social History   Socioeconomic History  . Marital status: Single    Spouse name: Not on file  . Number of children: Not on file  . Years of education: Not on file  . Highest education level: Not on file  Occupational History  . Not on file  Tobacco Use  . Smoking status: Current Every Day Smoker    Packs/day: 0.50    Years: 20.00    Pack years: 10.00    Types: Cigarettes  . Smokeless tobacco: Never Used  Vaping Use  . Vaping Use: Never used  Substance and Sexual Activity  . Alcohol use: No    Alcohol/week: 0.0 standard drinks  . Drug use: Not Currently    Comment: hx heroin use, IV,  last used 2006 (currently on methadone)  . Sexual activity: Not Currently  Other Topics Concern  . Not on file  Social History Narrative  . Not on file   Social Determinants of Health   Financial Resource Strain:   . Difficulty of Paying Living Expenses: Not on file  Food Insecurity:   . Worried About Charity fundraiser in the Last Year: Not on file  . Ran Out of Food in the Last Year: Not on file  Transportation Needs:   . Lack of Transportation (Medical):  Not on file  . Lack of Transportation (Non-Medical): Not on file  Physical Activity:   . Days of Exercise per Week: Not on file  . Minutes of Exercise per Session: Not on file  Stress:   . Feeling of Stress : Not on file  Social Connections:   . Frequency of Communication with Friends and Family: Not on file  . Frequency of Social Gatherings with Friends and Family: Not on file  . Attends Religious Services: Not on file  .  Active Member of Clubs or Organizations: Not on file  . Attends Archivist Meetings: Not on file  . Marital Status: Not on file  Intimate Partner Violence:   . Fear of Current or Ex-Partner: Not on file  . Emotionally Abused: Not on file  . Physically Abused: Not on file  . Sexually Abused: Not on file    Review of Systems: ROS negative except for what is noted on the assessment and plan.  Vitals:   01/16/20 0948  BP: (!) 148/66  Pulse: 74  Temp: 98.4 F (36.9 C)  TempSrc: Oral  SpO2: 100%  Weight: 167 lb 14.4 oz (76.2 kg)     Physical Exam: Physical Exam Constitutional:      Appearance: Normal appearance.  HENT:     Head: Normocephalic and atraumatic.  Cardiovascular:     Rate and Rhythm: Normal rate and regular rhythm.     Pulses: Normal pulses.     Heart sounds: Normal heart sounds.  Pulmonary:     Effort: Pulmonary effort is normal. No respiratory distress.     Breath sounds: Normal breath sounds. No wheezing or rales.  Chest:     Chest wall: No tenderness.  Abdominal:     General: There is no distension.     Palpations: Abdomen is soft.  Musculoskeletal:        General: Swelling (1+ edema of lower extremities bilaterally) present. No tenderness. Normal range of motion.  Skin:    General: Skin is warm and dry.     Comments: Diabetic ulcer of the right foot at the base of her toe.  Neurological:     General: No focal deficit present.     Mental Status: She is alert and oriented to person, place, and time.      Assessment  & Plan:   See Encounters Tab for problem based charting.  Patient discussed with Dr. Lars Mage, D.O. [facility], PGY-2 Pager: [phone], Phone: [phone] Date 01/17/2020 Time 7:11 AM

## 2020-01-17 ENCOUNTER — Ambulatory Visit (HOSPITAL_COMMUNITY)
Admission: RE | Admit: 2020-01-17 | Discharge: 2020-01-17 | Disposition: A | Discharge status: Home / Self Care | Payer: Medicare Other | Source: Ambulatory Visit | Attending: Cardiovascular Disease | Admitting: Cardiovascular Disease

## 2020-01-17 ENCOUNTER — Encounter: Payer: Self-pay | Admitting: Internal Medicine

## 2020-01-17 ENCOUNTER — Ambulatory Visit (HOSPITAL_BASED_OUTPATIENT_CLINIC_OR_DEPARTMENT_OTHER)
Admission: RE | Admit: 2020-01-17 | Discharge: 2020-01-17 | Disposition: A | Discharge status: Home / Self Care | Payer: Medicare Other | Source: Ambulatory Visit | Attending: Cardiovascular Disease | Admitting: Cardiovascular Disease

## 2020-01-17 DIAGNOSIS — Z95828 Presence of other vascular implants and grafts: Secondary | ICD-10-CM | POA: Insufficient documentation

## 2020-01-17 DIAGNOSIS — I2510 Atherosclerotic heart disease of native coronary artery without angina pectoris: Secondary | ICD-10-CM | POA: Insufficient documentation

## 2020-01-17 NOTE — Assessment & Plan Note (Signed)
Of hypertension.  Her blood pressure today is 148/66.  She is currently taking hydrochlorothiazide 25 mg daily.  She admits to good adherence to this medication without side effects.  Further blood pressure management is likely limited to low diastolic pressures.  We will continue to follow along closely.  BMP from this month shows good kidney function with normal electrolytes.

## 2020-01-17 NOTE — Assessment & Plan Note (Addendum)
Patient presents for further evaluation and management of her DM. Her DM is controlled on Metformin 500 mg daily. She admits to being compliant with her diabetes medications. She admits to  foot ulcerations and paresthesia of the feet and thickened nails. She has identified the following barriers to her DM management: none. Patient has not seen Debera Lat. She has had 0 hospitalizations recently for DM.   Last eye exam was last year with doctor Kathlen Mody and showed diabetic retinopathy.  Last foot exam was today and showed warm, good capillary refill, normal DP and PT pulses, reduced sensation at the ball of her feet bilaterally and ulceration at the right foot at the base of her toe.  Currently seeing Dr. March Rummage for podiatry. Patient has had the following complications as a result of their DM: nephropathy, retinopathy, peripheral neuropathy, cardiovascular disease and peripheral vascular disease Patients current random blood sugar was   Glucose-Capillary  Date/Time Value Ref Range Status  01/12/2020 08:01 AM 92 70 - 99 mg/dL Final    Comment:    Glucose reference range applies only to samples taken after fasting for at least 8 hours.    Last A1c was  Lab Results  Component Value Date   HGBA1C 7.6 (H) 01/12/2020  .    Plan: Discussed foot care. Reviewed medications with patient and discussed importance of medication adherence  Target Range blood sugar: 100-120 and target range for A1c: < 8  We identified the following goals during this appointment: foot care I counseled the patient regarding lifestyle modifications including moderate-intensity aerobic exercise for 30-60 minutes most days of the week in combination with dieting (DASH/Mediterranean) to achieve a sustained weight loss of 5-7%. I also recommended supplemental Diabetes education and weight management with Debera Lat.   Continue Metformin 500 mg daily.  Encourage follow-up with podiatry.

## 2020-01-17 NOTE — Assessment & Plan Note (Addendum)
Patient has a history of coronary artery disease with stable angina.  She is now status post left heart cath with DES placement in the mid LAD.  Currently on aspirin 81 and Plavix 75 mg and tolerating it well.  She is on high intensity statin with atorvastatin 80 mg and tolerating it well.  Patient states that her chest pain and exercise tolerance has improved since the procedure.   Plan: -Continue aspirin and Plavix for 6 months -Follow-up with cardiology for further evaluation and management of CAD. -Continue high intensity statin of atorvastatin 80 mg -We will switch patient to carvedilol to 5 mg twice daily and discontinue atenolol.

## 2020-01-17 NOTE — Assessment & Plan Note (Addendum)
Patient states that she smokes 2 cigarettes per day, but has a history of smoking 1 packs/day for 30 years (30 pack-year history). She has been in the action stage of quitting prior to today's appointment. She is whiling to quit smoking in the next 30-60 days. She  believes her barriers to quitting are the following: under a lot of stress now.  Through shared decision-making we set a smoking/tobacco cessation date of December 1st 2021  Plan: I advised patient to quit, and offered support. Discussed current use pattern. Asked patient to inform me when they set a quit date. Wellbutrin: denies h/o seizures or eating disorders. Nicotine patches beginning at 14 mg.

## 2020-01-18 NOTE — Progress Notes (Signed)
Internal Medicine Clinic Attending  Case discussed with Dr. Coe  At the time of the visit.  We reviewed the resident's history and exam and pertinent patient test results.  I agree with the assessment, diagnosis, and plan of care documented in the resident's note.  

## 2020-01-23 ENCOUNTER — Encounter: Payer: Self-pay | Admitting: *Deleted

## 2020-01-26 ENCOUNTER — Encounter: Payer: Self-pay | Admitting: Physical Medicine and Rehabilitation

## 2020-01-26 ENCOUNTER — Ambulatory Visit: Payer: Self-pay

## 2020-01-26 ENCOUNTER — Ambulatory Visit (INDEPENDENT_AMBULATORY_CARE_PROVIDER_SITE_OTHER): Payer: Medicare Other | Admitting: Physical Medicine and Rehabilitation

## 2020-01-26 ENCOUNTER — Other Ambulatory Visit: Payer: Self-pay

## 2020-01-26 VITALS — BP 132/78 | HR 88

## 2020-01-26 DIAGNOSIS — M47816 Spondylosis without myelopathy or radiculopathy, lumbar region: Secondary | ICD-10-CM | POA: Diagnosis not present

## 2020-01-26 MED ORDER — METHYLPREDNISOLONE ACETATE 80 MG/ML IJ SUSP
80.0000 mg | Freq: Once | INTRAMUSCULAR | Status: AC
  Administered 2020-01-26: 11:00:00 80 mg

## 2020-01-26 NOTE — Progress Notes (Signed)
Pt state lower back pain mostly on the right. Pt state walking and standing for a long period of time makes the pain worse. Pt state she use heating pads to helps ease.   Numeric Pain Rating Scale and Functional Assessment Average Pain 0   In the last MONTH (on 0-10 scale) has pain interfered with the following?  1. General activity like being  able to carry out your everyday physical activities such as walking, climbing stairs, carrying groceries, or moving a chair?  Rating(6)   +Driver, +BT, -Dye Allergies.

## 2020-01-27 ENCOUNTER — Telehealth: Payer: Self-pay | Admitting: Cardiovascular Disease

## 2020-01-27 NOTE — Telephone Encounter (Signed)
Pt c/o medication issue:  1. Name of Medication: clopidogrel (PLAVIX) 75 MG tablet aspirin EC 81 MG tablet 2. How are you currently taking this medication (dosage and times per day)?  As directed   3. Are you having a reaction (difficulty breathing--STAT)?  No reaction   4. What is your medication issue?   Patient wants to make sure that she is taking these two medications correctly. She would like to check if she should still be taking them both or if she was supposed to stop the aspirin when she started taking the clopidogrel.   Please call/advise.   Thank you!

## 2020-01-27 NOTE — Telephone Encounter (Signed)
Called patient and advised she should continue dual antiplatelet therapy for at least 6 months after 01/11/20 cath

## 2020-01-31 ENCOUNTER — Telehealth (HOSPITAL_COMMUNITY): Payer: Self-pay

## 2020-01-31 NOTE — Telephone Encounter (Signed)
Pt insurance is active and benefits verified through [facility] Medicare Co-pay 0, DED 0/0 met, out of pocket $7,550/$74.04 met, co-insurance 20%. no pre-authorization required. Passport, 01/31/2020_0 :14am, REF# [id number]  Pt insurance is active through Medicaid. Ref# [id number]  Will fax over [facility] Reimbursement form to Dr. Fletcher Anon   Will contact patient to see if she is interested in the Cardiac Rehab Program. If interested, patient will need to complete follow up appt. Once completed, patient will be contacted for scheduling upon review by the RN Navigator.

## 2020-02-07 ENCOUNTER — Ambulatory Visit: Payer: Medicare Other | Admitting: Podiatry

## 2020-02-14 ENCOUNTER — Ambulatory Visit (INDEPENDENT_AMBULATORY_CARE_PROVIDER_SITE_OTHER): Payer: Medicare Other | Admitting: Cardiovascular Disease

## 2020-02-14 ENCOUNTER — Encounter: Payer: Self-pay | Admitting: Cardiovascular Disease

## 2020-02-14 ENCOUNTER — Other Ambulatory Visit: Payer: Self-pay

## 2020-02-14 VITALS — BP 144/75 | HR 76 | Temp 96.1°F | Ht 63.0 in | Wt 165.0 lb

## 2020-02-14 DIAGNOSIS — E785 Hyperlipidemia, unspecified: Secondary | ICD-10-CM

## 2020-02-14 DIAGNOSIS — I10 Essential (primary) hypertension: Secondary | ICD-10-CM

## 2020-02-14 DIAGNOSIS — Z9889 Other specified postprocedural states: Secondary | ICD-10-CM

## 2020-02-14 DIAGNOSIS — Z720 Tobacco use: Secondary | ICD-10-CM

## 2020-02-14 DIAGNOSIS — I2510 Atherosclerotic heart disease of native coronary artery without angina pectoris: Secondary | ICD-10-CM | POA: Diagnosis not present

## 2020-02-14 DIAGNOSIS — I739 Peripheral vascular disease, unspecified: Secondary | ICD-10-CM | POA: Diagnosis not present

## 2020-02-14 NOTE — Patient Instructions (Signed)
Medication Instructions:  No changes *If you need a refill on your cardiac medications before your next appointment, please call your pharmacy*   Lab Work: None ordered If you have labs (blood work) drawn today and your tests are completely normal, you will receive your results only by: [name] MyChart Message (if you have MyChart) OR . A paper copy in the mail If you have any lab test that is abnormal or we need to change your treatment, we will call you to review the results.   Testing/Procedures: None ordered   Follow-Up: At [facility], you and your health needs are our priority.  As part of our continuing mission to provide you with exceptional heart care, we have created designated Provider Care Teams.  These Care Teams include your primary Cardiologist (physician) and Advanced Practice Providers (APPs -  Physician Assistants and Nurse Practitioners) who all work together to provide you with the care you need, when you need it.  We recommend signing up for the patient portal called "MyChart".  Sign up information is provided on this After Visit Summary.  MyChart is used to connect with patients for Virtual Visits (Telemedicine).  Patients are able to view lab/test results, encounter notes, upcoming appointments, etc.  Non-urgent messages can be sent to your provider as well.   To learn more about what you can do with MyChart, go to [email or web address].    Your next appointment:   6 month(s)  The format for your next appointment:   In Person  Provider:   [name], MD   Other Instructions None

## 2020-02-14 NOTE — Progress Notes (Signed)
Cardiology Office Note   Date:  02/14/2020   ID:  Albert, Devaul [birth year], MRN [id number]  PCP:  [name], MD  Cardiologist:   [name], MD   No chief complaint on file.     History of Present Illness: Rose [name] is a 64 y.o. female who is here today for follow-up visit regarding peripheral arterial disease and coronary artery disease.  The patient has multiple chronic medical conditions that include type 2 diabetes, essential hypertension, hyperlipidemia, remote IV drug use and tobacco use. She is followed for peripheral arterial disease with bilateral common iliac artery stenting done in March 2021.  She had no significant infrainguinal disease at that time.  She had an ulceration on the right lateral foot at that time.  Postprocedure ABI in April was normal with normal velocities in the iliac stents by duplex. She was seen in September for exertional chest pain.  She underwent a Lexiscan Myoview which showed evidence of anterior and anterolateral ischemia.  I proceeded with left heart catheterization in October which showed severe one-vessel coronary artery disease with 90% heavily calcified stenosis in the mid LAD and moderate left circumflex disease.  Ejection fraction was normal with mildly elevated left ventricular end-diastolic pressure.  I performed successful intravascular lithotripsy and drug eluting stent placement to the mid LAD.  She reports resolution of chest pain since then and she has been doing well.  No claudication.  She takes her medications regularly.  She is down on smoking few cigarettes a day but has not been able to quit.   Past Medical History:  Diagnosis Date  . ACE inhibitor-aggravated angioedema, initial encounter 04/26/2016  . Allergic rhinitis   . Anxiety   . Arthritis   . Calculus of gallbladder without cholecystitis without obstruction 10/28/2018  . Chronic back pain   . CKD (chronic kidney disease), stage III  ([place])    folllowed by pcp  . Coronary artery disease   . Depression   . Diabetic neuropathy ([place])   . Edema of lower extremity 10/28/2018  . Encounter for completion of form with patient 06/21/2019  . Environmental allergies   . Environmental allergies 12/28/2015  . Fibromyalgia   . Frequency of urination   . History of chronic bronchitis   . History of drug abuse ([place]) states quit herion in 2006  . History of hepatitis C last abd ultrasound in epic 11-10-2018;  last hepative panel in epic 11-30-2018 was normal   previously followed by [facility] (notes in care everywhere)-- dx 04/ 2015,  started harvonic treatment 07/ 2015  . History of rib fracture 10/28/2018  . HPV in female 06/11/2016  . Hypertension    followed by pcp  (nuclear stress test 01-15-2017 in epic,  showed low risk normal w/ nuclear ef 55%)  . Laceration of right foot 03/04/2019  . Lipoma of neck 07/16/2017   Seen by general surgery Dr Marlou Starks May 16 - plan for removal of 2cm sebaceous cyst.   . Methadone maintenance therapy patient ([place])   . Mild asthma    followed by pcp  . Nocturia   . PAD (peripheral artery disease) ([place])    followed by cardiology, dr Fletcher Anon---  06-01-2019  s/p bilateral CIA angioplasty stenting  . Right foot ulcer ([place])   . Tendonitis    bilateral wrist  . Type 2 diabetes mellitus ([place])    followed by pcp   (06-16-2019  per pt checks blood sugar  every other day in am,  fasting sugar 92-95)  . Urge urinary incontinence     Past Surgical History:  Procedure Laterality Date  . ABDOMINAL AORTOGRAM W/LOWER EXTREMITY Bilateral 06/01/2019   Procedure: ABDOMINAL AORTOGRAM W/LOWER EXTREMITY;  Surgeon: Wellington Hampshire, MD;  Location: [facility];  Service: Cardiovascular;  Laterality: Bilateral;  . APPENDECTOMY  1972  . INTERSTIM IMPLANT PLACEMENT  10/23/2011   Procedure: Barrie Lyme IMPLANT FIRST STAGE;  Surgeon: Reece Packer, MD;  Location: [facility];  Service:  Urology;  Laterality: N/A;  . INTERSTIM IMPLANT PLACEMENT  10/23/2011   Procedure: Barrie Lyme IMPLANT SECOND STAGE;  Surgeon: Reece Packer, MD;  Location: [facility];  Service: Urology;  Laterality: N/A;  . LEFT HEART CATH AND CORONARY ANGIOGRAPHY N/A 01/11/2020   Procedure: LEFT HEART CATH AND CORONARY ANGIOGRAPHY;  Surgeon: Wellington Hampshire, MD;  Location: [facility];  Service: Cardiovascular;  Laterality: N/A;  . PANACOS TISSUE GRAFT Right 06/24/2019   Procedure: Application of Skin Graft Substitute;  Surgeon: Evelina Bucy, DPM;  Location: [facility];  Service: Podiatry;  Laterality: Right;  . PERIPHERAL VASCULAR INTERVENTION Bilateral 06/01/2019   Procedure: PERIPHERAL VASCULAR INTERVENTION;  Surgeon: Wellington Hampshire, MD;  Location: [place] [facility];  Service: Cardiovascular;  Laterality: Bilateral;  . WOUND DEBRIDEMENT Right 06/24/2019   Procedure: DEBRIDEMENT WOUND;  Surgeon: Evelina Bucy, DPM;  Location: [facility];  Service: Podiatry;  Laterality: Right;  . WOUND DEBRIDEMENT Right 08/24/2019   Procedure: DEBRIDEMENT RIGHT FOOT WOUND; APPLICATION OF SKIN GRAFT SUBSTITUTE;  Surgeon: Evelina Bucy, DPM;  Location: [place];  Service: Podiatry;  Laterality: Right;     Current Outpatient Medications  Medication Sig Dispense Refill  . albuterol (PROVENTIL HFA;VENTOLIN HFA) 108 (90 Base) MCG/ACT inhaler Inhale 2 puffs into the lungs every 6 (six) hours as needed. For wheezing (Patient taking differently: Inhale 2 puffs into the lungs every 6 (six) hours as needed for shortness of breath. ) 1 Inhaler 2  . aspirin EC 81 MG tablet Take 1 tablet (81 mg total) by mouth daily.    [name] atorvastatin (LIPITOR) 80 MG tablet Take 1 tablet (80 mg total) by mouth daily. 30 tablet 11  . buPROPion (WELLBUTRIN) 75 MG tablet Take 75 mg by mouth daily.    . carvedilol (COREG) 6.25 MG tablet Take 1 tablet (6.25 mg total) by  mouth 2 (two) times daily. 60 tablet 11  . cetirizine (ZYRTEC) 10 MG tablet TAKE 1 TABLET BY MOUTH DAILY (Patient taking differently: Take 10 mg by mouth daily. ) 90 tablet 1  . clopidogrel (PLAVIX) 75 MG tablet Take 1 tablet (75 mg total) by mouth daily with breakfast. 30 tablet 5  . cyclobenzaprine (FLEXERIL) 5 MG tablet Take 5 mg by mouth 3 (three) times daily as needed for muscle spasms.    . DULoxetine (CYMBALTA) 20 MG capsule Take 3 capsules (60 mg total) by mouth daily. 90 capsule 2  . fluticasone (FLONASE) 50 MCG/ACT nasal spray SHAKE LIQUID AND USE 1 SPRAY IN EACH NOSTRIL DAILY (Patient taking differently: Place 1 spray into both nostrils daily. Shake well) 16 g 5  . hydrochlorothiazide (HYDRODIURIL) 25 MG tablet TAKE 1 TABLET(25 MG) BY MOUTH DAILY (Patient taking differently: Take 25 mg by mouth daily. ) 90 tablet 1  . lidocaine (XYLOCAINE) 5 % ointment Apply 1 application topically as needed. (Patient taking differently: Apply 1 application topically daily as  needed for mild pain (on leg). ) 50 g 0  . metFORMIN (GLUCOPHAGE) 500 MG tablet Take 2 tablets (1,000 mg total) by mouth daily with breakfast AND 1 tablet (500 mg total) every evening. 180 tablet 3  . methadone (DOLOPHINE) 10 MG/5ML solution Take 126 mg by mouth daily.     . Multiple Vitamins-Minerals (ONE-A-DAY WOMENS 50+ ADVANTAGE) TABS Take 1 tablet by mouth daily.     . nicotine (NICODERM CQ - DOSED IN MG/24 HOURS) 14 mg/24hr patch Medicaid [id number] L Wks 1-6: 14 mg x 1 patch daily. Wear for 24 hours. If you have sleep disturbances, remove at bedtime. (Patient taking differently: Place 14 mg onto the skin See admin instructions. Medicaid [id number] L Wks 1-6: 14 mg x 1 patch daily. Wear for 24 hours. If you have sleep disturbances, remove at bedtime.) 30 patch 2  . SANTYL ointment Apply 1 application topically daily. On foot    . silver sulfADIAZINE (SILVADENE) 1 % cream Apply pea-sized amount to wound daily. (Patient taking  differently: Apply 1 application topically See admin instructions. Apply pea-sized amount to wound daily.) 50 g 0   Current Facility-Administered Medications  Medication Dose Route Frequency Provider Last Rate Last Admin  . methylPREDNISolone acetate (DEPO-MEDROL) injection 80 mg  80 mg Other Once Magnus Sinning, MD        Allergies:   Lisinopril, Gabapentin, and Vicodin [hydrocodone-acetaminophen]    Social History:  The patient  reports that she has been smoking cigarettes. She has a 10.00 pack-year smoking history. She has never used smokeless tobacco. She reports previous drug use. She reports that she does not drink alcohol.   Family History:  The patient's family history includes CAD in her father; Diabetes type II in her brother, mother, and sister; Heart attack (age of onset: 54) in her brother; Heart attack (age of onset: 28) in her father and mother; Hypertension in her brother and mother.    ROS:  Please see the history of present illness.   Otherwise, review of systems are positive for none.   All other systems are reviewed and negative.    PHYSICAL EXAM: VS:  BP (!) 144/75   Pulse 76   Temp (!) 96.1 F (35.6 C)   Ht 5\' 3"  (1.6 m)   Wt 165 lb (74.8 kg)   LMP 07/31/2011   SpO2 98%   BMI 29.23 kg/m  , BMI Body mass index is 29.23 kg/m. GEN: Well nourished, well developed, in no acute distress  HEENT: normal  Neck: no JVD, carotid bruits, or masses Cardiac: RRR; no murmurs, rubs, or gallops, mild bilateral leg edema Respiratory:  clear to auscultation bilaterally, normal work of breathing GI: soft, nontender, nondistended, + BS MS: no deformity or atrophy  Skin: warm and dry, no rash Neuro:  Strength and sensation are intact Psych: euthymic mood, full affect Vascular: Posterior tibial pulses palpable bilaterally. Right radial pulses normal with no hematoma.  EKG:  EKG  Is  ordered today. EKG showed normal sinus rhythm with no significant ST or T wave  changes.  Recent Labs: 09/15/2019: TSH 4.270 01/12/2020: BUN 16; Creatinine, Ser 1.39; Hemoglobin 10.9; Platelets 222; Potassium 3.8; Sodium 134    Lipid Panel    Component Value Date/Time   CHOL 83 (L) 11/30/2018 0912   TRIG 109 11/30/2018 0912   HDL 31 (L) 11/30/2018 0912   CHOLHDL 2.7 11/30/2018 0912   LDLCALC 32 11/30/2018 0912      Wt Readings from Last 3 Encounters:  02/14/20 165 lb (74.8 kg)  01/16/20 167 lb 14.4 oz (76.2 kg)  01/12/20 163 lb (73.9 kg)        PAD Screen 12/19/2016  Previous PAD dx? No  Previous surgical procedure? No  Pain with walking? No  Feet/toe relief with dangling? Yes  Painful, non-healing ulcers? No  Extremities discolored? No      ASSESSMENT AND PLAN:  1.  Coronary artery disease involving native coronary arteries without angina she is doing very well after recent PCI and drug-eluting stent placement to the mid LAD.  Continue dual antiplatelet therapy for a minimum of 6 months but I will likely keep her on long-term dual antiplatelet therapy given the presence of peripheral arterial disease as well.  She has chronic back pain and likely not able to do cardiac rehab.  2. Peripheral arterial disease nonhealing ulceration on the lateral aspect of the right foot: Status post  stent placement to bilateral common iliac arteries.  No significant infrainguinal disease.  Recent vascular studies showed normal ABI and patent iliac stents.  3.  Tobacco use: She is down to 2 cigarettes a day and continues to use a nicotine patch.  4.  Hyperlipidemia: Continue treatment with atorvastatin with a target LDL of less than 70.  Most recent lipid profile in September of last year showed an LDL of 32.  5.  Essential hypertension: Blood pressure is controlled.    Disposition:   FU with me in 6 months  Signed,  [name], MD  02/14/2020 11:09 AM    [place]

## 2020-02-17 ENCOUNTER — Other Ambulatory Visit: Payer: Self-pay

## 2020-02-17 ENCOUNTER — Ambulatory Visit (INDEPENDENT_AMBULATORY_CARE_PROVIDER_SITE_OTHER): Payer: Medicare Other | Admitting: Podiatry

## 2020-02-17 DIAGNOSIS — E08621 Diabetes mellitus due to underlying condition with foot ulcer: Secondary | ICD-10-CM | POA: Diagnosis not present

## 2020-02-17 DIAGNOSIS — L97511 Non-pressure chronic ulcer of other part of right foot limited to breakdown of skin: Secondary | ICD-10-CM

## 2020-02-20 ENCOUNTER — Other Ambulatory Visit: Payer: Self-pay

## 2020-02-20 MED ORDER — CYCLOBENZAPRINE HCL 5 MG PO TABS: 5.0000 mg | ORAL_TABLET | Freq: Three times a day (TID) | ORAL | 0 refills | Status: DC | PRN

## 2020-02-20 NOTE — Telephone Encounter (Signed)
cyclobenzaprine (FLEXERIL) 5 MG tablet, REFILL REQUEST @  [id number] - [place], [address] AT [place] Phone:  [phone]  Fax:  [phone]

## 2020-02-28 NOTE — Progress Notes (Signed)
  Subjective:  Patient ID: Rose [name], female    DOB: March 09, 1956,  MRN: [id number]  Chief Complaint  Patient presents with  . Wound Check    right lateral foot. Denies nausea, vomiting, fever or chills.    64 y.o. female presents for wound care. Hx confirmed with patient. Objective:  Physical Exam: Wound Location: right lateral 5th. Appears epithelialized today. No warmth erythema signs of acute infection. Mild hyperkeratosis.  Assessment:   1. Diabetic ulcer of other part of right foot associated with diabetes mellitus due to underlying condition, limited to breakdown of skin [facility])    Plan:  Patient was evaluated and treated and all questions answered.  Ulcer right foot -Wound again appears epithelilalized. Debrided only periwound callus. Non-procedural debridement. -Apply ointment daily to keep area soft  Return if symptoms worsen or fail to improve.

## 2020-03-08 ENCOUNTER — Telehealth (HOSPITAL_COMMUNITY): Payer: Self-pay

## 2020-03-08 ENCOUNTER — Encounter (HOSPITAL_COMMUNITY): Payer: Self-pay

## 2020-03-08 NOTE — Telephone Encounter (Signed)
Attempted to call patient in regards to Cardiac Rehab - LM on VM Mailed letter 

## 2020-03-13 ENCOUNTER — Other Ambulatory Visit: Payer: Self-pay

## 2020-03-13 ENCOUNTER — Other Ambulatory Visit: Payer: Self-pay | Admitting: *Deleted

## 2020-03-13 ENCOUNTER — Other Ambulatory Visit: Payer: Self-pay | Admitting: Internal Medicine

## 2020-03-13 DIAGNOSIS — E1142 Type 2 diabetes mellitus with diabetic polyneuropathy: Secondary | ICD-10-CM

## 2020-03-13 DIAGNOSIS — M545 Low back pain, unspecified: Secondary | ICD-10-CM

## 2020-03-13 DIAGNOSIS — I10 Essential (primary) hypertension: Secondary | ICD-10-CM

## 2020-03-13 DIAGNOSIS — J309 Allergic rhinitis, unspecified: Secondary | ICD-10-CM

## 2020-03-13 NOTE — Procedures (Signed)
Lumbar Facet Joint Nerve Denervation  Patient: Rose [name]      Date of Birth: 03-07-1956 MRN: [id number] PCP: [name], MD      Visit Date: 01/26/2020   Universal Protocol:    Date/Time: 12/14/215:56 AM  Consent Given By: the patient  Position: PRONE  Additional Comments: Vital signs were monitored before and after the procedure. Patient was prepped and draped in the usual sterile fashion. The correct patient, procedure, and site was verified.   Injection Procedure Details:   Procedure diagnoses:  1. Spondylosis without myelopathy or radiculopathy, lumbar region      Meds Administered:  Meds ordered this encounter  Medications  . methylPREDNISolone acetate (DEPO-MEDROL) injection 80 mg     Laterality: Right  Location/Site:  L3-L4, L2 and L3 medial branches, L4-L5, L3 and L4 medial branches and L5-S1, L4 medial branch and L5 dorsal ramus  Needle: 18 ga.,  9mm active tip RF Cannula  Needle Placement: Along juncture of superior articular process and transverse pocess  Findings:  -Comments:  Procedure Details: For each desired target nerve, the corresponding transverse process (sacral ala for the L5 dorsal rami) was identified and the fluoroscope was positioned to square off the endplates of the corresponding vertebral body to achieve a true AP midline view.  The beam was then obliqued 15 to 20 degrees and caudally tilted 15 to 20 degrees to line up a trajectory along the target nerves. The skin over the target of the junction of superior articulating process and transverse process (sacral ala for the L5 dorsal rami) was infiltrated with 51ml of 1% Lidocaine without Epinephrine.  The 18 gauge 54mm active tip outer cannula was advanced in trajectory view to the target.  This procedure was repeated for each target nerve.  Then, for all levels, the outer cannula placement was fine-tuned and the position was then confirmed with bi-planar imaging.    Test  stimulation was done both at sensory and motor levels to ensure there was no radicular stimulation. The target tissues were then infiltrated with 1 ml of 1% Lidocaine without Epinephrine. Subsequently, a percutaneous neurotomy was carried out for 90 seconds at 80 degrees Celsius.  After the completion of the lesion, 1 ml of injectate was delivered. It was then repeated for each facet joint nerve mentioned above. Appropriate radiographs were obtained to verify the probe placement during the neurotomy.   Additional Comments:  The patient tolerated the procedure well Dressing: 2 x 2 sterile gauze and Band-Aid    Post-procedure details: Patient was observed during the procedure. Post-procedure instructions were reviewed.  Patient left the clinic in stable condition.

## 2020-03-13 NOTE — Progress Notes (Signed)
Jericha Aljean Horiuchi - 64 y.o. female MRN [id number]  Date of birth: 06-12-1955  Office Visit Note: Visit Date: 01/26/2020 PCP: [name], MD Referred by: [name], MD  Subjective: Chief Complaint  Patient presents with  . Lower Back - Pain   HPI:  Donnika Labrisha Wuellner is a 64 y.o. female who comes in today for planned radiofrequency ablation of the Right L3-L4, L4-L5 and L5-S1 Lumbar facet joints. This would be ablation of the corresponding medial branches and/or dorsal rami.  Patient has had double diagnostic blocks with more than 50% relief.  These are documented on pain diary.  They have had chronic back pain for quite some time, more than 3 months, which has been an ongoing situation with recalcitrant axial back pain.  They have no radicular pain.  Their axial pain is worse with standing and ambulating and on exam today with facet loading.  They have had physical therapy as well as home exercise program.  The imaging noted in the chart below indicated facet pathology. Accordingly they meet all the criteria and qualification for for radiofrequency ablation and we are going to complete this today hopefully for more longer term relief as part of comprehensive management program.  ROS Otherwise per HPI.  Assessment & Plan: Visit Diagnoses:    ICD-10-CM   1. Spondylosis without myelopathy or radiculopathy, lumbar region  M47.816 XR C-ARM NO REPORT    Radiofrequency,Lumbar    methylPREDNISolone acetate (DEPO-MEDROL) injection 80 mg    Plan: No additional findings.   Meds & Orders:  Meds ordered this encounter  Medications  . methylPREDNISolone acetate (DEPO-MEDROL) injection 80 mg    Orders Placed This Encounter  Procedures  . Radiofrequency,Lumbar  . XR C-ARM NO REPORT    Follow-up: Return if symptoms worsen or fail to improve.   Procedures: No procedures performed  Lumbar Facet Joint Nerve Denervation  Patient: Phyllis Nakina Spatz      Date of Birth:  1955-12-17 MRN: [id number] PCP: [name], MD      Visit Date: 01/26/2020   Universal Protocol:    Date/Time: 12/14/215:56 AM  Consent Given By: the patient  Position: PRONE  Additional Comments: Vital signs were monitored before and after the procedure. Patient was prepped and draped in the usual sterile fashion. The correct patient, procedure, and site was verified.   Injection Procedure Details:   Procedure diagnoses:  1. Spondylosis without myelopathy or radiculopathy, lumbar region      Meds Administered:  Meds ordered this encounter  Medications  . methylPREDNISolone acetate (DEPO-MEDROL) injection 80 mg     Laterality: Right  Location/Site:  L3-L4, L2 and L3 medial branches, L4-L5, L3 and L4 medial branches and L5-S1, L4 medial branch and L5 dorsal ramus  Needle: 18 ga.,  57mm active tip RF Cannula  Needle Placement: Along juncture of superior articular process and transverse pocess  Findings:  -Comments:  Procedure Details: For each desired target nerve, the corresponding transverse process (sacral ala for the L5 dorsal rami) was identified and the fluoroscope was positioned to square off the endplates of the corresponding vertebral body to achieve a true AP midline view.  The beam was then obliqued 15 to 20 degrees and caudally tilted 15 to 20 degrees to line up a trajectory along the target nerves. The skin over the target of the junction of superior articulating process and transverse process (sacral ala for the L5 dorsal rami) was infiltrated with 1ml of 1% Lidocaine without Epinephrine.  The 18  gauge 55mm active tip outer cannula was advanced in trajectory view to the target.  This procedure was repeated for each target nerve.  Then, for all levels, the outer cannula placement was fine-tuned and the position was then confirmed with bi-planar imaging.    Test stimulation was done both at sensory and motor levels to ensure there was no radicular stimulation.  The target tissues were then infiltrated with 1 ml of 1% Lidocaine without Epinephrine. Subsequently, a percutaneous neurotomy was carried out for 90 seconds at 80 degrees Celsius.  After the completion of the lesion, 1 ml of injectate was delivered. It was then repeated for each facet joint nerve mentioned above. Appropriate radiographs were obtained to verify the probe placement during the neurotomy.   Additional Comments:  The patient tolerated the procedure well Dressing: 2 x 2 sterile gauze and Band-Aid    Post-procedure details: Patient was observed during the procedure. Post-procedure instructions were reviewed.  Patient left the clinic in stable condition.       Clinical History: 64 year old female with right side low back pain radiating down the posterolateral thigh, stops at the knee.  EXAM: LUMBAR MYELOGRAM  FLUOROSCOPY TIME:  1 minutes 24 seconds  PROCEDURE: After thorough discussion of risks and benefits of the procedure including bleeding, infection, injury to nerves, blood vessels, adjacent structures as well as headache, written and oral informed consent was obtained. Consent was obtained by Dr. Genevie Ann. Time out form was completed. A "time-out" was performed.  Patient was positioned prone on the fluoroscopy table. Local anesthesia was provided with 1% lidocaine without epinephrine after prepped and draped in the usual sterile fashion. Puncture was performed at L2-L3 using a 3 1/2 inch 22-gauge spinal needle via left sub laminar approach. Using a single pass through the dura, the needle was placed within the thecal sac, with return of clear CSF. 13 milliliters of Isovue M-200 was injected into the thecal sac, with normal opacification of the nerve roots and cauda equina consistent with free flow within the subarachnoid space.  I personally performed the lumbar puncture and administered the intrathecal contrast. I also personally supervised acquisition  of the myelogram images.  TECHNIQUE: Contiguous axial images were obtained through the Lumbar spine after the intrathecal infusion of infusion. Coronal and sagittal reconstructions were obtained of the axial image sets.  COMPARISON:  Lumbar MRI 12/08/2008. Thoracolumbar radiographs 10/09/2005.  FINDINGS  CT LUMBAR MYELOGRAM FINDINGS:  Negative visible costophrenic angle. Partially visible evidence of extensive cholelithiasis in a distended gallbladder (series 5, image 40), and layering in the more normal appearing gallbladder neck (series 4, image 33). Otherwise negative visible noncontrast abdominal viscera. Aortoiliac calcified atherosclerosis.  Partially visible right side sacral spinal stimulator device.  Stable vertebral height and alignment since 2010 with no L4-L5 spondylolisthesis evident on these images, but there is vacuum facet phenomena at that level, see additional details below. No acute osseous abnormality identified. Intact visible sacrum and SI joints.  Normal myelographic appearance of the lower thoracic spinal cord with conus at L1-L2. No lower thoracic spinal stenosis. Cauda equina nerve roots appear normal.  T12-L1:  Mild facet hypertrophy.  No stenosis.  L1-L2:  Negative.  L2-L3: Mild circumferential disc bulge. Mild facet and ligament flavum hypertrophy. Mild left L2 neural foraminal stenosis.  L3-L4: Mild circumferential disc bulge. Mild to moderate facet and mild ligament flavum hypertrophy. No spinal or lateral recess stenosis. Mild L3 neural foraminal stenosis which appears greater on the left.  L4-L5: Mild circumferential disc bulge.  Moderate to severe facet hypertrophy with vacuum facet, greater on the left. Only mild ligament flavum hypertrophy. No spinal or lateral recess stenosis. Mild bilateral L4 foraminal stenosis appears fairly symmetric.  L5-S1:  Chronically ankylosed.  No stenosis.  IMPRESSION: 1. No lumbar  spinal stenosis or convincing right side neural impingement, but there is evidence of mild grade 1 anterolisthesis of L4 on L5 when upright, and moderate to severe facet arthropathy at that level. So perhaps L4-L5 is the symptomatic level. And there is underlying chronic L5-S1 ankylosis. Mild lumbar disc bulging. Up to mild bilateral L2 through L4 neural foraminal stenosis. 2. Cholelithiasis which may be severe and/or associated with porcelain gallbladder. Consider right upper quadrant ultrasound or CT abdomen. 3. Partially visible sacral stimulator device. 4.  Aortic Atherosclerosis (ICD10-I70.0).   Electronically Signed   By: Genevie Ann M.D.   On: 08/30/2018 11:11     Objective:  VS:  HT:    WT:   BMI:     BP:132/78  HR:88bpm  TEMP: ( )  RESP:  Physical Exam Constitutional:      General: She is not in acute distress.    Appearance: Normal appearance. She is not ill-appearing.  HENT:     Head: Normocephalic and atraumatic.     Right Ear: External ear normal.     Left Ear: External ear normal.  Eyes:     Extraocular Movements: Extraocular movements intact.  Cardiovascular:     Rate and Rhythm: Normal rate.     Pulses: Normal pulses.  Musculoskeletal:     Right lower leg: No edema.     Left lower leg: No edema.     Comments: Patient has good distal strength with no pain over the greater trochanters.  No clonus or focal weakness. Patient somewhat slow to rise from a seated position to full extension.  There is concordant low back pain with facet loading and lumbar spine extension rotation.  There are no definitive trigger points but the patient is somewhat tender across the lower back and PSIS.  There is no pain with hip rotation.   Skin:    Findings: No erythema, lesion or rash.  Neurological:     General: No focal deficit present.     Mental Status: She is alert and oriented to person, place, and time.     Sensory: No sensory deficit.     Motor: No weakness or  abnormal muscle tone.     Coordination: Coordination normal.  Psychiatric:        Mood and Affect: Mood normal.        Behavior: Behavior normal.      Imaging: No results found.

## 2020-03-13 NOTE — Telephone Encounter (Signed)
Pt is requesting all her medications be refilled, Can you call her back to clarify

## 2020-03-14 MED ORDER — FLUTICASONE PROPIONATE 50 MCG/ACT NA SUSP: NASAL | 5 refills | Status: DC

## 2020-03-15 ENCOUNTER — Other Ambulatory Visit: Payer: Self-pay | Admitting: *Deleted

## 2020-03-15 ENCOUNTER — Other Ambulatory Visit: Payer: Self-pay | Admitting: Cardiovascular Disease

## 2020-03-15 ENCOUNTER — Telehealth: Payer: Self-pay

## 2020-03-15 DIAGNOSIS — I10 Essential (primary) hypertension: Secondary | ICD-10-CM

## 2020-03-15 NOTE — Telephone Encounter (Signed)
Sees Dr. Fletcher Anon at [facility] office.

## 2020-03-16 ENCOUNTER — Other Ambulatory Visit: Payer: Self-pay | Admitting: *Deleted

## 2020-03-16 MED ORDER — HYDROCHLOROTHIAZIDE 25 MG PO TABS: ORAL_TABLET | ORAL | 1 refills | Status: DC

## 2020-03-16 MED ORDER — CETIRIZINE HCL 10 MG PO TABS
10.0000 mg | ORAL_TABLET | Freq: Every day | ORAL | 1 refills | Status: DC
Start: 2020-03-16 — End: 2020-04-07

## 2020-03-16 MED ORDER — CLOPIDOGREL BISULFATE 75 MG PO TABS
75.0000 mg | ORAL_TABLET | Freq: Every day | ORAL | 5 refills | Status: DC
Start: 2020-03-16 — End: 2020-10-02

## 2020-03-21 ENCOUNTER — Other Ambulatory Visit: Payer: Self-pay | Admitting: Internal Medicine

## 2020-03-21 DIAGNOSIS — I5032 Chronic diastolic (congestive) heart failure: Secondary | ICD-10-CM

## 2020-03-27 ENCOUNTER — Other Ambulatory Visit: Payer: Self-pay

## 2020-03-27 ENCOUNTER — Ambulatory Visit (INDEPENDENT_AMBULATORY_CARE_PROVIDER_SITE_OTHER): Payer: Medicare Other | Admitting: Podiatry

## 2020-03-27 DIAGNOSIS — L97511 Non-pressure chronic ulcer of other part of right foot limited to breakdown of skin: Secondary | ICD-10-CM | POA: Diagnosis not present

## 2020-03-27 DIAGNOSIS — E08621 Diabetes mellitus due to underlying condition with foot ulcer: Secondary | ICD-10-CM | POA: Diagnosis not present

## 2020-03-27 NOTE — Progress Notes (Signed)
°  Subjective:  Patient ID: Rose [name], female    DOB: [birth year],  MRN: [id number]  Chief Complaint  Patient presents with   Wound Check    Right foot lateral aspect wound check. Denies fever/chills/nausea/vomiting. Pt states occasional drainage. No acute concerns.   64 y.o. female presents for wound care. Hx confirmed with patient. Objective:  Physical Exam: Wound Location: right lateral 5th. Wound slightly larger than pinpoint with macerated wound base.  No warmth no erythema no signs of acute infection  Assessment:   1. Diabetic ulcer of other part of right foot associated with diabetes mellitus due to underlying condition, limited to breakdown of skin [facility])    Plan:  Patient was evaluated and treated and all questions answered.  Ulcer right foot -Wound with small area of continued open ulceration.  She is wearing normal shoe gear which is likely negatively affecting healing of her wound.  She would most benefit from diabetic shoes.  The wound was gently debrided nonprocedurally and dressed with Silvadene and Band-Aid.  Patient to continue doing so today  Return in about 1 month (around 04/27/2020) for Wound Care.

## 2020-03-28 ENCOUNTER — Telehealth: Payer: Self-pay

## 2020-03-28 NOTE — Telephone Encounter (Signed)
Pt needs a call back about her medication. 

## 2020-03-28 NOTE — Telephone Encounter (Addendum)
Return pt's call - stated she does not have any Flexerail to take ; informed pt , she will need a refill and I will send request to the doctor.

## 2020-04-02 ENCOUNTER — Other Ambulatory Visit: Payer: Self-pay | Admitting: Internal Medicine

## 2020-04-03 ENCOUNTER — Other Ambulatory Visit: Payer: Self-pay | Admitting: Internal Medicine

## 2020-04-04 ENCOUNTER — Other Ambulatory Visit: Payer: Self-pay | Admitting: Internal Medicine

## 2020-04-07 ENCOUNTER — Encounter (HOSPITAL_COMMUNITY): Payer: Self-pay | Admitting: Urgent Care

## 2020-04-07 ENCOUNTER — Ambulatory Visit: Admit: 2020-04-07 | Discharge status: Absent without leave | Payer: Medicare Other

## 2020-04-07 ENCOUNTER — Other Ambulatory Visit: Payer: Self-pay

## 2020-04-07 ENCOUNTER — Ambulatory Visit (HOSPITAL_COMMUNITY)
Admission: EM | Admit: 2020-04-07 | Discharge: 2020-04-07 | Disposition: A | Discharge status: Home / Self Care | Payer: Medicare Other | Attending: Urgent Care | Admitting: Urgent Care

## 2020-04-07 ENCOUNTER — Ambulatory Visit (INDEPENDENT_AMBULATORY_CARE_PROVIDER_SITE_OTHER): Payer: Medicare Other

## 2020-04-07 DIAGNOSIS — R059 Cough, unspecified: Secondary | ICD-10-CM | POA: Diagnosis not present

## 2020-04-07 DIAGNOSIS — F17200 Nicotine dependence, unspecified, uncomplicated: Secondary | ICD-10-CM | POA: Diagnosis not present

## 2020-04-07 DIAGNOSIS — R0989 Other specified symptoms and signs involving the circulatory and respiratory systems: Secondary | ICD-10-CM | POA: Insufficient documentation

## 2020-04-07 DIAGNOSIS — E119 Type 2 diabetes mellitus without complications: Secondary | ICD-10-CM | POA: Diagnosis not present

## 2020-04-07 DIAGNOSIS — Z20822 Contact with and (suspected) exposure to covid-19: Secondary | ICD-10-CM | POA: Diagnosis not present

## 2020-04-07 DIAGNOSIS — B349 Viral infection, unspecified: Secondary | ICD-10-CM | POA: Insufficient documentation

## 2020-04-07 MED ORDER — BENZONATATE 100 MG PO CAPS: 100.0000 mg | ORAL_CAPSULE | Freq: Three times a day (TID) | ORAL | 0 refills | Status: DC | PRN

## 2020-04-07 MED ORDER — CETIRIZINE HCL 10 MG PO TABS: 10.0000 mg | ORAL_TABLET | Freq: Every day | ORAL | 0 refills | Status: DC

## 2020-04-07 MED ORDER — PREDNISONE 10 MG PO TABS: 30.0000 mg | ORAL_TABLET | Freq: Every day | ORAL | 0 refills | Status: DC

## 2020-04-07 NOTE — Discharge Instructions (Signed)

## 2020-04-07 NOTE — ED Notes (Signed)
Patient wants to be called at her daughter's phone for Covid test results [phone]

## 2020-04-07 NOTE — ED Provider Notes (Signed)
[place]   MRN: [id number] DOB: 06-27-1955  Subjective:   Rose [name] is a 65 y.o. female presenting for 4-day history of acute onset productive cough, intermittent mild shortness of breath, chest congestion.  Patient has a history of asthma and has not been using her inhaler.  She also has tobacco use disorder and is trying to quit smoking.  Denies chest pain, body aches.  She is a diabetic.  Last A1c was 7.6% on 01/12/2020.  No current facility-administered medications for this encounter.  Current Outpatient Medications:  .  albuterol (PROVENTIL HFA;VENTOLIN HFA) 108 (90 Base) MCG/ACT inhaler, Inhale 2 puffs into the lungs every 6 (six) hours as needed. For wheezing (Patient taking differently: Inhale 2 puffs into the lungs every 6 (six) hours as needed for shortness of breath.), Disp: 1 Inhaler, Rfl: 2 .  aspirin EC 81 MG tablet, Take 1 tablet (81 mg total) by mouth daily., Disp: , Rfl:  .  buPROPion (WELLBUTRIN) 75 MG tablet, Take 75 mg by mouth daily., Disp: , Rfl:  .  carvedilol (COREG) 6.25 MG tablet, Take 1 tablet (6.25 mg total) by mouth 2 (two) times daily., Disp: 60 tablet, Rfl: 11 .  cetirizine (ZYRTEC) 10 MG tablet, Take 1 tablet (10 mg total) by mouth daily., Disp: 90 tablet, Rfl: 1 .  clopidogrel (PLAVIX) 75 MG tablet, Take 1 tablet (75 mg total) by mouth daily with breakfast., Disp: 30 tablet, Rfl: 5 .  cyclobenzaprine (FLEXERIL) 5 MG tablet, TAKE 1 TABLET(5 MG) BY MOUTH THREE TIMES DAILY AS NEEDED FOR MUSCLE SPASMS, Disp: 30 tablet, Rfl: 0 .  DULoxetine (CYMBALTA) 20 MG capsule, Take 3 capsules (60 mg total) by mouth daily., Disp: 90 capsule, Rfl: 2 .  fluticasone (FLONASE) 50 MCG/ACT nasal spray, SHAKE LIQUID AND USE 1 SPRAY IN EACH NOSTRIL DAILY, Disp: 16 g, Rfl: 5 .  hydrochlorothiazide (HYDRODIURIL) 25 MG tablet, TAKE 1 TABLET(25 MG) BY MOUTH DAILY, Disp: 90 tablet, Rfl: 1 .  lidocaine (XYLOCAINE) 5 % ointment, Apply 1 application  topically as needed. (Patient taking differently: Apply 1 application topically daily as needed for mild pain (on leg).), Disp: 50 g, Rfl: 0 .  metFORMIN (GLUCOPHAGE) 500 MG tablet, Take 2 tablets (1,000 mg total) by mouth daily with breakfast AND 1 tablet (500 mg total) every evening., Disp: 180 tablet, Rfl: 3 .  methadone (DOLOPHINE) 10 MG/5ML solution, Take 126 mg by mouth daily. , Disp: , Rfl:  .  Multiple Vitamins-Minerals (ONE-A-DAY WOMENS 50+ ADVANTAGE) TABS, Take 1 tablet by mouth daily. , Disp: , Rfl:  .  nicotine (NICODERM CQ - DOSED IN MG/24 HOURS) 14 mg/24hr patch, Medicaid [id number] L Wks 1-6: 14 mg x 1 patch daily. Wear for 24 hours. If you have sleep disturbances, remove at bedtime. (Patient taking differently: Place 14 mg onto the skin See admin instructions. Medicaid [id number] L Wks 1-6: 14 mg x 1 patch daily. Wear for 24 hours. If you have sleep disturbances, remove at bedtime.), Disp: 30 patch, Rfl: 2 .  SANTYL ointment, Apply 1 application topically daily. On foot, Disp: , Rfl:  .  silver sulfADIAZINE (SILVADENE) 1 % cream, Apply pea-sized amount to wound daily. (Patient taking differently: Apply 1 application topically See admin instructions. Apply pea-sized amount to wound daily.), Disp: 50 g, Rfl: 0 .  atorvastatin (LIPITOR) 40 MG tablet, Take 40 mg by mouth at bedtime., Disp: , Rfl:  .  atorvastatin (LIPITOR) 80 MG tablet, Take 1 tablet (80 mg total) by  mouth daily., Disp: 30 tablet, Rfl: 11   Allergies  Allergen Reactions  . Lisinopril Swelling    Angioedema  . Gabapentin Hives and Other (See Comments)    Only when takes over 300 mg dose. "gives me the shakes"  . Vicodin [Hydrocodone-Acetaminophen] Hives, Diarrhea and Other (See Comments)    Stomach  cramps    Past Medical History:  Diagnosis Date  . ACE inhibitor-aggravated angioedema, initial encounter 04/26/2016  . Allergic rhinitis   . Anxiety   . Arthritis   . Calculus of gallbladder without cholecystitis without  obstruction 10/28/2018  . Chronic back pain   . CKD (chronic kidney disease), stage III ([place])    folllowed by pcp  . Coronary artery disease   . Depression   . Diabetic neuropathy ([place])   . Edema of lower extremity 10/28/2018  . Encounter for completion of form with patient 06/21/2019  . Environmental allergies   . Environmental allergies 12/28/2015  . Fibromyalgia   . Frequency of urination   . History of chronic bronchitis   . History of drug abuse ([place]) states quit herion in 2006  . History of hepatitis C last abd ultrasound in epic 11-10-2018;  last hepative panel in epic 11-30-2018 was normal   previously followed by [facility] (notes in care everywhere)-- dx 04/ 2015,  started harvonic treatment 07/ 2015  . History of rib fracture 10/28/2018  . HPV in female 06/11/2016  . Hypertension    followed by pcp  (nuclear stress test 01-15-2017 in epic,  showed low risk normal w/ nuclear ef 55%)  . Laceration of right foot 03/04/2019  . Lipoma of neck 07/16/2017   Seen by general surgery Dr Marlou Starks May 16 - plan for removal of 2cm sebaceous cyst.   . Methadone maintenance therapy patient ([place])   . Mild asthma    followed by pcp  . Nocturia   . PAD (peripheral artery disease) ([place])    followed by cardiology, dr Fletcher Anon---  06-01-2019  s/p bilateral CIA angioplasty stenting  . Right foot ulcer ([place])   . Tendonitis    bilateral wrist  . Type 2 diabetes mellitus ([place])    followed by pcp   (06-16-2019  per pt checks blood sugar every other day in am,  fasting sugar 92-95)  . Urge urinary incontinence      Past Surgical History:  Procedure Laterality Date  . ABDOMINAL AORTOGRAM W/LOWER EXTREMITY Bilateral 06/01/2019   Procedure: ABDOMINAL AORTOGRAM W/LOWER EXTREMITY;  Surgeon: Wellington Hampshire, MD;  Location: [place] [facility];  Service: Cardiovascular;  Laterality: Bilateral;  . APPENDECTOMY  1972  . INTERSTIM IMPLANT PLACEMENT  10/23/2011   Procedure: [place]rie [place] IMPLANT FIRST  STAGE;  Surgeon: Reece Packer, MD;  Location: [facility];  Service: Urology;  Laterality: N/A;  . INTERSTIM IMPLANT PLACEMENT  10/23/2011   Procedure: Barrie Lyme IMPLANT SECOND STAGE;  Surgeon: Reece Packer, MD;  Location: [facility];  Service: Urology;  Laterality: N/A;  . LEFT HEART CATH AND CORONARY ANGIOGRAPHY N/A 01/11/2020   Procedure: LEFT HEART CATH AND CORONARY ANGIOGRAPHY;  Surgeon: Wellington Hampshire, MD;  Location: [place] [facility];  Service: Cardiovascular;  Laterality: N/A;  . PANACOS TISSUE GRAFT Right 06/24/2019   Procedure: Application of Skin Graft Substitute;  Surgeon: Evelina Bucy, DPM;  Location: [place];  Service: Podiatry;  Laterality: Right;  . PERIPHERAL VASCULAR INTERVENTION Bilateral 06/01/2019   Procedure: PERIPHERAL VASCULAR INTERVENTION;  Surgeon: Wellington Hampshire, MD;  Location: [place] [facility];  Service: Cardiovascular;  Laterality: Bilateral;  . WOUND DEBRIDEMENT Right 06/24/2019   Procedure: DEBRIDEMENT WOUND;  Surgeon: Evelina Bucy, DPM;  Location: [facility];  Service: Podiatry;  Laterality: Right;  . WOUND DEBRIDEMENT Right 08/24/2019   Procedure: DEBRIDEMENT RIGHT FOOT WOUND; APPLICATION OF SKIN GRAFT SUBSTITUTE;  Surgeon: Evelina Bucy, DPM;  Location: [place];  Service: Podiatry;  Laterality: Right;    Family History  Problem Relation Age of Onset  . Hypertension Mother   . Diabetes type II Mother   . Heart attack Mother 36  . CAD Father   . Heart attack Father 66  . Hypertension Brother   . Diabetes type II Brother   . Heart attack Brother 70  . Diabetes type II Sister     Social History   Tobacco Use  . Smoking status: Current Every Day Smoker    Packs/day: 0.50    Years: 20.00    Pack years: 10.00    Types: Cigarettes  . Smokeless tobacco: Never Used  Vaping Use  . Vaping Use: Never used  Substance Use Topics  . Alcohol use: No     Alcohol/week: 0.0 standard drinks  . Drug use: Not Currently    Comment: hx heroin use, IV,  last used 2006 (currently on methadone)    ROS   Objective:   Vitals: BP (!) 187/74 (BP Location: Left Arm)   Pulse 97   Temp 99 F (37.2 C) (Oral)   Resp 18   LMP 07/31/2011   SpO2 95%   Physical Exam Constitutional:      General: She is not in acute distress.    Appearance: Normal appearance. She is well-developed. She is not ill-appearing, toxic-appearing or diaphoretic.  HENT:     Head: Normocephalic and atraumatic.     Nose: Nose normal.     Mouth/Throat:     Mouth: Mucous membranes are moist.  Eyes:     Extraocular Movements: Extraocular movements intact.     Pupils: Pupils are equal, round, and reactive to light.  Cardiovascular:     Rate and Rhythm: Normal rate and regular rhythm.     Pulses: Normal pulses.     Heart sounds: Normal heart sounds. No murmur heard. No friction rub. No gallop.   Pulmonary:     Effort: Pulmonary effort is normal. No respiratory distress.     Breath sounds: No stridor. No wheezing, rhonchi or rales.     Comments: Decreased breath sounds.  Skin:    General: Skin is warm and dry.     Findings: No rash.  Neurological:     Mental Status: She is alert and oriented to person, place, and time.  Psychiatric:        Mood and Affect: Mood normal.        Behavior: Behavior normal.        Thought Content: Thought content normal.     Negative chest x-ray, over-read is pending.   Assessment and Plan :   PDMP not reviewed this encounter.  1. Viral syndrome   2. Cough   3. Chest congestion   4. Well controlled diabetes mellitus ([place])   5. Tobacco use disorder     COVID-19 testing pending.  In light of her asthma, chest symptoms will recommend an oral prednisone course.  Chest x-ray is negative, overread is pending.  We will contact patient if there is  anything abnormal. Counseled patient on potential for adverse effects with medications  prescribed/recommended today, ER and return-to-clinic precautions discussed, patient verbalized understanding.    Jaynee Eagles, [place] 04/07/20 [id number]

## 2020-04-07 NOTE — ED Triage Notes (Signed)
Pt reports URI Sx's for 4 days with a productive cough.

## 2020-04-08 LAB — SARS CORONAVIRUS 2 (TAT 6-24 HRS): SARS Coronavirus 2: NEGATIVE

## 2020-04-09 ENCOUNTER — Other Ambulatory Visit: Payer: Self-pay

## 2020-04-09 MED ORDER — ALBUTEROL SULFATE HFA 108 (90 BASE) MCG/ACT IN AERS
2.0000 | INHALATION_SPRAY | Freq: Four times a day (QID) | RESPIRATORY_TRACT | 2 refills | Status: DC | PRN
Start: 2020-04-09 — End: 2020-12-04

## 2020-04-09 NOTE — Telephone Encounter (Signed)
  albuterol (PROVENTIL HFA;VENTOLIN HFA) 108 (90 Base) MCG/ACT inhaler, REFILL REQUEST @  [id number] - [place], [address] AT [address] Phone:  [phone]  Fax:  [phone]

## 2020-04-17 NOTE — Progress Notes (Signed)
CC: HTN, DM, chest pain, and leg pain  HPI:  [name] is a 65 y.o. with the history listed below presenting for follow-up of her hypertension, diabetes, chest pain, and leg pain.  Past Medical History:  Diagnosis Date  . ACE inhibitor-aggravated angioedema, initial encounter 04/26/2016  . Allergic rhinitis   . Anxiety   . Arthritis   . Calculus of gallbladder without cholecystitis without obstruction 10/28/2018  . Chronic back pain   . CKD (chronic kidney disease), stage III ([place])    folllowed by pcp  . Coronary artery disease   . Depression   . Diabetic neuropathy ([place])   . Edema of lower extremity 10/28/2018  . Encounter for completion of form with patient 06/21/2019  . Environmental allergies   . Environmental allergies 12/28/2015  . Fibromyalgia   . Frequency of urination   . History of chronic bronchitis   . History of drug abuse ([place]) states quit herion in 2006  . History of hepatitis C last abd ultrasound in epic 11-10-2018;  last hepative panel in epic 11-30-2018 was normal   previously followed by [facility] (notes in care everywhere)-- dx 04/ 2015,  started harvonic treatment 07/ 2015  . History of rib fracture 10/28/2018  . HPV in female 06/11/2016  . Hypertension    followed by pcp  (nuclear stress test 01-15-2017 in epic,  showed low risk normal w/ nuclear ef 55%)  . Laceration of right foot 03/04/2019  . Lipoma of neck 07/16/2017   Seen by general surgery Dr Marlou Starks May 16 - plan for removal of 2cm sebaceous cyst.   . Methadone maintenance therapy patient ([place])   . Mild asthma    followed by pcp  . Nocturia   . PAD (peripheral artery disease) ([place])    followed by cardiology, dr Fletcher Anon---  06-01-2019  s/p bilateral CIA angioplasty stenting  . Right foot ulcer ([place])   . Tendonitis    bilateral wrist  . Type 2 diabetes mellitus ([place])    followed by pcp   (06-16-2019  per pt checks blood sugar every other day in am,  fasting sugar  92-95)  . Urge urinary incontinence    Review of Systems:   Constitutional: Negative for chills and fever.  Respiratory: Negative for shortness of breath.   Cardiovascular: Positive for chest pain, negative for leg swelling.  Gastrointestinal: Negative for abdominal pain, nausea and vomiting.  Neurological: Negative for dizziness and headaches.   Physical Exam:  Vitals:   04/19/20 0948 04/19/20 1019  BP: (!) 149/64 133/66  Pulse: 80 77  Temp: 98.7 F (37.1 C)   TempSrc: Oral   SpO2: 98%   Weight: 158 lb 14.4 oz (72.1 kg)   Height: 5\' 3"  (1.6 m)    Physical Exam Constitutional:      Appearance: Normal appearance.  HENT:     Head: Normocephalic and atraumatic.     Mouth/Throat:     Mouth: Mucous membranes are moist.     Pharynx: Oropharynx is clear.  Cardiovascular:     Rate and Rhythm: Normal rate and regular rhythm.     Pulses: Normal pulses.     Heart sounds: Normal heart sounds.  Pulmonary:     Effort: Pulmonary effort is normal.     Breath sounds: Normal breath sounds.  Abdominal:     General: Abdomen is flat. Bowel sounds are normal. There is no distension.     Palpations: Abdomen is soft.  Tenderness: There is no abdominal tenderness.  Musculoskeletal:     Cervical back: Normal range of motion and neck supple.  Skin:    General: Skin is warm.     Capillary Refill: Capillary refill takes less than 2 seconds.  Neurological:     General: No focal deficit present.     Mental Status: She is alert and oriented to person, place, and time.  Psychiatric:        Mood and Affect: Mood normal.        Behavior: Behavior normal.      Assessment & Plan:   See Encounters Tab for problem based charting.  Patient discussed with Dr. Evette Doffing

## 2020-04-19 ENCOUNTER — Ambulatory Visit (INDEPENDENT_AMBULATORY_CARE_PROVIDER_SITE_OTHER): Payer: Medicare Other | Admitting: Internal Medicine

## 2020-04-19 ENCOUNTER — Ambulatory Visit (HOSPITAL_COMMUNITY)
Admission: RE | Admit: 2020-04-19 | Discharge: 2020-04-19 | Disposition: A | Discharge status: Home / Self Care | Payer: Medicare Other | Source: Ambulatory Visit | Attending: Internal Medicine | Admitting: Internal Medicine

## 2020-04-19 VITALS — BP 133/66 | HR 77 | Temp 98.7°F | Ht 63.0 in | Wt 158.9 lb

## 2020-04-19 DIAGNOSIS — I10 Essential (primary) hypertension: Secondary | ICD-10-CM | POA: Diagnosis not present

## 2020-04-19 DIAGNOSIS — E1169 Type 2 diabetes mellitus with other specified complication: Secondary | ICD-10-CM | POA: Diagnosis not present

## 2020-04-19 DIAGNOSIS — I25118 Atherosclerotic heart disease of native coronary artery with other forms of angina pectoris: Secondary | ICD-10-CM

## 2020-04-19 DIAGNOSIS — R079 Chest pain, unspecified: Secondary | ICD-10-CM | POA: Insufficient documentation

## 2020-04-19 DIAGNOSIS — E118 Type 2 diabetes mellitus with unspecified complications: Secondary | ICD-10-CM | POA: Diagnosis not present

## 2020-04-19 DIAGNOSIS — I739 Peripheral vascular disease, unspecified: Secondary | ICD-10-CM

## 2020-04-19 DIAGNOSIS — Z794 Long term (current) use of insulin: Secondary | ICD-10-CM

## 2020-04-19 LAB — POCT GLYCOSYLATED HEMOGLOBIN (HGB A1C): Hemoglobin A1C: 7.9 % — AB (ref 4.0–5.6)

## 2020-04-19 LAB — GLUCOSE, CAPILLARY: Glucose-Capillary: 347 mg/dL — ABNORMAL HIGH (ref 70–99)

## 2020-04-19 MED ORDER — METFORMIN HCL 1000 MG PO TABS: 1000.0000 mg | ORAL_TABLET | Freq: Two times a day (BID) | ORAL | 5 refills | Status: DC

## 2020-04-19 NOTE — Assessment & Plan Note (Signed)
Patient is currently on hydrochlorothiazide 25 mg daily and Coreg 6.25 mg twice daily.  Blood pressure today is initially elevated at 149/64, repeat was 133/66.  She reports taking her HCTZ as prescribed, takes her Coreg in the morning however occasionally will miss it in the evening, only takes about 2 nights per week.  She states she takes her metformin twice a day.   -Continue HCTZ 25 mg daily -Continue current 6.25 mg twice a day

## 2020-04-19 NOTE — Assessment & Plan Note (Signed)
Patient reports having episodes of chest pain yesterday when she was sitting down watching TV.  She states that the pain is located in the middle/right side, sharp in nature, lasts 2 to 3 seconds, occurred a few times.  No associated symptoms such as shortness of breath, nausea, vomiting headaches, sweating, or abdominal pain.  Currently chest pain-free.  She was going to call her daughter however she decided against it since she was seeing her doctor the next day.  She does have occasional chest pain that typically occurs when she has been more active.  Felt like this was different since this was occurred at rest.  Denied any exertion or excessive exercise leading up to this.  She has a history of stable angina with a drug-eluting stent in her LAD.  Remains on aspirin, Plavix, and Lipitor.  Reports she takes this as prescribed.  Obtained an EKG that showed normal sinus rhythm, heart rate 73, normal PR and QT intervals, no acute ST changes, no TWI, chronic q waves in V1 and V2 (seen on prior).  This appears to be low risk for progression of ACS.   -Advised patient to continue aspirin, Plavix, and Lipitor -Discussed return parameters and when to contact us or go to ER

## 2020-04-19 NOTE — Assessment & Plan Note (Signed)
Patient is currently on metformin 500 mg twice a day.  She reports that she has been taking this as prescribed.  Reports that she had a sugary coffee drink this morning.  CBG was 347.  A1c today is 7.9, up from her A1c of 7.6 months ago.  Discussed increasing her metformin to improve diabetes control.  -Increase metformin to 1000 mg twice daily, advised to increase to 1000 mg in a.m. and 500 mg in p.m. for 1 week, then increase to 1000 twice daily -RTC in 3 months for repeat A1c

## 2020-04-19 NOTE — Patient Instructions (Addendum)
[name],  It was a pleasure to see you today. Thank you for coming in.   Today we discussed your diabetes, chest pain, leg pain, and your blood pressure.  In regards to your chest pain, we did an EKG which was reassuring, no new changes were seen. Please continue taking the aspirin, lipitor, and plavix as prescribed. Please continue to monitor, if your symptoms return or get worse please contact us or go to the ER.   In regards to your diabetes, please increase your metformin to 1000 mg twice a day. You can start with 1000 mg in the morning and 500 mg in the evening for 1 week, then increase to 1000 mg twice a day.   In regards to your blood pressure, this was a little high initially but it improved when we repeated it. Please continue your current medications.   Please return to clinic in 3 months or sooner if needed.   Thank you again for coming in.   Asencion Noble.D.

## 2020-04-19 NOTE — Assessment & Plan Note (Signed)
Patient reports having leg weakness about once per week for the past 6 months, states that her right leg will suddenly get weak and give out and she will fall down.  She reports chronic hip and leg pain.  It will often occur after walking for a bit.  She denies any lightheadedness, dizziness, chest pain, or syncope when this occurs.  Patient has history of PAD with pain and cramping as well as intermittent bilateral feet numbness and tingling.  -Advised patient to continue aspirin, Plavix, and Lipitor -Advised to start exercise regimen to see if this helps

## 2020-04-20 ENCOUNTER — Other Ambulatory Visit: Payer: Self-pay | Admitting: Internal Medicine

## 2020-04-20 DIAGNOSIS — Z794 Long term (current) use of insulin: Secondary | ICD-10-CM

## 2020-04-20 LAB — BMP8+ANION GAP
Anion Gap: 15 mmol/L (ref 10.0–18.0)
BUN/Creatinine Ratio: 13 (ref 12–28)
BUN: 18 mg/dL (ref 8–27)
CO2: 25 mmol/L (ref 20–29)
Calcium: 8.8 mg/dL (ref 8.7–10.3)
Chloride: 91 mmol/L — ABNORMAL LOW (ref 96–106)
Creatinine, Ser: 1.41 mg/dL — ABNORMAL HIGH (ref 0.57–1.00)
GFR calc Af Amer: 46 mL/min/{1.73_m2} — ABNORMAL LOW (ref >59)
GFR calc non Af Amer: 40 mL/min/{1.73_m2} — ABNORMAL LOW (ref >59)
Glucose: 232 mg/dL — ABNORMAL HIGH (ref 65–99)
Potassium: 3.8 mmol/L (ref 3.5–5.2)
Sodium: 131 mmol/L — ABNORMAL LOW (ref 134–144)

## 2020-04-20 NOTE — Telephone Encounter (Signed)
Looks like this medicine was discontinued by Dr. [name] on January 5th.

## 2020-04-24 ENCOUNTER — Ambulatory Visit (INDEPENDENT_AMBULATORY_CARE_PROVIDER_SITE_OTHER): Payer: Medicare Other | Admitting: Podiatry

## 2020-04-24 ENCOUNTER — Other Ambulatory Visit: Payer: Self-pay

## 2020-04-24 DIAGNOSIS — E08621 Diabetes mellitus due to underlying condition with foot ulcer: Secondary | ICD-10-CM | POA: Diagnosis not present

## 2020-04-24 DIAGNOSIS — L97511 Non-pressure chronic ulcer of other part of right foot limited to breakdown of skin: Secondary | ICD-10-CM | POA: Diagnosis not present

## 2020-04-24 NOTE — Progress Notes (Signed)
Internal Medicine Clinic Attending ° °Case discussed with Dr. Krienke  At the time of the visit.  We reviewed the resident’s history and exam and pertinent patient test results.  I agree with the assessment, diagnosis, and plan of care documented in the resident’s note.  °

## 2020-04-24 NOTE — Progress Notes (Signed)
  Subjective:  Patient ID: Rose [name], female    DOB: Apr 28, 1955,  MRN: [id number]  Chief Complaint  Patient presents with  . Ulcer    F/U Rt wound check -pt states," healing up slowly." - pt denies redness/swelling -w/ clear drainage Tx: silvadene and dressing change    65 y.o. female presents for wound care. Hx confirmed with patient. Objective:  Physical Exam: Wound Location: right lateral 5th epithelialized wound - no active ulcer. No warmth, erythema, signs of infection noted. Small amount of hyperkeratotic tissue noted.  Assessment:   1. Diabetic ulcer of other part of right foot associated with diabetes mellitus due to underlying condition, limited to breakdown of skin [facility])    Plan:  Patient was evaluated and treated and all questions answered.  Ulcer right foot -Wound has healed. Gently debrided today with 312 blade no open ulcer noted. Pt to continue applying antibiotic ointment to keep the area moist several times a week. -F/u in 6 weeks.  Return in about 6 weeks (around 06/05/2020) for Wound Care.

## 2020-05-02 ENCOUNTER — Telehealth: Payer: Self-pay | Admitting: *Deleted

## 2020-05-02 NOTE — Telephone Encounter (Signed)
   [facility] Pre-operative Risk Assessment    HEARTCARE STAFF: - Please ensure there is not already an duplicate clearance open for this procedure. - Under Visit Info/Reason for Call, type in Other and utilize the format Clearance MM/DD/YY or Clearance TBD. Do not use dashes or single digits. - If request is for dental extraction, please clarify the # of teeth to be extracted.  Request for surgical clearance:  1. What type of surgery is being performed? Teeth Extraction(4)   2. When is this surgery scheduled? TBD   3. What type of clearance is required (medical clearance vs. Pharmacy clearance to hold med vs. Both)? Medical  4. Are there any medications that need to be held prior to surgery and how long? Plavix/Aspirin   5. Practice name and name of physician performing surgery? [facility], Dr Hortencia Pilar   6. What is the office phone number? [phone]   7.   What is the office fax number? [phone]  8.   Anesthesia type (None, local, MAC, general) ? Local   Barbaraann Barthel 05/02/2020, 8:28 AM  _________________________________________________________________   (provider comments below)

## 2020-05-02 NOTE — Telephone Encounter (Signed)
Dr Fletcher Anon this patient had an LAD DES in Oct 2021.  She needs 4 teeth pulled and they are asking about holding Plavix and aspirin. Please respond to CV DIV PRE OP  Thanks  Kerin Ransom PA-C 05/02/2020 9:21 AM

## 2020-05-02 NOTE — Telephone Encounter (Signed)
Left message for patient to call back.  Kerin Ransom PA-C 05/02/2020 3:17 PM

## 2020-05-02 NOTE — Telephone Encounter (Signed)
Hold Plavix 5 days before and resume the day after extraction.  Aspirin 81 mg daily should not be interrupted.

## 2020-05-03 ENCOUNTER — Other Ambulatory Visit: Payer: Self-pay | Admitting: Internal Medicine

## 2020-05-03 DIAGNOSIS — E118 Type 2 diabetes mellitus with unspecified complications: Secondary | ICD-10-CM

## 2020-05-04 NOTE — Telephone Encounter (Signed)
   Primary Cardiologist: [name], MD  Chart reviewed as part of pre-operative protocol coverage. Patient was contacted 05/04/2020 in reference to pre-operative risk assessment for pending surgery as outlined below.  Rose [name] was last seen on 02/14/20 by Dr. Fletcher Anon.  Since that day, Rose [name] has done fine from a cardiac standpoint. She is limited in activity by leg pain which is unchanged from previous, though denies any chest pain or SOB.  Given low risk procedure, the patient would be at acceptable risk without further cardiovascular testing.   The patient was advised that if she develops new symptoms prior to surgery to contact our office to arrange for a follow-up visit, and she verbalized understanding.  Per Dr. Fletcher Anon, patient can hold plavix 5 days prior to her dental procedure, though aspirin should be continued uninterrupted. Plavix should be restarted as soon as she is cleared to do so by her dentist.  I will route this recommendation to the requesting party via [facility] fax function and remove from pre-op pool. Please call with questions.  Abigail Butts, PA-C 05/04/2020, 1:39 PM

## 2020-05-08 ENCOUNTER — Other Ambulatory Visit: Payer: Self-pay

## 2020-05-08 ENCOUNTER — Other Ambulatory Visit: Payer: Self-pay | Admitting: Podiatrist

## 2020-05-08 ENCOUNTER — Ambulatory Visit (INDEPENDENT_AMBULATORY_CARE_PROVIDER_SITE_OTHER): Payer: Medicare Other | Admitting: Podiatrist

## 2020-05-08 ENCOUNTER — Ambulatory Visit (INDEPENDENT_AMBULATORY_CARE_PROVIDER_SITE_OTHER): Payer: Medicare Other

## 2020-05-08 DIAGNOSIS — S90822A Blister (nonthermal), left foot, initial encounter: Secondary | ICD-10-CM

## 2020-05-08 DIAGNOSIS — E11621 Type 2 diabetes mellitus with foot ulcer: Secondary | ICD-10-CM

## 2020-05-08 DIAGNOSIS — L97521 Non-pressure chronic ulcer of other part of left foot limited to breakdown of skin: Secondary | ICD-10-CM

## 2020-05-08 MED ORDER — DOXYCYCLINE HYCLATE 100 MG PO TABS: 100.0000 mg | ORAL_TABLET | Freq: Two times a day (BID) | ORAL | 0 refills | Status: DC

## 2020-05-08 NOTE — Patient Instructions (Signed)
Keep your dressing intact on your foot-  Keep it dry as well  Wear your surgical shoe when you are up and on your foot.  You may remove it to sleep/ relax.  I have called in doxycycline to your pharmacy-  Go ahead and start taking that today or tomorrow.

## 2020-05-08 NOTE — Progress Notes (Signed)
No chief complaint on file.    HPI: Patient is 65 y.o. female who presents today for a new problem.  She has developed a blister on the side of her left foot.  She denies any injury and states she has had the blister for the past 2-3 days.  She has tried a bandaid and silvadene ointment.  Most recent A1c is 7, fbs 96.   Patient Active Problem List   Diagnosis Date Noted  . CAD (coronary artery disease), native coronary artery 01/17/2020  . Angina pectoris ([place]) 01/11/2020  . PAD (peripheral artery disease) ([place]) 08/18/2019  . Diabetic foot ulcer associated with type 2 diabetes mellitus ([place]) 06/21/2019  . Allergic rhinitis 10/28/2018  . Diabetic neuropathy ([place]) 06/02/2017  . Recurrent falls 04/30/2016  . Hyperlipidemia associated with type 2 diabetes mellitus ([place]) 12/13/2015  . Tobacco use disorder 12/13/2015  . Chronic renal insufficiency 12/12/2015  . Depression 12/12/2015  . Methadone maintenance therapy patient ([place])   . History of substance use disorder   . Chronic back pain   . Preventative health care 12/14/2014  . Type 2 diabetes mellitus ([place]) 02/27/2012  . Essential hypertension 02/27/2012    Current Outpatient Medications on File Prior to Visit  Medication Sig Dispense Refill  . albuterol (VENTOLIN HFA) 108 (90 Base) MCG/ACT inhaler Inhale 2 puffs into the lungs every 6 (six) hours as needed. For wheezing 1 each 2  . aspirin EC 81 MG tablet Take 1 tablet (81 mg total) by mouth daily.    [name] atorvastatin (LIPITOR) 40 MG tablet Take 40 mg by mouth daily.    [name] atorvastatin (LIPITOR) 80 MG tablet Take 1 tablet (80 mg total) by mouth daily. 30 tablet 11  . benzonatate (TESSALON) 100 MG capsule Take 1-2 capsules (100-200 mg total) by mouth 3 (three) times daily as needed. 60 capsule 0  . buPROPion (WELLBUTRIN) 75 MG tablet Take 75 mg by mouth daily.    . carvedilol (COREG) 6.25 MG tablet Take 1 tablet (6.25 mg total) by mouth 2 (two) times daily. 60 tablet 11  .  cetirizine (ZYRTEC ALLERGY) 10 MG tablet Take 1 tablet (10 mg total) by mouth daily. 30 tablet 0  . clopidogrel (PLAVIX) 75 MG tablet Take 1 tablet (75 mg total) by mouth daily with breakfast. 30 tablet 5  . cyclobenzaprine (FLEXERIL) 5 MG tablet TAKE 1 TABLET(5 MG) BY MOUTH THREE TIMES DAILY AS NEEDED FOR MUSCLE SPASMS 30 tablet 0  . DULoxetine (CYMBALTA) 20 MG capsule Take 3 capsules (60 mg total) by mouth daily. 90 capsule 2  . fluticasone (FLONASE) 50 MCG/ACT nasal spray SHAKE LIQUID AND USE 1 SPRAY IN EACH NOSTRIL DAILY 16 g 5  . hydrochlorothiazide (HYDRODIURIL) 25 MG tablet TAKE 1 TABLET(25 MG) BY MOUTH DAILY 90 tablet 1  . lidocaine (XYLOCAINE) 5 % ointment Apply 1 application topically as needed. (Patient taking differently: Apply 1 application topically daily as needed for mild pain (on leg).) 50 g 0  . metFORMIN (GLUCOPHAGE) 1000 MG tablet Take 1 tablet (1,000 mg total) by mouth 2 (two) times daily with a meal. 60 tablet 5  . methadone (DOLOPHINE) 10 MG/5ML solution Take 126 mg by mouth daily.     . Multiple Vitamins-Minerals (ONE-A-DAY WOMENS 50+ ADVANTAGE) TABS Take 1 tablet by mouth daily.     . nicotine (NICODERM CQ - DOSED IN MG/24 HOURS) 14 mg/24hr patch Medicaid [id number] L Wks 1-6: 14 mg x 1 patch daily. Wear for 24 hours. If you  have sleep disturbances, remove at bedtime. (Patient taking differently: Place 14 mg onto the skin See admin instructions. Medicaid [id number] L Wks 1-6: 14 mg x 1 patch daily. Wear for 24 hours. If you have sleep disturbances, remove at bedtime.) 30 patch 2  . predniSONE (DELTASONE) 10 MG tablet Take 3 tablets (30 mg total) by mouth daily with breakfast. 21 tablet 0  . SANTYL ointment Apply 1 application topically daily. On foot    . silver sulfADIAZINE (SILVADENE) 1 % cream Apply pea-sized amount to wound daily. (Patient taking differently: Apply 1 application topically See admin instructions. Apply pea-sized amount to wound daily.) 50 g 0   No current  facility-administered medications on file prior to visit.    Allergies  Allergen Reactions  . Lisinopril Swelling    Angioedema  . Gabapentin Hives and Other (See Comments)    Only when takes over 300 mg dose. "gives me the shakes"  . Vicodin [Hydrocodone-Acetaminophen] Hives, Diarrhea and Other (See Comments)    Stomach  cramps    Review of Systems No fevers, chills, nausea, muscle aches, no difficulty breathing, no calf pain, no chest pain or shortness of breath.   Physical Exam  GENERAL APPEARANCE: Alert, conversant. Appropriately groomed. No acute distress.   VASCULAR: Pedal pulses palpable 1/4 DP and PT left.  Capillary refill time is immediate to all digits,  Proximal to distal cooling it warm to warm.  Digital hair growth absent.   NEUROLOGIC: sensation is intact to 5.07 monofilament at 0/5 sites left. Light touch and vibratory sensation decreased on the left.  MUSCULOSKELETAL: acceptable muscle strength, tone and stability bilateral.  No gross boney pedal deformities noted.  No pain, crepitus or limitation noted with foot and ankle range of motion bilateral.   DERMATOLOGIC: large blister is present on the medial foot and medial first metatarsal head region left foot.  Some Peri-blister redness is also present. No streaking or lymphangitis is seen. No pus or pirulence expressed from the blisters.  Blisters contained clear fluid once drained.  xrays-  3 views of the left foot show no sign of osseous deformity.  No lytic changes seen no gas in the soft tissues noted.  Blister noted medial first metatarsal head.      Assessment     ICD-10-CM   1. Blister of left foot, initial encounter  X09.407W DG Foot Complete Left  2. Ulcerated, foot, left, limited to breakdown of skin [facility])  L97.521      Plan  Discussed the exam findings and treatment plan with the patient.  Cleansed the blister areas well with betadine and using a sterile 15 blade, lanced the two separate  blisters and expressed clear, sterile fluid.    Applied iodosorb gel and a dry, sterile compressive dressing to the foot and I recommended she keep the foot dry and not remove the dressing.    She will be seen back in a week with Dr. March Rummage for a recheck of this foot.    She will call if any questions or concerns arise prior to this visit.    I also called in doxycycline for her to start taking.

## 2020-05-11 ENCOUNTER — Telehealth (HOSPITAL_COMMUNITY): Payer: Self-pay

## 2020-05-11 NOTE — Telephone Encounter (Signed)
Called patient to see if she was interested in participating in the Cardiac Rehab Program. Patient stated yes. Patient will come in for orientation on 06/19/20 @ 10:30AM and will attend the 1:15PM exercise class. Went over insurance, patient verbalized understanding.   Tourist information centre manager.

## 2020-05-11 NOTE — Telephone Encounter (Signed)
Pt insurance is active and benefits verified through [facility] Medicare. Co-pay $0.00, DED $0.00/$0.00 met, out of pocket $7,550.00/$0.00 met, co-insurance 0%. No pre-authorization required. Passport, 05/11/20 @ 4:18PM, PJA#[id number]  2ndary insurance is active and benefits verified through Medicaid. Co-pay $3.00, DED $0.00/$0.00 met, out of pocket $0.00/$0.00 met, co-insurance 0%. No pre-authorization required. Passport, 05/11/20 @ 4:20PM, KWI#[id number]  Will contact patient to see if she is interested in the Cardiac Rehab Program.

## 2020-05-14 ENCOUNTER — Encounter: Payer: Self-pay | Admitting: Podiatrist

## 2020-05-15 ENCOUNTER — Other Ambulatory Visit: Payer: Self-pay

## 2020-05-15 ENCOUNTER — Ambulatory Visit (INDEPENDENT_AMBULATORY_CARE_PROVIDER_SITE_OTHER): Payer: Medicare Other | Admitting: Podiatry

## 2020-05-15 DIAGNOSIS — L97521 Non-pressure chronic ulcer of other part of left foot limited to breakdown of skin: Secondary | ICD-10-CM | POA: Diagnosis not present

## 2020-05-15 NOTE — Patient Instructions (Signed)
Remove bandage that was applied 2 days from now. Apply antibiotic ointment and a band-aid every day thereafter

## 2020-05-17 ENCOUNTER — Other Ambulatory Visit: Payer: Self-pay | Admitting: Internal Medicine

## 2020-05-17 MED ORDER — CYCLOBENZAPRINE HCL 5 MG PO TABS
ORAL_TABLET | ORAL | 0 refills | Status: DC
Start: 2020-05-17 — End: 2020-07-19

## 2020-05-17 MED ORDER — CETIRIZINE HCL 10 MG PO TABS: 10.0000 mg | ORAL_TABLET | Freq: Every day | ORAL | 0 refills | Status: DC

## 2020-05-17 NOTE — Telephone Encounter (Signed)
Refill request   cetirizine (ZYRTEC ALLERGY) 10 MG tablet\  cyclobenzaprine (FLEXERIL) 5 MG tablet  carvedilol (COREG) 6.25 MG tablet  [id number] - [place], [place] - [address] (Ph: [phone])

## 2020-06-04 ENCOUNTER — Other Ambulatory Visit: Payer: Self-pay

## 2020-06-04 DIAGNOSIS — E1142 Type 2 diabetes mellitus with diabetic polyneuropathy: Secondary | ICD-10-CM

## 2020-06-04 MED ORDER — DULOXETINE HCL 20 MG PO CPEP: 60.0000 mg | ORAL_CAPSULE | Freq: Every day | ORAL | 2 refills | Status: DC

## 2020-06-04 NOTE — Telephone Encounter (Signed)
Pt is requesting her DULoxetine (CYMBALTA) 20 MG capsule(Expired) sent to  [id number] [address], [place] - [address] [place] Phone:  [phone]  Fax:  [phone]     9 pt stated that she completely out )

## 2020-06-04 NOTE — Progress Notes (Signed)
  Subjective:  Patient ID: Rose [name], female    DOB: [birth year],  MRN: [id number]  Chief Complaint  Patient presents with  . Diabetic Ulcer    Follow up diabetic ulcer of left foot. Pt. States pain has improved and states she believe it is healing well.    65 y.o. female presents for wound care. Hx confirmed with patient. Objective:  Physical Exam: Wound Location: right lateral 5th epithelialized wound - no active ulcer. No warmth, erythema, signs of infection noted. Small amount of hyperkeratotic tissue noted.  Assessment:   1. Ulcerated, foot, left, limited to breakdown of skin [facility])    Plan:  Patient was evaluated and treated and all questions answered.  Ulcer right foot -Wound and blister both appear healed.  Trial return to normal shoe gear follow-up should issues persist No follow-ups on file.

## 2020-06-05 ENCOUNTER — Ambulatory Visit (INDEPENDENT_AMBULATORY_CARE_PROVIDER_SITE_OTHER): Payer: Medicare Other | Admitting: Podiatry

## 2020-06-05 ENCOUNTER — Other Ambulatory Visit: Payer: Self-pay

## 2020-06-05 DIAGNOSIS — L97521 Non-pressure chronic ulcer of other part of left foot limited to breakdown of skin: Secondary | ICD-10-CM

## 2020-06-11 ENCOUNTER — Telehealth (HOSPITAL_COMMUNITY): Payer: Self-pay | Admitting: Pharmacist

## 2020-06-12 ENCOUNTER — Telehealth: Payer: Self-pay

## 2020-06-12 NOTE — Telephone Encounter (Signed)
Pt is requesting herfluticasone (FLONASE) 50 MCG/ACT nasal spray & clopidogrel (PLAVIX) 75 MG tablet &hydrochlorothiazide (HYDRODIURIL) 25 MG tablet sent to her  [id number] [address], [place] - [address] AT [address] Phone:  [phone]  Fax:  [phone]

## 2020-06-12 NOTE — Telephone Encounter (Signed)
Patient has refills available on all 3 meds. She states Pharmacy does not have them. Call placed to [facility] [place] . States he has these refills and will get them ready now. Attempted to notify patient. No answer and [facility] is full.

## 2020-06-18 ENCOUNTER — Telehealth (HOSPITAL_COMMUNITY): Payer: Self-pay | Admitting: Internal Medicine

## 2020-06-18 ENCOUNTER — Telehealth (HOSPITAL_COMMUNITY): Payer: Self-pay

## 2020-06-18 ENCOUNTER — Encounter (HOSPITAL_COMMUNITY): Payer: Self-pay

## 2020-06-18 NOTE — Telephone Encounter (Signed)
Attempted to contact pt in regards to CR. LMTCB  Mailed letter

## 2020-06-19 ENCOUNTER — Ambulatory Visit (HOSPITAL_COMMUNITY): Payer: Medicare Other

## 2020-06-19 ENCOUNTER — Ambulatory Visit: Payer: Medicare Other | Admitting: Cardiovascular Disease

## 2020-06-20 ENCOUNTER — Telehealth: Payer: Self-pay | Admitting: Cardiovascular Disease

## 2020-06-20 NOTE — Telephone Encounter (Signed)
Pt c/o of Chest Pain: STAT if CP now or developed within 24 hours  1. Are you having CP right now? Not right now but had them early this morning .  It is like "clamping or a tightness.  She stated it is on either one side or the other   2. Are you experiencing any other symptoms (ex. SOB, nausea, vomiting, sweating)? Pt denies any of the listed symptoms   3. How long have you been experiencing CP? She stated this started a couple of weeks ago   4. Is your CP continuous or coming and going? Coming and goes   5. Have you taken Nitroglycerin? No   Best number - [phone]   ?

## 2020-06-20 NOTE — Telephone Encounter (Signed)
Cardiac Rehab - Pharmacy Resident Documentation   Patient unable to be reached after three call attempts. Please complete allergy verification and medication review during patient's cardiac rehab appointment.   Of note, patient canceled cardiac rehab appointment per 06/18/20 telephone encounter.  Fara Olden, PharmD PGY-1 Pharmacy Resident

## 2020-06-20 NOTE — Telephone Encounter (Signed)
Left message to call back  

## 2020-06-21 ENCOUNTER — Other Ambulatory Visit: Payer: Self-pay

## 2020-06-21 ENCOUNTER — Encounter: Payer: Self-pay | Admitting: Cardiovascular Disease

## 2020-06-21 ENCOUNTER — Ambulatory Visit (INDEPENDENT_AMBULATORY_CARE_PROVIDER_SITE_OTHER): Payer: Medicare Other | Admitting: Cardiovascular Disease

## 2020-06-21 VITALS — BP 130/72 | HR 77 | Ht 63.0 in | Wt 165.0 lb

## 2020-06-21 DIAGNOSIS — I739 Peripheral vascular disease, unspecified: Secondary | ICD-10-CM

## 2020-06-21 DIAGNOSIS — Z720 Tobacco use: Secondary | ICD-10-CM | POA: Diagnosis not present

## 2020-06-21 DIAGNOSIS — I10 Essential (primary) hypertension: Secondary | ICD-10-CM | POA: Diagnosis not present

## 2020-06-21 DIAGNOSIS — I2510 Atherosclerotic heart disease of native coronary artery without angina pectoris: Secondary | ICD-10-CM

## 2020-06-21 DIAGNOSIS — E785 Hyperlipidemia, unspecified: Secondary | ICD-10-CM

## 2020-06-21 NOTE — Patient Instructions (Signed)
Medication Instructions:  Your physician recommends that you continue on your current medications as directed. Please refer to the Current Medication list given to you today.  *If you need a refill on your cardiac medications before your next appointment, please call your pharmacy*   Lab Work: None ordered If you have labs (blood work) drawn today and your tests are completely normal, you will receive your results only by: [name] MyChart Message (if you have MyChart) OR . A paper copy in the mail If you have any lab test that is abnormal or we need to change your treatment, we will call you to review the results.   Testing/Procedures: None ordered   Follow-Up: At [facility], you and your health needs are our priority.  As part of our continuing mission to provide you with exceptional heart care, we have created designated Provider Care Teams.  These Care Teams include your primary Cardiologist (physician) and Advanced Practice Providers (APPs -  Physician Assistants and Nurse Practitioners) who all work together to provide you with the care you need, when you need it.  We recommend signing up for the patient portal called "MyChart".  Sign up information is provided on this After Visit Summary.  MyChart is used to connect with patients for Virtual Visits (Telemedicine).  Patients are able to view lab/test results, encounter notes, upcoming appointments, etc.  Non-urgent messages can be sent to your provider as well.   To learn more about what you can do with MyChart, go to [email or web address].    Your next appointment:   3 month(s)  The format for your next appointment:   In Person  Provider:   You may see [name], MD or one of the following Advanced Practice Providers on your designated Care Team:    [place], PA-C  Coletta Memos, FNP    Other Instructions N/A

## 2020-06-21 NOTE — Telephone Encounter (Signed)
Spoke with pt who state for the past few weeks she's been experiencing off and on chest pain. She describes pain as sharp and tight feeling that's some time on the right side, then others on the left. Pt report last episode was yesterday and it happened a couple times. She does report feeling lightheaded occasionally during episode.   Appointment scheduled for toady 3/24 with Dr. Fletcher Anon at [facility] office. Address provided to pt.

## 2020-06-21 NOTE — Progress Notes (Signed)
Cardiology Office Note   Date:  06/21/2020   ID:  Rose [name], Rose [name] 1955/08/22, MRN [id number]  PCP:  [name], MD  Cardiologist:   [name], MD   Chief Complaint  Patient presents with  . Follow-up    See telephone note---chest pain  Pt states CP happens every 2-3 days, feels like a sharp pain on left side, sometimes right side. Pt states she is trying to walk more but that is hard.       History of Present Illness: Rose [name] is a 65 y.o. female who is here today for follow-up visit regarding peripheral arterial disease and coronary artery disease.  The patient has multiple chronic medical conditions that include type 2 diabetes, essential hypertension, hyperlipidemia, remote IV drug use and tobacco use. She is status post bilateral common iliac artery stenting done in March 2021.  She had no significant infrainguinal disease at that time.  She had an ulceration on the right lateral foot at that time.  Postprocedure ABI in April was normal with normal velocities in the iliac stents by duplex. She was seen in September of 2021 for exertional chest pain.  She underwent a Lexiscan Myoview which showed evidence of anterior and anterolateral ischemia.  Cardiac catheterization in October showed severe one-vessel coronary artery disease with 90% heavily calcified stenosis in the mid LAD and moderate left circumflex disease.  Ejection fraction was normal with mildly elevated left ventricular end-diastolic pressure.  I performed successful intravascular lithotripsy and drug eluting stent placement to the mid LAD.    She quit smoking 2 weeks ago.  She had recent episodes of sharp chest discomfort sometimes on the right side and sometimes on the left side.  Most of the times these episodes last only for few seconds and they happen at rest.  This is not similar to what she had before her stent when she had substernal exertional chest tightness.  Past Medical  History:  Diagnosis Date  . ACE inhibitor-aggravated angioedema, initial encounter 04/26/2016  . Allergic rhinitis   . Anxiety   . Arthritis   . Calculus of gallbladder without cholecystitis without obstruction 10/28/2018  . Chronic back pain   . CKD (chronic kidney disease), stage III ([place])    folllowed by pcp  . Coronary artery disease   . Depression   . Diabetic neuropathy ([place])   . Edema of lower extremity 10/28/2018  . Encounter for completion of form with patient 06/21/2019  . Environmental allergies   . Environmental allergies 12/28/2015  . Fibromyalgia   . Frequency of urination   . History of chronic bronchitis   . History of drug abuse ([place]) states quit herion in 2006  . History of hepatitis C last abd ultrasound in epic 11-10-2018;  last hepative panel in epic 11-30-2018 was normal   previously followed by [facility] (notes in care everywhere)-- dx 04/ 2015,  started harvonic treatment 07/ 2015  . History of rib fracture 10/28/2018  . HPV in female 06/11/2016  . Hypertension    followed by pcp  (nuclear stress test 01-15-2017 in epic,  showed low risk normal w/ nuclear ef 55%)  . Laceration of right foot 03/04/2019  . Lipoma of neck 07/16/2017   Seen by general surgery Dr Marlou Starks May 16 - plan for removal of 2cm sebaceous cyst.   . Methadone maintenance therapy patient ([place])   . Mild asthma    followed by pcp  . Nocturia   .  PAD (peripheral artery disease) ([place])    followed by cardiology, dr Fletcher Anon---  06-01-2019  s/p bilateral CIA angioplasty stenting  . Right foot ulcer ([place])   . Tendonitis    bilateral wrist  . Type 2 diabetes mellitus ([place])    followed by pcp   (06-16-2019  per pt checks blood sugar every other day in am,  fasting sugar 92-95)  . Urge urinary incontinence     Past Surgical History:  Procedure Laterality Date  . ABDOMINAL AORTOGRAM W/LOWER EXTREMITY Bilateral 06/01/2019   Procedure: ABDOMINAL AORTOGRAM W/LOWER EXTREMITY;  Surgeon:  Wellington Hampshire, MD;  Location: [place] [facility];  Service: Cardiovascular;  Laterality: Bilateral;  . APPENDECTOMY  1972  . INTERSTIM IMPLANT PLACEMENT  10/23/2011   Procedure: Barrie [place] IMPLANT FIRST STAGE;  Surgeon: Reece Packer, MD;  Location: [facility];  Service: Urology;  Laterality: N/A;  . INTERSTIM IMPLANT PLACEMENT  10/23/2011   Procedure: Barrie Lyme IMPLANT SECOND STAGE;  Surgeon: Reece Packer, MD;  Location: [facility];  Service: Urology;  Laterality: N/A;  . LEFT HEART CATH AND CORONARY ANGIOGRAPHY N/A 01/11/2020   Procedure: LEFT HEART CATH AND CORONARY ANGIOGRAPHY;  Surgeon: Wellington Hampshire, MD;  Location: [place] [facility];  Service: Cardiovascular;  Laterality: N/A;  . PANACOS TISSUE GRAFT Right 06/24/2019   Procedure: Application of Skin Graft Substitute;  Surgeon: Evelina Bucy, DPM;  Location: [place];  Service: Podiatry;  Laterality: Right;  . PERIPHERAL VASCULAR INTERVENTION Bilateral 06/01/2019   Procedure: PERIPHERAL VASCULAR INTERVENTION;  Surgeon: Wellington Hampshire, MD;  Location: [place] [facility];  Service: Cardiovascular;  Laterality: Bilateral;  . WOUND DEBRIDEMENT Right 06/24/2019   Procedure: DEBRIDEMENT WOUND;  Surgeon: Evelina Bucy, DPM;  Location: [facility];  Service: Podiatry;  Laterality: Right;  . WOUND DEBRIDEMENT Right 08/24/2019   Procedure: DEBRIDEMENT RIGHT FOOT WOUND; APPLICATION OF SKIN GRAFT SUBSTITUTE;  Surgeon: Evelina Bucy, DPM;  Location: [place];  Service: Podiatry;  Laterality: Right;     Current Outpatient Medications  Medication Sig Dispense Refill  . albuterol (VENTOLIN HFA) 108 (90 Base) MCG/ACT inhaler Inhale 2 puffs into the lungs every 6 (six) hours as needed. For wheezing 1 each 2  . aspirin EC 81 MG tablet Take 1 tablet (81 mg total) by mouth daily.    [name] atorvastatin (LIPITOR) 40 MG tablet Take 40 mg by mouth daily.    [name]  atorvastatin (LIPITOR) 80 MG tablet Take 1 tablet (80 mg total) by mouth daily. 30 tablet 11  . benzonatate (TESSALON) 100 MG capsule Take 1-2 capsules (100-200 mg total) by mouth 3 (three) times daily as needed. 60 capsule 0  . buPROPion (WELLBUTRIN) 75 MG tablet Take 75 mg by mouth daily.    . carvedilol (COREG) 6.25 MG tablet Take 1 tablet (6.25 mg total) by mouth 2 (two) times daily. 60 tablet 11  . cetirizine (ZYRTEC ALLERGY) 10 MG tablet Take 1 tablet (10 mg total) by mouth daily. 30 tablet 0  . clopidogrel (PLAVIX) 75 MG tablet Take 1 tablet (75 mg total) by mouth daily with breakfast. 30 tablet 5  . cyclobenzaprine (FLEXERIL) 5 MG tablet TAKE 1 TABLET(5 MG) BY MOUTH THREE TIMES DAILY AS NEEDED FOR MUSCLE SPASMS 30 tablet 0  . doxycycline (VIBRA-TABS) 100 MG tablet Take 1 tablet (100 mg total) by mouth 2 (two) times daily. 20 tablet 0  . DULoxetine (CYMBALTA) 20 MG capsule Take  3 capsules (60 mg total) by mouth daily. 90 capsule 2  . fluticasone (FLONASE) 50 MCG/ACT nasal spray SHAKE LIQUID AND USE 1 SPRAY IN EACH NOSTRIL DAILY 16 g 5  . hydrochlorothiazide (HYDRODIURIL) 25 MG tablet TAKE 1 TABLET(25 MG) BY MOUTH DAILY 90 tablet 1  . lidocaine (XYLOCAINE) 5 % ointment Apply 1 application topically as needed. (Patient taking differently: Apply 1 application topically daily as needed for mild pain (on leg).) 50 g 0  . metFORMIN (GLUCOPHAGE) 1000 MG tablet Take 1 tablet (1,000 mg total) by mouth 2 (two) times daily with a meal. 60 tablet 5  . methadone (DOLOPHINE) 10 MG/5ML solution Take 126 mg by mouth daily.     . Multiple Vitamins-Minerals (ONE-A-DAY WOMENS 50+ ADVANTAGE) TABS Take 1 tablet by mouth daily.     . nicotine (NICODERM CQ - DOSED IN MG/24 HOURS) 14 mg/24hr patch Medicaid [id number] L Wks 1-6: 14 mg x 1 patch daily. Wear for 24 hours. If you have sleep disturbances, remove at bedtime. (Patient taking differently: Place 14 mg onto the skin See admin instructions. Medicaid [id number] L Wks  1-6: 14 mg x 1 patch daily. Wear for 24 hours. If you have sleep disturbances, remove at bedtime.) 30 patch 2  . predniSONE (DELTASONE) 10 MG tablet Take 3 tablets (30 mg total) by mouth daily with breakfast. 21 tablet 0  . SANTYL ointment Apply 1 application topically daily. On foot    . silver sulfADIAZINE (SILVADENE) 1 % cream Apply pea-sized amount to wound daily. (Patient taking differently: Apply 1 application topically See admin instructions. Apply pea-sized amount to wound daily.) 50 g 0   No current facility-administered medications for this visit.    Allergies:   Lisinopril, Gabapentin, and Vicodin [hydrocodone-acetaminophen]    Social History:  The patient  reports that she quit smoking about 2 weeks ago. Her smoking use included cigarettes. She has a 10.00 pack-year smoking history. She has never used smokeless tobacco. She reports previous drug use. She reports that she does not drink alcohol.   Family History:  The patient's family history includes CAD in her father; Diabetes type II in her brother, mother, and sister; Heart attack (age of onset: 61) in her brother; Heart attack (age of onset: 48) in her father and mother; Hypertension in her brother and mother.    ROS:  Please see the history of present illness.   Otherwise, review of systems are positive for none.   All other systems are reviewed and negative.    PHYSICAL EXAM: VS:  BP 130/72   Pulse 77   Ht 5\' 3"  (1.6 m)   Wt 165 lb (74.8 kg)   LMP 07/31/2011   BMI 29.23 kg/m  , BMI Body mass index is 29.23 kg/m. GEN: Well nourished, well developed, in no acute distress  HEENT: normal  Neck: no JVD, carotid bruits, or masses Cardiac: RRR; no murmurs, rubs, or gallops, mild bilateral leg edema Respiratory:  clear to auscultation bilaterally, normal work of breathing GI: soft, nontender, nondistended, + BS MS: no deformity or atrophy  Skin: warm and dry, no rash Neuro:  Strength and sensation are intact Psych:  euthymic mood, full affect Vascular: Posterior tibial pulses palpable bilaterally.   EKG:  EKG  Is  ordered today. EKG showed normal sinus rhythm with no significant ST or T wave changes.  Recent Labs: 09/15/2019: TSH 4.270 01/12/2020: Hemoglobin 10.9; Platelets 222 04/19/2020: BUN 18; Creatinine, Ser 1.41; Potassium 3.8; Sodium 131    Lipid Panel  Component Value Date/Time   CHOL 83 (L) 11/30/2018 0912   TRIG 109 11/30/2018 0912   HDL 31 (L) 11/30/2018 0912   CHOLHDL 2.7 11/30/2018 0912   LDLCALC 32 11/30/2018 0912      Wt Readings from Last 3 Encounters:  06/21/20 165 lb (74.8 kg)  04/19/20 158 lb 14.4 oz (72.1 kg)  02/14/20 165 lb (74.8 kg)        PAD Screen 12/19/2016  Previous PAD dx? No  Previous surgical procedure? No  Pain with walking? No  Feet/toe relief with dangling? Yes  Painful, non-healing ulcers? No  Extremities discolored? No      ASSESSMENT AND PLAN:  1.  Coronary artery disease involving native coronary arteries without angina : Her current chest pain is very different from her prior angina and seems to be musculoskeletal.  She has no exertional symptoms and EKG is normal.  Based on this, I do not think she requires further ischemic cardiac work-up.  She was reassured.  2. Peripheral arterial disease: Status post  stent placement to bilateral common iliac arteries.  No significant infrainguinal disease.  Most recent vascular studies showed normal ABI and patent iliac stents.  She has no ulceration at the present time.  3.  Tobacco use: She quit smoking 2 weeks ago and I congratulated her.  4.  Hyperlipidemia: Continue treatment with atorvastatin with a target LDL of less than 70.  Most recent lipid profile in September of 2020 showed an LDL of 32.  5.  Essential hypertension: Blood pressure is controlled.    Disposition:   FU with me in 3 months  Signed,  [name], MD  06/21/2020 3:33 PM    [facility]

## 2020-06-21 NOTE — Telephone Encounter (Signed)
Left message to call back  

## 2020-06-25 ENCOUNTER — Ambulatory Visit (HOSPITAL_COMMUNITY): Payer: Medicare Other

## 2020-06-27 ENCOUNTER — Ambulatory Visit (HOSPITAL_COMMUNITY): Payer: Medicare Other

## 2020-06-28 ENCOUNTER — Other Ambulatory Visit (HOSPITAL_COMMUNITY): Payer: Self-pay | Admitting: Cardiovascular Disease

## 2020-06-28 DIAGNOSIS — Z95828 Presence of other vascular implants and grafts: Secondary | ICD-10-CM

## 2020-06-28 NOTE — Progress Notes (Signed)
  Subjective:  Patient ID: Rose [name], female    DOB: 1955-04-06,  MRN: [id number]  Chief Complaint  Patient presents with  . diabetic foot ulcer    Diabetic foot ulcer- no drainage - a lot of peeling -swelling - redness- pain is a 65/10   65 y.o. female presents for wound care. Hx confirmed with patient. Objective:  Physical Exam: Wound Location: right lateral 5th epithelialized wound - no active ulcer. No warmth, erythema, signs of infection noted. Small amount of hyperkeratotic tissue noted. Maturing skin from previous blister sites without drainage or redness.  Some desquamation  Assessment:   1. Ulcerated, foot, left, limited to breakdown of skin [facility])    Plan:  Patient was evaluated and treated and all questions answered.  Ulcer right foot -Maturing skin with some residual desquamation.  Redundant skin manually removed.  Continue antibiotic ointment to maturing areas daily.  Follow-up in 1 month for recheck  Return in about 1 month (around 07/06/2020) for Wound Care, Left.

## 2020-06-29 ENCOUNTER — Ambulatory Visit (HOSPITAL_COMMUNITY): Payer: Medicare Other

## 2020-07-02 ENCOUNTER — Ambulatory Visit (HOSPITAL_COMMUNITY): Payer: Medicare Other

## 2020-07-03 ENCOUNTER — Telehealth (HOSPITAL_COMMUNITY): Payer: Self-pay

## 2020-07-03 NOTE — Telephone Encounter (Signed)
No response from pt.  Closed referral  

## 2020-07-04 ENCOUNTER — Ambulatory Visit (HOSPITAL_COMMUNITY): Payer: Medicare Other

## 2020-07-05 ENCOUNTER — Other Ambulatory Visit: Payer: Self-pay | Admitting: Family Medicine

## 2020-07-05 DIAGNOSIS — Z1231 Encounter for screening mammogram for malignant neoplasm of breast: Secondary | ICD-10-CM

## 2020-07-06 ENCOUNTER — Ambulatory Visit (HOSPITAL_COMMUNITY): Payer: Medicare Other

## 2020-07-09 ENCOUNTER — Ambulatory Visit (HOSPITAL_COMMUNITY): Payer: Medicare Other

## 2020-07-10 ENCOUNTER — Ambulatory Visit: Payer: Medicare Other | Admitting: Podiatry

## 2020-07-11 ENCOUNTER — Ambulatory Visit (HOSPITAL_COMMUNITY): Payer: Medicare Other

## 2020-07-13 ENCOUNTER — Ambulatory Visit (HOSPITAL_COMMUNITY): Payer: Medicare Other

## 2020-07-16 ENCOUNTER — Ambulatory Visit (HOSPITAL_COMMUNITY): Payer: Medicare Other

## 2020-07-17 ENCOUNTER — Ambulatory Visit: Payer: Medicare Other | Admitting: Podiatry

## 2020-07-18 ENCOUNTER — Ambulatory Visit (HOSPITAL_COMMUNITY): Payer: Medicare Other

## 2020-07-19 ENCOUNTER — Ambulatory Visit (INDEPENDENT_AMBULATORY_CARE_PROVIDER_SITE_OTHER): Payer: Medicare Other | Admitting: Internal Medicine

## 2020-07-19 ENCOUNTER — Other Ambulatory Visit: Payer: Self-pay

## 2020-07-19 VITALS — BP 148/73 | HR 81 | Temp 98.5°F | Wt 157.7 lb

## 2020-07-19 DIAGNOSIS — M5441 Lumbago with sciatica, right side: Secondary | ICD-10-CM | POA: Diagnosis not present

## 2020-07-19 DIAGNOSIS — G8929 Other chronic pain: Secondary | ICD-10-CM | POA: Diagnosis not present

## 2020-07-19 DIAGNOSIS — F17200 Nicotine dependence, unspecified, uncomplicated: Secondary | ICD-10-CM

## 2020-07-19 DIAGNOSIS — I10 Essential (primary) hypertension: Secondary | ICD-10-CM

## 2020-07-19 DIAGNOSIS — E1169 Type 2 diabetes mellitus with other specified complication: Secondary | ICD-10-CM | POA: Diagnosis not present

## 2020-07-19 DIAGNOSIS — R296 Repeated falls: Secondary | ICD-10-CM | POA: Diagnosis not present

## 2020-07-19 LAB — POCT GLYCOSYLATED HEMOGLOBIN (HGB A1C): Hemoglobin A1C: 6.3 % — AB (ref 4.0–5.6)

## 2020-07-19 LAB — GLUCOSE, CAPILLARY: Glucose-Capillary: 129 mg/dL — ABNORMAL HIGH (ref 70–99)

## 2020-07-19 MED ORDER — CYCLOBENZAPRINE HCL 5 MG PO TABS: ORAL_TABLET | ORAL | 2 refills | Status: DC

## 2020-07-19 NOTE — Patient Instructions (Signed)
Thank you, Ms.Henley [name] for allowing [place] to provide your care today. Today we discussed back pain, blood pressure, falls  I have ordered the following labs for you:   Lab Orders     BMP8+Anion Gap     Microalbumin / Creatinine Urine Ratio     POC Hbg A1C   Tests ordered today:   Referrals ordered today:    Referral Orders     Ambulatory referral to Ophthalmology   Medication Changes:   Medications Discontinued During This Encounter  Medication Reason  . cyclobenzaprine (FLEXERIL) 5 MG tablet Reorder     Meds ordered this encounter  Medications  . cyclobenzaprine (FLEXERIL) 5 MG tablet    Sig: TAKE 1 TABLET(5 MG) BY MOUTH THREE TIMES DAILY AS NEEDED FOR MUSCLE SPASMS    Dispense:  30 tablet    Refill:  2     Instructions:   Follow up: 1 week   Remember:    Should you have any questions or concerns please call the internal medicine clinic at [phone].     [name], D.O. [place]

## 2020-07-19 NOTE — Progress Notes (Signed)
CC: DM  HPI:  Rose [name] is a 65 y.o. female with a past medical history stated below and presents today for DM. Please see problem based assessment and plan for additional details.  Past Medical History:  Diagnosis Date  . ACE inhibitor-aggravated angioedema, initial encounter 04/26/2016  . Allergic rhinitis   . Anxiety   . Arthritis   . Calculus of gallbladder without cholecystitis without obstruction 10/28/2018  . Chronic back pain   . CKD (chronic kidney disease), stage III ([place])    folllowed by pcp  . Coronary artery disease   . Depression   . Diabetic neuropathy ([place])   . Edema of lower extremity 10/28/2018  . Encounter for completion of form with patient 06/21/2019  . Environmental allergies   . Environmental allergies 12/28/2015  . Fibromyalgia   . Frequency of urination   . History of chronic bronchitis   . History of drug abuse ([place]) states quit herion in 2006  . History of hepatitis C last abd ultrasound in epic 11-10-2018;  last hepative panel in epic 11-30-2018 was normal   previously followed by [facility] (notes in care everywhere)-- dx 04/ 2015,  started harvonic treatment 07/ 2015  . History of rib fracture 10/28/2018  . HPV in female 06/11/2016  . Hypertension    followed by pcp  (nuclear stress test 01-15-2017 in epic,  showed low risk normal w/ nuclear ef 55%)  . Laceration of right foot 03/04/2019  . Lipoma of neck 07/16/2017   Seen by general surgery Dr Marlou Starks May 16 - plan for removal of 2cm sebaceous cyst.   . Methadone maintenance therapy patient ([place])   . Mild asthma    followed by pcp  . Nocturia   . PAD (peripheral artery disease) ([place])    followed by cardiology, dr Fletcher Anon---  06-01-2019  s/p bilateral CIA angioplasty stenting  . Right foot ulcer ([place])   . Tendonitis    bilateral wrist  . Type 2 diabetes mellitus ([employer])    followed by pcp   (06-16-2019  per pt checks blood sugar every other day in am,  fasting sugar  92-95)  . Urge urinary incontinence     Current Outpatient Medications on File Prior to Visit  Medication Sig Dispense Refill  . albuterol (VENTOLIN HFA) 108 (90 Base) MCG/ACT inhaler Inhale 2 puffs into the lungs every 6 (six) hours as needed. For wheezing 1 each 2  . aspirin EC 81 MG tablet Take 1 tablet (81 mg total) by mouth daily.    [name] atorvastatin (LIPITOR) 40 MG tablet Take 40 mg by mouth daily.    [name] atorvastatin (LIPITOR) 80 MG tablet Take 1 tablet (80 mg total) by mouth daily. 30 tablet 11  . benzonatate (TESSALON) 100 MG capsule Take 1-2 capsules (100-200 mg total) by mouth 3 (three) times daily as needed. 60 capsule 0  . buPROPion (WELLBUTRIN) 75 MG tablet Take 75 mg by mouth daily.    . carvedilol (COREG) 6.25 MG tablet Take 1 tablet (6.25 mg total) by mouth 2 (two) times daily. 60 tablet 11  . cetirizine (ZYRTEC ALLERGY) 10 MG tablet Take 1 tablet (10 mg total) by mouth daily. 30 tablet 0  . clopidogrel (PLAVIX) 75 MG tablet Take 1 tablet (75 mg total) by mouth daily with breakfast. 30 tablet 5  . doxycycline (VIBRA-TABS) 100 MG tablet Take 1 tablet (100 mg total) by mouth 2 (two) times daily. 20 tablet 0  . DULoxetine (  CYMBALTA) 20 MG capsule Take 3 capsules (60 mg total) by mouth daily. 90 capsule 2  . fluticasone (FLONASE) 50 MCG/ACT nasal spray SHAKE LIQUID AND USE 1 SPRAY IN EACH NOSTRIL DAILY 16 g 5  . hydrochlorothiazide (HYDRODIURIL) 25 MG tablet TAKE 1 TABLET(25 MG) BY MOUTH DAILY 90 tablet 1  . lidocaine (XYLOCAINE) 5 % ointment Apply 1 application topically as needed. (Patient taking differently: Apply 1 application topically daily as needed for mild pain (on leg).) 50 g 0  . metFORMIN (GLUCOPHAGE) 1000 MG tablet Take 1 tablet (1,000 mg total) by mouth 2 (two) times daily with a meal. 60 tablet 5  . methadone (DOLOPHINE) 10 MG/5ML solution Take 126 mg by mouth daily.     . Multiple Vitamins-Minerals (ONE-A-DAY WOMENS 50+ ADVANTAGE) TABS Take 1 tablet by mouth daily.      . nicotine (NICODERM CQ - DOSED IN MG/24 HOURS) 14 mg/24hr patch Medicaid [id number] L Wks 1-6: 14 mg x 1 patch daily. Wear for 24 hours. If you have sleep disturbances, remove at bedtime. (Patient taking differently: Place 14 mg onto the skin See admin instructions. Medicaid [id number] L Wks 1-6: 14 mg x 1 patch daily. Wear for 24 hours. If you have sleep disturbances, remove at bedtime.) 30 patch 2  . predniSONE (DELTASONE) 10 MG tablet Take 3 tablets (30 mg total) by mouth daily with breakfast. 21 tablet 0  . SANTYL ointment Apply 1 application topically daily. On foot    . silver sulfADIAZINE (SILVADENE) 1 % cream Apply pea-sized amount to wound daily. (Patient taking differently: Apply 1 application topically See admin instructions. Apply pea-sized amount to wound daily.) 50 g 0   No current facility-administered medications on file prior to visit.    Family History  Problem Relation Age of Onset  . Hypertension Mother   . Diabetes type II Mother   . Heart attack Mother 35  . CAD Father   . Heart attack Father 51  . Hypertension Brother   . Diabetes type II Brother   . Heart attack Brother 60  . Diabetes type II Sister     Social History   Socioeconomic History  . Marital status: Single    Spouse name: Not on file  . Number of children: Not on file  . Years of education: Not on file  . Highest education level: Not on file  Occupational History  . Not on file  Tobacco Use  . Smoking status: Former Smoker    Packs/day: 0.50    Years: 20.00    Pack years: 10.00    Types: Cigarettes    Quit date: 06/07/2020    Years since quitting: 0.1  . Smokeless tobacco: Never Used  Vaping Use  . Vaping Use: Never used  Substance and Sexual Activity  . Alcohol use: No    Alcohol/week: 0.0 standard drinks  . Drug use: Not Currently    Comment: hx heroin use, IV,  last used 2006 (currently on methadone)  . Sexual activity: Not Currently  Other Topics Concern  . Not on file  Social  History Narrative  . Not on file   Social Determinants of Health   Financial Resource Strain: Not on file  Food Insecurity: Not on file  Transportation Needs: Not on file  Physical Activity: Not on file  Stress: Not on file  Social Connections: Not on file  Intimate Partner Violence: Not on file    Review of Systems: ROS negative except for what is noted on the  assessment and plan.  Vitals:   07/19/20 1053  BP: (!) 148/73  Pulse: 81  Temp: 98.5 F (36.9 C)  TempSrc: Oral  SpO2: 99%  Weight: 157 lb 11.2 oz (71.5 kg)    Physical Exam: Gen: A&O x3 and in no apparent distress, well appearing and nourished. HEENT: Head - normocephalic, atraumatic. Eye -  visual acuity grossly intact, conjunctiva clear, sclera non-icteric, EOM intact. Mouth - No obvious caries or periodontal disease. Neck: no obvious masses or nodules, AROM intact. CV: RRR, no murmurs, rubs, or gallops. S1/S2 presents  Resp: Clear to ascultation bilaterally  Abd: BS (+) x4, soft, non-tender, without obvious hepatosplenomegaly or masses MSK: Grossly normal AROM and strength x4 extremities. Skin: good skin turgor, no rashes, unusual bruising, or prominent lesions.  Neuro: No focal deficits, grossly normal sensation and coordination.  Psych: Oriented x3 and responding appropriately. Intact recent and remote memory, normal mood, judgement, affect , and insight.    Assessment & Plan:   See Encounters Tab for problem based charting.  Patient discussed with Dr. Lars Mage, D.O. [facility], PGY-2 Pager: [phone], Phone: [phone] Date 07/23/2020 Time 4:45 AM

## 2020-07-20 ENCOUNTER — Ambulatory Visit (HOSPITAL_COMMUNITY): Payer: Medicare Other

## 2020-07-20 LAB — BMP8+ANION GAP
Anion Gap: 21 mmol/L — ABNORMAL HIGH (ref 10.0–18.0)
BUN/Creatinine Ratio: 13 (ref 12–28)
BUN: 18 mg/dL (ref 8–27)
CO2: 19 mmol/L — ABNORMAL LOW (ref 20–29)
Calcium: 9.2 mg/dL (ref 8.7–10.3)
Chloride: 98 mmol/L (ref 96–106)
Creatinine, Ser: 1.43 mg/dL — ABNORMAL HIGH (ref 0.57–1.00)
Glucose: 128 mg/dL — ABNORMAL HIGH (ref 65–99)
Potassium: 3.8 mmol/L (ref 3.5–5.2)
Sodium: 138 mmol/L (ref 134–144)
eGFR: 41 mL/min/{1.73_m2} — ABNORMAL LOW (ref >59)

## 2020-07-20 LAB — MICROALBUMIN / CREATININE URINE RATIO
Creatinine, Urine: 45.5 mg/dL
Microalb/Creat Ratio: 58 mg/g creat — ABNORMAL HIGH (ref 0–29)
Microalbumin, Urine: 26.4 ug/mL

## 2020-07-23 ENCOUNTER — Encounter: Payer: Self-pay | Admitting: Internal Medicine

## 2020-07-23 ENCOUNTER — Ambulatory Visit (HOSPITAL_COMMUNITY): Payer: Medicare Other

## 2020-07-23 NOTE — Assessment & Plan Note (Signed)
Patient presents for reevaluation of her diabetes. Patient had an A1c today of 6.3 on metformin 1000 mg BID. She states that she is only taking it in the morning. She denies any side effects form this medication. He last eye exam was 2 years ago and showed retinopathy. Unable to perform foot exam today as she was wearing compression stockings and did not want to remove them.    Plan: 1. Follow up in 1 week and perform foot exam' 2. Counseled her on taking her medications as prescribed. 3. Optho refer for eye exam.

## 2020-07-23 NOTE — Assessment & Plan Note (Signed)
Counseled on smoking cessation. She set a stop date of June 1st. She is using patches and is down to 1 pack every 10 days.

## 2020-07-23 NOTE — Assessment & Plan Note (Signed)
Patient presents for evaluation of recurrent falls. She states in the last month she has been feeling a little lightheaded at time. She describes periods of orthostatic dizziness. Although she has had  falls in which she had no symptoms leading up to the event. She denies any associated weakness, chest pain, or shortness of breath. She has a significant CV history, all recent EKG have been normal sinus and her last echo did not show any valvular pathology.  She denies any associate palpitations,, new centrally acting medications, ETOH use, or relapses. She denies any foot pain that could have caused her falls, but does have a history of peripheral neuropathy. Orthostatic vitals were negative.   Plan: 1. Discussed making a follow up appointment with cardiology as she may need a Holter monitor 2. F/u in 2 weeks to evaluate feet and symptoms 3. Discussed how to stand and walk safety to avoid further falls.    4. Continue using compression stocking to help with dizziness.

## 2020-07-23 NOTE — Progress Notes (Signed)
Internal Medicine Clinic Attending  Case discussed with Dr. Coe  At the time of the visit.  We reviewed the resident's history and exam and pertinent patient test results.  I agree with the assessment, diagnosis, and plan of care documented in the resident's note.  

## 2020-07-23 NOTE — Assessment & Plan Note (Signed)
Continue current antihypertensive medications.

## 2020-07-24 ENCOUNTER — Ambulatory Visit (INDEPENDENT_AMBULATORY_CARE_PROVIDER_SITE_OTHER): Payer: Medicare Other | Admitting: Podiatry

## 2020-07-24 DIAGNOSIS — Z5329 Procedure and treatment not carried out because of patient's decision for other reasons: Secondary | ICD-10-CM

## 2020-07-24 NOTE — Progress Notes (Signed)
   Complete physical exam  Patient: Rose [name]   DOB: 01/18/1997   65 y.o. Female  MRN: [id number]  Subjective:    No chief complaint on file.   Rose [name] is a 65 y.o. female who presents today for a complete physical exam. She reports consuming a {diet types:17450} diet. {types:19826} She generally feels {DESC; WELL/FAIRLY WELL/POORLY:18703}. She reports sleeping {DESC; WELL/FAIRLY WELL/POORLY:18703}. She {does/does not:200015} have additional problems to discuss today.    Most recent fall risk assessment:    09/25/2021   10:42 AM  Fall Risk   Falls in the past year? 0  Number falls in past yr: 0  Injury with Fall? 0  Risk for fall due to : No Fall Risks  Follow up Falls evaluation completed     Most recent depression screenings:    09/25/2021   10:42 AM 08/16/2020   10:46 AM  PHQ 2/9 Scores  PHQ - 2 Score 0 0  PHQ- 9 Score 5     {VISON DENTAL STD PSA (Optional):27386}  {History (Optional):23778}  Patient Care Team: Jessup, Joy, NP as PCP - General (Nurse Practitioner)   Outpatient Medications Prior to Visit  Medication Sig   fluticasone (FLONASE) 50 MCG/ACT nasal spray Place 2 sprays into both nostrils in the morning and at bedtime. After 7 days, reduce to once daily.   norgestimate-ethinyl estradiol (SPRINTEC 28) 0.25-35 MG-MCG tablet Take 1 tablet by mouth daily.   Nystatin POWD Apply liberally to affected area 2 times per day   spironolactone (ALDACTONE) 100 MG tablet Take 1 tablet (100 mg total) by mouth daily.   No facility-administered medications prior to visit.    ROS        Objective:     There were no vitals taken for this visit. {Vitals History (Optional):23777}  Physical Exam   No results found for any visits on 10/31/21. {Show previous labs (optional):23779}    Assessment & Plan:    Routine Health Maintenance and Physical Exam  Immunization History  Administered Date(s) Administered   DTaP 04/03/1999, 05/30/1999,  08/08/1999, 04/23/2000, 11/07/2003   Hepatitis A 09/03/2007, 09/08/2008   Hepatitis B 01/19/1999, 02/26/1999, 08/08/1999   HiB (PRP-OMP) 04/03/1999, 05/30/1999, 08/08/1999, 04/23/2000   IPV 04/03/1999, 05/30/1999, 01/27/2000, 11/07/2003   Influenza,inj,Quad PF,6+ Mos 12/09/2013   Influenza-Unspecified 03/10/2012   MMR 01/26/2001, 11/07/2003   Meningococcal Polysaccharide 09/08/2011   Pneumococcal Conjugate-13 04/23/2000   Pneumococcal-Unspecified 08/08/1999, 10/22/1999   Tdap 09/08/2011   Varicella 01/27/2000, 09/03/2007    Health Maintenance  Topic Date Due   HIV Screening  Never done   Hepatitis C Screening  Never done   INFLUENZA VACCINE  10/29/2021   PAP-Cervical Cytology Screening  10/31/2021 (Originally 01/18/2020)   PAP SMEAR-Modifier  10/31/2021 (Originally 01/18/2020)   TETANUS/TDAP  10/31/2021 (Originally 09/07/2021)   HPV VACCINES  Discontinued   COVID-19 Vaccine  Discontinued    Discussed health benefits of physical activity, and encouraged her to engage in regular exercise appropriate for her age and condition.  Problem List Items Addressed This Visit   None Visit Diagnoses     Annual physical exam    -  Primary   Cervical cancer screening       Need for Tdap vaccination          No follow-ups on file.     Joy Jessup, NP   

## 2020-07-25 ENCOUNTER — Ambulatory Visit (HOSPITAL_COMMUNITY): Payer: Medicare Other

## 2020-07-27 ENCOUNTER — Encounter: Payer: Self-pay | Admitting: Internal Medicine

## 2020-07-27 ENCOUNTER — Other Ambulatory Visit: Payer: Self-pay

## 2020-07-27 ENCOUNTER — Ambulatory Visit (HOSPITAL_COMMUNITY): Payer: Medicare Other

## 2020-07-27 ENCOUNTER — Ambulatory Visit (INDEPENDENT_AMBULATORY_CARE_PROVIDER_SITE_OTHER): Payer: Medicare Other | Admitting: Internal Medicine

## 2020-07-27 VITALS — BP 136/80 | HR 80 | Temp 98.1°F | Ht 62.0 in | Wt 155.5 lb

## 2020-07-27 DIAGNOSIS — R296 Repeated falls: Secondary | ICD-10-CM | POA: Diagnosis not present

## 2020-07-27 NOTE — Patient Instructions (Signed)
Thank you, Ms.Devinn [name] for allowing [place] to provide your care today. Today we discussed falls and diabetes.    I have ordered the following labs for you:  Lab Orders  No laboratory test(s) ordered today     Tests ordered today:  none  Referrals ordered today:   Referral Orders  No referral(s) requested today     Medication Changes:   There are no discontinued medications.   No orders of the defined types were placed in this encounter.    Follow up: 2-3 months   Remember:    Should you have any questions or concerns please call the internal medicine clinic at [phone].     [name], D.O. [place]

## 2020-07-27 NOTE — Progress Notes (Signed)
CC: falls  HPI:  Rose [name] is a 65 y.o. female with a past medical history stated below and presents today for falls. Please see problem based assessment and plan for additional details.  Past Medical History:  Diagnosis Date  . ACE inhibitor-aggravated angioedema, initial encounter 04/26/2016  . Allergic rhinitis   . Anxiety   . Arthritis   . Calculus of gallbladder without cholecystitis without obstruction 10/28/2018  . Chronic back pain   . CKD (chronic kidney disease), stage III ([place])    folllowed by pcp  . Coronary artery disease   . Depression   . Diabetic neuropathy ([place])   . Edema of lower extremity 10/28/2018  . Encounter for completion of form with patient 06/21/2019  . Environmental allergies   . Environmental allergies 12/28/2015  . Fibromyalgia   . Frequency of urination   . History of chronic bronchitis   . History of drug abuse ([place]) states quit herion in 2006  . History of hepatitis C last abd ultrasound in epic 11-10-2018;  last hepative panel in epic 11-30-2018 was normal   previously followed by [facility] (notes in care everywhere)-- dx 04/ 2015,  started harvonic treatment 07/ 2015  . History of rib fracture 10/28/2018  . HPV in female 06/11/2016  . Hypertension    followed by pcp  (nuclear stress test 01-15-2017 in epic,  showed low risk normal w/ nuclear ef 55%)  . Laceration of right foot 03/04/2019  . Lipoma of neck 07/16/2017   Seen by general surgery Dr Marlou Starks May 16 - plan for removal of 2cm sebaceous cyst.   . Methadone maintenance therapy patient ([place])   . Mild asthma    followed by pcp  . Nocturia   . PAD (peripheral artery disease) ([place])    followed by cardiology, dr Fletcher Anon---  06-01-2019  s/p bilateral CIA angioplasty stenting  . Right foot ulcer ([place])   . Tendonitis    bilateral wrist  . Type 2 diabetes mellitus ([place])    followed by pcp   (06-16-2019  per pt checks blood sugar every other day in am,  fasting  sugar 92-95)  . Urge urinary incontinence     Current Outpatient Medications on File Prior to Visit  Medication Sig Dispense Refill  . albuterol (VENTOLIN HFA) 108 (90 Base) MCG/ACT inhaler Inhale 2 puffs into the lungs every 6 (six) hours as needed. For wheezing 1 each 2  . aspirin EC 81 MG tablet Take 1 tablet (81 mg total) by mouth daily.    [name] atorvastatin (LIPITOR) 40 MG tablet Take 40 mg by mouth daily.    [name] atorvastatin (LIPITOR) 80 MG tablet Take 1 tablet (80 mg total) by mouth daily. 30 tablet 11  . benzonatate (TESSALON) 100 MG capsule Take 1-2 capsules (100-200 mg total) by mouth 3 (three) times daily as needed. 60 capsule 0  . buPROPion (WELLBUTRIN) 75 MG tablet Take 75 mg by mouth daily.    . carvedilol (COREG) 6.25 MG tablet Take 1 tablet (6.25 mg total) by mouth 2 (two) times daily. 60 tablet 11  . cetirizine (ZYRTEC ALLERGY) 10 MG tablet Take 1 tablet (10 mg total) by mouth daily. 30 tablet 0  . clopidogrel (PLAVIX) 75 MG tablet Take 1 tablet (75 mg total) by mouth daily with breakfast. 30 tablet 5  . cyclobenzaprine (FLEXERIL) 5 MG tablet TAKE 1 TABLET(5 MG) BY MOUTH THREE TIMES DAILY AS NEEDED FOR MUSCLE SPASMS 30 tablet 2  .  doxycycline (VIBRA-TABS) 100 MG tablet Take 1 tablet (100 mg total) by mouth 2 (two) times daily. 20 tablet 0  . DULoxetine (CYMBALTA) 20 MG capsule Take 3 capsules (60 mg total) by mouth daily. 90 capsule 2  . fluticasone (FLONASE) 50 MCG/ACT nasal spray SHAKE LIQUID AND USE 1 SPRAY IN EACH NOSTRIL DAILY 16 g 5  . hydrochlorothiazide (HYDRODIURIL) 25 MG tablet TAKE 1 TABLET(25 MG) BY MOUTH DAILY 90 tablet 1  . lidocaine (XYLOCAINE) 5 % ointment Apply 1 application topically as needed. (Patient taking differently: Apply 1 application topically daily as needed for mild pain (on leg).) 50 g 0  . metFORMIN (GLUCOPHAGE) 1000 MG tablet Take 1 tablet (1,000 mg total) by mouth 2 (two) times daily with a meal. 60 tablet 5  . methadone (DOLOPHINE) 10 MG/5ML  solution Take 126 mg by mouth daily.     . Multiple Vitamins-Minerals (ONE-A-DAY WOMENS 50+ ADVANTAGE) TABS Take 1 tablet by mouth daily.     . nicotine (NICODERM CQ - DOSED IN MG/24 HOURS) 14 mg/24hr patch Medicaid [id number] L Wks 1-6: 14 mg x 1 patch daily. Wear for 24 hours. If you have sleep disturbances, remove at bedtime. (Patient taking differently: Place 14 mg onto the skin See admin instructions. Medicaid [id number] L Wks 1-6: 14 mg x 1 patch daily. Wear for 24 hours. If you have sleep disturbances, remove at bedtime.) 30 patch 2  . predniSONE (DELTASONE) 10 MG tablet Take 3 tablets (30 mg total) by mouth daily with breakfast. 21 tablet 0  . SANTYL ointment Apply 1 application topically daily. On foot    . silver sulfADIAZINE (SILVADENE) 1 % cream Apply pea-sized amount to wound daily. (Patient taking differently: Apply 1 application topically See admin instructions. Apply pea-sized amount to wound daily.) 50 g 0   No current facility-administered medications on file prior to visit.    Family History  Problem Relation Age of Onset  . Hypertension Mother   . Diabetes type II Mother   . Heart attack Mother 8  . CAD Father   . Heart attack Father 29  . Hypertension Brother   . Diabetes type II Brother   . Heart attack Brother 60  . Diabetes type II Sister     Social History   Socioeconomic History  . Marital status: Single    Spouse name: Not on file  . Number of children: Not on file  . Years of education: Not on file  . Highest education level: Not on file  Occupational History  . Not on file  Tobacco Use  . Smoking status: Light Tobacco Smoker    Packs/day: 0.50    Years: 20.00    Pack years: 10.00    Types: Cigarettes    Last attempt to quit: 06/07/2020    Years since quitting: 0.1  . Smokeless tobacco: Never Used  Vaping Use  . Vaping Use: Never used  Substance and Sexual Activity  . Alcohol use: No    Alcohol/week: 0.0 standard drinks  . Drug use: Not Currently     Comment: hx heroin use, IV,  last used 2006 (currently on methadone)  . Sexual activity: Not Currently  Other Topics Concern  . Not on file  Social History Narrative  . Not on file   Social Determinants of Health   Financial Resource Strain: Not on file  Food Insecurity: Not on file  Transportation Needs: Not on file  Physical Activity: Not on file  Stress: Not on file  Social  Connections: Not on file  Intimate Partner Violence: Not on file    Review of Systems: ROS negative except for what is noted on the assessment and plan.  Vitals:   07/27/20 0932  BP: 136/80  Pulse: 80  Temp: 98.1 F (36.7 C)  TempSrc: Oral  SpO2: 100%  Weight: 155 lb 8 oz (70.5 kg)  Height: 5\' 2"  (1.575 m)    Physical Exam: Gen: A&O x3 and in no apparent distress, well appearing and nourished. HEENT: Head - normocephalic, atraumatic. Eye -  visual acuity grossly intact, conjunctiva clear, sclera non-icteric, EOM intact with constricted pupils. Mouth - No obvious caries or periodontal disease. Neck: no obvious masses or nodules, AROM intact. CV: RRR, no murmurs, rubs, or gallops. S1/S2 presents  Resp: Clear to ascultation bilaterally  Abd: BS (+) x4, soft, non-tender, without obvious hepatosplenomegaly or masses MSK: Grossly normal AROM and strength x4 extremities. Skin: good skin turgor, no rashes, unusual bruising, or prominent lesions.  Neuro: No focal deficits, grossly normal sensation and coordination.  Psych: Oriented x3, mood lability with agitation and euphoria, Intact recent and remote memory.   Assessment & Plan:   See Encounters Tab for problem based charting.  Patient discussed with Dr. Hilbert Odor, D.O. [facility], PGY-2 Pager: [phone], Phone: [phone] Date 07/30/2020 Time 8:17 AM

## 2020-07-30 ENCOUNTER — Encounter: Payer: Self-pay | Admitting: Internal Medicine

## 2020-07-30 ENCOUNTER — Ambulatory Visit (HOSPITAL_COMMUNITY): Payer: Medicare Other

## 2020-07-30 NOTE — Assessment & Plan Note (Addendum)
Patient presents for reevaluation of her falls. On presentation today the patient has mood lability with periods of agitation intermixed with euphoria. Patient arrived a little over an hour prior to her schedule appointment time and became agitated that she had to wait for 30 minute to be seen. She states that she came to the clinic from [facility] where she receives a week supply of methadone every Friday. When questioned regarding her erratic behavior, she states that sometime the methadone makes her feel more talkative and goofy. She is on Methadone 120 mg daily. She admits to taking more then her prescribed dose in the past but denies doing this recently. She denies taking any other substances. She denies any chest pain, shortness of breath, dizziness, weakness, change in vision.  Assessment: Acute Methadone Intoxication. This could be due to her her ongoing CKD make her more susceptible to the side effect of her medication versus possibly taking more than her prescribed dose.   Plan: - Counseled her regarding her medication and the potential risk of taking more than prescribed. She states that she has a meeting with the [facility] provider and they are likely going to decrease her dose in the near future.  - I gave her strict return precaution and signs and symptoms that are concerning for acute intoxication. - I tried to call [facility] to discuss her medication management, but I could not reach anyone. I left a HIPPA compliant VM.  - I counseled the patient that she should refrain from driving while taking this medicaiton and to discuss her dosing with herr provider.

## 2020-08-01 ENCOUNTER — Ambulatory Visit (HOSPITAL_COMMUNITY): Payer: Medicare Other

## 2020-08-03 ENCOUNTER — Ambulatory Visit (HOSPITAL_COMMUNITY): Payer: Medicare Other

## 2020-08-03 NOTE — Progress Notes (Signed)
Internal Medicine Clinic Attending  Case discussed with Dr. Coe  At the time of the visit.  We reviewed the resident's history and exam and pertinent patient test results.  I agree with the assessment, diagnosis, and plan of care documented in the resident's note.  

## 2020-08-06 ENCOUNTER — Ambulatory Visit (HOSPITAL_COMMUNITY): Payer: Medicare Other

## 2020-08-07 ENCOUNTER — Ambulatory Visit (INDEPENDENT_AMBULATORY_CARE_PROVIDER_SITE_OTHER): Payer: Medicare Other | Admitting: Podiatry

## 2020-08-07 ENCOUNTER — Other Ambulatory Visit: Payer: Self-pay

## 2020-08-07 DIAGNOSIS — E1169 Type 2 diabetes mellitus with other specified complication: Secondary | ICD-10-CM

## 2020-08-07 DIAGNOSIS — B351 Tinea unguium: Secondary | ICD-10-CM | POA: Diagnosis not present

## 2020-08-07 DIAGNOSIS — L97521 Non-pressure chronic ulcer of other part of left foot limited to breakdown of skin: Secondary | ICD-10-CM

## 2020-08-07 DIAGNOSIS — E1142 Type 2 diabetes mellitus with diabetic polyneuropathy: Secondary | ICD-10-CM

## 2020-08-07 NOTE — Progress Notes (Signed)
  Subjective:  Patient ID: Rose [name], female    DOB: 1955/04/21,  MRN: [id number]  Chief Complaint  Patient presents with  . Wound Check    Doing well, denies f/c/n/v.   . Nail Problem    Thick/painful nails, desires trim.    65 y.o. female presents for wound care. Hx confirmed with patient. Objective:  Physical Exam: Wound Location: right lateral 5th epithelialized wound - no active ulcer. No warmth, erythema, signs of infection noted. Small amount of hyperkeratotic tissue noted. Maturing skin from previous blister sites without drainage or redness.  Some desquamation  Assessment:   1. Onychomycosis of multiple toenails with type 2 diabetes mellitus and peripheral neuropathy ([place])   2. Ulcerated, foot, left, limited to breakdown of skin [facility])    Plan:  Patient was evaluated and treated and all questions answered.  Ulcer right foot -Appears healed  Onychomycosis, DM, DPN -Nails debrided x10  Procedure: Nail Debridement Type of Debridement: manual, sharp debridement. Instrumentation: Nail nipper, rotary burr. Number of Nails: 10    Return if symptoms worsen or fail to improve.

## 2020-08-08 ENCOUNTER — Ambulatory Visit (HOSPITAL_COMMUNITY): Payer: Medicare Other

## 2020-08-10 ENCOUNTER — Encounter: Payer: Self-pay | Admitting: Internal Medicine

## 2020-08-10 ENCOUNTER — Ambulatory Visit (HOSPITAL_COMMUNITY): Payer: Medicare Other

## 2020-08-13 ENCOUNTER — Ambulatory Visit (HOSPITAL_COMMUNITY): Payer: Medicare Other

## 2020-08-15 ENCOUNTER — Ambulatory Visit (HOSPITAL_COMMUNITY): Payer: Medicare Other

## 2020-08-16 ENCOUNTER — Encounter (HOSPITAL_COMMUNITY): Payer: Self-pay | Admitting: Pharmacy Technician

## 2020-08-16 ENCOUNTER — Emergency Department (HOSPITAL_COMMUNITY)
Admission: EM | Admit: 2020-08-16 | Discharge: 2020-08-17 | Disposition: A | Discharge status: Home / Self Care | Payer: Medicare Other | Attending: Emergency Medicine | Admitting: Emergency Medicine

## 2020-08-16 ENCOUNTER — Emergency Department (HOSPITAL_COMMUNITY): Payer: Medicare Other

## 2020-08-16 DIAGNOSIS — Z9861 Coronary angioplasty status: Secondary | ICD-10-CM | POA: Insufficient documentation

## 2020-08-16 DIAGNOSIS — L97519 Non-pressure chronic ulcer of other part of right foot with unspecified severity: Secondary | ICD-10-CM | POA: Diagnosis not present

## 2020-08-16 DIAGNOSIS — E11621 Type 2 diabetes mellitus with foot ulcer: Secondary | ICD-10-CM | POA: Diagnosis not present

## 2020-08-16 DIAGNOSIS — S20319A Abrasion of unspecified front wall of thorax, initial encounter: Secondary | ICD-10-CM | POA: Diagnosis not present

## 2020-08-16 DIAGNOSIS — I129 Hypertensive chronic kidney disease with stage 1 through stage 4 chronic kidney disease, or unspecified chronic kidney disease: Secondary | ICD-10-CM | POA: Diagnosis not present

## 2020-08-16 DIAGNOSIS — Z7952 Long term (current) use of systemic steroids: Secondary | ICD-10-CM | POA: Insufficient documentation

## 2020-08-16 DIAGNOSIS — E1122 Type 2 diabetes mellitus with diabetic chronic kidney disease: Secondary | ICD-10-CM | POA: Insufficient documentation

## 2020-08-16 DIAGNOSIS — N183 Chronic kidney disease, stage 3 unspecified: Secondary | ICD-10-CM | POA: Diagnosis not present

## 2020-08-16 DIAGNOSIS — Z7982 Long term (current) use of aspirin: Secondary | ICD-10-CM | POA: Insufficient documentation

## 2020-08-16 DIAGNOSIS — I2510 Atherosclerotic heart disease of native coronary artery without angina pectoris: Secondary | ICD-10-CM | POA: Insufficient documentation

## 2020-08-16 DIAGNOSIS — F17210 Nicotine dependence, cigarettes, uncomplicated: Secondary | ICD-10-CM | POA: Diagnosis not present

## 2020-08-16 DIAGNOSIS — E1140 Type 2 diabetes mellitus with diabetic neuropathy, unspecified: Secondary | ICD-10-CM | POA: Diagnosis not present

## 2020-08-16 DIAGNOSIS — J4520 Mild intermittent asthma, uncomplicated: Secondary | ICD-10-CM | POA: Diagnosis not present

## 2020-08-16 DIAGNOSIS — M25462 Effusion, left knee: Secondary | ICD-10-CM | POA: Diagnosis not present

## 2020-08-16 DIAGNOSIS — Y92410 Unspecified street and highway as the place of occurrence of the external cause: Secondary | ICD-10-CM | POA: Insufficient documentation

## 2020-08-16 DIAGNOSIS — R079 Chest pain, unspecified: Secondary | ICD-10-CM | POA: Diagnosis not present

## 2020-08-16 DIAGNOSIS — S29001A Unspecified injury of muscle and tendon of front wall of thorax, initial encounter: Secondary | ICD-10-CM | POA: Diagnosis present

## 2020-08-16 DIAGNOSIS — Z743 Need for continuous supervision: Secondary | ICD-10-CM | POA: Diagnosis not present

## 2020-08-16 DIAGNOSIS — Z79899 Other long term (current) drug therapy: Secondary | ICD-10-CM | POA: Insufficient documentation

## 2020-08-16 DIAGNOSIS — M542 Cervicalgia: Secondary | ICD-10-CM | POA: Diagnosis not present

## 2020-08-16 DIAGNOSIS — M25562 Pain in left knee: Secondary | ICD-10-CM | POA: Diagnosis not present

## 2020-08-16 DIAGNOSIS — Z041 Encounter for examination and observation following transport accident: Secondary | ICD-10-CM | POA: Diagnosis not present

## 2020-08-16 MED ORDER — KETOROLAC TROMETHAMINE 10 MG PO TABS
10.0000 mg | ORAL_TABLET | Freq: Once | ORAL | Status: AC
  Administered 2020-08-16: 10 mg via ORAL
  Filled 2020-08-16: qty 1

## 2020-08-16 MED ORDER — IBUPROFEN 600 MG PO TABS: 600.0000 mg | ORAL_TABLET | Freq: Three times a day (TID) | ORAL | 0 refills | Status: AC | PRN
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
## 2020-08-16 NOTE — Discharge Instructions (Signed)
Take the ibuprofen as needed for pain.  Follow-up with your doctor for continued symptoms.  Please, for severe headache, blurry vision, weakness, or other emergencies.
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
## 2020-08-17 ENCOUNTER — Ambulatory Visit (HOSPITAL_COMMUNITY): Payer: Medicare Other

## 2020-08-28 ENCOUNTER — Other Ambulatory Visit: Payer: Self-pay

## 2020-08-28 ENCOUNTER — Ambulatory Visit
Admission: RE | Admit: 2020-08-28 | Discharge: 2020-08-28 | Disposition: A | Discharge status: Home / Self Care | Payer: Medicare Other | Source: Ambulatory Visit | Attending: Family Medicine | Admitting: Family Medicine

## 2020-08-28 DIAGNOSIS — Z1231 Encounter for screening mammogram for malignant neoplasm of breast: Secondary | ICD-10-CM

## 2020-08-29 ENCOUNTER — Encounter: Payer: Self-pay | Admitting: Internal Medicine

## 2020-08-31 ENCOUNTER — Encounter (HOSPITAL_COMMUNITY): Payer: Self-pay | Admitting: Emergency Medicine

## 2020-08-31 ENCOUNTER — Emergency Department (HOSPITAL_COMMUNITY): Payer: Medicare Other

## 2020-08-31 ENCOUNTER — Emergency Department (HOSPITAL_COMMUNITY)
Admission: EM | Admit: 2020-08-31 | Discharge: 2020-08-31 | Disposition: A | Discharge status: Home / Self Care | Payer: Medicare Other | Attending: Emergency Medicine | Admitting: Emergency Medicine

## 2020-08-31 ENCOUNTER — Other Ambulatory Visit: Payer: Self-pay

## 2020-08-31 DIAGNOSIS — R079 Chest pain, unspecified: Secondary | ICD-10-CM | POA: Diagnosis not present

## 2020-08-31 DIAGNOSIS — Z79899 Other long term (current) drug therapy: Secondary | ICD-10-CM | POA: Diagnosis not present

## 2020-08-31 DIAGNOSIS — Z7902 Long term (current) use of antithrombotics/antiplatelets: Secondary | ICD-10-CM | POA: Insufficient documentation

## 2020-08-31 DIAGNOSIS — I129 Hypertensive chronic kidney disease with stage 1 through stage 4 chronic kidney disease, or unspecified chronic kidney disease: Secondary | ICD-10-CM | POA: Insufficient documentation

## 2020-08-31 DIAGNOSIS — Z7951 Long term (current) use of inhaled steroids: Secondary | ICD-10-CM | POA: Diagnosis not present

## 2020-08-31 DIAGNOSIS — E785 Hyperlipidemia, unspecified: Secondary | ICD-10-CM | POA: Diagnosis not present

## 2020-08-31 DIAGNOSIS — I25119 Atherosclerotic heart disease of native coronary artery with unspecified angina pectoris: Secondary | ICD-10-CM | POA: Insufficient documentation

## 2020-08-31 DIAGNOSIS — R0789 Other chest pain: Secondary | ICD-10-CM | POA: Insufficient documentation

## 2020-08-31 DIAGNOSIS — Z7984 Long term (current) use of oral hypoglycemic drugs: Secondary | ICD-10-CM | POA: Diagnosis not present

## 2020-08-31 DIAGNOSIS — Z7982 Long term (current) use of aspirin: Secondary | ICD-10-CM | POA: Insufficient documentation

## 2020-08-31 DIAGNOSIS — F17290 Nicotine dependence, other tobacco product, uncomplicated: Secondary | ICD-10-CM | POA: Diagnosis not present

## 2020-08-31 DIAGNOSIS — E1122 Type 2 diabetes mellitus with diabetic chronic kidney disease: Secondary | ICD-10-CM | POA: Insufficient documentation

## 2020-08-31 DIAGNOSIS — J4520 Mild intermittent asthma, uncomplicated: Secondary | ICD-10-CM | POA: Diagnosis not present

## 2020-08-31 DIAGNOSIS — Y92410 Unspecified street and highway as the place of occurrence of the external cause: Secondary | ICD-10-CM | POA: Diagnosis not present

## 2020-08-31 DIAGNOSIS — I700 Atherosclerosis of aorta: Secondary | ICD-10-CM | POA: Diagnosis not present

## 2020-08-31 DIAGNOSIS — E11621 Type 2 diabetes mellitus with foot ulcer: Secondary | ICD-10-CM | POA: Insufficient documentation

## 2020-08-31 DIAGNOSIS — E1140 Type 2 diabetes mellitus with diabetic neuropathy, unspecified: Secondary | ICD-10-CM | POA: Diagnosis not present

## 2020-08-31 DIAGNOSIS — L97509 Non-pressure chronic ulcer of other part of unspecified foot with unspecified severity: Secondary | ICD-10-CM | POA: Diagnosis not present

## 2020-08-31 DIAGNOSIS — E1169 Type 2 diabetes mellitus with other specified complication: Secondary | ICD-10-CM | POA: Insufficient documentation

## 2020-08-31 DIAGNOSIS — N183 Chronic kidney disease, stage 3 unspecified: Secondary | ICD-10-CM | POA: Diagnosis not present

## 2020-08-31 LAB — CBC WITH DIFFERENTIAL/PLATELET
Abs Immature Granulocytes: 0.03 10*3/uL (ref 0.00–0.07)
Basophils Absolute: 0 10*3/uL (ref 0.0–0.1)
Basophils Relative: 1 %
Eosinophils Absolute: 0.2 10*3/uL (ref 0.0–0.5)
Eosinophils Relative: 3 %
HCT: 34 % — ABNORMAL LOW (ref 36.0–46.0)
Hemoglobin: 10.8 g/dL — ABNORMAL LOW (ref 12.0–15.0)
Immature Granulocytes: 0 %
Lymphocytes Relative: 32 %
Lymphs Abs: 2.6 10*3/uL (ref 0.7–4.0)
MCH: 27.1 pg (ref 26.0–34.0)
MCHC: 31.8 g/dL (ref 30.0–36.0)
MCV: 85.2 fL (ref 80.0–100.0)
Monocytes Absolute: 0.7 10*3/uL (ref 0.1–1.0)
Monocytes Relative: 8 %
Neutro Abs: 4.7 10*3/uL (ref 1.7–7.7)
Neutrophils Relative %: 56 %
Platelets: 226 10*3/uL (ref 150–400)
RBC: 3.99 MIL/uL (ref 3.87–5.11)
RDW: 12.6 % (ref 11.5–15.5)
WBC: 8.4 10*3/uL (ref 4.0–10.5)
nRBC: 0 % (ref 0.0–0.2)

## 2020-08-31 LAB — BASIC METABOLIC PANEL
Anion gap: 8 (ref 5–15)
BUN: 15 mg/dL (ref 8–23)
CO2: 27 mmol/L (ref 22–32)
Calcium: 9.2 mg/dL (ref 8.9–10.3)
Chloride: 96 mmol/L — ABNORMAL LOW (ref 98–111)
Creatinine, Ser: 1.36 mg/dL — ABNORMAL HIGH (ref 0.44–1.00)
GFR, Estimated: 44 mL/min — ABNORMAL LOW (ref >60)
Glucose, Bld: 106 mg/dL — ABNORMAL HIGH (ref 70–99)
Potassium: 3.7 mmol/L (ref 3.5–5.1)
Sodium: 131 mmol/L — ABNORMAL LOW (ref 135–145)

## 2020-08-31 LAB — TROPONIN I (HIGH SENSITIVITY): Troponin I (High Sensitivity): 4 ng/L (ref <18)

## 2020-08-31 MED ORDER — DICLOFENAC SODIUM 1 % EX GEL: 2.0000 g | Freq: Four times a day (QID) | CUTANEOUS | 0 refills | Status: DC
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
## 2020-08-31 NOTE — Discharge Instructions (Signed)
Your work-up today was overall reassuring.  Your x-rays did not show any sign of lung or rib injury. Your labs are reassuring, your heart looks good. This is likely musculoskeletal pain.  I recommend you take your Flexeril daily to help with muscle pain and spasm. Use the Voltaren gel to help with pain. Follow-up with your primary care doctor for symptoms not improving. Return to the emergency room with any new, worsening, concerning symptoms
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
## 2020-09-03 ENCOUNTER — Other Ambulatory Visit: Payer: Self-pay | Admitting: *Deleted

## 2020-09-03 DIAGNOSIS — I10 Essential (primary) hypertension: Secondary | ICD-10-CM

## 2020-09-05 ENCOUNTER — Emergency Department (HOSPITAL_COMMUNITY): Payer: Medicare Other

## 2020-09-05 ENCOUNTER — Encounter (HOSPITAL_COMMUNITY): Payer: Self-pay

## 2020-09-05 ENCOUNTER — Emergency Department (HOSPITAL_COMMUNITY)
Admission: EM | Admit: 2020-09-05 | Discharge: 2020-09-06 | Disposition: A | Discharge status: Home / Self Care | Payer: Medicare Other | Attending: Emergency Medicine | Admitting: Emergency Medicine

## 2020-09-05 ENCOUNTER — Other Ambulatory Visit: Payer: Self-pay

## 2020-09-05 DIAGNOSIS — S2222XA Fracture of body of sternum, initial encounter for closed fracture: Secondary | ICD-10-CM

## 2020-09-05 DIAGNOSIS — R600 Localized edema: Secondary | ICD-10-CM | POA: Diagnosis not present

## 2020-09-05 DIAGNOSIS — E1122 Type 2 diabetes mellitus with diabetic chronic kidney disease: Secondary | ICD-10-CM | POA: Insufficient documentation

## 2020-09-05 DIAGNOSIS — J45909 Unspecified asthma, uncomplicated: Secondary | ICD-10-CM | POA: Insufficient documentation

## 2020-09-05 DIAGNOSIS — N183 Chronic kidney disease, stage 3 unspecified: Secondary | ICD-10-CM | POA: Diagnosis not present

## 2020-09-05 DIAGNOSIS — F17210 Nicotine dependence, cigarettes, uncomplicated: Secondary | ICD-10-CM | POA: Insufficient documentation

## 2020-09-05 DIAGNOSIS — I2510 Atherosclerotic heart disease of native coronary artery without angina pectoris: Secondary | ICD-10-CM | POA: Diagnosis not present

## 2020-09-05 DIAGNOSIS — S299XXD Unspecified injury of thorax, subsequent encounter: Secondary | ICD-10-CM | POA: Diagnosis present

## 2020-09-05 DIAGNOSIS — Z7902 Long term (current) use of antithrombotics/antiplatelets: Secondary | ICD-10-CM | POA: Insufficient documentation

## 2020-09-05 DIAGNOSIS — M47814 Spondylosis without myelopathy or radiculopathy, thoracic region: Secondary | ICD-10-CM | POA: Diagnosis not present

## 2020-09-05 DIAGNOSIS — Z7982 Long term (current) use of aspirin: Secondary | ICD-10-CM | POA: Insufficient documentation

## 2020-09-05 DIAGNOSIS — Z7984 Long term (current) use of oral hypoglycemic drugs: Secondary | ICD-10-CM | POA: Diagnosis not present

## 2020-09-05 DIAGNOSIS — I129 Hypertensive chronic kidney disease with stage 1 through stage 4 chronic kidney disease, or unspecified chronic kidney disease: Secondary | ICD-10-CM | POA: Insufficient documentation

## 2020-09-05 DIAGNOSIS — S2222XD Fracture of body of sternum, subsequent encounter for fracture with routine healing: Secondary | ICD-10-CM | POA: Insufficient documentation

## 2020-09-05 DIAGNOSIS — R0789 Other chest pain: Secondary | ICD-10-CM | POA: Diagnosis not present

## 2020-09-05 DIAGNOSIS — Z79899 Other long term (current) drug therapy: Secondary | ICD-10-CM | POA: Insufficient documentation

## 2020-09-05 DIAGNOSIS — I700 Atherosclerosis of aorta: Secondary | ICD-10-CM | POA: Diagnosis not present

## 2020-09-05 DIAGNOSIS — R079 Chest pain, unspecified: Secondary | ICD-10-CM | POA: Diagnosis not present

## 2020-09-05 LAB — CBC WITH DIFFERENTIAL/PLATELET
Abs Immature Granulocytes: 0.02 10*3/uL (ref 0.00–0.07)
Basophils Absolute: 0 10*3/uL (ref 0.0–0.1)
Basophils Relative: 0 %
Eosinophils Absolute: 0.2 10*3/uL (ref 0.0–0.5)
Eosinophils Relative: 2 %
HCT: 32.9 % — ABNORMAL LOW (ref 36.0–46.0)
Hemoglobin: 10.3 g/dL — ABNORMAL LOW (ref 12.0–15.0)
Immature Granulocytes: 0 %
Lymphocytes Relative: 24 %
Lymphs Abs: 2 10*3/uL (ref 0.7–4.0)
MCH: 26.5 pg (ref 26.0–34.0)
MCHC: 31.3 g/dL (ref 30.0–36.0)
MCV: 84.6 fL (ref 80.0–100.0)
Monocytes Absolute: 0.8 10*3/uL (ref 0.1–1.0)
Monocytes Relative: 9 %
Neutro Abs: 5.5 10*3/uL (ref 1.7–7.7)
Neutrophils Relative %: 65 %
Platelets: 207 10*3/uL (ref 150–400)
RBC: 3.89 MIL/uL (ref 3.87–5.11)
RDW: 12.4 % (ref 11.5–15.5)
WBC: 8.4 10*3/uL (ref 4.0–10.5)
nRBC: 0 % (ref 0.0–0.2)

## 2020-09-05 LAB — TROPONIN I (HIGH SENSITIVITY): Troponin I (High Sensitivity): 5 ng/L (ref <18)

## 2020-09-05 LAB — BASIC METABOLIC PANEL
Anion gap: 9 (ref 5–15)
BUN: 16 mg/dL (ref 8–23)
CO2: 26 mmol/L (ref 22–32)
Calcium: 9 mg/dL (ref 8.9–10.3)
Chloride: 97 mmol/L — ABNORMAL LOW (ref 98–111)
Creatinine, Ser: 1.34 mg/dL — ABNORMAL HIGH (ref 0.44–1.00)
GFR, Estimated: 45 mL/min — ABNORMAL LOW (ref >60)
Glucose, Bld: 107 mg/dL — ABNORMAL HIGH (ref 70–99)
Potassium: 4.3 mmol/L (ref 3.5–5.1)
Sodium: 132 mmol/L — ABNORMAL LOW (ref 135–145)

## 2020-09-05 LAB — CBG MONITORING, ED
Glucose-Capillary: 168 mg/dL — ABNORMAL HIGH (ref 70–99)
Glucose-Capillary: 74 mg/dL (ref 70–99)

## 2020-09-05 NOTE — Discharge Instructions (Addendum)
Please continue treating your pain with over-the-counter medications. You have been prescribed a muscle relaxer, you can take this for additional pain relief.  Do not take this with the cyclobenzaprine (Flexeril) that you were previously prescribed Use the incentive spirometer (breathing apparatus) to help maintain healthy lungs as you heal. Follow closely with your primary care provider for follow-up. Return for sudden shortness of breath, or other concerning symptoms.

## 2020-09-05 NOTE — ED Notes (Signed)
This is [place] pt asked me when she was going to be seen I looked at pt labels and moved pt back on floor.

## 2020-09-05 NOTE — ED Triage Notes (Signed)
Patient complains of chest pain x 2 days. States that the pain is worse with movement and inspiration. Denies fever, no chills. States this am pain worse when she blew her nose

## 2020-09-05 NOTE — ED Notes (Signed)
Orange juice/sandwich given to patient.

## 2020-09-05 NOTE — ED Notes (Addendum)
IV team consult ordered after multiple unsuccessful venipuncture by RN.

## 2020-09-05 NOTE — ED Notes (Signed)
Called for recheck vitals no answer

## 2020-09-05 NOTE — ED Notes (Addendum)
Patient refused IV insertion by IV team nurse during procedure .
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
## 2020-09-05 NOTE — ED Notes (Signed)
Called pt for repeat trop no answer

## 2020-09-05 NOTE — ED Notes (Signed)
Called for repeat trop no answer
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
## 2020-09-06 DIAGNOSIS — S2222XD Fracture of body of sternum, subsequent encounter for fracture with routine healing: Secondary | ICD-10-CM | POA: Diagnosis not present

## 2020-09-06 LAB — TROPONIN I (HIGH SENSITIVITY): Troponin I (High Sensitivity): 5 ng/L (ref <18)

## 2020-09-06 MED ORDER — TIZANIDINE HCL 2 MG PO CAPS: 2.0000 mg | ORAL_CAPSULE | Freq: Two times a day (BID) | ORAL | 0 refills | Status: DC | PRN

## 2020-09-06 MED ORDER — HYDROCHLOROTHIAZIDE 25 MG PO TABS
ORAL_TABLET | ORAL | 1 refills | Status: DC
Start: 2020-09-06 — End: 2020-11-20

## 2020-09-06 MED ORDER — METHOCARBAMOL 500 MG PO TABS
750.0000 mg | ORAL_TABLET | Freq: Once | ORAL | Status: AC
  Administered 2020-09-06: 750 mg via ORAL
  Filled 2020-09-06: qty 2

## 2020-09-12 ENCOUNTER — Other Ambulatory Visit: Payer: Self-pay

## 2020-09-12 DIAGNOSIS — M5441 Lumbago with sciatica, right side: Secondary | ICD-10-CM

## 2020-09-12 NOTE — Telephone Encounter (Signed)
  cyclobenzaprine (FLEXERIL) 5 MG tablet, refill request @  [id number], [place] [address] [place] Phone:  [phone]  Fax:  [phone]

## 2020-09-13 MED ORDER — CYCLOBENZAPRINE HCL 5 MG PO TABS: ORAL_TABLET | ORAL | 2 refills | Status: DC

## 2020-09-17 ENCOUNTER — Ambulatory Visit (INDEPENDENT_AMBULATORY_CARE_PROVIDER_SITE_OTHER): Payer: Medicare Other | Admitting: Internal Medicine

## 2020-09-17 ENCOUNTER — Encounter: Payer: Self-pay | Admitting: Internal Medicine

## 2020-09-17 DIAGNOSIS — S2220XD Unspecified fracture of sternum, subsequent encounter for fracture with routine healing: Secondary | ICD-10-CM | POA: Diagnosis not present

## 2020-09-17 DIAGNOSIS — I25118 Atherosclerotic heart disease of native coronary artery with other forms of angina pectoris: Secondary | ICD-10-CM | POA: Diagnosis not present

## 2020-09-17 DIAGNOSIS — E1142 Type 2 diabetes mellitus with diabetic polyneuropathy: Secondary | ICD-10-CM | POA: Diagnosis not present

## 2020-09-17 MED ORDER — LIDOCAINE 1.8 % EX PTCH: 1.0000 | MEDICATED_PATCH | Freq: Every day | CUTANEOUS | 1 refills | Status: DC

## 2020-09-17 MED ORDER — DULOXETINE HCL 20 MG PO CPEP
60.0000 mg | ORAL_CAPSULE | Freq: Every day | ORAL | 2 refills | Status: DC
Start: 2020-09-17 — End: 2020-11-21

## 2020-09-17 MED ORDER — CARVEDILOL 6.25 MG PO TABS: 6.2500 mg | ORAL_TABLET | Freq: Two times a day (BID) | ORAL | 11 refills | Status: DC
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
## 2020-09-18 ENCOUNTER — Telehealth: Payer: Self-pay

## 2020-09-18 ENCOUNTER — Telehealth: Payer: Self-pay | Admitting: *Deleted

## 2020-09-18 DIAGNOSIS — S2220XA Unspecified fracture of sternum, initial encounter for closed fracture: Secondary | ICD-10-CM | POA: Insufficient documentation

## 2020-09-18 NOTE — Telephone Encounter (Signed)
Pharmacy called, notified PA was approved and to run RX through and call patient. SChaplin, RN,BSN

## 2020-09-18 NOTE — Telephone Encounter (Signed)
PA for Ztlido 1.8% was approved .

## 2020-09-18 NOTE — Progress Notes (Signed)
CC: ED Follow Up for Chest Pain   HPI:  [name] is a 65 y.o. person, with a PMH noted below, who presents to the clinic for follow up from an ED visit. To see the management of their acute and chronic conditions, please see the A&P note under the Encounters tab.   Past Medical History:  Diagnosis Date   ACE inhibitor-aggravated angioedema, initial encounter 04/26/2016   Allergic rhinitis    Anxiety    Arthritis    Calculus of gallbladder without cholecystitis without obstruction 10/28/2018   Chronic back pain    CKD (chronic kidney disease), stage III ([place])    folllowed by pcp   Coronary artery disease    Depression    Diabetic neuropathy ([place])    Edema of lower extremity 10/28/2018   Encounter for completion of form with patient 06/21/2019   Environmental allergies    Environmental allergies 12/28/2015   Fibromyalgia    Frequency of urination    History of chronic bronchitis    History of drug abuse ([place]) states quit herion in 2006   History of hepatitis C last abd ultrasound in epic 11-10-2018;  last hepative panel in epic 11-30-2018 was normal   previously followed by [facility] (notes in care everywhere)-- dx 04/ 2015,  started harvonic treatment 07/ 2015   History of rib fracture 10/28/2018   HPV in female 06/11/2016   Hypertension    followed by pcp  (nuclear stress test 01-15-2017 in epic,  showed low risk normal w/ nuclear ef 55%)   Laceration of right foot 03/04/2019   Lipoma of neck 07/16/2017   Seen by general surgery Dr Marlou Starks May 16 - plan for removal of 2cm sebaceous cyst.    Methadone maintenance therapy patient ([place])    Mild asthma    followed by pcp   Nocturia    PAD (peripheral artery disease) ([place])    followed by cardiology, dr Fletcher Anon---  06-01-2019  s/p bilateral CIA angioplasty stenting   Right foot ulcer ([place])    Tendonitis    bilateral wrist   Type 2 diabetes mellitus ([place])    followed by pcp   (06-16-2019  per pt  checks blood sugar every other day in am,  fasting sugar 92-95)   Urge urinary incontinence    Review of Systems:   Review of Systems  Constitutional:  Negative for chills, diaphoresis, fever, malaise/fatigue and weight loss.  Respiratory:  Negative for cough, hemoptysis, sputum production and shortness of breath.   Cardiovascular:  Positive for chest pain. Negative for palpitations, orthopnea, claudication and leg swelling.       Midline chest pain  Gastrointestinal:  Negative for abdominal pain, blood in stool, constipation, diarrhea, nausea and vomiting.  Musculoskeletal:  Negative for back pain, joint pain, myalgias and neck pain.  Neurological:  Negative for dizziness and headaches.    Physical Exam:  Vitals:   09/17/20 1426 09/17/20 1454  BP: (!) 177/96 (!) 152/81  Pulse: (!) 104 90  Temp: 98.4 F (36.9 C)   TempSrc: Oral   SpO2: 100%   Height: 5\' 3"  (1.6 m)    Physical Exam Constitutional:      General: She is not in acute distress.    Appearance: Normal appearance. She is not ill-appearing, toxic-appearing or diaphoretic.  Cardiovascular:     Rate and Rhythm: Normal rate and regular rhythm.     Pulses: Normal pulses.     Heart sounds: Normal heart  sounds. No murmur heard.   No friction rub. No gallop.  Pulmonary:     Effort: Pulmonary effort is normal. No respiratory distress.     Breath sounds: Normal breath sounds. No wheezing, rhonchi or rales.     Comments: Tenderness to palpation along the R midline and sternal body Chest:     Chest wall: Tenderness present.  Neurological:     Mental Status: She is alert and oriented to person, place, and time.  Psychiatric:        Mood and Affect: Mood normal.        Behavior: Behavior normal.     Assessment & Plan:   See Encounters Tab for problem based charting.  Patient discussed with Dr. Jimmye Norman

## 2020-09-18 NOTE — Telephone Encounter (Signed)
PT is requesting a call back .. she stated that Dr winters gave her   Lidocaine 1.8 % PTCH    at her appt yesterday but the insurance is not covering it and she is needing a new medicine or a PA
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
## 2020-09-18 NOTE — Telephone Encounter (Signed)
Information was called to [facility] for PA for Ztlido 1.8% Patches.  Approved 09/18/2020 thru 03/30/2021.  Case # [id number]. Attempts to call patient to inform her of the approval of the medication.  Unable to reach or leave a message.  Sander Nephew, RN 09/18/2020 4:22 PM.

## 2020-09-18 NOTE — Telephone Encounter (Signed)
Rose [name], Did you get a PA request for this?

## 2020-09-20 ENCOUNTER — Other Ambulatory Visit: Payer: Self-pay | Admitting: Internal Medicine

## 2020-09-20 DIAGNOSIS — E118 Type 2 diabetes mellitus with unspecified complications: Secondary | ICD-10-CM

## 2020-10-02 ENCOUNTER — Other Ambulatory Visit: Payer: Self-pay

## 2020-10-02 ENCOUNTER — Encounter: Payer: Self-pay | Admitting: Cardiovascular Disease

## 2020-10-02 ENCOUNTER — Ambulatory Visit (INDEPENDENT_AMBULATORY_CARE_PROVIDER_SITE_OTHER): Payer: Medicare Other | Admitting: Cardiovascular Disease

## 2020-10-02 VITALS — BP 112/70 | HR 66 | Resp 18 | Ht 63.0 in | Wt 159.6 lb

## 2020-10-02 DIAGNOSIS — I2510 Atherosclerotic heart disease of native coronary artery without angina pectoris: Secondary | ICD-10-CM

## 2020-10-02 DIAGNOSIS — I10 Essential (primary) hypertension: Secondary | ICD-10-CM | POA: Diagnosis not present

## 2020-10-02 DIAGNOSIS — I739 Peripheral vascular disease, unspecified: Secondary | ICD-10-CM

## 2020-10-02 DIAGNOSIS — E785 Hyperlipidemia, unspecified: Secondary | ICD-10-CM | POA: Diagnosis not present

## 2020-10-02 DIAGNOSIS — Z720 Tobacco use: Secondary | ICD-10-CM

## 2020-10-02 MED ORDER — CLOPIDOGREL BISULFATE 75 MG PO TABS: 75.0000 mg | ORAL_TABLET | Freq: Every day | ORAL | 3 refills | Status: DC

## 2020-10-02 NOTE — Patient Instructions (Signed)
Medication Instructions:  No changes *If you need a refill on your cardiac medications before your next appointment, please call your pharmacy*   Lab Work: None ordered If you have labs (blood work) drawn today and your tests are completely normal, you will receive your results only by: [place] (if you have MyChart) OR A paper copy in the mail If you have any lab test that is abnormal or we need to change your treatment, we will call you to review the results.   Testing/Procedures: Your physician has requested that you have an Aorta/Iliac Duplex in October. This will be take place at [place], [address].   No food after 11PM the night before.  Water is OK. (Don't drink liquids if you have been instructed not to for ANOTHER test). Take two Extra-Strength Gas-X capsules at bedtime the night before test.   Take an additional two Extra-Strength Gas-X capsules three (3) hours before the test or first thing in the morning.   Avoid foods that produce bowel gas, for 24 hours prior to exam (see below).   No breakfast, no chewing gum, no smoking or carbonated beverages. Patient may take morning medications with water. Come in for test at least 15 minutes early to register.  Your physician has requested that you have an ankle brachial index (ABI) in October.  During this test an ultrasound and blood pressure cuff are used to evaluate the arteries that supply the arms and legs with blood. Allow thirty minutes for this exam. There are no restrictions or special instructions. This will take place at [place], [address].   Follow-Up: At [facility], you and your health needs are our priority.  As part of our continuing mission to provide you with exceptional heart care, we have created designated Provider Care Teams.  These Care Teams include your primary Cardiologist (physician) and Advanced Practice Providers (APPs -  Physician Assistants and Nurse Practitioners) who  all work together to provide you with the care you need, when you need it.  We recommend signing up for the patient portal called "MyChart".  Sign up information is provided on this After Visit Summary.  MyChart is used to connect with patients for Virtual Visits (Telemedicine).  Patients are able to view lab/test results, encounter notes, upcoming appointments, etc.  Non-urgent messages can be sent to your provider as well.   To learn more about what you can do with MyChart, go to [email or web address].    Your next appointment:   6 month(s)  The format for your next appointment:   In Person  Provider:   [name], MD
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
## 2020-11-15 ENCOUNTER — Observation Stay (HOSPITAL_COMMUNITY)
Admission: EM | Admit: 2020-11-15 | Discharge: 2020-11-16 | Disposition: A | Discharge status: Home / Self Care | Payer: Medicare Other | Attending: Internal Medicine | Admitting: Internal Medicine

## 2020-11-15 ENCOUNTER — Observation Stay (HOSPITAL_COMMUNITY): Payer: Medicare Other

## 2020-11-15 ENCOUNTER — Other Ambulatory Visit: Payer: Self-pay

## 2020-11-15 ENCOUNTER — Emergency Department (HOSPITAL_COMMUNITY): Payer: Medicare Other

## 2020-11-15 DIAGNOSIS — E1140 Type 2 diabetes mellitus with diabetic neuropathy, unspecified: Secondary | ICD-10-CM | POA: Diagnosis not present

## 2020-11-15 DIAGNOSIS — M47814 Spondylosis without myelopathy or radiculopathy, thoracic region: Secondary | ICD-10-CM | POA: Diagnosis not present

## 2020-11-15 DIAGNOSIS — K760 Fatty (change of) liver, not elsewhere classified: Secondary | ICD-10-CM | POA: Diagnosis not present

## 2020-11-15 DIAGNOSIS — D649 Anemia, unspecified: Secondary | ICD-10-CM

## 2020-11-15 DIAGNOSIS — I2510 Atherosclerotic heart disease of native coronary artery without angina pectoris: Secondary | ICD-10-CM | POA: Insufficient documentation

## 2020-11-15 DIAGNOSIS — Z23 Encounter for immunization: Secondary | ICD-10-CM | POA: Diagnosis not present

## 2020-11-15 DIAGNOSIS — S8992XA Unspecified injury of left lower leg, initial encounter: Secondary | ICD-10-CM | POA: Diagnosis present

## 2020-11-15 DIAGNOSIS — R58 Hemorrhage, not elsewhere classified: Secondary | ICD-10-CM | POA: Diagnosis not present

## 2020-11-15 DIAGNOSIS — R000 Tachycardia, unspecified: Secondary | ICD-10-CM | POA: Diagnosis not present

## 2020-11-15 DIAGNOSIS — D509 Iron deficiency anemia, unspecified: Secondary | ICD-10-CM | POA: Diagnosis present

## 2020-11-15 DIAGNOSIS — I499 Cardiac arrhythmia, unspecified: Secondary | ICD-10-CM | POA: Diagnosis not present

## 2020-11-15 DIAGNOSIS — R6889 Other general symptoms and signs: Secondary | ICD-10-CM | POA: Diagnosis not present

## 2020-11-15 DIAGNOSIS — Z743 Need for continuous supervision: Secondary | ICD-10-CM | POA: Diagnosis not present

## 2020-11-15 DIAGNOSIS — E872 Acidosis, unspecified: Secondary | ICD-10-CM

## 2020-11-15 DIAGNOSIS — I10 Essential (primary) hypertension: Secondary | ICD-10-CM | POA: Insufficient documentation

## 2020-11-15 DIAGNOSIS — W2209XA Striking against other stationary object, initial encounter: Secondary | ICD-10-CM | POA: Insufficient documentation

## 2020-11-15 DIAGNOSIS — R160 Hepatomegaly, not elsewhere classified: Secondary | ICD-10-CM | POA: Diagnosis not present

## 2020-11-15 DIAGNOSIS — S81812A Laceration without foreign body, left lower leg, initial encounter: Secondary | ICD-10-CM | POA: Diagnosis not present

## 2020-11-15 DIAGNOSIS — T1490XA Injury, unspecified, initial encounter: Secondary | ICD-10-CM

## 2020-11-15 DIAGNOSIS — Z20822 Contact with and (suspected) exposure to covid-19: Secondary | ICD-10-CM | POA: Insufficient documentation

## 2020-11-15 DIAGNOSIS — J9811 Atelectasis: Secondary | ICD-10-CM | POA: Diagnosis not present

## 2020-11-15 DIAGNOSIS — B1920 Unspecified viral hepatitis C without hepatic coma: Secondary | ICD-10-CM

## 2020-11-15 DIAGNOSIS — Z7982 Long term (current) use of aspirin: Secondary | ICD-10-CM | POA: Insufficient documentation

## 2020-11-15 DIAGNOSIS — K8020 Calculus of gallbladder without cholecystitis without obstruction: Secondary | ICD-10-CM | POA: Diagnosis not present

## 2020-11-15 DIAGNOSIS — R404 Transient alteration of awareness: Secondary | ICD-10-CM | POA: Diagnosis not present

## 2020-11-15 DIAGNOSIS — Z79899 Other long term (current) drug therapy: Secondary | ICD-10-CM | POA: Insufficient documentation

## 2020-11-15 DIAGNOSIS — I959 Hypotension, unspecified: Secondary | ICD-10-CM | POA: Diagnosis not present

## 2020-11-15 DIAGNOSIS — Y900 Blood alcohol level of less than 20 mg/100 ml: Secondary | ICD-10-CM | POA: Diagnosis not present

## 2020-11-15 HISTORY — DX: Anemia, unspecified: D64.9

## 2020-11-15 LAB — I-STAT CHEM 8, ED
BUN: 12 mg/dL (ref 8–23)
Calcium, Ion: 0.98 mmol/L — ABNORMAL LOW (ref 1.15–1.40)
Chloride: 92 mmol/L — ABNORMAL LOW (ref 98–111)
Creatinine, Ser: 1.4 mg/dL — ABNORMAL HIGH (ref 0.44–1.00)
Glucose, Bld: 213 mg/dL — ABNORMAL HIGH (ref 70–99)
HCT: 28 % — ABNORMAL LOW (ref 36.0–46.0)
Hemoglobin: 9.5 g/dL — ABNORMAL LOW (ref 12.0–15.0)
Potassium: 3.9 mmol/L (ref 3.5–5.1)
Sodium: 127 mmol/L — ABNORMAL LOW (ref 135–145)
TCO2: 22 mmol/L (ref 22–32)

## 2020-11-15 LAB — CBC
HCT: 28.1 % — ABNORMAL LOW (ref 36.0–46.0)
HCT: 24.4 % — ABNORMAL LOW (ref 36.0–46.0)
Hemoglobin: 9.2 g/dL — ABNORMAL LOW (ref 12.0–15.0)
Hemoglobin: 8 g/dL — ABNORMAL LOW (ref 12.0–15.0)
MCH: 27.3 pg (ref 26.0–34.0)
MCH: 27.6 pg (ref 26.0–34.0)
MCHC: 32.7 g/dL (ref 30.0–36.0)
MCHC: 32.8 g/dL (ref 30.0–36.0)
MCV: 83.4 fL (ref 80.0–100.0)
MCV: 84.1 fL (ref 80.0–100.0)
Platelets: 231 10*3/uL (ref 150–400)
Platelets: 165 10*3/uL (ref 150–400)
RBC: 3.37 MIL/uL — ABNORMAL LOW (ref 3.87–5.11)
RBC: 2.9 MIL/uL — ABNORMAL LOW (ref 3.87–5.11)
RDW: 12.2 % (ref 11.5–15.5)
RDW: 12.4 % (ref 11.5–15.5)
WBC: 9.5 10*3/uL (ref 4.0–10.5)
WBC: 9.6 10*3/uL (ref 4.0–10.5)
nRBC: 0 % (ref 0.0–0.2)
nRBC: 0 % (ref 0.0–0.2)

## 2020-11-15 LAB — COMPREHENSIVE METABOLIC PANEL
ALT: 13 U/L (ref 0–44)
ALT: 12 U/L (ref 0–44)
AST: 19 U/L (ref 15–41)
AST: 21 U/L (ref 15–41)
Albumin: 3 g/dL — ABNORMAL LOW (ref 3.5–5.0)
Albumin: 2.7 g/dL — ABNORMAL LOW (ref 3.5–5.0)
Alkaline Phosphatase: 51 U/L (ref 38–126)
Alkaline Phosphatase: 44 U/L (ref 38–126)
Anion gap: 11 (ref 5–15)
Anion gap: 7 (ref 5–15)
BUN: 11 mg/dL (ref 8–23)
BUN: 11 mg/dL (ref 8–23)
CO2: 24 mmol/L (ref 22–32)
CO2: 24 mmol/L (ref 22–32)
Calcium: 8.8 mg/dL — ABNORMAL LOW (ref 8.9–10.3)
Calcium: 8.2 mg/dL — ABNORMAL LOW (ref 8.9–10.3)
Chloride: 91 mmol/L — ABNORMAL LOW (ref 98–111)
Chloride: 102 mmol/L (ref 98–111)
Creatinine, Ser: 1.47 mg/dL — ABNORMAL HIGH (ref 0.44–1.00)
Creatinine, Ser: 1.12 mg/dL — ABNORMAL HIGH (ref 0.44–1.00)
GFR, Estimated: 40 mL/min — ABNORMAL LOW (ref >60)
GFR, Estimated: 55 mL/min — ABNORMAL LOW (ref >60)
Glucose, Bld: 210 mg/dL — ABNORMAL HIGH (ref 70–99)
Glucose, Bld: 87 mg/dL (ref 70–99)
Potassium: 3.9 mmol/L (ref 3.5–5.1)
Potassium: 3.6 mmol/L (ref 3.5–5.1)
Sodium: 126 mmol/L — ABNORMAL LOW (ref 135–145)
Sodium: 133 mmol/L — ABNORMAL LOW (ref 135–145)
Total Bilirubin: 0.2 mg/dL — ABNORMAL LOW (ref 0.3–1.2)
Total Bilirubin: 0.6 mg/dL (ref 0.3–1.2)
Total Protein: 6 g/dL — ABNORMAL LOW (ref 6.5–8.1)
Total Protein: 5.5 g/dL — ABNORMAL LOW (ref 6.5–8.1)

## 2020-11-15 LAB — HEMOGLOBIN AND HEMATOCRIT, BLOOD
HCT: 25.8 % — ABNORMAL LOW (ref 36.0–46.0)
Hemoglobin: 8.4 g/dL — ABNORMAL LOW (ref 12.0–15.0)

## 2020-11-15 LAB — BLOOD PRODUCT ORDER (VERBAL) VERIFICATION

## 2020-11-15 LAB — ABO/RH: ABO/RH(D): O POS

## 2020-11-15 LAB — ETHANOL: Alcohol, Ethyl (B): <10 mg/dL (ref <10)

## 2020-11-15 LAB — IRON AND TIBC
Iron: 56 ug/dL (ref 28–170)
Saturation Ratios: 22 % (ref 10.4–31.8)
TIBC: 259 ug/dL (ref 250–450)
UIBC: 203 ug/dL

## 2020-11-15 LAB — RESP PANEL BY RT-PCR (FLU A&B, COVID) ARPGX2
Influenza A by PCR: NEGATIVE
Influenza B by PCR: NEGATIVE
SARS Coronavirus 2 by RT PCR: NEGATIVE

## 2020-11-15 LAB — PROTIME-INR
INR: 1.2 (ref 0.8–1.2)
Prothrombin Time: 15 seconds (ref 11.4–15.2)

## 2020-11-15 LAB — LACTIC ACID, PLASMA
Lactic Acid, Venous: 4.9 mmol/L (ref 0.5–1.9)
Lactic Acid, Venous: 2 mmol/L (ref 0.5–1.9)

## 2020-11-15 LAB — FERRITIN: Ferritin: 25 ng/mL (ref 11–307)

## 2020-11-15 LAB — POC OCCULT BLOOD, ED: Fecal Occult Bld: NEGATIVE

## 2020-11-15 MED ORDER — SODIUM CHLORIDE 0.9 % IV SOLN: Freq: Once | INTRAVENOUS | Status: AC

## 2020-11-15 MED ORDER — DULOXETINE HCL 20 MG PO CPEP
40.0000 mg | ORAL_CAPSULE | Freq: Every day | ORAL | Status: DC
  Administered 2020-11-15 – 2020-11-16 (×2): 40 mg via ORAL
  Filled 2020-11-15 (×2): qty 2

## 2020-11-15 MED ORDER — CARVEDILOL 6.25 MG PO TABS
6.2500 mg | ORAL_TABLET | Freq: Two times a day (BID) | ORAL | Status: DC
  Administered 2020-11-15 – 2020-11-16 (×3): 6.25 mg via ORAL
  Filled 2020-11-15: qty 1
  Filled 2020-11-15: qty 2
  Filled 2020-11-15: qty 1

## 2020-11-15 MED ORDER — SODIUM CHLORIDE 0.9 % IV BOLUS
1000.0000 mL | Freq: Once | INTRAVENOUS | Status: AC
Start: 2020-11-15 — End: 2020-11-15
  Administered 2020-11-15: 1000 mL via INTRAVENOUS

## 2020-11-15 MED ORDER — CLOPIDOGREL BISULFATE 75 MG PO TABS: 75.0000 mg | ORAL_TABLET | Freq: Every day | ORAL | Status: DC

## 2020-11-15 MED ORDER — ACETAMINOPHEN 650 MG RE SUPP: 650.0000 mg | Freq: Four times a day (QID) | RECTAL | Status: DC | PRN

## 2020-11-15 MED ORDER — SODIUM CHLORIDE 0.9% FLUSH
3.0000 mL | Freq: Two times a day (BID) | INTRAVENOUS | Status: DC
  Administered 2020-11-15 – 2020-11-16 (×2): 3 mL via INTRAVENOUS

## 2020-11-15 MED ORDER — HYDROCHLOROTHIAZIDE 25 MG PO TABS
25.0000 mg | ORAL_TABLET | Freq: Every day | ORAL | Status: DC
  Administered 2020-11-15 – 2020-11-16 (×2): 25 mg via ORAL
  Filled 2020-11-15 (×2): qty 1

## 2020-11-15 MED ORDER — CITALOPRAM HYDROBROMIDE 20 MG PO TABS
20.0000 mg | ORAL_TABLET | Freq: Every day | ORAL | Status: DC
  Administered 2020-11-15: 20 mg via ORAL
  Filled 2020-11-15: qty 1

## 2020-11-15 MED ORDER — SENNOSIDES-DOCUSATE SODIUM 8.6-50 MG PO TABS
2.0000 | ORAL_TABLET | Freq: Once | ORAL | Status: AC
  Administered 2020-11-15: 2 via ORAL
  Filled 2020-11-15: qty 2

## 2020-11-15 MED ORDER — METFORMIN HCL 500 MG PO TABS: 1000.0000 mg | ORAL_TABLET | Freq: Two times a day (BID) | ORAL | Status: DC

## 2020-11-15 MED ORDER — LIDOCAINE HCL (PF) 1 % IJ SOLN
10.0000 mL | Freq: Once | INTRAMUSCULAR | Status: AC
  Administered 2020-11-15: 10 mL
  Filled 2020-11-15: qty 10

## 2020-11-15 MED ORDER — ATORVASTATIN CALCIUM 40 MG PO TABS
40.0000 mg | ORAL_TABLET | Freq: Every day | ORAL | Status: DC
  Administered 2020-11-15 – 2020-11-16 (×2): 40 mg via ORAL
  Filled 2020-11-15 (×2): qty 1

## 2020-11-15 MED ORDER — ALBUTEROL SULFATE (2.5 MG/3ML) 0.083% IN NEBU: 3.0000 mL | INHALATION_SOLUTION | Freq: Four times a day (QID) | RESPIRATORY_TRACT | Status: DC | PRN

## 2020-11-15 MED ORDER — ACETAMINOPHEN 325 MG PO TABS: 650.0000 mg | ORAL_TABLET | Freq: Four times a day (QID) | ORAL | Status: DC | PRN

## 2020-11-15 MED ORDER — SODIUM CHLORIDE 0.9 % IV BOLUS
1000.0000 mL | Freq: Once | INTRAVENOUS | Status: AC
  Administered 2020-11-15: 1000 mL via INTRAVENOUS

## 2020-11-15 MED ORDER — SODIUM CHLORIDE 0.9 % IV SOLN
250.0000 mg | Freq: Once | INTRAVENOUS | Status: AC
  Administered 2020-11-15: 250 mg via INTRAVENOUS
  Filled 2020-11-15: qty 20

## 2020-11-15 MED ORDER — METFORMIN HCL 500 MG PO TABS
500.0000 mg | ORAL_TABLET | Freq: Two times a day (BID) | ORAL | Status: DC
  Administered 2020-11-15 – 2020-11-16 (×2): 500 mg via ORAL
  Filled 2020-11-15 (×2): qty 1

## 2020-11-15 MED ORDER — TETANUS-DIPHTH-ACELL PERTUSSIS 5-2.5-18.5 LF-MCG/0.5 IM SUSY
0.5000 mL | PREFILLED_SYRINGE | Freq: Once | INTRAMUSCULAR | Status: AC
  Administered 2020-11-15: 0.5 mL via INTRAMUSCULAR
  Filled 2020-11-15: qty 0.5

## 2020-11-15 MED ORDER — ASPIRIN EC 81 MG PO TBEC
81.0000 mg | DELAYED_RELEASE_TABLET | Freq: Every day | ORAL | Status: DC
  Administered 2020-11-15 – 2020-11-16 (×2): 81 mg via ORAL
  Filled 2020-11-15 (×2): qty 1

## 2020-11-15 MED ORDER — NICOTINE 14 MG/24HR TD PT24
14.0000 mg | MEDICATED_PATCH | Freq: Every day | TRANSDERMAL | Status: DC
  Administered 2020-11-15 – 2020-11-16 (×2): 14 mg via TRANSDERMAL
  Filled 2020-11-15 (×2): qty 1

## 2020-11-15 NOTE — ED Provider Notes (Signed)
[facility] EMERGENCY DEPARTMENT Provider Note   CSN: [id number] Arrival date & time: 11/15/20  [id number]     History No chief complaint on file.   Rose [name] is a 65 y.o. female.  Patient to ED by EMS for evaluation of laceration injury to left lower leg and hypotension. EMS reporting systolic of 80 on scene, increased to 130's without intervention. She denies chest pain or SOB/DOE. She hit her leg on the corner of the bed and fell to the floor which is when symptoms started. EMS was called by the patient.  She denies hitting her head. Per EMS, decreased responsiveness on their arrival. No vomiting. She denies anticoagulation, on aspirin only.   The history is provided by the patient. No language interpreter was used.      No past medical history on file.  There are no problems to display for this patient.   OB History   No obstetric history on file.     No family history on file.     Home Medications Prior to Admission medications   Not on File    Allergies    Hydrocodone-acetaminophen  Review of Systems   Review of Systems  Constitutional:  Positive for diaphoresis.  HENT: Negative.    Eyes:  Negative for visual disturbance.  Respiratory:  Negative for shortness of breath.   Cardiovascular:  Negative for chest pain.  Gastrointestinal:  Negative for abdominal pain.  Musculoskeletal:        See HPI.  Skin:  Positive for wound.  Neurological:  Positive for light-headedness. Negative for syncope.   Physical Exam Updated Vital Signs BP 125/70   Pulse 91   Temp (!) 96.6 F (35.9 C) (Temporal)   Resp 10   Ht '5\' 3"'$  (1.6 m)   Wt 79.4 kg   SpO2 100%   BMI 31.00 kg/m   Physical Exam Vitals and nursing note reviewed.  Constitutional:      General: She is not in acute distress.    Appearance: She is well-developed. She is obese.  HENT:     Head: Normocephalic.     Nose: Nose normal.     Mouth/Throat:     Mouth: Mucous membranes  are moist.  Eyes:     Extraocular Movements: Extraocular movements intact.     Comments: Conjunctival pallor.  Cardiovascular:     Rate and Rhythm: Regular rhythm. Tachycardia present.     Heart sounds: No murmur heard. Pulmonary:     Effort: Pulmonary effort is normal.     Breath sounds: Normal breath sounds. No wheezing, rhonchi or rales.  Abdominal:     General: Bowel sounds are normal.     Palpations: Abdomen is soft.     Tenderness: There is no abdominal tenderness. There is no guarding or rebound.  Genitourinary:    Rectum: Guaiac result negative.  Musculoskeletal:        General: Normal range of motion.     Cervical back: Normal range of motion and neck supple.     Comments: 6 cm full thickness laceration to left anteromedial distal lower leg. No active or pulsating bleeding.  Skin:    General: Skin is warm and dry.  Neurological:     General: No focal deficit present.     Mental Status: She is alert and oriented to person, place, and time.    ED Results / Procedures / Treatments   Labs (all labs ordered are listed, but only abnormal results  are displayed) Labs Reviewed  CBC - Abnormal; Notable for the following components:      Result Value   RBC 3.37 (*)    Hemoglobin 9.2 (*)    HCT 28.1 (*)    All other components within normal limits  I-STAT CHEM 8, ED - Abnormal; Notable for the following components:   Sodium 127 (*)    Chloride 92 (*)    Creatinine, Ser 1.40 (*)    Glucose, Bld 213 (*)    Calcium, Ion 0.98 (*)    Hemoglobin 9.5 (*)    HCT 28.0 (*)    All other components within normal limits  RESP PANEL BY RT-PCR (FLU A&B, COVID) ARPGX2  COMPREHENSIVE METABOLIC PANEL  ETHANOL  URINALYSIS, ROUTINE W REFLEX MICROSCOPIC  LACTIC ACID, PLASMA  PROTIME-INR  POC OCCULT BLOOD, ED  SAMPLE TO BLOOD BANK  TYPE AND SCREEN   Results for orders placed or performed during the hospital encounter of 11/15/20  Resp Panel by RT-PCR (Flu A&B, Covid) Nasopharyngeal  Swab   Specimen: Nasopharyngeal Swab; Nasopharyngeal(NP) swabs in vial transport medium  Result Value Ref Range   SARS Coronavirus 2 by RT PCR NEGATIVE NEGATIVE   Influenza A by PCR NEGATIVE NEGATIVE   Influenza B by PCR NEGATIVE NEGATIVE  Comprehensive metabolic panel  Result Value Ref Range   Sodium 126 (L) 135 - 145 mmol/L   Potassium 3.9 3.5 - 5.1 mmol/L   Chloride 91 (L) 98 - 111 mmol/L   CO2 24 22 - 32 mmol/L   Glucose, Bld 210 (H) 70 - 99 mg/dL   BUN 11 8 - 23 mg/dL   Creatinine, Ser 1.47 (H) 0.44 - 1.00 mg/dL   Calcium 8.8 (L) 8.9 - 10.3 mg/dL   Total Protein 6.0 (L) 6.5 - 8.1 g/dL   Albumin 3.0 (L) 3.5 - 5.0 g/dL   AST 19 15 - 41 U/L   ALT 13 0 - 44 U/L   Alkaline Phosphatase 51 38 - 126 U/L   Total Bilirubin 0.2 (L) 0.3 - 1.2 mg/dL   GFR, Estimated 40 (L) >60 mL/min   Anion gap 11 5 - 15  CBC  Result Value Ref Range   WBC 9.5 4.0 - 10.5 K/uL   RBC 3.37 (L) 3.87 - 5.11 MIL/uL   Hemoglobin 9.2 (L) 12.0 - 15.0 g/dL   HCT 28.1 (L) 36.0 - 46.0 %   MCV 83.4 80.0 - 100.0 fL   MCH 27.3 26.0 - 34.0 pg   MCHC 32.7 30.0 - 36.0 g/dL   RDW 12.2 11.5 - 15.5 %   Platelets 231 150 - 400 K/uL   nRBC 0.0 0.0 - 0.2 %  Ethanol  Result Value Ref Range   Alcohol, Ethyl (B) <10 <10 mg/dL  Lactic acid, plasma  Result Value Ref Range   Lactic Acid, Venous 4.9 (HH) 0.5 - 1.9 mmol/L  Protime-INR  Result Value Ref Range   Prothrombin Time 15.0 11.4 - 15.2 seconds   INR 1.2 0.8 - 1.2  Hemoglobin and hematocrit, blood  Result Value Ref Range   Hemoglobin 8.4 (L) 12.0 - 15.0 g/dL   HCT 25.8 (L) 36.0 - 46.0 %  I-Stat Chem 8, ED  Result Value Ref Range   Sodium 127 (L) 135 - 145 mmol/L   Potassium 3.9 3.5 - 5.1 mmol/L   Chloride 92 (L) 98 - 111 mmol/L   BUN 12 8 - 23 mg/dL   Creatinine, Ser 1.40 (H) 0.44 - 1.00 mg/dL  Glucose, Bld 213 (H) 70 - 99 mg/dL   Calcium, Ion 0.98 (L) 1.15 - 1.40 mmol/L   TCO2 22 22 - 32 mmol/L   Hemoglobin 9.5 (L) 12.0 - 15.0 g/dL   HCT 28.0 (L) 36.0 -  46.0 %  POC occult blood, ED  Result Value Ref Range   Fecal Occult Bld NEGATIVE NEGATIVE  Type and screen [place]  Result Value Ref Range   ABO/RH(D) O POS    Antibody Screen NEG    Sample Expiration 11/18/2020,2359    Unit Number [id number]    Blood Component Type RED CELLS,LR    Unit division 00    Status of Unit ISSUED    Unit tag comment EMERGENCY RELEASE    Transfusion Status OK TO TRANSFUSE    Crossmatch Result COMPATIBLE   ABO/Rh  Result Value Ref Range   ABO/RH(D)      O POS Performed at [facility] [address] [place], [place] [address]   BPAM [facility]  Result Value Ref Range   ISSUE DATE / TIME [id number]    Blood Product Unit Number [id number]    Unit Type and Rh 5100    Blood Product Expiration Date [id number]     EKG None  Radiology No results found.  Procedures .Marland KitchenLaceration Repair  Date/Time: 11/15/2020 6:49 AM Performed by: Charlann Lange, PA-C Authorized by: Charlann Lange, PA-C   Consent:    Consent obtained:  Verbal   Consent given by:  Patient Universal protocol:    Procedure explained and questions answered to patient or proxy's satisfaction: yes     Immediately prior to procedure, a time out was called: yes     Patient identity confirmed:  Verbally with patient Anesthesia:    Anesthesia method:  Local infiltration   Local anesthetic:  Lidocaine 1% w/o epi Laceration details:    Location:  Leg   Leg location:  L lower leg   Length (cm):  6 Pre-procedure details:    Preparation:  Patient was prepped and draped in usual sterile fashion and imaging obtained to evaluate for foreign bodies Treatment:    Area cleansed with:  Povidone-iodine and saline   Amount of cleaning:  Extensive   Irrigation solution:  Sterile saline   Debridement:  Minimal Skin repair:    Repair method:  Staples   Number of staples:  12 Approximation:    Approximation:  Close Repair type:    Repair type:   Simple Post-procedure details:    Dressing:  Antibiotic ointment and non-adherent dressing   Medications Ordered in ED Medications - No data to display  ED Course  I have reviewed the triage vital signs and the nursing notes.  Pertinent labs & imaging results that were available during my care of the patient were reviewed by me and considered in my medical decision making (see chart for details).    MDM Rules/Calculators/A&P                         CRITICAL CARE Performed by: Dewaine Oats   Total critical care time: 45 minutes  Critical care time was exclusive of separately billable procedures and treating other patients.  Critical care was necessary to treat or prevent imminent or life-threatening deterioration.  Critical care was time spent personally by me on the following activities: development of treatment plan with patient and/or surrogate as well as nursing, discussions with consultants, evaluation of patient's response to  treatment, examination of patient, obtaining history from patient or surrogate, ordering and performing treatments and interventions, ordering and review of laboratory studies, ordering and review of radiographic studies, pulse oximetry and re-evaluation of patient's condition.   Patient to ED by EMS for leg injury, found to be hypotensive on EMS arrival. No CP, SOB. Not anticoagulated.   Laceration noted to be cutaneous, full thickness, without involvement of deep tissue or vascular structures. Lac repair as per above note.   She appears pale, tachy, with the noted hypotension which continues in the ED. No IV rescusitation by EMS. IV started, 1 U emergency blood provided, fluids running, BP improves easily.   The patient has no confusion or decreased responsiveness on multiple rechecks. She is awake and talking during lac repair. Tachycardia resolves.   Initial HGB 9.5. She is hemoccult negative. All imaging is negative. No CT studies felt indicated.  Repeat CBC and lactic acid (initial level of 4.5) pending.   Patient care signed out to oncoming provider for reevaluation of CBC/Hgb, lactic acid and ambulation prior to consideration of discharge home.  Final Clinical Impression(s) / ED Diagnoses Final diagnoses:  Trauma   Hypotension Left lower leg laceration.   Rx / DC Orders ED Discharge Orders     None        Charlann Lange, Hershal Coria 11/15/20 [id number]    Ezequiel Essex, MD 11/16/20 [phone]

## 2020-11-15 NOTE — ED Notes (Signed)
Pt states that she has been having dark tarry stools x few days.

## 2020-11-15 NOTE — ED Provider Notes (Signed)
Assumed care of patient from Rose Lange, PA-C at 0700.   Patient is a 65 year old female presenting as a level 1 trauma after she hit her leg on the corner of her bed and fell to the floor. On arrival she was hypotensive, pale, tachycardic on arrival. She received 1 unit of emergency released blood. Laceration was repaired in the ED by previous team. Initial hemoglobin was 9.2 and repeat hemoglobin was 8.4. Hemoccult was negative. Her initial lactic acid was 4.9 that downtrended to 2.0. She was hyponatremic. Plan for admission for observation due to hyponatremia and anemia.  Plan from previous team: -Admit to internal medicine service  Patient admitted to internal medicine service. No acute events under my care. Patient is resting comfortably.   The plan for this patient was discussed with Dr. Karle Starch, who voiced agreement and who oversaw evaluation and treatment of this patient.     Doretha Sou, MD 11/15/20 1029    Truddie Hidden, MD 11/15/20 [phone]

## 2020-11-15 NOTE — Progress Notes (Signed)
Patient ID: Rose [name], female   DOB: 1955-04-16, 65 y.o.   MRN: [id number] 65 yof with fall where she has left lower leg laceration. Has MMP on asa, some baseline anemia in June.  She also notes changes in stools. Was hypotensive. Wound not really bleeding except for some minor venous ooze and is superficial.  Doubt that this is all related to laceration.  Discussed with Dr Wyvonnia Dusky and will call back if needed, needs further eval and laceration repair in ER.

## 2020-11-15 NOTE — Progress Notes (Signed)
Orthopedic Tech Progress Note Patient Details:  Rose [name] 03/31/1873 [id number] Level 1 trauma Patient ID: Rose [name], female   DOB: 03/31/1873, 65 y.o.   MRN: [id number]  Rose [name] 11/15/2020, 2:31 AM

## 2020-11-15 NOTE — ED Notes (Addendum)
Trauma Response Nurse Note-  Reason for Call / Reason for Trauma activation:   - Level 1 trauma activation due to laceration on the left leg and initially hypotensive.  Initial Focused Assessment (If applicable, or please see trauma documentation):  -Pt is alert and oriented. Pt noted to have pressure dressing to the right leg. No airway obstruction noted. Pt noted to be pale.  Interventions:  - Bedside x-rays completed. IV obtained and blood work sent to the lab. 1unit of emergent PRBC given along with 1048m of warmed NS. Room temperature increased and multiple warm blankets provided. Repeat temperature after warming measures increased. Please see charting.   Plan of Care as of this note:  - Waiting on blood work, providers assessed laceration and redressed it. Hemoccult sample obtained by provider, waiting for results.   Event Summary:   -Pt came in as a level 1 trauma, hypotensive, after a laceration to the left lower leg. On arrival pt noted to be hypotensive and pale on arrival, stating she has had black stool. Pt received 1 unit emergent PRBC and 10058mof NS via bolus. Pt currently on cardiac, bp and pulse ox monitoring. As per provider, no CT scans to be completed at this time. This TRN attempted x3 for a second PIV without success. Provider notified.   The Following (if applicable):    -MD notified: Dr RaWyvonnia DuskyEDIthacaand Dr. WaDonne HazelTrauma)    -TRN arrival Time: TRN at bedside prior to patients arrival.

## 2020-11-15 NOTE — Progress Notes (Signed)
Chaplain responded to Level 1 Trauma.  Chaplain spoke w/ pt at bedside establishing a demeanor of care and concern.  Pt explained she had hit her leg on the bed when she had gotten up to go to the bathroom.  Pt denied needing any assistance from [place].  [place]

## 2020-11-15 NOTE — H&P (Addendum)
Date: 11/15/2020               Patient Name:  Rose [name] MRN: [id number]  DOB: Apr 21, 1955 Age / Sex: 65 y.o., female   PCP: [name], MD         Medical Service: Internal Medicine Teaching Service         Attending Physician: Dr. Lucious Groves, DO    First Contact: Idamae Schuller, MD Pager: Teodora Medici [phone]  Second Contact: Harvie Heck, MD Pager: Michel Bickers [phone]       After Hours (After 5p/  First Contact Pager: [phone]  weekends / holidays): Second Contact Pager: [phone]   SUBJECTIVE  Chief Complaint: fall  History of Present Illness: Rose [name] is a 65 y.o. female with a pertinent PMH of hypertension, peripheral artery disease, coronary artery disease, type II diabetes mellitus complicated by diabetic neuropathy, chronic renal insufficiency, chronic back pain, prior history of polysubstance use currently on methadone who presents to [facility] with a left lower extremity laceration.   Patient notes that she was in her usual state of health until yesterday evening when she got up to go use the bathroom. She recently rearranged the furniture in her room and forgot to push the railing of her bed. She hit her leg on the railing resulting in the laceration. She tried to get the bleeding to stop with pressure to the area for 10-15 minutes; however, was unable to do so for which she called EMS.  She denies any recent illness, chest pain, shortness of breath, palpitations, abdominal pain. She endorses chronic constipation with her last bowel movement being three days ago. She does note having dark stools a few weeks ago; however, her most recent bowel movement was brown in color.    Patient was noted to be diaphoretic and poorly responsive to verbal stimuli on EMS arrival and noted to have a left lower leg laceration that was spurting blood.  Pressure bandage applied by EMS.  He she was noted to be hypotensive and received 1 L LR in the ED.  Patient also received 1 unit of emergent packed red  blood cells.  Pressures have since been stable.  However, following the blood transfusion, noted to have an appropriate response in hemoglobin.  Given concern for melena, patient admitted for further management.  Medications: No current facility-administered medications on file prior to encounter.   Current Outpatient Medications on File Prior to Encounter  Medication Sig Dispense Refill   albuterol (VENTOLIN HFA) 108 (90 Base) MCG/ACT inhaler Inhale 2 puffs into the lungs every 6 (six) hours as needed for wheezing.     aspirin EC 81 MG tablet Take 81 mg by mouth daily. Swallow whole.     atorvastatin (LIPITOR) 40 MG tablet Take 40 mg by mouth daily.     carvedilol (COREG) 6.25 MG tablet Take 6.25 mg by mouth 2 (two) times daily.     cetirizine (ZYRTEC) 10 MG tablet Take 10 mg by mouth daily.     citalopram (CELEXA) 20 MG tablet Take 20 mg by mouth at bedtime.     clopidogrel (PLAVIX) 75 MG tablet Take 75 mg by mouth daily.     cyclobenzaprine (FLEXERIL) 5 MG tablet Take 5 mg by mouth 3 (three) times daily as needed for muscle spasms.     diclofenac Sodium (VOLTAREN) 1 % GEL Apply 2 g topically 4 (four) times daily as needed (joint pain).     DULoxetine (CYMBALTA) 20 MG capsule Take  40 mg by mouth daily.     fluticasone (FLONASE) 50 MCG/ACT nasal spray Place 1 spray into both nostrils daily as needed for allergies.     hydrochlorothiazide (HYDRODIURIL) 25 MG tablet Take 25 mg by mouth daily.     metFORMIN (GLUCOPHAGE) 1000 MG tablet Take 1,000 mg by mouth 2 (two) times daily.     Multiple Vitamins-Minerals (CENTRUM WOMEN) TABS Take 1 tablet by mouth daily.     naproxen sodium (ALEVE) 220 MG tablet Take 220 mg by mouth daily as needed (pain).      Past Medical History:  No past medical history on file.  Social:  Patient reports that she lives by herself in [place] and her granddaughter checks on her infrequently.  She is independent in her ADLs.  She has a history of prior polysubstance  use and reports quitting about 7 years ago.  She is currently on methadone.  Family History: No family history on file.  Allergies: Allergies as of 11/15/2020 - Review Complete 11/15/2020  Allergen Reaction Noted   Hydrocodone-acetaminophen  11/15/2020    Review of Systems: A complete ROS was negative except as per HPI.   OBJECTIVE:  Physical Exam: Blood pressure (!) 153/73, pulse 100, temperature (!) 96.6 F (35.9 C), temperature source Temporal, resp. rate 18, height '5\' 3"'$  (1.6 m), weight 79.4 kg, SpO2 100 %. Physical Exam  Constitutional: Appears well-developed and well-nourished. No distress.  HENT: Normocephalic and atraumatic, EOMI, moist mucous membranes Cardiovascular: Mildly tachycardic, regular rhythm, S1 and S2 present, no murmurs, rubs, gallops.  Distal pulses intact Respiratory: No respiratory distress, no accessory muscle use. Lungs are clear to auscultation bilaterally. GI: Distended, soft, nontender to palpation, normal bowel sounds Musculoskeletal: Normal bulk and tone.  No peripheral edema noted.  Left lower extremity wrapped in bandage Neurological: Is alert and oriented x4, no apparent focal deficits noted. Skin: Warm and dry.  No rash, erythema, lesions noted. Psychiatric: Normal mood and affect. Behavior is normal.   Pertinent Labs: CBC    Component Value Date/Time   WBC 9.5 11/15/2020 0229   RBC 3.37 (L) 11/15/2020 0229   HGB 8.4 (L) 11/15/2020 0619   HCT 25.8 (L) 11/15/2020 0619   PLT 231 11/15/2020 0229   MCV 83.4 11/15/2020 0229   MCH 27.3 11/15/2020 0229   MCHC 32.7 11/15/2020 0229   RDW 12.2 11/15/2020 0229     CMP     Component Value Date/Time   NA 127 (L) 11/15/2020 0250   K 3.9 11/15/2020 0250   CL 92 (L) 11/15/2020 0250   CO2 24 11/15/2020 0229   GLUCOSE 213 (H) 11/15/2020 0250   BUN 12 11/15/2020 0250   CREATININE 1.40 (H) 11/15/2020 0250   CALCIUM 8.8 (L) 11/15/2020 0229   PROT 6.0 (L) 11/15/2020 0229   ALBUMIN 3.0 (L)  11/15/2020 0229   AST 19 11/15/2020 0229   ALT 13 11/15/2020 0229   ALKPHOS 51 11/15/2020 0229   BILITOT 0.2 (L) 11/15/2020 0229   GFRNONAA 40 (L) 11/15/2020 0229    Pertinent Imaging: DG Pelvis Portable  Result Date: 11/15/2020 CLINICAL DATA:  Status post left leg laceration. EXAM: PORTABLE PELVIS 1-2 VIEWS COMPARISON:  November 05, 2017 FINDINGS: There is no evidence of pelvic fracture or diastasis. No pelvic bone lesions are seen. A spinal stimulator and associated stimulator wire are seen overlying the right iliac crest. IMPRESSION: Stable exam without an acute abnormality seen within the pelvis. Electronically Signed   By: Joyce Gross.D.  On: 11/15/2020 02:57   DG Chest Port 1 View  Result Date: 11/15/2020 CLINICAL DATA:  Left leg laceration. EXAM: PORTABLE CHEST 1 VIEW COMPARISON:  September 05, 2020 FINDINGS: Very mild atelectasis is suspected along the bilateral infrahilar regions. There is no evidence of an acute infiltrate, pleural effusion or pneumothorax. The heart size and mediastinal contours are within normal limits. Degenerative changes are noted throughout the thoracic spine. IMPRESSION: No acute cardiopulmonary disease. Electronically Signed   By: Virgina Norfolk M.D.   On: 11/15/2020 02:59   DG Tibia/Fibula Left Port  Result Date: 11/15/2020 CLINICAL DATA:  Left leg laceration. EXAM: PORTABLE LEFT TIBIA AND FIBULA - 2 VIEW COMPARISON:  None. FINDINGS: There is no evidence of an acute fracture or dislocation. No lytic or blastic lesions are identified. There is no evidence of cortical destruction or acute periosteal reaction. Mild diffuse soft tissue swelling is noted with an ill-defined, superficial soft tissue defect seen adjacent to the medial aspect of the distal left tibial shaft. No radiopaque soft tissue foreign bodies are seen. IMPRESSION: 1. Superficial laceration involving the medial aspect of the distal left leg with an acute osseous abnormality. Electronically  Signed   By: Virgina Norfolk M.D.   On: 11/15/2020 03:04    EKG: personally reviewed my interpretation is sinus tachycardia  ASSESSMENT & PLAN:  Assessment: Active Problems:   Symptomatic anemia   Kenise L Calahan is a 65 y.o. with pertinent PMH of coronary artery disease, peripheral artery disease, type 2 diabetes mellitus, hyperlipidemia, chronic back pain, prior history of polysubstance use currently on methadone who presented with left lower leg laceration and admit for anemia on hospital day 0  Plan: #Normocytic anemia Baseline hemoglobin around 10; percent with hemoglobin 9.2 in setting of acute bleed secondary to laceration.  She received 1 unit of PRBC with repeat H&H 8.4.  She initiated lactic acid of 4.9 that improved to 2.0 with fluid resuscitation. She has admitted for concerns of ongoing bleed.  Per chart review, patient endorsed having melena over the past several days.  However, on questioning patient endorses having melena several weeks prior.  Her last bowel movement was 3 days ago with normal brown stools.  She denies any abdominal pain but does note ongoing abdominal distention which is at baseline.  She denies any nausea or vomiting at this time.  She denies any hematochezia or pain with defecation.  She is otherwise hemodynamically stable without signs of ongoing bleed.  -Repeat CBC at noon -Iron studies -If hemoglobin remains stable and no further signs of bleed, patient can likely discharge to home with follow-up in clinic - If drop in Hb, will consult GI for further evaluation  #Left lower leg laceration #Fall Patient presented with a left lower leg laceration following a fall.  Noted to have a 6 cm full-thickness laceration to the left anterior medial distal lower leg without active or pulsating bleeding.  She received 12 staples to the left lower leg with antibiotic ointment and nonadherent dressing.  She denies any head trauma or trauma to any other part of her  body.  She denies any prodromal symptoms prior to the fall.  Fall was likely mechanical in nature.  Left lower leg is currently bandaged.  No further signs of bleeding. -PT/OT eval -Wound care  #Coronary artery disease #Peripheral artery disease Patient has history of coronary artery disease status post drug-eluting stent to LAD in 2021.  Patient advised to continue antiplatelet therapy for at least 6 months.  Patient reports that she is mostly adherent to her medications. - Continue aspirin 81 mg daily -Continue atorvastatin 40 mg daily  #Type 2 diabetes mellitus complicated by diabetic nephropathy Last hemoglobin A1c of 6.3 in April 2022.  Patient is currently on metformin 1000 mg twice daily. -Continue current regimen - CBG monitoring  #Chronic renal insufficiency Baseline sCr ~1.4; currently appears to be at baseline. - Continue to monitor    Best Practice: Diet: Cardiac diet and Diabetic diet IVF: Fluids: None, Rate: None VTE: SCDs Start: 11/15/20 0815 Code: Full Status: Observation with expected length of stay less than 2 midnights. Anticipated Discharge Location: Home Barriers to Discharge: Medical stability, pending PT/OT recs  Signature: Harvie Heck, MD Internal Medicine Resident, PGY-3 [facility]  Pager: [phone] 8:19 AM, 11/15/2020   Please contact the on call pager after 5 pm and on weekends at [phone].
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
## 2020-11-15 NOTE — Progress Notes (Signed)
OT Cancellation Note  Patient Details Name: Rose [name] MRN: [id number] DOB: 11/17/1955   Cancelled Treatment:    Reason Eval/Treat Not Completed: OT screened, no needs identified, will sign off  Devina Bezold,HILLARY 11/15/2020, 10:53 AM Maurie Boettcher, OT/L   Acute OT Clinical Specialist Acute Rehabilitation Services Pager [phone] Office [phone]

## 2020-11-15 NOTE — Evaluation (Signed)
Physical Therapy Evaluation Patient Details Name: Rose [name] MRN: [id number] DOB: July 12, 1955 Today's Date: 11/15/2020   History of Present Illness  Pt is a 65 y/o female admitted 8/18 secondary to LLE laceration and anemia. Recieved 1 unit of PRBC upon admission. PMH includes HTN, PAD, CAD, DM, and polysubstance abuse.  Clinical Impression  Patient evaluated by Physical Therapy with no further acute PT needs identified. All education has been completed and the patient has no further questions. Pt overall steady with use of HHA during mobility tasks; reports she normally uses cane at baseline. No overt LOB noted and required min guard for safety only. Reports she is at baseline and does not feel she needs further acute PT at d/c. Reports her daughter and granddaughter can check on her as needed. See below for any follow-up Physical Therapy or equipment needs. PT is signing off. Thank you for this referral. If needs change, please re-consult.      Follow Up Recommendations No PT follow up    Equipment Recommendations  None recommended by PT    Recommendations for Other Services       Precautions / Restrictions Precautions Precautions: None Restrictions Weight Bearing Restrictions: No      Mobility  Bed Mobility Overal bed mobility: Needs Assistance Bed Mobility: Supine to Sit;Sit to Supine     Supine to sit: Supervision Sit to supine: Supervision   General bed mobility comments: Supervision for safety to perform bed mobility on stretcher.    Transfers Overall transfer level: Needs assistance Equipment used: 1 person hand held assist Transfers: Sit to/from Stand Sit to Stand: Min guard         General transfer comment: Min guard for safety to stand from taller stretcher. Used HHA to simulate cane  Ambulation/Gait Ambulation/Gait assistance: Counsellor (Feet): 30 Feet Assistive device: 1 person hand held assist Gait Pattern/deviations:  Step-through pattern;Trunk flexed Gait velocity: Decreased   General Gait Details: Pt with trunk flexion throughout gait, but reports this is baseline. USed HHA to simulate use of cane. No overt LOB noted and pt reports she feels she is at baseline. Min guard for safety; no physical assist required.  Stairs            Wheelchair Mobility    Modified Rankin (Stroke Patients Only)       Balance Overall balance assessment: Mild deficits observed, not formally tested                                           Pertinent Vitals/Pain Pain Assessment: Faces Faces Pain Scale: Hurts a little bit Pain Location: LLE Pain Descriptors / Indicators: Grimacing;Guarding Pain Intervention(s): Limited activity within patient's tolerance;Monitored during session;Repositioned    Home Living Family/patient expects to be discharged to:: Private residence Living Arrangements: Alone Available Help at Discharge: Family;Available PRN/intermittently Type of Home: Apartment Home Access: Elevator     Home Layout: One level Home Equipment: Cane - single point;Shower seat;Bedside commode;Walker - 2 wheels      Prior Function Level of Independence: Independent with assistive device(s)         Comments: Uses cane for ambulation     Hand Dominance        Extremity/Trunk Assessment   Upper Extremity Assessment Upper Extremity Assessment: Overall WFL for tasks assessed    Lower Extremity Assessment Lower Extremity Assessment: LLE deficits/detail LLE  Deficits / Details: LLE wrapped at shin at laceration site    Cervical / Trunk Assessment Cervical / Trunk Assessment: Kyphotic  Communication   Communication: No difficulties  Cognition Arousal/Alertness: Awake/alert Behavior During Therapy: WFL for tasks assessed/performed Overall Cognitive Status: Within Functional Limits for tasks assessed                                        General Comments       Exercises     Assessment/Plan    PT Assessment Patent does not need any further PT services  PT Problem List         PT Treatment Interventions      PT Goals (Current goals can be found in the Care Plan section)  Acute Rehab PT Goals Patient Stated Goal: to go home PT Goal Formulation: With patient Time For Goal Achievement: 11/15/20 Potential to Achieve Goals: Good    Frequency     Barriers to discharge        Co-evaluation               AM-PAC PT "6 Clicks" Mobility  Outcome Measure Help needed turning from your back to your side while in a flat bed without using bedrails?: None Help needed moving from lying on your back to sitting on the side of a flat bed without using bedrails?: None Help needed moving to and from a bed to a chair (including a wheelchair)?: A Little Help needed standing up from a chair using your arms (e.g., wheelchair or bedside chair)?: A Little Help needed to walk in hospital room?: A Little Help needed climbing 3-5 steps with a railing? : A Little 6 Click Score: 20    End of Session Equipment Utilized During Treatment: Gait belt Activity Tolerance: Patient tolerated treatment well Patient left: in bed;with call bell/phone within reach (on stretcher in ED) Nurse Communication: Mobility status PT Visit Diagnosis: Other abnormalities of gait and mobility (R26.89)    Time: [id number] PT Time Calculation (min) (ACUTE ONLY): 15 min   Charges:   PT Evaluation $PT Eval Low Complexity: 1 Low          Lou Miner, DPT  Acute Rehabilitation Services  Pager: [phone] Office: [phone]   Rudean Hitt 11/15/2020, 11:12 AM

## 2020-11-15 NOTE — ED Triage Notes (Signed)
Pt was diaphoretic and barely responsive to verbal stimuli upon EMS arrival. Laceration to left lower leg (which soaked multiple towels) spurting blood, pressure bandage applied by EMS.   Hypotensive with EMS. [id number] with EMS. Unable to get Iv access.  Pt hit the corner of bed and started bleeding (pt tried attending but unable to control bleeding).

## 2020-11-16 ENCOUNTER — Encounter: Payer: Self-pay | Admitting: Gastroenterology

## 2020-11-16 DIAGNOSIS — S81812A Laceration without foreign body, left lower leg, initial encounter: Secondary | ICD-10-CM | POA: Diagnosis not present

## 2020-11-16 LAB — BASIC METABOLIC PANEL
Anion gap: 8 (ref 5–15)
BUN: 10 mg/dL (ref 8–23)
CO2: 24 mmol/L (ref 22–32)
Calcium: 8.4 mg/dL — ABNORMAL LOW (ref 8.9–10.3)
Chloride: 100 mmol/L (ref 98–111)
Creatinine, Ser: 1.05 mg/dL — ABNORMAL HIGH (ref 0.44–1.00)
GFR, Estimated: 60 mL/min — ABNORMAL LOW (ref >60)
Glucose, Bld: 142 mg/dL — ABNORMAL HIGH (ref 70–99)
Potassium: 3.8 mmol/L (ref 3.5–5.1)
Sodium: 132 mmol/L — ABNORMAL LOW (ref 135–145)

## 2020-11-16 LAB — TYPE AND SCREEN
ABO/RH(D): O POS
Antibody Screen: NEGATIVE
Unit division: 0

## 2020-11-16 LAB — BPAM RBC
Blood Product Expiration Date: 202209032359
ISSUE DATE / TIME: 202208180237
Unit Type and Rh: 5100

## 2020-11-16 LAB — CBC
HCT: 23.5 % — ABNORMAL LOW (ref 36.0–46.0)
Hemoglobin: 7.8 g/dL — ABNORMAL LOW (ref 12.0–15.0)
MCH: 27.4 pg (ref 26.0–34.0)
MCHC: 33.2 g/dL (ref 30.0–36.0)
MCV: 82.5 fL (ref 80.0–100.0)
Platelets: 169 10*3/uL (ref 150–400)
RBC: 2.85 MIL/uL — ABNORMAL LOW (ref 3.87–5.11)
RDW: 12.6 % (ref 11.5–15.5)
WBC: 9.8 10*3/uL (ref 4.0–10.5)
nRBC: 0 % (ref 0.0–0.2)

## 2020-11-16 LAB — HIV ANTIBODY (ROUTINE TESTING W REFLEX): HIV Screen 4th Generation wRfx: NONREACTIVE

## 2020-11-16 MED ORDER — DOUBLE ANTIBIOTIC 500-10000 UNIT/GM EX OINT: 1.0000 "application " | TOPICAL_OINTMENT | Freq: Two times a day (BID) | CUTANEOUS | 0 refills | Status: AC

## 2020-11-16 NOTE — Discharge Summary (Signed)
Name: Rose [name] MRN: [id number] DOB: January 21, 1956 65 y.o. PCP: [name], MD  Date of Admission: 11/15/2020  2:27 AM Date of Discharge: 8/19/20228/19/22 Attending Physician: No att. providers found  Discharge Diagnosis: 1. Laceration of the left lower extremity 2. Acute on chronic Iron deficiency anemia 3. Coronary Artery Disease 4. Peripheral Vascular Disease 5. Type II Diabetes Mellitus   Discharge Medications: Allergies as of 11/16/2020       Reactions   Hydrocodone-acetaminophen         Medication List     STOP taking these medications    cyclobenzaprine 5 MG tablet Commonly known as: FLEXERIL   DULoxetine 20 MG capsule Commonly known as: CYMBALTA   naproxen sodium 220 MG tablet Commonly known as: ALEVE       TAKE these medications    albuterol 108 (90 Base) MCG/ACT inhaler Commonly known as: VENTOLIN HFA Inhale 2 puffs into the lungs every 6 (six) hours as needed for wheezing.   aspirin EC 81 MG tablet Take 81 mg by mouth daily. Swallow whole.   atorvastatin 40 MG tablet Commonly known as: LIPITOR Take 40 mg by mouth daily.   carvedilol 6.25 MG tablet Commonly known as: COREG Take 6.25 mg by mouth 2 (two) times daily.   Centrum Women Tabs Take 1 tablet by mouth daily.   cetirizine 10 MG tablet Commonly known as: ZYRTEC Take 10 mg by mouth daily.   citalopram 20 MG tablet Commonly known as: CELEXA Take 20 mg by mouth at bedtime.   clopidogrel 75 MG tablet Commonly known as: PLAVIX Take 75 mg by mouth daily.   diclofenac Sodium 1 % Gel Commonly known as: VOLTAREN Apply 2 g topically 4 (four) times daily as needed (joint pain).   fluticasone 50 MCG/ACT nasal spray Commonly known as: FLONASE Place 1 spray into both nostrils daily as needed for allergies.   hydrochlorothiazide 25 MG tablet Commonly known as: HYDRODIURIL Take 25 mg by mouth daily.   metFORMIN 1000 MG tablet Commonly known as: GLUCOPHAGE Take 1,000 mg by  mouth 2 (two) times daily.   methadone 10 MG/ML solution Commonly known as: DOLOPHINE Take 121 mg by mouth daily.   polymixin-bacitracin 500-10000 UNIT/GM Oint ointment Apply 1 application topically 2 (two) times daily for 14 days.               Discharge Care Instructions  (From admission, onward)           Start     Ordered   11/16/20 0000  Discharge wound care:       Comments: Change dressing daily.   11/16/20 1137            Disposition and follow-up:   Rose [name] was discharged from [facility] in Stable condition.  At the hospital follow up visit please address:  Acute on chronic iron deficiency anemia in the setting of lower extremity laceration. -re-evaluate in 3-6 months with an iron panel -follow up with GI for ongoing monitoring and routine endoscopic evaluation -re-evaluate the left lower extremity wound at time of follow up Chronic management -Check A1c, and make adjustments as needed. Patient taking Plavix and stated she has to take it until October 2022. Please discontinue if appropriate.   2.  Labs / imaging needed at time of follow-up: CBC, BMP  3.  Pending labs/ test needing follow-up: none  Follow-up Appointments:  Follow-up Information     [name], MD Follow up.  Specialty: Internal Medicine Why: 3-5 days Contact information: [address] [place] [address] [phone]         Daryel November, MD. Go on 12/26/2020.   Specialty: Gastroenterology Why: 2 PM appt with MD Contact information: [place] [address] [phone]                 Hospital Course by date: 08/18- Patient presented to ED via EMS. In the emergency department she was noted to be hypotensive and anemic. One unit of blood was given. Initially her wound was noted to just be oozing blood this was stapled and bleeding stopped.  However she did report some change in stool  consistency and there was a concern that she may additionally have some GI blood loss.  We did consult GI and she had an FOBT which was negative.  There is no evidence of ongoing GI bleed at this time and her wound is no longer bleeding.  Plan to continue monitoring CBC and consult GI. GI saw patient and no inpatient intervention planned.  08/19-No acute events overnight. Hgb stable. Patient deemed stable for discharge. Instructions explained and and patient endorsed understanding. Patient was discharged.    Discharge Subjective: Patient doing well without any acute complaints. She asked when she could go home. No CP, or SOB.   Discharge Exam:   BP (!) 149/60 (BP Location: Right Arm)   Pulse 79   Temp 97.6 F (36.4 C) (Oral)   Resp 18   Ht '5\' 3"'$  (1.6 m)   Wt 79.4 kg   SpO2 100%   BMI 31.00 kg/m  Discharge exam:  Physical Exam Constitutional:      Appearance: Normal appearance.  HENT:     Head: Normocephalic and atraumatic.     Right Ear: External ear normal.     Left Ear: External ear normal.     Mouth/Throat:     Mouth: Mucous membranes are moist.     Pharynx: Oropharynx is clear. No posterior oropharyngeal erythema.  Eyes:     Extraocular Movements: Extraocular movements intact.     Pupils: Pupils are equal, round, and reactive to light.  Cardiovascular:     Rate and Rhythm: Normal rate and regular rhythm.     Pulses: Normal pulses.     Heart sounds: Normal heart sounds.  Pulmonary:     Effort: Pulmonary effort is normal.     Breath sounds: Normal breath sounds.  Abdominal:     General: Bowel sounds are normal.     Palpations: Abdomen is soft.  Musculoskeletal:        General: Signs of injury (left lower leg.) present. Normal range of motion.     Cervical back: Normal range of motion and neck supple.     Comments: Laceration has 12 staples. Appears clean and dry.   Neurological:     General: No focal deficit present.     Mental Status: She is alert and oriented to  person, place, and time.  Psychiatric:        Mood and Affect: Mood normal.        Behavior: Behavior normal.     Pertinent Labs, Studies, and Procedures:  CMP Latest Ref Rng & Units 11/16/2020 11/15/2020 11/15/2020  Glucose 70 - 99 mg/dL 142(H) 87 213(H)  BUN 8 - 23 mg/dL '10 11 12  '$ Creatinine 0.44 - 1.00 mg/dL 1.05(H) 1.12(H) 1.40(H)  Sodium 135 - 145 mmol/L 132(L) 133(L) 127(L)  Potassium 3.5 - 5.1 mmol/L 3.8 3.6 3.9  Chloride 98 - 111 mmol/L 100 102 92(L)  CO2 22 - 32 mmol/L 24 24 -  Calcium 8.9 - 10.3 mg/dL 8.4(L) 8.2(L) -  Total Protein 6.5 - 8.1 g/dL - 5.5(L) -  Total Bilirubin 0.3 - 1.2 mg/dL - 0.6 -  Alkaline Phos 38 - 126 U/L - 44 -  AST 15 - 41 U/L - 21 -  ALT 0 - 44 U/L - 12 -    CBC Latest Ref Rng & Units 11/16/2020 11/15/2020 11/15/2020  WBC 4.0 - 10.5 K/uL 9.8 9.6 -  Hemoglobin 12.0 - 15.0 g/dL 7.8(L) 8.0(L) 8.4(L)  Hematocrit 36.0 - 46.0 % 23.5(L) 24.4(L) 25.8(L)  Platelets 150 - 400 K/uL 169 165 -   DG Pelvis Portable  Result Date: 11/15/2020 CLINICAL DATA:  Status post left leg laceration. EXAM: PORTABLE PELVIS 1-2 VIEWS COMPARISON:  November 05, 2017 FINDINGS: There is no evidence of pelvic fracture or diastasis. No pelvic bone lesions are seen. A spinal stimulator and associated stimulator wire are seen overlying the right iliac crest. IMPRESSION: Stable exam without an acute abnormality seen within the pelvis. Electronically Signed   By: Virgina Norfolk M.D.   On: 11/15/2020 02:57   US Abdomen Limited  Result Date: 11/15/2020 CLINICAL DATA:  Hepatomegaly EXAM: ULTRASOUND ABDOMEN LIMITED RIGHT UPPER QUADRANT COMPARISON:  11/10/2018 FINDINGS: Gallbladder: Layering gallstones, similar to the prior exam. No wall thickening visualized. No sonographic Murphy sign noted by sonographer. Common bile duct: Diameter: 6 mm, unchanged. Liver: No focal lesion identified. Mildly increased parenchymal echogenicity. Portal vein is patent on color Doppler imaging with normal direction of  blood flow towards the liver. Other: None. IMPRESSION: 1. Layering cholelithiasis, without evidence of cholecystitis, which appears similar to the prior exam. Alternatively, this could represent calcium within the gallbladder wall, which could be further evaluated with CT if clinically indicated. 2. Mildly increased parenchymal echogenicity in the liver, as can be seen with hepatic steatosis. Electronically Signed   By: Merilyn Baba M.D.   On: 11/15/2020 17:49   DG Chest Port 1 View  Result Date: 11/15/2020 CLINICAL DATA:  Left leg laceration. EXAM: PORTABLE CHEST 1 VIEW COMPARISON:  September 05, 2020 FINDINGS: Very mild atelectasis is suspected along the bilateral infrahilar regions. There is no evidence of an acute infiltrate, pleural effusion or pneumothorax. The heart size and mediastinal contours are within normal limits. Degenerative changes are noted throughout the thoracic spine. IMPRESSION: No acute cardiopulmonary disease. Electronically Signed   By: Virgina Norfolk M.D.   On: 11/15/2020 02:59   DG Tibia/Fibula Left Port  Addendum Date: 11/16/2020   ADDENDUM REPORT: 11/16/2020 09:02 ADDENDUM: Impression should state: "Superficial laceration involving the medial aspect of the distal left leg WITHOUT an acute osseous abnormality." Electronically Signed   By: Titus Dubin M.D.   On: 11/16/2020 09:02   Result Date: 11/16/2020 CLINICAL DATA:  Left leg laceration. EXAM: PORTABLE LEFT TIBIA AND FIBULA - 2 VIEW COMPARISON:  None. FINDINGS: There is no evidence of an acute fracture or dislocation. No lytic or blastic lesions are identified. There is no evidence of cortical destruction or acute periosteal reaction. Mild diffuse soft tissue swelling is noted with an ill-defined, superficial soft tissue defect seen adjacent to the medial aspect of the distal left tibial shaft. No radiopaque soft tissue foreign bodies are seen. IMPRESSION: 1. Superficial laceration involving the medial aspect of the distal  left leg with an acute osseous abnormality. Electronically Signed: By:  Virgina Norfolk M.D. On: 11/15/2020 03:04     Discharge Instructions: Discharge Instructions     Call MD for:  difficulty breathing, headache or visual disturbances   Complete by: As directed    Call MD for:  extreme fatigue   Complete by: As directed    Call MD for:  hives   Complete by: As directed    Call MD for:  persistant dizziness or light-headedness   Complete by: As directed    Call MD for:  persistant nausea and vomiting   Complete by: As directed    Call MD for:  redness, tenderness, or signs of infection (pain, swelling, redness, odor or green/yellow discharge around incision site)   Complete by: As directed    Call MD for:  severe uncontrolled pain   Complete by: As directed    Call MD for:  temperature >100.4   Complete by: As directed    Diet - low sodium heart healthy   Complete by: As directed    Discharge instructions   Complete by: As directed    You came to the ED via EMS after hitting injuring your leg causing bleeding. Your bleeding was stopped and your cut was repaired in the ED. Your red blood cells were low and you were given some blood in the ED. GI doctor was asked to see if you had any bleeding from your GI tract and they taught the chance of it was low. They want to follow outpatient and see if any more testing is required. Please follow up with your PCP and GI doctor. Please change the dressing on the wound every day and use the ointment that you will be discharged with.   Discharge wound care:   Complete by: As directed    Change dressing daily.   Increase activity slowly   Complete by: As directed        Signed: Idamae Schuller, MD 11/16/2020, 10:55 PM   Pager: [phone]

## 2020-11-17 LAB — HCV RNA QUANT: HCV Quantitative: NOT DETECTED IU/mL (ref >50)

## 2020-11-20 ENCOUNTER — Ambulatory Visit (INDEPENDENT_AMBULATORY_CARE_PROVIDER_SITE_OTHER): Payer: Medicare Other | Admitting: Internal Medicine

## 2020-11-20 ENCOUNTER — Encounter: Payer: Self-pay | Admitting: Internal Medicine

## 2020-11-20 VITALS — BP 133/67 | HR 94 | Temp 98.4°F | Resp 32 | Ht 63.0 in | Wt 156.2 lb

## 2020-11-20 DIAGNOSIS — D500 Iron deficiency anemia secondary to blood loss (chronic): Secondary | ICD-10-CM | POA: Diagnosis not present

## 2020-11-20 DIAGNOSIS — K760 Fatty (change of) liver, not elsewhere classified: Secondary | ICD-10-CM | POA: Diagnosis not present

## 2020-11-20 DIAGNOSIS — E1169 Type 2 diabetes mellitus with other specified complication: Secondary | ICD-10-CM | POA: Diagnosis not present

## 2020-11-20 DIAGNOSIS — I25118 Atherosclerotic heart disease of native coronary artery with other forms of angina pectoris: Secondary | ICD-10-CM | POA: Diagnosis not present

## 2020-11-20 DIAGNOSIS — I10 Essential (primary) hypertension: Secondary | ICD-10-CM | POA: Diagnosis not present

## 2020-11-20 DIAGNOSIS — M5441 Lumbago with sciatica, right side: Secondary | ICD-10-CM | POA: Diagnosis not present

## 2020-11-20 DIAGNOSIS — D649 Anemia, unspecified: Secondary | ICD-10-CM

## 2020-11-20 DIAGNOSIS — S81812D Laceration without foreign body, left lower leg, subsequent encounter: Secondary | ICD-10-CM | POA: Diagnosis not present

## 2020-11-20 DIAGNOSIS — E1142 Type 2 diabetes mellitus with diabetic polyneuropathy: Secondary | ICD-10-CM | POA: Diagnosis not present

## 2020-11-20 DIAGNOSIS — I739 Peripheral vascular disease, unspecified: Secondary | ICD-10-CM | POA: Diagnosis not present

## 2020-11-20 DIAGNOSIS — G8929 Other chronic pain: Secondary | ICD-10-CM

## 2020-11-20 NOTE — Patient Instructions (Signed)
Thank you, [name] for allowing [place] to provide your care today. Today we discussed hospital follow up, anemia, lower extremity wound.    Labs Ordered:  Lab Orders         CBC no Diff      Tests Ordered: Orders Placed This Encounter  Procedures   CBC no Diff      Referrals Ordered:  Referral Orders  No referral(s) requested today     Medication Changes:  Medications Discontinued During This Encounter  Medication Reason   aspirin EC 81 MG tablet    clopidogrel (PLAVIX) 75 MG tablet    carvedilol (COREG) 6.25 MG tablet    atorvastatin (LIPITOR) 40 MG tablet    citalopram (CELEXA) 20 MG tablet    albuterol (VENTOLIN HFA) 108 (90 Base) MCG/ACT inhaler    diclofenac Sodium (VOLTAREN) 1 % GEL    methadone (DOLOPHINE) 10 MG/ML solution    benzonatate (TESSALON) 100 MG capsule    cetirizine (ZYRTEC ALLERGY) 10 MG tablet    Multiple Vitamins-Minerals (ONE-A-DAY WOMENS 50+ ADVANTAGE) TABS    tizanidine (ZANAFLEX) 2 MG capsule    hydrochlorothiazide (HYDRODIURIL) 25 MG tablet    citalopram (CELEXA) 20 MG tablet      No orders of the defined types were placed in this encounter.    Health Maintenance Screening: Diabetes Health Maintenance Due  Topic Date Due   OPHTHALMOLOGY EXAM  05/15/2019   HEMOGLOBIN A1C  01/18/2021   URINE MICROALBUMIN  07/19/2021   FOOT EXAM  07/27/2021     Instructions:  - Please keep wound clean and dry. Change dressing daily   Follow up:  Thursday or Monday for stable removal.    Remember: If you have any questions or concerns, call our clinic at [phone] or after hours call [phone] and ask for the internal medicine resident on call.  [name], D.O. [place]

## 2020-11-20 NOTE — Progress Notes (Signed)
CC: symptomatic anemia follow up  HPI:  [name] is a 65 y.o. female with a past medical history stated below and presents today for symptomatic anemia. Please see problem based assessment and plan for additional details.  Past Medical History:  Diagnosis Date   ACE inhibitor-aggravated angioedema, initial encounter 04/26/2016   Allergic rhinitis    Anxiety    Arthritis    Calculus of gallbladder without cholecystitis without obstruction 10/28/2018   Chronic back pain    CKD (chronic kidney disease), stage III ([place])    folllowed by pcp   Coronary artery disease    Depression    Diabetic neuropathy ([place])    Edema of lower extremity 10/28/2018   Encounter for completion of form with patient 06/21/2019   Environmental allergies    Environmental allergies 12/28/2015   Fibromyalgia    Frequency of urination    History of chronic bronchitis    History of drug abuse ([place]) states quit herion in 2006   History of hepatitis C last abd ultrasound in epic 11-10-2018;  last hepative panel in epic 11-30-2018 was normal   previously followed by [facility] (notes in care everywhere)-- dx 04/ 2015,  started harvonic treatment 07/ 2015   History of rib fracture 10/28/2018   HPV in female 06/11/2016   Hypertension    followed by pcp  (nuclear stress test 01-15-2017 in epic,  showed low risk normal w/ nuclear ef 55%)   Laceration of right foot 03/04/2019   Lipoma of neck 07/16/2017   Seen by general surgery Dr Marlou Starks May 16 - plan for removal of 2cm sebaceous cyst.    Methadone maintenance therapy patient ([place])    Mild asthma    followed by pcp   Nocturia    PAD (peripheral artery disease) ([place])    followed by cardiology, dr Fletcher Anon---  06-01-2019  s/p bilateral CIA angioplasty stenting   Recurrent falls 04/30/2016   Right foot ulcer ([place])    Symptomatic anemia 11/15/2020   Tendonitis    bilateral wrist   Type 2 diabetes mellitus ([place])    followed by pcp    (06-16-2019  per pt checks blood sugar every other day in am,  fasting sugar 92-95)   Urge urinary incontinence     Current Outpatient Medications on File Prior to Visit  Medication Sig Dispense Refill   albuterol (VENTOLIN HFA) 108 (90 Base) MCG/ACT inhaler Inhale 2 puffs into the lungs every 6 (six) hours as needed. For wheezing 1 each 2   atorvastatin (LIPITOR) 40 MG tablet Take 40 mg by mouth daily.     atorvastatin (LIPITOR) 80 MG tablet Take 1 tablet (80 mg total) by mouth daily. 30 tablet 11   carvedilol (COREG) 6.25 MG tablet Take 1 tablet (6.25 mg total) by mouth 2 (two) times daily. 60 tablet 11   cetirizine (ZYRTEC) 10 MG tablet Take 10 mg by mouth daily.     clopidogrel (PLAVIX) 75 MG tablet Take 1 tablet (75 mg total) by mouth daily with breakfast. 90 tablet 3   cyclobenzaprine (FLEXERIL) 5 MG tablet TAKE 1 TABLET(5 MG) BY MOUTH THREE TIMES DAILY AS NEEDED FOR MUSCLE SPASMS 30 tablet 2   diclofenac Sodium (VOLTAREN) 1 % GEL Apply 2 g topically 4 (four) times daily as needed (joint pain).     DULoxetine (CYMBALTA) 20 MG capsule Take 3 capsules (60 mg total) by mouth daily. 90 capsule 2   fluticasone (FLONASE) 50 MCG/ACT nasal spray  SHAKE LIQUID AND USE 1 SPRAY IN EACH NOSTRIL DAILY 16 g 5   fluticasone (FLONASE) 50 MCG/ACT nasal spray Place 1 spray into both nostrils daily as needed for allergies.     hydrochlorothiazide (HYDRODIURIL) 25 MG tablet Take 25 mg by mouth daily.     Lidocaine 1.8 % PTCH Apply 1 patch topically daily. 30 patch 1   metFORMIN (GLUCOPHAGE) 1000 MG tablet TAKE 1 TABLET(1000 MG) BY MOUTH TWICE DAILY WITH A MEAL 60 tablet 5   metFORMIN (GLUCOPHAGE) 1000 MG tablet Take 1,000 mg by mouth 2 (two) times daily.     methadone (DOLOPHINE) 10 MG/5ML solution Take 126 mg by mouth daily.      Multiple Vitamins-Minerals (CENTRUM WOMEN) TABS Take 1 tablet by mouth daily.     nicotine (NICODERM CQ - DOSED IN MG/24 HOURS) 14 mg/24hr patch Medicaid [id number] L Wks 1-6: 14 mg  x 1 patch daily. Wear for 24 hours. If you have sleep disturbances, remove at bedtime. (Patient taking differently: Place 14 mg onto the skin See admin instructions. Medicaid [id number] L Wks 1-6: 14 mg x 1 patch daily. Wear for 24 hours. If you have sleep disturbances, remove at bedtime.) 30 patch 2   polymixin-bacitracin (POLYSPORIN) 500-10000 UNIT/GM OINT ointment Apply 1 application topically 2 (two) times daily for 14 days. 28 g 0   SANTYL ointment Apply 1 application topically daily. On foot     silver sulfADIAZINE (SILVADENE) 1 % cream Apply pea-sized amount to wound daily. (Patient taking differently: Apply 1 application topically See admin instructions. Apply pea-sized amount to wound daily.) 50 g 0   No current facility-administered medications on file prior to visit.    Family History  Problem Relation Age of Onset   Hypertension Mother    Diabetes type II Mother    Heart attack Mother 55   CAD Father    Heart attack Father 48   Hypertension Brother    Diabetes type II Brother    Heart attack Brother 75   Diabetes type II Sister     Social History   Socioeconomic History   Marital status: Single    Spouse name: Not on file   Number of children: Not on file   Years of education: Not on file   Highest education level: Not on file  Occupational History   Not on file  Tobacco Use   Smoking status: Light Smoker    Packs/day: 0.50    Years: 20.00    Pack years: 10.00    Types: Cigarettes    Last attempt to quit: 06/07/2020    Years since quitting: 0.4   Smokeless tobacco: Never  Vaping Use   Vaping Use: Never used  Substance and Sexual Activity   Alcohol use: No    Alcohol/week: 0.0 standard drinks   Drug use: Not Currently    Comment: hx heroin use, IV,  last used 2006 (currently on methadone)   Sexual activity: Not Currently  Other Topics Concern   Not on file  Social History Narrative   Not on file   Social Determinants of Health   Financial Resource  Strain: Not on file  Food Insecurity: Not on file  Transportation Needs: Not on file  Physical Activity: Not on file  Stress: Not on file  Social Connections: Not on file  Intimate Partner Violence: Not on file    Review of Systems: ROS negative except for what is noted on the assessment and plan.  Vitals:   11/20/20 1524  11/20/20 1532  BP: (!) 157/116 133/67  Pulse: (!) 108 94  Resp: (!) 32   Temp: 98.4 F (36.9 C)   TempSrc: Oral   SpO2: 100%   Weight: 156 lb 3.2 oz (70.9 kg)   Height: '5\' 3"'$  (1.6 m)     Physical Exam: Gen: A&O x3 and in no apparent distress, well appearing and nourished. HEENT: Head - normocephalic, atraumatic. Eye -  visual acuity grossly intact, conjunctiva clear, sclera non-icteric, EOM intact. Mouth - No obvious caries or periodontal disease. Neck: no obvious masses or nodules, AROM intact. CV: RRR, no murmurs, rubs, or gallops. S1/S2 presents  Resp: Clear to ascultation bilaterally  Abd: BS (+) x4, soft, non-tender, without obvious hepatosplenomegaly or masses MSK: Grossly normal AROM and strength x4 extremities. Skin: good skin turgor, no rashes, unusual bruising, left lower extremity laceration with staples.  No surrounding erythema edema, or rash.  There is some serous drainage on the bandage. Neuro: No focal deficits, grossly normal sensation and coordination.  Psych: Oriented x3 and responding appropriately. Intact recent and remote memory, normal mood, judgement, affect , and insight.     Assessment & Plan:   See Encounters Tab for problem based charting.  Patient discussed with Dr. Karie Schwalbe, D.O. [facility], PGY-3 Pager: [phone], Phone: [phone] Date 11/21/2020 Time 2:02 PM

## 2020-11-21 ENCOUNTER — Encounter: Payer: Self-pay | Admitting: Internal Medicine

## 2020-11-21 DIAGNOSIS — S81819A Laceration without foreign body, unspecified lower leg, initial encounter: Secondary | ICD-10-CM | POA: Insufficient documentation

## 2020-11-21 LAB — CBC
Hematocrit: 24.2 % — ABNORMAL LOW (ref 34.0–46.6)
Hemoglobin: 8.1 g/dL — ABNORMAL LOW (ref 11.1–15.9)
MCH: 27.7 pg (ref 26.6–33.0)
MCHC: 33.5 g/dL (ref 31.5–35.7)
MCV: 83 fL (ref 79–97)
Platelets: 274 10*3/uL (ref 150–450)
RBC: 2.92 x10E6/uL — ABNORMAL LOW (ref 3.77–5.28)
RDW: 13 % (ref 11.7–15.4)
WBC: 10.6 10*3/uL (ref 3.4–10.8)

## 2020-11-21 MED ORDER — DULOXETINE HCL 20 MG PO CPEP: 60.0000 mg | ORAL_CAPSULE | Freq: Every day | ORAL | 2 refills | Status: DC

## 2020-11-21 MED ORDER — CYCLOBENZAPRINE HCL 5 MG PO TABS: 5.0000 mg | ORAL_TABLET | Freq: Every day | ORAL | 0 refills | Status: DC | PRN

## 2020-11-21 NOTE — Assessment & Plan Note (Signed)
Presents for further evaluation and management of her left lower extremity laceration.  Patient primary tension wound closure with staples on 08/18.  Evaluation of the patient's wound she appears to be having good wound healing without significant erythema, edema, or rash.  There is however some serous drainage notable on the bandage.  Plan: -Follow-up appointment on Monday for staple removal

## 2020-11-21 NOTE — Assessment & Plan Note (Signed)
Patient on duloxetine 60 mg daily for peripheral neuropathy and depression.  Will refill today

## 2020-11-21 NOTE — Assessment & Plan Note (Signed)
HPI: Patient presents for hospital follow-up for symptomatic anemia secondary to a left lower leg laceration on dual antiplatelet therapy.  Patient is status post 1 unit PRBCs.  Iron studies did not show significantly low ferritin or percent saturation.  On evaluation today, she denies any signs or symptoms of anemia.  There are no obvious signs of bleeding on exam.   Assessment/Plan: -We will repeat CBC to make sure hemoglobin is stable   **ADDENDUM**  Hemoglobin is stable.  CBC Latest Ref Rng & Units 11/20/2020 11/16/2020 11/15/2020  WBC 3.4 - 10.8 x10E3/uL 10.6 9.8 9.6  Hemoglobin 11.1 - 15.9 g/dL 8.1(L) 7.8(L) 8.0(L)  Hematocrit 34.0 - 46.6 % 24.2(L) 23.5(L) 24.4(L)  Platelets 150 - 450 x10E3/uL 274 169 165

## 2020-11-21 NOTE — Assessment & Plan Note (Addendum)
Patient has chronic back pain currently on methadone 126 mg daily, duloxetine 60 mg daily, and flexeril 5 mg. She has had imagining that shows some mild DDD n her lumbar spine without impingement.   Assessment/Plan: Patient has somatic pain form her lower back with likely a functional component. She has had some increased falls over the last few months and would benefit from avoiding centrally acting medications when possible.  - Will refill duloxetine and flexeril.  - Will start to taper off flexeril as she is starting to have increased falls.

## 2020-11-21 NOTE — Assessment & Plan Note (Signed)
HPI: Presents for further evaluation and management of her hypertension.  Her blood pressure today is 133/67 on Coreg 6.25 mg twice daily and hydralazine 25 mg daily.  She denies any adverse reactions to these medications.  Assessment/plan: -Goal BP of <130/90 -Continue antihypertensive medications.

## 2020-11-21 NOTE — Assessment & Plan Note (Addendum)
HPI: Patient presents for further evaluation management of her diabetes.  Her last A1c was 6.3 (06/2020).  She is currently taking metformin 1000 mg twice daily.  Patient states that she is overdue for an eye exam for this year.  She does previously have a known diagnosis of diabetic retinopathy. she states that she goes to see Dr. Herbert Deaner at [facility].   Assessment/Plan: -Continue metformin 1000 mg twice daily -We will get A1c at her follow-up appointment on Monday -Need to perform foot exam at her follow-up appointment on Monday -Reminded patient to get an eye exam this year

## 2020-11-21 NOTE — Assessment & Plan Note (Signed)
HPI: Patient presents for further evaluation management of her peripheral artery disease.  Patient has a significant history of PAD status post bilateral common iliac stents placed on 05/2019.  He has been medically managed with high intensity statin and dual antiplatelet therapy with aspirin and Plavix.  Patient's most recent ABIs showed patent stents with mild residual PAD.  Recently, patient developed a left lower extremity laceration with resulting symptomatic anemia requiring short hospitalization and blood transfusion.   Assessment/Plan: Considering patient's significant history of ASCVD patient would likely benefit from antiplatelet therapy.  Considering her most recent hospitalization for symptomatic anemia, I will discontinue her ASA 81 mg and continue her Plavix 75 mg.  I will confer with the patient's Vascular physician regarding this decision -Discontinue aspirin 81 mg -Continue Plavix 75 mg and Atorvastatin 80 mg.  -Will have repeat ABIs

## 2020-11-22 NOTE — Progress Notes (Signed)
Internal Medicine Clinic Attending  Case discussed with Dr. Coe  At the time of the visit.  We reviewed the resident's history and exam and pertinent patient test results.  I agree with the assessment, diagnosis, and plan of care documented in the resident's note.  

## 2020-11-26 ENCOUNTER — Ambulatory Visit (INDEPENDENT_AMBULATORY_CARE_PROVIDER_SITE_OTHER): Payer: Medicare Other | Admitting: Internal Medicine

## 2020-11-26 VITALS — BP 132/60 | HR 92 | Temp 98.2°F | Wt 154.1 lb

## 2020-11-26 DIAGNOSIS — S81812D Laceration without foreign body, left lower leg, subsequent encounter: Secondary | ICD-10-CM | POA: Diagnosis not present

## 2020-11-26 NOTE — Patient Instructions (Signed)
Thank you, Ms.Baily [name] for allowing [place] to provide your care today. Today we discussed leg wound.    Labs Ordered: Lab Orders  No laboratory test(s) ordered today     Tests Ordered: No orders of the defined types were placed in this encounter.     Referrals Ordered:  Referral Orders  No referral(s) requested today     Medication Changes:  There are no discontinued medications.   No orders of the defined types were placed in this encounter.    Health Maintenance Screening: Diabetes Health Maintenance Due  Topic Date Due   OPHTHALMOLOGY EXAM  05/15/2019   HEMOGLOBIN A1C  01/18/2021   URINE MICROALBUMIN  07/19/2021   FOOT EXAM  07/27/2021     Instructions:  - Please clean your wound daily with soap and water, please be gentle   - Please keep the wound covered. - You can use antibiotic cream/ointment on the wound   Follow up:  Telehealth visit in 1 week and inperson visit in 2 weeks.     Remember: If you have any questions or concerns, call our clinic at [phone] or after hours call [phone] and ask for the internal medicine resident on call.  [name], D.O. [place]

## 2020-11-27 ENCOUNTER — Encounter: Payer: Self-pay | Admitting: Internal Medicine

## 2020-11-27 NOTE — Assessment & Plan Note (Signed)
HPI: Patient presents for further evaluation and management of left lower leg laceration. Wound appears to be healing well. She does have a small area of macerated skin with some mixed hypopigmentation and hyperpigmentation concerning for devitalized tissue. This are is small and does not appear overtly infected.   Assessment/Plan: - Stables removed - Continue daily wound care - Keep wound covered - Follow up in 1 week via telehealth to ensure there are no signs of infection.

## 2020-11-27 NOTE — Progress Notes (Signed)
Internal Medicine Clinic Attending  I saw and evaluated the patient.  I personally confirmed the key portions of the history and exam documented by Dr. Coe and I reviewed pertinent patient test results.  The assessment, diagnosis, and plan were formulated together and I agree with the documentation in the resident's note.    

## 2020-11-27 NOTE — Progress Notes (Signed)
Subjective:  CC: leg laceration   HPI:  Rose [name] is a 65 y.o. female with a past medical history stated below and presents today for leg laceration. Please see problem based assessment and plan for additional details.  Past Medical History:  Diagnosis Date   ACE inhibitor-aggravated angioedema, initial encounter 04/26/2016   Allergic rhinitis    Anxiety    Arthritis    Calculus of gallbladder without cholecystitis without obstruction 10/28/2018   Chronic back pain    CKD (chronic kidney disease), stage III ([place])    folllowed by pcp   Coronary artery disease    Depression    Diabetic neuropathy (Rose [name])    Edema of lower extremity 10/28/2018   Encounter for completion of form with patient 06/21/2019   Environmental allergies    Environmental allergies 12/28/2015   Fibromyalgia    Frequency of urination    History of chronic bronchitis    History of drug abuse ([place]) states quit herion in 2006   History of hepatitis C last abd ultrasound in epic 11-10-2018;  last hepative panel in epic 11-30-2018 was normal   previously followed by [facility] (notes in care everywhere)-- dx 04/ 2015,  started harvonic treatment 07/ 2015   History of rib fracture 10/28/2018   HPV in female 06/11/2016   Hypertension    followed by pcp  (nuclear stress test 01-15-2017 in epic,  showed low risk normal w/ nuclear ef 55%)   Laceration of right foot 03/04/2019   Lipoma of neck 07/16/2017   Seen by general surgery Dr Marlou Starks May 16 - plan for removal of 2cm sebaceous cyst.    Methadone maintenance therapy patient ([place])    Mild asthma    followed by pcp   Nocturia    PAD (peripheral artery disease) ([place])    followed by cardiology, dr Fletcher Anon---  06-01-2019  s/p bilateral CIA angioplasty stenting   Recurrent falls 04/30/2016   Right foot ulcer ([place])    Symptomatic anemia 11/15/2020   Tendonitis    bilateral wrist   Type 2 diabetes mellitus (Rose [name])    followed by pcp    (06-16-2019  per pt checks blood sugar every other day in am,  fasting sugar 92-95)   Urge urinary incontinence     Current Outpatient Medications on File Prior to Visit  Medication Sig Dispense Refill   albuterol (VENTOLIN HFA) 108 (90 Base) MCG/ACT inhaler Inhale 2 puffs into the lungs every 6 (six) hours as needed. For wheezing 1 each 2   atorvastatin (LIPITOR) 40 MG tablet Take 40 mg by mouth daily.     atorvastatin (LIPITOR) 80 MG tablet Take 1 tablet (80 mg total) by mouth daily. 30 tablet 11   carvedilol (COREG) 6.25 MG tablet Take 1 tablet (6.25 mg total) by mouth 2 (two) times daily. 60 tablet 11   cetirizine (ZYRTEC) 10 MG tablet Take 10 mg by mouth daily.     clopidogrel (PLAVIX) 75 MG tablet Take 1 tablet (75 mg total) by mouth daily with breakfast. 90 tablet 3   cyclobenzaprine (FLEXERIL) 5 MG tablet Take 1 tablet (5 mg total) by mouth daily as needed for up to 30 doses for muscle spasms. 30 tablet 0   diclofenac Sodium (VOLTAREN) 1 % GEL Apply 2 g topically 4 (four) times daily as needed (joint pain).     DULoxetine (CYMBALTA) 20 MG capsule Take 3 capsules (60 mg total) by mouth daily. 90 capsule 2  fluticasone (FLONASE) 50 MCG/ACT nasal spray SHAKE LIQUID AND USE 1 SPRAY IN EACH NOSTRIL DAILY 16 g 5   fluticasone (FLONASE) 50 MCG/ACT nasal spray Place 1 spray into both nostrils daily as needed for allergies.     hydrochlorothiazide (HYDRODIURIL) 25 MG tablet Take 25 mg by mouth daily.     Lidocaine 1.8 % PTCH Apply 1 patch topically daily. 30 patch 1   metFORMIN (GLUCOPHAGE) 1000 MG tablet TAKE 1 TABLET(1000 MG) BY MOUTH TWICE DAILY WITH A MEAL 60 tablet 5   metFORMIN (GLUCOPHAGE) 1000 MG tablet Take 1,000 mg by mouth 2 (two) times daily.     methadone (DOLOPHINE) 10 MG/5ML solution Take 126 mg by mouth daily.      Multiple Vitamins-Minerals (CENTRUM WOMEN) TABS Take 1 tablet by mouth daily.     nicotine (NICODERM CQ - DOSED IN MG/24 HOURS) 14 mg/24hr patch Medicaid [id number] L  Wks 1-6: 14 mg x 1 patch daily. Wear for 24 hours. If you have sleep disturbances, remove at bedtime. (Patient taking differently: Place 14 mg onto the skin See admin instructions. Medicaid [id number] L Wks 1-6: 14 mg x 1 patch daily. Wear for 24 hours. If you have sleep disturbances, remove at bedtime.) 30 patch 2   polymixin-bacitracin (POLYSPORIN) 500-10000 UNIT/GM OINT ointment Apply 1 application topically 2 (two) times daily for 14 days. 28 g 0   SANTYL ointment Apply 1 application topically daily. On foot     silver sulfADIAZINE (SILVADENE) 1 % cream Apply pea-sized amount to wound daily. (Patient taking differently: Apply 1 application topically See admin instructions. Apply pea-sized amount to wound daily.) 50 g 0   No current facility-administered medications on file prior to visit.    Family History  Problem Relation Age of Onset   Hypertension Mother    Diabetes type II Mother    Heart attack Mother 67   CAD Father    Heart attack Father 35   Hypertension Brother    Diabetes type II Brother    Heart attack Brother 14   Diabetes type II Sister     Social History   Socioeconomic History   Marital status: Single    Spouse name: Not on file   Number of children: Not on file   Years of education: Not on file   Highest education level: Not on file  Occupational History   Not on file  Tobacco Use   Smoking status: Light Smoker    Packs/day: 0.50    Years: 20.00    Pack years: 10.00    Types: Cigarettes    Last attempt to quit: 06/07/2020    Years since quitting: 0.4   Smokeless tobacco: Never  Vaping Use   Vaping Use: Never used  Substance and Sexual Activity   Alcohol use: No    Alcohol/week: 0.0 standard drinks   Drug use: Not Currently    Comment: hx heroin use, IV,  last used 2006 (currently on methadone)   Sexual activity: Not Currently  Other Topics Concern   Not on file  Social History Narrative   Not on file   Social Determinants of Health   Financial  Resource Strain: Not on file  Food Insecurity: Not on file  Transportation Needs: Not on file  Physical Activity: Not on file  Stress: Not on file  Social Connections: Not on file  Intimate Partner Violence: Not on file    Review of Systems: ROS negative except for what is noted on the assessment and plan.  Objective:   Vitals:   11/26/20 1418  BP: 132/60  Pulse: 92  Temp: 98.2 F (36.8 C)  TempSrc: Oral  SpO2: 97%  Weight: 154 lb 1.6 oz (69.9 kg)    Physical Exam: Gen: A&O x3 and in no apparent distress, well appearing and nourished. Resp: Clear to ascultation bilaterally  Abd: BS (+) x4, soft, non-tender abdomen, without hepatosplenomegaly or masses MSK: Grossly normal AROM and strength x4 extremities. Skin: good skin turgor, no rashes, unusual bruising, or prominent lesions. Well healing laceration with small area of hyper and hypopigmentation. Some serosanguinous drainage without purulence, erythema or warmth.   Media Information  Assessment & Plan:  See Encounters Tab for problem based charting.  Patient discussed with Dr. Hilbert Odor, D.O. [facility]  PGY-3 Pager: [phone]  Phone: [phone]

## 2020-12-04 ENCOUNTER — Ambulatory Visit (INDEPENDENT_AMBULATORY_CARE_PROVIDER_SITE_OTHER): Payer: Medicare Other | Admitting: Internal Medicine

## 2020-12-04 ENCOUNTER — Other Ambulatory Visit: Payer: Self-pay | Admitting: Internal Medicine

## 2020-12-04 DIAGNOSIS — S81812D Laceration without foreign body, left lower leg, subsequent encounter: Secondary | ICD-10-CM

## 2020-12-04 MED ORDER — CETIRIZINE HCL 10 MG PO TABS: 10.0000 mg | ORAL_TABLET | Freq: Every day | ORAL | 11 refills | Status: DC

## 2020-12-04 MED ORDER — ALBUTEROL SULFATE HFA 108 (90 BASE) MCG/ACT IN AERS: 2.0000 | INHALATION_SPRAY | Freq: Four times a day (QID) | RESPIRATORY_TRACT | 2 refills | Status: DC | PRN

## 2020-12-04 NOTE — Telephone Encounter (Signed)
Refill Request    albuterol (VENTOLIN HFA) 108 (90 Base) MCG/ACT inhaler  cetirizine (ZYRTEC) 10 MG tablet   [facility] [id number] - [place], [place] - [address] (Ph: [phone])

## 2020-12-04 NOTE — Progress Notes (Signed)
   I connected with  Rose [name] on 12/05/20 by telephone and verified that I am speaking with the correct person using two identifiers.   I discussed the limitations of evaluation and management by telemedicine. The patient expressed understanding and agreed to proceed.  CC: Lt. Leg Wound  This is a telephone encounter between Rose [name] and Rose [name] on 12/05/2020 for leg wound. The visit was conducted with the patient located at home and Rose [name] at Rose [name]. The patient's identity was confirmed using their DOB and current address. The patient has consented to being evaluated through a telephone encounter and understands the associated risks (an examination cannot be done and the patient may need to come in for an appointment) / benefits (allows the patient to remain at home, decreasing exposure to coronavirus). I personally spent 7 minutes on medical discussion.   HPI:  Rose [name] is a 65 y.o. with PMH as below.   Please see A&P for assessment of the patient's acute and chronic medical conditions.   Past Medical History:  Diagnosis Date   ACE inhibitor-aggravated angioedema, initial encounter 04/26/2016   Allergic rhinitis    Anxiety    Arthritis    Calculus of gallbladder without cholecystitis without obstruction 10/28/2018   Chronic back pain    CKD (chronic kidney disease), stage III ([name])    folllowed by pcp   Coronary artery disease    Depression    Diabetic neuropathy ([name])    Edema of lower extremity 10/28/2018   Encounter for completion of form with patient 06/21/2019   Environmental allergies    Environmental allergies 12/28/2015   Fibromyalgia    Frequency of urination    History of chronic bronchitis    History of drug abuse ([name]) states quit herion in 2006   History of hepatitis C last abd ultrasound in epic 11-10-2018;  last hepative panel in epic 11-30-2018 was normal   previously followed by [name]  (notes in care everywhere)-- dx 04/ 2015,  started harvonic treatment 07/ 2015   History of rib fracture 10/28/2018   HPV in female 06/11/2016   Hypertension    followed by pcp  (nuclear stress test 01-15-2017 in epic,  showed low risk normal w/ nuclear ef 55%)   Laceration of right foot 03/04/2019   Lipoma of neck 07/16/2017   Seen by general surgery Dr Marlou Starks May 16 - plan for removal of 2cm sebaceous cyst.    Methadone maintenance therapy patient ([name])    Mild asthma    followed by pcp   Nocturia    PAD (peripheral artery disease) ([name])    followed by cardiology, dr Fletcher Anon---  06-01-2019  s/p bilateral CIA angioplasty stenting   Recurrent falls 04/30/2016   Right foot ulcer ([name])    Symptomatic anemia 11/15/2020   Tendonitis    bilateral wrist   Type 2 diabetes mellitus ([name])    followed by pcp   (06-16-2019  per pt checks blood sugar every other day in am,  fasting sugar 92-95)   Urge urinary incontinence    Review of Systems:  see assessment and plan  Assessment & Plan:   See Encounters Tab for problem based charting.  Patient discussed with Dr.  Jimmye Norman

## 2020-12-05 ENCOUNTER — Encounter: Payer: Self-pay | Admitting: Internal Medicine

## 2020-12-05 NOTE — Assessment & Plan Note (Signed)
HPI: Patient presents for further evaluation and management of left leg wound.  Patient states that wound is healing well.  She states that there is some erythema at the superior aspect but otherwise denies any purulent drainage, rash, edema, or discoloration around the wound.  She endorsed cleaning the wound daily and keeping it covered.   Assessment/Plan: -Counseled patient to continue conservative management as instructed at previous appointment. - Considering the patient has diabetes and is at risk for diabetic wound/infection, we will follow up in one week to reevaluate the leg wound.

## 2020-12-10 ENCOUNTER — Encounter: Payer: Self-pay | Admitting: Internal Medicine

## 2020-12-10 ENCOUNTER — Ambulatory Visit (INDEPENDENT_AMBULATORY_CARE_PROVIDER_SITE_OTHER): Payer: Medicare Other | Admitting: Internal Medicine

## 2020-12-10 ENCOUNTER — Other Ambulatory Visit: Payer: Self-pay

## 2020-12-10 DIAGNOSIS — S81812D Laceration without foreign body, left lower leg, subsequent encounter: Secondary | ICD-10-CM

## 2020-12-10 DIAGNOSIS — I10 Essential (primary) hypertension: Secondary | ICD-10-CM | POA: Diagnosis not present

## 2020-12-10 NOTE — Assessment & Plan Note (Signed)
Assessment: Patient has history of hypertension that is well controlled. She is on HCTZ 25 daily and Coreg 6.25 mg.  Plan:  -Continue current meds

## 2020-12-10 NOTE — Assessment & Plan Note (Signed)
Assessment: Patient presented with follow up of laceration of left leg. The laceration appeared to improve from previously as endorsed by the patient. She states it still has some drainage but very minimal. The area did not seem infected on examination and appeared as pictured in the physical exam.   Plan: -Continue daily dressing changes and application of Neosporin.  -Follow up in 2 weeks to see if complete to see significant resolution.

## 2020-12-10 NOTE — Patient Instructions (Addendum)
Ms.Makaiyah [name], it was a pleasure seeing you today!  Today we discussed: You presented for follow up for leg laceration. Your wound appears to be healing well. Continue using Neosporin and changing daily. We will follow up in 2 weeks to see if it has fully resolved.   I have ordered the following labs today:  Lab Orders  No laboratory test(s) ordered today      Referrals ordered today:   Referral Orders  No referral(s) requested today     I have ordered the following medication/changed the following medications:   Stop the following medications: There are no discontinued medications.   Start the following medications: No orders of the defined types were placed in this encounter.    Follow-up: 2 weeks  Please make sure to arrive 15 minutes prior to your next appointment. If you arrive late, you may be asked to reschedule.   We look forward to seeing you next time. Please call our clinic at [phone] if you have any questions or concerns. The best time to call is Monday-Friday from 9am-4pm, but there is someone available 24/7. If after hours or the weekend, call the main hospital number and ask for the Internal Medicine Resident On-Call. If you need medication refills, please notify your pharmacy one week in advance and they will send [place] a request.  Thank you for letting us take part in your care. Wishing you the best!  Thank you, Idamae Schuller, MD

## 2020-12-10 NOTE — Progress Notes (Signed)
Internal Medicine Clinic Attending  Case discussed with Dr. Coe  At the time of the visit.  We reviewed the resident's history and exam and pertinent patient test results.  I agree with the assessment, diagnosis, and plan of care documented in the resident's note.  

## 2020-12-10 NOTE — Progress Notes (Signed)
CC: Follow up   HPI:  [name] is a 65 y.o. with medical history as below presenting to [facility] for follow up on leg laceration.  Please see problem-based list for further details, assessments, and plans.  Past Medical History:  Diagnosis Date  . ACE inhibitor-aggravated angioedema, initial encounter 04/26/2016  . Allergic rhinitis   . Anxiety   . Arthritis   . Calculus of gallbladder without cholecystitis without obstruction 10/28/2018  . Chronic back pain   . CKD (chronic kidney disease), stage III ([place])    folllowed by pcp  . Coronary artery disease   . Depression   . Diabetic neuropathy ([place])   . Edema of lower extremity 10/28/2018  . Encounter for completion of form with patient 06/21/2019  . Environmental allergies   . Environmental allergies 12/28/2015  . Fibromyalgia   . Frequency of urination   . History of chronic bronchitis   . History of drug abuse ([place]) states quit herion in 2006  . History of hepatitis C last abd ultrasound in epic 11-10-2018;  last hepative panel in epic 11-30-2018 was normal   previously followed by [facility] (notes in care everywhere)-- dx 04/ 2015,  started harvonic treatment 07/ 2015  . History of rib fracture 10/28/2018  . HPV in female 06/11/2016  . Hypertension    followed by pcp  (nuclear stress test 01-15-2017 in epic,  showed low risk normal w/ nuclear ef 55%)  . Laceration of right foot 03/04/2019  . Lipoma of neck 07/16/2017   Seen by general surgery Dr Marlou Starks May 16 - plan for removal of 2cm sebaceous cyst.   . Methadone maintenance therapy patient ([place])   . Mild asthma    followed by pcp  . Nocturia   . PAD (peripheral artery disease) ([place])    followed by cardiology, dr Fletcher Anon---  06-01-2019  s/p bilateral CIA angioplasty stenting  . Recurrent falls 04/30/2016  . Right foot ulcer ([place])   . Symptomatic anemia 11/15/2020  . Tendonitis    bilateral wrist  . Type 2 diabetes mellitus ([place])    followed  by pcp   (06-16-2019  per pt checks blood sugar every other day in am,  fasting sugar 92-95)  . Urge urinary incontinence    Review of Systems: Review of system negative unless stated in the problem list or HPI.      Physical Exam:  Vitals:   12/10/20 1059  BP: 133/66  Pulse: 77  Temp: 98.2 F (36.8 C)  TempSrc: Oral  SpO2: 100%  Weight: 155 lb 11.2 oz (70.6 kg)  Height: '5\' 3"'$  (1.6 m)    Physical Exam Constitutional:      Appearance: Normal appearance.  HENT:     Head: Normocephalic and atraumatic.     Right Ear: External ear normal.     Left Ear: External ear normal.     Nose: Nose normal.  Eyes:     Extraocular Movements: Extraocular movements intact.     Conjunctiva/sclera: Conjunctivae normal.  Cardiovascular:     Rate and Rhythm: Normal rate and regular rhythm.  Pulmonary:     Effort: Pulmonary effort is normal.     Breath sounds: Normal breath sounds.  Abdominal:     General: Bowel sounds are normal.     Palpations: Abdomen is soft.  Musculoskeletal:        General: Normal range of motion.     Right lower leg: No edema.  Left lower leg: No edema.  Skin:    Capillary Refill: Capillary refill takes less than 2 seconds.     Findings: Lesion (left shin laceration as shown in the picture below) present.  Neurological:     General: No focal deficit present.     Mental Status: She is alert and oriented to person, place, and time.  Psychiatric:        Mood and Affect: Mood normal.        Behavior: Behavior normal.     Assessment & Plan:   See Encounters Tab for problem based charting.  Patient seen with Dr. Letha Cape, MD

## 2020-12-13 ENCOUNTER — Other Ambulatory Visit: Payer: Self-pay | Admitting: Internal Medicine

## 2020-12-18 NOTE — Telephone Encounter (Signed)
Refill request from pt's pharmacy for atorvastatin 40mg  once daily Pt has both atorvastatin 40mg  and 80mg  tabs on chart Attempetd to contact pt to confirm dose taking No answer, HIPPA compliant message left on recorder.

## 2020-12-24 ENCOUNTER — Encounter: Payer: Self-pay | Admitting: Internal Medicine

## 2020-12-24 ENCOUNTER — Other Ambulatory Visit: Payer: Self-pay

## 2020-12-24 ENCOUNTER — Ambulatory Visit (INDEPENDENT_AMBULATORY_CARE_PROVIDER_SITE_OTHER): Payer: Medicare Other | Admitting: Internal Medicine

## 2020-12-24 VITALS — BP 120/58 | HR 75 | Temp 98.9°F | Resp 24 | Ht 63.0 in | Wt 153.5 lb

## 2020-12-24 DIAGNOSIS — Z0000 Encounter for general adult medical examination without abnormal findings: Secondary | ICD-10-CM | POA: Diagnosis not present

## 2020-12-24 DIAGNOSIS — I10 Essential (primary) hypertension: Secondary | ICD-10-CM | POA: Diagnosis not present

## 2020-12-24 DIAGNOSIS — S81812D Laceration without foreign body, left lower leg, subsequent encounter: Secondary | ICD-10-CM

## 2020-12-24 DIAGNOSIS — E1169 Type 2 diabetes mellitus with other specified complication: Secondary | ICD-10-CM | POA: Diagnosis not present

## 2020-12-24 LAB — POCT GLYCOSYLATED HEMOGLOBIN (HGB A1C): Hemoglobin A1C: 5.6 % (ref 4.0–5.6)

## 2020-12-24 LAB — GLUCOSE, CAPILLARY: Glucose-Capillary: 155 mg/dL — ABNORMAL HIGH (ref 70–99)

## 2020-12-24 NOTE — Patient Instructions (Addendum)
[name], it was a pleasure seeing you today!  Today we discussed: You stated you were doing great. The leg appears improved and has healed significantly. Continue all current meds. You received a flu shot today. We will place a referral for eye exam. We will schedule a pap smear in 2 months along with a general follow up. You can continue using neosporin and new bandage for the wound.   I have ordered the following labs today:  Lab Orders         POC Hbg A1C        Referrals ordered today:   Referral Orders         Ambulatory referral to Ophthalmology       I have ordered the following medication/changed the following medications:   Stop the following medications: There are no discontinued medications.   Start the following medications: No orders of the defined types were placed in this encounter.    Follow-up: 2 month follow up with pap smear  Please make sure to arrive 15 minutes prior to your next appointment. If you arrive late, you may be asked to reschedule.   We look forward to seeing you next time. Please call our clinic at [phone] if you have any questions or concerns. The best time to call is Monday-Friday from 9am-4pm, but there is someone available 24/7. If after hours or the weekend, call the main hospital number and ask for the Internal Medicine Resident On-Call. If you need medication refills, please notify your pharmacy one week in advance and they will send [place] a request.  Thank you for letting us take part in your care. Wishing you the best!  Thank you, Rose Schuller, MD

## 2020-12-24 NOTE — Progress Notes (Signed)
CC: follow up  HPI:  Ms.Geneive Vonetta Foulk is a 65 y.o. with medical history as below presenting to [facility] for follow up for leg laceration.   Please see problem-based list for further details, assessments, and plans.  Past Medical History:  Diagnosis Date   ACE inhibitor-aggravated angioedema, initial encounter 04/26/2016   Allergic rhinitis    Anxiety    Arthritis    Calculus of gallbladder without cholecystitis without obstruction 10/28/2018   Chronic back pain    CKD (chronic kidney disease), stage III ([place])    folllowed by pcp   Coronary artery disease    Depression    Diabetic neuropathy ([place])    Edema of lower extremity 10/28/2018   Encounter for completion of form with patient 06/21/2019   Environmental allergies    Environmental allergies 12/28/2015   Fibromyalgia    Foot ulcer secondary to diabetes 06/21/2019   Frequency of urination    History of chronic bronchitis    History of drug abuse ([place]) states quit herion in 2006   History of hepatitis C last abd ultrasound in epic 11-10-2018;  last hepative panel in epic 11-30-2018 was normal   previously followed by [facility] (notes in care everywhere)-- dx 04/ 2015,  started harvonic treatment 07/ 2015   History of rib fracture 10/28/2018   HPV in female 06/11/2016   Hypertension    followed by pcp  (nuclear stress test 01-15-2017 in epic,  showed low risk normal w/ nuclear ef 55%)   Laceration of right foot 03/04/2019   Lipoma of neck 07/16/2017   Seen by general surgery Dr Marlou Starks May 16 - plan for removal of 2cm sebaceous cyst.    Methadone maintenance therapy patient ([place])    Mild asthma    followed by pcp   Nocturia    PAD (peripheral artery disease) ([place])    followed by cardiology, dr Fletcher Anon---  06-01-2019  s/p bilateral CIA angioplasty stenting   Recurrent falls 04/30/2016   Right foot ulcer ([place])    Symptomatic anemia 11/15/2020   Tendonitis    bilateral wrist   Type 2 diabetes mellitus ([place])     followed by pcp   (06-16-2019  per pt checks blood sugar every other day in am,  fasting sugar 92-95)   Urge urinary incontinence    Review of Systems: Review of system negative unless stated in the problem list or HPI.      Physical Exam:  Vitals:   12/24/20 1401  BP: (!) 141/65  Pulse: 78  Resp: (!) 24  Temp: 98.9 F (37.2 C)  TempSrc: Oral  SpO2: 99%  Weight: 153 lb 8 oz (69.6 kg)  Height: 5\' 3"  (1.6 m)    Physical Exam Constitutional:      General: She is not in acute distress.    Appearance: Normal appearance. She is not ill-appearing.  HENT:     Head: Normocephalic and atraumatic.     Right Ear: External ear normal.     Left Ear: External ear normal.     Nose: Nose normal.     Mouth/Throat:     Mouth: Mucous membranes are moist.     Pharynx: Oropharynx is clear.  Eyes:     Extraocular Movements: Extraocular movements intact.     Conjunctiva/sclera: Conjunctivae normal.     Pupils: Pupils are equal, round, and reactive to light.  Cardiovascular:     Rate and Rhythm: Normal rate and regular rhythm.  Pulses: Normal pulses.     Heart sounds: Normal heart sounds. No murmur heard.   No friction rub. No gallop.  Pulmonary:     Effort: Pulmonary effort is normal. No respiratory distress.     Breath sounds: Normal breath sounds. No stridor. No wheezing or rhonchi.  Abdominal:     General: Bowel sounds are normal. There is no distension.     Palpations: Abdomen is soft. There is no mass.  Musculoskeletal:        General: No swelling or tenderness. Normal range of motion.     Cervical back: Normal range of motion and neck supple.  Skin:    Capillary Refill: Capillary refill takes less than 2 seconds.     Coloration: Skin is not jaundiced.     Findings: Lesion (pictured below) present. No erythema or rash.  Neurological:     General: No focal deficit present.     Mental Status: She is alert and oriented to person, place, and time. Mental status is at  baseline.  Psychiatric:        Mood and Affect: Mood normal.        Behavior: Behavior normal.     Assessment & Plan:   See Encounters Tab for problem based charting.  Patient seen with Dr. Dollene Cleveland, MD

## 2020-12-25 NOTE — Progress Notes (Signed)
Internal Medicine Clinic Attending  I saw and evaluated the patient.  I personally confirmed the key portions of the history and exam documented by Dr. Khan and I reviewed pertinent patient test results.  The assessment, diagnosis, and plan were formulated together and I agree with the documentation in the resident's note.  

## 2020-12-25 NOTE — Assessment & Plan Note (Signed)
Assessment: Patient hypertensive initially but endorsed better control at home. Repeat blood pressure was normotensive.   Plan: Continue current meds

## 2020-12-25 NOTE — Assessment & Plan Note (Addendum)
Flu shot received this visit. Offered pap smear this visit but patient would like a pap smear at next visit. Eye referral made. Patient states she will receive shingles vaccine from the pharmacy next week as well.

## 2020-12-25 NOTE — Assessment & Plan Note (Signed)
Assessment: Patient's leg laceration has mostly resolved. She states it has minimal drainage. She is still applying Neosporin daily with bandage changes. Advised patient the risk of infection is low and that we can continue current neosporin and dressing changes for few more days. She does not need to follow up regarding this again.  Plan: -Continue Neosporin and bandage changes daily

## 2020-12-26 ENCOUNTER — Other Ambulatory Visit (INDEPENDENT_AMBULATORY_CARE_PROVIDER_SITE_OTHER): Payer: Medicare Other

## 2020-12-26 ENCOUNTER — Ambulatory Visit (INDEPENDENT_AMBULATORY_CARE_PROVIDER_SITE_OTHER): Payer: Medicare Other | Admitting: Gastroenterology

## 2020-12-26 ENCOUNTER — Encounter: Payer: Self-pay | Admitting: Gastroenterology

## 2020-12-26 VITALS — BP 124/72 | HR 74 | Ht 63.0 in | Wt 155.0 lb

## 2020-12-26 DIAGNOSIS — D649 Anemia, unspecified: Secondary | ICD-10-CM

## 2020-12-26 DIAGNOSIS — K760 Fatty (change of) liver, not elsewhere classified: Secondary | ICD-10-CM

## 2020-12-26 LAB — CBC WITH DIFFERENTIAL/PLATELET
Basophils Absolute: 0.1 10*3/uL (ref 0.0–0.1)
Basophils Relative: 0.6 % (ref 0.0–3.0)
Eosinophils Absolute: 0.2 10*3/uL (ref 0.0–0.7)
Eosinophils Relative: 2.2 % (ref 0.0–5.0)
HCT: 32.4 % — ABNORMAL LOW (ref 36.0–46.0)
Hemoglobin: 10.6 g/dL — ABNORMAL LOW (ref 12.0–15.0)
Lymphocytes Relative: 29.1 % (ref 12.0–46.0)
Lymphs Abs: 2.9 10*3/uL (ref 0.7–4.0)
MCHC: 32.9 g/dL (ref 30.0–36.0)
MCV: 79.8 fl (ref 78.0–100.0)
Monocytes Absolute: 0.8 10*3/uL (ref 0.1–1.0)
Monocytes Relative: 8.2 % (ref 3.0–12.0)
Neutro Abs: 6.1 10*3/uL (ref 1.4–7.7)
Neutrophils Relative %: 59.9 % (ref 43.0–77.0)
Platelets: 234 10*3/uL (ref 150.0–400.0)
RBC: 4.06 Mil/uL (ref 3.87–5.11)
RDW: 13.8 % (ref 11.5–15.5)
WBC: 10.1 10*3/uL (ref 4.0–10.5)

## 2020-12-26 LAB — HEPATIC FUNCTION PANEL
ALT: 10 U/L (ref 0–35)
AST: 17 U/L (ref 0–37)
Albumin: 4.2 g/dL (ref 3.5–5.2)
Alkaline Phosphatase: 74 U/L (ref 39–117)
Bilirubin, Direct: 0.1 mg/dL (ref 0.0–0.3)
Total Bilirubin: 0.4 mg/dL (ref 0.2–1.2)
Total Protein: 7.8 g/dL (ref 6.0–8.3)

## 2020-12-26 LAB — IBC PANEL
Iron: 56 ug/dL (ref 42–145)
Saturation Ratios: 15.3 % — ABNORMAL LOW (ref 20.0–50.0)
TIBC: 365.4 ug/dL (ref 250.0–450.0)
Transferrin: 261 mg/dL (ref 212.0–360.0)

## 2020-12-26 LAB — VITAMIN B12: Vitamin B-12: 1331 pg/mL — ABNORMAL HIGH (ref 211–911)

## 2020-12-26 LAB — FOLATE: Folate: >23.4 ng/mL (ref >5.9)

## 2020-12-26 NOTE — Patient Instructions (Signed)
If you are age 63 or older, your body mass index should be between 23-30. Your Body mass index is 27.46 kg/m. If this is out of the aforementioned range listed, please consider follow up with your Primary Care Provider.  If you are age 64 or younger, your body mass index should be between 19-25. Your Body mass index is 27.46 kg/m. If this is out of the aformentioned range listed, please consider follow up with your Primary Care Provider.   Your provider has requested that you go to the basement level for lab work before leaving today. Press "B" on the elevator. The lab is located at the first door on the left as you exit the elevator.  You have been scheduled for an abdominal ultrasound with elastography at [facility] (1st floor). Your appointment is scheduled for 01/04/21 at 9:30am. Please arrive 15 minutes prior to your scheduled appointment for registration purposes. Make certain not to have anything to eat or drink 6 hours prior to your procedure. Should you need to reschedule your appointment, you may contact radiology at [phone].  Liver Elastography Various chronic liver diseases such as hepatitis B, C, and fatty liver disease can lead to tissue damage and subsequent scar tissue formation. As the scar tissue accumulates, the liver loses some of its elasticity and becomes stiffer. Liver elastography involves the use of a surface ultrasound probe that delivers a low frequency pulse or shear wave to a small volume of liver tissue under the rib cage. The transmission of the sound wave is completely painless. How Is a Liver Elastography Performed? The liver is located in the right upper abdomen under the rib cage. Patients are asked to lie flat on an examination table. A technician places the FibroScan probe between the ribs on the right side of the lower chest wall. A series of 10 painless pulses are then applied to the liver. The results are recorded on the equipment and an overall  liver stiffness score is generated. This score is then interpreted by a qualified physician to predict the likelihood of advanced fibrosis or cirrhosis.  Patients are asked to wear loose clothing and should not consume any liquids or solids for a minimum of 4 hours before the test to increase the likelihood of obtaining reliable test results. The scan will take 10 to 15 minutes to complete, but patients should plan on being available for 30 minutes to allow time for preparation    The [facility] providers would like to encourage you to use [facility] to communicate with providers for non-urgent requests or questions.  Due to long hold times on the telephone, sending your provider a message by [facility] may be a faster and more efficient way to get a response.  Please allow 48 business hours for a response.  Please remember that this is for non-urgent requests.   Due to recent changes in healthcare laws, you may see the results of your imaging and laboratory studies on MyChart before your provider has had a chance to review them.  We understand that in some cases there may be results that are confusing or concerning to you. Not all laboratory results come back in the same time frame and the provider may be waiting for multiple results in order to interpret others.  Please give [place] 48 hours in order for your provider to thoroughly review all the results before contacting the office for clarification of your results.   It was a pleasure to see you today!  Thank you for trusting me with your gastrointestinal care!    Scott E.Candis Schatz, MD

## 2020-12-26 NOTE — Progress Notes (Signed)
Internal Medicine Clinic Attending  I saw and evaluated the patient.  I personally confirmed the key portions of the history and exam documented by Dr. Khan and I reviewed pertinent patient test results.  The assessment, diagnosis, and plan were formulated together and I agree with the documentation in the resident's note.  

## 2020-12-26 NOTE — Progress Notes (Signed)
HPI :   Got a flu shot yesterday, having some   Chronic constipation, takes dulcolax 15 mg as needed, usually once every 2 week Has a BM every 3-4 days She tried taking Miralax once  No upper GI symptoms.   History of HCV Heavy drinker in the past; no alcohol in the past 10+ years   Past Medical History:  Diagnosis Date   ACE inhibitor-aggravated angioedema, initial encounter 04/26/2016   Allergic rhinitis    Anxiety    Arthritis    Calculus of gallbladder without cholecystitis without obstruction 10/28/2018   Chronic back pain    CKD (chronic kidney disease), stage III ([place])    folllowed by pcp   Coronary artery disease    Depression    Diabetic neuropathy ([place])    Edema of lower extremity 10/28/2018   Encounter for completion of form with patient 06/21/2019   Environmental allergies    Environmental allergies 12/28/2015   Fibromyalgia    Foot ulcer secondary to diabetes 06/21/2019   Frequency of urination    History of chronic bronchitis    History of drug abuse ([place]) states quit herion in 2006   History of hepatitis C last abd ultrasound in epic 11-10-2018;  last hepative panel in epic 11-30-2018 was normal   previously followed by [facility] (notes in care everywhere)-- dx 04/ 2015,  started harvonic treatment 07/ 2015   History of rib fracture 10/28/2018   HPV in female 06/11/2016   Hypertension    followed by pcp  (nuclear stress test 01-15-2017 in epic,  showed low risk normal w/ nuclear ef 55%)   Laceration of right foot 03/04/2019   Lipoma of neck 07/16/2017   Seen by general surgery Dr Marlou Starks May 16 - plan for removal of 2cm sebaceous cyst.    Methadone maintenance therapy patient ([place])    Mild asthma    followed by pcp   Nocturia    PAD (peripheral artery disease) ([place])    followed by cardiology, dr Fletcher Anon---  06-01-2019  s/p bilateral CIA angioplasty stenting   Recurrent falls 04/30/2016   Right foot ulcer ([place])    Symptomatic anemia  11/15/2020   Tendonitis    bilateral wrist   Type 2 diabetes mellitus ([place])    followed by pcp   (06-16-2019  per pt checks blood sugar every other day in am,  fasting sugar 92-95)   Urge urinary incontinence      Past Surgical History:  Procedure Laterality Date   ABDOMINAL AORTOGRAM W/LOWER EXTREMITY Bilateral 06/01/2019   Procedure: ABDOMINAL AORTOGRAM W/LOWER EXTREMITY;  Surgeon: Wellington Hampshire, MD;  Location: [place] [facility];  Service: Cardiovascular;  Laterality: Bilateral;   APPENDECTOMY  1972   INTERSTIM IMPLANT PLACEMENT  10/23/2011   Procedure: Barrie Lyme IMPLANT FIRST STAGE;  Surgeon: Reece Packer, MD;  Location[facility];  Service: Urology;  Laterality: N/A;   INTERSTIM IMPLANT PLACEMENT  10/23/2011   Procedure: Barrie Lyme IMPLANT SECOND STAGE;  Surgeon: Reece Packer, MD;  Location[facility];  Service: Urology;  Laterality: N/A;   LEFT HEART CATH AND CORONARY ANGIOGRAPHY N/A 01/11/2020   Procedure: LEFT HEART CATH AND CORONARY ANGIOGRAPHY;  Surgeon: Wellington Hampshire, MD;  Location: [place] [facility];  Service: Cardiovascular;  Laterality: N/A;   PANACOS TISSUE GRAFT Right 06/24/2019   Procedure: Application of Skin Graft Substitute;  Surgeon: Evelina Bucy, DPM;  Location: [place];  Service:  Podiatry;  Laterality: Right;   PERIPHERAL VASCULAR INTERVENTION Bilateral 06/01/2019   Procedure: PERIPHERAL VASCULAR INTERVENTION;  Surgeon: Wellington Hampshire, MD;  Location: [place] [facility];  Service: Cardiovascular;  Laterality: Bilateral;   WOUND DEBRIDEMENT Right 06/24/2019   Procedure: DEBRIDEMENT WOUND;  Surgeon: Evelina Bucy, DPM;  Location: [place];  Service: Podiatry;  Laterality: Right;   WOUND DEBRIDEMENT Right 08/24/2019   Procedure: DEBRIDEMENT RIGHT FOOT WOUND; APPLICATION OF SKIN GRAFT SUBSTITUTE;  Surgeon: Evelina Bucy, DPM;  Location: [place];  Service:  Podiatry;  Laterality: Right;   Family History  Problem Relation Age of Onset   Hypertension Mother    Diabetes type II Mother    Heart attack Mother 51   CAD Father    Heart attack Father 59   Hypertension Brother    Diabetes type II Brother    Heart attack Brother 35   Diabetes type II Sister    Social History   Tobacco Use   Smoking status: Light Smoker    Packs/day: 0.50    Years: 20.00    Pack years: 10.00    Types: Cigarettes    Last attempt to quit: 06/07/2020    Years since quitting: 0.5   Smokeless tobacco: Never  Vaping Use   Vaping Use: Never used  Substance Use Topics   Alcohol use: No    Alcohol/week: 0.0 standard drinks   Drug use: Not Currently    Comment: hx heroin use, IV,  last used 2006 (currently on methadone)   Current Outpatient Medications  Medication Sig Dispense Refill   albuterol (VENTOLIN HFA) 108 (90 Base) MCG/ACT inhaler Inhale 2 puffs into the lungs every 6 (six) hours as needed. For wheezing 1 each 2   atorvastatin (LIPITOR) 80 MG tablet Take 1 tablet (80 mg total) by mouth daily. 30 tablet 11   carvedilol (COREG) 6.25 MG tablet Take 1 tablet (6.25 mg total) by mouth 2 (two) times daily. 60 tablet 11   cetirizine (ZYRTEC) 10 MG tablet Take 1 tablet (10 mg total) by mouth daily. 30 tablet 11   clopidogrel (PLAVIX) 75 MG tablet Take 1 tablet (75 mg total) by mouth daily with breakfast. 90 tablet 3   cyclobenzaprine (FLEXERIL) 5 MG tablet Take 1 tablet (5 mg total) by mouth daily as needed for up to 30 doses for muscle spasms. 30 tablet 0   diclofenac Sodium (VOLTAREN) 1 % GEL Apply 2 g topically 4 (four) times daily as needed (joint pain).     DULoxetine (CYMBALTA) 20 MG capsule Take 3 capsules (60 mg total) by mouth daily. 90 capsule 2   fluticasone (FLONASE) 50 MCG/ACT nasal spray SHAKE LIQUID AND USE 1 SPRAY IN EACH NOSTRIL DAILY 16 g 5   hydrochlorothiazide (HYDRODIURIL) 25 MG tablet Take 25 mg by mouth daily.     metFORMIN (GLUCOPHAGE)  1000 MG tablet TAKE 1 TABLET(1000 MG) BY MOUTH TWICE DAILY WITH A MEAL 60 tablet 5   methadone (DOLOPHINE) 10 MG/5ML solution Take 126 mg by mouth daily.      Multiple Vitamins-Minerals (CENTRUM WOMEN) TABS Take 1 tablet by mouth daily.     nicotine (NICODERM CQ - DOSED IN MG/24 HOURS) 14 mg/24hr patch Medicaid [id number] L Wks 1-6: 14 mg x 1 patch daily. Wear for 24 hours. If you have sleep disturbances, remove at bedtime. (Patient taking differently: Place 14 mg onto the skin See admin instructions. Medicaid [id number] L Wks 1-6: 14 mg x 1 patch daily.  Wear for 24 hours. If you have sleep disturbances, remove at bedtime.) 30 patch 2   No current facility-administered medications for this visit.   Allergies  Allergen Reactions   Lisinopril Swelling    Angioedema   Gabapentin Hives and Other (See Comments)    Only when takes over 300 mg dose. "gives me the shakes"   Vicodin [Hydrocodone-Acetaminophen] Hives, Diarrhea and Other (See Comments)    Stomach  cramps   Hydrocodone-Acetaminophen      Review of Systems: All systems reviewed and negative except where noted in HPI.    No results found.  Physical Exam: BP 124/72   Pulse 74   Ht 5\' 3"  (1.6 m)   Wt 155 lb (70.3 kg)   SpO2 97%   BMI 27.46 kg/m  Constitutional: Pleasant,well-developed, African American female in no acute distress. HEENT: Normocephalic and atraumatic. Conjunctivae are normal. No scleral icterus. Neck supple.  Cardiovascular: Normal rate, regular rhythm.  Pulmonary/chest: Effort normal and breath sounds normal. No wheezing, rales or rhonchi. Abdominal: Soft, nondistended, nontender. Bowel sounds active throughout. There are no masses palpable. No hepatomegaly. Extremities: Stockings, bandage overlying healing wound Lymphadenopathy: No cervical adenopathy noted. Neurological: Alert and oriented to person place and time. Skin: Skin is warm and dry. No rashes noted. Psychiatric: Normal mood and affect. Behavior is  normal.  CBC    Component Value Date/Time   WBC 10.6 11/20/2020 1620   WBC 9.8 11/16/2020 0316   RBC 2.92 (L) 11/20/2020 1620   RBC 2.85 (L) 11/16/2020 0316   HGB 8.1 (L) 11/20/2020 1620   HCT 24.2 (L) 11/20/2020 1620   PLT 274 11/20/2020 1620   MCV 83 11/20/2020 1620   MCH 27.7 11/20/2020 1620   MCH 27.4 11/16/2020 0316   MCHC 33.5 11/20/2020 1620   MCHC 33.2 11/16/2020 0316   RDW 13.0 11/20/2020 1620   LYMPHSABS 2.0 09/05/2020 1103   LYMPHSABS 3.9 (H) 01/29/2017 1334   MONOABS 0.8 09/05/2020 1103   EOSABS 0.2 09/05/2020 1103   EOSABS 0.1 01/29/2017 1334   BASOSABS 0.0 09/05/2020 1103   BASOSABS 0.0 01/29/2017 1334    CMP     Component Value Date/Time   NA 132 (L) 11/16/2020 0316   NA 138 07/19/2020 1200   K 3.8 11/16/2020 0316   CL 100 11/16/2020 0316   CO2 24 11/16/2020 0316   GLUCOSE 142 (H) 11/16/2020 0316   BUN 10 11/16/2020 0316   BUN 18 07/19/2020 1200   CREATININE 1.05 (H) 11/16/2020 0316   CALCIUM 8.4 (L) 11/16/2020 0316   PROT 5.5 (L) 11/15/2020 1902   PROT 7.1 11/30/2018 0912   ALBUMIN 2.7 (L) 11/15/2020 1902   ALBUMIN 4.3 11/30/2018 0912   AST 21 11/15/2020 1902   ALT 12 11/15/2020 1902   ALKPHOS 44 11/15/2020 1902   BILITOT 0.6 11/15/2020 1902   BILITOT 0.3 11/30/2018 0912   GFRNONAA 60 (L) 11/16/2020 0316   GFRAA 46 (L) 04/19/2020 1119     ASSESSMENT AND PLAN: Anemia - CBC - Iron panel, B12/folate  Fatty liver - Elastography [name], MD

## 2020-12-27 NOTE — Progress Notes (Signed)
HPI : Rose [name] is a very pleasant 65 year old female with a history of coronary artery disease, CKD, history of hepatitis C and history of IV drug use, opioid addiction who presents to our clinic today for follow-up after recent hospitalization.  She was hospitalized for a severe leg laceration that she sustained at home.  She presented to the emergency room in hypotensive shock and hemostasis was achieved and she was transfused 1 unit of blood.  GI was consulted because she presented with significant anemia, that was not thought to be entirely attributable to blood loss from her leg wound.  In addition, she has chronic constipation and had evidence of hepatic steatosis on her ultrasound.   Today, the patient states that she is feeling pretty well.  Her leg wound has been healing well.  She continues to deny any evidence of GI blood loss to include hematochezia or melena.  She continues to have trouble with chronic constipation, usually having a bowel movement every 3 to 4 days.  She has been taking Dulcolax as needed, usually once every 2 weeks or so, which produces large bowel movements.  She is tried taking MiraLAX in the past, but admits she would only take it once or twice and has never tried taking it consistently.  She denies any upper GI symptoms. She reports being treated for hepatitis C in the past, but does not remember the name of the drug.  An HCV RNA level was obtained in the hospital, and showed no evidence of HCV viremia.  She states that she used to be a heavy drinker in the past, but denies really any alcohol at all in the past 10 years.   Past Medical History:  Diagnosis Date   ACE inhibitor-aggravated angioedema, initial encounter 04/26/2016   Allergic rhinitis    Anxiety    Arthritis    Calculus of gallbladder without cholecystitis without obstruction 10/28/2018   Chronic back pain    CKD (chronic kidney disease), stage III ([place])    folllowed by pcp   Coronary artery  disease    Depression    Diabetic neuropathy ([place])    Edema of lower extremity 10/28/2018   Encounter for completion of form with patient 06/21/2019   Environmental allergies    Environmental allergies 12/28/2015   Fibromyalgia    Foot ulcer secondary to diabetes 06/21/2019   Frequency of urination    History of chronic bronchitis    History of drug abuse ([place]) states quit herion in 2006   History of hepatitis C last abd ultrasound in epic 11-10-2018;  last hepative panel in epic 11-30-2018 was normal   previously followed by [facility] (notes in care everywhere)-- dx 04/ 2015,  started harvonic treatment 07/ 2015   History of rib fracture 10/28/2018   HPV in female 06/11/2016   Hypertension    followed by pcp  (nuclear stress test 01-15-2017 in epic,  showed low risk normal w/ nuclear ef 55%)   Laceration of right foot 03/04/2019   Lipoma of neck 07/16/2017   Seen by general surgery Dr Marlou Starks May 16 - plan for removal of 2cm sebaceous cyst.    Methadone maintenance therapy patient ([place])    Mild asthma    followed by pcp   Nocturia    PAD (peripheral artery disease) ([place])    followed by cardiology, dr Fletcher Anon---  06-01-2019  s/p bilateral CIA angioplasty stenting   Recurrent falls 04/30/2016   Right foot ulcer ([place])  Symptomatic anemia 11/15/2020   Tendonitis    bilateral wrist   Type 2 diabetes mellitus ([place])    followed by pcp   (06-16-2019  per pt checks blood sugar every other day in am,  fasting sugar 92-95)   Urge urinary incontinence      Past Surgical History:  Procedure Laterality Date   ABDOMINAL AORTOGRAM W/LOWER EXTREMITY Bilateral 06/01/2019   Procedure: ABDOMINAL AORTOGRAM W/LOWER EXTREMITY;  Surgeon: Wellington Hampshire, MD;  Location: [place] [facility];  Service: Cardiovascular;  Laterality: Bilateral;   APPENDECTOMY  1972   INTERSTIM IMPLANT PLACEMENT  10/23/2011   Procedure: Barrie Lyme IMPLANT FIRST STAGE;  Surgeon: Reece Packer, MD;  Location:  [facility];  Service: Urology;  Laterality: N/A;   INTERSTIM IMPLANT PLACEMENT  10/23/2011   Procedure: Barrie Lyme IMPLANT SECOND STAGE;  Surgeon: Reece Packer, MD;  Location: [facility];  Service: Urology;  Laterality: N/A;   LEFT HEART CATH AND CORONARY ANGIOGRAPHY N/A 01/11/2020   Procedure: LEFT HEART CATH AND CORONARY ANGIOGRAPHY;  Surgeon: Wellington Hampshire, MD;  Location: [place] [facility];  Service: Cardiovascular;  Laterality: N/A;   PANACOS TISSUE GRAFT Right 06/24/2019   Procedure: Application of Skin Graft Substitute;  Surgeon: Evelina Bucy, DPM;  Location: [place];  Service: Podiatry;  Laterality: Right;   PERIPHERAL VASCULAR INTERVENTION Bilateral 06/01/2019   Procedure: PERIPHERAL VASCULAR INTERVENTION;  Surgeon: Wellington Hampshire, MD;  Location: [place] [facility];  Service: Cardiovascular;  Laterality: Bilateral;   WOUND DEBRIDEMENT Right 06/24/2019   Procedure: DEBRIDEMENT WOUND;  Surgeon: Evelina Bucy, DPM;  Location: [place];  Service: Podiatry;  Laterality: Right;   WOUND DEBRIDEMENT Right 08/24/2019   Procedure: DEBRIDEMENT RIGHT FOOT WOUND; APPLICATION OF SKIN GRAFT SUBSTITUTE;  Surgeon: Evelina Bucy, DPM;  Location: [place];  Service: Podiatry;  Laterality: Right;   Family History  Problem Relation Age of Onset   Hypertension Mother    Diabetes type II Mother    Heart attack Mother 39   CAD Father    Heart attack Father 48   Hypertension Brother    Diabetes type II Brother    Heart attack Brother 26   Diabetes type II Sister    Social History   Tobacco Use   Smoking status: Light Smoker    Packs/day: 0.50    Years: 20.00    Pack years: 10.00    Types: Cigarettes    Last attempt to quit: 06/07/2020    Years since quitting: 0.5   Smokeless tobacco: Never  Vaping Use   Vaping Use: Never used  Substance Use Topics   Alcohol use: No    Alcohol/week: 0.0  standard drinks   Drug use: Not Currently    Comment: hx heroin use, IV,  last used 2006 (currently on methadone)   Current Outpatient Medications  Medication Sig Dispense Refill   albuterol (VENTOLIN HFA) 108 (90 Base) MCG/ACT inhaler Inhale 2 puffs into the lungs every 6 (six) hours as needed. For wheezing 1 each 2   atorvastatin (LIPITOR) 80 MG tablet Take 1 tablet (80 mg total) by mouth daily. 30 tablet 11   carvedilol (COREG) 6.25 MG tablet Take 1 tablet (6.25 mg total) by mouth 2 (two) times daily. 60 tablet 11   cetirizine (ZYRTEC) 10 MG tablet Take 1 tablet (10 mg total) by mouth daily. 30 tablet 11   clopidogrel (PLAVIX) 75 MG tablet Take 1  tablet (75 mg total) by mouth daily with breakfast. 90 tablet 3   cyclobenzaprine (FLEXERIL) 5 MG tablet Take 1 tablet (5 mg total) by mouth daily as needed for up to 30 doses for muscle spasms. 30 tablet 0   diclofenac Sodium (VOLTAREN) 1 % GEL Apply 2 g topically 4 (four) times daily as needed (joint pain).     DULoxetine (CYMBALTA) 20 MG capsule Take 3 capsules (60 mg total) by mouth daily. 90 capsule 2   fluticasone (FLONASE) 50 MCG/ACT nasal spray SHAKE LIQUID AND USE 1 SPRAY IN EACH NOSTRIL DAILY 16 g 5   hydrochlorothiazide (HYDRODIURIL) 25 MG tablet Take 25 mg by mouth daily.     metFORMIN (GLUCOPHAGE) 1000 MG tablet TAKE 1 TABLET(1000 MG) BY MOUTH TWICE DAILY WITH A MEAL 60 tablet 5   methadone (DOLOPHINE) 10 MG/5ML solution Take 126 mg by mouth daily.      Multiple Vitamins-Minerals (CENTRUM WOMEN) TABS Take 1 tablet by mouth daily.     nicotine (NICODERM CQ - DOSED IN MG/24 HOURS) 14 mg/24hr patch Medicaid [id number] L Wks 1-6: 14 mg x 1 patch daily. Wear for 24 hours. If you have sleep disturbances, remove at bedtime. (Patient taking differently: Place 14 mg onto the skin See admin instructions. Medicaid [id number] L Wks 1-6: 14 mg x 1 patch daily. Wear for 24 hours. If you have sleep disturbances, remove at bedtime.) 30 patch 2   No current  facility-administered medications for this visit.   Allergies  Allergen Reactions   Lisinopril Swelling    Angioedema   Gabapentin Hives and Other (See Comments)    Only when takes over 300 mg dose. "gives me the shakes"   Vicodin [Hydrocodone-Acetaminophen] Hives, Diarrhea and Other (See Comments)    Stomach  cramps   Hydrocodone-Acetaminophen      Review of Systems: All systems reviewed and negative except where noted in HPI.    No results found.  Physical Exam: BP 124/72   Pulse 74   Ht 5\' 3"  (1.6 m)   Wt 155 lb (70.3 kg)   SpO2 97%   BMI 27.46 kg/m  Constitutional: Pleasant,well-developed, African American female in no acute distress. HEENT: Normocephalic and atraumatic. Conjunctivae are normal. No scleral icterus. Neck supple.  Cardiovascular: Normal rate, regular rhythm.  Pulmonary/chest: Effort normal and breath sounds normal. No wheezing, rales or rhonchi. Abdominal: Soft, nondistended, nontender. Bowel sounds active throughout. There are no masses palpable. No hepatomegaly. Extremities: Stockings, bandage overlying healing wound Lymphadenopathy: No cervical adenopathy noted. Neurological: Alert and oriented to person place and time. Skin: Skin is warm and dry. No rashes noted. Psychiatric: Normal mood and affect. Behavior is normal.  CBC    Component Value Date/Time   WBC 10.6 11/20/2020 1620   WBC 9.8 11/16/2020 0316   RBC 2.92 (L) 11/20/2020 1620   RBC 2.85 (L) 11/16/2020 0316   HGB 8.1 (L) 11/20/2020 1620   HCT 24.2 (L) 11/20/2020 1620   PLT 274 11/20/2020 1620   MCV 83 11/20/2020 1620   MCH 27.7 11/20/2020 1620   MCH 27.4 11/16/2020 0316   MCHC 33.5 11/20/2020 1620   MCHC 33.2 11/16/2020 0316   RDW 13.0 11/20/2020 1620   LYMPHSABS 2.0 09/05/2020 1103   LYMPHSABS 3.9 (H) 01/29/2017 1334   MONOABS 0.8 09/05/2020 1103   EOSABS 0.2 09/05/2020 1103   EOSABS 0.1 01/29/2017 1334   BASOSABS 0.0 09/05/2020 1103   BASOSABS 0.0 01/29/2017 1334     CMP     Component Value  Date/Time   NA 132 (L) 11/16/2020 0316   NA 138 07/19/2020 1200   K 3.8 11/16/2020 0316   CL 100 11/16/2020 0316   CO2 24 11/16/2020 0316   GLUCOSE 142 (H) 11/16/2020 0316   BUN 10 11/16/2020 0316   BUN 18 07/19/2020 1200   CREATININE 1.05 (H) 11/16/2020 0316   CALCIUM 8.4 (L) 11/16/2020 0316   PROT 5.5 (L) 11/15/2020 1902   PROT 7.1 11/30/2018 0912   ALBUMIN 2.7 (L) 11/15/2020 1902   ALBUMIN 4.3 11/30/2018 0912   AST 21 11/15/2020 1902   ALT 12 11/15/2020 1902   ALKPHOS 44 11/15/2020 1902   BILITOT 0.6 11/15/2020 1902   BILITOT 0.3 11/30/2018 0912   GFRNONAA 60 (L) 11/16/2020 0316   GFRAA 46 (L) 04/19/2020 1119     ASSESSMENT AND PLAN: 65 year old female with history of coronary artery disease, CKD, hepatitis C status post eradication, diabetes presents to GI clinic for follow-up after hospitalization for leg laceration and drop in hemoglobin.  The patient's hemoglobin has been a baseline 11-12 last year, then drop down to 10 earlier this year, and dropped to 8 when she presented with an acute hemorrhage from a deep leg laceration last month.  Her most recent hemoglobin was 8.1 on August 23 with an MCV of 83.  She does not have any evidence of overt GI bleeding.  The patient does not think she is ever had a colonoscopy, and I cannot find evidence of 1.  She did have a negative fecal occult blood test in the hospital, which is considered valid colon cancer screening test.  She is not eager to undergo a colonoscopy right now if not absolutely necessary.  I would like to repeat her blood counts and get a iron panel.  If her anemia is not improving and her iron panel is consistent with iron deficiency, I think we need to do both an upper and lower endoscopy.  If her hemoglobin is improving, we may consider further observation without endoscopic intervention.  Patient was in favor of this plan. With regards to her fatty liver, I recommended we get elastography  to assess the degree of fibrosis, given her history of heavy alcohol use and hepatitis C.  Anemia - CBC - Iron panel, B12/folate -Consider endoscopic assessment based on these results  Colon cancer screening - Negative FOBT in hospital, will be due for screening again in 1 year.  Fatty liver - Elastography  Jalecia Leon E. Candis Schatz, MD [facility]   CC:  [name], MD

## 2021-01-03 ENCOUNTER — Ambulatory Visit (HOSPITAL_BASED_OUTPATIENT_CLINIC_OR_DEPARTMENT_OTHER)
Admission: RE | Admit: 2021-01-03 | Discharge: 2021-01-03 | Disposition: A | Discharge status: Home / Self Care | Payer: Medicare Other | Source: Ambulatory Visit | Attending: Cardiology | Admitting: Cardiology

## 2021-01-03 ENCOUNTER — Ambulatory Visit (HOSPITAL_COMMUNITY)
Admission: RE | Admit: 2021-01-03 | Discharge: 2021-01-03 | Disposition: A | Discharge status: Home / Self Care | Payer: Medicare Other | Source: Ambulatory Visit | Attending: Cardiology | Admitting: Cardiology

## 2021-01-03 ENCOUNTER — Other Ambulatory Visit: Payer: Self-pay

## 2021-01-03 DIAGNOSIS — Z95828 Presence of other vascular implants and grafts: Secondary | ICD-10-CM | POA: Insufficient documentation

## 2021-01-04 ENCOUNTER — Ambulatory Visit (HOSPITAL_COMMUNITY): Admission: RE | Admit: 2021-01-04 | Payer: Medicare Other | Source: Ambulatory Visit

## 2021-01-04 NOTE — Progress Notes (Signed)
Rose [name],  Your labs looked okay.  Your blood counts improved quite a bit since your hospitalization and your iron levels were not completely consistent with iron deficiency but I suspect your blood counts would probably improve by taking an over the counter iron supplement once a day.  Your liver tests were normal.

## 2021-01-10 ENCOUNTER — Encounter (HOSPITAL_COMMUNITY): Payer: Medicare Other

## 2021-02-07 ENCOUNTER — Encounter: Payer: Self-pay | Admitting: Internal Medicine

## 2021-02-23 ENCOUNTER — Other Ambulatory Visit: Payer: Self-pay | Admitting: Internal Medicine

## 2021-02-23 DIAGNOSIS — J309 Allergic rhinitis, unspecified: Secondary | ICD-10-CM

## 2021-02-26 ENCOUNTER — Other Ambulatory Visit (HOSPITAL_COMMUNITY): Payer: Self-pay | Admitting: Cardiovascular Disease

## 2021-02-26 DIAGNOSIS — Z95828 Presence of other vascular implants and grafts: Secondary | ICD-10-CM

## 2021-02-26 DIAGNOSIS — I739 Peripheral vascular disease, unspecified: Secondary | ICD-10-CM

## 2021-03-13 ENCOUNTER — Other Ambulatory Visit: Payer: Self-pay

## 2021-03-13 DIAGNOSIS — Z794 Long term (current) use of insulin: Secondary | ICD-10-CM

## 2021-04-02 ENCOUNTER — Ambulatory Visit (INDEPENDENT_AMBULATORY_CARE_PROVIDER_SITE_OTHER): Payer: Commercial Managed Care - HMO | Admitting: Internal Medicine

## 2021-04-02 ENCOUNTER — Encounter: Payer: Self-pay | Admitting: Internal Medicine

## 2021-04-02 DIAGNOSIS — F17200 Nicotine dependence, unspecified, uncomplicated: Secondary | ICD-10-CM | POA: Diagnosis not present

## 2021-04-02 DIAGNOSIS — I25118 Atherosclerotic heart disease of native coronary artery with other forms of angina pectoris: Secondary | ICD-10-CM | POA: Diagnosis not present

## 2021-04-02 DIAGNOSIS — G8929 Other chronic pain: Secondary | ICD-10-CM | POA: Diagnosis not present

## 2021-04-02 DIAGNOSIS — M5441 Lumbago with sciatica, right side: Secondary | ICD-10-CM | POA: Diagnosis not present

## 2021-04-02 DIAGNOSIS — E1142 Type 2 diabetes mellitus with diabetic polyneuropathy: Secondary | ICD-10-CM | POA: Diagnosis not present

## 2021-04-02 MED ORDER — DULOXETINE HCL 20 MG PO CPEP: 60.0000 mg | ORAL_CAPSULE | Freq: Every day | ORAL | 2 refills | Status: DC

## 2021-04-02 MED ORDER — CARVEDILOL 6.25 MG PO TABS: 6.2500 mg | ORAL_TABLET | Freq: Two times a day (BID) | ORAL | 11 refills | Status: DC

## 2021-04-02 MED ORDER — CYCLOBENZAPRINE HCL 5 MG PO TABS: 5.0000 mg | ORAL_TABLET | Freq: Every day | ORAL | 0 refills | Status: DC | PRN

## 2021-04-02 MED ORDER — VARENICLINE TARTRATE 0.5 MG PO TABS: ORAL_TABLET | ORAL | 0 refills | Status: AC

## 2021-04-02 NOTE — Assessment & Plan Note (Signed)
Patient presents to clinic for follow-up on her chronic back and neck pain. She states that this has been ongoing since 2019.  She states that her Flexeril and Cymbalta provide some alleviation of her symptoms.  She states she did have a steroid injection by orthopedist last year, that helped alleviate her pain also helped her stand straighter which appear to alleviate her pain as well.  She states that her right hip and lower back prevent worsening pain and she has had several falls in the past 4 weeks.  She states that during these events she was not on her Flexeril or methadone as she knows these can be side effects prophylaxis. She states that she is able to ambulate with a cane, and uses a cane at maximum height to help her to stop bending over. She has seen PT in the past, but has been unable to follow through with the exercises regimen.    A/P:  Patient presents with chronic back pain and hip pain. Physical examination is unremarkable, negative straight leg test, and full range of motion. There is tenderness along the paraspinal musculature of the lumbar region, right buttock, and SI joint. She is flexed at the hips, and is using her cane incorrectly. Given her poor posture this is likely the etiology of her falling episodes given that she was not on her flexeril or methadone.  We discussed the importance of strengthening her lower back and abdominal muscles to correct her posture.  - Referral to PT - Refill Flexeril 5 mg - Refill Cymbalta 60 mg

## 2021-04-02 NOTE — Assessment & Plan Note (Signed)
Patient successfully quit smoking approximately 6 months after PCP follow-up.  She states that the death of her sister last 04-12-23 was a nidus to restart smoking. She is currently taking patches, but does not feel like they are helping. She is interested in other treatments. We discussed Chantix, and she would like to proceed with this treatment.  - Chantix

## 2021-04-02 NOTE — Progress Notes (Signed)
CC: Chronic hip pain  HPI:  Rose [name] is a 66 y.o. person, with a PMH noted below, who presents to the clinic for follow-up for chronic pain. To see the management of their acute and chronic conditions, please see the A&P note under the Encounters tab.   Past Medical History:  Diagnosis Date   ACE inhibitor-aggravated angioedema, initial encounter 04/26/2016   Allergic rhinitis    Anxiety    Arthritis    Calculus of gallbladder without cholecystitis without obstruction 10/28/2018   Chronic back pain    CKD (chronic kidney disease), stage III ([place])    folllowed by pcp   Coronary artery disease    Depression    Diabetic neuropathy ([place])    Edema of lower extremity 10/28/2018   Encounter for completion of form with patient 06/21/2019   Environmental allergies    Environmental allergies 12/28/2015   Fibromyalgia    Foot ulcer secondary to diabetes 06/21/2019   Frequency of urination    History of chronic bronchitis    History of drug abuse ([place]) states quit herion in 2006   History of hepatitis C last abd ultrasound in epic 11-10-2018;  last hepative panel in epic 11-30-2018 was normal   previously followed by [facility] (notes in care everywhere)-- dx 04/ 2015,  started harvonic treatment 07/ 2015   History of rib fracture 10/28/2018   HPV in female 06/11/2016   Hypertension    followed by pcp  (nuclear stress test 01-15-2017 in epic,  showed low risk normal w/ nuclear ef 55%)   Laceration of right foot 03/04/2019   Lipoma of neck 07/16/2017   Seen by general surgery Dr Marlou Starks May 16 - plan for removal of 2cm sebaceous cyst.    Methadone maintenance therapy patient ([place])    Mild asthma    followed by pcp   Nocturia    PAD (peripheral artery disease) ([place])    followed by cardiology, dr Fletcher Anon---  06-01-2019  s/p bilateral CIA angioplasty stenting   Recurrent falls 04/30/2016   Right foot ulcer ([place])    Symptomatic anemia 11/15/2020   Tendonitis     bilateral wrist   Type 2 diabetes mellitus ([place])    followed by pcp   (06-16-2019  per pt checks blood sugar every other day in am,  fasting sugar 92-95)   Urge urinary incontinence    Review of Systems:   Review of Systems  Constitutional:  Negative for chills, fever, malaise/fatigue and weight loss.  Eyes:  Negative for blurred vision, double vision, photophobia, pain, discharge and redness.  Cardiovascular:  Negative for chest pain, palpitations, orthopnea and claudication.  Gastrointestinal:  Negative for abdominal pain, diarrhea, nausea and vomiting.  Musculoskeletal:  Positive for back pain. Negative for falls and joint pain.       Hip and back pain  Neurological:  Negative for dizziness and headaches.    Physical Exam:  Vitals:   04/02/21 1017 04/02/21 1025  BP: (!) 149/79 (!) 143/72  Pulse: 75 74  Temp: 97.8 F (36.6 C)   TempSrc: Oral   SpO2: 100%   Weight: 153 lb 12.8 oz (69.8 kg)    Physical Exam Constitutional:      General: She is not in acute distress.    Appearance: Normal appearance. She is not ill-appearing.  HENT:     Head: Normocephalic and atraumatic.  Cardiovascular:     Rate and Rhythm: Normal rate and regular rhythm.  Pulses: Normal pulses.     Heart sounds: Normal heart sounds.  Pulmonary:     Effort: Pulmonary effort is normal.     Breath sounds: Normal breath sounds.  Musculoskeletal:     Right lower leg: No edema.     Left lower leg: No edema.  Skin:    General: Skin is warm.     Capillary Refill: Capillary refill takes less than 2 seconds.  Neurological:     General: No focal deficit present.     Mental Status: She is alert and oriented to person, place, and time.     Sensory: No sensory deficit.     Motor: No weakness.     Coordination: Coordination normal.     Gait: Gait normal.     Deep Tendon Reflexes: Reflexes normal.  Psychiatric:        Mood and Affect: Mood normal.        Behavior: Behavior normal.     Assessment &  Plan:   See Encounters Tab for problem based charting.  Patient discussed with Dr. Jimmye Norman

## 2021-04-02 NOTE — Patient Instructions (Addendum)
To Ms. [place],   It was a pleasure seeing you today. Today we discussed your medication refills, hip pain, and smoking cessation.   For your medication, I have refilled your cymbalta, flexeril, and carvedilol.   For your hip pain, I will refer you to physical therapy for back and hip strengthening exercises.   For your smoking, I am glad you are trying to quit. I will prescribe you a medication a medication for Chantix. Please take it as directed.   Have a Happy New Year,  Maudie Mercury, MD

## 2021-04-06 NOTE — Progress Notes (Signed)
Internal Medicine Clinic Attending  I saw and evaluated the patient.  I personally confirmed the key portions of the history and exam documented by Dr. Gilford Rile and I reviewed pertinent patient test results.  The assessment, diagnosis, and plan were formulated together and I agree with the documentation in the residents note.  Tight hamstrings prevent her from being able to stand upright without flexing at the knees; she can fully extend her knees only when flexed at the waist.  Improper use of cane as well.  Pt is indeed the most appropriate intervention, and she will need to invest in exercises consistently.

## 2021-04-09 ENCOUNTER — Ambulatory Visit: Payer: Medicare Other | Admitting: Cardiovascular Disease

## 2021-04-30 DIAGNOSIS — E1136 Type 2 diabetes mellitus with diabetic cataract: Secondary | ICD-10-CM | POA: Diagnosis not present

## 2021-04-30 DIAGNOSIS — H25813 Combined forms of age-related cataract, bilateral: Secondary | ICD-10-CM | POA: Diagnosis not present

## 2021-04-30 LAB — HM DIABETES EYE EXAM

## 2021-05-15 DIAGNOSIS — H524 Presbyopia: Secondary | ICD-10-CM | POA: Diagnosis not present

## 2021-05-15 DIAGNOSIS — H52203 Unspecified astigmatism, bilateral: Secondary | ICD-10-CM | POA: Diagnosis not present

## 2021-05-15 DIAGNOSIS — H5213 Myopia, bilateral: Secondary | ICD-10-CM | POA: Diagnosis not present

## 2021-05-28 ENCOUNTER — Ambulatory Visit (INDEPENDENT_AMBULATORY_CARE_PROVIDER_SITE_OTHER): Payer: Commercial Managed Care - HMO | Admitting: Cardiovascular Disease

## 2021-05-28 ENCOUNTER — Other Ambulatory Visit: Payer: Self-pay

## 2021-05-28 ENCOUNTER — Encounter: Payer: Self-pay | Admitting: Cardiovascular Disease

## 2021-05-28 VITALS — BP 130/70 | HR 80 | Resp 20 | Ht 63.0 in | Wt 152.4 lb

## 2021-05-28 DIAGNOSIS — I739 Peripheral vascular disease, unspecified: Secondary | ICD-10-CM

## 2021-05-28 DIAGNOSIS — Z720 Tobacco use: Secondary | ICD-10-CM

## 2021-05-28 DIAGNOSIS — I2510 Atherosclerotic heart disease of native coronary artery without angina pectoris: Secondary | ICD-10-CM

## 2021-05-28 DIAGNOSIS — I10 Essential (primary) hypertension: Secondary | ICD-10-CM

## 2021-05-28 DIAGNOSIS — E785 Hyperlipidemia, unspecified: Secondary | ICD-10-CM

## 2021-05-28 NOTE — Progress Notes (Signed)
Cardiology Office Note   Date:  05/28/2021   ID:  Rose [name], Rose [name] 21-Nov-1955, MRN [id number]  PCP:  [name], MD  Cardiologist:   [name], MD   No chief complaint on file.      History of Present Illness: Rose [name] is a 66 y.o. female who is here today for follow-up visit regarding peripheral arterial disease and coronary artery disease.  The patient has multiple chronic medical conditions that include type 2 diabetes, essential hypertension, hyperlipidemia, remote IV drug use and tobacco use. She is status post bilateral common iliac artery stenting done in March 2021.  She had no significant infrainguinal disease at that time.  She had an ulceration on the right lateral foot at that time.   She was seen in September of 2021 for exertional chest pain.  She underwent a Lexiscan Myoview which showed evidence of anterior and anterolateral ischemia.  Cardiac catheterization in October showed severe one-vessel coronary artery disease with 90% heavily calcified stenosis in the mid LAD and moderate left circumflex disease.  Ejection fraction was normal with mildly elevated left ventricular end-diastolic pressure.  I performed successful intravascular lithotripsy and drug eluting stent placement to the mid LAD.    She started smoking again recently after the death of her sister in 2023-04-13.  Otherwise she has been doing reasonably well and denies chest pain, shortness of breath or claudication.  She takes her medications regularly.  Past Medical History:  Diagnosis Date   ACE inhibitor-aggravated angioedema, initial encounter 04/26/2016   Allergic rhinitis    Anxiety    Arthritis    Calculus of gallbladder without cholecystitis without obstruction 10/28/2018   Chronic back pain    CKD (chronic kidney disease), stage III ([place])    folllowed by pcp   Coronary artery disease    Depression    Diabetic neuropathy ([place])    Edema of lower extremity  10/28/2018   Encounter for completion of form with patient 06/21/2019   Environmental allergies    Environmental allergies 12/28/2015   Fibromyalgia    Foot ulcer secondary to diabetes 06/21/2019   Frequency of urination    History of chronic bronchitis    History of drug abuse ([place]) states quit herion in 2006   History of hepatitis C last abd ultrasound in epic 11-10-2018;  last hepative panel in epic 11-30-2018 was normal   previously followed by [facility] (notes in care everywhere)-- dx 04/ 2015,  started harvonic treatment 07/ 2015   History of rib fracture 10/28/2018   HPV in female 06/11/2016   Hypertension    followed by pcp  (nuclear stress test 01-15-2017 in epic,  showed low risk normal w/ nuclear ef 55%)   Laceration of right foot 03/04/2019   Lipoma of neck 07/16/2017   Seen by general surgery Dr Marlou Starks May 16 - plan for removal of 2cm sebaceous cyst.    Methadone maintenance therapy patient ([place])    Mild asthma    followed by pcp   Nocturia    PAD (peripheral artery disease) ([place])    followed by cardiology, dr Fletcher Anon---  06-01-2019  s/p bilateral CIA angioplasty stenting   Recurrent falls 04/30/2016   Right foot ulcer ([place])    Symptomatic anemia 11/15/2020   Tendonitis    bilateral wrist   Type 2 diabetes mellitus ([place])    followed by pcp   (06-16-2019  per pt checks blood sugar every other day in am,  fasting sugar 92-95)   Urge urinary incontinence     Past Surgical History:  Procedure Laterality Date   ABDOMINAL AORTOGRAM W/LOWER EXTREMITY Bilateral 06/01/2019   Procedure: ABDOMINAL AORTOGRAM W/LOWER EXTREMITY;  Surgeon: Wellington Hampshire, MD;  Location: [place] [facility];  Service: Cardiovascular;  Laterality: Bilateral;   APPENDECTOMY  1972   INTERSTIM IMPLANT PLACEMENT  10/23/2011   Procedure: Barrie Lyme IMPLANT FIRST STAGE;  Surgeon: Reece Packer, MD;  Location: [facility];  Service: Urology;  Laterality: N/A;   INTERSTIM IMPLANT  PLACEMENT  10/23/2011   Procedure: Barrie Lyme IMPLANT SECOND STAGE;  Surgeon: Reece Packer, MD;  Location: [facility];  Service: Urology;  Laterality: N/A;   LEFT HEART CATH AND CORONARY ANGIOGRAPHY N/A 01/11/2020   Procedure: LEFT HEART CATH AND CORONARY ANGIOGRAPHY;  Surgeon: Wellington Hampshire, MD;  Location: [place] [facility];  Service: Cardiovascular;  Laterality: N/A;   PANACOS TISSUE GRAFT Right 06/24/2019   Procedure: Application of Skin Graft Substitute;  Surgeon: Evelina Bucy, DPM;  Location: [place];  Service: Podiatry;  Laterality: Right;   PERIPHERAL VASCULAR INTERVENTION Bilateral 06/01/2019   Procedure: PERIPHERAL VASCULAR INTERVENTION;  Surgeon: Wellington Hampshire, MD;  Location: [place] [facility];  Service: Cardiovascular;  Laterality: Bilateral;   WOUND DEBRIDEMENT Right 06/24/2019   Procedure: DEBRIDEMENT WOUND;  Surgeon: Evelina Bucy, DPM;  Location: [place];  Service: Podiatry;  Laterality: Right;   WOUND DEBRIDEMENT Right 08/24/2019   Procedure: DEBRIDEMENT RIGHT FOOT WOUND; APPLICATION OF SKIN GRAFT SUBSTITUTE;  Surgeon: Evelina Bucy, DPM;  Location: [place];  Service: Podiatry;  Laterality: Right;     Current Outpatient Medications  Medication Sig Dispense Refill   albuterol (VENTOLIN HFA) 108 (90 Base) MCG/ACT inhaler Inhale 2 puffs into the lungs every 6 (six) hours as needed. For wheezing 1 each 2   atorvastatin (LIPITOR) 40 MG tablet TAKE 1 TABLET(40 MG) BY MOUTH DAILY 90 tablet 6   atorvastatin (LIPITOR) 80 MG tablet Take 1 tablet (80 mg total) by mouth daily. 30 tablet 11   carvedilol (COREG) 6.25 MG tablet Take 1 tablet (6.25 mg total) by mouth 2 (two) times daily. 60 tablet 11   cetirizine (ZYRTEC) 10 MG tablet Take 1 tablet (10 mg total) by mouth daily. 30 tablet 11   clopidogrel (PLAVIX) 75 MG tablet Take 1 tablet (75 mg total) by mouth daily with breakfast. 90 tablet 3    cyclobenzaprine (FLEXERIL) 5 MG tablet Take 1 tablet (5 mg total) by mouth daily as needed for up to 30 doses for muscle spasms. 30 tablet 0   diclofenac Sodium (VOLTAREN) 1 % GEL Apply 2 g topically 4 (four) times daily as needed (joint pain).     DULoxetine (CYMBALTA) 20 MG capsule Take 3 capsules (60 mg total) by mouth daily. 90 capsule 2   fluticasone (FLONASE) 50 MCG/ACT nasal spray SHAKE LIQUID AND USE 1 SPRAY IN EACH NOSTRIL DAILY 16 g 5   hydrochlorothiazide (HYDRODIURIL) 25 MG tablet Take 25 mg by mouth daily.     metFORMIN (GLUCOPHAGE) 1000 MG tablet TAKE 1 TABLET(1000 MG) BY MOUTH TWICE DAILY WITH A MEAL 60 tablet 5   methadone (DOLOPHINE) 10 MG/5ML solution Take 126 mg by mouth daily.      Multiple Vitamins-Minerals (CENTRUM WOMEN) TABS Take 1 tablet by mouth daily.     nicotine (NICODERM CQ - DOSED IN MG/24 HOURS) 14 mg/24hr patch Medicaid [id number] L Wks  1-6: 14 mg x 1 patch daily. Wear for 24 hours. If you have sleep disturbances, remove at bedtime. (Patient taking differently: Place 14 mg onto the skin See admin instructions. Medicaid [id number] L Wks 1-6: 14 mg x 1 patch daily. Wear for 24 hours. If you have sleep disturbances, remove at bedtime.) 30 patch 2   No current facility-administered medications for this visit.    Allergies:   Lisinopril, Gabapentin, Vicodin [hydrocodone-acetaminophen], and Hydrocodone-acetaminophen    Social History:  The patient  reports that she has been smoking cigarettes. She has a 10.00 pack-year smoking history. She has never used smokeless tobacco. She reports that she does not currently use drugs. She reports that she does not drink alcohol.   Family History:  The patient's family history includes CAD in her father; Diabetes type II in her brother, mother, and sister; Heart attack (age of onset: 73) in her brother; Heart attack (age of onset: 75) in her father and mother; Hypertension in her brother and mother.    ROS:  Please see the history of  present illness.   Otherwise, review of systems are positive for none.   All other systems are reviewed and negative.    PHYSICAL EXAM: VS:  BP 130/70 (BP Location: Left Arm, Patient Position: Sitting, [name] Size: Normal)    Pulse 80    Resp 20    Ht 5\' 3"  (1.6 m)    Wt 152 lb 6.4 oz (69.1 kg)    BMI 27.00 kg/m  , BMI Body mass index is 27 kg/m. GEN: Well nourished, well developed, in no acute distress  HEENT: normal  Neck: no JVD, carotid bruits, or masses Cardiac: RRR; no murmurs, rubs, or gallops, mild bilateral leg edema Respiratory:  clear to auscultation bilaterally, normal work of breathing GI: soft, nontender, nondistended, + BS MS: no deformity or atrophy  Skin: warm and dry, no rash Neuro:  Strength and sensation are intact Psych: euthymic mood, full affect Vascular: Posterior tibial pulses palpable bilaterally.   EKG:  EKG  Is ordered today. EKG showed normal sinus rhythm septal infarct.  No acute changes.  Recent Labs: 11/16/2020: BUN 10; Creatinine, Ser 1.05; Potassium 3.8; Sodium 132 12/26/2020: ALT 10; Hemoglobin 10.6; Platelets 234.0    Lipid Panel    Component Value Date/Time   CHOL 83 (L) 11/30/2018 0912   TRIG 109 11/30/2018 0912   HDL 31 (L) 11/30/2018 0912   CHOLHDL 2.7 11/30/2018 0912   LDLCALC 32 11/30/2018 0912      Wt Readings from Last 3 Encounters:  05/28/21 152 lb 6.4 oz (69.1 kg)  04/02/21 153 lb 12.8 oz (69.8 kg)  12/26/20 155 lb (70.3 kg)        PAD Screen 12/19/2016  Previous PAD dx? No  Previous surgical procedure? No  Pain with walking? No  Feet/toe relief with dangling? Yes  Painful, non-healing ulcers? No  Extremities discolored? No      ASSESSMENT AND PLAN:  1.  Coronary artery disease involving native coronary arteries without angina : She is doing well with no anginal symptoms.  Continue medical therapy.  2. Peripheral arterial disease: Status post  stent placement to bilateral common iliac arteries.  No significant  infrainguinal disease.  Most recent Doppler showed patent iliac stents.  Repeat studies in October of this year.  3.  Tobacco use: She relapsed with smoking recently after the death of her sister.  I discussed the importance of smoking cessation.  4.  Hyperlipidemia: Continue treatment with  atorvastatin with a target LDL of less than 70.  Most recent lipid profile in September of 2020 showed an LDL of 32.  5.  Essential hypertension: Blood pressure is controlled.    Disposition:   FU with me in 6 months  Signed,  [name], MD  05/28/2021 5:19 PM    [place]

## 2021-05-28 NOTE — Patient Instructions (Signed)
Medication Instructions:  Your physician recommends that you continue on your current medications as directed. Please refer to the Current Medication list given to you today.   *If you need a refill on your cardiac medications before your next appointment, please call your pharmacy*   Lab Work: NONE ordered at this time of appointment   If you have labs (blood work) drawn today and your tests are completely normal, you will receive your results only by: Guy (if you have MyChart) OR A paper copy in the mail If you have any lab test that is abnormal or we need to change your treatment, we will call you to review the results.   Testing/Procedures: NONE ordered at this time of appointment     Follow-Up: At [facility], you and your health needs are our priority.  As part of our continuing mission to provide you with exceptional heart care, we have created designated Provider Care Teams.  These Care Teams include your primary Cardiologist (physician) and Advanced Practice Providers (APPs -  Physician Assistants and Nurse Practitioners) who all work together to provide you with the care you need, when you need it.  We recommend signing up for the patient portal called "MyChart".  Sign up information is provided on this After Visit Summary.  MyChart is used to connect with patients for Virtual Visits (Telemedicine).  Patients are able to view lab/test results, encounter notes, upcoming appointments, etc.  Non-urgent messages can be sent to your provider as well.   To learn more about what you can do with MyChart, go to [email or web address].    Your next appointment:   6 month(s)  The format for your next appointment:   In Person  Provider:   [name], MD     Other Instructions

## 2021-06-03 ENCOUNTER — Other Ambulatory Visit: Payer: Self-pay | Admitting: Internal Medicine

## 2021-06-03 DIAGNOSIS — M5441 Lumbago with sciatica, right side: Secondary | ICD-10-CM

## 2021-06-03 DIAGNOSIS — G8929 Other chronic pain: Secondary | ICD-10-CM

## 2021-06-03 NOTE — Telephone Encounter (Signed)
Refill Request ? ? ?atorvastatin (LIPITOR) 40 MG tablet ? ?atorvastatin (LIPITOR) 80 MG tablet ? ?carvedilol (COREG) 6.25 MG table ? ?fluticasone (FLONASE) 50 MCG/ACT nasal spray ? ?cyclobenzaprine (FLEXERIL) 5 MG tablet ? ?DULoxetine (CYMBALTA) 20 MG capsule ? ?[id number] [address], [place] - [place] (Ph: [phone]) ? ? ?

## 2021-06-03 NOTE — Telephone Encounter (Signed)
Patient has refills on all meds at [facility] except flexeril. Attempted to notify patient twice to contact pharmacy for refill and line goes dead. If patient calls back, please have her call pharmacy for refills.Will forward request for flexeril. ?

## 2021-06-05 MED ORDER — CYCLOBENZAPRINE HCL 5 MG PO TABS: 5.0000 mg | ORAL_TABLET | Freq: Every day | ORAL | 0 refills | Status: DC | PRN

## 2021-06-06 ENCOUNTER — Encounter: Payer: Self-pay | Admitting: Dietician

## 2021-06-13 DIAGNOSIS — H524 Presbyopia: Secondary | ICD-10-CM | POA: Diagnosis not present

## 2021-07-01 ENCOUNTER — Encounter: Payer: Self-pay | Admitting: Internal Medicine

## 2021-07-01 ENCOUNTER — Ambulatory Visit (INDEPENDENT_AMBULATORY_CARE_PROVIDER_SITE_OTHER): Payer: Medicare Other | Admitting: Internal Medicine

## 2021-07-01 VITALS — BP 131/63 | HR 78 | Temp 98.2°F | Ht 63.0 in | Wt 158.7 lb

## 2021-07-01 DIAGNOSIS — W19XXXA Unspecified fall, initial encounter: Secondary | ICD-10-CM | POA: Insufficient documentation

## 2021-07-01 DIAGNOSIS — G8929 Other chronic pain: Secondary | ICD-10-CM

## 2021-07-01 DIAGNOSIS — I10 Essential (primary) hypertension: Secondary | ICD-10-CM | POA: Diagnosis not present

## 2021-07-01 DIAGNOSIS — E611 Iron deficiency: Secondary | ICD-10-CM

## 2021-07-01 DIAGNOSIS — D500 Iron deficiency anemia secondary to blood loss (chronic): Secondary | ICD-10-CM | POA: Diagnosis not present

## 2021-07-01 DIAGNOSIS — E1169 Type 2 diabetes mellitus with other specified complication: Secondary | ICD-10-CM | POA: Diagnosis not present

## 2021-07-01 DIAGNOSIS — M5441 Lumbago with sciatica, right side: Secondary | ICD-10-CM | POA: Diagnosis not present

## 2021-07-01 LAB — POCT GLYCOSYLATED HEMOGLOBIN (HGB A1C): Hemoglobin A1C: 6.3 % — AB (ref 4.0–5.6)

## 2021-07-01 LAB — GLUCOSE, CAPILLARY: Glucose-Capillary: 74 mg/dL (ref 70–99)

## 2021-07-01 NOTE — Patient Instructions (Signed)
Thank you, [name] for allowing [place] to provide your care today. Today we discussed fall, anemia, diabetes.   ? ?Labs/Tests Ordered: ? ?Lab Orders    ?     BMP8+Anion Gap    ?     Glucose, capillary    ?     Ferritin    ?     POC Hbg A1C     ? ?Referrals Ordered:  ?Referral Orders  ?No referral(s) requested today  ?  ? ?Medication Changes:  ?There are no discontinued medications.  ? ?No orders of the defined types were placed in this encounter. ?  ? ?Health Maintenance Screening: ?Diabetes Health Maintenance Due  ?Topic Date Due  ? HEMOGLOBIN A1C  06/23/2021  ? URINE MICROALBUMIN  07/19/2021  ? FOOT EXAM  07/27/2021  ? OPHTHALMOLOGY EXAM  04/30/2022  ?  ? ?Instructions: keep up the good work. ? ?Follow up: 3 months  ? ?Remember: If you have any questions or concerns, call our clinic at [phone] or after hours call [phone] and ask for the internal medicine resident on call. ? ?Rose [name], D.O. ?[place] ? ?  ?

## 2021-07-01 NOTE — Assessment & Plan Note (Signed)
Patient has a history of iron deficiency anemia with decreased percent saturation and ferritin. She is not taking oral iron supplementation.  ? ?Plan: ?- Repeat Ferritin today ?- Start oral iron supplementation if needed.  ?

## 2021-07-01 NOTE — Assessment & Plan Note (Signed)
Will get a kidney function and electrolytes today.  ?

## 2021-07-01 NOTE — Progress Notes (Signed)
? ? ?Subjective:  ?CC: DM ? ?HPI: ? ?[name] is a 66 y.o. female with a past medical history stated below and presents today for DM. Please see problem based assessment and plan for additional details. ? ?Past Medical History:  ?Diagnosis Date  ? ACE inhibitor-aggravated angioedema, initial encounter 04/26/2016  ? Allergic rhinitis   ? Anxiety   ? Arthritis   ? Calculus of gallbladder without cholecystitis without obstruction 10/28/2018  ? Chronic back pain   ? CKD (chronic kidney disease), stage III ([place])   ? folllowed by pcp  ? Coronary artery disease   ? Depression   ? Diabetic neuropathy ([place])   ? Edema of lower extremity 10/28/2018  ? Encounter for completion of form with patient 06/21/2019  ? Environmental allergies   ? Environmental allergies 12/28/2015  ? Fibromyalgia   ? Foot ulcer secondary to diabetes 06/21/2019  ? Frequency of urination   ? History of chronic bronchitis   ? History of drug abuse ([place]) states quit herion in 2006  ? History of hepatitis C last abd ultrasound in epic 11-10-2018;  last hepative panel in epic 11-30-2018 was normal  ? previously followed by [facility] (notes in care everywhere)-- dx 04/ 2015,  started harvonic treatment 07/ 2015  ? History of rib fracture 10/28/2018  ? HPV in female 06/11/2016  ? Hypertension   ? followed by pcp  (nuclear stress test 01-15-2017 in epic,  showed low risk normal w/ nuclear ef 55%)  ? Laceration of right foot 03/04/2019  ? Lipoma of neck 07/16/2017  ? Seen by general surgery Dr Marlou Starks May 16 - plan for removal of 2cm sebaceous cyst.   ? Methadone maintenance therapy patient [facility])   ? Mild asthma   ? followed by pcp  ? Nocturia   ? PAD (peripheral artery disease) ([place])   ? followed by cardiology, dr Fletcher Anon---  06-01-2019  s/p bilateral CIA angioplasty stenting  ? Recurrent falls 04/30/2016  ? Right foot ulcer ([place])   ? Symptomatic anemia 11/15/2020  ? Tendonitis   ? bilateral wrist  ? Type 2 diabetes mellitus ([place])   ?  followed by pcp   (06-16-2019  per pt checks blood sugar every other day in am,  fasting sugar 92-95)  ? Urge urinary incontinence   ? ? ?Current Outpatient Medications on File Prior to Visit  ?Medication Sig Dispense Refill  ? albuterol (VENTOLIN HFA) 108 (90 Base) MCG/ACT inhaler Inhale 2 puffs into the lungs every 6 (six) hours as needed. For wheezing 1 each 2  ? atorvastatin (LIPITOR) 40 MG tablet TAKE 1 TABLET(40 MG) BY MOUTH DAILY 90 tablet 6  ? atorvastatin (LIPITOR) 80 MG tablet Take 1 tablet (80 mg total) by mouth daily. 30 tablet 11  ? carvedilol (COREG) 6.25 MG tablet Take 1 tablet (6.25 mg total) by mouth 2 (two) times daily. 60 tablet 11  ? cetirizine (ZYRTEC) 10 MG tablet Take 1 tablet (10 mg total) by mouth daily. 30 tablet 11  ? clopidogrel (PLAVIX) 75 MG tablet Take 1 tablet (75 mg total) by mouth daily with breakfast. 90 tablet 3  ? cyclobenzaprine (FLEXERIL) 5 MG tablet Take 1 tablet (5 mg total) by mouth daily as needed for up to 30 doses for muscle spasms. 30 tablet 0  ? diclofenac Sodium (VOLTAREN) 1 % GEL Apply 2 g topically 4 (four) times daily as needed (joint pain).    ? DULoxetine (CYMBALTA) 20 MG capsule Take 3  capsules (60 mg total) by mouth daily. 90 capsule 2  ? fluticasone (FLONASE) 50 MCG/ACT nasal spray SHAKE LIQUID AND USE 1 SPRAY IN EACH NOSTRIL DAILY 16 g 5  ? hydrochlorothiazide (HYDRODIURIL) 25 MG tablet Take 25 mg by mouth daily.    ? metFORMIN (GLUCOPHAGE) 1000 MG tablet TAKE 1 TABLET(1000 MG) BY MOUTH TWICE DAILY WITH A MEAL 60 tablet 5  ? methadone (DOLOPHINE) 10 MG/5ML solution Take 126 mg by mouth daily.     ? Multiple Vitamins-Minerals (CENTRUM WOMEN) TABS Take 1 tablet by mouth daily.    ? nicotine (NICODERM CQ - DOSED IN MG/24 HOURS) 14 mg/24hr patch Medicaid [id number] L Wks 1-6: 14 mg x 1 patch daily. Wear for 24 hours. If you have sleep disturbances, remove at bedtime. (Patient taking differently: Place 14 mg onto the skin See admin instructions. Medicaid [id number] L  Wks 1-6: 14 mg x 1 patch daily. Wear for 24 hours. If you have sleep disturbances, remove at bedtime.) 30 patch 2  ? ?No current facility-administered medications on file prior to visit.  ? ? ?Family History  ?Problem Relation Age of Onset  ? Hypertension Mother   ? Diabetes type II Mother   ? Heart attack Mother 57  ? CAD Father   ? Heart attack Father 60  ? Hypertension Brother   ? Diabetes type II Brother   ? Heart attack Brother 61  ? Diabetes type II Sister   ? ? ?Social History  ? ?Socioeconomic History  ? Marital status: Single  ?  Spouse name: Not on file  ? Number of children: Not on file  ? Years of education: Not on file  ? Highest education level: Not on file  ?Occupational History  ? Not on file  ?Tobacco Use  ? Smoking status: Light Smoker  ?  Packs/day: 0.50  ?  Years: 20.00  ?  Pack years: 10.00  ?  Types: Cigarettes  ?  Last attempt to quit: 06/07/2020  ?  Years since quitting: 1.0  ? Smokeless tobacco: Never  ?Vaping Use  ? Vaping Use: Never used  ?Substance and Sexual Activity  ? Alcohol use: No  ?  Alcohol/week: 0.0 standard drinks  ? Drug use: Not Currently  ?  Comment: hx heroin use, IV,  last used 2006 (currently on methadone)  ? Sexual activity: Not Currently  ?Other Topics Concern  ? Not on file  ?Social History Narrative  ? Not on file  ? ?Social Determinants of Health  ? ?Financial Resource Strain: Not on file  ?Food Insecurity: Not on file  ?Transportation Needs: Not on file  ?Physical Activity: Not on file  ?Stress: Not on file  ?Social Connections: Not on file  ?Intimate Partner Violence: Not on file  ? ? ?Review of Systems: ?ROS negative except for what is noted on the assessment and plan. ? ?Objective:  ? ?Vitals:  ? 07/01/21 1014  ?BP: 131/63  ?Pulse: 78  ?Temp: 98.2 ?F (36.8 ?C)  ?TempSrc: Oral  ?SpO2: 99%  ?Weight: 158 lb 11.2 oz (72 kg)  ?Height: '5\' 3"'$  (1.6 m)  ? ? ?Physical Exam: ?Gen: A&O x3 and in no apparent distress, well appearing and nourished. ?Neck: no masses or nodules,  AROM intact. ?CV: RRR, no murmurs, S1/S2 presents  ?Resp: Clear to ascultation bilaterally  ?Abd: BS (+) x4, soft, non-tender abdomen, without hepatosplenomegaly or masses ?MSK: Grossly normal AROM and strength x4 extremities. Mild TTP of left hip ?Skin: good skin turgor, no rashes, unusual  bruising, or prominent lesions.  ?Neuro: No focal deficits, grossly normal sensation and coordination.  ? ? ? ?Assessment & Plan:  ?See Encounters Tab for problem based charting. ? ?Patient discussed with Dr. Angelia Mould ? ? ?[name], D.O. ?[facility]  PGY-3 ?Pager: [phone]  Phone: [phone] ?Date 07/01/2021  Time 2:24 PM  ?

## 2021-07-01 NOTE — Assessment & Plan Note (Signed)
Patient presents for further evaluation of a recent fall. She states that she tripped over a cord 3-4 days ago. She fell on her left hip with some residual pain. She denies any weakness or change in sensation. On exam she has good active and passive range of motion and is neurovascularly intact. She has a negative faddir test.  ? ?Plan:  ?- Activity modifications ?- Discussed reevaluation if pain persists. She may need XR due to history of methadone use.  ?

## 2021-07-01 NOTE — Assessment & Plan Note (Addendum)
Presents for follow-up evaluation for her diabetes.  Her diabetes is complicated by neuropathy and CKD with proteinuria. She is tolerating her diabetes medication swell without symptoms. She is up to date with her eye and foot exams.  ? ? ?  Latest Ref Rng & Units 07/01/2021  ? 11:02 AM 12/24/2020  ?  3:21 PM 07/19/2020  ? 12:09 PM  ?Hemoglobin A1C  ?Hemoglobin-A1c 4.0 - 5.6 % 6.3   5.6   6.3    ?  ? ?Plan: ?- Cont current diabetic medications.  ?

## 2021-07-02 ENCOUNTER — Telehealth: Payer: Self-pay

## 2021-07-02 LAB — BMP8+ANION GAP
Anion Gap: 15 mmol/L (ref 10.0–18.0)
BUN/Creatinine Ratio: 11 — ABNORMAL LOW (ref 12–28)
BUN: 15 mg/dL (ref 8–27)
CO2: 24 mmol/L (ref 20–29)
Calcium: 9.3 mg/dL (ref 8.7–10.3)
Chloride: 93 mmol/L — ABNORMAL LOW (ref 96–106)
Creatinine, Ser: 1.32 mg/dL — ABNORMAL HIGH (ref 0.57–1.00)
Glucose: 86 mg/dL (ref 70–99)
Potassium: 3.7 mmol/L (ref 3.5–5.2)
Sodium: 132 mmol/L — ABNORMAL LOW (ref 134–144)
eGFR: 45 mL/min/{1.73_m2} — ABNORMAL LOW (ref >59)

## 2021-07-02 LAB — FERRITIN: Ferritin: 38 ng/mL (ref 15–150)

## 2021-07-02 NOTE — Telephone Encounter (Signed)
Pt is requesting a call back .. she stated she forgot to ask her PCP yesterday to  write her an Rx for an upright walker  ?

## 2021-07-02 NOTE — Telephone Encounter (Signed)
I will put in a DME order for a walker.

## 2021-07-02 NOTE — Progress Notes (Signed)
Internal Medicine Clinic Attending  Case discussed with Dr. Coe  At the time of the visit.  We reviewed the resident's history and exam and pertinent patient test results.  I agree with the assessment, diagnosis, and plan of care documented in the resident's note.  

## 2021-07-03 NOTE — Assessment & Plan Note (Signed)
Patient has chronic back pain that limits her mobility. Specifically he pain contributes to gait weakness and instability. She currently uses a cain but continues to have difficult with ambulating short distances safely. Therefore, should would benefit from a walker.  ? ? ?Plan: ?- Will order DME walker ?

## 2021-07-03 NOTE — Addendum Note (Signed)
Addended by: Lawerance Cruel on: 07/03/2021 08:42 AM ? ? Modules accepted: Orders ? ?

## 2021-07-10 DIAGNOSIS — M5431 Sciatica, right side: Secondary | ICD-10-CM | POA: Diagnosis not present

## 2021-07-15 ENCOUNTER — Ambulatory Visit (INDEPENDENT_AMBULATORY_CARE_PROVIDER_SITE_OTHER): Payer: Medicare Other | Admitting: Internal Medicine

## 2021-07-15 ENCOUNTER — Encounter: Payer: Self-pay | Admitting: Student in an Organized Health Care Education/Training Program

## 2021-07-15 ENCOUNTER — Telehealth: Payer: Self-pay

## 2021-07-15 DIAGNOSIS — Z0000 Encounter for general adult medical examination without abnormal findings: Secondary | ICD-10-CM | POA: Diagnosis not present

## 2021-07-15 DIAGNOSIS — W19XXXA Unspecified fall, initial encounter: Secondary | ICD-10-CM

## 2021-07-15 NOTE — Telephone Encounter (Signed)
Completed AWV with the patient today, patient expressed that she received the wrong walker and does not like the one she has, she would like a walker with the seat so she is able to sit on it. ?

## 2021-07-15 NOTE — Telephone Encounter (Addendum)
Discussed with pcp and dme order for 4 wheeled walker with seat (Rollator)  will be placed ?Pt will need to contact [employer] to have them pick up rolling walker. ?CMA attempted to contact patient, no answer, mailbox full.Rose [name], Eilis Chestnutt Cassady4/17/20232:42 PM ? ?

## 2021-07-15 NOTE — Progress Notes (Signed)
? ?Subjective:  ? Rose [name] is a 66 y.o. female who presents for an Initial Medicare Annual Wellness Visit. ?I connected with  Rose [name] on 07/15/21 by a audio enabled telemedicine application and verified that I am speaking with the correct person using two identifiers. ? ?Patient Location: Home ? ?Provider Location: Office/Clinic ? ?I discussed the limitations of evaluation and management by telemedicine. The patient expressed understanding and agreed to proceed.  ?Review of Systems    ?Defer to PCP ?  ? ?   ?Objective:  ?  ?Today's Vitals  ? 07/15/21 1303  ?PainSc: 0-No pain  ? ?There is no height or weight on file to calculate BMI. ? ? ?  07/15/2021  ?  1:24 PM 12/24/2020  ?  2:08 PM 12/10/2020  ? 11:08 AM 11/20/2020  ?  3:31 PM 09/17/2020  ?  2:55 PM 09/05/2020  ? 10:55 AM 08/31/2020  ?  8:41 PM  ?Advanced Directives  ?Does Patient Have a Medical Advance Directive? No No No No No No No  ?Would patient like information on creating a medical advance directive? No - Patient declined No - Patient declined No - Patient declined No - Patient declined No - Patient declined No - Patient declined   ? ? ?Current Medications (verified) ?Outpatient Encounter Medications as of 07/15/2021  ?Medication Sig  ? albuterol (VENTOLIN HFA) 108 (90 Base) MCG/ACT inhaler Inhale 2 puffs into the lungs every 6 (six) hours as needed. For wheezing  ? atorvastatin (LIPITOR) 40 MG tablet TAKE 1 TABLET(40 MG) BY MOUTH DAILY  ? atorvastatin (LIPITOR) 80 MG tablet Take 1 tablet (80 mg total) by mouth daily.  ? carvedilol (COREG) 6.25 MG tablet Take 1 tablet (6.25 mg total) by mouth 2 (two) times daily.  ? cetirizine (ZYRTEC) 10 MG tablet Take 1 tablet (10 mg total) by mouth daily.  ? clopidogrel (PLAVIX) 75 MG tablet Take 1 tablet (75 mg total) by mouth daily with breakfast.  ? cyclobenzaprine (FLEXERIL) 5 MG tablet Take 1 tablet (5 mg total) by mouth daily as needed for up to 30 doses for muscle spasms.  ? diclofenac  Sodium (VOLTAREN) 1 % GEL Apply 2 g topically 4 (four) times daily as needed (joint pain).  ? fluticasone (FLONASE) 50 MCG/ACT nasal spray SHAKE LIQUID AND USE 1 SPRAY IN EACH NOSTRIL DAILY  ? hydrochlorothiazide (HYDRODIURIL) 25 MG tablet Take 25 mg by mouth daily.  ? metFORMIN (GLUCOPHAGE) 1000 MG tablet TAKE 1 TABLET(1000 MG) BY MOUTH TWICE DAILY WITH A MEAL  ? methadone (DOLOPHINE) 10 MG/5ML solution Take 126 mg by mouth daily.   ? Multiple Vitamins-Minerals (CENTRUM WOMEN) TABS Take 1 tablet by mouth daily.  ? nicotine (NICODERM CQ - DOSED IN MG/24 HOURS) 14 mg/24hr patch Medicaid [id number] L Wks 1-6: 14 mg x 1 patch daily. Wear for 24 hours. If you have sleep disturbances, remove at bedtime. (Patient taking differently: Place 14 mg onto the skin See admin instructions. Medicaid [id number] L Wks 1-6: 14 mg x 1 patch daily. Wear for 24 hours. If you have sleep disturbances, remove at bedtime.)  ? DULoxetine (CYMBALTA) 20 MG capsule Take 3 capsules (60 mg total) by mouth daily.  ? ?No facility-administered encounter medications on file as of 07/15/2021.  ? ? ?Allergies (verified) ?Lisinopril, Gabapentin, Vicodin [hydrocodone-acetaminophen], and Hydrocodone-acetaminophen  ? ?History: ?Past Medical History:  ?Diagnosis Date  ? ACE inhibitor-aggravated angioedema, initial encounter 04/26/2016  ? Allergic rhinitis   ? Anxiety   ? Arthritis   ?  Calculus of gallbladder without cholecystitis without obstruction 10/28/2018  ? Chronic back pain   ? CKD (chronic kidney disease), stage III ([place])   ? folllowed by pcp  ? Coronary artery disease   ? Depression   ? Diabetic neuropathy ([place])   ? Edema of lower extremity 10/28/2018  ? Encounter for completion of form with patient 06/21/2019  ? Environmental allergies   ? Environmental allergies 12/28/2015  ? Fibromyalgia   ? Foot ulcer secondary to diabetes 06/21/2019  ? Frequency of urination   ? History of chronic bronchitis   ? History of drug abuse ([place]) states quit herion in 2006  ?  History of hepatitis C last abd ultrasound in epic 11-10-2018;  last hepative panel in epic 11-30-2018 was normal  ? previously [facility] by [facility] (notes in care everywhere)-- dx 04/ 2015,  started harvonic treatment 07/ 2015  ? History of rib fracture 10/28/2018  ? HPV in female 06/11/2016  ? Hypertension   ? followed by pcp  (nuclear stress test 01-15-2017 in epic,  showed low risk normal w/ nuclear ef 55%)  ? Laceration of right foot 03/04/2019  ? Lipoma of neck 07/16/2017  ? Seen by general surgery Dr Marlou Starks May 16 - plan for removal of 2cm sebaceous cyst.   ? Methadone maintenance therapy patient [facility])   ? Mild asthma   ? followed by pcp  ? Nocturia   ? PAD (peripheral artery disease) ([place])   ? followed by cardiology, dr Fletcher Anon---  06-01-2019  s/p bilateral CIA angioplasty stenting  ? Recurrent falls 04/30/2016  ? Right foot ulcer ([place])   ? Symptomatic anemia 11/15/2020  ? Tendonitis   ? bilateral wrist  ? Type 2 diabetes mellitus ([place])   ? followed by pcp   (06-16-2019  per pt checks blood sugar every other day in am,  fasting sugar 92-95)  ? Urge urinary incontinence   ? ?Past Surgical History:  ?Procedure Laterality Date  ? ABDOMINAL AORTOGRAM W/LOWER EXTREMITY Bilateral 06/01/2019  ? Procedure: ABDOMINAL AORTOGRAM W/LOWER EXTREMITY;  Surgeon: Wellington Hampshire, MD;  Location: [place];  Service: Cardiovascular;  Laterality: Bilateral;  ? APPENDECTOMY  1972  ? INTERSTIM IMPLANT PLACEMENT  10/23/2011  ? Procedure: INTERSTIM IMPLANT FIRST STAGE;  Surgeon: Reece Packer, MD;  Location: [facility];  Service: Urology;  Laterality: N/A;  ? INTERSTIM IMPLANT PLACEMENT  10/23/2011  ? Procedure: INTERSTIM IMPLANT SECOND STAGE;  Surgeon: Reece Packer, MD;  Location: [facility];  Service: Urology;  Laterality: N/A;  ? LEFT HEART CATH AND CORONARY ANGIOGRAPHY N/A 01/11/2020  ? Procedure: LEFT HEART CATH AND CORONARY ANGIOGRAPHY;  Surgeon: Wellington Hampshire,  MD;  Location: [facility];  Service: Cardiovascular;  Laterality: N/A;  ? PANACOS TISSUE GRAFT Right 06/24/2019  ? Procedure: Application of Skin Graft Substitute;  Surgeon: Evelina Bucy, DPM;  Location: [facility];  Service: Podiatry;  Laterality: Right;  ? PERIPHERAL VASCULAR INTERVENTION Bilateral 06/01/2019  ? Procedure: PERIPHERAL VASCULAR INTERVENTION;  Surgeon: Wellington Hampshire, MD;  Location: [place] [facility];  Service: Cardiovascular;  Laterality: Bilateral;  ? WOUND DEBRIDEMENT Right 06/24/2019  ? Procedure: DEBRIDEMENT WOUND;  Surgeon: Evelina Bucy, DPM;  Location: [facility];  Service: Podiatry;  Laterality: Right;  ? WOUND DEBRIDEMENT Right 08/24/2019  ? Procedure: DEBRIDEMENT RIGHT FOOT WOUND; APPLICATION OF SKIN GRAFT SUBSTITUTE;  Surgeon: Evelina Bucy, DPM;  Location: [facility];  Service: Podiatry;  Laterality: Right;  ? ?Family History  ?Problem Relation Age of Onset  ? Hypertension Mother   ? Diabetes type II Mother   ? Heart attack Mother 36  ? CAD Father   ? Heart attack Father 65  ? Hypertension Brother   ? Diabetes type II Brother   ? Heart attack Brother 96  ? Diabetes type II Sister   ? ?Social History  ? ?Socioeconomic History  ? Marital status: Single  ?  Spouse name: Not on file  ? Number of children: Not on file  ? Years of education: Not on file  ? Highest education level: Not on file  ?Occupational History  ? Not on file  ?Tobacco Use  ? Smoking status: Some Days  ?  Packs/day: 0.50  ?  Years: 20.00  ?  Pack years: 10.00  ?  Types: Cigarettes  ? Smokeless tobacco: Never  ? Tobacco comments:  ?  SMOKES 4 CIGS PER DAY  ?Vaping Use  ? Vaping Use: Never used  ?Substance and Sexual Activity  ? Alcohol use: No  ?  Alcohol/week: 0.0 standard drinks  ? Drug use: Not Currently  ?  Comment: hx heroin use, IV,  last used 2006 (currently on methadone)  ? Sexual activity: Not Currently  ?Other Topics Concern  ? Not on file  ?Social  History Narrative  ? Not on file  ? ?Social Determinants of Health  ? ?Financial Resource Strain: Low Risk   ? Difficulty of Paying Living Expenses: Not hard at all  ?Food Insecurity: No Food Insecurity  ? W

## 2021-07-16 ENCOUNTER — Other Ambulatory Visit: Payer: Self-pay

## 2021-07-16 DIAGNOSIS — G8929 Other chronic pain: Secondary | ICD-10-CM

## 2021-07-16 NOTE — Progress Notes (Signed)
I discussed the AWV findings with the provider who conducted the visit. I was present in the office suite and immediately available to provide assistance and direction throughout the time the service was provided.  ?

## 2021-07-18 MED ORDER — CYCLOBENZAPRINE HCL 5 MG PO TABS: 5.0000 mg | ORAL_TABLET | Freq: Every day | ORAL | 0 refills | Status: DC | PRN

## 2021-07-22 ENCOUNTER — Other Ambulatory Visit: Payer: Self-pay

## 2021-07-22 MED ORDER — HYDROCHLOROTHIAZIDE 25 MG PO TABS: 25.0000 mg | ORAL_TABLET | Freq: Every day | ORAL | 2 refills | Status: DC

## 2021-07-23 ENCOUNTER — Other Ambulatory Visit: Payer: Self-pay | Admitting: Internal Medicine

## 2021-07-23 ENCOUNTER — Other Ambulatory Visit: Payer: Self-pay | Admitting: Obstetrics and Gynecology

## 2021-07-23 DIAGNOSIS — Z1231 Encounter for screening mammogram for malignant neoplasm of breast: Secondary | ICD-10-CM

## 2021-07-24 ENCOUNTER — Other Ambulatory Visit: Payer: Self-pay

## 2021-07-24 DIAGNOSIS — J309 Allergic rhinitis, unspecified: Secondary | ICD-10-CM

## 2021-07-24 DIAGNOSIS — Z794 Long term (current) use of insulin: Secondary | ICD-10-CM

## 2021-07-24 DIAGNOSIS — G8929 Other chronic pain: Secondary | ICD-10-CM

## 2021-07-24 MED ORDER — FLUTICASONE PROPIONATE 50 MCG/ACT NA SUSP: NASAL | 5 refills | Status: DC

## 2021-07-24 MED ORDER — METFORMIN HCL 1000 MG PO TABS: ORAL_TABLET | ORAL | 5 refills | Status: DC

## 2021-07-24 MED ORDER — HYDROCHLOROTHIAZIDE 25 MG PO TABS: 25.0000 mg | ORAL_TABLET | Freq: Every day | ORAL | 2 refills | Status: DC

## 2021-07-24 NOTE — Telephone Encounter (Signed)
cyclobenzaprine (FLEXERIL) ?metFORMIN (GLUCOPHAGE) ?hydrochlorothiazide (HYDRODIURIL) ?[id number] [place], [place]  ?[address] [place], [place] [address]  ?Phone:  [phone]  Fax:  [phone]  ?

## 2021-08-29 ENCOUNTER — Ambulatory Visit
Admission: RE | Admit: 2021-08-29 | Discharge: 2021-08-29 | Disposition: A | Discharge status: Home / Self Care | Payer: Medicare Other | Source: Ambulatory Visit | Attending: *Deleted | Admitting: *Deleted

## 2021-08-29 DIAGNOSIS — Z1231 Encounter for screening mammogram for malignant neoplasm of breast: Secondary | ICD-10-CM | POA: Diagnosis not present

## 2021-09-02 ENCOUNTER — Ambulatory Visit: Payer: Medicare Other

## 2021-09-06 DIAGNOSIS — Z20822 Contact with and (suspected) exposure to covid-19: Secondary | ICD-10-CM | POA: Diagnosis not present

## 2021-09-19 ENCOUNTER — Other Ambulatory Visit: Payer: Self-pay

## 2021-09-19 DIAGNOSIS — J309 Allergic rhinitis, unspecified: Secondary | ICD-10-CM

## 2021-09-19 DIAGNOSIS — G8929 Other chronic pain: Secondary | ICD-10-CM

## 2021-09-19 DIAGNOSIS — E1142 Type 2 diabetes mellitus with diabetic polyneuropathy: Secondary | ICD-10-CM

## 2021-09-19 MED ORDER — HYDROCHLOROTHIAZIDE 25 MG PO TABS: 25.0000 mg | ORAL_TABLET | Freq: Every day | ORAL | 3 refills | Status: DC

## 2021-09-19 MED ORDER — DULOXETINE HCL 20 MG PO CPEP: 60.0000 mg | ORAL_CAPSULE | Freq: Every day | ORAL | 5 refills | Status: DC

## 2021-09-19 MED ORDER — CYCLOBENZAPRINE HCL 5 MG PO TABS: 5.0000 mg | ORAL_TABLET | Freq: Every day | ORAL | 0 refills | Status: DC | PRN

## 2021-09-19 MED ORDER — FLUTICASONE PROPIONATE 50 MCG/ACT NA SUSP: NASAL | 5 refills | Status: DC

## 2021-09-21 ENCOUNTER — Encounter: Payer: Self-pay | Admitting: *Deleted

## 2021-10-14 ENCOUNTER — Ambulatory Visit (INDEPENDENT_AMBULATORY_CARE_PROVIDER_SITE_OTHER): Payer: Medicare Other

## 2021-10-14 VITALS — BP 133/63 | HR 75 | Temp 98.5°F | Ht 63.0 in | Wt 156.5 lb

## 2021-10-14 DIAGNOSIS — M5441 Lumbago with sciatica, right side: Secondary | ICD-10-CM | POA: Diagnosis not present

## 2021-10-14 DIAGNOSIS — I25118 Atherosclerotic heart disease of native coronary artery with other forms of angina pectoris: Secondary | ICD-10-CM

## 2021-10-14 DIAGNOSIS — F17210 Nicotine dependence, cigarettes, uncomplicated: Secondary | ICD-10-CM

## 2021-10-14 DIAGNOSIS — G8929 Other chronic pain: Secondary | ICD-10-CM | POA: Diagnosis not present

## 2021-10-14 DIAGNOSIS — I10 Essential (primary) hypertension: Secondary | ICD-10-CM

## 2021-10-14 DIAGNOSIS — Z7984 Long term (current) use of oral hypoglycemic drugs: Secondary | ICD-10-CM

## 2021-10-14 DIAGNOSIS — E1169 Type 2 diabetes mellitus with other specified complication: Secondary | ICD-10-CM

## 2021-10-14 LAB — POCT GLYCOSYLATED HEMOGLOBIN (HGB A1C): Hemoglobin A1C: 6.2 % — AB (ref 4.0–5.6)

## 2021-10-14 LAB — GLUCOSE, CAPILLARY: Glucose-Capillary: 203 mg/dL — ABNORMAL HIGH (ref 70–99)

## 2021-10-14 MED ORDER — METHOCARBAMOL 750 MG PO TABS: 750.0000 mg | ORAL_TABLET | Freq: Four times a day (QID) | ORAL | 1 refills | Status: DC

## 2021-10-14 MED ORDER — EMPAGLIFLOZIN 10 MG PO TABS: 10.0000 mg | ORAL_TABLET | Freq: Every day | ORAL | 1 refills | Status: DC

## 2021-10-14 NOTE — Patient Instructions (Addendum)
Thank you, Rose [name] for allowing [place] to provide your care today. Today we discussed:  Back pain: We have referred you to the orthopedic clinic to get a repeat steroid injection of your back. We have also sent in a referral to physical therapy as it is important to strengthen you back muscles and the muscles that help you walk to help reduce pain long term. Flexeryl has risks for falls. We have stopped this and added a medication that provides muscle relaxation but has less fall risk. It is called robaxin. We will also order another walker to see if insurance can work something out with you, although usually they only pay for DME every 5 years.  Daibetes: Your blood sugar is well controlled and your A1c was 6.2 within your goal. We will make sure your kidneys are not being damaged by your diabetes with a microalbumin urine test today. We will also add a medication called jardiance as it helps with diabetes and protects your heart and your kidneys.    I have ordered the following labs for you:  Lab Orders         Glucose, capillary         Microalbumin / Creatinine Urine Ratio         POC Hbg A1C        I have ordered the following medication/changed the following medications:   Stop the following medications: Medications Discontinued During This Encounter  Medication Reason   cyclobenzaprine (FLEXERIL) 5 MG tablet Discontinued by provider     Start the following medications: Meds ordered this encounter  Medications   empagliflozin (JARDIANCE) 10 MG TABS tablet    Sig: Take 1 tablet (10 mg total) by mouth daily before breakfast.    Dispense:  30 tablet    Refill:  1   methocarbamol (ROBAXIN-750) 750 MG tablet    Sig: Take 1 tablet (750 mg total) by mouth 4 (four) times daily.    Dispense:  120 tablet    Refill:  1     Follow up: 3 months    We look forward to seeing you next time. Please call our clinic at [phone] if you have any questions or concerns. The  best time to call is Monday-Friday from 9am-4pm, but there is someone available 24/7. If after hours or the weekend, call the main hospital number and ask for the Internal Medicine Resident On-Call. If you need medication refills, please notify your pharmacy one week in advance and they will send [place] a request.   Thank you for trusting me with your care. Wishing you the best!   Iona Coach, MD [facility]

## 2021-10-14 NOTE — Assessment & Plan Note (Signed)
Blood pressure slightly above goal in clinic at 133/63. Likely secondary to pain and walking to the clinic. Starting jardiance for T2DM and will likely get some BP benefit. Can consider doing an HCTZ-losartan combo pill and reducing HCTZ to 12.'5mg'$  in the future for renal protection. -Continue HCTZ 25 mg daily -Continue Carvedilol 6.'25mg'$  BID

## 2021-10-14 NOTE — Progress Notes (Deleted)
Chronic back pain- lumbar meolograpy 2020 evidence of mild grade 1 anterolisthesis of L4 on L5 when upright, and moderate to severe facet arthropathy at that level. So perhaps L4-L5 is the symptomatic level. And there is underlying chronic L5-S1 ankylosis. Mild lumbar disc bulging. Up to mild bilateral L2 through L4 neural foraminal stenosis. Was using cain, dme walker ordered. Steroid injection 2022. PT referal multiple times. -flexeryl  5 mg -Cymbalta 60 mg -methadone 126 daily  HTN --Continue HCTZ 25 mg daily -Continue current 6.25 mg twice a day  T2DM- A1C 6.01 July 2021. Complicated by neuropathy with foot ulcer history, proteinuria. UTD on eye and foot exam. --Continue metformin 1000 mg twice daily  HLD -atorvastatin 40 mg  CAD s/p stnet to LAS 2021: drug eluting -clopidogril 75 mg daily PAD s/p bilateral common iliac stent

## 2021-10-14 NOTE — Progress Notes (Signed)
Established Patient Office Visit  Subjective   Patient ID: Rose [name], female    DOB: 07/24/1955  Age: 66 y.o. MRN: [id number]  Chief Complaint  Patient presents with   Back Pain    Ms. Dombrosky is a 66 y/o female with a pmh outlined below here for follow up of T2DM and chronic back pain. See A/P for HPI.  Back Pain Associated symptoms include headaches. Pertinent negatives include no chest pain or weakness.      Review of Systems  HENT:  Negative for hearing loss and tinnitus.   Eyes:  Negative for blurred vision, double vision and photophobia.  Respiratory:  Negative for shortness of breath.   Cardiovascular:  Negative for chest pain, orthopnea, claudication, leg swelling and PND.  Gastrointestinal:  Negative for nausea and vomiting.  Musculoskeletal:  Positive for back pain.  Neurological:  Positive for headaches. Negative for dizziness, sensory change and weakness.  Endo/Heme/Allergies:  Does not bruise/bleed easily.      Objective:     BP 133/63 (BP Location: Right Arm, Patient Position: Sitting, Cuff Size: Small)   Pulse 75   Temp 98.5 F (36.9 C) (Oral)   Ht '5\' 3"'  (1.6 m)   Wt 156 lb 8 oz (71 kg)   SpO2 100%   BMI 27.72 kg/m    Physical Exam Constitutional:      Appearance: Normal appearance.  HENT:     Head: Normocephalic and atraumatic.     Comments: No deformities or ptp Cardiovascular:     Rate and Rhythm: Normal rate.     Pulses: Normal pulses.     Heart sounds: Normal heart sounds. No murmur heard. Pulmonary:     Effort: Pulmonary effort is normal. No respiratory distress.     Breath sounds: Normal breath sounds. No wheezing or rales.  Musculoskeletal:     Right lower leg: No edema.     Left lower leg: No edema.     Comments: 5/5 bilateral strength about the hip and knee joints. Mild paraspinal ptp of the lumbar spin, no bony ptp.  Neurological:     Mental Status: She is alert.     Gait: Gait abnormal.     Comments: CN 2-12  intact.      Results for orders placed or performed in visit on 10/14/21  Glucose, capillary  Result Value Ref Range   Glucose-Capillary 203 (H) 70 - 99 mg/dL  POC Hbg A1C  Result Value Ref Range   Hemoglobin A1C 6.2 (A) 4.0 - 5.6 %   HbA1c POC (<> result, manual entry)     HbA1c, POC (prediabetic range)     HbA1c, POC (controlled diabetic range)        The ASCVD Risk score (Arnett DK, et al., 2019) failed to calculate for the following reasons:   The valid total cholesterol range is 130 to 320 mg/dL    Assessment & Plan:   Problem List Items Addressed This Visit       Cardiovascular and Mediastinum   CAD s/p DES to LAD (12/2019)   Relevant Medications   empagliflozin (JARDIANCE) 10 MG TABS tablet     Endocrine   Type 2 diabetes mellitus (HCC) - Primary    A1c today of 6.2 improved from 6.3 three months ago. Last eGFR in April 2023 was 71. Will check microalbumin today given worsening renal function. Will start jardiance given evidence of ckd, history of CAD, and in the setting of X9BZ complicated by retinopathy  and neuropathy. -Continue metformin 1000 mg BID - Start Jardiance 10 mg daily -microalbumin      Relevant Medications   empagliflozin (JARDIANCE) 10 MG TABS tablet   Other Relevant Orders   POC Hbg A1C (Completed)   Microalbumin / Creatinine Urine Ratio     Other   Chronic back pain    Patient reports continued pain of her lumbar spine due to anterolisthesis, degeneration of the disc, and mild foraminal stenosis. She says that the steroid injection in 2021 provided at least 1 month of symptomatic relief. She has not heard back from physical therapy when referred in the past. She fell 6 months ago and hit her head with no LOC, vision changes, tinnitus/hearing changes, N/V and CN 2-12 intact today without deformity or pain to palpation. Although only takes flexeril occasionally and denies a link between the two, will stop flexeril today due to fall risk. She is  currently using a can and is hinged at the waist at a 60 degree angle with posterior imbalance when standing fully upright. She is not using her walker as it was not the rolator with a seat that she requested. Will order appropriate DME to help patient. -Stop flexeril -Start robaxin 750 mg four times daily prn -Referral to orthopedic clinic for joint injection -referral to PT - DME Rolator with seat ordered      Relevant Medications   methocarbamol (ROBAXIN-750) 750 MG tablet   Other Relevant Orders   Ambulatory referral to Physical Therapy   Ambulatory referral to Orthopedic Surgery   For home use only DME 4 wheeled rolling walker with seat    No follow-ups on file.    Iona Coach, MD

## 2021-10-14 NOTE — Addendum Note (Signed)
Addended by: Edwyna Perfect on: 10/14/2021 12:00 PM   Modules accepted: Orders

## 2021-10-14 NOTE — Assessment & Plan Note (Signed)
A1c today of 6.2 improved from 6.3 three months ago. Last eGFR in April 2023 was 103. Will check microalbumin today given worsening renal function. Will start jardiance given evidence of ckd, history of CAD, and in the setting of V7DY complicated by retinopathy and neuropathy. -Continue metformin 1000 mg BID - Start Jardiance 10 mg daily -microalbumin

## 2021-10-14 NOTE — Assessment & Plan Note (Addendum)
Patient reports continued pain of her lumbar spine due to anterolisthesis, degeneration of the disc, and mild foraminal stenosis. She says that the steroid injection in 2021 provided at least 1 month of symptomatic relief. She has not heard back from physical therapy when referred in the past. She fell 6 months ago and hit her head with no LOC, vision changes, tinnitus/hearing changes, N/V and CN 2-12 intact today without deformity or pain to palpation. Although only takes flexeril occasionally and denies a link between the two, will stop flexeril today due to fall risk. She is currently using a can and is hinged at the waist at a 60 degree angle with posterior imbalance when standing fully upright. She is not using her walker as it was not the rolator with a seat that she requested. Will order appropriate DME to help patient. -Stop flexeril -Start robaxin 750 mg four times daily prn -Referral to orthopedic clinic for joint injection -referral to PT - DME Rolator with seat ordered

## 2021-10-15 LAB — MICROALBUMIN / CREATININE URINE RATIO
Creatinine, Urine: 41.6 mg/dL
Microalb/Creat Ratio: 30 mg/g creat — ABNORMAL HIGH (ref 0–29)
Microalbumin, Urine: 12.4 ug/mL

## 2021-10-16 ENCOUNTER — Telehealth: Payer: Self-pay

## 2021-10-16 NOTE — Telephone Encounter (Signed)
Discussed with patient that her microalbumin was just out of normal range at 30 indicating borderline normal to moderate albuminuria, but improved from last time. Discussed that this is a marker of damage to the kidneys with diabetes and should stay stable or improve with glycemic control.

## 2021-10-17 NOTE — Progress Notes (Signed)
Internal Medicine Clinic Attending  I saw and evaluated the patient.  I personally confirmed the key portions of the history and exam documented by Dr. Rogers and I reviewed pertinent patient test results.  The assessment, diagnosis, and plan were formulated together and I agree with the documentation in the resident's note.  

## 2021-10-24 ENCOUNTER — Ambulatory Visit: Payer: Medicare Other | Admitting: Physical Medicine and Rehabilitation

## 2021-10-31 ENCOUNTER — Ambulatory Visit: Payer: Medicare Other | Admitting: Physical Medicine and Rehabilitation

## 2021-11-05 ENCOUNTER — Ambulatory Visit (INDEPENDENT_AMBULATORY_CARE_PROVIDER_SITE_OTHER): Payer: Medicare Other | Admitting: Physical Medicine and Rehabilitation

## 2021-11-05 ENCOUNTER — Encounter: Payer: Self-pay | Admitting: Physical Medicine and Rehabilitation

## 2021-11-05 VITALS — BP 125/74 | HR 86

## 2021-11-05 DIAGNOSIS — R269 Unspecified abnormalities of gait and mobility: Secondary | ICD-10-CM | POA: Diagnosis not present

## 2021-11-05 DIAGNOSIS — M47816 Spondylosis without myelopathy or radiculopathy, lumbar region: Secondary | ICD-10-CM | POA: Diagnosis not present

## 2021-11-05 DIAGNOSIS — G8929 Other chronic pain: Secondary | ICD-10-CM

## 2021-11-05 DIAGNOSIS — M545 Low back pain, unspecified: Secondary | ICD-10-CM | POA: Diagnosis not present

## 2021-11-05 NOTE — Progress Notes (Signed)
Rose [name] - 66 y.o. female MRN [id number]  Date of birth: 1955/08/10  Office Visit Note: Visit Date: 11/05/2021 PCP: Iona Coach, MD Referred by: Iona Coach, MD  Subjective: Chief Complaint  Patient presents with   Lower Back - Pain   Right Leg - Pain   Left Leg - Pain   HPI: Rose [name] is a 66 y.o. female who comes in today for evaluation of chronic, worsening and severe right sided lower back pain. Pain ongoing for several years and is exacerbated by standing, movement and activity. She describes her pain as a sore and aching sensation, currently rates as 7 out of 10. Some relief of pain with rest and use of over the counter medications. Patient is currently being managed by Dr. Terrilyn Saver at [facility], she continue to take Methadone daily. Patients CT lumbar myelogram from 2020 exhibits grade 1 anterolisthesis of L4 on L5 and moderate to severe facet arthropathy at this level. There is also mild to moderate facet and mild ligament flavum hypertrophy at L3-L4 and chronic L5-S1 ankylosis. No high grade spinal canal stenosis noted. Patient is unable to have MRI imaging due to interstim implant. Patient underwent right L3-L4, L4-L5 and L5-S1 radiofrequency ablation in our office in 2021 and reports greater than 80% relief of pain lasting over a year with this procedure. Patient states her pain has gradually returned and would like to repeat procedure if possible. Recent referral placed for formal physical therapy by her primary care provider Dr. Iona Coach. Patient currently using cane and walker to assist with ambulation and prevent falls. Patient denies focal weakness, numbness and tingling. Patient denies recent trauma or falls.    Review of Systems  Musculoskeletal:  Positive for back pain.  Neurological:  Negative for tingling, sensory change, focal weakness and weakness.  All other systems reviewed and are negative.  Otherwise per HPI.  Assessment &  Plan: Visit Diagnoses:    ICD-10-CM   1. Chronic right-sided low back pain without sciatica  M54.50 Ambulatory referral to Physical Medicine Rehab   G89.29     2. Spondylosis without myelopathy or radiculopathy, lumbar region  M47.816 Ambulatory referral to Physical Medicine Rehab    3. Facet arthropathy, lumbar  M47.816 Ambulatory referral to Physical Medicine Rehab    4. Abnormality of gait  R26.9        Plan: Findings:  Chronic, worsening and severe right sided axial back pain. No radicular symptoms. Patient continues to have severe lower back pain despite good conservative therapies such as rest and use of medications. Patients clinical presentation and exam are consistent with facet medicated pain. Next step is to repeat right L3-L4, L4-L5 and L5-S1 radiofrequency ablation under fluoroscopic guidance. If patient continues to get significant and sustained relief with radiofrequency ablation we can repeat this procedure yearly as needed. I did instruct patient to call [facility] to inquire about referral for physical therapy. No red flag symptoms noted upon exam today.     Meds & Orders: No orders of the defined types were placed in this encounter.   Orders Placed This Encounter  Procedures   Ambulatory referral to Physical Medicine Rehab    Follow-up: Return for Right L3-L4, L4-L5 and L5-S1 radiofrequency ablation.   Procedures: No procedures performed      Clinical History: EXAM: LUMBAR MYELOGRAM   Patient was positioned prone on the fluoroscopy table. Local anesthesia was provided with 1% lidocaine without epinephrine after prepped and draped  in the usual sterile fashion. Puncture was performed at L2-L3 using a 3 1/2 inch 22-gauge spinal needle via left sub laminar approach. Using a single pass through the dura, the needle was placed within the thecal sac, with return of clear CSF. 13 milliliters of Isovue M-200 was injected into the thecal  sac, with normal opacification of the nerve roots and cauda equina consistent with free flow within the subarachnoid space.   I personally performed the lumbar puncture and administered the intrathecal contrast. I also personally supervised acquisition of the myelogram images.   TECHNIQUE: Contiguous axial images were obtained through the Lumbar spine after the intrathecal infusion of infusion. Coronal and sagittal reconstructions were obtained of the axial image sets.   COMPARISON:  Lumbar MRI 12/08/2008. Thoracolumbar radiographs 10/09/2005.   FINDINGS: LUMBAR MYELOGRAM FINDINGS:   Chronic or congenital ankylosis of L5-S1 demonstrated on the prior studies. Otherwise normal lumbar segmentation, lowest ribs at T12.   Patent lumbar and lower thoracic thecal sac with good intrathecal contrast. Small defects on the ventral thecal sac L2-L3 through L4-L5. No lateral recess stenosis is evident. When prone mildly exaggerated lumbar lordosis resulted in decreased contrast at the L5-S1 level.   Lateral decubitus and upright lateral images were obtained. There is mild grade 1 anterolisthesis of L4 on L5 apparent when upright. Upright flexion and extension views were obtained but no abnormal motion identified. Calcified aortic atherosclerosis.   CT LUMBAR MYELOGRAM FINDINGS:   Negative visible costophrenic angle. Partially visible evidence of extensive cholelithiasis in a distended gallbladder (series 5, image 40), and layering in the more normal appearing gallbladder neck (series 4, image 33). Otherwise negative visible noncontrast abdominal viscera. Aortoiliac calcified atherosclerosis.   Partially visible right side sacral spinal stimulator device.   Stable vertebral height and alignment since 2010 with no L4-L5 spondylolisthesis evident on these images, but there is vacuum facet phenomena at that level, see additional details below. No acute osseous abnormality identified.  Intact visible sacrum and SI joints.   Normal myelographic appearance of the lower thoracic spinal cord with conus at L1-L2. No lower thoracic spinal stenosis. Cauda equina nerve roots appear normal.   T12-L1:  Mild facet hypertrophy.  No stenosis.   L1-L2:  Negative.   L2-L3: Mild circumferential disc bulge. Mild facet and ligament flavum hypertrophy. Mild left L2 neural foraminal stenosis.   L3-L4: Mild circumferential disc bulge. Mild to moderate facet and mild ligament flavum hypertrophy. No spinal or lateral recess stenosis. Mild L3 neural foraminal stenosis which appears greater on the left.   L4-L5: Mild circumferential disc bulge. Moderate to severe facet hypertrophy with vacuum facet, greater on the left. Only mild ligament flavum hypertrophy. No spinal or lateral recess stenosis. Mild bilateral L4 foraminal stenosis appears fairly symmetric.   L5-S1:  Chronically ankylosed.  No stenosis.   IMPRESSION: 1. No lumbar spinal stenosis or convincing right side neural impingement, but there is evidence of mild grade 1 anterolisthesis of L4 on L5 when upright, and moderate to severe facet arthropathy at that level. So perhaps L4-L5 is the symptomatic level. And there is underlying chronic L5-S1 ankylosis. Mild lumbar disc bulging. Up to mild bilateral L2 through L4 neural foraminal stenosis. 2. Cholelithiasis which may be severe and/or associated with porcelain gallbladder. Consider right upper quadrant ultrasound or CT abdomen. 3. Partially visible sacral stimulator device. 4.  Aortic Atherosclerosis (ICD10-I70.0).   On: 08/30/2018 11:11   She reports that she has been smoking cigarettes. She has a 10.00 pack-year smoking history.  She has never used smokeless tobacco.  Recent Labs    12/24/20 1521 07/01/21 1102 10/14/21 0952  HGBA1C 5.6 6.3* 6.2*    Objective:  VS:  HT:    WT:   BMI:     BP:125/74  HR:86bpm  TEMP: ( )  RESP:  Physical Exam Vitals and  nursing note reviewed.  HENT:     Head: Normocephalic and atraumatic.     Right Ear: External ear normal.     Left Ear: External ear normal.     Nose: Nose normal.     Mouth/Throat:     Mouth: Mucous membranes are moist.  Eyes:     Extraocular Movements: Extraocular movements intact.  Cardiovascular:     Rate and Rhythm: Normal rate.     Pulses: Normal pulses.  Pulmonary:     Effort: Pulmonary effort is normal.  Abdominal:     General: Abdomen is flat. There is no distension.  Musculoskeletal:        General: Tenderness present.     Cervical back: Normal range of motion.     Comments: Pt is slow to rise from seated position to standing. Concordant low back pain with facet loading, lumbar spine extension and rotation. Strong distal strength without clonus, no pain upon palpation of greater trochanters. Sensation intact bilaterally. Ambulates with cane/walker, gait slow and unsteady.   Skin:    General: Skin is warm and dry.     Capillary Refill: Capillary refill takes less than 2 seconds.  Neurological:     General: No focal deficit present.     Mental Status: She is alert and oriented to person, place, and time.  Psychiatric:        Mood and Affect: Mood normal.        Behavior: Behavior normal.     Ortho Exam  Imaging: No results found.  Past Medical/Family/Surgical/Social History: Medications & Allergies reviewed per EMR, new medications updated. Patient Active Problem List   Diagnosis Date Noted   Fall 07/01/2021   Iron deficiency anemia 11/15/2020   Hepatic steatosis    CAD s/p DES to LAD (12/2019) 01/17/2020   PAD s/p PCI with stent 08/18/2019   Allergic rhinitis 10/28/2018   Diabetic neuropathy ([place]) 06/02/2017   Hyperlipidemia associated with type 2 diabetes mellitus ([place]) 12/13/2015   Tobacco use disorder 12/13/2015   Chronic renal insufficiency 12/12/2015   Depression 12/12/2015   Methadone maintenance therapy patient ([place])    History of substance use  disorder    Chronic back pain    Preventative health care 12/14/2014   Type 2 diabetes mellitus ([place]) 02/27/2012   Essential hypertension 02/27/2012   Past Medical History:  Diagnosis Date   ACE inhibitor-aggravated angioedema, initial encounter 04/26/2016   Allergic rhinitis    Anxiety    Arthritis    Calculus of gallbladder without cholecystitis without obstruction 10/28/2018   Chronic back pain    CKD (chronic kidney disease), stage III (HCC)    folllowed by pcp   Coronary artery disease    Depression    Diabetic neuropathy ([place])    Edema of lower extremity 10/28/2018   Encounter for completion of form with patient 06/21/2019   Environmental allergies    Environmental allergies 12/28/2015   Fibromyalgia    Foot ulcer secondary to diabetes 06/21/2019   Frequency of urination    History of chronic bronchitis    History of drug abuse ([place]) states quit herion in 2006   History of hepatitis  C last abd ultrasound in epic 11-10-2018;  last hepative panel in epic 11-30-2018 was normal   previously followed by [facility] (notes in care everywhere)-- dx 04/ 2015,  started harvonic treatment 07/ 2015   History of rib fracture 10/28/2018   HPV in female 06/11/2016   Hypertension    followed by pcp  (nuclear stress test 01-15-2017 in epic,  showed low risk normal w/ nuclear ef 55%)   Laceration of right foot 03/04/2019   Lipoma of neck 07/16/2017   Seen by general surgery Dr Marlou Starks May 16 - plan for removal of 2cm sebaceous cyst.    Methadone maintenance therapy patient ([place])    Mild asthma    followed by pcp   Nocturia    PAD (peripheral artery disease) ([place])    followed by cardiology, dr Fletcher Anon---  06-01-2019  s/p bilateral CIA angioplasty stenting   Recurrent falls 04/30/2016   Right foot ulcer ([place])    Symptomatic anemia 11/15/2020   Tendonitis    bilateral wrist   Type 2 diabetes mellitus ([place])    followed by pcp   (06-16-2019  per pt checks blood sugar every other day  in am,  fasting sugar 92-95)   Urge urinary incontinence    Family History  Problem Relation Age of Onset   Hypertension Mother    Diabetes type II Mother    Heart attack Mother 28   CAD Father    Heart attack Father 2   Hypertension Brother    Diabetes type II Brother    Heart attack Brother 57   Diabetes type II Sister    Past Surgical History:  Procedure Laterality Date   ABDOMINAL AORTOGRAM W/LOWER EXTREMITY Bilateral 06/01/2019   Procedure: ABDOMINAL AORTOGRAM W/LOWER EXTREMITY;  Surgeon: Wellington Hampshire, MD;  Location: [facility];  Service: Cardiovascular;  Laterality: Bilateral;   APPENDECTOMY  1972   INTERSTIM IMPLANT PLACEMENT  10/23/2011   Procedure: Barrie Lyme IMPLANT FIRST STAGE;  Surgeon: Reece Packer, MD;  Location[facility];  Service: Urology;  Laterality: N/A;   INTERSTIM IMPLANT PLACEMENT  10/23/2011   Procedure: Barrie Lyme IMPLANT SECOND STAGE;  Surgeon: Reece Packer, MD;  Location: [facility];  Service: Urology;  Laterality: N/A;   LEFT HEART CATH AND CORONARY ANGIOGRAPHY N/A 01/11/2020   Procedure: LEFT HEART CATH AND CORONARY ANGIOGRAPHY;  Surgeon: Wellington Hampshire, MD;  Location: [place] [facility];  Service: Cardiovascular;  Laterality: N/A;   PANACOS TISSUE GRAFT Right 06/24/2019   Procedure: Application of Skin Graft Substitute;  Surgeon: Evelina Bucy, DPM;  Location: [place];  Service: Podiatry;  Laterality: Right;   PERIPHERAL VASCULAR INTERVENTION Bilateral 06/01/2019   Procedure: PERIPHERAL VASCULAR INTERVENTION;  Surgeon: Wellington Hampshire, MD;  Location: [place] [facility];  Service: Cardiovascular;  Laterality: Bilateral;   WOUND DEBRIDEMENT Right 06/24/2019   Procedure: DEBRIDEMENT WOUND;  Surgeon: Evelina Bucy, DPM;  Location: [place];  Service: Podiatry;  Laterality: Right;   WOUND DEBRIDEMENT Right 08/24/2019   Procedure: DEBRIDEMENT RIGHT FOOT WOUND;  APPLICATION OF SKIN GRAFT SUBSTITUTE;  Surgeon: Evelina Bucy, DPM;  Location: [place];  Service: Podiatry;  Laterality: Right;   Social History   Occupational History   Not on file  Tobacco Use   Smoking status: Some Days    Packs/day: 0.50    Years: 20.00    Total pack years: 10.00  Types: Cigarettes   Smokeless tobacco: Never   Tobacco comments:    SMOKES 4 CIGS PER DAY  Vaping Use   Vaping Use: Never used  Substance and Sexual Activity   Alcohol use: No    Alcohol/week: 0.0 standard drinks of alcohol   Drug use: Not Currently    Comment: hx heroin use, IV,  last used 2006 (currently on methadone)   Sexual activity: Not Currently

## 2021-11-05 NOTE — Progress Notes (Signed)
Pt state lower back pain that travels down both legs, mostly her right side. Pt state standing and walking for a long time to help ease her pain. Pt state she takes over the counter pain meds to help ease her pain.  Numeric Pain Rating Scale and Functional Assessment Average Pain 10 Pain Right Now 8 My pain is intermittent, constant, sharp, burning, dull, stabbing, tingling, and aching Pain is worse with: walking, standing, and some activites Pain improves with: heat/ice, medication, and injections   In the last MONTH (on 0-10 scale) has pain interfered with the following?  1. General activity like being  able to carry out your everyday physical activities such as walking, climbing stairs, carrying groceries, or moving a chair?  Rating(5)  2. Relation with others like being able to carry out your usual social activities and roles such as  activities at home, at work and in your community. Rating(6)  3. Enjoyment of life such that you have  been bothered by emotional problems such as feeling anxious, depressed or irritable?  Rating(7)

## 2021-11-14 ENCOUNTER — Telehealth: Payer: Self-pay

## 2021-11-14 NOTE — Telephone Encounter (Signed)
Pt requesting a call back.  Pt believes one of her medications is causing problems.  Pt states she took an EKG on  yesterday and her numbers were off the chart.

## 2021-11-14 NOTE — Telephone Encounter (Signed)
Pt requesting a call back. 

## 2021-11-14 NOTE — Telephone Encounter (Signed)
RTC to patient.  Will need to go to [facility]  sign a release of information.  Will do at appointment on tomorrow.  Patient gave the name of nurse here Einar Crow who will be willing when called would like to speak with Nurse  here. Call to [place].  Message left on [facility] vice mail to call the Clinics and ask to speak with the Triage Nurse.

## 2021-11-14 NOTE — Telephone Encounter (Signed)
Spoke to patient states she goes to [facility] for her Methadone.  Had Serum labs done a few days ago.  Was told results were off the chat.  Patent unsure of what levels were abnormal.. When asked if she has signed a release of information for the Clinics to get the levels-she stated she has been going there for awhile and has not signed a release of information.  Patient stated that the doctor told her that he would just repeat the labs in a couple of months and did not ask her to contact her PCP. Patient stated that the only new medication she is on is the [place].  Patient was told to see if she can get the labwork sent to the  Clinic doctor to review to see if something needs to be acted on.  Patient stated that she has an appointment with them on tomorrow and will sign a release of information.  Patient will also call to see if the labs can be sent over today so that her PCP can review act on if needed.  Patient to call to let us know what she has found out about getting the lab results to her PCP.

## 2021-11-18 ENCOUNTER — Telehealth: Payer: Self-pay | Admitting: Physical Medicine and Rehabilitation

## 2021-11-18 NOTE — Telephone Encounter (Signed)
Pt returned call to [facility] to set referral appt. Please call pt at [phone]

## 2021-11-22 ENCOUNTER — Ambulatory Visit: Payer: Self-pay

## 2021-11-22 NOTE — Patient Outreach (Signed)
  Care Coordination   11/22/2021 Name: Rose [name] MRN: [id number] DOB: 06/17/1955   Care Coordination Outreach Attempts:  An unsuccessful telephone outreach was attempted today to offer the patient information about available care coordination services as a benefit of their health plan.   Follow Up Plan:  Additional outreach attempts will be made to offer the patient care coordination information and services.   Encounter Outcome:  No Answer  Care Coordination Interventions Activated:  No   Care Coordination Interventions:  No, not indicated    [name], RN, BSN, CCM Care Management Coordinator [facility] Phone: [phone]: [phone]

## 2021-11-26 ENCOUNTER — Encounter: Payer: Self-pay | Admitting: Cardiovascular Disease

## 2021-11-26 ENCOUNTER — Ambulatory Visit: Payer: Medicare Other | Attending: Cardiovascular Disease | Admitting: Cardiovascular Disease

## 2021-11-26 VITALS — BP 118/54 | HR 89 | Ht 63.0 in | Wt 146.0 lb

## 2021-11-26 DIAGNOSIS — Z720 Tobacco use: Secondary | ICD-10-CM

## 2021-11-26 DIAGNOSIS — I2510 Atherosclerotic heart disease of native coronary artery without angina pectoris: Secondary | ICD-10-CM

## 2021-11-26 DIAGNOSIS — E785 Hyperlipidemia, unspecified: Secondary | ICD-10-CM

## 2021-11-26 DIAGNOSIS — I10 Essential (primary) hypertension: Secondary | ICD-10-CM

## 2021-11-26 DIAGNOSIS — I739 Peripheral vascular disease, unspecified: Secondary | ICD-10-CM | POA: Diagnosis not present

## 2021-11-26 NOTE — Progress Notes (Signed)
Cardiology Office Note   Date:  11/26/2021   ID:  Rozelle, Caudle 01/04/1956, MRN [id number]  PCP:  Iona Coach, MD  Cardiologist:   [name], MD   Chief Complaint  Patient presents with   Follow-up    6 months.   Edema    Feet.       History of Present Illness: Rose [name] is a 66 y.o. female who is here today for follow-up visit regarding peripheral arterial disease and coronary artery disease.  The patient has multiple chronic medical conditions that include type 2 diabetes, essential hypertension, hyperlipidemia, remote IV drug use and tobacco use. She is status post bilateral common iliac artery stenting done in March 2021.  She had no significant infrainguinal disease at that time.  She had an ulceration on the right lateral foot at that time.   She was seen in September of 2021 for exertional chest pain.  She underwent a Lexiscan Myoview which showed evidence of anterior and anterolateral ischemia.  Cardiac catheterization in October showed severe one-vessel coronary artery disease with 90% heavily calcified stenosis in the mid LAD and moderate left circumflex disease.  Ejection fraction was normal with mildly elevated left ventricular end-diastolic pressure.  I performed successful intravascular lithotripsy and drug eluting stent placement to the mid LAD.    She was able to quit smoking at some point but she relapsed in 2023-03-15 of last year after the death of her sister.  She has been doing reasonably well with no shortness of breath or palpitations.  No lower extremity claudication.  She reports to brief episodes of chest pain lasting 1 to 2 minutes weeks ago with no recurrent symptoms.  She thought it was due to indigestion.  No exertional symptoms.  Past Medical History:  Diagnosis Date   ACE inhibitor-aggravated angioedema, initial encounter 04/26/2016   Allergic rhinitis    Anxiety    Arthritis    Calculus of gallbladder without  cholecystitis without obstruction 10/28/2018   Chronic back pain    CKD (chronic kidney disease), stage III ([place])    folllowed by pcp   Coronary artery disease    Depression    Diabetic neuropathy ([place])    Edema of lower extremity 10/28/2018   Encounter for completion of form with patient 06/21/2019   Environmental allergies    Environmental allergies 12/28/2015   Fibromyalgia    Foot ulcer secondary to diabetes 06/21/2019   Frequency of urination    History of chronic bronchitis    History of drug abuse ([place]) states quit herion in 2006   History of hepatitis C last abd ultrasound in epic 11-10-2018;  last hepative panel in epic 11-30-2018 was normal   previously followed by [facility] (notes in care everywhere)-- dx 04/ 2015,  started harvonic treatment 07/ 2015   History of rib fracture 10/28/2018   HPV in female 06/11/2016   Hypertension    followed by pcp  (nuclear stress test 01-15-2017 in epic,  showed low risk normal w/ nuclear ef 55%)   Laceration of right foot 03/04/2019   Lipoma of neck 07/16/2017   Seen by general surgery Dr Marlou Starks May 16 - plan for removal of 2cm sebaceous cyst.    Methadone maintenance therapy patient ([place])    Mild asthma    followed by pcp   Nocturia    PAD (peripheral artery disease) ([place])    followed by cardiology, dr Fletcher Anon---  06-01-2019  s/p bilateral  CIA angioplasty stenting   Recurrent falls 04/30/2016   Right foot ulcer ([facility])    Symptomatic anemia 11/15/2020   Tendonitis    bilateral wrist   Type 2 diabetes mellitus ([place])    followed by pcp   (06-16-2019  per pt checks blood sugar every other day in am,  fasting sugar 92-95)   Urge urinary incontinence     Past Surgical History:  Procedure Laterality Date   ABDOMINAL AORTOGRAM W/LOWER EXTREMITY Bilateral 06/01/2019   Procedure: ABDOMINAL AORTOGRAM W/LOWER EXTREMITY;  Surgeon: Wellington Hampshire, MD;  Location: [place] [facility];  Service: Cardiovascular;  Laterality: Bilateral;    APPENDECTOMY  1972   INTERSTIM IMPLANT PLACEMENT  10/23/2011   Procedure: Barrie Lyme IMPLANT FIRST STAGE;  Surgeon: Reece Packer, MD;  Location[facility];  Service: Urology;  Laterality: N/A;   INTERSTIM IMPLANT PLACEMENT  10/23/2011   Procedure: Barrie Lyme IMPLANT SECOND STAGE;  Surgeon: Reece Packer, MD;  Location: [facility];  Service: Urology;  Laterality: N/A;   LEFT HEART CATH AND CORONARY ANGIOGRAPHY N/A 01/11/2020   Procedure: LEFT HEART CATH AND CORONARY ANGIOGRAPHY;  Surgeon: Wellington Hampshire, MD;  Location: [place] [facility];  Service: Cardiovascular;  Laterality: N/A;   PANACOS TISSUE GRAFT Right 06/24/2019   Procedure: Application of Skin Graft Substitute;  Surgeon: Evelina Bucy, DPM;  Location: [place];  Service: Podiatry;  Laterality: Right;   PERIPHERAL VASCULAR INTERVENTION Bilateral 06/01/2019   Procedure: PERIPHERAL VASCULAR INTERVENTION;  Surgeon: Wellington Hampshire, MD;  Location: [place] [facility];  Service: Cardiovascular;  Laterality: Bilateral;   WOUND DEBRIDEMENT Right 06/24/2019   Procedure: DEBRIDEMENT WOUND;  Surgeon: Evelina Bucy, DPM;  Location: [place];  Service: Podiatry;  Laterality: Right;   WOUND DEBRIDEMENT Right 08/24/2019   Procedure: DEBRIDEMENT RIGHT FOOT WOUND; APPLICATION OF SKIN GRAFT SUBSTITUTE;  Surgeon: Evelina Bucy, DPM;  Location: [place];  Service: Podiatry;  Laterality: Right;     Current Outpatient Medications  Medication Sig Dispense Refill   albuterol (VENTOLIN HFA) 108 (90 Base) MCG/ACT inhaler Inhale 2 puffs into the lungs every 6 (six) hours as needed. For wheezing 1 each 2   atorvastatin (LIPITOR) 40 MG tablet TAKE 1 TABLET(40 MG) BY MOUTH DAILY 90 tablet 6   atorvastatin (LIPITOR) 80 MG tablet Take 1 tablet (80 mg total) by mouth daily. 30 tablet 11   carvedilol (COREG) 6.25 MG tablet Take 1 tablet (6.25 mg total) by mouth 2  (two) times daily. 60 tablet 11   cetirizine (ZYRTEC) 10 MG tablet Take 1 tablet (10 mg total) by mouth daily. 30 tablet 11   clopidogrel (PLAVIX) 75 MG tablet Take 1 tablet (75 mg total) by mouth daily with breakfast. 90 tablet 3   diclofenac Sodium (VOLTAREN) 1 % GEL Apply 2 g topically 4 (four) times daily as needed (joint pain).     DULoxetine (CYMBALTA) 20 MG capsule Take 3 capsules (60 mg total) by mouth daily. 90 capsule 5   empagliflozin (JARDIANCE) 10 MG TABS tablet Take 1 tablet (10 mg total) by mouth daily before breakfast. 30 tablet 1   fluticasone (FLONASE) 50 MCG/ACT nasal spray One spray in each nostril daily 16 g 5   hydrochlorothiazide (HYDRODIURIL) 25 MG tablet Take 1 tablet (25 mg total) by mouth daily. 90 tablet 3   metFORMIN (GLUCOPHAGE) 1000 MG tablet TAKE 1 TABLET(1000 MG) BY MOUTH TWICE DAILY WITH A MEAL 60  tablet 5   methadone (DOLOPHINE) 10 MG/5ML solution Take 126 mg by mouth daily.      methocarbamol (ROBAXIN-750) 750 MG tablet Take 1 tablet (750 mg total) by mouth 4 (four) times daily. 120 tablet 1   Multiple Vitamins-Minerals (CENTRUM WOMEN) TABS Take 1 tablet by mouth daily.     nicotine (NICODERM CQ - DOSED IN MG/24 HOURS) 14 mg/24hr patch Medicaid [id number] L Wks 1-6: 14 mg x 1 patch daily. Wear for 24 hours. If you have sleep disturbances, remove at bedtime. (Patient taking differently: Place 14 mg onto the skin See admin instructions. Medicaid [id number] L Wks 1-6: 14 mg x 1 patch daily. Wear for 24 hours. If you have sleep disturbances, remove at bedtime.) 30 patch 2   No current facility-administered medications for this visit.    Allergies:   Lisinopril, Gabapentin, Vicodin [hydrocodone-acetaminophen], and Hydrocodone-acetaminophen    Social History:  The patient  reports that she has been smoking cigarettes. She has a 10.00 pack-year smoking history. She has never used smokeless tobacco. She reports that she does not currently use drugs. She reports that she  does not drink alcohol.   Family History:  The patient's family history includes CAD in her father; Diabetes type II in her brother, mother, and sister; Heart attack (age of onset: 69) in her brother; Heart attack (age of onset: 62) in her father and mother; Hypertension in her brother and mother.    ROS:  Please see the history of present illness.   Otherwise, review of systems are positive for none.   All other systems are reviewed and negative.    PHYSICAL EXAM: VS:  BP (!) 118/54 (BP Location: Right Arm, Patient Position: Sitting, Cuff Size: Normal)   Pulse 89   Ht '5\' 3"'$  (1.6 m)   Wt 146 lb (66.2 kg)   BMI 25.86 kg/m  , BMI Body mass index is 25.86 kg/m. GEN: Well nourished, well developed, in no acute distress  HEENT: normal  Neck: no JVD, carotid bruits, or masses Cardiac: RRR; no murmurs, rubs, or gallops, mild bilateral leg edema Respiratory:  clear to auscultation bilaterally, normal work of breathing GI: soft, nontender, nondistended, + BS MS: no deformity or atrophy  Skin: warm and dry, no rash Neuro:  Strength and sensation are intact Psych: euthymic mood, full affect Vascular: Posterior tibial pulses palpable bilaterally.   EKG:  EKG is not ordered today.  \  No acute changes.  Recent Labs: 12/26/2020: ALT 10; Hemoglobin 10.6; Platelets 234.0 07/01/2021: BUN 15; Creatinine, Ser 1.32; Potassium 3.7; Sodium 132    Lipid Panel    Component Value Date/Time   CHOL 83 (L) 11/30/2018 0912   TRIG 109 11/30/2018 0912   HDL 31 (L) 11/30/2018 0912   CHOLHDL 2.7 11/30/2018 0912   LDLCALC 32 11/30/2018 0912      Wt Readings from Last 3 Encounters:  11/26/21 146 lb (66.2 kg)  10/14/21 156 lb 8 oz (71 kg)  07/01/21 158 lb 11.2 oz (72 kg)           12/19/2016    1:37 PM  PAD Screen  Previous PAD dx? No  Previous surgical procedure? No  Pain with walking? No  Feet/toe relief with dangling? Yes  Painful, non-healing ulcers? No  Extremities discolored? No       ASSESSMENT AND PLAN:  1.  Coronary artery disease involving native coronary arteries without angina : She is doing well with no anginal symptoms.  Continue medical therapy.  She  is on long-term dual antiplatelet therapy due to her combined coronary and peripheral arterial disease.  2. Peripheral arterial disease: Status post  stent placement to bilateral common iliac arteries.  No significant infrainguinal disease.  I did quested follow-up ABI and duplex to be done in October  3.  Tobacco use: I again discussed with him the importance of smoking cessation.  4.  Hyperlipidemia: Continue treatment with atorvastatin with a target LDL of less than 70.  Most recent lipid profile in September of 2020 showed an LDL of 32.  I asked her to request lipid profile with her next labs done by her primary care physician.  5.  Essential hypertension: Blood pressure is controlled.    Disposition:   FU with me in 6 months  Signed,  [name], MD  11/26/2021 8:26 AM    [place]

## 2021-11-26 NOTE — Patient Instructions (Signed)
Medication Instructions:  The current medical regimen is effective;  continue present plan and medications.  *If you need a refill on your cardiac medications before your next appointment, please call your pharmacy*   Testing/Procedures: VAS US AORTA/IVC/ILIACS (due in October)   Your physician has requested that you have an ankle brachial index (ABI) (due in October). During this test an ultrasound and blood pressure cuff are used to evaluate the arteries that supply the arms and legs with blood. Allow thirty minutes for this exam. There are no restrictions or special instructions.   Follow-Up: At [facility], you and your health needs are our priority.  As part of our continuing mission to provide you with exceptional heart care, we have created designated Provider Care Teams.  These Care Teams include your primary Cardiologist (physician) and Advanced Practice Providers (APPs -  Physician Assistants and Nurse Practitioners) who all work together to provide you with the care you need, when you need it.  We recommend signing up for the patient portal called "MyChart".  Sign up information is provided on this After Visit Summary.  MyChart is used to connect with patients for Virtual Visits (Telemedicine).  Patients are able to view lab/test results, encounter notes, upcoming appointments, etc.  Non-urgent messages can be sent to your provider as well.   To learn more about what you can do with MyChart, go to [email or web address].    Your next appointment:   6 month(s)  The format for your next appointment:   In Person  Provider:   [name], MD

## 2021-12-10 ENCOUNTER — Ambulatory Visit (INDEPENDENT_AMBULATORY_CARE_PROVIDER_SITE_OTHER): Payer: Medicare Other | Admitting: Student

## 2021-12-10 ENCOUNTER — Encounter: Payer: Self-pay | Admitting: Student

## 2021-12-10 VITALS — BP 102/66 | HR 74 | Temp 98.4°F | Wt 146.4 lb

## 2021-12-10 DIAGNOSIS — E1151 Type 2 diabetes mellitus with diabetic peripheral angiopathy without gangrene: Secondary | ICD-10-CM | POA: Diagnosis not present

## 2021-12-10 DIAGNOSIS — Z1211 Encounter for screening for malignant neoplasm of colon: Secondary | ICD-10-CM

## 2021-12-10 DIAGNOSIS — Z23 Encounter for immunization: Secondary | ICD-10-CM | POA: Diagnosis not present

## 2021-12-10 DIAGNOSIS — E1169 Type 2 diabetes mellitus with other specified complication: Secondary | ICD-10-CM

## 2021-12-10 DIAGNOSIS — J309 Allergic rhinitis, unspecified: Secondary | ICD-10-CM | POA: Diagnosis not present

## 2021-12-10 DIAGNOSIS — K5903 Drug induced constipation: Secondary | ICD-10-CM | POA: Diagnosis not present

## 2021-12-10 DIAGNOSIS — I739 Peripheral vascular disease, unspecified: Secondary | ICD-10-CM

## 2021-12-10 DIAGNOSIS — G44209 Tension-type headache, unspecified, not intractable: Secondary | ICD-10-CM | POA: Insufficient documentation

## 2021-12-10 DIAGNOSIS — T402X5A Adverse effect of other opioids, initial encounter: Secondary | ICD-10-CM | POA: Diagnosis not present

## 2021-12-10 DIAGNOSIS — I10 Essential (primary) hypertension: Secondary | ICD-10-CM

## 2021-12-10 MED ORDER — EMPAGLIFLOZIN-METFORMIN HCL ER 10-1000 MG PO TB24: 1.0000 | ORAL_TABLET | Freq: Every day | ORAL | 2 refills | Status: DC

## 2021-12-10 MED ORDER — CLOPIDOGREL BISULFATE 75 MG PO TABS: 75.0000 mg | ORAL_TABLET | Freq: Every day | ORAL | 3 refills | Status: DC

## 2021-12-10 MED ORDER — SENNA 8.6 MG PO CAPS: 2.0000 | ORAL_CAPSULE | Freq: Every day | ORAL | 2 refills | Status: DC

## 2021-12-10 MED ORDER — FLUTICASONE PROPIONATE 50 MCG/ACT NA SUSP: NASAL | 5 refills | Status: DC

## 2021-12-10 NOTE — Patient Instructions (Signed)
Thank you, [name] for allowing [place] to provide your care today. Today we discussed diabetes and headache.  Please start taking the Jardiance-metformin combination tablet, 1 tablet daily.  Please start taking senna laxative, 2 tablets daily.    I have ordered the following labs for you:   Lab Orders         Lipid Profile         BMP8+Anion Gap      Referrals ordered today:    Referral Orders         Ambulatory referral to Podiatry      I have ordered the following medication/changed the following medications:   Stop the following medications: Medications Discontinued During This Encounter  Medication Reason   metFORMIN (GLUCOPHAGE) 1000 MG tablet    empagliflozin (JARDIANCE) 10 MG TABS tablet      Start the following medications: Meds ordered this encounter  Medications   Empagliflozin-metFORMIN HCl ER 12-998 MG TB24    Sig: Take 1 tablet by mouth daily.    Dispense:  60 tablet    Refill:  2   Sennosides (SENNA) 8.6 MG CAPS    Sig: Take 2 tablets by mouth daily.    Dispense:  60 capsule    Refill:  2     Follow up: 3 months   We look forward to seeing you next time. Please call our clinic at [phone] if you have any questions or concerns. The best time to call is Monday-Friday from 9am-4pm, but there is someone available 24/7. If after hours or the weekend, call the main hospital number and ask for the Internal Medicine Resident On-Call. If you need medication refills, please notify your pharmacy one week in advance and they will send [place] a request.   Thank you for trusting me with your care. Wishing you the best!   Nani Gasser, MD [place]

## 2021-12-10 NOTE — Assessment & Plan Note (Addendum)
Doing well from the patient's perspective.  At her last visit in July empagliflozin was started due to evidence of decreased renal function and an elevated microalbumin to creatinine ratio.  Tolerating the addition well without side effects.  Her chief complaint is of the added pill burden that her metformin contributes to her daily medication regimen.  She has been on 1000 mg of metformin twice daily for several years.  She denies medication side effects, including GI upset.  Her diabetes is currently well controlled with a hemoglobin A1c of 6.2.  This patient has a history of diabetes with EGFR of 45 and microalbumin/creatinine ratio of 30.  Hemoglobin A1c reflects great control of her diabetes on her current regimen.  Because of her complaints regarding her pill burden I will combine metformin and empagliflozin.  We will need a repeat hemoglobin A1c at her next visit. - Start empagliflozin-metformin HCl ER 12-998 mg daily. - Discontinue metformin 1000 mg twice daily - Discontinue empagliflozin 10 mg daily - Repeat hemoglobin A1c at next visit

## 2021-12-10 NOTE — Progress Notes (Signed)
Subjective:  Reason for visit: Diabetes follow-up.  Headache.  HPI:  Rose [name] is a 66 y.o. female with a past medical history stated below and presents today for diabetes follow-up and headache. Please see problem based assessment and plan for additional details.  Past Medical History:  Diagnosis Date   ACE inhibitor-aggravated angioedema, initial encounter 04/26/2016   Allergic rhinitis    Anxiety    Arthritis    Calculus of gallbladder without cholecystitis without obstruction 10/28/2018   Chronic back pain    CKD (chronic kidney disease), stage III ([place])    folllowed by pcp   Coronary artery disease    Depression    Diabetic neuropathy ([place])    Edema of lower extremity 10/28/2018   Encounter for completion of form with patient 06/21/2019   Environmental allergies    Environmental allergies 12/28/2015   Fibromyalgia    Foot ulcer secondary to diabetes 06/21/2019   Frequency of urination    History of chronic bronchitis    History of drug abuse ([place]) states quit herion in 2006   History of hepatitis C last abd ultrasound in epic 11-10-2018;  last hepative panel in epic 11-30-2018 was normal   previously followed by [facility] (notes in care everywhere)-- dx 04/ 2015,  started harvonic treatment 07/ 2015   History of rib fracture 10/28/2018   HPV in female 06/11/2016   Hypertension    followed by pcp  (nuclear stress test 01-15-2017 in epic,  showed low risk normal w/ nuclear ef 55%)   Laceration of right foot 03/04/2019   Lipoma of neck 07/16/2017   Seen by general surgery Dr Marlou Starks May 16 - plan for removal of 2cm sebaceous cyst.    Methadone maintenance therapy patient ([place])    Mild asthma    followed by pcp   Nocturia    PAD (peripheral artery disease) ([place])    followed by cardiology, dr Fletcher Anon---  06-01-2019  s/p bilateral CIA angioplasty stenting   Recurrent falls 04/30/2016   Right foot ulcer ([place])    Symptomatic anemia 11/15/2020    Tendonitis    bilateral wrist   Type 2 diabetes mellitus ([name])    followed by pcp   (06-16-2019  per pt checks blood sugar every other day in am,  fasting sugar 92-95)   Urge urinary incontinence     Current Outpatient Medications on File Prior to Visit  Medication Sig Dispense Refill   albuterol (VENTOLIN HFA) 108 (90 Base) MCG/ACT inhaler Inhale 2 puffs into the lungs every 6 (six) hours as needed. For wheezing 1 each 2   atorvastatin (LIPITOR) 40 MG tablet TAKE 1 TABLET(40 MG) BY MOUTH DAILY 90 tablet 6   atorvastatin (LIPITOR) 80 MG tablet Take 1 tablet (80 mg total) by mouth daily. 30 tablet 11   carvedilol (COREG) 6.25 MG tablet Take 1 tablet (6.25 mg total) by mouth 2 (two) times daily. 60 tablet 11   cetirizine (ZYRTEC) 10 MG tablet Take 1 tablet (10 mg total) by mouth daily. 30 tablet 11   DULoxetine (CYMBALTA) 20 MG capsule Take 3 capsules (60 mg total) by mouth daily. 90 capsule 5   hydrochlorothiazide (HYDRODIURIL) 25 MG tablet Take 1 tablet (25 mg total) by mouth daily. 90 tablet 3   methadone (DOLOPHINE) 10 MG/5ML solution Take 126 mg by mouth daily.      Multiple Vitamins-Minerals (CENTRUM WOMEN) TABS Take 1 tablet by mouth daily.     nicotine (  NICODERM CQ - DOSED IN MG/24 HOURS) 14 mg/24hr patch Medicaid [id number] L Wks 1-6: 14 mg x 1 patch daily. Wear for 24 hours. If you have sleep disturbances, remove at bedtime. (Patient taking differently: Place 14 mg onto the skin See admin instructions. Medicaid [id number] L Wks 1-6: 14 mg x 1 patch daily. Wear for 24 hours. If you have sleep disturbances, remove at bedtime.) 30 patch 2   No current facility-administered medications on file prior to visit.    Family History  Problem Relation Age of Onset   Hypertension Mother    Diabetes type II Mother    Heart attack Mother 3   CAD Father    Heart attack Father 64   Hypertension Brother    Diabetes type II Brother    Heart attack Brother 52   Diabetes type II Sister      Social History   Socioeconomic History   Marital status: Single    Spouse name: Not on file   Number of children: Not on file   Years of education: Not on file   Highest education level: Not on file  Occupational History   Not on file  Tobacco Use   Smoking status: Some Days    Packs/day: 0.50    Years: 20.00    Total pack years: 10.00    Types: Cigarettes   Smokeless tobacco: Never   Tobacco comments:    SMOKES 4 CIGS PER DAY  Vaping Use   Vaping Use: Never used  Substance and Sexual Activity   Alcohol use: No    Alcohol/week: 0.0 standard drinks of alcohol   Drug use: Not Currently    Comment: hx heroin use, IV,  last used 2006 (currently on methadone)   Sexual activity: Not Currently  Other Topics Concern   Not on file  Social History Narrative   Not on file   Social Determinants of Health   Financial Resource Strain: Low Risk  (07/15/2021)   Overall Financial Resource Strain (CARDIA)    Difficulty of Paying Living Expenses: Not hard at all  Food Insecurity: No Food Insecurity (07/15/2021)   Hunger Vital Sign    Worried About Running Out of Food in the Last Year: Never true    [place] in the Last Year: Never true  Transportation Needs: Unmet Transportation Needs (07/15/2021)   PRAPARE - Transportation    Lack of Transportation (Medical): Yes    Lack of Transportation (Non-Medical): Yes  Physical Activity: Insufficiently Active (07/15/2021)   Exercise Vital Sign    Days of Exercise per Week: 2 days    Minutes of Exercise per Session: 40 min  Stress: No Stress Concern Present (07/15/2021)   [place]    Feeling of Stress : Not at all  Social Connections: Moderately Integrated (07/15/2021)   Social Connection and Isolation Panel [NHANES]    Frequency of Communication with Friends and Family: More than three times a week    Frequency of Social Gatherings with Friends and Family: Never     Attends Religious Services: More than 4 times per year    Active Member of [employer] or Organizations: Yes    Attends Archivist Meetings: Never    Marital Status: Never married  Intimate Partner Violence: Not At Risk (07/15/2021)   Humiliation, Afraid, Rape, and Kick questionnaire    Fear of Current or Ex-Partner: No    Emotionally Abused: No    Physically Abused: No  Sexually Abused: No    Review of Systems: ROS negative except for what is noted on the assessment and plan.  Objective:   Vitals:   12/10/21 0909  BP: 102/66  Pulse: 74  Temp: 98.4 F (36.9 C)  TempSrc: Oral  SpO2: 97%  Weight: 146 lb 6.4 oz (66.4 kg)    Physical Exam: General: Well-appearing female. HEENT: No cervical lymphadenopathy.  Tenderness to paraspinal muscles of cervical spine. Cardiovascular: Regular rate and rhythm.  Radial pulse 2+ bilaterally. Pulmonary: Normal work of breathing.  Lungs clear to auscultation. Skin: Warm and dry. Neurologic: Alert and oriented.  No focal deficits noted. Psychiatric: Pleasant.  Appropriate mood and affect.   Assessment & Plan:  Type 2 diabetes mellitus ([place]) Doing well from the patient's perspective.  At her last visit in July empagliflozin was started due to evidence of decreased renal function and an elevated microalbumin to creatinine ratio.  Tolerating the addition well without side effects.  Her chief complaint is of the added pill burden that her metformin contributes to her daily medication regimen.  She has been on 1000 mg of metformin twice daily for several years.  She denies medication side effects, including GI upset.  Her diabetes is currently well controlled with a hemoglobin A1c of 6.2.  This patient has a history of diabetes with EGFR of 45 and microalbumin/creatinine ratio of 30.  Hemoglobin A1c reflects great control of her diabetes on her current regimen.  Because of her complaints regarding her pill burden I will combine metformin and  empagliflozin.  We will need a repeat hemoglobin A1c at her next visit. - Start empagliflozin-metformin HCl ER 12-998 mg daily. - Discontinue metformin 1000 mg twice daily - Discontinue empagliflozin 10 mg daily - Repeat hemoglobin A1c at next visit  Tension headache Patient complains of intermittent headache of 1 month duration.  Located in the occipital region.  Feels like a throbbing.  Does not radiate.  Seems to be triggered by times of stress.  Respond well to Aleve.  Not associated with fevers, neck stiffness, visual disturbances, numbness, tingling.  Exam is notable for some tenderness of the cervical paraspinal muscles and absence of focal neurologic deficits.  66 year old patient with 1 month history of intermittent occipital headaches triggered by stress and relieved with NSAIDs.  Syndrome is most consistent with tension type headaches.  History and physical exam are reassuring.  No red flag symptoms to suggest a more threatening organic cause like intracranial mass, increased ICP.  At this time I will manage conservatively.  No indication for imaging.  I recommend abortive NSAIDs.  Therapeutic opioid-induced constipation (OIC) Patient complains of mild, intermittent constipation.  She reports constipation approximately 3 weeks out of the month.  Denies nausea, vomiting, fevers.  No abdominal pain.  Takes MiraLAX and docusate at home.  Mild constipation due to methadone therapy.  Patient has never tried a stimulant laxative before.  We will add senna to her regimen of stool softeners and bulking agents. - Start daily senna.   Patient seen with Dr. Archie Endo, M.D. [facility]  PGY-1 Pager: [phone] Date 12/10/2021  Time 3:14 PM

## 2021-12-10 NOTE — Progress Notes (Signed)
Internal Medicine Clinic Attending  I saw and evaluated the patient.  I personally confirmed the key portions of the history and exam documented by the resident  and I reviewed pertinent patient test results.  The assessment, diagnosis, and plan were formulated together and I agree with the documentation in the resident's note.  

## 2021-12-10 NOTE — Assessment & Plan Note (Signed)
Patient complains of intermittent headache of 1 month duration.  Located in the occipital region.  Feels like a throbbing.  Does not radiate.  Seems to be triggered by times of stress.  Respond well to Aleve.  Not associated with fevers, neck stiffness, visual disturbances, numbness, tingling.  Exam is notable for some tenderness of the cervical paraspinal muscles and absence of focal neurologic deficits.  66 year old patient with 1 month history of intermittent occipital headaches triggered by stress and relieved with NSAIDs.  Syndrome is most consistent with tension type headaches.  History and physical exam are reassuring.  No red flag symptoms to suggest a more threatening organic cause like intracranial mass, increased ICP.  At this time I will manage conservatively.  No indication for imaging.  I recommend abortive NSAIDs.

## 2021-12-10 NOTE — Assessment & Plan Note (Signed)
Patient complains of mild, intermittent constipation.  She reports constipation approximately 3 weeks out of the month.  Denies nausea, vomiting, fevers.  No abdominal pain.  Takes MiraLAX and docusate at home.  Mild constipation due to methadone therapy.  Patient has never tried a stimulant laxative before.  We will add senna to her regimen of stool softeners and bulking agents. - Start daily senna.

## 2021-12-11 ENCOUNTER — Ambulatory Visit: Payer: Self-pay

## 2021-12-11 ENCOUNTER — Ambulatory Visit (INDEPENDENT_AMBULATORY_CARE_PROVIDER_SITE_OTHER): Payer: Medicare Other | Admitting: Physical Medicine and Rehabilitation

## 2021-12-11 ENCOUNTER — Encounter: Payer: Self-pay | Admitting: Physical Medicine and Rehabilitation

## 2021-12-11 VITALS — BP 94/54 | Ht 63.0 in | Wt 146.0 lb

## 2021-12-11 DIAGNOSIS — M47816 Spondylosis without myelopathy or radiculopathy, lumbar region: Secondary | ICD-10-CM

## 2021-12-11 LAB — LIPID PANEL
Chol/HDL Ratio: 3.4 ratio (ref 0.0–4.4)
Cholesterol, Total: 98 mg/dL — ABNORMAL LOW (ref 100–199)
HDL: 29 mg/dL — ABNORMAL LOW (ref >39)
LDL Chol Calc (NIH): 42 mg/dL (ref 0–99)
Triglycerides: 160 mg/dL — ABNORMAL HIGH (ref 0–149)
VLDL Cholesterol Cal: 27 mg/dL (ref 5–40)

## 2021-12-11 LAB — BMP8+ANION GAP
Anion Gap: 16 mmol/L (ref 10.0–18.0)
BUN/Creatinine Ratio: 13 (ref 12–28)
BUN: 20 mg/dL (ref 8–27)
CO2: 27 mmol/L (ref 20–29)
Calcium: 9.6 mg/dL (ref 8.7–10.3)
Chloride: 94 mmol/L — ABNORMAL LOW (ref 96–106)
Creatinine, Ser: 1.53 mg/dL — ABNORMAL HIGH (ref 0.57–1.00)
Glucose: 122 mg/dL — ABNORMAL HIGH (ref 70–99)
Potassium: 3.9 mmol/L (ref 3.5–5.2)
Sodium: 137 mmol/L (ref 134–144)
eGFR: 38 mL/min/{1.73_m2} — ABNORMAL LOW (ref >59)

## 2021-12-11 MED ORDER — METHYLPREDNISOLONE ACETATE 80 MG/ML IJ SUSP
40.0000 mg | Freq: Once | INTRAMUSCULAR | Status: AC
  Administered 2021-12-11: 40 mg

## 2021-12-11 NOTE — Progress Notes (Signed)
Patient is here with low back pain. She states that the pain moves from time to time, but is usually on the right side. She does have pain that radiates down the right leg to above the knee. Every now and then, the pain will shoot down to the foot. She does have numbness and tingling on the right. She ambulates with a cane. She is taking aleve or aspirin every once in a while.   Numeric Pain Rating Scale and Functional Assessment Average Pain 8.5   In the last MONTH (on 0-10 scale) has pain interfered with the following?  1. General activity like being  able to carry out your everyday physical activities such as walking, climbing stairs, carrying groceries, or moving a chair?  Rating(10)   +Driver, -BT, -Dye Allergies.

## 2021-12-11 NOTE — Patient Instructions (Signed)

## 2021-12-11 NOTE — Progress Notes (Signed)
Rose [name] - 66 y.o. female MRN [id number]  Date of birth: March 09, 1956  Office Visit Note: Visit Date: 12/11/2021 PCP: Iona Coach, MD Referred by: Iona Coach, MD  Subjective: Chief Complaint  Patient presents with   Lower Back - Pain   HPI:  Rose [name] is a 66 y.o. female who comes in todayfor planned repeat radiofrequency ablation of the Right L3-4, L4-5, L5-S1, and L4-L5  Lumbar facet joints. This would be ablation of the corresponding medial branches and/or dorsal rami.  Patient has had double diagnostic blocks with more than 70% relief.  Subsequent ablation gave them more than 6 months of over 60% relief.  They have had chronic back pain for quite some time, more than 3 months, which has been an ongoing situation with recalcitrant axial back pain.  They have no radicular pain.  Their axial pain is worse with standing and ambulating and on exam today with facet loading.  They have had physical therapy as well as home exercise program.  The imaging noted in the chart below indicated facet pathology. Accordingly they meet all the criteria and qualification for for radiofrequency ablation and we are going to complete this today hopefully for more longer term relief as part of comprehensive management program.   ROS Otherwise per HPI.  Assessment & Plan: Visit Diagnoses:    ICD-10-CM   1. Spondylosis without myelopathy or radiculopathy, lumbar region  M47.816 XR C-ARM NO REPORT    Radiofrequency,Lumbar    methylPREDNISolone acetate (DEPO-MEDROL) injection 40 mg      Plan: No additional findings.   Meds & Orders:  Meds ordered this encounter  Medications   methylPREDNISolone acetate (DEPO-MEDROL) injection 40 mg    Orders Placed This Encounter  Procedures   Radiofrequency,Lumbar   XR C-ARM NO REPORT    Follow-up: Return if symptoms worsen or fail to improve.   Procedures: No procedures performed  Lumbar Facet Joint Nerve Denervation  Patient:  Rose [name]      Date of Birth: [birth year] MRN: [id number] PCP: Iona Coach, MD      Visit Date: 12/11/2021   Universal Protocol:    Date/Time: 09/13/239:19 PM  Consent Given By: the patient  Position: PRONE  Additional Comments: Vital signs were monitored before and after the procedure. Patient was prepped and draped in the usual sterile fashion. The correct patient, procedure, and site was verified.   Injection Procedure Details:   Procedure diagnoses:  1. Spondylosis without myelopathy or radiculopathy, lumbar region      Meds Administered:  Meds ordered this encounter  Medications   methylPREDNISolone acetate (DEPO-MEDROL) injection 40 mg     Laterality: Right  Location/Site:  L3-L4, L2 and L3 medial branches, L4-L5, L3 and L4 medial branches, and L5-S1, L4 medial branch and L5 dorsal ramus  Needle: 18 ga.,  54m active tip, 1056mRF Cannula  Needle Placement: Along juncture of superior articular process and transverse pocess  Findings:  -Comments:  Procedure Details: For each desired target nerve, the corresponding transverse process (sacral ala for the L5 dorsal rami) was identified and the fluoroscope was positioned to square off the endplates of the corresponding vertebral body to achieve a true AP midline view.  The beam was then obliqued 15 to 20 degrees and caudally tilted 15 to 20 degrees to line up a trajectory along the target nerves. The skin over the target of the junction of superior articulating process and transverse process (sacral ala for the L5 dorsal rami)  was infiltrated with 41m of 1% Lidocaine without Epinephrine.  The 18 gauge 147mactive tip outer cannula was advanced in trajectory view to the target.  This procedure was repeated for each target nerve.  Then, for all levels, the outer cannula placement was fine-tuned and the position was then confirmed with bi-planar imaging.    Test stimulation was done both at sensory and motor  levels to ensure there was no radicular stimulation. The target tissues were then infiltrated with 1 ml of 1% Lidocaine without Epinephrine. Subsequently, a percutaneous neurotomy was carried out for 90 seconds at 80 degrees Celsius.  After the completion of the lesion, 1 ml of injectate was delivered. It was then repeated for each facet joint nerve mentioned above. Appropriate radiographs were obtained to verify the probe placement during the neurotomy.   Additional Comments:  The patient tolerated the procedure well Dressing: 2 x 2 sterile gauze and Band-Aid    Post-procedure details: Patient was observed during the procedure. Post-procedure instructions were reviewed.  Patient left the clinic in stable condition.      Clinical History: EXAM: LUMBAR MYELOGRAM   Patient was positioned prone on the fluoroscopy table. Local anesthesia was provided with 1% lidocaine without epinephrine after prepped and draped in the usual sterile fashion. Puncture was performed at L2-L3 using a 3 1/2 inch 22-gauge spinal needle via left sub laminar approach. Using a single pass through the dura, the needle was placed within the thecal sac, with return of clear CSF. 13 milliliters of Isovue M-200 was injected into the thecal sac, with normal opacification of the nerve roots and cauda equina consistent with free flow within the subarachnoid space.   I personally performed the lumbar puncture and administered the intrathecal contrast. I also personally supervised acquisition of the myelogram images.   TECHNIQUE: Contiguous axial images were obtained through the Lumbar spine after the intrathecal infusion of infusion. Coronal and sagittal reconstructions were obtained of the axial image sets.   COMPARISON:  Lumbar MRI 12/08/2008. Thoracolumbar radiographs 10/09/2005.   FINDINGS: LUMBAR MYELOGRAM FINDINGS:   Chronic or congenital ankylosis of L5-S1 demonstrated on the prior studies. Otherwise  normal lumbar segmentation, lowest ribs at T12.   Patent lumbar and lower thoracic thecal sac with good intrathecal contrast. Small defects on the ventral thecal sac L2-L3 through L4-L5. No lateral recess stenosis is evident. When prone mildly exaggerated lumbar lordosis resulted in decreased contrast at the L5-S1 level.   Lateral decubitus and upright lateral images were obtained. There is mild grade 1 anterolisthesis of L4 on L5 apparent when upright. Upright flexion and extension views were obtained but no abnormal motion identified. Calcified aortic atherosclerosis.   CT LUMBAR MYELOGRAM FINDINGS:   Negative visible costophrenic angle. Partially visible evidence of extensive cholelithiasis in a distended gallbladder (series 5, image 40), and layering in the more normal appearing gallbladder neck (series 4, image 33). Otherwise negative visible noncontrast abdominal viscera. Aortoiliac calcified atherosclerosis.   Partially visible right side sacral spinal stimulator device.   Stable vertebral height and alignment since 2010 with no L4-L5 spondylolisthesis evident on these images, but there is vacuum facet phenomena at that level, see additional details below. No acute osseous abnormality identified. Intact visible sacrum and SI joints.   Normal myelographic appearance of the lower thoracic spinal cord with conus at L1-L2. No lower thoracic spinal stenosis. Cauda equina nerve roots appear normal.   T12-L1:  Mild facet hypertrophy.  No stenosis.   L1-L2:  Negative.   L2-L3:  Mild circumferential disc bulge. Mild facet and ligament flavum hypertrophy. Mild left L2 neural foraminal stenosis.   L3-L4: Mild circumferential disc bulge. Mild to moderate facet and mild ligament flavum hypertrophy. No spinal or lateral recess stenosis. Mild L3 neural foraminal stenosis which appears greater on the left.   L4-L5: Mild circumferential disc bulge. Moderate to severe  facet hypertrophy with vacuum facet, greater on the left. Only mild ligament flavum hypertrophy. No spinal or lateral recess stenosis. Mild bilateral L4 foraminal stenosis appears fairly symmetric.   L5-S1:  Chronically ankylosed.  No stenosis.   IMPRESSION: 1. No lumbar spinal stenosis or convincing right side neural impingement, but there is evidence of mild grade 1 anterolisthesis of L4 on L5 when upright, and moderate to severe facet arthropathy at that level. So perhaps L4-L5 is the symptomatic level. And there is underlying chronic L5-S1 ankylosis. Mild lumbar disc bulging. Up to mild bilateral L2 through L4 neural foraminal stenosis. 2. Cholelithiasis which may be severe and/or associated with porcelain gallbladder. Consider right upper quadrant ultrasound or CT abdomen. 3. Partially visible sacral stimulator device. 4.  Aortic Atherosclerosis (ICD10-I70.0).   On: 08/30/2018 11:11     Objective:  VS:  HT:'5\' 3"'$  (160 cm)   WT:146 lb (66.2 kg)  BMI:25.87    BP:(!) 94/54  HR: bpm  TEMP: ( )  RESP:  Physical Exam Vitals and nursing note reviewed.  Constitutional:      General: She is not in acute distress.    Appearance: Normal appearance. She is not ill-appearing.  HENT:     Head: Normocephalic and atraumatic.     Right Ear: External ear normal.     Left Ear: External ear normal.  Eyes:     Extraocular Movements: Extraocular movements intact.  Cardiovascular:     Rate and Rhythm: Normal rate.     Pulses: Normal pulses.  Pulmonary:     Effort: Pulmonary effort is normal. No respiratory distress.  Abdominal:     General: There is no distension.     Palpations: Abdomen is soft.  Musculoskeletal:        General: Tenderness present.     Cervical back: Neck supple.     Right lower leg: No edema.     Left lower leg: No edema.     Comments: Patient has good distal strength with no pain over the greater trochanters.  No clonus or focal weakness.  Skin:     Findings: No erythema, lesion or rash.  Neurological:     General: No focal deficit present.     Mental Status: She is alert and oriented to person, place, and time.     Sensory: No sensory deficit.     Motor: No weakness or abnormal muscle tone.     Coordination: Coordination normal.  Psychiatric:        Mood and Affect: Mood normal.        Behavior: Behavior normal.      Imaging: XR C-ARM NO REPORT  Result Date: 12/11/2021 Please see Notes tab for imaging impression.

## 2021-12-11 NOTE — Procedures (Signed)
Lumbar Facet Joint Nerve Denervation  Patient: Rose [name]      Date of Birth: 1955-10-02 MRN: [id number] PCP: Iona Coach, MD      Visit Date: 12/11/2021   Universal Protocol:    Date/Time: 09/13/239:19 PM  Consent Given By: the patient  Position: PRONE  Additional Comments: Vital signs were monitored before and after the procedure. Patient was prepped and draped in the usual sterile fashion. The correct patient, procedure, and site was verified.   Injection Procedure Details:   Procedure diagnoses:  1. Spondylosis without myelopathy or radiculopathy, lumbar region      Meds Administered:  Meds ordered this encounter  Medications   methylPREDNISolone acetate (DEPO-MEDROL) injection 40 mg     Laterality: Right  Location/Site:  L3-L4, L2 and L3 medial branches, L4-L5, L3 and L4 medial branches, and L5-S1, L4 medial branch and L5 dorsal ramus  Needle: 18 ga.,  63m active tip, 1068mRF Cannula  Needle Placement: Along juncture of superior articular process and transverse pocess  Findings:  -Comments:  Procedure Details: For each desired target nerve, the corresponding transverse process (sacral ala for the L5 dorsal rami) was identified and the fluoroscope was positioned to square off the endplates of the corresponding vertebral body to achieve a true AP midline view.  The beam was then obliqued 15 to 20 degrees and caudally tilted 15 to 20 degrees to line up a trajectory along the target nerves. The skin over the target of the junction of superior articulating process and transverse process (sacral ala for the L5 dorsal rami) was infiltrated with 20m25mf 1% Lidocaine without Epinephrine.  The 18 gauge 61m83mtive tip outer cannula was advanced in trajectory view to the target.  This procedure was repeated for each target nerve.  Then, for all levels, the outer cannula placement was fine-tuned and the position was then confirmed with bi-planar imaging.     Test stimulation was done both at sensory and motor levels to ensure there was no radicular stimulation. The target tissues were then infiltrated with 1 ml of 1% Lidocaine without Epinephrine. Subsequently, a percutaneous neurotomy was carried out for 90 seconds at 80 degrees Celsius.  After the completion of the lesion, 1 ml of injectate was delivered. It was then repeated for each facet joint nerve mentioned above. Appropriate radiographs were obtained to verify the probe placement during the neurotomy.   Additional Comments:  The patient tolerated the procedure well Dressing: 2 x 2 sterile gauze and Band-Aid    Post-procedure details: Patient was observed during the procedure. Post-procedure instructions were reviewed.  Patient left the clinic in stable condition.

## 2021-12-11 NOTE — Progress Notes (Signed)
Called and left VM regarding routine lab results. Her creatinine is elevated but based on recent values I think this is consistent with her baseline. She has a history of chronic renal insufficiency.

## 2021-12-19 ENCOUNTER — Other Ambulatory Visit: Payer: Self-pay

## 2021-12-20 MED ORDER — CETIRIZINE HCL 10 MG PO TABS: 10.0000 mg | ORAL_TABLET | Freq: Every day | ORAL | 11 refills | Status: DC

## 2021-12-25 ENCOUNTER — Other Ambulatory Visit: Payer: Self-pay

## 2021-12-25 DIAGNOSIS — I25118 Atherosclerotic heart disease of native coronary artery with other forms of angina pectoris: Secondary | ICD-10-CM

## 2021-12-25 NOTE — Telephone Encounter (Signed)
carvedilol (COREG) 6.25 MG tablet, refill request@ [id number] - [place], [place] [address].

## 2021-12-26 LAB — COLOGUARD

## 2021-12-29 MED ORDER — CARVEDILOL 6.25 MG PO TABS: 6.2500 mg | ORAL_TABLET | Freq: Two times a day (BID) | ORAL | 11 refills | Status: DC

## 2022-01-06 ENCOUNTER — Ambulatory Visit (HOSPITAL_COMMUNITY)
Admission: RE | Admit: 2022-01-06 | Discharge: 2022-01-06 | Disposition: A | Discharge status: Home / Self Care | Payer: Medicare Other | Source: Ambulatory Visit | Attending: Internal Medicine | Admitting: Internal Medicine

## 2022-01-06 ENCOUNTER — Ambulatory Visit (HOSPITAL_BASED_OUTPATIENT_CLINIC_OR_DEPARTMENT_OTHER)
Admission: RE | Admit: 2022-01-06 | Discharge: 2022-01-06 | Disposition: A | Discharge status: Home / Self Care | Payer: Medicare Other | Source: Ambulatory Visit | Attending: Cardiovascular Disease | Admitting: Cardiovascular Disease

## 2022-01-06 DIAGNOSIS — I739 Peripheral vascular disease, unspecified: Secondary | ICD-10-CM | POA: Insufficient documentation

## 2022-01-06 DIAGNOSIS — Z95828 Presence of other vascular implants and grafts: Secondary | ICD-10-CM | POA: Insufficient documentation

## 2022-01-09 ENCOUNTER — Ambulatory Visit: Payer: Medicare Other | Admitting: Podiatry

## 2022-01-10 ENCOUNTER — Ambulatory Visit: Payer: Medicare Other | Admitting: Podiatry

## 2022-01-13 ENCOUNTER — Other Ambulatory Visit: Payer: Self-pay | Admitting: *Deleted

## 2022-01-13 ENCOUNTER — Encounter: Payer: Self-pay | Admitting: *Deleted

## 2022-01-13 DIAGNOSIS — I739 Peripheral vascular disease, unspecified: Secondary | ICD-10-CM

## 2022-01-15 DIAGNOSIS — Z1211 Encounter for screening for malignant neoplasm of colon: Secondary | ICD-10-CM | POA: Diagnosis not present

## 2022-01-16 ENCOUNTER — Telehealth: Payer: Self-pay

## 2022-01-16 NOTE — Patient Outreach (Signed)
  Care Coordination   01/16/2022 Name: Rose [name] MRN: [id number] DOB: 16-Oct-1955   Care Coordination Outreach Attempts:  A second unsuccessful outreach was attempted today to offer the patient with information about available care coordination services as a benefit of their health plan.     Follow Up Plan:  Additional outreach attempts will be made to offer the patient care coordination information and services.   Encounter Outcome:  No Answer  Care Coordination Interventions Activated:  No   Care Coordination Interventions:  No, not indicated    [name], RN, BSN, CCM Care Management Coordinator [facility] Phone: [phone]: [phone]

## 2022-01-21 ENCOUNTER — Ambulatory Visit: Payer: Medicare Other | Admitting: Podiatrist

## 2022-01-22 LAB — COLOGUARD: COLOGUARD: NEGATIVE

## 2022-01-30 ENCOUNTER — Other Ambulatory Visit: Payer: Self-pay

## 2022-01-31 MED ORDER — EMPAGLIFLOZIN-METFORMIN HCL ER 10-1000 MG PO TB24: 1.0000 | ORAL_TABLET | Freq: Every day | ORAL | 2 refills | Status: DC

## 2022-02-05 ENCOUNTER — Encounter: Payer: Self-pay | Admitting: Podiatrist

## 2022-02-05 ENCOUNTER — Ambulatory Visit (INDEPENDENT_AMBULATORY_CARE_PROVIDER_SITE_OTHER): Payer: Medicare Other | Admitting: Podiatrist

## 2022-02-05 DIAGNOSIS — M79609 Pain in unspecified limb: Secondary | ICD-10-CM

## 2022-02-05 DIAGNOSIS — B351 Tinea unguium: Secondary | ICD-10-CM

## 2022-02-05 NOTE — Progress Notes (Signed)
Subjective: Rose [name] is a 66 y.o. female patient with history of diabetes who presents to office today with a chief concern of long,mildly painful nails  while ambulating in shoes; unable to trim.  Patient denies any new cramping, numbness, burning or tingling in the legs.   Iona Coach, MD  is her primary care provider.  Patient Active Problem List   Diagnosis Date Noted   Tension headache 12/10/2021   Therapeutic opioid-induced constipation (OIC) 12/10/2021   Fall 07/01/2021   Iron deficiency anemia 11/15/2020   Hepatic steatosis    CAD s/p DES to LAD (12/2019) 01/17/2020   PAD s/p PCI with stent 08/18/2019   Allergic rhinitis 10/28/2018   Diabetic neuropathy ([place]) 06/02/2017   Hyperlipidemia associated with type 2 diabetes mellitus ([place]) 12/13/2015   Tobacco use disorder 12/13/2015   Chronic renal insufficiency 12/12/2015   Depression 12/12/2015   Methadone maintenance therapy patient ([place])    History of substance use disorder    Chronic back pain    Preventative health care 12/14/2014   Type 2 diabetes mellitus ([place]) 02/27/2012   Essential hypertension 02/27/2012   Current Outpatient Medications on File Prior to Visit  Medication Sig Dispense Refill   albuterol (VENTOLIN HFA) 108 (90 Base) MCG/ACT inhaler Inhale 2 puffs into the lungs every 6 (six) hours as needed. For wheezing 1 each 2   atorvastatin (LIPITOR) 40 MG tablet TAKE 1 TABLET(40 MG) BY MOUTH DAILY 90 tablet 6   atorvastatin (LIPITOR) 80 MG tablet Take 1 tablet (80 mg total) by mouth daily. 30 tablet 11   carvedilol (COREG) 6.25 MG tablet Take 1 tablet (6.25 mg total) by mouth 2 (two) times daily. 60 tablet 11   cetirizine (ZYRTEC) 10 MG tablet Take 1 tablet (10 mg total) by mouth daily. 30 tablet 11   clopidogrel (PLAVIX) 75 MG tablet Take 1 tablet (75 mg total) by mouth daily with breakfast. 90 tablet 3   DULoxetine (CYMBALTA) 20 MG capsule Take 3 capsules (60 mg total) by mouth daily. 90 capsule  5   Empagliflozin-metFORMIN HCl ER 12-998 MG TB24 Take 1 tablet by mouth daily. 60 tablet 2   fluticasone (FLONASE) 50 MCG/ACT nasal spray One spray in each nostril daily 16 g 5   hydrochlorothiazide (HYDRODIURIL) 25 MG tablet Take 1 tablet (25 mg total) by mouth daily. 90 tablet 3   methadone (DOLOPHINE) 10 MG/5ML solution Take 126 mg by mouth daily.      Multiple Vitamins-Minerals (CENTRUM WOMEN) TABS Take 1 tablet by mouth daily.     nicotine (NICODERM CQ - DOSED IN MG/24 HOURS) 14 mg/24hr patch Medicaid [id number] L Wks 1-6: 14 mg x 1 patch daily. Wear for 24 hours. If you have sleep disturbances, remove at bedtime. (Patient taking differently: Place 14 mg onto the skin See admin instructions. Medicaid [id number] L Wks 1-6: 14 mg x 1 patch daily. Wear for 24 hours. If you have sleep disturbances, remove at bedtime.) 30 patch 2   Sennosides (SENNA) 8.6 MG CAPS Take 2 tablets by mouth daily. 60 capsule 2   No current facility-administered medications on file prior to visit.   Allergies  Allergen Reactions   Lisinopril Swelling    Angioedema   Gabapentin Hives and Other (See Comments)    Only when takes over 300 mg dose. "gives me the shakes"   Vicodin [Hydrocodone-Acetaminophen] Hives, Diarrhea and Other (See Comments)    Stomach  cramps   Hydrocodone-Acetaminophen       Objective: General: Patient is  awake, alert, and oriented x 3 and in no acute distress.  Integument: Skin is warm, dry and supple bilateral. Nails are tender, long, thickened and  dystrophic with subungual debris, consistent with onychomycosis, 1-5 bilateral. No signs of infection. No open lesions or preulcerative lesions present bilateral. Remaining integument unremarkable.  Vasculature:  Dorsalis Pedis pulse 1 /4 bilateral. Posterior Tibial pulse  1/4 bilateral.  Capillary fill time <3 sec 1-5 bilateral. Temperature gradient within normal limits. No varicosities present bilateral. No edema present bilateral.    Neurology: The patient has decreased sensation measured with a 5.07/10g Semmes Weinstein Monofilament at  2/5 pedal sites bilateral . Vibratory sensation diminished bilateral with tuning fork. No Babinski sign present bilateral.   Musculoskeletal: No symptomatic pedal deformities noted bilateral. Muscular strength 5/5 in all lower extremity muscular groups bilateral without pain on range of motion . No tenderness with calf compression bilateral.  Assessment and Plan:   ICD-10-CM   1. Pain due to onychomycosis of nail  B35.1    M79.609         -Examined patient. -Discussed and educated patient on diabetic foot care, especially with  regards to the vascular, neurological and musculoskeletal systems.  -Stressed the importance of good glycemic control and the detriment of not  controlling glucose levels in relation to the foot. -Mechanically debrided all nails 1-5 bilateral using sterile nail nipper and filed with dremel without incident  -Answered all patient questions -Patient to return  in 3 months for at risk foot care -Patient advised to call the office if any problems or questions arise in the meantime.  Bronson Ing, DPM

## 2022-03-03 ENCOUNTER — Other Ambulatory Visit: Payer: Self-pay

## 2022-03-03 DIAGNOSIS — G8929 Other chronic pain: Secondary | ICD-10-CM

## 2022-03-03 DIAGNOSIS — I25118 Atherosclerotic heart disease of native coronary artery with other forms of angina pectoris: Secondary | ICD-10-CM

## 2022-03-03 DIAGNOSIS — E1142 Type 2 diabetes mellitus with diabetic polyneuropathy: Secondary | ICD-10-CM

## 2022-03-03 MED ORDER — ATORVASTATIN CALCIUM 40 MG PO TABS: ORAL_TABLET | ORAL | 6 refills | Status: DC

## 2022-03-03 MED ORDER — ATORVASTATIN CALCIUM 80 MG PO TABS: 80.0000 mg | ORAL_TABLET | Freq: Every day | ORAL | 11 refills | Status: DC

## 2022-03-03 MED ORDER — DULOXETINE HCL 20 MG PO CPEP: 60.0000 mg | ORAL_CAPSULE | Freq: Every day | ORAL | 5 refills | Status: DC

## 2022-03-03 MED ORDER — CARVEDILOL 6.25 MG PO TABS: 6.2500 mg | ORAL_TABLET | Freq: Two times a day (BID) | ORAL | 11 refills | Status: DC

## 2022-03-03 NOTE — Telephone Encounter (Signed)
  atorvastatin (LIPITOR) 80 MG tablet  DULoxetine (CYMBALTA) 20 MG capsu   [id number] - , [place] - [address]

## 2022-03-11 ENCOUNTER — Encounter: Payer: Medicare Other | Admitting: Student

## 2022-03-11 NOTE — Patient Instructions (Incomplete)
HTN Losartan 25 mg Carvedilol 6.25 mg BID   PAD s/p PCI with stent Should be on Plavix 75 mg and atorvastatin 80 mg ABI repeat?    CAD s/p DES to LAD Hx of stable anginal Chest pain   DM A1c of 6.2 in July GFR 38 in July 2023 Now on Empagliflozin-metformin 12-998 mg Second dose of metformin   Neuropathy and depression On Duloxetine 60 mg   HLD Stable on Atorvastatin 40 mg  Constipation 2/2 methadone therapy -On daily Senna now  Chronic back pain Start robaxin 750 mg four times daily prn -Referedl to orthopedic clinic for joint injection -referred to PT - DME Rolator with seat ordered, received? -Methadone clinic  Tension headache Throbbing, associated with stress. How often? Conservative management

## 2022-03-26 ENCOUNTER — Ambulatory Visit (INDEPENDENT_AMBULATORY_CARE_PROVIDER_SITE_OTHER): Payer: Medicare Other

## 2022-03-26 ENCOUNTER — Ambulatory Visit: Payer: Medicare Other | Admitting: Podiatry

## 2022-03-26 VITALS — BP 100/58 | HR 77 | Temp 97.8°F | Ht 63.0 in | Wt 141.8 lb

## 2022-03-26 DIAGNOSIS — E1169 Type 2 diabetes mellitus with other specified complication: Secondary | ICD-10-CM | POA: Diagnosis not present

## 2022-03-26 DIAGNOSIS — S91301A Unspecified open wound, right foot, initial encounter: Secondary | ICD-10-CM | POA: Diagnosis not present

## 2022-03-26 DIAGNOSIS — M869 Osteomyelitis, unspecified: Secondary | ICD-10-CM | POA: Diagnosis present

## 2022-03-26 DIAGNOSIS — Z7984 Long term (current) use of oral hypoglycemic drugs: Secondary | ICD-10-CM | POA: Diagnosis not present

## 2022-03-26 LAB — POCT GLYCOSYLATED HEMOGLOBIN (HGB A1C): Hemoglobin A1C: 6.8 % — AB (ref 4.0–5.6)

## 2022-03-26 LAB — GLUCOSE, CAPILLARY: Glucose-Capillary: 171 mg/dL — ABNORMAL HIGH (ref 70–99)

## 2022-03-26 NOTE — Patient Instructions (Signed)
[name], it was a pleasure seeing you today! You endorsed feeling well today. Below are some of the things we talked about this visit. We look forward to seeing you in the follow up appointment!  Today we discussed: Foot wound: please go to your podiatry appointment today at 1pm. They will let you know about dressing changes, but I would only use dry dressings. I dont think you need antibiotics right now. Please give [place] a call if you start noticing discharge, redness in the skin, or worse pain around the toe.    I have ordered the following labs today:   Lab Orders         Glucose, capillary         POC Hbg A1C       Referrals ordered today:   Referral Orders  No referral(s) requested today     I have ordered the following medication/changed the following medications:   Stop the following medications: There are no discontinued medications.   Start the following medications: No orders of the defined types were placed in this encounter.    Follow-up:  4 weeks    Please make sure to arrive 15 minutes prior to your next appointment. If you arrive late, you may be asked to reschedule.   We look forward to seeing you next time. Please call our clinic at [phone] if you have any questions or concerns. The best time to call is Monday-Friday from 9am-4pm, but there is someone available 24/7. If after hours or the weekend, call the main hospital number and ask for the Internal Medicine Resident On-Call. If you need medication refills, please notify your pharmacy one week in advance and they will send [place] a request.  Thank you for letting us take part in your care. Wishing you the best!  Thank you, Linus Galas MD

## 2022-03-26 NOTE — Assessment & Plan Note (Signed)
Patient presented with a wound on right big toe.  She stubbed her toe about 4 days ago and noticed a small cut but the wound has gotten worse since that point.  The skin is putting on the plantar surface and the skin at her distal toe looks darker and more swollen.  Sensation is present and she is able to move the toe still.  She has been using Band-Aids and Neosporin to cover the wound.  She also has chronic lower extremity venous stasis changes and calluses on bilateral feet likely requiring pressure offloading.  On exam, macerated wound with significant separation of entire dermal layer underneath right big toe with surrounding induration but no significant discharge, no erythema, mild tenderness to palpation.  No skin changes and foot or leg.  Does have calluses of the flexor surfaces of other toes as well as base of fifth metatarsal.  DP pulse is detected by Doppler and biphasic, but weak.  No separation of nails.  Overall, does not seem infected at this point but is a significant wound.  She has pulses Present on Doppler but likely poor blood supply overall to the extremity.  Will likely require close follow-up with podiatry and return visit to our clinic to monitor.  Discharged with wound care instructions and materials with follow-up podiatry appointment this afternoon.  Provided return precautions and can consider antibiotics if her wound worsens.

## 2022-03-26 NOTE — Progress Notes (Signed)
CC: toe wound  HPI:  Rose [name] is a 66 y.o.-year-old female with past medical history as below presenting for toe wound.  Please see encounters tab for problem-based charting.  Past Medical History:  Diagnosis Date   ACE inhibitor-aggravated angioedema, initial encounter 04/26/2016   Allergic rhinitis    Anxiety    Arthritis    Calculus of gallbladder without cholecystitis without obstruction 10/28/2018   Chronic back pain    CKD (chronic kidney disease), stage III ([place])    folllowed by pcp   Coronary artery disease    Depression    Diabetic neuropathy ([place])    Edema of lower extremity 10/28/2018   Encounter for completion of form with patient 06/21/2019   Environmental allergies    Environmental allergies 12/28/2015   Fibromyalgia    Foot ulcer secondary to diabetes 06/21/2019   Frequency of urination    History of chronic bronchitis    History of drug abuse ([place]) states quit herion in 2006   History of hepatitis C last abd ultrasound in epic 11-10-2018;  last hepative panel in epic 11-30-2018 was normal   previously followed by [facility] (notes in care everywhere)-- dx 04/ 2015,  started harvonic treatment 07/ 2015   History of rib fracture 10/28/2018   HPV in female 06/11/2016   Hypertension    followed by pcp  (nuclear stress test 01-15-2017 in epic,  showed low risk normal w/ nuclear ef 55%)   Laceration of right foot 03/04/2019   Lipoma of neck 07/16/2017   Seen by general surgery Dr Marlou Starks May 16 - plan for removal of 2cm sebaceous cyst.    Methadone maintenance therapy patient ([place])    Mild asthma    followed by pcp   Nocturia    PAD (peripheral artery disease) ([place])    followed by cardiology, dr Fletcher Anon---  06-01-2019  s/p bilateral CIA angioplasty stenting   Recurrent falls 04/30/2016   Right foot ulcer ([place])    Symptomatic anemia 11/15/2020   Tendonitis    bilateral wrist   Type 2 diabetes mellitus ([place])    followed by pcp    (06-16-2019  per pt checks blood sugar every other day in am,  fasting sugar 92-95)   Urge urinary incontinence    Review of Systems: As in HPI.  Please see encounters tab for problem based charting.  Physical Exam:  Vitals:   03/26/22 0859  BP: (!) 100/58  Pulse: 77  Temp: 97.8 F (36.6 C)  TempSrc: Oral  SpO2: 99%  Weight: 141 lb 12.8 oz (64.3 kg)  Height: '5\' 3"'$  (1.6 m)   General:Well-appearing, pleasant, In NAD Cardiac: RRR, no murmurs rubs or gallops. Respiratory: Normal work of breathing on room air, CTAB Abdominal: Soft, nontender, nondistended Extremities: macerated wound with significant separation of entire dermal layer underneath right big toe with surrounding induration but no significant discharge, no erythema, mild tenderness to palpation. No skin changes and foot or leg. Does have calluses of the flexor surfaces of other toes as well as base of fifth metatarsal. DP pulse is detected by Doppler and biphasic, but weak. No separation of nails.     Media Information     Assessment & Plan:   Wound of right foot Patient presented with a wound on right big toe.  She stubbed her toe about 4 days ago and noticed a small cut but the wound has gotten worse since that point.  The skin is putting on  the plantar surface and the skin at her distal toe looks darker and more swollen.  Sensation is present and she is able to move the toe still.  She has been using Band-Aids and Neosporin to cover the wound.  She also has chronic lower extremity venous stasis changes and calluses on bilateral feet likely requiring pressure offloading.  On exam, macerated wound with significant separation of entire dermal layer underneath right big toe with surrounding induration but no significant discharge, no erythema, mild tenderness to palpation.  No skin changes and foot or leg.  Does have calluses of the flexor surfaces of other toes as well as base of fifth metatarsal.  DP pulse is detected by  Doppler and biphasic, but weak.  No separation of nails.  Overall, does not seem infected at this point but is a significant wound.  She has pulses Present on Doppler but likely poor blood supply overall to the extremity.  Will likely require close follow-up with podiatry and return visit to our clinic to monitor.  Discharged with wound care instructions and materials with follow-up podiatry appointment this afternoon.  Provided return precautions and can consider antibiotics if her wound worsens.      Patient seen with Dr. Jimmye Norman

## 2022-03-28 ENCOUNTER — Ambulatory Visit (INDEPENDENT_AMBULATORY_CARE_PROVIDER_SITE_OTHER): Payer: Medicare Other | Admitting: Podiatry

## 2022-03-28 DIAGNOSIS — E1169 Type 2 diabetes mellitus with other specified complication: Secondary | ICD-10-CM | POA: Diagnosis not present

## 2022-03-28 DIAGNOSIS — T148XXA Other injury of unspecified body region, initial encounter: Secondary | ICD-10-CM

## 2022-03-28 NOTE — Progress Notes (Signed)
Subjective:  Patient ID: Rose [name], female    DOB: 1956/01/08,  MRN: [id number]  Chief Complaint  Patient presents with   Toe Injury    Patient states he stubbed toe a few days ago went to the ED and they told her to f/u with [place].      66 y.o. female presents with the above complaint.  Patient presents with right submetatarsal 1 friction blister.  Patient states that she may have stubbed the toe as well went to the ED and they told her to follow-up with [place].  She has not seen anyone else prior to seeing me denies any other acute complaints.  She is a diabetic and isWorried about wound getting worse.  She is not taking any antibiotics denies any other acute complaints.  Would like to discuss treatment options for this.   Review of Systems: Negative except as noted in the HPI. Denies N/V/F/Ch.  Past Medical History:  Diagnosis Date   ACE inhibitor-aggravated angioedema, initial encounter 04/26/2016   Allergic rhinitis    Anxiety    Arthritis    Calculus of gallbladder without cholecystitis without obstruction 10/28/2018   Chronic back pain    CKD (chronic kidney disease), stage III ([place])    folllowed by pcp   Coronary artery disease    Depression    Diabetic neuropathy ([place])    Edema of lower extremity 10/28/2018   Encounter for completion of form with patient 06/21/2019   Environmental allergies    Environmental allergies 12/28/2015   Fibromyalgia    Foot ulcer secondary to diabetes 06/21/2019   Frequency of urination    History of chronic bronchitis    History of drug abuse ([place]) states quit herion in 2006   History of hepatitis C last abd ultrasound in epic 11-10-2018;  last hepative panel in epic 11-30-2018 was normal   previously followed by [facility] (notes in care everywhere)-- dx 04/ 2015,  started harvonic treatment 07/ 2015   History of rib fracture 10/28/2018   HPV in female 06/11/2016   Hypertension    followed by pcp  (nuclear stress test  01-15-2017 in epic,  showed low risk normal w/ nuclear ef 55%)   Laceration of right foot 03/04/2019   Lipoma of neck 07/16/2017   Seen by general surgery Dr Marlou Starks May 16 - plan for removal of 2cm sebaceous cyst.    Methadone maintenance therapy patient ([place])    Mild asthma    followed by pcp   Nocturia    PAD (peripheral artery disease) ([place])    followed by cardiology, dr Fletcher Anon---  06-01-2019  s/p bilateral CIA angioplasty stenting   Recurrent falls 04/30/2016   Right foot ulcer ([place])    Symptomatic anemia 11/15/2020   Tendonitis    bilateral wrist   Type 2 diabetes mellitus ([place])    followed by pcp   (06-16-2019  per pt checks blood sugar every other day in am,  fasting sugar 92-95)   Urge urinary incontinence     Current Outpatient Medications:    albuterol (VENTOLIN HFA) 108 (90 Base) MCG/ACT inhaler, Inhale 2 puffs into the lungs every 6 (six) hours as needed. For wheezing, Disp: 1 each, Rfl: 2   atorvastatin (LIPITOR) 40 MG tablet, TAKE 1 TABLET(40 MG) BY MOUTH DAILY, Disp: 90 tablet, Rfl: 6   atorvastatin (LIPITOR) 80 MG tablet, Take 1 tablet (80 mg total) by mouth daily., Disp: 30 tablet, Rfl: 11   carvedilol (COREG) 6.25  MG tablet, Take 1 tablet (6.25 mg total) by mouth 2 (two) times daily., Disp: 60 tablet, Rfl: 11   cetirizine (ZYRTEC) 10 MG tablet, Take 1 tablet (10 mg total) by mouth daily., Disp: 30 tablet, Rfl: 11   clopidogrel (PLAVIX) 75 MG tablet, Take 1 tablet (75 mg total) by mouth daily with breakfast., Disp: 90 tablet, Rfl: 3   DULoxetine (CYMBALTA) 20 MG capsule, Take 3 capsules (60 mg total) by mouth daily., Disp: 90 capsule, Rfl: 5   Empagliflozin-metFORMIN HCl ER 12-998 MG TB24, Take 1 tablet by mouth daily., Disp: 60 tablet, Rfl: 2   fluticasone (FLONASE) 50 MCG/ACT nasal spray, One spray in each nostril daily, Disp: 16 g, Rfl: 5   hydrochlorothiazide (HYDRODIURIL) 25 MG tablet, Take 1 tablet (25 mg total) by mouth daily., Disp: 90 tablet, Rfl: 3   methadone  (DOLOPHINE) 10 MG/5ML solution, Take 126 mg by mouth daily. , Disp: , Rfl:    Multiple Vitamins-Minerals (CENTRUM WOMEN) TABS, Take 1 tablet by mouth daily., Disp: , Rfl:    nicotine (NICODERM CQ - DOSED IN MG/24 HOURS) 14 mg/24hr patch, Medicaid [id number] L Wks 1-6: 14 mg x 1 patch daily. Wear for 24 hours. If you have sleep disturbances, remove at bedtime. (Patient taking differently: Place 14 mg onto the skin See admin instructions. Medicaid [id number] L Wks 1-6: 14 mg x 1 patch daily. Wear for 24 hours. If you have sleep disturbances, remove at bedtime.), Disp: 30 patch, Rfl: 2   Sennosides (SENNA) 8.6 MG CAPS, Take 2 tablets by mouth daily., Disp: 60 capsule, Rfl: 2  Social History   Tobacco Use  Smoking Status Some Days   Packs/day: 0.50   Years: 20.00   Total pack years: 10.00   Types: Cigarettes  Smokeless Tobacco Never  Tobacco Comments   SMOKES 4 CIGS PER DAY    Allergies  Allergen Reactions   Lisinopril Swelling    Angioedema   Gabapentin Hives and Other (See Comments)    Only when takes over 300 mg dose. "gives me the shakes"   Vicodin [Hydrocodone-Acetaminophen] Hives, Diarrhea and Other (See Comments)    Stomach  cramps   Hydrocodone-Acetaminophen    Objective:  There were no vitals filed for this visit. There is no height or weight on file to calculate BMI. Constitutional Well developed. Well nourished.  Vascular Dorsalis pedis pulses palpable bilaterally. Posterior tibial pulses palpable bilaterally. Capillary refill normal to all digits.  No cyanosis or clubbing noted. Pedal hair growth normal.  Neurologic Normal speech. Oriented to person, place, and time. Epicritic sensation to light touch grossly present bilaterally.  Dermatologic Right hallux friction blister noted without a deeper wound injury.  No erythema noted.  No abnormalities noted.  No purulent drainage noted.  Orthopedic: Normal joint ROM without pain or crepitus bilaterally. No visible  deformities. No bony tenderness.   Radiographs: None Assessment:   1. Friction blister   2. Type 2 diabetes mellitus with other specified complication, unspecified whether long term insulin use ([place])    Plan:  Patient was evaluated and treated and all questions answered.  Right submet 1 friction blister -All question and concerns were discussed with the patient in extensive detail.  She is a diabetic with controlled sugars.  She would benefit from Betadine wet-to-dry dressing for next 4 weeks.  She will also benefit from surgical shoe.  Surgical shoe was dispensed. -I discussed prevention technique as well.  I will see her back in 4 weeks and discuss further preventative  care.  No follow-ups on file.  Right submetatarsal 1 friction blister diabetic controlled surgical shoe Betadine wet-to-dry dressing for next 4 weeks

## 2022-04-04 NOTE — Progress Notes (Signed)
Internal Medicine Clinic Attending  I saw and evaluated the patient.  I personally confirmed the key portions of the history and exam documented by Dr. Jodell Cipro and I reviewed pertinent patient test results.  The assessment, diagnosis, and plan were formulated together and I agree with the documentation in the resident's note. We were fortunately able to get an appt later same day with podiatrist as she will be needing an offloading shoe and recommendations for wound care in this high risk foot.

## 2022-04-23 ENCOUNTER — Encounter: Payer: Self-pay | Admitting: Internal Medicine

## 2022-04-23 ENCOUNTER — Ambulatory Visit (INDEPENDENT_AMBULATORY_CARE_PROVIDER_SITE_OTHER): Payer: 59 | Admitting: Internal Medicine

## 2022-04-23 VITALS — BP 134/60 | HR 79 | Temp 98.6°F | Ht 63.0 in | Wt 144.4 lb

## 2022-04-23 DIAGNOSIS — S91301A Unspecified open wound, right foot, initial encounter: Secondary | ICD-10-CM | POA: Diagnosis not present

## 2022-04-23 NOTE — Patient Instructions (Signed)
Dear Mrs. Blankenbeckler,  Thank you for trusting [place] with your care today.  We evaluated your toe wound. Fortunately, it does not appear to be infected at this point. It is healing much slower than we would expect. Please ensure that you follow up with the podiatrist tomorrow.   For the crick in your neck and headache, please try using heating pads, massages, and stretches. If the headache does not improve in  week with the heat, massage, and stretching, please give [place] a call back and we can schedule you to be seen again.   Please follow up in 2 months for your diabetes.

## 2022-04-23 NOTE — Progress Notes (Unsigned)
   CC: 4 week follow up  HPI:[name] is a 67 y.o. female who presents for evaluation of foot wound. Please see individual problem based A/P for details.  42 yof with hx of HTN, DM, HLD CAD and PAD s/p stenting RLE presenting for follow up of toe wound  Depression, PHQ-9: Based on the patients  [place] Visit from 04/23/2022 in [place]  PHQ-9 Total Score 4      score we have .  Past Medical History:  Diagnosis Date   ACE inhibitor-aggravated angioedema, initial encounter 04/26/2016   Allergic rhinitis    Anxiety    Arthritis    Calculus of gallbladder without cholecystitis without obstruction 10/28/2018   Chronic back pain    CKD (chronic kidney disease), stage III ([place])    folllowed by pcp   Coronary artery disease    Depression    Diabetic neuropathy ([place])    Edema of lower extremity 10/28/2018   Encounter for completion of form with patient 06/21/2019   Environmental allergies    Environmental allergies 12/28/2015   Fibromyalgia    Foot ulcer secondary to diabetes 06/21/2019   Frequency of urination    History of chronic bronchitis    History of drug abuse ([place]) states quit herion in 2006   History of hepatitis C last abd ultrasound in epic 11-10-2018;  last hepative panel in epic 11-30-2018 was normal   previously followed by [facility] (notes in care everywhere)-- dx 04/ 2015,  started harvonic treatment 07/ 2015   History of rib fracture 10/28/2018   HPV in female 06/11/2016   Hypertension    followed by pcp  (nuclear stress test 01-15-2017 in epic,  showed low risk normal w/ nuclear ef 55%)   Laceration of right foot 03/04/2019   Lipoma of neck 07/16/2017   Seen by general surgery Dr Marlou Starks May 16 - plan for removal of 2cm sebaceous cyst.    Methadone maintenance therapy patient ([place])    Mild asthma    followed by pcp   Nocturia    PAD (peripheral artery disease) ([place])    followed by cardiology,  dr Fletcher Anon---  06-01-2019  s/p bilateral CIA angioplasty stenting   Recurrent falls 04/30/2016   Right foot ulcer ([place])    Symptomatic anemia 11/15/2020   Tendonitis    bilateral wrist   Type 2 diabetes mellitus ([place])    followed by pcp   (06-16-2019  per pt checks blood sugar every other day in am,  fasting sugar 92-95)   Urge urinary incontinence    Review of Systems:   See HPI  Physical Exam: Vitals:   04/23/22 1037  BP: 134/60  Pulse: 79  Temp: 98.6 F (37 C)  TempSrc: Oral  SpO2: 100%  Weight: 144 lb 6.4 oz (65.5 kg)  Height: '5\' 3"'$  (1.6 m)   General: NAD HEENT: Conjunctiva nl , antiicteric sclerae, moist mucous membranes, no exudate or erythema Cardiovascular: Normal rate, regular rhythm.  No murmurs, rubs, or gallops Pulmonary : Equal breath sounds, No wheezes, rales, or rhonchi Abdominal: soft, nontender,  bowel sounds present Ext: No edema in lower extremities, no tenderness to palpation of lower extremities.   Assessment & Plan:   See Encounters Tab for problem based charting.  Patient discussed with Dr. Evette Doffing

## 2022-04-24 ENCOUNTER — Encounter: Payer: Self-pay | Admitting: Internal Medicine

## 2022-04-24 ENCOUNTER — Ambulatory Visit (INDEPENDENT_AMBULATORY_CARE_PROVIDER_SITE_OTHER): Payer: 59 | Admitting: Podiatry

## 2022-04-24 DIAGNOSIS — E1169 Type 2 diabetes mellitus with other specified complication: Secondary | ICD-10-CM

## 2022-04-24 DIAGNOSIS — T148XXA Other injury of unspecified body region, initial encounter: Secondary | ICD-10-CM | POA: Diagnosis not present

## 2022-04-24 NOTE — Progress Notes (Signed)
Internal Medicine Clinic Attending ° °Case discussed with Dr. Gawaluck  At the time of the visit.  We reviewed the resident’s history and exam and pertinent patient test results.  I agree with the assessment, diagnosis, and plan of care documented in the resident’s note.  °

## 2022-04-24 NOTE — Progress Notes (Signed)
Subjective:  Patient ID: Rose [name], female    DOB: Sep 21, 1955,  MRN: [id number]  Chief Complaint  Patient presents with   Foot Ulcer    Follow up on wound     67 y.o. female presents with the above complaint.  Patient presents for follow-up of right submetatarsal 1 blister with now superficial wound.  She went to get it evaluated discuss next treatment plan.  She did been doing Betadine wet-to-dry dressing   Review of Systems: Negative except as noted in the HPI. Denies N/V/F/Ch.  Past Medical History:  Diagnosis Date   ACE inhibitor-aggravated angioedema, initial encounter 04/26/2016   Allergic rhinitis    Anxiety    Arthritis    Calculus of gallbladder without cholecystitis without obstruction 10/28/2018   Chronic back pain    CKD (chronic kidney disease), stage III ([place])    folllowed by pcp   Coronary artery disease    Depression    Diabetic neuropathy ([place])    Edema of lower extremity 10/28/2018   Encounter for completion of form with patient 06/21/2019   Environmental allergies    Environmental allergies 12/28/2015   Fibromyalgia    Foot ulcer secondary to diabetes 06/21/2019   Frequency of urination    History of chronic bronchitis    History of drug abuse ([place]) states quit herion in 2006   History of hepatitis C last abd ultrasound in epic 11-10-2018;  last hepative panel in epic 11-30-2018 was normal   previously followed by [facility] (notes in care everywhere)-- dx 04/ 2015,  started harvonic treatment 07/ 2015   History of rib fracture 10/28/2018   HPV in female 06/11/2016   Hypertension    followed by pcp  (nuclear stress test 01-15-2017 in epic,  showed low risk normal w/ nuclear ef 55%)   Laceration of right foot 03/04/2019   Lipoma of neck 07/16/2017   Seen by general surgery Dr Marlou Starks May 16 - plan for removal of 2cm sebaceous cyst.    Methadone maintenance therapy patient ([place])    Mild asthma    followed by pcp   Nocturia    PAD  (peripheral artery disease) ([place])    followed by cardiology, dr Fletcher Anon---  06-01-2019  s/p bilateral CIA angioplasty stenting   Recurrent falls 04/30/2016   Right foot ulcer ([place])    Symptomatic anemia 11/15/2020   Tendonitis    bilateral wrist   Type 2 diabetes mellitus ([place])    followed by pcp   (06-16-2019  per pt checks blood sugar every other day in am,  fasting sugar 92-95)   Urge urinary incontinence     Current Outpatient Medications:    albuterol (VENTOLIN HFA) 108 (90 Base) MCG/ACT inhaler, Inhale 2 puffs into the lungs every 6 (six) hours as needed. For wheezing, Disp: 1 each, Rfl: 2   atorvastatin (LIPITOR) 40 MG tablet, TAKE 1 TABLET(40 MG) BY MOUTH DAILY, Disp: 90 tablet, Rfl: 6   atorvastatin (LIPITOR) 80 MG tablet, Take 1 tablet (80 mg total) by mouth daily., Disp: 30 tablet, Rfl: 11   carvedilol (COREG) 6.25 MG tablet, Take 1 tablet (6.25 mg total) by mouth 2 (two) times daily., Disp: 60 tablet, Rfl: 11   cetirizine (ZYRTEC) 10 MG tablet, Take 1 tablet (10 mg total) by mouth daily., Disp: 30 tablet, Rfl: 11   clopidogrel (PLAVIX) 75 MG tablet, Take 1 tablet (75 mg total) by mouth daily with breakfast., Disp: 90 tablet, Rfl: 3   DULoxetine (  CYMBALTA) 20 MG capsule, Take 3 capsules (60 mg total) by mouth daily., Disp: 90 capsule, Rfl: 5   Empagliflozin-metFORMIN HCl ER 12-998 MG TB24, Take 1 tablet by mouth daily., Disp: 60 tablet, Rfl: 2   fluticasone (FLONASE) 50 MCG/ACT nasal spray, One spray in each nostril daily, Disp: 16 g, Rfl: 5   hydrochlorothiazide (HYDRODIURIL) 25 MG tablet, Take 1 tablet (25 mg total) by mouth daily., Disp: 90 tablet, Rfl: 3   methadone (DOLOPHINE) 10 MG/5ML solution, Take 126 mg by mouth daily. , Disp: , Rfl:    Multiple Vitamins-Minerals (CENTRUM WOMEN) TABS, Take 1 tablet by mouth daily., Disp: , Rfl:    nicotine (NICODERM CQ - DOSED IN MG/24 HOURS) 14 mg/24hr patch, Medicaid [id number] L Wks 1-6: 14 mg x 1 patch daily. Wear for 24 hours. If you  have sleep disturbances, remove at bedtime. (Patient taking differently: Place 14 mg onto the skin See admin instructions. Medicaid [id number] L Wks 1-6: 14 mg x 1 patch daily. Wear for 24 hours. If you have sleep disturbances, remove at bedtime.), Disp: 30 patch, Rfl: 2   Sennosides (SENNA) 8.6 MG CAPS, Take 2 tablets by mouth daily., Disp: 60 capsule, Rfl: 2  Social History   Tobacco Use  Smoking Status Some Days   Packs/day: 0.50   Years: 20.00   Total pack years: 10.00   Types: Cigarettes  Smokeless Tobacco Never  Tobacco Comments   SMOKES 4 CIGS PER DAY    Allergies  Allergen Reactions   Lisinopril Swelling    Angioedema   Gabapentin Hives and Other (See Comments)    Only when takes over 300 mg dose. "gives me the shakes"   Vicodin [Hydrocodone-Acetaminophen] Hives, Diarrhea and Other (See Comments)    Stomach  cramps   Hydrocodone-Acetaminophen    Objective:  There were no vitals filed for this visit. There is no height or weight on file to calculate BMI. Constitutional Well developed. Well nourished.  Vascular Dorsalis pedis pulses palpable bilaterally. Posterior tibial pulses palpable bilaterally. Capillary refill normal to all digits.  No cyanosis or clubbing noted. Pedal hair growth normal.  Neurologic Normal speech. Oriented to person, place, and time. Epicritic sensation to light touch grossly present bilaterally.  Dermatologic Right hallux friction with a superficial wound.  Patient has been applying Betadine wet-to-dry dressing no erythema noted.  No abnormalities noted.  No purulent drainage noted.  Orthopedic: Normal joint ROM without pain or crepitus bilaterally. No visible deformities. No bony tenderness.   Radiographs: None Assessment:   No diagnosis found.  Plan:  Patient was evaluated and treated and all questions answered.  Right submet 1 friction blister -All question and concerns were discussed with the patient in extensive detail.  She is a  diabetic with controlled sugars.   -Will continue Santyl wet-to-dry there is seems to be more granulation tissue present. -I discussed prevention technique as well.  I will see her back in 4 weeks and discuss further preventative care.  No follow-ups on file.  Wound is about the same.  But improving slowly continue Santyl

## 2022-04-24 NOTE — Assessment & Plan Note (Signed)
Patient saw podiatry 12/29 for fricton blister. Betadine wet-to-dry dressing changes for 4 weeks and surgical shoe were recommended. Has been using the betadine each day until roughly 1 week ago. Started using Santyl 1 week ago. Notes wound has started healing better with this. Minimal pain. Denies drainage. She plans to see podiatry 04/24/22.  Wound appears largely unchanged from old photos. No sign of active infection, drainage, or erythema. There is still large callus on distal great toe that appears largely unchanged from prior as well. Sensation and movement of toes intact.   I am concerned that this wound is not healing as quickly as would like. She does have PAD with stent in his leg as well as DM which is likely impairing healing. Fortunately, no concern for  infection at this time. Plan to continue current wound care regimen and follow up with podiatry.

## 2022-04-30 DIAGNOSIS — H25813 Combined forms of age-related cataract, bilateral: Secondary | ICD-10-CM | POA: Diagnosis not present

## 2022-04-30 DIAGNOSIS — E119 Type 2 diabetes mellitus without complications: Secondary | ICD-10-CM | POA: Diagnosis not present

## 2022-05-08 ENCOUNTER — Ambulatory Visit: Payer: 59 | Admitting: Podiatry

## 2022-05-13 ENCOUNTER — Ambulatory Visit: Payer: Medicare Other | Admitting: Cardiovascular Disease

## 2022-05-13 ENCOUNTER — Ambulatory Visit (INDEPENDENT_AMBULATORY_CARE_PROVIDER_SITE_OTHER): Payer: 59 | Admitting: Podiatry

## 2022-05-13 DIAGNOSIS — E1169 Type 2 diabetes mellitus with other specified complication: Secondary | ICD-10-CM | POA: Diagnosis not present

## 2022-05-13 DIAGNOSIS — T148XXA Other injury of unspecified body region, initial encounter: Secondary | ICD-10-CM | POA: Diagnosis not present

## 2022-05-13 MED ORDER — SANTYL 250 UNIT/GM EX OINT: 1.0000 | TOPICAL_OINTMENT | Freq: Every day | CUTANEOUS | 0 refills | Status: DC

## 2022-05-13 NOTE — Progress Notes (Signed)
Subjective:  Patient ID: Rose [name], female    DOB: 1955-06-12,  MRN: [id number]  Chief Complaint  Patient presents with   Diabetes    Wound check     67 y.o. female presents with the above complaint.  Patient presents for follow-up of right submetatarsal 1 blister with now superficial wound.  She went to get it evaluated discuss next treatment plan.  She has been Santyl wet-to-dry which helped considerably.   Review of Systems: Negative except as noted in the HPI. Denies N/V/F/Ch.  Past Medical History:  Diagnosis Date   ACE inhibitor-aggravated angioedema, initial encounter 04/26/2016   Allergic rhinitis    Anxiety    Arthritis    Calculus of gallbladder without cholecystitis without obstruction 10/28/2018   Chronic back pain    CKD (chronic kidney disease), stage III ([place])    folllowed by pcp   Coronary artery disease    Depression    Diabetic neuropathy ([place])    Edema of lower extremity 10/28/2018   Encounter for completion of form with patient 06/21/2019   Environmental allergies    Environmental allergies 12/28/2015   Fibromyalgia    Foot ulcer secondary to diabetes 06/21/2019   Frequency of urination    History of chronic bronchitis    History of drug abuse ([place]) states quit herion in 2006   History of hepatitis C last abd ultrasound in epic 11-10-2018;  last hepative panel in epic 11-30-2018 was normal   previously followed by [facility] (notes in care everywhere)-- dx 04/ 2015,  started harvonic treatment 07/ 2015   History of rib fracture 10/28/2018   HPV in female 06/11/2016   Hypertension    followed by pcp  (nuclear stress test 01-15-2017 in epic,  showed low risk normal w/ nuclear ef 55%)   Laceration of right foot 03/04/2019   Lipoma of neck 07/16/2017   Seen by general surgery Dr Marlou Starks May 16 - plan for removal of 2cm sebaceous cyst.    Methadone maintenance therapy patient ([place])    Mild asthma    followed by pcp   Nocturia    PAD  (peripheral artery disease) ([place])    followed by cardiology, dr Fletcher Anon---  06-01-2019  s/p bilateral CIA angioplasty stenting   Recurrent falls 04/30/2016   Right foot ulcer ([place])    Symptomatic anemia 11/15/2020   Tendonitis    bilateral wrist   Type 2 diabetes mellitus ([place])    followed by pcp   (06-16-2019  per pt checks blood sugar every other day in am,  fasting sugar 92-95)   Urge urinary incontinence     Current Outpatient Medications:    albuterol (VENTOLIN HFA) 108 (90 Base) MCG/ACT inhaler, Inhale 2 puffs into the lungs every 6 (six) hours as needed. For wheezing, Disp: 1 each, Rfl: 2   atorvastatin (LIPITOR) 40 MG tablet, TAKE 1 TABLET(40 MG) BY MOUTH DAILY, Disp: 90 tablet, Rfl: 6   atorvastatin (LIPITOR) 80 MG tablet, Take 1 tablet (80 mg total) by mouth daily., Disp: 30 tablet, Rfl: 11   carvedilol (COREG) 6.25 MG tablet, Take 1 tablet (6.25 mg total) by mouth 2 (two) times daily., Disp: 60 tablet, Rfl: 11   cetirizine (ZYRTEC) 10 MG tablet, Take 1 tablet (10 mg total) by mouth daily., Disp: 30 tablet, Rfl: 11   clopidogrel (PLAVIX) 75 MG tablet, Take 1 tablet (75 mg total) by mouth daily with breakfast., Disp: 90 tablet, Rfl: 3   DULoxetine (CYMBALTA) 20  MG capsule, Take 3 capsules (60 mg total) by mouth daily., Disp: 90 capsule, Rfl: 5   Empagliflozin-metFORMIN HCl ER 12-998 MG TB24, Take 1 tablet by mouth daily., Disp: 60 tablet, Rfl: 2   fluticasone (FLONASE) 50 MCG/ACT nasal spray, One spray in each nostril daily, Disp: 16 g, Rfl: 5   hydrochlorothiazide (HYDRODIURIL) 25 MG tablet, Take 1 tablet (25 mg total) by mouth daily., Disp: 90 tablet, Rfl: 3   methadone (DOLOPHINE) 10 MG/5ML solution, Take 126 mg by mouth daily. , Disp: , Rfl:    Multiple Vitamins-Minerals (CENTRUM WOMEN) TABS, Take 1 tablet by mouth daily., Disp: , Rfl:    nicotine (NICODERM CQ - DOSED IN MG/24 HOURS) 14 mg/24hr patch, Medicaid [id number] L Wks 1-6: 14 mg x 1 patch daily. Wear for 24 hours. If you  have sleep disturbances, remove at bedtime. (Patient taking differently: Place 14 mg onto the skin See admin instructions. Medicaid [id number] L Wks 1-6: 14 mg x 1 patch daily. Wear for 24 hours. If you have sleep disturbances, remove at bedtime.), Disp: 30 patch, Rfl: 2   Sennosides (SENNA) 8.6 MG CAPS, Take 2 tablets by mouth daily., Disp: 60 capsule, Rfl: 2  Social History   Tobacco Use  Smoking Status Some Days   Packs/day: 0.50   Years: 20.00   Total pack years: 10.00   Types: Cigarettes  Smokeless Tobacco Never  Tobacco Comments   SMOKES 4 CIGS PER DAY    Allergies  Allergen Reactions   Lisinopril Swelling    Angioedema   Gabapentin Hives and Other (See Comments)    Only when takes over 300 mg dose. "gives me the shakes"   Vicodin [Hydrocodone-Acetaminophen] Hives, Diarrhea and Other (See Comments)    Stomach  cramps   Hydrocodone-Acetaminophen    Objective:  There were no vitals filed for this visit. There is no height or weight on file to calculate BMI. Constitutional Well developed. Well nourished.  Vascular Dorsalis pedis pulses palpable bilaterally. Posterior tibial pulses palpable bilaterally. Capillary refill normal to all digits.  No cyanosis or clubbing noted. Pedal hair growth normal.  Neurologic Normal speech. Oriented to person, place, and time. Epicritic sensation to light touch grossly present bilaterally.  Dermatologic Right hallux friction with a superficial wound.  Patient has been applying Betadine wet-to-dry dressing no erythema noted.  No abnormalities noted.  No purulent drainage noted.  Orthopedic: Normal joint ROM without pain or crepitus bilaterally. No visible deformities. No bony tenderness.   Radiographs: None Assessment:   No diagnosis found.  Plan:  Patient was evaluated and treated and all questions answered.  Right submet 1 friction blister -All question and concerns were discussed with the patient in extensive detail.  She is a  diabetic with controlled sugars.   -Continue Santyl wet-to-dry dressing.  Minimal debridement was carried out. -I discussed prevention technique as well.  I will see her back in 5 weeks and discuss further preventative care.  No follow-ups on file.  Wound is about the same.  But improving slowly continue Santyl

## 2022-05-19 ENCOUNTER — Telehealth: Payer: Self-pay | Admitting: *Deleted

## 2022-05-19 NOTE — Telephone Encounter (Signed)
Patient is calling concerning a medication that was supposed to be sent to pharmacy, explained that the medication was sent to [facility] store [phone], verbalized understanding and will call them.

## 2022-05-20 ENCOUNTER — Ambulatory Visit: Payer: 59 | Attending: Cardiovascular Disease | Admitting: Cardiovascular Disease

## 2022-05-20 ENCOUNTER — Encounter: Payer: Self-pay | Admitting: Cardiovascular Disease

## 2022-05-20 VITALS — BP 110/58 | HR 79 | Ht 63.0 in | Wt 142.8 lb

## 2022-05-20 DIAGNOSIS — I10 Essential (primary) hypertension: Secondary | ICD-10-CM | POA: Diagnosis not present

## 2022-05-20 DIAGNOSIS — Z720 Tobacco use: Secondary | ICD-10-CM

## 2022-05-20 DIAGNOSIS — I2510 Atherosclerotic heart disease of native coronary artery without angina pectoris: Secondary | ICD-10-CM

## 2022-05-20 DIAGNOSIS — I739 Peripheral vascular disease, unspecified: Secondary | ICD-10-CM

## 2022-05-20 DIAGNOSIS — E785 Hyperlipidemia, unspecified: Secondary | ICD-10-CM

## 2022-05-20 LAB — CBC

## 2022-05-20 NOTE — H&P (View-Only) (Signed)
Cardiology Office Note   Date:  05/20/2022   ID:  Garnett, Goertz Aug 02, 1955, MRN [id number]  PCP:  Iona Coach, MD  Cardiologist:   [name], MD   No chief complaint on file.      History of Present Illness: Rose [name] is a 67 y.o. female who is here today for follow-up visit regarding peripheral arterial disease and coronary artery disease.  The patient has multiple chronic medical conditions that include type 2 diabetes, essential hypertension, hyperlipidemia, remote IV drug use and tobacco use. She is status post bilateral common iliac artery stenting done in March 2021.  She had no significant infrainguinal disease at that time.  She had an ulceration on the right lateral foot at that time.   She was seen in September of 2021 for exertional chest pain.  She underwent a Lexiscan Myoview which showed evidence of anterior and anterolateral ischemia.  Cardiac catheterization in October showed severe one-vessel coronary artery disease with 90% heavily calcified stenosis in the mid LAD and moderate left circumflex disease.  Ejection fraction was normal with mildly elevated left ventricular end-diastolic pressure.  I performed successful intravascular lithotripsy and drug eluting stent placement to the mid LAD.    She was able to quit smoking at some point but she relapsed after the death of her sister.   She had a recent injury to the bottom of the right foot and developed a blister over the first right metatarsal area.  This progressed to a full wound that has been there for a month.  No significant pain and no evidence of infection.  She has been seen by podiatry. No chest pain or shortness of breath.  She continues to smoke 5 to 6 cigarettes a day.   Past Medical History:  Diagnosis Date   ACE inhibitor-aggravated angioedema, initial encounter 04/26/2016   Allergic rhinitis    Anxiety    Arthritis    Calculus of gallbladder without cholecystitis  without obstruction 10/28/2018   Chronic back pain    CKD (chronic kidney disease), stage III ([place])    folllowed by pcp   Coronary artery disease    Depression    Diabetic neuropathy ([place])    Edema of lower extremity 10/28/2018   Encounter for completion of form with patient 06/21/2019   Environmental allergies    Environmental allergies 12/28/2015   Fibromyalgia    Foot ulcer secondary to diabetes 06/21/2019   Frequency of urination    History of chronic bronchitis    History of drug abuse ([place]) states quit herion in 2006   History of hepatitis C last abd ultrasound in epic 11-10-2018;  last hepative panel in epic 11-30-2018 was normal   previously followed by [facility] (notes in care everywhere)-- dx 04/ 2015,  started harvonic treatment 07/ 2015   History of rib fracture 10/28/2018   HPV in female 06/11/2016   Hypertension    followed by pcp  (nuclear stress test 01-15-2017 in epic,  showed low risk normal w/ nuclear ef 55%)   Laceration of right foot 03/04/2019   Lipoma of neck 07/16/2017   Seen by general surgery Dr Marlou Starks May 16 - plan for removal of 2cm sebaceous cyst.    Methadone maintenance therapy patient ([place])    Mild asthma    followed by pcp   Nocturia    PAD (peripheral artery disease) ([place])    followed by cardiology, dr Fletcher Anon---  06-01-2019  s/p bilateral  CIA angioplasty stenting   Recurrent falls 04/30/2016   Right foot ulcer ([facility])    Symptomatic anemia 11/15/2020   Tendonitis    bilateral wrist   Type 2 diabetes mellitus ([place])    followed by pcp   (06-16-2019  per pt checks blood sugar every other day in am,  fasting sugar 92-95)   Urge urinary incontinence     Past Surgical History:  Procedure Laterality Date   ABDOMINAL AORTOGRAM W/LOWER EXTREMITY Bilateral 06/01/2019   Procedure: ABDOMINAL AORTOGRAM W/LOWER EXTREMITY;  Surgeon: Wellington Hampshire, MD;  Location: [place] [facility];  Service: Cardiovascular;  Laterality: Bilateral;   APPENDECTOMY   1972   INTERSTIM IMPLANT PLACEMENT  10/23/2011   Procedure: Barrie Lyme IMPLANT FIRST STAGE;  Surgeon: Reece Packer, MD;  Location: [facility];  Service: Urology;  Laterality: N/A;   INTERSTIM IMPLANT PLACEMENT  10/23/2011   Procedure: Barrie Lyme IMPLANT SECOND STAGE;  Surgeon: Reece Packer, MD;  Location: [facility];  Service: Urology;  Laterality: N/A;   LEFT HEART CATH AND CORONARY ANGIOGRAPHY N/A 01/11/2020   Procedure: LEFT HEART CATH AND CORONARY ANGIOGRAPHY;  Surgeon: Wellington Hampshire, MD;  Location: [place] [facility];  Service: Cardiovascular;  Laterality: N/A;   PANACOS TISSUE GRAFT Right 06/24/2019   Procedure: Application of Skin Graft Substitute;  Surgeon: Evelina Bucy, DPM;  Location: [place];  Service: Podiatry;  Laterality: Right;   PERIPHERAL VASCULAR INTERVENTION Bilateral 06/01/2019   Procedure: PERIPHERAL VASCULAR INTERVENTION;  Surgeon: Wellington Hampshire, MD;  Location: [place] [facility];  Service: Cardiovascular;  Laterality: Bilateral;   WOUND DEBRIDEMENT Right 06/24/2019   Procedure: DEBRIDEMENT WOUND;  Surgeon: Evelina Bucy, DPM;  Location: [place];  Service: Podiatry;  Laterality: Right;   WOUND DEBRIDEMENT Right 08/24/2019   Procedure: DEBRIDEMENT RIGHT FOOT WOUND; APPLICATION OF SKIN GRAFT SUBSTITUTE;  Surgeon: Evelina Bucy, DPM;  Location: [place];  Service: Podiatry;  Laterality: Right;     Current Outpatient Medications  Medication Sig Dispense Refill   albuterol (VENTOLIN HFA) 108 (90 Base) MCG/ACT inhaler Inhale 2 puffs into the lungs every 6 (six) hours as needed. For wheezing 1 each 2   atorvastatin (LIPITOR) 80 MG tablet Take 1 tablet (80 mg total) by mouth daily. 30 tablet 11   carvedilol (COREG) 6.25 MG tablet Take 1 tablet (6.25 mg total) by mouth 2 (two) times daily. 60 tablet 11   cetirizine (ZYRTEC) 10 MG tablet Take 1 tablet (10 mg total) by mouth  daily. 30 tablet 11   clopidogrel (PLAVIX) 75 MG tablet Take 1 tablet (75 mg total) by mouth daily with breakfast. 90 tablet 3   collagenase (SANTYL) 250 UNIT/GM ointment Apply 1 Application topically daily. 15 g 0   DULoxetine (CYMBALTA) 20 MG capsule Take 3 capsules (60 mg total) by mouth daily. 90 capsule 5   Empagliflozin-metFORMIN HCl ER 12-998 MG TB24 Take 1 tablet by mouth daily. 60 tablet 2   fluticasone (FLONASE) 50 MCG/ACT nasal spray One spray in each nostril daily 16 g 5   hydrochlorothiazide (HYDRODIURIL) 25 MG tablet Take 1 tablet (25 mg total) by mouth daily. 90 tablet 3   methadone (DOLOPHINE) 10 MG/5ML solution Take 126 mg by mouth daily.      Multiple Vitamins-Minerals (CENTRUM WOMEN) TABS Take 1 tablet by mouth daily.     nicotine (NICODERM CQ - DOSED IN MG/24 HOURS) 14 mg/24hr patch Medicaid [id number] L Wks 1-6:  14 mg x 1 patch daily. Wear for 24 hours. If you have sleep disturbances, remove at bedtime. (Patient taking differently: Place 14 mg onto the skin See admin instructions. Medicaid [id number] L Wks 1-6: 14 mg x 1 patch daily. Wear for 24 hours. If you have sleep disturbances, remove at bedtime.) 30 patch 2   Sennosides (SENNA) 8.6 MG CAPS Take 2 tablets by mouth daily. 60 capsule 2   No current facility-administered medications for this visit.    Allergies:   Lisinopril, Gabapentin, Vicodin [hydrocodone-acetaminophen], and Hydrocodone-acetaminophen    Social History:  The patient  reports that she has been smoking cigarettes. She has a 10.00 pack-year smoking history. She has never used smokeless tobacco. She reports that she does not currently use drugs. She reports that she does not drink alcohol.   Family History:  The patient's family history includes CAD in her father; Diabetes type II in her brother, mother, and sister; Heart attack (age of onset: 3) in her brother; Heart attack (age of onset: 29) in her father and mother; Hypertension in her brother and mother.     ROS:  Please see the history of present illness.   Otherwise, review of systems are positive for none.   All other systems are reviewed and negative.    PHYSICAL EXAM: VS:  BP (!) 110/58 (BP Location: Right Arm, Patient Position: Sitting, Cuff Size: Normal)   Pulse 79   Ht '5\' 3"'$  (1.6 m)   Wt 142 lb 12.8 oz (64.8 kg)   SpO2 97%   BMI 25.30 kg/m  , BMI Body mass index is 25.3 kg/m. GEN: Well nourished, well developed, in no acute distress  HEENT: normal  Neck: no JVD, carotid bruits, or masses Cardiac: RRR; no murmurs, rubs, or gallops, mild bilateral leg edema Respiratory:  clear to auscultation bilaterally, normal work of breathing GI: soft, nontender, nondistended, + BS MS: no deformity or atrophy  Skin: warm and dry, no rash Neuro:  Strength and sensation are intact Psych: euthymic mood, full affect Vascular: She has a superficial wound at the plantar aspect of the right foot at the base of first and second toes. Right posterior tibial pulse was previously palpable but could not be felt today.  Dorsalis pedis is not palpable either.   EKG:  EKG is ordered today. Normal sinus rhythm with old septal infarct.  No acute changes.  Recent Labs: 12/10/2021: BUN 20; Creatinine, Ser 1.53; Potassium 3.9; Sodium 137    Lipid Panel    Component Value Date/Time   CHOL 98 (L) 12/10/2021 1115   TRIG 160 (H) 12/10/2021 1115   HDL 29 (L) 12/10/2021 1115   CHOLHDL 3.4 12/10/2021 1115   LDLCALC 42 12/10/2021 1115      Wt Readings from Last 3 Encounters:  05/20/22 142 lb 12.8 oz (64.8 kg)  04/23/22 144 lb 6.4 oz (65.5 kg)  03/26/22 141 lb 12.8 oz (64.3 kg)           12/19/2016    1:37 PM  PAD Screen  Previous PAD dx? No  Previous surgical procedure? No  Pain with walking? No  Feet/toe relief with dangling? Yes  Painful, non-healing ulcers? No  Extremities discolored? No      ASSESSMENT AND PLAN:  1.  Peripheral arterial disease: Status post  stent placement to  bilateral common iliac arteries.  She now presents with nonhealing wound on the plantar side of the right foot.  Distal pulses are currently not palpable and I am  concerned about poor circulation affecting the slow healing.  Thus, I recommend proceeding with abdominal aortogram with lower extremity angiography and possible endovascular intervention.  I discussed the procedure in details as well as risk and benefits.  Planned access is via the left common femoral artery.  The stents in the common iliac arteries did not extend to the aorta and thus it should be possible to do it from the contralateral side.  2. Coronary artery disease involving native coronary arteries without angina : She is doing well with no anginal symptoms.  Continue medical therapy.  She is on long-term dual antiplatelet therapy due to her combined coronary and peripheral arterial disease.  3.  Tobacco use: I again discussed with him the importance of smoking cessation.  4.  Hyperlipidemia: Continue treatment with atorvastatin.  I reviewed most recent lipid profile done in September which showed an LDL of 42.  5.  Essential hypertension: Blood pressure is controlled.   Disposition:   Proceed with an angiogram next week and follow-up after.  Signed,  [name], MD  05/20/2022 10:47 AM    [place]

## 2022-05-20 NOTE — Patient Instructions (Signed)
Medication Instructions:  No changes *If you need a refill on your cardiac medications before your next appointment, please call your pharmacy*  Testing/Procedures: Your physician has requested that you have a peripheral vascular angiogram. This exam is performed at the hospital. During this exam IV contrast is used to look at arterial blood flow. Please review the information sheet given for details.  Follow-Up: At [facility], you and your health needs are our priority.  As part of our continuing mission to provide you with exceptional heart care, we have created designated Provider Care Teams.  These Care Teams include your primary Cardiologist (physician) and Advanced Practice Providers (APPs -  Physician Assistants and Nurse Practitioners) who all work together to provide you with the care you need, when you need it.  We recommend signing up for the patient portal called "MyChart".  Sign up information is provided on this After Visit Summary.  MyChart is used to connect with patients for Virtual Visits (Telemedicine).  Patients are able to view lab/test results, encounter notes, upcoming appointments, etc.  Non-urgent messages can be sent to your provider as well.   To learn more about what you can do with MyChart, go to [email or web address].    Your next appointment:   Keep your post procedure follow up with Dr. Fletcher Anon on 3/19 at 11am Other Instructions       Cardiac/Peripheral Catheterization   You are scheduled for a Peripheral Angiogram on Wednesday, February 28 with Dr. [name].  1. Please arrive at the Main Entrance A at [facility]: [place], [place] [address] on February 28 at 10:30 AM (This time is two hours before your procedure to ensure your preparation). Free valet parking service is available. You will check in at ADMITTING. The support person will be asked to wait in the waiting room.  It is OK to have someone drop you off and come  back when you are ready to be discharged.        Special note: Every effort is made to have your procedure done on time. Please understand that emergencies sometimes delay scheduled procedures.   . 2. Diet: Do not eat solid foods after midnight.  You may have clear liquids until 5 AM the day of the procedure.  3. Labs: You will need to have blood drawn on 2/20. You do not need to be fasting.  4. Medication instructions in preparation for your procedure: Hold the Hydrochlorothiazide Hold all diabetic medication Hold the Empagliflozin-Metformin the morning of the procedure and then 48 hours after.   On the morning of your procedure, take Plavix/Clopidogrel and any morning medicines NOT listed above.  You may use sips of water.  5. Plan to go home the same day, you will only stay overnight if medically necessary. 6. You MUST have a responsible adult to drive you home. 7. An adult MUST be with you the first 24 hours after you arrive home. 8. Bring a current list of your medications, and the last time and date medication taken. 9. Bring ID and current insurance cards. 10.Please wear clothes that are easy to get on and off and wear slip-on shoes.  Thank you for allowing [place] to care for you!   -- [place] [facility]

## 2022-05-20 NOTE — Progress Notes (Signed)
Cardiology Office Note   Date:  05/20/2022   ID:  Meirav, Sas [birth year], MRN [id number]  PCP:  Iona Coach, MD  Cardiologist:   [name], MD   No chief complaint on file.      History of Present Illness: Rose [name] is a 67 y.o. female who is here today for follow-up visit regarding peripheral arterial disease and coronary artery disease.  The patient has multiple chronic medical conditions that include type 2 diabetes, essential hypertension, hyperlipidemia, remote IV drug use and tobacco use. She is status post bilateral common iliac artery stenting done in March 2021.  She had no significant infrainguinal disease at that time.  She had an ulceration on the right lateral foot at that time.   She was seen in September of 2021 for exertional chest pain.  She underwent a Lexiscan Myoview which showed evidence of anterior and anterolateral ischemia.  Cardiac catheterization in October showed severe one-vessel coronary artery disease with 90% heavily calcified stenosis in the mid LAD and moderate left circumflex disease.  Ejection fraction was normal with mildly elevated left ventricular end-diastolic pressure.  I performed successful intravascular lithotripsy and drug eluting stent placement to the mid LAD.    She was able to quit smoking at some point but she relapsed after the death of her sister.   She had a recent injury to the bottom of the right foot and developed a blister over the first right metatarsal area.  This progressed to a full wound that has been there for a month.  No significant pain and no evidence of infection.  She has been seen by podiatry. No chest pain or shortness of breath.  She continues to smoke 5 to 6 cigarettes a day.   Past Medical History:  Diagnosis Date   ACE inhibitor-aggravated angioedema, initial encounter 04/26/2016   Allergic rhinitis    Anxiety    Arthritis    Calculus of gallbladder without cholecystitis  without obstruction 10/28/2018   Chronic back pain    CKD (chronic kidney disease), stage III ([place])    folllowed by pcp   Coronary artery disease    Depression    Diabetic neuropathy ([place])    Edema of lower extremity 10/28/2018   Encounter for completion of form with patient 06/21/2019   Environmental allergies    Environmental allergies 12/28/2015   Fibromyalgia    Foot ulcer secondary to diabetes 06/21/2019   Frequency of urination    History of chronic bronchitis    History of drug abuse ([place]) states quit herion in 2006   History of hepatitis C last abd ultrasound in epic 11-10-2018;  last hepative panel in epic 11-30-2018 was normal   previously followed by [facility] (notes in care everywhere)-- dx 04/ 2015,  started harvonic treatment 07/ 2015   History of rib fracture 10/28/2018   HPV in female 06/11/2016   Hypertension    followed by pcp  (nuclear stress test 01-15-2017 in epic,  showed low risk normal w/ nuclear ef 55%)   Laceration of right foot 03/04/2019   Lipoma of neck 07/16/2017   Seen by general surgery Dr Marlou Starks May 16 - plan for removal of 2cm sebaceous cyst.    Methadone maintenance therapy patient ([place])    Mild asthma    followed by pcp   Nocturia    PAD (peripheral artery disease) ([place])    followed by cardiology, dr Fletcher Anon---  06-01-2019  s/p bilateral  CIA angioplasty stenting   Recurrent falls 04/30/2016   Right foot ulcer (HCC)    Symptomatic anemia 11/15/2020   Tendonitis    bilateral wrist   Type 2 diabetes mellitus ([place])    followed by pcp   (06-16-2019  per pt checks blood sugar every other day in am,  fasting sugar 92-95)   Urge urinary incontinence     Past Surgical History:  Procedure Laterality Date   ABDOMINAL AORTOGRAM W/LOWER EXTREMITY Bilateral 06/01/2019   Procedure: ABDOMINAL AORTOGRAM W/LOWER EXTREMITY;  Surgeon: Wellington Hampshire, MD;  Location: [place] [facility];  Service: Cardiovascular;  Laterality: Bilateral;   APPENDECTOMY   1972   INTERSTIM IMPLANT PLACEMENT  10/23/2011   Procedure: Barrie Lyme IMPLANT FIRST STAGE;  Surgeon: Reece Packer, MD;  Location: [facility];  Service: Urology;  Laterality: N/A;   INTERSTIM IMPLANT PLACEMENT  10/23/2011   Procedure: Barrie Lyme IMPLANT SECOND STAGE;  Surgeon: Reece Packer, MD;  Location: [facility];  Service: Urology;  Laterality: N/A;   LEFT HEART CATH AND CORONARY ANGIOGRAPHY N/A 01/11/2020   Procedure: LEFT HEART CATH AND CORONARY ANGIOGRAPHY;  Surgeon: Wellington Hampshire, MD;  Location: [place] [facility];  Service: Cardiovascular;  Laterality: N/A;   PANACOS TISSUE GRAFT Right 06/24/2019   Procedure: Application of Skin Graft Substitute;  Surgeon: Evelina Bucy, DPM;  Location: [place];  Service: Podiatry;  Laterality: Right;   PERIPHERAL VASCULAR INTERVENTION Bilateral 06/01/2019   Procedure: PERIPHERAL VASCULAR INTERVENTION;  Surgeon: Wellington Hampshire, MD;  Location: [place] [facility];  Service: Cardiovascular;  Laterality: Bilateral;   WOUND DEBRIDEMENT Right 06/24/2019   Procedure: DEBRIDEMENT WOUND;  Surgeon: Evelina Bucy, DPM;  Location: [place];  Service: Podiatry;  Laterality: Right;   WOUND DEBRIDEMENT Right 08/24/2019   Procedure: DEBRIDEMENT RIGHT FOOT WOUND; APPLICATION OF SKIN GRAFT SUBSTITUTE;  Surgeon: Evelina Bucy, DPM;  Location: [place];  Service: Podiatry;  Laterality: Right;     Current Outpatient Medications  Medication Sig Dispense Refill   albuterol (VENTOLIN HFA) 108 (90 Base) MCG/ACT inhaler Inhale 2 puffs into the lungs every 6 (six) hours as needed. For wheezing 1 each 2   atorvastatin (LIPITOR) 80 MG tablet Take 1 tablet (80 mg total) by mouth daily. 30 tablet 11   carvedilol (COREG) 6.25 MG tablet Take 1 tablet (6.25 mg total) by mouth 2 (two) times daily. 60 tablet 11   cetirizine (ZYRTEC) 10 MG tablet Take 1 tablet (10 mg total) by mouth  daily. 30 tablet 11   clopidogrel (PLAVIX) 75 MG tablet Take 1 tablet (75 mg total) by mouth daily with breakfast. 90 tablet 3   collagenase (SANTYL) 250 UNIT/GM ointment Apply 1 Application topically daily. 15 g 0   DULoxetine (CYMBALTA) 20 MG capsule Take 3 capsules (60 mg total) by mouth daily. 90 capsule 5   Empagliflozin-metFORMIN HCl ER 12-998 MG TB24 Take 1 tablet by mouth daily. 60 tablet 2   fluticasone (FLONASE) 50 MCG/ACT nasal spray One spray in each nostril daily 16 g 5   hydrochlorothiazide (HYDRODIURIL) 25 MG tablet Take 1 tablet (25 mg total) by mouth daily. 90 tablet 3   methadone (DOLOPHINE) 10 MG/5ML solution Take 126 mg by mouth daily.      Multiple Vitamins-Minerals (CENTRUM WOMEN) TABS Take 1 tablet by mouth daily.     nicotine (NICODERM CQ - DOSED IN MG/24 HOURS) 14 mg/24hr patch Medicaid [id number] L Wks 1-6:  14 mg x 1 patch daily. Wear for 24 hours. If you have sleep disturbances, remove at bedtime. (Patient taking differently: Place 14 mg onto the skin See admin instructions. Medicaid [id number] L Wks 1-6: 14 mg x 1 patch daily. Wear for 24 hours. If you have sleep disturbances, remove at bedtime.) 30 patch 2   Sennosides (SENNA) 8.6 MG CAPS Take 2 tablets by mouth daily. 60 capsule 2   No current facility-administered medications for this visit.    Allergies:   Lisinopril, Gabapentin, Vicodin [hydrocodone-acetaminophen], and Hydrocodone-acetaminophen    Social History:  The patient  reports that she has been smoking cigarettes. She has a 10.00 pack-year smoking history. She has never used smokeless tobacco. She reports that she does not currently use drugs. She reports that she does not drink alcohol.   Family History:  The patient's family history includes CAD in her father; Diabetes type II in her brother, mother, and sister; Heart attack (age of onset: 65) in her brother; Heart attack (age of onset: 43) in her father and mother; Hypertension in her brother and mother.     ROS:  Please see the history of present illness.   Otherwise, review of systems are positive for none.   All other systems are reviewed and negative.    PHYSICAL EXAM: VS:  BP (!) 110/58 (BP Location: Right Arm, Patient Position: Sitting, Cuff Size: Normal)   Pulse 79   Ht 5' 3"$  (1.6 m)   Wt 142 lb 12.8 oz (64.8 kg)   SpO2 97%   BMI 25.30 kg/m  , BMI Body mass index is 25.3 kg/m. GEN: Well nourished, well developed, in no acute distress  HEENT: normal  Neck: no JVD, carotid bruits, or masses Cardiac: RRR; no murmurs, rubs, or gallops, mild bilateral leg edema Respiratory:  clear to auscultation bilaterally, normal work of breathing GI: soft, nontender, nondistended, + BS MS: no deformity or atrophy  Skin: warm and dry, no rash Neuro:  Strength and sensation are intact Psych: euthymic mood, full affect Vascular: She has a superficial wound at the plantar aspect of the right foot at the base of first and second toes. Right posterior tibial pulse was previously palpable but could not be felt today.  Dorsalis pedis is not palpable either.   EKG:  EKG is ordered today. Normal sinus rhythm with old septal infarct.  No acute changes.  Recent Labs: 12/10/2021: BUN 20; Creatinine, Ser 1.53; Potassium 3.9; Sodium 137    Lipid Panel    Component Value Date/Time   CHOL 98 (L) 12/10/2021 1115   TRIG 160 (H) 12/10/2021 1115   HDL 29 (L) 12/10/2021 1115   CHOLHDL 3.4 12/10/2021 1115   LDLCALC 42 12/10/2021 1115      Wt Readings from Last 3 Encounters:  05/20/22 142 lb 12.8 oz (64.8 kg)  04/23/22 144 lb 6.4 oz (65.5 kg)  03/26/22 141 lb 12.8 oz (64.3 kg)           12/19/2016    1:37 PM  PAD Screen  Previous PAD dx? No  Previous surgical procedure? No  Pain with walking? No  Feet/toe relief with dangling? Yes  Painful, non-healing ulcers? No  Extremities discolored? No      ASSESSMENT AND PLAN:  1.  Peripheral arterial disease: Status post  stent placement to  bilateral common iliac arteries.  She now presents with nonhealing wound on the plantar side of the right foot.  Distal pulses are currently not palpable and I am  concerned about poor circulation affecting the slow healing.  Thus, I recommend proceeding with abdominal aortogram with lower extremity angiography and possible endovascular intervention.  I discussed the procedure in details as well as risk and benefits.  Planned access is via the left common femoral artery.  The stents in the common iliac arteries did not extend to the aorta and thus it should be possible to do it from the contralateral side.  2. Coronary artery disease involving native coronary arteries without angina : She is doing well with no anginal symptoms.  Continue medical therapy.  She is on long-term dual antiplatelet therapy due to her combined coronary and peripheral arterial disease.  3.  Tobacco use: I again discussed with him the importance of smoking cessation.  4.  Hyperlipidemia: Continue treatment with atorvastatin.  I reviewed most recent lipid profile done in September which showed an LDL of 42.  5.  Essential hypertension: Blood pressure is controlled.   Disposition:   Proceed with an angiogram next week and follow-up after.  Signed,  [name], MD  05/20/2022 10:47 AM    [place]

## 2022-05-21 LAB — BASIC METABOLIC PANEL
BUN/Creatinine Ratio: 11 — ABNORMAL LOW (ref 12–28)
BUN: 17 mg/dL (ref 8–27)
CO2: 28 mmol/L (ref 20–29)
Calcium: 9.7 mg/dL (ref 8.7–10.3)
Chloride: 96 mmol/L (ref 96–106)
Creatinine, Ser: 1.51 mg/dL — ABNORMAL HIGH (ref 0.57–1.00)
Glucose: 125 mg/dL — ABNORMAL HIGH (ref 70–99)
Potassium: 3.7 mmol/L (ref 3.5–5.2)
Sodium: 139 mmol/L (ref 134–144)
eGFR: 38 mL/min/{1.73_m2} — ABNORMAL LOW (ref >59)

## 2022-05-21 LAB — CBC
Hematocrit: 35.4 % (ref 34.0–46.6)
Hemoglobin: 11.7 g/dL (ref 11.1–15.9)
MCH: 26.6 pg (ref 26.6–33.0)
MCHC: 33.1 g/dL (ref 31.5–35.7)
MCV: 81 fL (ref 79–97)
Platelets: 278 10*3/uL (ref 150–450)
RBC: 4.4 x10E6/uL (ref 3.77–5.28)
RDW: 11.9 % (ref 11.7–15.4)
WBC: 8.7 10*3/uL (ref 3.4–10.8)

## 2022-05-26 ENCOUNTER — Telehealth: Payer: Self-pay | Admitting: *Deleted

## 2022-05-26 ENCOUNTER — Other Ambulatory Visit: Payer: Self-pay | Admitting: Student

## 2022-05-26 DIAGNOSIS — G8929 Other chronic pain: Secondary | ICD-10-CM

## 2022-05-26 DIAGNOSIS — E1142 Type 2 diabetes mellitus with diabetic polyneuropathy: Secondary | ICD-10-CM

## 2022-05-26 NOTE — Telephone Encounter (Addendum)
Abdominal Aortogram scheduled at [facility] for: Wednesday May 28, 2022 12:30 PM Arrival time and place: [place] Entrance A at: 7:30 AM-pre-procedure hydration  Nothing to eat after midnight prior to procedure, clear liquids until 5 AM day of procedure.  Medication instructions: -Hold HCTZ-day before and day of procedure-per protocol GFR 38 Empagliflozin-Metformin-day of procedure and 48 hours post procedure -Other usual morning medications can be taken with sips of water including aspirin 81 mg and Plavix 75 mg.   Confirmed patient has responsible adult to drive home post procedure and be with patient first 24 hours after arriving home.  Patient reports no new symptoms concerning for COVID-19 in the past 10 days.  Reviewed procedure instructions/pre-procedure hydration with patient.

## 2022-05-29 NOTE — Telephone Encounter (Signed)
Per Cath Lab RN-pt called morning of 05/28/22 stated she had diarrhea, rescheduled abdominal aortogram to 06/04/22.

## 2022-06-02 ENCOUNTER — Ambulatory Visit (INDEPENDENT_AMBULATORY_CARE_PROVIDER_SITE_OTHER): Payer: 59 | Admitting: Podiatry

## 2022-06-02 ENCOUNTER — Encounter: Payer: Self-pay | Admitting: Podiatry

## 2022-06-02 DIAGNOSIS — M79609 Pain in unspecified limb: Secondary | ICD-10-CM

## 2022-06-02 DIAGNOSIS — B351 Tinea unguium: Secondary | ICD-10-CM

## 2022-06-02 DIAGNOSIS — E1151 Type 2 diabetes mellitus with diabetic peripheral angiopathy without gangrene: Secondary | ICD-10-CM

## 2022-06-02 NOTE — Progress Notes (Signed)
  Subjective:  Patient ID: Rose [name], female    DOB: 1955/04/28,  MRN: [id number]  Rose [name] presents to clinic today for at risk foot care. Pt has h/o NIDDM with PAD and painful thick toenails that are difficult to trim. Pain interferes with ambulation. Aggravating factors include wearing enclosed shoe gear. Pain is relieved with periodic professional debridement.  Chief Complaint  Patient presents with   Nail Problem    [facility] BS-97 A1C-6.7 PCP-Rose [name] PCP VST-03/2022   Patient has diabetic foot ulceration plantar aspect right foot which is being managed by Dr. Boneta Lucks. She is also being followed by Vascular team, Dr. [name] at [facility]. She is being worked up for PAD.  PCP is Rose Coach, MD.  Allergies  Allergen Reactions   Lisinopril Swelling    Angioedema   Gabapentin Hives and Other (See Comments)    Only when takes over 300 mg dose. "gives me the shakes"   Vicodin [Hydrocodone-Acetaminophen] Hives, Diarrhea and Other (See Comments)    Stomach  cramps   Hydrocodone-Acetaminophen    Review of Systems: Negative except as noted in the HPI.  Objective: No changes noted in today's physical examination. There were no vitals filed for this visit.  Rose [name] is a pleasant 67 y.o. female WD, WN in NAD. AAO x 3.  Vascular Examination: CFT <4 seconds b/l. DP pulses diminished b/l. PT pulses diminished b/l. Digital hair absent. Skin temperature gradient warm to cool b/l. No ischemia or gangrene. No cyanosis or clubbing noted b/l. No ischemia or gangrene noted b/l LE.   Neurological Examination: Sensation grossly intact b/l with 10 gram monofilament. Vibratory sensation intact b/l.   Dermatological Examination: Pedal skin thin, shiny and atrophic b/l. No interdigital macerations.   Toenails 1-5 b/l thick, discolored, elongated with subungual debris and pain on dorsal palpation.   Dressing right foot which is  clean, dry and intact. No odor, no visible drainage.  Musculoskeletal Examination: Muscle strength 5/5 to b/l LE.  Wearing [place] shoe right foot. Utilizes cane for ambulation assistance.  Radiographs: None  Last A1c:      Latest Ref Rng & Units 03/26/2022    9:17 AM 10/14/2021    9:52 AM 07/01/2021   11:02 AM  Hemoglobin A1C  Hemoglobin-A1c 4.0 - 5.6 % 6.8  6.2  6.3    Assessment/Plan: 1. Pain due to onychomycosis of nail   2. Type II diabetes mellitus with peripheral circulatory disorder [facility])     -Patient was evaluated and treated. All patient's and/or POA's questions/concerns answered on today's visit. -Patient currently under care of right foot ulcer with Dr. Boneta Lucks. Also being seen by Dr. [name] for PAD. -Continue foot and shoe inspections daily. Monitor blood glucose per PCP/Endocrinologist's recommendations. -Mycotic toenails 1-5 bilaterally were debrided in length and girth with sterile nail nippers and dremel without incident. -Patient/POA to call should there be question/concern in the interim.   Return in about 3 months (around 09/02/2022).  Marzetta Board, DPM

## 2022-06-03 ENCOUNTER — Other Ambulatory Visit: Payer: Self-pay | Admitting: Student

## 2022-06-03 ENCOUNTER — Telehealth: Payer: Self-pay | Admitting: *Deleted

## 2022-06-03 NOTE — Telephone Encounter (Signed)
Abdominal aortogram scheduled at [facility] for: Wednesday June 04, 2022 10:30 AM Arrival time and place: [place] Entrance A at: 5:30 AM-pre-procedure hydration  Nothing to eat after midnight prior to procedure, clear liquids until 5 AM day of procedure.  Medication instructions: -Hold:  Empagliflozin-Metformin-day of procedure and 48 hours post procedure  HCTZ-AM of procedure  -Other usual morning medications can be taken with sips of water including aspirin 81 mg and Plavix 75 mg  Confirmed patient has responsible adult to drive home post procedure and be with patient first 24 hours after arriving home.  Reviewed procedure instructions with patient.

## 2022-06-04 ENCOUNTER — Encounter (HOSPITAL_COMMUNITY): Payer: Self-pay | Admitting: Cardiovascular Disease

## 2022-06-04 ENCOUNTER — Ambulatory Visit (HOSPITAL_COMMUNITY)
Admission: RE | Admit: 2022-06-04 | Discharge: 2022-06-04 | Disposition: A | Discharge status: Home / Self Care | Payer: 59 | Source: Ambulatory Visit | Attending: Cardiovascular Disease | Admitting: Cardiovascular Disease

## 2022-06-04 ENCOUNTER — Encounter (HOSPITAL_COMMUNITY)
Admission: RE | Disposition: A | Discharge status: Home / Self Care | Payer: Self-pay | Source: Ambulatory Visit | Attending: Cardiovascular Disease

## 2022-06-04 DIAGNOSIS — I2510 Atherosclerotic heart disease of native coronary artery without angina pectoris: Secondary | ICD-10-CM | POA: Insufficient documentation

## 2022-06-04 DIAGNOSIS — L97519 Non-pressure chronic ulcer of other part of right foot with unspecified severity: Secondary | ICD-10-CM | POA: Insufficient documentation

## 2022-06-04 DIAGNOSIS — I70235 Atherosclerosis of native arteries of right leg with ulceration of other part of foot: Secondary | ICD-10-CM | POA: Diagnosis not present

## 2022-06-04 DIAGNOSIS — E1122 Type 2 diabetes mellitus with diabetic chronic kidney disease: Secondary | ICD-10-CM | POA: Insufficient documentation

## 2022-06-04 DIAGNOSIS — Z7984 Long term (current) use of oral hypoglycemic drugs: Secondary | ICD-10-CM | POA: Insufficient documentation

## 2022-06-04 DIAGNOSIS — Z955 Presence of coronary angioplasty implant and graft: Secondary | ICD-10-CM | POA: Insufficient documentation

## 2022-06-04 DIAGNOSIS — I129 Hypertensive chronic kidney disease with stage 1 through stage 4 chronic kidney disease, or unspecified chronic kidney disease: Secondary | ICD-10-CM | POA: Insufficient documentation

## 2022-06-04 DIAGNOSIS — Z79899 Other long term (current) drug therapy: Secondary | ICD-10-CM | POA: Diagnosis not present

## 2022-06-04 DIAGNOSIS — E1151 Type 2 diabetes mellitus with diabetic peripheral angiopathy without gangrene: Secondary | ICD-10-CM | POA: Diagnosis not present

## 2022-06-04 DIAGNOSIS — I739 Peripheral vascular disease, unspecified: Secondary | ICD-10-CM

## 2022-06-04 DIAGNOSIS — E785 Hyperlipidemia, unspecified: Secondary | ICD-10-CM | POA: Diagnosis not present

## 2022-06-04 DIAGNOSIS — F17210 Nicotine dependence, cigarettes, uncomplicated: Secondary | ICD-10-CM | POA: Insufficient documentation

## 2022-06-04 DIAGNOSIS — N183 Chronic kidney disease, stage 3 unspecified: Secondary | ICD-10-CM | POA: Diagnosis not present

## 2022-06-04 DIAGNOSIS — I70234 Atherosclerosis of native arteries of right leg with ulceration of heel and midfoot: Secondary | ICD-10-CM | POA: Diagnosis not present

## 2022-06-04 DIAGNOSIS — E11621 Type 2 diabetes mellitus with foot ulcer: Secondary | ICD-10-CM | POA: Insufficient documentation

## 2022-06-04 HISTORY — PX: ABDOMINAL AORTOGRAM W/LOWER EXTREMITY: CATH118223

## 2022-06-04 LAB — GLUCOSE, CAPILLARY: Glucose-Capillary: 92 mg/dL (ref 70–99)

## 2022-06-04 SURGERY — ABDOMINAL AORTOGRAM W/LOWER EXTREMITY
Anesthesia: LOCAL

## 2022-06-04 MED ORDER — FENTANYL CITRATE (PF) 100 MCG/2ML IJ SOLN
INTRAMUSCULAR | Status: AC
  Filled 2022-06-04: qty 2

## 2022-06-04 MED ORDER — ASPIRIN 81 MG PO CHEW: 81.0000 mg | CHEWABLE_TABLET | ORAL | Status: DC

## 2022-06-04 MED ORDER — FENTANYL CITRATE (PF) 100 MCG/2ML IJ SOLN
INTRAMUSCULAR | Status: DC | PRN
  Administered 2022-06-04 (×3): 50 ug via INTRAVENOUS

## 2022-06-04 MED ORDER — SODIUM CHLORIDE 0.9% FLUSH: 3.0000 mL | INTRAVENOUS | Status: DC | PRN

## 2022-06-04 MED ORDER — SODIUM CHLORIDE 0.9 % IV SOLN: 250.0000 mL | INTRAVENOUS | Status: DC | PRN

## 2022-06-04 MED ORDER — ACETAMINOPHEN 325 MG PO TABS: 650.0000 mg | ORAL_TABLET | ORAL | Status: DC | PRN

## 2022-06-04 MED ORDER — SODIUM CHLORIDE 0.9 % IV SOLN: INTRAVENOUS | Status: DC

## 2022-06-04 MED ORDER — SODIUM CHLORIDE 0.9% FLUSH: 3.0000 mL | Freq: Two times a day (BID) | INTRAVENOUS | Status: DC

## 2022-06-04 MED ORDER — LIDOCAINE HCL (PF) 1 % IJ SOLN
INTRAMUSCULAR | Status: AC
  Filled 2022-06-04: qty 30

## 2022-06-04 MED ORDER — ONDANSETRON HCL 4 MG/2ML IJ SOLN: 4.0000 mg | Freq: Four times a day (QID) | INTRAMUSCULAR | Status: DC | PRN

## 2022-06-04 MED ORDER — HEPARIN (PORCINE) IN NACL 1000-0.9 UT/500ML-% IV SOLN
INTRAVENOUS | Status: DC | PRN
  Administered 2022-06-04 (×2): 500 mL

## 2022-06-04 MED ORDER — MIDAZOLAM HCL 2 MG/2ML IJ SOLN
INTRAMUSCULAR | Status: DC | PRN
  Administered 2022-06-04: 1 mg via INTRAVENOUS

## 2022-06-04 MED ORDER — LABETALOL HCL 5 MG/ML IV SOLN
INTRAVENOUS | Status: AC
  Filled 2022-06-04: qty 4

## 2022-06-04 MED ORDER — IODIXANOL 320 MG/ML IV SOLN
INTRAVENOUS | Status: DC | PRN
  Administered 2022-06-04: 55 mL

## 2022-06-04 MED ORDER — LABETALOL HCL 5 MG/ML IV SOLN: 10.0000 mg | INTRAVENOUS | Status: DC | PRN

## 2022-06-04 MED ORDER — LABETALOL HCL 5 MG/ML IV SOLN
INTRAVENOUS | Status: DC | PRN
  Administered 2022-06-04: 10 mg via INTRAVENOUS

## 2022-06-04 MED ORDER — SODIUM CHLORIDE 0.9 % WEIGHT BASED INFUSION: 1.0000 mL/kg/h | INTRAVENOUS | Status: DC

## 2022-06-04 MED ORDER — MIDAZOLAM HCL 2 MG/2ML IJ SOLN
INTRAMUSCULAR | Status: AC
  Filled 2022-06-04: qty 2

## 2022-06-04 MED ORDER — SODIUM CHLORIDE 0.9 % WEIGHT BASED INFUSION
3.0000 mL/kg/h | INTRAVENOUS | Status: AC
  Administered 2022-06-04: 3 mL/kg/h via INTRAVENOUS

## 2022-06-04 MED ORDER — LIDOCAINE HCL (PF) 1 % IJ SOLN
INTRAMUSCULAR | Status: DC | PRN
  Administered 2022-06-04: 20 mL via INTRADERMAL

## 2022-06-04 SURGICAL SUPPLY — 13 items
CATH ANGIO 5F PIGTAIL 65CM (CATHETERS) IMPLANT
CATH CROSS OVER TEMPO 5F (CATHETERS) IMPLANT
CATH STRAIGHT 5FR 65CM (CATHETERS) IMPLANT
DEVICE CLOSURE MYNXGRIP 5F (Vascular Products) IMPLANT
KIT MICROPUNCTURE NIT STIFF (SHEATH) IMPLANT
KIT PV (KITS) ×2 IMPLANT
SHEATH PINNACLE 5F 10CM (SHEATH) IMPLANT
STOPCOCK MORSE 400PSI 3WAY (MISCELLANEOUS) IMPLANT
SYR MEDRAD MARK 7 150ML (SYRINGE) ×2 IMPLANT
TRANSDUCER W/STOPCOCK (MISCELLANEOUS) ×2 IMPLANT
TRAY PV CATH (CUSTOM PROCEDURE TRAY) ×2 IMPLANT
TUBING CIL FLEX 10 FLL-RA (TUBING) IMPLANT
WIRE HITORQ VERSACORE ST 145CM (WIRE) IMPLANT

## 2022-06-04 NOTE — Interval H&P Note (Signed)
History and Physical Interval Note:  06/04/2022 11:00 AM  Rose [name]  has presented today for surgery, with the diagnosis of peripheral artery disease.  The various methods of treatment have been discussed with the patient and family. After consideration of risks, benefits and other options for treatment, the patient has consented to  Procedure(s): ABDOMINAL AORTOGRAM W/LOWER EXTREMITY (N/A) as a surgical intervention.  The patient's history has been reviewed, patient examined, no change in status, stable for surgery.  I have reviewed the patient's chart and labs.  Questions were answered to the patient's satisfaction.     [name]

## 2022-06-04 NOTE — Progress Notes (Signed)
Patient and daughter was giving dc instruction. Patient procedural sites looks intact with no signs of bleeding or hematoma. Patient ambulated and voided with no issues.

## 2022-06-10 ENCOUNTER — Ambulatory Visit (INDEPENDENT_AMBULATORY_CARE_PROVIDER_SITE_OTHER): Payer: 59 | Admitting: Podiatry

## 2022-06-10 ENCOUNTER — Encounter: Payer: Self-pay | Admitting: Podiatry

## 2022-06-10 DIAGNOSIS — E1151 Type 2 diabetes mellitus with diabetic peripheral angiopathy without gangrene: Secondary | ICD-10-CM

## 2022-06-10 DIAGNOSIS — T148XXA Other injury of unspecified body region, initial encounter: Secondary | ICD-10-CM

## 2022-06-10 NOTE — Progress Notes (Signed)
Subjective:  Patient ID: Rose [name], female    DOB: 1956/03/15,  MRN: [id number]  Chief Complaint  Patient presents with   Wound Check    67 y.o. female presents with the above complaint.  Patient presents for follow-up of right submetatarsal 1 blister with now superficial wound she is states that is doing much better the Santyl is helping.   Review of Systems: Negative except as noted in the HPI. Denies N/V/F/Ch.  Past Medical History:  Diagnosis Date   ACE inhibitor-aggravated angioedema, initial encounter 04/26/2016   Allergic rhinitis    Anxiety    Arthritis    Calculus of gallbladder without cholecystitis without obstruction 10/28/2018   Chronic back pain    CKD (chronic kidney disease), stage III ([place])    folllowed by pcp   Coronary artery disease    Depression    Diabetic neuropathy ([place])    Edema of lower extremity 10/28/2018   Encounter for completion of form with patient 06/21/2019   Environmental allergies    Environmental allergies 12/28/2015   Fibromyalgia    Foot ulcer secondary to diabetes 06/21/2019   Frequency of urination    History of chronic bronchitis    History of drug abuse ([place]) states quit herion in 2006   History of hepatitis C last abd ultrasound in epic 11-10-2018;  last hepative panel in epic 11-30-2018 was normal   previously followed by [facility] (notes in care everywhere)-- dx 04/ 2015,  started harvonic treatment 07/ 2015   History of rib fracture 10/28/2018   HPV in female 06/11/2016   Hypertension    followed by pcp  (nuclear stress test 01-15-2017 in epic,  showed low risk normal w/ nuclear ef 55%)   Laceration of right foot 03/04/2019   Lipoma of neck 07/16/2017   Seen by general surgery Dr Marlou Starks May 16 - plan for removal of 2cm sebaceous cyst.    Methadone maintenance therapy patient ([place])    Mild asthma    followed by pcp   Nocturia    PAD (peripheral artery disease) ([place])    followed by cardiology, dr  Fletcher Anon---  06-01-2019  s/p bilateral CIA angioplasty stenting   Recurrent falls 04/30/2016   Right foot ulcer ([place])    Symptomatic anemia 11/15/2020   Tendonitis    bilateral wrist   Type 2 diabetes mellitus ([place])    followed by pcp   (06-16-2019  per pt checks blood sugar every other day in am,  fasting sugar 92-95)   Urge urinary incontinence     Current Outpatient Medications:    albuterol (VENTOLIN HFA) 108 (90 Base) MCG/ACT inhaler, Inhale 2 puffs into the lungs every 6 (six) hours as needed. For wheezing, Disp: 1 each, Rfl: 2   atorvastatin (LIPITOR) 80 MG tablet, Take 1 tablet (80 mg total) by mouth daily., Disp: 30 tablet, Rfl: 11   carvedilol (COREG) 6.25 MG tablet, Take 1 tablet (6.25 mg total) by mouth 2 (two) times daily., Disp: 60 tablet, Rfl: 11   cetirizine (ZYRTEC) 10 MG tablet, Take 1 tablet (10 mg total) by mouth daily., Disp: 30 tablet, Rfl: 11   clopidogrel (PLAVIX) 75 MG tablet, Take 1 tablet (75 mg total) by mouth daily with breakfast., Disp: 90 tablet, Rfl: 3   collagenase (SANTYL) 250 UNIT/GM ointment, Apply 1 Application topically daily., Disp: 15 g, Rfl: 0   DULoxetine (CYMBALTA) 20 MG capsule, TAKE THREE CAPSULES (60 MG) BY MOUTH DAILY AT 9AM, Disp: 90 capsule,  Rfl: 2   Empagliflozin-metFORMIN HCl ER 12-998 MG TB24, Take 1 tablet by mouth daily., Disp: 60 tablet, Rfl: 2   fluticasone (FLONASE) 50 MCG/ACT nasal spray, One spray in each nostril daily, Disp: 16 g, Rfl: 5   GERI-KOT 8.6 MG tablet, TAKE TWO TABLETS BY MOUTH DAILY AT 9AM, Disp: 60 tablet, Rfl: 11   hydrochlorothiazide (HYDRODIURIL) 25 MG tablet, Take 1 tablet (25 mg total) by mouth daily., Disp: 90 tablet, Rfl: 3   methadone (DOLOPHINE) 10 MG/5ML solution, Take 126 mg by mouth daily. , Disp: , Rfl:    Multiple Vitamins-Minerals (CENTRUM WOMEN) TABS, Take 1 tablet by mouth daily., Disp: , Rfl:    nicotine (NICODERM CQ - DOSED IN MG/24 HOURS) 14 mg/24hr patch, Medicaid [id number] L Wks 1-6: 14 mg x 1 patch  daily. Wear for 24 hours. If you have sleep disturbances, remove at bedtime. (Patient taking differently: Place 14 mg onto the skin See admin instructions. Medicaid [id number] L Wks 1-6: 14 mg x 1 patch daily. Wear for 24 hours. If you have sleep disturbances, remove at bedtime.), Disp: 30 patch, Rfl: 2   Sennosides (SENNA) 8.6 MG CAPS, Take 2 tablets by mouth daily., Disp: 60 capsule, Rfl: 2  Social History   Tobacco Use  Smoking Status Some Days   Packs/day: 0.50   Years: 20.00   Total pack years: 10.00   Types: Cigarettes  Smokeless Tobacco Never  Tobacco Comments   SMOKES 4 CIGS PER DAY    Allergies  Allergen Reactions   Lisinopril Swelling    Angioedema   Gabapentin Hives and Other (See Comments)    Only when takes over 300 mg dose. "gives me the shakes"   Vicodin [Hydrocodone-Acetaminophen] Hives, Diarrhea and Other (See Comments)    Stomach  cramps   Hydrocodone-Acetaminophen    Objective:  There were no vitals filed for this visit. There is no height or weight on file to calculate BMI. Constitutional Well developed. Well nourished.  Vascular Dorsalis pedis pulses palpable bilaterally. Posterior tibial pulses palpable bilaterally. Capillary refill normal to all digits.  No cyanosis or clubbing noted. Pedal hair growth normal.  Neurologic Normal speech. Oriented to person, place, and time. Epicritic sensation to light touch grossly present bilaterally.  Dermatologic Right hallux submetatarsal 1 superficial wound noted.  Doing well the wound size is decreasing considerably.  Orthopedic: Normal joint ROM without pain or crepitus bilaterally. No visible deformities. No bony tenderness.   Radiographs: None Assessment:   No diagnosis found.  Plan:  Patient was evaluated and treated and all questions answered.  Right submet 1 friction blister -All question and concerns were discussed with the patient in extensive detail.  She is a diabetic with controlled sugars.    -Continue Santyl wet-to-dry dressing.  Minimal debridement was carried out. -I discussed prevention technique as well.  I will see her back in 6 weeks and and I am hopeful that it may be reepithelialized by then  No follow-ups on file.

## 2022-06-11 ENCOUNTER — Telehealth: Payer: Self-pay | Admitting: Podiatry

## 2022-06-11 ENCOUNTER — Other Ambulatory Visit: Payer: Self-pay

## 2022-06-11 MED ORDER — SANTYL 250 UNIT/GM EX OINT: 1.0000 | TOPICAL_OINTMENT | Freq: Every day | CUTANEOUS | 0 refills | Status: DC

## 2022-06-11 NOTE — Telephone Encounter (Signed)
Refill request  collagenase (SANTYL) 250 UNIT/GM ointment   [id number] - [place], [place]   Please advise

## 2022-06-17 ENCOUNTER — Ambulatory Visit: Payer: 59 | Attending: Cardiovascular Disease | Admitting: Cardiovascular Disease

## 2022-06-17 VITALS — BP 122/62 | HR 74 | Ht 63.0 in | Wt 139.2 lb

## 2022-06-17 DIAGNOSIS — Z720 Tobacco use: Secondary | ICD-10-CM | POA: Diagnosis not present

## 2022-06-17 DIAGNOSIS — I2510 Atherosclerotic heart disease of native coronary artery without angina pectoris: Secondary | ICD-10-CM

## 2022-06-17 DIAGNOSIS — E785 Hyperlipidemia, unspecified: Secondary | ICD-10-CM | POA: Diagnosis not present

## 2022-06-17 DIAGNOSIS — I739 Peripheral vascular disease, unspecified: Secondary | ICD-10-CM | POA: Diagnosis not present

## 2022-06-17 DIAGNOSIS — I10 Essential (primary) hypertension: Secondary | ICD-10-CM | POA: Diagnosis not present

## 2022-06-17 NOTE — Patient Instructions (Signed)
Medication Instructions:  No changes *If you need a refill on your cardiac medications before your next appointment, please call your pharmacy*   Lab Work: None ordered If you have labs (blood work) drawn today and your tests are completely normal, you will receive your results only by: MyChart Message (if you have MyChart) OR A paper copy in the mail If you have any lab test that is abnormal or we need to change your treatment, we will call you to review the results.   Testing/Procedures: None ordered   Follow-Up: At [facility], you and your health needs are our priority.  As part of our continuing mission to provide you with exceptional heart care, we have created designated Provider Care Teams.  These Care Teams include your primary Cardiologist (physician) and Advanced Practice Providers (APPs -  Physician Assistants and Nurse Practitioners) who all work together to provide you with the care you need, when you need it.  We recommend signing up for the patient portal called "MyChart".  Sign up information is provided on this After Visit Summary.  MyChart is used to connect with patients for Virtual Visits (Telemedicine).  Patients are able to view lab/test results, encounter notes, upcoming appointments, etc.  Non-urgent messages can be sent to your provider as well.   To learn more about what you can do with MyChart, go to [email or web address].    Your next appointment:   6 month(s)  Provider:   Muhammad Arida, MD   

## 2022-06-17 NOTE — Progress Notes (Signed)
Cardiology Office Note   Date:  06/17/2022   ID:  Renley, Koetz December 23, 1955, MRN [id number]  PCP:  Iona Coach, MD  Cardiologist:   [name], MD   No chief complaint on file.      History of Present Illness: Rose [name] is a 67 y.o. female who is here today for follow-up visit regarding peripheral arterial disease and coronary artery disease.  The patient has multiple chronic medical conditions that include type 2 diabetes, essential hypertension, hyperlipidemia, remote IV drug use and tobacco use. She is status post bilateral common iliac artery stenting done in March 2021.  She had no significant infrainguinal disease at that time.  She had an ulceration on the right lateral foot at that time.   She was seen in September of 2021 for exertional chest pain.  She underwent a Lexiscan Myoview which showed evidence of anterior and anterolateral ischemia.  Cardiac catheterization in October showed severe one-vessel coronary artery disease with 90% heavily calcified stenosis in the mid LAD and moderate left circumflex disease.  Ejection fraction was normal with mildly elevated left ventricular end-diastolic pressure.  I performed successful intravascular lithotripsy and drug eluting stent placement to the mid LAD.    She was able to quit smoking at some point but she relapsed after the death of her sister.   She was seen recently for a small ulceration at the bottom of the right foot after an injury. I proceeded with angiography earlier this month which showed patent common iliac artery stent with mild in-stent restenosis, mild nonobstructive disease affecting the right SFA and popliteal arteries with three-vessel runoff below the knee.  No revascularization was needed.  She is doing well with no chest pain or shortness of breath.  The ulceration in the right foot is healing.  Past Medical History:  Diagnosis Date   ACE inhibitor-aggravated angioedema,  initial encounter 04/26/2016   Allergic rhinitis    Anxiety    Arthritis    Calculus of gallbladder without cholecystitis without obstruction 10/28/2018   Chronic back pain    CKD (chronic kidney disease), stage III ([place])    folllowed by pcp   Coronary artery disease    Depression    Diabetic neuropathy ([place])    Edema of lower extremity 10/28/2018   Encounter for completion of form with patient 06/21/2019   Environmental allergies    Environmental allergies 12/28/2015   Fibromyalgia    Foot ulcer secondary to diabetes 06/21/2019   Frequency of urination    History of chronic bronchitis    History of drug abuse ([place]) states quit herion in 2006   History of hepatitis C last abd ultrasound in epic 11-10-2018;  last hepative panel in epic 11-30-2018 was normal   previously followed by [facility] (notes in care everywhere)-- dx 04/ 2015,  started harvonic treatment 07/ 2015   History of rib fracture 10/28/2018   HPV in female 06/11/2016   Hypertension    followed by pcp  (nuclear stress test 01-15-2017 in epic,  showed low risk normal w/ nuclear ef 55%)   Laceration of right foot 03/04/2019   Lipoma of neck 07/16/2017   Seen by general surgery Dr Marlou Starks May 16 - plan for removal of 2cm sebaceous cyst.    Methadone maintenance therapy patient ([place])    Mild asthma    followed by pcp   Nocturia    PAD (peripheral artery disease) ([place])    followed by  cardiology, dr Fletcher Anon---  06-01-2019  s/p bilateral CIA angioplasty stenting   Recurrent falls 04/30/2016   Right foot ulcer ([place])    Symptomatic anemia 11/15/2020   Tendonitis    bilateral wrist   Type 2 diabetes mellitus ([place])    followed by pcp   (06-16-2019  per pt checks blood sugar every other day in am,  fasting sugar 92-95)   Urge urinary incontinence     Past Surgical History:  Procedure Laterality Date   ABDOMINAL AORTOGRAM W/LOWER EXTREMITY Bilateral 06/01/2019   Procedure: ABDOMINAL AORTOGRAM W/LOWER EXTREMITY;   Surgeon: Wellington Hampshire, MD;  Location: [place] [facility];  Service: Cardiovascular;  Laterality: Bilateral;   ABDOMINAL AORTOGRAM W/LOWER EXTREMITY N/A 06/04/2022   Procedure: ABDOMINAL AORTOGRAM W/LOWER EXTREMITY;  Surgeon: Wellington Hampshire, MD;  Location: [place] [facility];  Service: Cardiovascular;  Laterality: N/A;   APPENDECTOMY  1972   INTERSTIM IMPLANT PLACEMENT  10/23/2011   Procedure: Barrie Lyme IMPLANT FIRST STAGE;  Surgeon: Reece Packer, MD;  [facility];  Service: Urology;  Laterality: N/A;   INTERSTIM IMPLANT PLACEMENT  10/23/2011   Procedure: Barrie Lyme IMPLANT SECOND STAGE;  Surgeon: Reece Packer, MD;  Location: [facility];  Service: Urology;  Laterality: N/A;   LEFT HEART CATH AND CORONARY ANGIOGRAPHY N/A 01/11/2020   Procedure: LEFT HEART CATH AND CORONARY ANGIOGRAPHY;  Surgeon: Wellington Hampshire, MD;  Location: [place] [facility];  Service: Cardiovascular;  Laterality: N/A;   PANACOS TISSUE GRAFT Right 06/24/2019   Procedure: Application of Skin Graft Substitute;  Surgeon: Evelina Bucy, DPM;  Location: [place];  Service: Podiatry;  Laterality: Right;   PERIPHERAL VASCULAR INTERVENTION Bilateral 06/01/2019   Procedure: PERIPHERAL VASCULAR INTERVENTION;  Surgeon: Wellington Hampshire, MD;  Location: [place] [facility];  Service: Cardiovascular;  Laterality: Bilateral;   WOUND DEBRIDEMENT Right 06/24/2019   Procedure: DEBRIDEMENT WOUND;  Surgeon: Evelina Bucy, DPM;  Location: [place];  Service: Podiatry;  Laterality: Right;   WOUND DEBRIDEMENT Right 08/24/2019   Procedure: DEBRIDEMENT RIGHT FOOT WOUND; APPLICATION OF SKIN GRAFT SUBSTITUTE;  Surgeon: Evelina Bucy, DPM;  Location: [place];  Service: Podiatry;  Laterality: Right;     Current Outpatient Medications  Medication Sig Dispense Refill   albuterol (VENTOLIN HFA) 108 (90 Base) MCG/ACT inhaler Inhale 2 puffs  into the lungs every 6 (six) hours as needed. For wheezing 1 each 2   atorvastatin (LIPITOR) 80 MG tablet Take 1 tablet (80 mg total) by mouth daily. 30 tablet 11   carvedilol (COREG) 6.25 MG tablet Take 1 tablet (6.25 mg total) by mouth 2 (two) times daily. 60 tablet 11   cetirizine (ZYRTEC) 10 MG tablet Take 1 tablet (10 mg total) by mouth daily. 30 tablet 11   clopidogrel (PLAVIX) 75 MG tablet Take 1 tablet (75 mg total) by mouth daily with breakfast. 90 tablet 3   collagenase (SANTYL) 250 UNIT/GM ointment Apply 1 Application topically daily. 15 g 0   DULoxetine (CYMBALTA) 20 MG capsule TAKE THREE CAPSULES (60 MG) BY MOUTH DAILY AT 9AM 90 capsule 2   Empagliflozin-metFORMIN HCl ER 12-998 MG TB24 Take 1 tablet by mouth daily. 60 tablet 2   fluticasone (FLONASE) 50 MCG/ACT nasal spray One spray in each nostril daily 16 g 5   GERI-KOT 8.6 MG tablet TAKE TWO TABLETS BY MOUTH DAILY AT 9AM 60 tablet 11   hydrochlorothiazide (HYDRODIURIL) 25 MG tablet Take  1 tablet (25 mg total) by mouth daily. 90 tablet 3   methadone (DOLOPHINE) 10 MG/5ML solution Take 126 mg by mouth daily.      Multiple Vitamins-Minerals (CENTRUM WOMEN) TABS Take 1 tablet by mouth daily.     nicotine (NICODERM CQ - DOSED IN MG/24 HOURS) 14 mg/24hr patch Medicaid [id number] L Wks 1-6: 14 mg x 1 patch daily. Wear for 24 hours. If you have sleep disturbances, remove at bedtime. (Patient taking differently: Place 14 mg onto the skin See admin instructions. Medicaid [id number] L Wks 1-6: 14 mg x 1 patch daily. Wear for 24 hours. If you have sleep disturbances, remove at bedtime.) 30 patch 2   Sennosides (SENNA) 8.6 MG CAPS Take 2 tablets by mouth daily. 60 capsule 2   No current facility-administered medications for this visit.    Allergies:   Lisinopril, Gabapentin, Vicodin [hydrocodone-acetaminophen], and Hydrocodone-acetaminophen    Social History:  The patient  reports that she has been smoking cigarettes. She has a 10.00 pack-year  smoking history. She has never used smokeless tobacco. She reports that she does not currently use drugs. She reports that she does not drink alcohol.   Family History:  The patient's family history includes CAD in her father; Diabetes type II in her brother, mother, and sister; Heart attack (age of onset: 73) in her brother; Heart attack (age of onset: 2) in her father and mother; Hypertension in her brother and mother.    ROS:  Please see the history of present illness.   Otherwise, review of systems are positive for none.   All other systems are reviewed and negative.    PHYSICAL EXAM: VS:  BP 122/62   Pulse 74   Ht 5\' 3"  (1.6 m)   Wt 139 lb 3.2 oz (63.1 kg)   SpO2 96%   BMI 24.66 kg/m  , BMI Body mass index is 24.66 kg/m. GEN: Well nourished, well developed, in no acute distress  HEENT: normal  Neck: no JVD, carotid bruits, or masses Cardiac: RRR; no murmurs, rubs, or gallops, mild bilateral leg edema Respiratory:  clear to auscultation bilaterally, normal work of breathing GI: soft, nontender, nondistended, + BS MS: no deformity or atrophy  Skin: warm and dry, no rash Neuro:  Strength and sensation are intact Psych: euthymic mood, full affect    EKG:  EKG is not ordered today.   Recent Labs: 05/20/2022: BUN 17; Creatinine, Ser 1.51; Hemoglobin 11.7; Platelets 278; Potassium 3.7; Sodium 139    Lipid Panel    Component Value Date/Time   CHOL 98 (L) 12/10/2021 1115   TRIG 160 (H) 12/10/2021 1115   HDL 29 (L) 12/10/2021 1115   CHOLHDL 3.4 12/10/2021 1115   LDLCALC 42 12/10/2021 1115      Wt Readings from Last 3 Encounters:  06/17/22 139 lb 3.2 oz (63.1 kg)  06/04/22 145 lb (65.8 kg)  05/20/22 142 lb 12.8 oz (64.8 kg)           12/19/2016    1:37 PM  PAD Screen  Previous PAD dx? No  Previous surgical procedure? No  Pain with walking? No  Feet/toe relief with dangling? Yes  Painful, non-healing ulcers? No  Extremities discolored? No      ASSESSMENT  AND PLAN:  1.  Peripheral arterial disease: Status post  stent placement to bilateral common iliac arteries.  Recent angiography showed patent iliac stents with mild in-stent restenosis.  No significant obstructive disease was noted in the right lower extremity and thus  no revascularization was needed.  Continue medical therapy.    2. Coronary artery disease involving native coronary arteries without angina : She is doing well with no anginal symptoms.  Continue medical therapy.  She is on long-term dual antiplatelet therapy due to her combined coronary and peripheral arterial disease.  3.  Tobacco use: She reports that the nicotine patch is not helping her quit.  I discussed the importance of continued attempts of smoking cessation.  4.  Hyperlipidemia: Continue treatment with atorvastatin.  I reviewed most recent lipid profile done in September which showed an LDL of 42.  5.  Essential hypertension: Blood pressure is controlled.   Disposition:   Follow-up in 6 months.  Signed,  [name], MD  06/17/2022 11:04 AM    [place]

## 2022-06-23 ENCOUNTER — Other Ambulatory Visit: Payer: Self-pay

## 2022-06-23 ENCOUNTER — Ambulatory Visit (INDEPENDENT_AMBULATORY_CARE_PROVIDER_SITE_OTHER): Payer: 59 | Admitting: Internal Medicine

## 2022-06-23 ENCOUNTER — Encounter: Payer: Self-pay | Admitting: Internal Medicine

## 2022-06-23 VITALS — BP 120/61 | HR 72 | Temp 98.1°F | Ht 63.0 in | Wt 139.5 lb

## 2022-06-23 DIAGNOSIS — E1169 Type 2 diabetes mellitus with other specified complication: Secondary | ICD-10-CM

## 2022-06-23 DIAGNOSIS — M5441 Lumbago with sciatica, right side: Secondary | ICD-10-CM | POA: Diagnosis not present

## 2022-06-23 DIAGNOSIS — G8929 Other chronic pain: Secondary | ICD-10-CM

## 2022-06-23 DIAGNOSIS — Z7984 Long term (current) use of oral hypoglycemic drugs: Secondary | ICD-10-CM | POA: Diagnosis not present

## 2022-06-23 DIAGNOSIS — I2510 Atherosclerotic heart disease of native coronary artery without angina pectoris: Secondary | ICD-10-CM

## 2022-06-23 LAB — POCT GLYCOSYLATED HEMOGLOBIN (HGB A1C): Hemoglobin A1C: 6.3 % — AB (ref 4.0–5.6)

## 2022-06-23 LAB — GLUCOSE, CAPILLARY: Glucose-Capillary: 92 mg/dL (ref 70–99)

## 2022-06-23 MED ORDER — NICOTINE POLACRILEX 4 MG MT GUM: 4.0000 mg | CHEWING_GUM | OROMUCOSAL | 0 refills | Status: DC | PRN

## 2022-06-23 MED ORDER — NICOTINE 21 MG/24HR TD PT24: 21.0000 mg | MEDICATED_PATCH | TRANSDERMAL | 1 refills | Status: AC

## 2022-06-23 MED ORDER — ASPIRIN 81 MG PO TBEC: 81.0000 mg | DELAYED_RELEASE_TABLET | Freq: Every day | ORAL | 11 refills | Status: DC

## 2022-06-23 NOTE — Patient Instructions (Addendum)
Dear Mrs. Cancellieri,  Thank you for trusting [place] with your care today.  For your back pain,   Please stop the Aleve. Use Tylenol instead. Please avoid taking NSAIDs as they can cause problems with your heart. Please try using ice and/or heat as necessary to help with your back. I will refer you to the physical therapist and the pain management doctor for back injections.   Please continue taking the empagliflozin-metformin.   Please take the Plavix and aspirin daily. These medicines will help prevent heart attacks. If you notice blood in your stool, please contact our office.  I have also sent in nicotine patches and gum to use to help stop smoking.   Please return in 3 months for a follow up.

## 2022-06-23 NOTE — Progress Notes (Unsigned)
CC: f/u from last OV for DM  HPI:[name] is a 67 y.o. female who presents for evaluation of DM. Please see individual problem based A/P for details.  Hx DM2, pad/cad s/p pci  and des to lad, tobacco use, chronic back pain, substance use disorder, OIC,   CAD s/p DES to LAD (12/2019) Patient with hx of CAD and PAD s/p stenting in both heart and legs. She is supposed to be on DAPT, but has not been taking aspirin. She continues to smoke. Says that the patches worked, but she ran out of the Rx so she was buying them out of pocket. She is wanting to stop smoking and willing to try patches and gum. Chantix was ordered before but she never picked up because she didn't like the side effect profile. She still does not want chantix. Counseled patient on importance of smoking cessation and how to most effectively use nicotine replacement therapy.  - continue plavix - start daily aspirin 81 - nicotine patches and nicorette gum  Type 2 diabetes mellitus ([place]) Patient has been taking empagliflozin metformin 12-998. She is not having any N/V/D or UTI symptoms.  A1c 6.3, well controlled. Continue current regimen.   Chronic back pain Patient reports ongoing back pain. She saw PT which did help some. She also saw ortho for spine injections which helped. She has never tried thermotherapy or stretching. Alleve does help some. She had tried to get a rollator with seat to help when walking distances, but just got the standard walker. She does not use it because it is too heavy to haul around. Tried to replace it, because insurance would not exchange since she had not had for more than 2 years. She is still using her cane.  - Patient will need to avoid NSAIDs given heart history.  Recommended thermotherapy, stretching. We will refer her back to PT for additional therapy. She has seen ortho in the past for back injections so we will refer her to them again.    Depression, PHQ-9: Based on the  patients  [place] Visit from 04/23/2022 in [place]  PHQ-9 Total Score 4      score we have .  Past Medical History:  Diagnosis Date   ACE inhibitor-aggravated angioedema, initial encounter 04/26/2016   Allergic rhinitis    Anxiety    Arthritis    Calculus of gallbladder without cholecystitis without obstruction 10/28/2018   Chronic back pain    CKD (chronic kidney disease), stage III ([place])    folllowed by pcp   Coronary artery disease    Depression    Diabetic neuropathy ([place])    Edema of lower extremity 10/28/2018   Encounter for completion of form with patient 06/21/2019   Environmental allergies    Environmental allergies 12/28/2015   Fibromyalgia    Foot ulcer secondary to diabetes 06/21/2019   Frequency of urination    History of chronic bronchitis    History of drug abuse ([place]) states quit herion in 2006   History of hepatitis C last abd ultrasound in epic 11-10-2018;  last hepative panel in epic 11-30-2018 was normal   previously followed by [facility] (notes in care everywhere)-- dx 04/ 2015,  started harvonic treatment 07/ 2015   History of rib fracture 10/28/2018   HPV in female 06/11/2016   Hypertension    followed by pcp  (nuclear stress test 01-15-2017 in epic,  showed low risk normal w/ nuclear  ef 55%)   Laceration of right foot 03/04/2019   Lipoma of neck 07/16/2017   Seen by general surgery Dr Marlou Starks May 16 - plan for removal of 2cm sebaceous cyst.    Methadone maintenance therapy patient ([place])    Mild asthma    followed by pcp   Nocturia    PAD (peripheral artery disease) ([place])    followed by cardiology, dr Fletcher Anon---  06-01-2019  s/p bilateral CIA angioplasty stenting   Recurrent falls 04/30/2016   Right foot ulcer ([place])    Symptomatic anemia 11/15/2020   Tendonitis    bilateral wrist   Type 2 diabetes mellitus ([place])    followed by pcp   (06-16-2019  per pt checks blood sugar every other day in am,   fasting sugar 92-95)   Urge urinary incontinence    Review of Systems:   See HPI  Physical Exam: Vitals:   06/23/22 1344  BP: 120/61  Pulse: 72  Temp: 98.1 F (36.7 C)  TempSrc: Oral  SpO2: 99%  Weight: 139 lb 8 oz (63.3 kg)  Height: 5\' 3"  (1.6 m)   General: nad HEENT: Conjunctiva nl , antiicteric sclerae, moist mucous membranes, no exudate or erythema Cardiovascular: Normal rate, regular rhythm.  No murmurs, rubs, or gallops Pulmonary : Equal breath sounds, No wheezes, rales, or rhonchi Abdominal: soft, nontender,  bowel sounds present Ext: No edema in lower extremities, no tenderness to palpation of lower extremities.   Assessment & Plan:   See Encounters Tab for problem based charting.  Patient discussed with Dr.  Saverio Danker

## 2022-06-25 ENCOUNTER — Encounter: Payer: Self-pay | Admitting: Internal Medicine

## 2022-06-25 NOTE — Assessment & Plan Note (Signed)
Patient with hx of CAD and PAD s/p stenting in both heart and legs. She is supposed to be on DAPT, but has not been taking aspirin. She continues to smoke. Says that the patches worked, but she ran out of the Rx so she was buying them out of pocket. She is wanting to stop smoking and willing to try patches and gum. Chantix was ordered before but she never picked up because she didn't like the side effect profile. She still does not want chantix. Counseled patient on importance of smoking cessation and how to most effectively use nicotine replacement therapy.  - continue plavix - start daily aspirin 81 - nicotine patches and nicorette gum

## 2022-06-25 NOTE — Assessment & Plan Note (Signed)
Patient has been taking empagliflozin metformin 12-998. She is not having any N/V/D or UTI symptoms.  A1c 6.3, well controlled. Continue current regimen.

## 2022-06-25 NOTE — Assessment & Plan Note (Signed)
Patient reports ongoing back pain. She saw PT which did help some. She also saw ortho for spine injections which helped. She has never tried thermotherapy or stretching. Alleve does help some. She had tried to get a rollator with seat to help when walking distances, but just got the standard walker. She does not use it because it is too heavy to haul around. Tried to replace it, because insurance would not exchange since she had not had for more than 2 years. She is still using her cane.  - Patient will need to avoid NSAIDs given heart history.  Recommended thermotherapy, stretching. We will refer her back to PT for additional therapy. She has seen ortho in the past for back injections so we will refer her to them again.

## 2022-06-26 NOTE — Addendum Note (Signed)
Addended by: Charise Killian on: 06/26/2022 11:04 AM   Modules accepted: Level of Service

## 2022-06-26 NOTE — Progress Notes (Signed)
Internal Medicine Clinic Attending ° °Case discussed with Dr. Gawaluck  At the time of the visit.  We reviewed the resident’s history and exam and pertinent patient test results.  I agree with the assessment, diagnosis, and plan of care documented in the resident’s note.  °

## 2022-07-21 ENCOUNTER — Telehealth: Payer: Self-pay | Admitting: Physical Medicine and Rehabilitation

## 2022-07-21 NOTE — Telephone Encounter (Signed)
Patient states she need a call about an appointment for an injection

## 2022-07-22 NOTE — Telephone Encounter (Signed)
Tried calling to answer. LMVM to return call

## 2022-07-23 ENCOUNTER — Ambulatory Visit: Payer: 59 | Admitting: Podiatry

## 2022-07-23 ENCOUNTER — Ambulatory Visit: Payer: 59 | Admitting: Physical Medicine and Rehabilitation

## 2022-07-24 ENCOUNTER — Other Ambulatory Visit: Payer: Self-pay | Admitting: Internal Medicine

## 2022-07-24 ENCOUNTER — Other Ambulatory Visit: Payer: Self-pay

## 2022-07-24 NOTE — Telephone Encounter (Signed)
Next appt scheduled 6/25 with PCP. 

## 2022-07-30 ENCOUNTER — Ambulatory Visit: Payer: 59 | Admitting: Physical Medicine and Rehabilitation

## 2022-07-31 ENCOUNTER — Ambulatory Visit (INDEPENDENT_AMBULATORY_CARE_PROVIDER_SITE_OTHER): Payer: 59 | Admitting: Podiatry

## 2022-07-31 DIAGNOSIS — E1151 Type 2 diabetes mellitus with diabetic peripheral angiopathy without gangrene: Secondary | ICD-10-CM | POA: Diagnosis not present

## 2022-07-31 DIAGNOSIS — T148XXA Other injury of unspecified body region, initial encounter: Secondary | ICD-10-CM | POA: Diagnosis not present

## 2022-07-31 NOTE — Progress Notes (Signed)
Subjective:  Patient ID: Rose [name], female    DOB: 1956/03/25,  MRN: [id number]  Chief Complaint  Patient presents with   Wound Check    Rt foot wound check     67 y.o. female presents with the above complaint.  Patient presents for follow-up of right submetatarsal 1 blister which has improved considerably.  No further wound noted.   Review of Systems: Negative except as noted in the HPI. Denies N/V/F/Ch.  Past Medical History:  Diagnosis Date   ACE inhibitor-aggravated angioedema, initial encounter 04/26/2016   Allergic rhinitis    Anxiety    Arthritis    Calculus of gallbladder without cholecystitis without obstruction 10/28/2018   Chronic back pain    CKD (chronic kidney disease), stage III (HCC)    folllowed by pcp   Coronary artery disease    Depression    Diabetic neuropathy (HCC)    Edema of lower extremity 10/28/2018   Encounter for completion of form with patient 06/21/2019   Environmental allergies    Environmental allergies 12/28/2015   Fibromyalgia    Foot ulcer secondary to diabetes 06/21/2019   Frequency of urination    History of chronic bronchitis    History of drug abuse (HCC) states quit herion in 2006   History of hepatitis C last abd ultrasound in epic 11-10-2018;  last hepative panel in epic 11-30-2018 was normal   previously followed by [facility] (notes in care everywhere)-- dx 04/ 2015,  started harvonic treatment 07/ 2015   History of rib fracture 10/28/2018   HPV in female 06/11/2016   Hypertension    followed by pcp  (nuclear stress test 01-15-2017 in epic,  showed low risk normal w/ nuclear ef 55%)   Laceration of right foot 03/04/2019   Lipoma of neck 07/16/2017   Seen by general surgery Dr Carolynne Edouard May 16 - plan for removal of 2cm sebaceous cyst.    Methadone maintenance therapy patient (HCC)    Mild asthma    followed by pcp   Nocturia    PAD (peripheral artery disease) (HCC)    followed by cardiology, dr Kirke Corin---   06-01-2019  s/p bilateral CIA angioplasty stenting   Recurrent falls 04/30/2016   Right foot ulcer (HCC)    Symptomatic anemia 11/15/2020   Tendonitis    bilateral wrist   Type 2 diabetes mellitus (HCC)    followed by pcp   (06-16-2019  per pt checks blood sugar every other day in am,  fasting sugar 92-95)   Urge urinary incontinence     Current Outpatient Medications:    albuterol (VENTOLIN HFA) 108 (90 Base) MCG/ACT inhaler, Inhale 2 puffs into the lungs every 6 (six) hours as needed. For wheezing, Disp: 1 each, Rfl: 2   aspirin EC 81 MG tablet, Take 1 tablet (81 mg total) by mouth daily. Swallow whole., Disp: 30 tablet, Rfl: 11   atorvastatin (LIPITOR) 80 MG tablet, Take 1 tablet (80 mg total) by mouth daily., Disp: 30 tablet, Rfl: 11   carvedilol (COREG) 6.25 MG tablet, Take 1 tablet (6.25 mg total) by mouth 2 (two) times daily., Disp: 60 tablet, Rfl: 11   cetirizine (ZYRTEC) 10 MG tablet, Take 1 tablet (10 mg total) by mouth daily., Disp: 30 tablet, Rfl: 11   clopidogrel (PLAVIX) 75 MG tablet, Take 1 tablet (75 mg total) by mouth daily with breakfast., Disp: 90 tablet, Rfl: 3   collagenase (SANTYL) 250 UNIT/GM ointment, Apply 1 Application topically daily., Disp:  15 g, Rfl: 0   diazepam (VALIUM) 5 MG tablet, Take one tablet by mouth with food one hour prior to procedure. May repeat 30 minutes prior if needed., Disp: 2 tablet, Rfl: 0   DULoxetine (CYMBALTA) 20 MG capsule, TAKE THREE CAPSULES (60 MG) BY MOUTH DAILY AT 9AM, Disp: 90 capsule, Rfl: 2   fluticasone (FLONASE) 50 MCG/ACT nasal spray, One spray in each nostril daily, Disp: 16 g, Rfl: 5   GERI-KOT 8.6 MG tablet, TAKE TWO TABLETS BY MOUTH DAILY AT 9AM, Disp: 60 tablet, Rfl: 11   hydrochlorothiazide (HYDRODIURIL) 25 MG tablet, TAKE ONE TABLET (25 MG) BY MOUTH DAILY AT 9AM, Disp: 30 tablet, Rfl: 1   methadone (DOLOPHINE) 10 MG/5ML solution, Take 126 mg by mouth daily. , Disp: , Rfl:    Multiple Vitamins-Minerals (CENTRUM WOMEN) TABS,  Take 1 tablet by mouth daily., Disp: , Rfl:    nicotine (NICODERM CQ - DOSED IN MG/24 HOURS) 21 mg/24hr patch, Place 1 patch (21 mg total) onto the skin daily., Disp: 30 patch, Rfl: 1   nicotine polacrilex (NICORETTE) 4 MG gum, Take 1 each (4 mg total) by mouth as needed for smoking cessation., Disp: 100 tablet, Rfl: 0   Sennosides (SENNA) 8.6 MG CAPS, Take 2 tablets by mouth daily., Disp: 60 capsule, Rfl: 2   SYNJARDY XR 12-998 MG TB24, TAKE ONE TABLET BY MOUTH DAILY AT 9AM, Disp: 60 tablet, Rfl: 1  Social History   Tobacco Use  Smoking Status Some Days   Packs/day: 0.50   Years: 20.00   Additional pack years: 0.00   Total pack years: 10.00   Types: Cigarettes  Smokeless Tobacco Never  Tobacco Comments   SMOKES 4 CIGS PER DAY    Allergies  Allergen Reactions   Lisinopril Swelling    Angioedema   Gabapentin Hives and Other (See Comments)    Only when takes over 300 mg dose. "gives me the shakes"   Vicodin [Hydrocodone-Acetaminophen] Hives, Diarrhea and Other (See Comments)    Stomach  cramps   Hydrocodone-Acetaminophen    Objective:  There were no vitals filed for this visit. There is no height or weight on file to calculate BMI. Constitutional Well developed. Well nourished.  Vascular Dorsalis pedis pulses palpable bilaterally. Posterior tibial pulses palpable bilaterally. Capillary refill normal to all digits.  No cyanosis or clubbing noted. Pedal hair growth normal.  Neurologic Normal speech. Oriented to person, place, and time. Epicritic sensation to light touch grossly present bilaterally.  Dermatologic No further right hallux submetatarsal 1 superficial wound noted.  Wound completely epithelialized.  No signs of recurrence noted.  Orthopedic: Normal joint ROM without pain or crepitus bilaterally. No visible deformities. No bony tenderness.   Radiographs: None Assessment:   No diagnosis found.  Plan:  Patient was evaluated and treated and all questions  answered.  Right submet 1 friction blister -Clinically healed and officially discharged from my care if any foot and ankle issues on for she will come back and see me.  I discussed shoe gear modification and prevention techniques.  She states understanding.  No follow-ups on file.

## 2022-08-04 ENCOUNTER — Telehealth: Payer: Self-pay | Admitting: Physical Medicine and Rehabilitation

## 2022-08-04 NOTE — Telephone Encounter (Signed)
Spoke with patient and rescheduled OV for 08/07/22.

## 2022-08-04 NOTE — Telephone Encounter (Signed)
Patient called. Would like to RSC her appointment with Megan.  

## 2022-08-07 ENCOUNTER — Ambulatory Visit (INDEPENDENT_AMBULATORY_CARE_PROVIDER_SITE_OTHER): Payer: 59 | Admitting: Physical Medicine and Rehabilitation

## 2022-08-07 ENCOUNTER — Encounter: Payer: Self-pay | Admitting: Physical Medicine and Rehabilitation

## 2022-08-07 DIAGNOSIS — G8929 Other chronic pain: Secondary | ICD-10-CM | POA: Diagnosis not present

## 2022-08-07 DIAGNOSIS — R269 Unspecified abnormalities of gait and mobility: Secondary | ICD-10-CM

## 2022-08-07 DIAGNOSIS — M545 Low back pain, unspecified: Secondary | ICD-10-CM

## 2022-08-07 DIAGNOSIS — M47816 Spondylosis without myelopathy or radiculopathy, lumbar region: Secondary | ICD-10-CM

## 2022-08-07 MED ORDER — DIAZEPAM 5 MG PO TABS: ORAL_TABLET | ORAL | 0 refills | Status: DC

## 2022-08-07 NOTE — Progress Notes (Signed)
Rose [name] - 67 y.o. female MRN [id number]  Date of birth: 1956/01/18  Office Visit Note: Visit Date: 08/07/2022 PCP: Willette Cluster, MD Referred by: Willette Cluster, MD  Subjective: Chief Complaint  Patient presents with   Lower Back - Pain   HPI: Rose [name] is a 67 y.o. female who comes in today per the request of Dr. Dickie La for evaluation of chronic, worsening and severe right sided lower back pain. Patient was last seen in our office in 2023. Pain ongoing for several years, worsens with standing, movement and activity. She describes pain as aching and sore sensation, currently rates as 8 out of 10. Some relief of pain with home exercise regimen, heating pad, rest and use of medications. CT lumbar myelogram from 2020 exhibits grade 1 anterolisthesis of L4 on L5 and moderate to severe facet arthropathy at this level. There is also mild to moderate facet and mild ligament flavum hypertrophy at L3-L4 and chronic L5-S1 ankylosis. No high grade spinal canal stenosis noted. She is unable to have MRI imaging due to interstim implant. Patient is currently being managed by Dr. Ellamae Sia at [facility], she continue to take Methadone daily. Patient underwent right L3-L4, L4-L5 and L5-S1 radiofrequency ablation on 12/11/2021, she reports greater than 80% relief of pain for 7 months. She also reports increased functional ability post ablation. Patient currently using walker to assist with ambulation. States she is not interested in chronic pain management, would like to repeat ablation procedure. Patient denies focal weakness, numbness and tingling. No recent trauma or falls.    Oswestry Disability Index Score 50% 20 to 30 (60%) severe disability: Pain remains the main problem in this group but activities of daily living are affected. These patients require a detailed investigation.  Review of Systems  Musculoskeletal:  Positive for back pain.  Neurological:  Negative for tingling,  sensory change, focal weakness and weakness.  All other systems reviewed and are negative.  Otherwise per HPI.  Assessment & Plan: Visit Diagnoses:    ICD-10-CM   1. Chronic right-sided low back pain without sciatica  M54.50 Ambulatory referral to Physical Medicine Rehab   G89.29     2. Spondylosis without myelopathy or radiculopathy, lumbar region  M47.816 Ambulatory referral to Physical Medicine Rehab    3. Facet arthropathy, lumbar  M47.816 Ambulatory referral to Physical Medicine Rehab    4. Abnormality of gait  R26.9 Ambulatory referral to Physical Medicine Rehab       Plan: Findings:  Chronic, worsening and severe right sided axial back pain. Patient continues to have severe pain despite good conservative therapies such as home exercise regimen, rest and use of medications. Patients clinical presentation and exam are consistent with facet mediated pain. She does have pain with lumbar extension upon exam today. There is moderate facet arthropathy at the level of L4-L5. Next step is to repeat right L3-L4, L4-L5 and L5-S1 radiofrequency ablation procedure under fluoroscopic guidance. She did voice concerns with anxiety related to ablation procedure. I did prescribe pre-procedure Valium for her to take on day of procedure. Would also recommend patient re-group with physical therapy as needed. No red flag symptoms noted upon exam today.     Meds & Orders:  Meds ordered this encounter  Medications   diazepam (VALIUM) 5 MG tablet    Sig: Take one tablet by mouth with food one hour prior to procedure. May repeat 30 minutes prior if needed.    Dispense:  2 tablet  Refill:  0    Orders Placed This Encounter  Procedures   Ambulatory referral to Physical Medicine Rehab    Follow-up: Return for Bilateral L3-L4, L4-L5 and L5-S1 radiofrequency ablation.   Procedures: No procedures performed      Clinical History: EXAM: LUMBAR MYELOGRAM   Patient was positioned prone on the  fluoroscopy table. Local anesthesia was provided with 1% lidocaine without epinephrine after prepped and draped in the usual sterile fashion. Puncture was performed at L2-L3 using a 3 1/2 inch 22-gauge spinal needle via left sub laminar approach. Using a single pass through the dura, the needle was placed within the thecal sac, with return of clear CSF. 13 milliliters of Isovue M-200 was injected into the thecal sac, with normal opacification of the nerve roots and cauda equina consistent with free flow within the subarachnoid space.   I personally performed the lumbar puncture and administered the intrathecal contrast. I also personally supervised acquisition of the myelogram images.   TECHNIQUE: Contiguous axial images were obtained through the Lumbar spine after the intrathecal infusion of infusion. Coronal and sagittal reconstructions were obtained of the axial image sets.   COMPARISON:  Lumbar MRI 12/08/2008. Thoracolumbar radiographs 10/09/2005.   FINDINGS: LUMBAR MYELOGRAM FINDINGS:   Chronic or congenital ankylosis of L5-S1 demonstrated on the prior studies. Otherwise normal lumbar segmentation, lowest ribs at T12.   Patent lumbar and lower thoracic thecal sac with good intrathecal contrast. Small defects on the ventral thecal sac L2-L3 through L4-L5. No lateral recess stenosis is evident. When prone mildly exaggerated lumbar lordosis resulted in decreased contrast at the L5-S1 level.   Lateral decubitus and upright lateral images were obtained. There is mild grade 1 anterolisthesis of L4 on L5 apparent when upright. Upright flexion and extension views were obtained but no abnormal motion identified. Calcified aortic atherosclerosis.   CT LUMBAR MYELOGRAM FINDINGS:   Negative visible costophrenic angle. Partially visible evidence of extensive cholelithiasis in a distended gallbladder (series 5, image 40), and layering in the more normal appearing gallbladder  neck (series 4, image 33). Otherwise negative visible noncontrast abdominal viscera. Aortoiliac calcified atherosclerosis.   Partially visible right side sacral spinal stimulator device.   Stable vertebral height and alignment since 2010 with no L4-L5 spondylolisthesis evident on these images, but there is vacuum facet phenomena at that level, see additional details below. No acute osseous abnormality identified. Intact visible sacrum and SI joints.   Normal myelographic appearance of the lower thoracic spinal cord with conus at L1-L2. No lower thoracic spinal stenosis. Cauda equina nerve roots appear normal.   T12-L1:  Mild facet hypertrophy.  No stenosis.   L1-L2:  Negative.   L2-L3: Mild circumferential disc bulge. Mild facet and ligament flavum hypertrophy. Mild left L2 neural foraminal stenosis.   L3-L4: Mild circumferential disc bulge. Mild to moderate facet and mild ligament flavum hypertrophy. No spinal or lateral recess stenosis. Mild L3 neural foraminal stenosis which appears greater on the left.   L4-L5: Mild circumferential disc bulge. Moderate to severe facet hypertrophy with vacuum facet, greater on the left. Only mild ligament flavum hypertrophy. No spinal or lateral recess stenosis. Mild bilateral L4 foraminal stenosis appears fairly symmetric.   L5-S1:  Chronically ankylosed.  No stenosis.   IMPRESSION: 1. No lumbar spinal stenosis or convincing right side neural impingement, but there is evidence of mild grade 1 anterolisthesis of L4 on L5 when upright, and moderate to severe facet arthropathy at that level. So perhaps L4-L5 is the symptomatic level. And  there is underlying chronic L5-S1 ankylosis. Mild lumbar disc bulging. Up to mild bilateral L2 through L4 neural foraminal stenosis. 2. Cholelithiasis which may be severe and/or associated with porcelain gallbladder. Consider right upper quadrant ultrasound or CT abdomen. 3. Partially visible sacral  stimulator device. 4.  Aortic Atherosclerosis (ICD10-I70.0).   On: 08/30/2018 11:11   She reports that she has been smoking cigarettes. She has a 10.00 pack-year smoking history. She has never used smokeless tobacco.  Recent Labs    10/14/21 0952 03/26/22 0917 06/23/22 1408  HGBA1C 6.2* 6.8* 6.3*    Objective:  VS:  HT:    WT:   BMI:     BP:   HR: bpm  TEMP: ( )  RESP:  Physical Exam Vitals and nursing note reviewed.  HENT:     Head: Normocephalic and atraumatic.     Right Ear: External ear normal.     Left Ear: External ear normal.     Nose: Nose normal.     Mouth/Throat:     Mouth: Mucous membranes are moist.  Eyes:     Extraocular Movements: Extraocular movements intact.  Cardiovascular:     Rate and Rhythm: Normal rate.  Pulmonary:     Effort: Pulmonary effort is normal.  Abdominal:     General: Abdomen is flat. There is no distension.  Musculoskeletal:        General: Tenderness present.     Cervical back: Normal range of motion.     Comments: Patient rises from seated position to standing without difficulty. Concordant low back pain with facet loading, lumbar spine extension and rotation. 5/5 strength noted with bilateral hip flexion, knee flexion/extension, ankle dorsiflexion/plantarflexion and EHL. No clonus noted bilaterally. No pain upon palpation of greater trochanters. No pain with internal/external rotation of bilateral hips. Sensation intact bilaterally. Negative slump test bilaterally. Ambulates with cane, gait slow and unsteady.     Skin:    General: Skin is warm and dry.     Capillary Refill: Capillary refill takes less than 2 seconds.  Neurological:     General: No focal deficit present.     Mental Status: She is alert and oriented to person, place, and time.  Psychiatric:        Mood and Affect: Mood normal.        Behavior: Behavior normal.     Ortho Exam  Imaging: No results found.  Past Medical/Family/Surgical/Social  History: Medications & Allergies reviewed per EMR, new medications updated. Patient Active Problem List   Diagnosis Date Noted   Wound of right foot 03/26/2022   Tension headache 12/10/2021   Therapeutic opioid-induced constipation (OIC) 12/10/2021   Fall 07/01/2021   Iron deficiency anemia 11/15/2020   Hepatic steatosis    CAD s/p DES to LAD (12/2019) 01/17/2020   PAD s/p PCI with stent 08/18/2019   Allergic rhinitis 10/28/2018   Diabetic neuropathy (HCC) 06/02/2017   Hyperlipidemia associated with type 2 diabetes mellitus (HCC) 12/13/2015   Tobacco use disorder 12/13/2015   Chronic renal insufficiency 12/12/2015   Depression 12/12/2015   Methadone maintenance therapy patient (HCC)    History of substance use disorder    Chronic back pain    Preventative health care 12/14/2014   Type 2 diabetes mellitus (HCC) 02/27/2012   Essential hypertension 02/27/2012   Past Medical History:  Diagnosis Date   ACE inhibitor-aggravated angioedema, initial encounter 04/26/2016   Allergic rhinitis    Anxiety    Arthritis    Calculus of gallbladder  without cholecystitis without obstruction 10/28/2018   Chronic back pain    CKD (chronic kidney disease), stage III (HCC)    folllowed by pcp   Coronary artery disease    Depression    Diabetic neuropathy (HCC)    Edema of lower extremity 10/28/2018   Encounter for completion of form with patient 06/21/2019   Environmental allergies    Environmental allergies 12/28/2015   Fibromyalgia    Foot ulcer secondary to diabetes 06/21/2019   Frequency of urination    History of chronic bronchitis    History of drug abuse (HCC) states quit herion in 2006   History of hepatitis C last abd ultrasound in epic 11-10-2018;  last hepative panel in epic 11-30-2018 was normal   previously followed by [facility] (notes in care everywhere)-- dx 04/ 2015,  started harvonic treatment 07/ 2015   History of rib fracture 10/28/2018   HPV in female  06/11/2016   Hypertension    followed by pcp  (nuclear stress test 01-15-2017 in epic,  showed low risk normal w/ nuclear ef 55%)   Laceration of right foot 03/04/2019   Lipoma of neck 07/16/2017   Seen by general surgery Dr Carolynne Edouard May 16 - plan for removal of 2cm sebaceous cyst.    Methadone maintenance therapy patient (HCC)    Mild asthma    followed by pcp   Nocturia    PAD (peripheral artery disease) (HCC)    followed by cardiology, dr Kirke Corin---  06-01-2019  s/p bilateral CIA angioplasty stenting   Recurrent falls 04/30/2016   Right foot ulcer (HCC)    Symptomatic anemia 11/15/2020   Tendonitis    bilateral wrist   Type 2 diabetes mellitus (HCC)    followed by pcp   (06-16-2019  per pt checks blood sugar every other day in am,  fasting sugar 92-95)   Urge urinary incontinence    Family History  Problem Relation Age of Onset   Hypertension Mother    Diabetes type II Mother    Heart attack Mother 13   CAD Father    Heart attack Father 40   Hypertension Brother    Diabetes type II Brother    Heart attack Brother 35   Diabetes type II Sister    Past Surgical History:  Procedure Laterality Date   ABDOMINAL AORTOGRAM W/LOWER EXTREMITY Bilateral 06/01/2019   Procedure: ABDOMINAL AORTOGRAM W/LOWER EXTREMITY;  Surgeon: [name], MD;  Location: [facility];  Service: Cardiovascular;  Laterality: Bilateral;   ABDOMINAL AORTOGRAM W/LOWER EXTREMITY N/A 06/04/2022   Procedure: ABDOMINAL AORTOGRAM W/LOWER EXTREMITY;  Surgeon: [name], MD;  Location: [facility];  Service: Cardiovascular;  Laterality: N/A;   APPENDECTOMY  1972   INTERSTIM IMPLANT PLACEMENT  10/23/2011   Procedure: Leane Platt IMPLANT FIRST STAGE;  Surgeon: Martina Sinner, MD;  Location: [facility];  Service: Urology;  Laterality: N/A;   INTERSTIM IMPLANT PLACEMENT  10/23/2011   Procedure: Leane Platt IMPLANT SECOND STAGE;  Surgeon: Martina Sinner, MD;  Location: [facility];  Service: Urology;  Laterality: N/A;   LEFT HEART CATH AND CORONARY ANGIOGRAPHY N/A 01/11/2020   Procedure: LEFT HEART CATH AND CORONARY ANGIOGRAPHY;  Surgeon: [name], MD;  Location: [facility];  Service: Cardiovascular;  Laterality: N/A;   PANACOS TISSUE GRAFT Right 06/24/2019   Procedure: Application of Skin Graft Substitute;  Surgeon: Park Liter, DPM;  Location: [facility];  Service: Podiatry;  Laterality: Right;   PERIPHERAL VASCULAR INTERVENTION Bilateral 06/01/2019   Procedure: PERIPHERAL VASCULAR INTERVENTION;  Surgeon: [name], MD;  Location: [facility];  Service: Cardiovascular;  Laterality: Bilateral;   WOUND DEBRIDEMENT Right 06/24/2019   Procedure: DEBRIDEMENT WOUND;  Surgeon: Park Liter, DPM;  Location: [facility];  Service: Podiatry;  Laterality: Right;   WOUND DEBRIDEMENT Right 08/24/2019   Procedure: DEBRIDEMENT RIGHT FOOT WOUND; APPLICATION OF SKIN GRAFT SUBSTITUTE;  Surgeon: Park Liter, DPM;  Location: [facility];  Service: Podiatry;  Laterality: Right;   Social History   Occupational History   Not on file  Tobacco Use   Smoking status: Some Days    Packs/day: 0.50    Years: 20.00    Additional pack years: 0.00    Total pack years: 10.00    Types: Cigarettes   Smokeless tobacco: Never   Tobacco comments:    SMOKES 4 CIGS PER DAY  Vaping Use   Vaping Use: Never used  Substance and Sexual Activity   Alcohol use: No    Alcohol/week: 0.0 standard drinks of alcohol   Drug use: Not Currently    Comment: hx heroin use, IV,  last used 2006 (currently on methadone)   Sexual activity: Not Currently

## 2022-08-07 NOTE — Progress Notes (Signed)
Functional Pain Scale - descriptive words and definitions  Distracting (5)    Aware of pain/able to complete some ADL's but limited by pain/sleep is affected and active distractions are only slightly useful. Moderate range order  Average Pain  varies  Lower back pain all the way across that can radiate in the hips.

## 2022-08-12 ENCOUNTER — Other Ambulatory Visit: Payer: Self-pay

## 2022-08-12 DIAGNOSIS — Z1231 Encounter for screening mammogram for malignant neoplasm of breast: Secondary | ICD-10-CM

## 2022-08-27 ENCOUNTER — Other Ambulatory Visit: Payer: Self-pay

## 2022-08-27 ENCOUNTER — Emergency Department (HOSPITAL_COMMUNITY)
Admission: EM | Admit: 2022-08-27 | Discharge: 2022-08-27 | Disposition: A | Discharge status: Home / Self Care | Payer: 59 | Attending: Emergency Medicine | Admitting: Emergency Medicine

## 2022-08-27 ENCOUNTER — Ambulatory Visit (INDEPENDENT_AMBULATORY_CARE_PROVIDER_SITE_OTHER): Payer: 59 | Admitting: Physical Medicine and Rehabilitation

## 2022-08-27 VITALS — BP 121/66 | HR 86

## 2022-08-27 DIAGNOSIS — Z7982 Long term (current) use of aspirin: Secondary | ICD-10-CM | POA: Diagnosis not present

## 2022-08-27 DIAGNOSIS — G8929 Other chronic pain: Secondary | ICD-10-CM

## 2022-08-27 DIAGNOSIS — Z7902 Long term (current) use of antithrombotics/antiplatelets: Secondary | ICD-10-CM | POA: Insufficient documentation

## 2022-08-27 DIAGNOSIS — R58 Hemorrhage, not elsewhere classified: Secondary | ICD-10-CM

## 2022-08-27 DIAGNOSIS — L7622 Postprocedural hemorrhage and hematoma of skin and subcutaneous tissue following other procedure: Secondary | ICD-10-CM | POA: Diagnosis not present

## 2022-08-27 DIAGNOSIS — Z743 Need for continuous supervision: Secondary | ICD-10-CM | POA: Diagnosis not present

## 2022-08-27 DIAGNOSIS — M545 Low back pain, unspecified: Secondary | ICD-10-CM | POA: Diagnosis not present

## 2022-08-27 DIAGNOSIS — Z79899 Other long term (current) drug therapy: Secondary | ICD-10-CM | POA: Diagnosis not present

## 2022-08-27 DIAGNOSIS — M47816 Spondylosis without myelopathy or radiculopathy, lumbar region: Secondary | ICD-10-CM | POA: Diagnosis not present

## 2022-08-27 DIAGNOSIS — I2510 Atherosclerotic heart disease of native coronary artery without angina pectoris: Secondary | ICD-10-CM | POA: Diagnosis not present

## 2022-08-27 DIAGNOSIS — R6889 Other general symptoms and signs: Secondary | ICD-10-CM | POA: Diagnosis not present

## 2022-08-27 DIAGNOSIS — X58XXXA Exposure to other specified factors, initial encounter: Secondary | ICD-10-CM | POA: Insufficient documentation

## 2022-08-27 DIAGNOSIS — S31030A Puncture wound without foreign body of lower back and pelvis without penetration into retroperitoneum, initial encounter: Secondary | ICD-10-CM | POA: Insufficient documentation

## 2022-08-27 MED ORDER — METHYLPREDNISOLONE ACETATE 80 MG/ML IJ SUSP
80.0000 mg | Freq: Once | INTRAMUSCULAR | Status: AC
  Administered 2022-08-27: 80 mg

## 2022-08-27 MED ORDER — LIDOCAINE-EPINEPHRINE (PF) 2 %-1:200000 IJ SOLN
10.0000 mL | Freq: Once | INTRAMUSCULAR | Status: AC
  Administered 2022-08-27: 10 mL via INTRADERMAL
  Filled 2022-08-27: qty 20

## 2022-08-27 NOTE — ED Provider Notes (Signed)
[place] EMERGENCY DEPARTMENT AT [facility] Provider Note   CSN: [id number] Arrival date & time: 08/27/22  2227     History  Chief Complaint  Patient presents with   Bleeding at injection site    Rose [name] is a 67 y.o. female.  68 year old female with a history of CAD on aspirin and Plavix and back pain who presents emergency department with bleeding from injection site.  Reports that she has chronic back pain and had a steroid injection in the right lower back today.  Says that this happened at approximately 3 PM and she went home and was feeling fine but noticed that her shirt was wet.  She looked down and it was soaked with blood.  Tried holding pressure but was unable to control the bleeding.  EMS came and put a pressure dressing on and brought her to the emergency department.  No chest pain, shortness of breath, or dizziness at this time.  Denies any significant back pain, lower extremity weakness or numbness or bowel or bladder incontinence.       Home Medications Prior to Admission medications   Medication Sig Start Date End Date Taking? Authorizing Provider  albuterol (VENTOLIN HFA) 108 (90 Base) MCG/ACT inhaler Inhale 2 puffs into the lungs every 6 (six) hours as needed. For wheezing 12/04/20   Dellia Cloud, MD  aspirin EC 81 MG tablet Take 1 tablet (81 mg total) by mouth daily. Swallow whole. 06/23/22 06/23/23  Adron Bene, MD  atorvastatin (LIPITOR) 80 MG tablet Take 1 tablet (80 mg total) by mouth daily. 03/03/22   Willette Cluster, MD  carvedilol (COREG) 6.25 MG tablet Take 1 tablet (6.25 mg total) by mouth 2 (two) times daily. 03/03/22 03/03/23  Willette Cluster, MD  cetirizine (ZYRTEC) 10 MG tablet Take 1 tablet (10 mg total) by mouth daily. 12/20/21   Masters, Florentina Addison, DO  clopidogrel (PLAVIX) 75 MG tablet Take 1 tablet (75 mg total) by mouth daily with breakfast. 12/10/21   Marrianne Mood, MD  collagenase (SANTYL) 250 UNIT/GM ointment Apply 1  Application topically daily. 06/11/22   Candelaria Stagers, DPM  diazepam (VALIUM) 5 MG tablet Take one tablet by mouth with food one hour prior to procedure. May repeat 30 minutes prior if needed. 08/07/22   Juanda Chance, NP  DULoxetine (CYMBALTA) 20 MG capsule TAKE THREE CAPSULES (60 MG) BY MOUTH DAILY AT 9AM 05/28/22   Willette Cluster, MD  fluticasone [facility]) 50 MCG/ACT nasal spray One spray in each nostril daily 12/10/21   Marrianne Mood, MD  GERI-KOT 8.6 MG tablet TAKE TWO TABLETS BY MOUTH DAILY AT 9AM 06/04/22   Willette Cluster, MD  hydrochlorothiazide (HYDRODIURIL) 25 MG tablet TAKE ONE TABLET (25 MG) BY MOUTH DAILY AT 9AM 07/30/22   Willette Cluster, MD  methadone (DOLOPHINE) 10 MG/5ML solution Take 126 mg by mouth daily.     [provider]  Multiple Vitamins-Minerals (CENTRUM WOMEN) TABS Take 1 tablet by mouth daily.    [provider]  nicotine polacrilex (NICORETTE) 4 MG gum Take 1 each (4 mg total) by mouth as needed for smoking cessation. 06/23/22   Adron Bene, MD  Sennosides (SENNA) 8.6 MG CAPS Take 2 tablets by mouth daily. 12/10/21   Marrianne Mood, MD  SYNJARDY XR 12-998 MG TB24 TAKE ONE TABLET BY MOUTH DAILY AT 9AM 07/30/22   Willette Cluster, MD      Allergies    Lisinopril, Gabapentin, Vicodin [hydrocodone-acetaminophen], and Hydrocodone-acetaminophen    Review of  Systems   Review of Systems  Physical Exam Updated Vital Signs There were no vitals taken for this visit. Physical Exam Vitals and nursing note reviewed.  Constitutional:      General: She is not in acute distress.    Appearance: She is well-developed.  HENT:     Head: Normocephalic and atraumatic.     Right Ear: External ear normal.     Left Ear: External ear normal.     Nose: Nose normal.  Eyes:     Extraocular Movements: Extraocular movements intact.     Conjunctiva/sclera: Conjunctivae normal.     Pupils: Pupils are equal, round, and reactive to light.  Pulmonary:     Effort:  Pulmonary effort is normal. No respiratory distress.  Musculoskeletal:     Cervical back: Normal range of motion and neck supple.     Right lower leg: No edema.     Left lower leg: No edema.     Comments: Small needle puncture wound to lateral back on right side at approximately L5 level that is oozing a small amount of blood.  Full strength in bilateral lower extremities.  Skin:    General: Skin is warm and dry.  Neurological:     Mental Status: She is alert and oriented to person, place, and time. Mental status is at baseline.  Psychiatric:        Mood and Affect: Mood normal.     ED Results / Procedures / Treatments   Labs (all labs ordered are listed, but only abnormal results are displayed) Labs Reviewed - No data to display  EKG None  Radiology No results found.  Procedures .Marland KitchenLaceration Repair  Date/Time: 08/27/2022 11:37 PM  Performed by: Rondel Baton, MD Authorized by: Rondel Baton, MD   Consent:    Consent obtained:  Verbal   Risks discussed:  Infection, pain and need for additional repair Laceration details:    Location:  Trunk   Trunk location:  R flank   Length (cm):  0.1 Pre-procedure details:    Preparation:  Patient was prepped and draped in usual sterile fashion Skin repair:    Repair method:  Sutures   Suture size:  4-0   Suture material:  Plain gut   Suture technique:  Figure eight   Number of sutures:  1 Repair type:    Repair type:  Simple Post-procedure details:    Dressing:  Adhesive bandage   Procedure completion:  Tolerated well, no immediate complications     Medications Ordered in ED Medications  lidocaine-EPINEPHrine (XYLOCAINE W/EPI) 2 %-1:200000 (PF) injection 10 mL (10 mLs Intradermal Given 08/27/22 2304)    ED Course/ Medical Decision Making/ A&P                             Medical Decision Making Risk Prescription drug management.   Rose [name] is a 67 y.o. female with comorbidities that  complicate the patient evaluation including CAD on aspirin and Plavix and chronic lower back pain who presents with bleeding from the injection site today  Initial Ddx:  Bleeding, symptomatic anemia, spinal epidural hematoma  MDM:  Feel the patient has mild amount of bleeding from her side.  Not appear to be hemorrhaging at this time but did have difficulty controlling it so we will throw a stitch in the bleeding area.  No symptoms of anemia at this time.  No neuro symptoms of be concerning for  spinal epidural hematoma that may have formed after the injection today.  Plan:  Lidocaine Figure-of-eight stitch  ED Summary/Re-evaluation:  Figure-of-eight stitch placed and hemostasis was obtained.  Band-Aid placed on the patient's site.  Observable sutures were placed and patient was counseled on wound care and instructed to return should the bleeding resume again.  This patient presents to the ED for concern of complaints listed in HPI, this involves an extensive number of treatment options, and is a complaint that carries with it a high risk of complications and morbidity. Disposition including potential need for admission considered.   Dispo: DC Home. Return precautions discussed including, but not limited to, those listed in the AVS. Allowed pt time to ask questions which were answered fully prior to dc.  Records reviewed Outpatient Clinic Notes I have reviewed the patients home medications and made adjustments as needed        Final Clinical Impression(s) / ED Diagnoses Final diagnoses:  Bleeding    Rx / DC Orders ED Discharge Orders     None         Rondel Baton, MD 08/27/22 2338

## 2022-08-27 NOTE — ED Triage Notes (Signed)
Patient arrived with EMS from home reports bleeding at injection site at right lower back ( Steroid) this afternoon , pressure dressing applied prior to arrival , she takes Plavix.

## 2022-08-27 NOTE — Discharge Instructions (Signed)
You were seen for the bleeding from your injection site in the emergency department.  You had absorbable sutures that were placed.  At home, please keep the area dry until the stitches fall out in 5 to 7 days  Check your MyChart online for the results of any tests that had not resulted by the time you left the emergency department.   Follow-up with your primary doctor in 2-3 days regarding your visit.    Return immediately to the emergency department if you experience any of the following: Bleeding, pain, fevers, or any other concerning symptoms.    Thank you for visiting our Emergency Department. It was a pleasure taking care of you today.

## 2022-08-27 NOTE — Patient Instructions (Signed)

## 2022-08-27 NOTE — Progress Notes (Unsigned)
Functional Pain Scale - descriptive words and definitions  Unmanageable (7)  Pain interferes with normal ADL's/nothing seems to help/sleep is very difficult/active distractions are very difficult to concentrate on. Severe range order  Average Pain 6   +Driver, -BT, -Dye Allergies.  Lower back pain on right side

## 2022-08-28 ENCOUNTER — Telehealth: Payer: Self-pay

## 2022-08-28 NOTE — Procedures (Signed)
Lumbar Facet Joint Nerve Denervation  Patient: Rose [name]      Date of Birth: 27-May-1955 MRN: [id number] PCP: Willette Cluster, MD      Visit Date: 08/27/2022   Universal Protocol:    Date/Time: 05/30/245:32 AM  Consent Given By: the patient  Position: PRONE  Additional Comments: Vital signs were monitored before and after the procedure. Patient was prepped and draped in the usual sterile fashion. The correct patient, procedure, and site was verified.   Injection Procedure Details:   Procedure diagnoses:  1. Spondylosis without myelopathy or radiculopathy, lumbar region   2. Chronic right-sided low back pain without sciatica      Meds Administered:  Meds ordered this encounter  Medications   methylPREDNISolone acetate (DEPO-MEDROL) injection 80 mg     Laterality: Right  Location/Site:  L3-L4, L2 and L3 medial branches, L4-L5, L3 and L4 medial branches, and L5-S1, L4 medial branch and L5 dorsal ramus  Needle: 18 ga.,  10mm active tip, RF Cannula  Needle Placement: Along juncture of superior articular process and transverse pocess  Findings:  -Comments:  Procedure Details: For each desired target nerve, the corresponding transverse process (sacral ala for the L5 dorsal rami) was identified and the fluoroscope was positioned to square off the endplates of the corresponding vertebral body to achieve a true AP midline view.  The beam was then obliqued 15 to 20 degrees and caudally tilted 15 to 20 degrees to line up a trajectory along the target nerves. The skin over the target of the junction of superior articulating process and transverse process (sacral ala for the L5 dorsal rami) was infiltrated with 1ml of 1% Lidocaine without Epinephrine.  The 18 gauge 10mm active tip outer cannula was advanced in trajectory view to the target.  This procedure was repeated for each target nerve.  Then, for all levels, the outer cannula placement was fine-tuned and the  position was then confirmed with bi-planar imaging.    Test stimulation was done both at sensory and motor levels to ensure there was no radicular stimulation. The target tissues were then infiltrated with 1 ml of 1% Lidocaine without Epinephrine. Subsequently, a percutaneous neurotomy was carried out for 90 seconds at 80 degrees Celsius.  After the completion of the lesion, 1 ml of injectate was delivered. It was then repeated for each facet joint nerve mentioned above. Appropriate radiographs were obtained to verify the probe placement during the neurotomy.   Additional Comments:  The patient tolerated the procedure well Dressing: 2 x 2 sterile gauze and Band-Aid    Post-procedure details: Patient was observed during the procedure. Post-procedure instructions were reviewed.  Patient left the clinic in stable condition.

## 2022-08-28 NOTE — Telephone Encounter (Signed)
Patients daughter Rayfield Citizen called stating that patient was rushed to the ED on last night due to injection site bleeding and had to receive stitches.  Would like a call back?  Cb# [phone].  Please advise.  Thank you.

## 2022-08-28 NOTE — Progress Notes (Signed)
Rose [name] - 67 y.o. female MRN [id number]  Date of birth: Feb 16, 1956  Office Visit Note: Visit Date: 08/27/2022 PCP: Willette Cluster, MD Referred by: Willette Cluster, MD  Subjective: Chief Complaint  Patient presents with   Lower Back - Pain   HPI:  Rose [name] is a 67 y.o. female who comes in todayfor planned repeat radiofrequency ablation of the Right L3-4, L4-5, and L5-S1  Lumbar facet joints. This would be ablation of the corresponding medial branches and/or dorsal rami.  Patient has had double diagnostic blocks with more than 70% relief.  Subsequent ablation gave them more than 6 months of over 60% relief.  They have had chronic back pain for quite some time, more than 3 months, which has been an ongoing situation with recalcitrant axial back pain.  They have no radicular pain.  Their axial pain is worse with standing and ambulating and on exam today with facet loading.  They have had physical therapy as well as home exercise program.  The imaging noted in the chart below indicated facet pathology. Accordingly they meet all the criteria and qualification for for radiofrequency ablation and we are going to complete this today hopefully for more longer term relief as part of comprehensive management program.   ROS Otherwise per HPI.  Assessment & Plan: Visit Diagnoses:    ICD-10-CM   1. Spondylosis without myelopathy or radiculopathy, lumbar region  M47.816 XR C-ARM NO REPORT    Radiofrequency,Lumbar    methylPREDNISolone acetate (DEPO-MEDROL) injection 80 mg    2. Chronic right-sided low back pain without sciatica  M54.50 XR C-ARM NO REPORT   G89.29 Radiofrequency,Lumbar    methylPREDNISolone acetate (DEPO-MEDROL) injection 80 mg      Plan: No additional findings.   Meds & Orders:  Meds ordered this encounter  Medications   methylPREDNISolone acetate (DEPO-MEDROL) injection 80 mg    Orders Placed This Encounter  Procedures   Radiofrequency,Lumbar    XR C-ARM NO REPORT    Follow-up: Return for visit to requesting provider as needed.   Procedures: No procedures performed  Lumbar Facet Joint Nerve Denervation  Patient: Rose [name]      Date of Birth: Jun 09, 1955 MRN: [id number] PCP: Willette Cluster, MD      Visit Date: 08/27/2022   Universal Protocol:    Date/Time: 05/30/245:32 AM  Consent Given By: the patient  Position: PRONE  Additional Comments: Vital signs were monitored before and after the procedure. Patient was prepped and draped in the usual sterile fashion. The correct patient, procedure, and site was verified.   Injection Procedure Details:   Procedure diagnoses:  1. Spondylosis without myelopathy or radiculopathy, lumbar region   2. Chronic right-sided low back pain without sciatica      Meds Administered:  Meds ordered this encounter  Medications   methylPREDNISolone acetate (DEPO-MEDROL) injection 80 mg     Laterality: Right  Location/Site:  L3-L4, L2 and L3 medial branches, L4-L5, L3 and L4 medial branches, and L5-S1, L4 medial branch and L5 dorsal ramus  Needle: 18 ga.,  10mm active tip, RF Cannula  Needle Placement: Along juncture of superior articular process and transverse pocess  Findings:  -Comments:  Procedure Details: For each desired target nerve, the corresponding transverse process (sacral ala for the L5 dorsal rami) was identified and the fluoroscope was positioned to square off the endplates of the corresponding vertebral body to achieve a true AP midline view.  The beam was then obliqued 15 to 20  degrees and caudally tilted 15 to 20 degrees to line up a trajectory along the target nerves. The skin over the target of the junction of superior articulating process and transverse process (sacral ala for the L5 dorsal rami) was infiltrated with 1ml of 1% Lidocaine without Epinephrine.  The 18 gauge 10mm active tip outer cannula was advanced in trajectory view to the  target.  This procedure was repeated for each target nerve.  Then, for all levels, the outer cannula placement was fine-tuned and the position was then confirmed with bi-planar imaging.    Test stimulation was done both at sensory and motor levels to ensure there was no radicular stimulation. The target tissues were then infiltrated with 1 ml of 1% Lidocaine without Epinephrine. Subsequently, a percutaneous neurotomy was carried out for 90 seconds at 80 degrees Celsius.  After the completion of the lesion, 1 ml of injectate was delivered. It was then repeated for each facet joint nerve mentioned above. Appropriate radiographs were obtained to verify the probe placement during the neurotomy.   Additional Comments:  The patient tolerated the procedure well Dressing: 2 x 2 sterile gauze and Band-Aid    Post-procedure details: Patient was observed during the procedure. Post-procedure instructions were reviewed.  Patient left the clinic in stable condition.      Clinical History: EXAM: LUMBAR MYELOGRAM   Patient was positioned prone on the fluoroscopy table. Local anesthesia was provided with 1% lidocaine without epinephrine after prepped and draped in the usual sterile fashion. Puncture was performed at L2-L3 using a 3 1/2 inch 22-gauge spinal needle via left sub laminar approach. Using a single pass through the dura, the needle was placed within the thecal sac, with return of clear CSF. 13 milliliters of Isovue M-200 was injected into the thecal sac, with normal opacification of the nerve roots and cauda equina consistent with free flow within the subarachnoid space.   I personally performed the lumbar puncture and administered the intrathecal contrast. I also personally supervised acquisition of the myelogram images.   TECHNIQUE: Contiguous axial images were obtained through the Lumbar spine after the intrathecal infusion of infusion. Coronal and sagittal reconstructions were  obtained of the axial image sets.   COMPARISON:  Lumbar MRI 12/08/2008. Thoracolumbar radiographs 10/09/2005.   FINDINGS: LUMBAR MYELOGRAM FINDINGS:   Chronic or congenital ankylosis of L5-S1 demonstrated on the prior studies. Otherwise normal lumbar segmentation, lowest ribs at T12.   Patent lumbar and lower thoracic thecal sac with good intrathecal contrast. Small defects on the ventral thecal sac L2-L3 through L4-L5. No lateral recess stenosis is evident. When prone mildly exaggerated lumbar lordosis resulted in decreased contrast at the L5-S1 level.   Lateral decubitus and upright lateral images were obtained. There is mild grade 1 anterolisthesis of L4 on L5 apparent when upright. Upright flexion and extension views were obtained but no abnormal motion identified. Calcified aortic atherosclerosis.   CT LUMBAR MYELOGRAM FINDINGS:   Negative visible costophrenic angle. Partially visible evidence of extensive cholelithiasis in a distended gallbladder (series 5, image 40), and layering in the more normal appearing gallbladder neck (series 4, image 33). Otherwise negative visible noncontrast abdominal viscera. Aortoiliac calcified atherosclerosis.   Partially visible right side sacral spinal stimulator device.   Stable vertebral height and alignment since 2010 with no L4-L5 spondylolisthesis evident on these images, but there is vacuum facet phenomena at that level, see additional details below. No acute osseous abnormality identified. Intact visible sacrum and SI joints.   Normal myelographic appearance  of the lower thoracic spinal cord with conus at L1-L2. No lower thoracic spinal stenosis. Cauda equina nerve roots appear normal.   T12-L1:  Mild facet hypertrophy.  No stenosis.   L1-L2:  Negative.   L2-L3: Mild circumferential disc bulge. Mild facet and ligament flavum hypertrophy. Mild left L2 neural foraminal stenosis.   L3-L4: Mild circumferential disc bulge. Mild  to moderate facet and mild ligament flavum hypertrophy. No spinal or lateral recess stenosis. Mild L3 neural foraminal stenosis which appears greater on the left.   L4-L5: Mild circumferential disc bulge. Moderate to severe facet hypertrophy with vacuum facet, greater on the left. Only mild ligament flavum hypertrophy. No spinal or lateral recess stenosis. Mild bilateral L4 foraminal stenosis appears fairly symmetric.   L5-S1:  Chronically ankylosed.  No stenosis.   IMPRESSION: 1. No lumbar spinal stenosis or convincing right side neural impingement, but there is evidence of mild grade 1 anterolisthesis of L4 on L5 when upright, and moderate to severe facet arthropathy at that level. So perhaps L4-L5 is the symptomatic level. And there is underlying chronic L5-S1 ankylosis. Mild lumbar disc bulging. Up to mild bilateral L2 through L4 neural foraminal stenosis. 2. Cholelithiasis which may be severe and/or associated with porcelain gallbladder. Consider right upper quadrant ultrasound or CT abdomen. 3. Partially visible sacral stimulator device. 4.  Aortic Atherosclerosis (ICD10-I70.0).   On: 08/30/2018 11:11     Objective:  VS:  HT:    WT:   BMI:     BP:121/66  HR:86bpm  TEMP: ( )  RESP:  Physical Exam Vitals and nursing note reviewed.  Constitutional:      General: She is not in acute distress.    Appearance: Normal appearance. She is not ill-appearing.  HENT:     Head: Normocephalic and atraumatic.     Right Ear: External ear normal.     Left Ear: External ear normal.  Eyes:     Extraocular Movements: Extraocular movements intact.  Cardiovascular:     Rate and Rhythm: Normal rate.     Pulses: Normal pulses.  Pulmonary:     Effort: Pulmonary effort is normal. No respiratory distress.  Abdominal:     General: There is no distension.     Palpations: Abdomen is soft.  Musculoskeletal:        General: Tenderness present.     Cervical back: Neck supple.      Right lower leg: No edema.     Left lower leg: No edema.     Comments: Patient has good distal strength with no pain over the greater trochanters.  No clonus or focal weakness.  Skin:    Findings: No erythema, lesion or rash.  Neurological:     General: No focal deficit present.     Mental Status: She is alert and oriented to person, place, and time.     Sensory: No sensory deficit.     Motor: No weakness or abnormal muscle tone.     Coordination: Coordination normal.  Psychiatric:        Mood and Affect: Mood normal.        Behavior: Behavior normal.      Imaging: No results found.

## 2022-09-01 ENCOUNTER — Ambulatory Visit: Payer: 59

## 2022-09-03 ENCOUNTER — Telehealth: Payer: Self-pay

## 2022-09-03 ENCOUNTER — Encounter: Payer: 59 | Admitting: Physical Medicine and Rehabilitation

## 2022-09-03 NOTE — Transitions of Care (Post Inpatient/ED Visit) (Signed)
09/03/2022  Name: Rose [name] MRN: [id number] DOB: 1956-01-10  Today's TOC FU Call Status: Today's TOC FU Call Status:: Successful TOC FU Call Competed TOC FU Call Complete Date: 09/03/22  Transition Care Management Follow-up Telephone Call Date of Discharge: 08/27/22 Discharge Facility: [facility]) Type of Discharge: Emergency Department Reason for ED Visit: Other: ("bleeding from injection site") How have you been since you were released from the hospital?: Better (Pt states no further bleeding to back area. Her dtr is chanigng drsg to area daily and reports no s/s of infection.) Any questions or concerns?: No (Red on EMMI-ED Discharge Alert Date & Reason:09/01/22"Lost interest in things they used to enjoy? Yes"  -Patient denies feelings-states she has just been trying not to do too much so back area can heal and not aggravate it)  Items Reviewed: Did you receive and understand the discharge instructions provided?: Yes Medications obtained,verified, and reconciled?: Yes (Medications Reviewed) Any new allergies since your discharge?: No Dietary orders reviewed?: Yes Type of Diet Ordered:: low salt/heart healthy Do you have support at home?: Yes People in Home: child(ren), adult Name of Support/Comfort Primary Source: daughter  Medications Reviewed Today: Medications Reviewed Today     Reviewed by Charlyn Minerva, RN (Registered Nurse) on 09/03/22 at 1535  Med List Status: <None>   Medication Order Taking? Sig Documenting Provider Last Dose Status Informant  albuterol (VENTOLIN HFA) 108 (90 Base) MCG/ACT inhaler [id number] Yes Inhale 2 puffs into the lungs every 6 (six) hours as needed. For wheezing Dellia Cloud, MD Taking Active   aspirin EC 81 MG tablet [id number] Yes Take 1 tablet (81 mg total) by mouth daily. Swallow whole. Adron Bene, MD Taking Active   atorvastatin (LIPITOR) 80 MG tablet [id number] Yes Take 1 tablet (80 mg total) by mouth daily.  Willette Cluster, MD Taking Active   carvedilol (COREG) 6.25 MG tablet [id number] Yes Take 1 tablet (6.25 mg total) by mouth 2 (two) times daily. Willette Cluster, MD Taking Active   cetirizine (ZYRTEC) 10 MG tablet [id number] Yes Take 1 tablet (10 mg total) by mouth daily. Masters, Florentina Addison, DO Taking Active   clopidogrel (PLAVIX) 75 MG tablet [id number] Yes Take 1 tablet (75 mg total) by mouth daily with breakfast. Marrianne Mood, MD Taking Active   collagenase [facility]) 250 UNIT/GM ointment [id number] Yes Apply 1 Application topically daily. Candelaria Stagers, DPM Taking Active   diazepam (VALIUM) 5 MG tablet [id number] Yes Take one tablet by mouth with food one hour prior to procedure. May repeat 30 minutes prior if needed. Juanda Chance, NP Taking Active   DULoxetine (CYMBALTA) 20 MG capsule [id number] Yes TAKE THREE CAPSULES (60 MG) BY MOUTH DAILY AT Coralie Carpen, MD Taking Active   fluticasone [facility]) 50 MCG/ACT nasal spray [id number] Yes One spray in each nostril daily Marrianne Mood, MD Taking Active   GERI-KOT 8.6 MG tablet [id number] Yes TAKE TWO TABLETS BY MOUTH DAILY AT Coralie Carpen, MD Taking Active   hydrochlorothiazide (HYDRODIURIL) 25 MG tablet [id number] Yes TAKE ONE TABLET (25 MG) BY MOUTH DAILY AT Coralie Carpen, MD Taking Active   methadone (DOLOPHINE) 10 MG/5ML solution [id number] Yes Take 126 mg by mouth daily.  [provider] Taking Active Self           Med Note Kimber Relic Jan 09, 2020 11:31 AM) Receive a week dose every Friday. Verified with Darl Pikes RN at [facility] 126 mg daily  Multiple Vitamins-Minerals (CENTRUM WOMEN) TABS [id number]  Yes Take 1 tablet by mouth daily. [provider] Taking Active Self  nicotine polacrilex (NICORETTE) 4 MG gum [id number] Yes Take 1 each (4 mg total) by mouth as needed for smoking cessation. Adron Bene, MD Taking Active   Sennosides [facility]) 8.6 MG CAPS [id number] Yes Take 2 tablets by mouth daily. Marrianne Mood, MD Taking Active   SYNJARDY XR 12-998 MG TB24 [id number] Yes TAKE ONE TABLET BY MOUTH DAILY AT Coralie Carpen, MD Taking Active   Med List Note Gwenith Spitz 11/19/20 1020): Patient is on a Methadone Program through [facility]             Home Care and Equipment/Supplies: Were Home Health Services Ordered?: NA Any new equipment or medical supplies ordered?: NA  Functional Questionnaire: Do you need assistance with bathing/showering or dressing?: No Do you need assistance with meal preparation?: No Do you need assistance with eating?: No Do you have difficulty maintaining continence: No Do you need assistance with getting out of bed/getting out of a chair/moving?: No Do you have difficulty managing or taking your medications?: No  Follow up appointments reviewed: PCP Follow-up appointment confirmed?: Yes Date of PCP follow-up appointment?: 09/23/22 Follow-up Provider: Dr. Joselyn Glassman Specialist [facility] Follow-up appointment confirmed?: NA Do you need transportation to your follow-up appointment?: No Do you understand care options if your condition(s) worsen?: Yes-patient verbalized understanding  SDOH Interventions Today    Flowsheet Row Most Recent Value  SDOH Interventions   Food Insecurity Interventions Intervention Not Indicated  Transportation Interventions Intervention Not Indicated       TOC Interventions Today    Flowsheet Row Most Recent Value  TOC Interventions   TOC Interventions Discussed/Reviewed TOC Interventions Discussed, Post op wound/incision care      Interventions Today    Flowsheet Row Most Recent Value  General Interventions   General Interventions Discussed/Reviewed General Interventions Discussed, Doctor Visits  Doctor Visits Discussed/Reviewed Doctor Visits Discussed, PCP  PCP/Specialist Visits Compliance with follow-up visit  Education Interventions   Education Provided Provided Education  Provided Verbal Education On Nutrition, When  to see the doctor, Medication  Nutrition Interventions   Nutrition Discussed/Reviewed Nutrition Discussed  Pharmacy Interventions   Pharmacy Dicussed/Reviewed Pharmacy Topics Discussed, Medications and their functions  Safety Interventions   Safety Discussed/Reviewed Safety Discussed        Alessandra Grout [facility] Care Management Care Management Community Coordinator Direct Phone: [phone] Toll Free: [phone] Fax: [phone]

## 2022-09-04 ENCOUNTER — Ambulatory Visit
Admission: RE | Admit: 2022-09-04 | Discharge: 2022-09-04 | Disposition: A | Discharge status: Home / Self Care | Payer: 59 | Source: Ambulatory Visit | Attending: Family Medicine | Admitting: Family Medicine

## 2022-09-04 DIAGNOSIS — Z1231 Encounter for screening mammogram for malignant neoplasm of breast: Secondary | ICD-10-CM

## 2022-09-09 ENCOUNTER — Other Ambulatory Visit: Payer: Self-pay

## 2022-09-09 DIAGNOSIS — R928 Other abnormal and inconclusive findings on diagnostic imaging of breast: Secondary | ICD-10-CM

## 2022-09-10 ENCOUNTER — Ambulatory Visit
Admission: RE | Admit: 2022-09-10 | Discharge: 2022-09-10 | Disposition: A | Discharge status: Home / Self Care | Payer: 59 | Source: Ambulatory Visit | Attending: Internal Medicine | Admitting: Internal Medicine

## 2022-09-10 DIAGNOSIS — R928 Other abnormal and inconclusive findings on diagnostic imaging of breast: Secondary | ICD-10-CM

## 2022-09-10 DIAGNOSIS — R921 Mammographic calcification found on diagnostic imaging of breast: Secondary | ICD-10-CM | POA: Diagnosis not present

## 2022-09-11 ENCOUNTER — Other Ambulatory Visit: Payer: Self-pay | Admitting: Internal Medicine

## 2022-09-11 DIAGNOSIS — R921 Mammographic calcification found on diagnostic imaging of breast: Secondary | ICD-10-CM

## 2022-09-22 ENCOUNTER — Encounter: Payer: Self-pay | Admitting: Pharmacist

## 2022-09-22 ENCOUNTER — Other Ambulatory Visit: Payer: Self-pay | Admitting: Student

## 2022-09-22 DIAGNOSIS — Z79899 Other long term (current) drug therapy: Secondary | ICD-10-CM

## 2022-09-22 NOTE — Progress Notes (Signed)
[facility])                                            [facility] Quality Pharmacy Team                                        Statin Quality Measure Assessment    09/22/2022  Rose [name] 11-30-1955 [id number]  I am a [facility] clinical pharmacist that reviews patients for statin quality initiatives.     Per review of chart and payor information, patient has a diagnosis of diabetes and cardiovascular disease but is not currently filling a statin prescription.  This places patient into the SUPD (Statin Use In Patients with Diabetes) and SPC (Statin Use in Patients with Cardiovascular Disease) measures for CMS.    Atorvastatin is on the patient's medication list but it has not been filled this year per pharmacy fill history data.  Patient has an upcoming appointment on 09/23/22.    If deemed therapeutically appropriate, a statin exclusion code could be associated with the upcoming visit. If the patient has experienced statin intolerance, a statin exclusion code could be associated with the upcoming visit.  Associating a code will remove the patient from the measure.      Component Value Date/Time   CHOL 98 (L) 12/10/2021 1115   TRIG 160 (H) 12/10/2021 1115   HDL 29 (L) 12/10/2021 1115   CHOLHDL 3.4 12/10/2021 1115   LDLCALC 42 12/10/2021 1115    Please consider ONE of the following recommendations:  Initiate high intensity statin Atorvastatin 40mg  once daily, #90, 3 refills   Rosuvastatin 20mg  once daily, #90, 3 refills    Initiate moderate intensity  statin with reduced frequency if prior  statin intolerance 1x weekly, #13, 3 refills   2x weekly, #26, 3 refills   3x weekly, #39, 3 refills    Code for past statin intolerance  (required annually)   Provider Requirements:  Must associate code during an office visit or telehealth encounter   Drug Induced Myopathy G72.0   Myositis, unspecified M60.9   Myopathy, unspecified  G72.9   Rhabdomyolysis  M62.82               Plan: Route note to Provider prior to the upcoming appointment.  Beecher Mcardle, PharmD, BCACP [phone]

## 2022-09-23 ENCOUNTER — Other Ambulatory Visit: Payer: Self-pay

## 2022-09-23 ENCOUNTER — Ambulatory Visit (INDEPENDENT_AMBULATORY_CARE_PROVIDER_SITE_OTHER): Payer: 59

## 2022-09-23 VITALS — BP 116/64 | HR 74 | Temp 98.0°F | Ht 63.0 in | Wt 132.0 lb

## 2022-09-23 VITALS — BP 97/57 | HR 71 | Temp 98.0°F | Ht 63.0 in | Wt 132.0 lb

## 2022-09-23 DIAGNOSIS — Z7984 Long term (current) use of oral hypoglycemic drugs: Secondary | ICD-10-CM | POA: Diagnosis not present

## 2022-09-23 DIAGNOSIS — Z23 Encounter for immunization: Secondary | ICD-10-CM | POA: Diagnosis not present

## 2022-09-23 DIAGNOSIS — E119 Type 2 diabetes mellitus without complications: Secondary | ICD-10-CM | POA: Diagnosis not present

## 2022-09-23 DIAGNOSIS — I10 Essential (primary) hypertension: Secondary | ICD-10-CM

## 2022-09-23 DIAGNOSIS — Z0000 Encounter for general adult medical examination without abnormal findings: Secondary | ICD-10-CM | POA: Diagnosis not present

## 2022-09-23 DIAGNOSIS — F17210 Nicotine dependence, cigarettes, uncomplicated: Secondary | ICD-10-CM

## 2022-09-23 DIAGNOSIS — F17200 Nicotine dependence, unspecified, uncomplicated: Secondary | ICD-10-CM

## 2022-09-23 DIAGNOSIS — R634 Abnormal weight loss: Secondary | ICD-10-CM | POA: Insufficient documentation

## 2022-09-23 DIAGNOSIS — E1169 Type 2 diabetes mellitus with other specified complication: Secondary | ICD-10-CM

## 2022-09-23 LAB — POCT GLYCOSYLATED HEMOGLOBIN (HGB A1C): Hemoglobin A1C: 6.6 % — AB (ref 4.0–5.6)

## 2022-09-23 LAB — GLUCOSE, CAPILLARY: Glucose-Capillary: 144 mg/dL — ABNORMAL HIGH (ref 70–99)

## 2022-09-23 MED ORDER — NICOTINE 7 MG/24HR TD PT24: 7.0000 mg | MEDICATED_PATCH | TRANSDERMAL | 0 refills | Status: AC

## 2022-09-23 MED ORDER — NICOTINE 14 MG/24HR TD PT24: 14.0000 mg | MEDICATED_PATCH | TRANSDERMAL | 0 refills | Status: AC

## 2022-09-23 NOTE — Assessment & Plan Note (Signed)
Patient reports using 12-15 cigarettes daily from age [age]. Currently smoking 2-3 per day. Previously offered nicotine replacement, says she never got it. Still interested in this. -nicotine patch 14mg /day x 6 weeks, then 7mg / day x 2 weeks

## 2022-09-23 NOTE — Progress Notes (Signed)
Subjective:   Rose [name] is a 67 y.o. female who presents for Medicare Annual (Subsequent) preventive examination.  Visit Complete: In person  Patient Medicare AWV questionnaire was completed by the patient on 09/22/2022; I have confirmed that all information answered by patient is correct and no changes since this date.  Review of Systems    Defer to PCP       Objective:    Today's Vitals   09/23/22 1337  BP: (!) 97/57  Pulse: 71  Temp: 98 F (36.7 C)  TempSrc: Oral  SpO2: 100%  Weight: 132 lb (59.9 kg)  Height: 5\' 3"  (1.6 m)  PainSc: 9    Body mass index is 23.38 kg/m.     09/23/2022    1:38 PM 09/23/2022    9:58 AM 08/27/2022   10:31 PM 06/23/2022    1:48 PM 06/04/2022    7:25 AM 04/23/2022   10:29 AM 03/26/2022    9:01 AM  Advanced Directives  Does Patient Have a Medical Advance Directive? No No No No No No No  Would patient like information on creating a medical advance directive? No - Patient declined No - Patient declined  No - Patient declined Yes (MAU/Ambulatory/Procedural Areas - Information given) Yes (MAU/Ambulatory/Procedural Areas - Information given) No - Patient declined    Current Medications (verified) Outpatient Encounter Medications as of 09/23/2022  Medication Sig   albuterol (VENTOLIN HFA) 108 (90 Base) MCG/ACT inhaler Inhale 2 puffs into the lungs every 6 (six) hours as needed. For wheezing   aspirin EC 81 MG tablet Take 1 tablet (81 mg total) by mouth daily. Swallow whole.   atorvastatin (LIPITOR) 80 MG tablet Take 1 tablet (80 mg total) by mouth daily.   carvedilol (COREG) 6.25 MG tablet Take 1 tablet (6.25 mg total) by mouth 2 (two) times daily.   cetirizine (ZYRTEC) 10 MG tablet Take 1 tablet (10 mg total) by mouth daily.   clopidogrel (PLAVIX) 75 MG tablet Take 1 tablet (75 mg total) by mouth daily with breakfast.   collagenase (SANTYL) 250 UNIT/GM ointment Apply 1 Application topically daily.   diazepam (VALIUM) 5 MG tablet  Take one tablet by mouth with food one hour prior to procedure. May repeat 30 minutes prior if needed.   DULoxetine (CYMBALTA) 20 MG capsule TAKE THREE CAPSULES (60 MG) BY MOUTH DAILY AT 9AM   fluticasone (FLONASE) 50 MCG/ACT nasal spray One spray in each nostril daily   GERI-KOT 8.6 MG tablet TAKE TWO TABLETS BY MOUTH DAILY AT 9AM   methadone (DOLOPHINE) 10 MG/5ML solution Take 126 mg by mouth daily.    Multiple Vitamins-Minerals (CENTRUM WOMEN) TABS Take 1 tablet by mouth daily.   nicotine (NICODERM CQ - DOSED IN MG/24 HOURS) 14 mg/24hr patch Place 1 patch (14 mg total) onto the skin daily.   [START ON 11/05/2022] nicotine (NICODERM CQ - DOSED IN MG/24 HR) 7 mg/24hr patch Place 1 patch (7 mg total) onto the skin daily for 14 days.   nicotine polacrilex (NICORETTE) 4 MG gum Take 1 each (4 mg total) by mouth as needed for smoking cessation.   Sennosides (SENNA) 8.6 MG CAPS Take 2 tablets by mouth daily.   SYNJARDY XR 12-998 MG TB24 TAKE ONE TABLET BY MOUTH DAILY AT 9AM   No facility-administered encounter medications on file as of 09/23/2022.    Allergies (verified) Lisinopril, Gabapentin, Vicodin [hydrocodone-acetaminophen], and Hydrocodone-acetaminophen   History: Past Medical History:  Diagnosis Date   ACE inhibitor-aggravated angioedema, initial  encounter 04/26/2016   Allergic rhinitis    Anxiety    Arthritis    Calculus of gallbladder without cholecystitis without obstruction 10/28/2018   Chronic back pain    CKD (chronic kidney disease), stage III (HCC)    folllowed by pcp   Coronary artery disease    Depression    Diabetic neuropathy (HCC)    Edema of lower extremity 10/28/2018   Encounter for completion of form with patient 06/21/2019   Environmental allergies    Environmental allergies 12/28/2015   Fibromyalgia    Foot ulcer secondary to diabetes 06/21/2019   Frequency of urination    History of chronic bronchitis    History of drug abuse (HCC) states quit herion in 2006    History of hepatitis C last abd ultrasound in epic 11-10-2018;  last hepative panel in epic 11-30-2018 was normal   previously followed by [facility] (notes in care everywhere)-- dx 04/ 2015,  started harvonic treatment 07/ 2015   History of rib fracture 10/28/2018   HPV in female 06/11/2016   Hypertension    followed by pcp  (nuclear stress test 01-15-2017 in epic,  showed low risk normal w/ nuclear ef 55%)   Laceration of right foot 03/04/2019   Lipoma of neck 07/16/2017   Seen by general surgery Dr Carolynne Edouard May 16 - plan for removal of 2cm sebaceous cyst.    Methadone maintenance therapy patient (HCC)    Mild asthma    followed by pcp   Nocturia    PAD (peripheral artery disease) (HCC)    followed by cardiology, dr Kirke Corin---  06-01-2019  s/p bilateral CIA angioplasty stenting   Recurrent falls 04/30/2016   Right foot ulcer (HCC)    Symptomatic anemia 11/15/2020   Tendonitis    bilateral wrist   Type 2 diabetes mellitus (HCC)    followed by pcp   (06-16-2019  per pt checks blood sugar every other day in am,  fasting sugar 92-95)   Urge urinary incontinence    Past Surgical History:  Procedure Laterality Date   ABDOMINAL AORTOGRAM W/LOWER EXTREMITY Bilateral 06/01/2019   Procedure: ABDOMINAL AORTOGRAM W/LOWER EXTREMITY;  Surgeon: [name], MD;  Location: [facility];  Service: Cardiovascular;  Laterality: Bilateral;   ABDOMINAL AORTOGRAM W/LOWER EXTREMITY N/A 06/04/2022   Procedure: ABDOMINAL AORTOGRAM W/LOWER EXTREMITY;  Surgeon: [name], MD;  Location: [facility];  Service: Cardiovascular;  Laterality: N/A;   APPENDECTOMY  1972   INTERSTIM IMPLANT PLACEMENT  10/23/2011   Procedure: Leane Platt IMPLANT FIRST STAGE;  Surgeon: Martina Sinner, MD;  Location: [facility];  Service: Urology;  Laterality: N/A;   INTERSTIM IMPLANT PLACEMENT  10/23/2011   Procedure: Leane Platt IMPLANT SECOND STAGE;  Surgeon: Martina Sinner, MD;   Location: [facility];  Service: Urology;  Laterality: N/A;   LEFT HEART CATH AND CORONARY ANGIOGRAPHY N/A 01/11/2020   Procedure: LEFT HEART CATH AND CORONARY ANGIOGRAPHY;  Surgeon: [name], MD;  Location: [facility];  Service: Cardiovascular;  Laterality: N/A;   PANACOS TISSUE GRAFT Right 06/24/2019   Procedure: Application of Skin Graft Substitute;  Surgeon: Park Liter, DPM;  Location: [facility];  Service: Podiatry;  Laterality: Right;   PERIPHERAL VASCULAR INTERVENTION Bilateral 06/01/2019   Procedure: PERIPHERAL VASCULAR INTERVENTION;  Surgeon: [name], MD;  Location: [facility];  Service: Cardiovascular;  Laterality: Bilateral;   WOUND DEBRIDEMENT Right 06/24/2019  Procedure: DEBRIDEMENT WOUND;  Surgeon: Park Liter, DPM;  Location: [facility];  Service: Podiatry;  Laterality: Right;   WOUND DEBRIDEMENT Right 08/24/2019   Procedure: DEBRIDEMENT RIGHT FOOT WOUND; APPLICATION OF SKIN GRAFT SUBSTITUTE;  Surgeon: Park Liter, DPM;  Location: [facility];  Service: Podiatry;  Laterality: Right;   Family History  Problem Relation Age of Onset   Hypertension Mother    Diabetes type II Mother    Heart attack Mother 34   CAD Father    Heart attack Father 25   Hypertension Brother    Diabetes type II Brother    Heart attack Brother 59   Diabetes type II Sister    Social History   Socioeconomic History   Marital status: Single    Spouse name: Not on file   Number of children: Not on file   Years of education: Not on file   Highest education level: Not on file  Occupational History   Not on file  Tobacco Use   Smoking status: Some Days    Packs/day: 0.50    Years: 20.00    Additional pack years: 0.00    Total pack years: 10.00    Types: Cigarettes   Smokeless tobacco: Never   Tobacco comments:    SMOKES 4 CIGS PER DAY  Vaping Use   Vaping Use: Never used  Substance  and Sexual Activity   Alcohol use: No    Alcohol/week: 0.0 standard drinks of alcohol   Drug use: Not Currently    Comment: hx heroin use, IV,  last used 2006 (currently on methadone)   Sexual activity: Not Currently  Other Topics Concern   Not on file  Social History Narrative   Not on file   Social Determinants of Health   Financial Resource Strain: Low Risk  (09/23/2022)   Overall Financial Resource Strain (CARDIA)    Difficulty of Paying Living Expenses: Not hard at all  Food Insecurity: No Food Insecurity (09/23/2022)   Hunger Vital Sign    Worried About Running Out of Food in the Last Year: Never true    Ran Out of Food in the Last Year: Never true  Transportation Needs: No Transportation Needs (09/23/2022)   PRAPARE - Administrator, Civil Service (Medical): No    Lack of Transportation (Non-Medical): No  Physical Activity: Insufficiently Active (09/23/2022)   Exercise Vital Sign    Days of Exercise per Week: 2 days    Minutes of Exercise per Session: 20 min  Stress: No Stress Concern Present (09/23/2022)   [employer] of Occupational Health - Occupational Stress Questionnaire    Feeling of Stress : Not at all  Social Connections: Moderately Isolated (09/23/2022)   Social Connection and Isolation Panel [NHANES]    Frequency of Communication with Friends and Family: Three times a week    Frequency of Social Gatherings with Friends and Family: Twice a week    Attends Religious Services: More than 4 times per year    Active Member of [employer] or Organizations: No    Attends Engineer, structural: Never    Marital Status: Never married    Tobacco Counseling Ready to quit: Not Answered Counseling given: Not Answered Tobacco comments: SMOKES 4 CIGS PER DAY   Clinical Intake:  Pre-visit preparation completed: Yes  Pain : 0-10 Pain Score: 9      Nutritional Risks: None Diabetes: No  How often do you need to have someone  help you when you read  instructions, pamphlets, or other written materials from your doctor or pharmacy?: 1 - Never What is the last grade level you completed in school?: GED/ 1 year college  Interpreter Needed?: No  Information entered by :: Lecia Esperanza,cma   Activities of Daily Living    09/23/2022    1:38 PM 09/23/2022    9:58 AM  In your present state of health, do you have any difficulty performing the following activities:  Hearing? 0 0  Vision? 0 0  Difficulty concentrating or making decisions? 1 1  Comment  AT TIMES  Walking or climbing stairs? 0 0  Dressing or bathing? 0 0  Doing errands, shopping? 0 0    Patient Care Team: Willette Cluster, MD as PCP - General [name], MD as PCP - Cardiology (Cardiology)  Indicate any recent Medical Services you may have received from other than [facility] providers in the past year (date may be approximate).     Assessment:   This is a routine wellness examination for Bradlee.  Hearing/Vision screen No results found.  Dietary issues and exercise activities discussed:     Goals Addressed   None   Depression Screen    09/23/2022    1:38 PM 06/23/2022    1:48 PM 04/23/2022   10:30 AM 03/26/2022    9:01 AM 12/10/2021   10:34 AM 10/14/2021    9:51 AM 07/15/2021    1:12 PM  PHQ 2/9 Scores  PHQ - 2 Score 0 0 0 0  0 1  PHQ- 9 Score   4    4  Exception Documentation     Patient refusal      Fall Risk    09/23/2022    1:38 PM 09/23/2022    9:58 AM 06/23/2022    1:47 PM 04/23/2022   10:32 AM 04/23/2022   10:30 AM  Fall Risk   Falls in the past year? 1 1 1  1   Number falls in past yr: 1 1 1  1   Injury with Fall? 0 0 1  1  Risk for fall due to : Impaired balance/gait Impaired balance/gait;No Fall Risks Impaired balance/gait;Impaired mobility  Impaired balance/gait  Follow up Falls evaluation completed;Falls prevention discussed Falls evaluation completed Falls evaluation completed;Falls prevention discussed Falls evaluation completed Falls  evaluation completed    MEDICARE RISK AT HOME:   TIMED UP AND GO:  Was the test performed?  No    Cognitive Function:        09/23/2022    1:38 PM 07/15/2021    1:21 PM  6CIT Screen  What Year? 0 points 0 points  What month? 0 points 0 points  What time? 0 points 0 points  Count back from 20 0 points 0 points  Months in reverse 0 points 0 points  Repeat phrase  0 points  Total Score  0 points    Immunizations Immunization History  Administered Date(s) Administered   DTaP 09/07/2015   Hepatitis B 10/06/2013   Influenza Split 02/28/2012   Influenza,inj,Quad PF,6+ Mos 12/12/2015, 01/23/2017, 01/23/2017, 12/22/2017, 01/04/2019, 01/16/2020, 12/10/2021   PFIZER(Purple Top)SARS-COV-2 Vaccination 04/01/2019, 05/10/2019   PNEUMOCOCCAL CONJUGATE-20 09/23/2022   Pneumococcal Conjugate-13 07/29/2013, 09/07/2015   Pneumococcal Polysaccharide-23 02/28/2012   Tdap 04/19/2015, 11/15/2020   Zoster Recombinat (Shingrix) 03/01/2021    TDAP status: Up to date  Flu Vaccine status: Up to date  Pneumococcal vaccine status: Up to date  Covid-19 vaccine status: Completed vaccines  Qualifies for  Shingles Vaccine? No   Zostavax completed No   Shingrix Completed?: Yes  Screening Tests Health Maintenance  Topic Date Due   PAP SMEAR-Modifier  05/13/2019   COVID-19 Vaccine (3 - Pfizer risk series) 06/07/2019   Zoster Vaccines- Shingrix (2 of 2) 04/26/2021   Diabetic kidney evaluation - Urine ACR  10/15/2022   INFLUENZA VACCINE  10/30/2022   FOOT EXAM  12/11/2022   HEMOGLOBIN A1C  03/25/2023   OPHTHALMOLOGY EXAM  05/01/2023   Diabetic kidney evaluation - eGFR measurement  05/21/2023   Medicare Annual Wellness (AWV)  09/23/2023   MAMMOGRAM  09/09/2024   DTaP/Tdap/Td (4 - Td or Tdap) 11/16/2030   Pneumonia Vaccine 29+ Years old  Completed   DEXA SCAN  Completed   Hepatitis C Screening  Completed   HIV Screening  Completed   HPV VACCINES  Aged Out    Health  Maintenance  Health Maintenance Due  Topic Date Due   PAP SMEAR-Modifier  05/13/2019   COVID-19 Vaccine (3 - Pfizer risk series) 06/07/2019   Zoster Vaccines- Shingrix (2 of 2) 04/26/2021   Diabetic kidney evaluation - Urine ACR  10/15/2022      Mammogram status: Completed 09/10/2022. Repeat every year Cancer Screening: (Low Dose CT Chest recommended if Age 23-80 years, 20 pack-year currently smoking OR have quit w/in 15years.) does not qualify.   Lung Cancer Screening Referral: N/A  Additional Screening:  Hepatitis C Screening: does not qualify; Completed 11/15/2020  Vision Screening: Recommended annual ophthalmology exams for early detection of glaucoma and other disorders of the eye. Is the patient up to date with their annual eye exam?  Yes  Who is the provider or what is the name of the office in which the patient attends annual eye exams? Dr.Groat If pt is not established with a provider, would they like to be referred to a provider to establish care? No .   Dental Screening: Recommended annual dental exams for proper oral hygiene  Diabetic Foot Exam: Diabetic Foot Exam: Completed 12/10/2021  Community Resource Referral / Chronic Care Management: CRR required this visit?  No   CCM required this visit?  No     Plan:     I have personally reviewed and noted the following in the patient's chart:   Medical and social history Use of alcohol, tobacco or illicit drugs  Current medications and supplements including opioid prescriptions. Patient is not currently taking opioid prescriptions. Functional ability and status Nutritional status Physical activity Advanced directives List of other physicians Hospitalizations, surgeries, and ER visits in previous 12 months Vitals Screenings to include cognitive, depression, and falls Referrals and appointments  In addition, I have reviewed and discussed with patient certain preventive protocols, quality metrics, and best  practice recommendations. A written personalized care plan for preventive services as well as general preventive health recommendations were provided to patient.     Cala Bradford, CMA   09/23/2022   After Visit Summary: (Mail) Due to this being a telephonic visit, the after visit summary with patients personalized plan was offered to patient via mail   Nurse Notes: Face-To-Face Visit   Ms. Karilyn Cota , Thank you for taking time to come for your Medicare Wellness Visit. I appreciate your ongoing commitment to your health goals. Please review the following plan we discussed and let me know if I can assist you in the future.   These are the goals we discussed:  Goals   None     This is a list  of the screening recommended for you and due dates:  Health Maintenance  Topic Date Due   Pap Smear  05/13/2019   COVID-19 Vaccine (3 - Pfizer risk series) 06/07/2019   Zoster (Shingles) Vaccine (2 of 2) 04/26/2021   Yearly kidney health urinalysis for diabetes  10/15/2022   Flu Shot  10/30/2022   Complete foot exam   12/11/2022   Hemoglobin A1C  03/25/2023   Eye exam for diabetics  05/01/2023   Yearly kidney function blood test for diabetes  05/21/2023   Medicare Annual Wellness Visit  09/23/2023   Mammogram  09/09/2024   DTaP/Tdap/Td vaccine (4 - Td or Tdap) 11/16/2030   Pneumonia Vaccine  Completed   DEXA scan (bone density measurement)  Completed   Hepatitis C Screening  Completed   HIV Screening  Completed   HPV Vaccine  Aged Out

## 2022-09-23 NOTE — Addendum Note (Signed)
Addended by: Willette Cluster on: 09/23/2022 01:50 PM   Modules accepted: Level of Service

## 2022-09-23 NOTE — Assessment & Plan Note (Signed)
PCV 20 administered today 

## 2022-09-23 NOTE — Patient Instructions (Signed)
Thank you, Rose [name] for allowing [place] to provide your care today. Today we discussed :  Weight loss: We need to rule out malignancy. You are up to date on your pap smear and will have this done in a year. You have  a long standing smoking history, we will get a CT of your chest to evaluate for lung cancer. You should get the breast biopsy as scheduled based on recent mammogram findings. You are up to date on colon cancer screening.  Tobacco use: I have prescribed nicotine patches. You will use one 14 mg patch daily for 6 weeks, then one 7mg  patch for 2 weeks.  Diabetes: Your A1c is well controlled at 6.6%. We will see you in 3 months.  Low blood pressure: Stop taking hydrochlorothiazide.  I have ordered the following labs for you:   Lab Orders         Glucose, capillary         POC Hbg A1C        I have ordered the following medication/changed the following medications:   Stop the following medications: Medications Discontinued During This Encounter  Medication Reason   hydrochlorothiazide (HYDRODIURIL) 25 MG tablet      Start the following medications: Meds ordered this encounter  Medications   nicotine (NICODERM CQ - DOSED IN MG/24 HOURS) 14 mg/24hr patch    Sig: Place 1 patch (14 mg total) onto the skin daily.    Dispense:  42 patch    Refill:  0   nicotine (NICODERM CQ - DOSED IN MG/24 HR) 7 mg/24hr patch    Sig: Place 1 patch (7 mg total) onto the skin daily for 14 days.    Dispense:  14 patch    Refill:  0     Follow up: 3 months     We look forward to seeing you next time. Please call our clinic at [phone] if you have any questions or concerns. The best time to call is Monday-Friday from 9am-4pm, but there is someone available 24/7. If after hours or the weekend, call the main hospital number and ask for the Internal Medicine Resident On-Call. If you need medication refills, please notify your pharmacy one week in advance and they will send [place] a  request.   Thank you for trusting me with your care. Wishing you the best!   Willette Cluster, MD [facility]

## 2022-09-23 NOTE — Progress Notes (Deleted)
CAD s/p stent to LAS 2021 PAD s/p bilateral common iliac stent - continue plavix - start daily aspirin 81 - nicotine patches and nicorette gum, chantix in the past -LE angio by cards 05/2022 and aptent  HTN --Continue HCTZ 25 mg daily: switch to lisinopril?? Mod microalbum 30 -Continue current 6.25 mg twice a day  T2DM Patient has been taking empagliflozin metformin 12-998  A1c 6.3 to** LDL 42 9 months ago  Chronic back pain- lumbar meolograpy 2020 evidence of mild grade 1 anterolisthesis of L4 on L5 when upright, and moderate to severe facet arthropathy at that level. So perhaps L4-L5 is the symptomatic level. And there is underlying chronic L5-S1 ankylosis. Mild lumbar disc bulging. Up to mild bilateral L2 through L4 neural foraminal stenosis. Was using cain, dme walker ordered. Steroid injection 2022. PT referal multiple times. -flexeryl  5 mg -Cymbalta 60 mg -methadone 126 daily; miralax, docusate, senna  -seen by ortho; repeat L3-L4, L4-L5 and L5-S1 radiofrequency ablation; pmr referral   CKD3 BMP 05/2022 cr 1.51 Repeat 6-12 months  HCM Pcv 20 Pap performed 5 years ago

## 2022-09-23 NOTE — Progress Notes (Signed)
Established Patient Office Visit  Subjective   Patient ID: Rose [name], female    DOB: 02-18-1956  Age: 67 y.o. MRN: [id number]  Chief Complaint  Patient presents with   Follow-up    CHRONIC LOWER BACK PAIN # 9     Rose [name] is a 67 y/o female with a pmh outlined below. Please see A&P for HPI information.      Review of Systems  All other systems reviewed and are negative.     Objective:     BP 116/64 (BP Location: Left Arm, Patient Position: Sitting, Cuff Size: Normal)   Pulse 74   Temp 98 F (36.7 C) (Oral)   Ht 5\' 3"  (1.6 m)   Wt 132 lb (59.9 kg)   SpO2 100%   BMI 23.38 kg/m    Physical Exam Constitutional:      General: She is not in acute distress.    Appearance: Normal appearance. She is normal weight. She is not ill-appearing.  Cardiovascular:     Rate and Rhythm: Normal rate and regular rhythm.     Pulses: Normal pulses.     Heart sounds: Normal heart sounds. No murmur heard.    No gallop.  Pulmonary:     Effort: Pulmonary effort is normal. No respiratory distress.     Breath sounds: Normal breath sounds. No wheezing or rales.  Musculoskeletal:     Right lower leg: No edema.     Left lower leg: No edema.  Skin:    General: Skin is warm and dry.  Neurological:     Mental Status: She is alert.      Results for orders placed or performed in visit on 09/23/22  Glucose, capillary  Result Value Ref Range   Glucose-Capillary 144 (H) 70 - 99 mg/dL  POC Hbg W0J  Result Value Ref Range   Hemoglobin A1C 6.6 (A) 4.0 - 5.6 %   HbA1c POC (<> result, manual entry)     HbA1c, POC (prediabetic range)     HbA1c, POC (controlled diabetic range)        The ASCVD Risk score (Arnett DK, et al., 2019) failed to calculate for the following reasons:   The valid total cholesterol range is 130 to 320 mg/dL    Assessment & Plan:   Problem List Items Addressed This Visit       Cardiovascular and Mediastinum   Essential hypertension     Patient found to have BP of 97/57 today in clinic. She was lethargic, otherwise denying lightheadedness/dizziness, vision changes, CP,SOB. Repeat BP after drinking water 116/64. Says her BP at home when she checks is usually 90-110 systolic. Will stop hydrochlorothiazide today. If need BP meds in future can restart hydrochlorothiazide at lower dose of 12.5 or could consider losartan given her CKD 3. Of note has angioedema with prior ACE inhibitor use. -discontinue hydrochlorothiazide 25mg  -monitor BP        Endocrine   Type 2 diabetes mellitus (HCC) - Primary    A1c from 6.3 to 6.6%. Up to date on foot, eye, microalbumin exams. LDL 42 nine months ago at goal of <70 for secondary prevention. Will continue empagliflozin metformin 12-998 with 3 month A1c follow up.      Relevant Orders   POC Hbg A1C (Completed)     Other   Healthcare maintenance    PCV 20 administered today.      Tobacco use disorder    Patient reports using 12-15 cigarettes daily from  age [age]. Currently smoking 2-3 per day. Previously offered nicotine replacement, says she never got it. Still interested in this. -nicotine patch 14mg /day x 6 weeks, then 7mg / day x 2 weeks      Relevant Medications   nicotine (NICODERM CQ - DOSED IN MG/24 HOURS) 14 mg/24hr patch   nicotine (NICODERM CQ - DOSED IN MG/24 HR) 7 mg/24hr patch (Start on 11/05/2022)   Other Relevant Orders   CT Chest Wo Contrast   Unintentional weight loss    Patient reports unintentional weight loss over past few months, per chart review 10lbs since February. Denies fevers, night sweats. Denies melena or hematochezia, vaginal bleeding. Last pap smear with co-testing normal 5 years ago, due for repeat in 1 year. Cologuard normal within the last year. Has had abnormal mammogram findings, scheduled for breast biopsy. Has >20 year smoking history with current use, will get CT chest for lung cancer evaluation.      Relevant Orders   CT Chest Wo Contrast    Other Visit Diagnoses     Need for prophylactic vaccination with Streptococcus pneumoniae (Pneumococcus) and Influenza vaccines       Relevant Orders   Pneumococcal conjugate vaccine 20-valent (Prevnar 20) (Completed)       Return in about 3 months (around 12/24/2022) for diabetes.    Willette Cluster, MD

## 2022-09-23 NOTE — Assessment & Plan Note (Signed)
A1c from 6.3 to 6.6%. Up to date on foot, eye, microalbumin exams. LDL 42 nine months ago at goal of <70 for secondary prevention. Will continue empagliflozin metformin 12-998 with 3 month A1c follow up.

## 2022-09-23 NOTE — Assessment & Plan Note (Addendum)
Patient found to have BP of 97/57 today in clinic. She was lethargic, otherwise denying lightheadedness/dizziness, vision changes, CP,SOB. Repeat BP after drinking water 116/64. Says her BP at home when she checks is usually 90-110 systolic. Will stop hydrochlorothiazide today. If need BP meds in future can restart hydrochlorothiazide at lower dose of 12.5 or could consider losartan given her CKD 3. Of note has angioedema with prior ACE inhibitor use. -discontinue hydrochlorothiazide 25mg  -monitor BP

## 2022-09-23 NOTE — Assessment & Plan Note (Signed)
Patient reports unintentional weight loss over past few months, per chart review 10lbs since February. Denies fevers, night sweats. Denies melena or hematochezia, vaginal bleeding. Last pap smear with co-testing normal 5 years ago, due for repeat in 1 year. Cologuard normal within the last year. Has had abnormal mammogram findings, scheduled for breast biopsy. Has >20 year smoking history with current use, will get CT chest for lung cancer evaluation.

## 2022-09-24 ENCOUNTER — Ambulatory Visit
Admission: RE | Admit: 2022-09-24 | Discharge: 2022-09-24 | Disposition: A | Discharge status: Home / Self Care | Payer: 59 | Source: Ambulatory Visit | Attending: Internal Medicine | Admitting: Internal Medicine

## 2022-09-24 ENCOUNTER — Ambulatory Visit (HOSPITAL_BASED_OUTPATIENT_CLINIC_OR_DEPARTMENT_OTHER): Payer: 59 | Attending: Student in an Organized Health Care Education/Training Program

## 2022-09-24 DIAGNOSIS — N6489 Other specified disorders of breast: Secondary | ICD-10-CM | POA: Diagnosis not present

## 2022-09-24 DIAGNOSIS — R921 Mammographic calcification found on diagnostic imaging of breast: Secondary | ICD-10-CM

## 2022-09-24 HISTORY — PX: BREAST BIOPSY: SHX20

## 2022-09-24 NOTE — Progress Notes (Signed)
Internal Medicine Clinic Attending  Case discussed with Dr. Rogers  At the time of the visit.  We reviewed the resident's history and exam and pertinent patient test results.  I agree with the assessment, diagnosis, and plan of care documented in the resident's note.  

## 2022-09-30 ENCOUNTER — Ambulatory Visit (INDEPENDENT_AMBULATORY_CARE_PROVIDER_SITE_OTHER): Payer: 59 | Admitting: Podiatry

## 2022-09-30 ENCOUNTER — Encounter: Payer: Self-pay | Admitting: Podiatry

## 2022-09-30 DIAGNOSIS — B351 Tinea unguium: Secondary | ICD-10-CM | POA: Diagnosis not present

## 2022-09-30 DIAGNOSIS — E1151 Type 2 diabetes mellitus with diabetic peripheral angiopathy without gangrene: Secondary | ICD-10-CM

## 2022-09-30 DIAGNOSIS — M79609 Pain in unspecified limb: Secondary | ICD-10-CM | POA: Diagnosis not present

## 2022-10-06 NOTE — Progress Notes (Signed)
Internal Medicine Clinic Attending  Case and documentation of CMA Everardo All reviewed.  I reviewed the AWV findings.  I agree with the plan of care documented in the AWV note.

## 2022-10-06 NOTE — Progress Notes (Signed)
  Subjective:  Patient ID: Rose [name], female    DOB: 02-21-1956,  MRN: [id number]  Rose [name] presents to clinic today for at risk foot care. Pt has h/o NIDDM with PAD and painful thick toenails that are difficult to trim. Pain interferes with ambulation. Aggravating factors include wearing enclosed shoe gear. Pain is relieved with periodic professional debridement.  Chief Complaint  Patient presents with   Diabetes    DFC BS - 97  A1C - 6.1 LVPCP - 2WKS AGO   Callouses    CALLUS BILAT ABN SIGNED   New problem(s): None.   PCP is [name], MD.  Allergies  Allergen Reactions   Lisinopril Swelling    Angioedema   Gabapentin Hives and Other (See Comments)    Only when takes over 300 mg dose. "gives me the shakes"   Vicodin [Hydrocodone-Acetaminophen] Hives, Diarrhea and Other (See Comments)    Stomach  cramps   Hydrocodone-Acetaminophen     Review of Systems: Negative except as noted in the HPI.  Objective:  There were no vitals filed for this visit.  Rose [name] is a pleasant 67 y.o. female WD, WN in NAD. AAO x 3.  Vascular Examination: CFT <4 seconds b/l. DP pulses diminished b/l. PT pulses diminished b/l. Digital hair absent. Skin temperature gradient warm to cool b/l. No ischemia or gangrene. No cyanosis or clubbing noted b/l. No ischemia or gangrene noted b/l LE.   Neurological Examination: Sensation grossly intact b/l with 10 gram monofilament. Vibratory sensation intact b/l.   Dermatological Examination: Pedal skin thin, shiny and atrophic b/l. No interdigital macerations.   Toenails 1-5 b/l thick, discolored, elongated with subungual debris and pain on dorsal palpation.   Musculoskeletal Examination: Muscle strength 5/5 to b/l LE. Wearing Darco shoe right foot. Utilizes cane for ambulation assistance.  Radiographs: None  Assessment/Plan: 1. Pain due to onychomycosis of nail   2. Type II diabetes mellitus with  peripheral circulatory disorder (HCC)     -Consent given for treatment as described below: -Examined patient. -Toenails 1-5 b/l were debrided in length and girth with sterile nail nippers and dremel without iatrogenic bleeding.  -Patient/POA to call should there be question/concern in the interim.   Return in about 3 months (around 12/31/2022).  Freddie Breech, DPM

## 2022-10-29 ENCOUNTER — Other Ambulatory Visit: Payer: Self-pay | Admitting: Student

## 2022-10-29 DIAGNOSIS — J309 Allergic rhinitis, unspecified: Secondary | ICD-10-CM

## 2022-10-30 ENCOUNTER — Other Ambulatory Visit: Payer: Self-pay | Admitting: Student

## 2022-10-30 DIAGNOSIS — I739 Peripheral vascular disease, unspecified: Secondary | ICD-10-CM

## 2022-12-10 ENCOUNTER — Other Ambulatory Visit: Payer: Self-pay | Admitting: Student

## 2022-12-10 ENCOUNTER — Other Ambulatory Visit: Payer: Self-pay

## 2022-12-10 DIAGNOSIS — J309 Allergic rhinitis, unspecified: Secondary | ICD-10-CM

## 2022-12-10 DIAGNOSIS — I739 Peripheral vascular disease, unspecified: Secondary | ICD-10-CM

## 2022-12-10 MED ORDER — CLOPIDOGREL BISULFATE 75 MG PO TABS: 75.0000 mg | ORAL_TABLET | Freq: Every day | ORAL | 11 refills | Status: DC

## 2022-12-10 MED ORDER — FLUTICASONE PROPIONATE 50 MCG/ACT NA SUSP
NASAL | 4 refills | Status: AC
Start: 2022-12-10 — End: ?

## 2022-12-10 MED ORDER — FLUTICASONE PROPIONATE 50 MCG/ACT NA SUSP: NASAL | 4 refills | Status: DC

## 2022-12-12 ENCOUNTER — Ambulatory Visit (HOSPITAL_COMMUNITY)
Admission: RE | Admit: 2022-12-12 | Discharge: 2022-12-12 | Disposition: A | Discharge status: Home / Self Care | Payer: 59 | Source: Ambulatory Visit | Attending: Student in an Organized Health Care Education/Training Program | Admitting: Student in an Organized Health Care Education/Training Program

## 2022-12-12 DIAGNOSIS — J949 Pleural condition, unspecified: Secondary | ICD-10-CM | POA: Diagnosis not present

## 2022-12-12 DIAGNOSIS — R634 Abnormal weight loss: Secondary | ICD-10-CM | POA: Insufficient documentation

## 2022-12-12 DIAGNOSIS — F17200 Nicotine dependence, unspecified, uncomplicated: Secondary | ICD-10-CM | POA: Insufficient documentation

## 2022-12-16 ENCOUNTER — Ambulatory Visit: Payer: 59 | Attending: Cardiovascular Disease | Admitting: Cardiovascular Disease

## 2022-12-16 ENCOUNTER — Encounter: Payer: Self-pay | Admitting: Cardiovascular Disease

## 2022-12-16 VITALS — BP 118/58 | HR 71 | Ht 63.0 in | Wt 135.0 lb

## 2022-12-16 DIAGNOSIS — I2510 Atherosclerotic heart disease of native coronary artery without angina pectoris: Secondary | ICD-10-CM | POA: Diagnosis not present

## 2022-12-16 DIAGNOSIS — Z720 Tobacco use: Secondary | ICD-10-CM | POA: Diagnosis not present

## 2022-12-16 DIAGNOSIS — I739 Peripheral vascular disease, unspecified: Secondary | ICD-10-CM | POA: Diagnosis not present

## 2022-12-16 DIAGNOSIS — I10 Essential (primary) hypertension: Secondary | ICD-10-CM

## 2022-12-16 DIAGNOSIS — E785 Hyperlipidemia, unspecified: Secondary | ICD-10-CM

## 2022-12-16 MED ORDER — CETIRIZINE HCL 10 MG PO TABS: 10.0000 mg | ORAL_TABLET | Freq: Every day | ORAL | 0 refills | Status: AC

## 2022-12-16 NOTE — Progress Notes (Signed)
Cardiology Office Note   Date:  12/16/2022   ID:  Rose [name], Rose [name] October 07, 1955, MRN [id number]  PCP:  [name], MD  Cardiologist:   Lorine Bears, MD   No chief complaint on file.      History of Present Illness: Rose [name] is a 67 y.o. female who is here today for follow-up visit regarding peripheral arterial disease and coronary artery disease.  The patient has multiple chronic medical conditions that include type 2 diabetes, essential hypertension, hyperlipidemia, remote IV drug use and tobacco use. She is status post bilateral common iliac artery stenting done in March 2021.  She had no significant infrainguinal disease at that time.  She had an ulceration on the right lateral foot at that time.   She was seen in September of 2021 for exertional chest pain.  She underwent a Lexiscan Myoview which showed evidence of anterior and anterolateral ischemia.  Cardiac catheterization in October showed severe one-vessel coronary artery disease with 90% heavily calcified stenosis in the mid LAD and moderate left circumflex disease.  Ejection fraction was normal with mildly elevated left ventricular end-diastolic pressure.  I performed successful intravascular lithotripsy and drug eluting stent placement to the mid LAD.    She was able to quit smoking at some point but she relapsed after the death of her sister.   She was seen recently for a small ulceration at the bottom of the right foot after an injury in early 2024. I proceeded with angiography in March 2024 which showed patent common iliac artery stent with mild in-stent restenosis, mild nonobstructive disease affecting the right SFA and popliteal arteries with three-vessel runoff below the knee.  No revascularization was needed. The ulceration on the right foot healed completely.  She has been doing well with no recent chest pain, shortness of breath or palpitations.  She cut down on tobacco use to 3  cigarettes a day.  Past Medical History:  Diagnosis Date   ACE inhibitor-aggravated angioedema, initial encounter 04/26/2016   Allergic rhinitis    Anxiety    Arthritis    Calculus of gallbladder without cholecystitis without obstruction 10/28/2018   Chronic back pain    CKD (chronic kidney disease), stage III (HCC)    folllowed by pcp   Coronary artery disease    Depression    Diabetic neuropathy (HCC)    Edema of lower extremity 10/28/2018   Encounter for completion of form with patient 06/21/2019   Environmental allergies    Environmental allergies 12/28/2015   Fibromyalgia    Foot ulcer secondary to diabetes 06/21/2019   Frequency of urination    History of chronic bronchitis    History of drug abuse (HCC) states quit herion in 2006   History of hepatitis C last abd ultrasound in epic 11-10-2018;  last hepative panel in epic 11-30-2018 was normal   previously followed by [facility] (notes in care everywhere)-- dx 04/ 2015,  started harvonic treatment 07/ 2015   History of rib fracture 10/28/2018   HPV in female 06/11/2016   Hypertension    followed by pcp  (nuclear stress test 01-15-2017 in epic,  showed low risk normal w/ nuclear ef 55%)   Laceration of right foot 03/04/2019   Lipoma of neck 07/16/2017   Seen by general surgery Dr Carolynne Edouard May 16 - plan for removal of 2cm sebaceous cyst.    Methadone maintenance therapy patient (HCC)    Mild asthma    followed by  pcp   Nocturia    PAD (peripheral artery disease) (HCC)    followed by cardiology, dr Kirke Corin---  06-01-2019  s/p bilateral CIA angioplasty stenting   Recurrent falls 04/30/2016   Right foot ulcer (HCC)    Symptomatic anemia 11/15/2020   Tendonitis    bilateral wrist   Type 2 diabetes mellitus (HCC)    followed by pcp   (06-16-2019  per pt checks blood sugar every other day in am,  fasting sugar 92-95)   Urge urinary incontinence     Past Surgical History:  Procedure Laterality Date   ABDOMINAL  AORTOGRAM W/LOWER EXTREMITY Bilateral 06/01/2019   Procedure: ABDOMINAL AORTOGRAM W/LOWER EXTREMITY;  Surgeon: [name], MD;  Location: [facility];  Service: Cardiovascular;  Laterality: Bilateral;   ABDOMINAL AORTOGRAM W/LOWER EXTREMITY N/A 06/04/2022   Procedure: ABDOMINAL AORTOGRAM W/LOWER EXTREMITY;  Surgeon: [name], MD;  Location: [facility];  Service: Cardiovascular;  Laterality: N/A;   APPENDECTOMY  1972   BREAST BIOPSY Right 09/24/2022   MM RT BREAST BX W LOC DEV 1ST LESION IMAGE BX SPEC STEREO GUIDE 09/24/2022 [facility]   INTERSTIM IMPLANT PLACEMENT  10/23/2011   Procedure: Leane Platt IMPLANT FIRST STAGE;  Surgeon: Martina Sinner, MD;  Location: [facility];  Service: Urology;  Laterality: N/A;   INTERSTIM IMPLANT PLACEMENT  10/23/2011   Procedure: Leane Platt IMPLANT SECOND STAGE;  Surgeon: Martina Sinner, MD;  Location: [facility];  Service: Urology;  Laterality: N/A;   LEFT HEART CATH AND CORONARY ANGIOGRAPHY N/A 01/11/2020   Procedure: LEFT HEART CATH AND CORONARY ANGIOGRAPHY;  Surgeon: [name], MD;  Location: [facility];  Service: Cardiovascular;  Laterality: N/A;   PANACOS TISSUE GRAFT Right 06/24/2019   Procedure: Application of Skin Graft Substitute;  Surgeon: Park Liter, DPM;  Location: [facility];  Service: Podiatry;  Laterality: Right;   PERIPHERAL VASCULAR INTERVENTION Bilateral 06/01/2019   Procedure: PERIPHERAL VASCULAR INTERVENTION;  Surgeon: [name], MD;  Location: [facility];  Service: Cardiovascular;  Laterality: Bilateral;   WOUND DEBRIDEMENT Right 06/24/2019   Procedure: DEBRIDEMENT WOUND;  Surgeon: Park Liter, DPM;  Location: [facility];  Service: Podiatry;  Laterality: Right;   WOUND DEBRIDEMENT Right 08/24/2019   Procedure: DEBRIDEMENT RIGHT FOOT WOUND; APPLICATION OF SKIN GRAFT SUBSTITUTE;  Surgeon: Park Liter,  DPM;  Location: [facility] [place];  Service: Podiatry;  Laterality: Right;     Current Outpatient Medications  Medication Sig Dispense Refill   albuterol (VENTOLIN HFA) 108 (90 Base) MCG/ACT inhaler Inhale 2 puffs into the lungs every 6 (six) hours as needed. For wheezing 1 each 2   aspirin EC 81 MG tablet Take 1 tablet (81 mg total) by mouth daily. Swallow whole. 30 tablet 11   atorvastatin (LIPITOR) 80 MG tablet Take 1 tablet (80 mg total) by mouth daily. 30 tablet 11   carvedilol (COREG) 6.25 MG tablet Take 1 tablet (6.25 mg total) by mouth 2 (two) times daily. 60 tablet 11   cetirizine (ZYRTEC) 10 MG tablet Take 1 tablet (10 mg total) by mouth daily. 30 tablet 11   clopidogrel (PLAVIX) 75 MG tablet Take 1 tablet (75 mg total) by mouth daily. 90 tablet 11   diazepam (VALIUM) 5 MG tablet Take one tablet by mouth with food one hour prior to procedure. May repeat 30 minutes prior if needed. 2 tablet 0   DULoxetine (CYMBALTA)  20 MG capsule TAKE THREE CAPSULES (60 MG) BY MOUTH DAILY AT 9AM 90 capsule 2   fluticasone (FLONASE) 50 MCG/ACT nasal spray SHAKE LIQUID AND INSTILL ONE SPRAY IN EACH NOSTRIL EVERY DAY 16 g 4   GERI-KOT 8.6 MG tablet TAKE TWO TABLETS BY MOUTH DAILY AT 9AM 60 tablet 11   methadone (DOLOPHINE) 10 MG/5ML solution Take 126 mg by mouth daily.      Multiple Vitamins-Minerals (CENTRUM WOMEN) TABS Take 1 tablet by mouth daily.     nicotine polacrilex (NICORETTE) 4 MG gum Take 1 each (4 mg total) by mouth as needed for smoking cessation. 100 tablet 0   Sennosides (SENNA) 8.6 MG CAPS Take 2 tablets by mouth daily. 60 capsule 2   SYNJARDY XR 12-998 MG TB24 TAKE ONE TABLET BY MOUTH DAILY AT 9AM 60 tablet 11   collagenase (SANTYL) 250 UNIT/GM ointment Apply 1 Application topically daily. (Patient not taking: Reported on 12/16/2022) 15 g 0   No current facility-administered medications for this visit.    Allergies:   Lisinopril, Gabapentin, Vicodin  [hydrocodone-acetaminophen], and Hydrocodone-acetaminophen    Social History:  The patient  reports that she has been smoking cigarettes. She has a 10 pack-year smoking history. She has never used smokeless tobacco. She reports that she does not currently use drugs. She reports that she does not drink alcohol.   Family History:  The patient's family history includes CAD in her father; Diabetes type II in her brother, mother, and sister; Heart attack (age of onset: 33) in her brother; Heart attack (age of onset: 50) in her father and mother; Hypertension in her brother and mother.    ROS:  Please see the history of present illness.   Otherwise, review of systems are positive for none.   All other systems are reviewed and negative.    PHYSICAL EXAM: VS:  BP (!) 118/58   Pulse 71   Ht 5\' 3"  (1.6 m)   Wt 135 lb (61.2 kg)   SpO2 98%   BMI 23.91 kg/m  , BMI Body mass index is 23.91 kg/m. GEN: Well nourished, well developed, in no acute distress  HEENT: normal  Neck: no JVD, carotid bruits, or masses Cardiac: RRR; no murmurs, rubs, or gallops, mild bilateral leg edema Respiratory:  clear to auscultation bilaterally, normal work of breathing GI: soft, nontender, nondistended, + BS MS: no deformity or atrophy  Skin: warm and dry, no rash Neuro:  Strength and sensation are intact Psych: euthymic mood, full affect    EKG:  EKG is ordered today. EKG showed: Normal sinus rhythm Septal infarct (cited on or before 19-Apr-2020) When compared with ECG of 05-Sep-2020 10:50, No significant change was found   Recent Labs: 05/20/2022: BUN 17; Creatinine, Ser 1.51; Hemoglobin 11.7; Platelets 278; Potassium 3.7; Sodium 139    Lipid Panel    Component Value Date/Time   CHOL 98 (L) 12/10/2021 1115   TRIG 160 (H) 12/10/2021 1115   HDL 29 (L) 12/10/2021 1115   CHOLHDL 3.4 12/10/2021 1115   LDLCALC 42 12/10/2021 1115      Wt Readings from Last 3 Encounters:  12/16/22 135 lb (61.2 kg)   09/23/22 132 lb (59.9 kg)  09/23/22 132 lb (59.9 kg)           12/19/2016    1:37 PM  PAD Screen  Previous PAD dx? No  Previous surgical procedure? No  Pain with walking? No  Feet/toe relief with dangling? Yes  Painful, non-healing ulcers? No  Extremities discolored? No      ASSESSMENT AND PLAN:  1.  Peripheral arterial disease: Status post  stent placement to bilateral common iliac arteries.  Most recent angiography this year showed patent iliac stents with mild in-stent restenosis.  Continue medical therapy.  I elected to discontinue aspirin and leave her on long-term clopidogrel. Repeat Doppler studies in 6 months.  2. Coronary artery disease involving native coronary arteries without angina : She is doing well with no anginal symptoms.  Continue medical therapy.   3.  Tobacco use: She cut down to 3 cigarettes a day but has not been able to quit completely.  I discussed with her the importance of complete cessation.  4.  Hyperlipidemia: Continue treatment with atorvastatin.  Most recent lipid profile showed an LDL of 42.  5.  Essential hypertension: Blood pressure is controlled.   Disposition:   Follow-up in 6 months.  Signed,  Lorine Bears, MD  12/16/2022 9:08 AM    [facility]

## 2022-12-16 NOTE — Patient Instructions (Signed)
Medication Instructions:  STOP the Aspirin *If you need a refill on your cardiac medications before your next appointment, please call your pharmacy*   Lab Work: None ordered If you have labs (blood work) drawn today and your tests are completely normal, you will receive your results only by: MyChart Message (if you have MyChart) OR A paper copy in the mail If you have any lab test that is abnormal or we need to change your treatment, we will call you to review the results.   Testing/Procedures: None ordered  Follow-Up: At [facility], you and your health needs are our priority.  As part of our continuing mission to provide you with exceptional heart care, we have created designated Provider Care Teams.  These Care Teams include your primary Cardiologist (physician) and Advanced Practice Providers (APPs -  Physician Assistants and Nurse Practitioners) who all work together to provide you with the care you need, when you need it.  We recommend signing up for the patient portal called "MyChart".  Sign up information is provided on this After Visit Summary.  MyChart is used to connect with patients for Virtual Visits (Telemedicine).  Patients are able to view lab/test results, encounter notes, upcoming appointments, etc.  Non-urgent messages can be sent to your provider as well.   To learn more about what you can do with MyChart, go to [email or web address].    Your next appointment:   6 month(s)  Provider:   Lorine Bears, MD

## 2022-12-18 ENCOUNTER — Telehealth: Payer: Self-pay | Admitting: *Deleted

## 2022-12-18 NOTE — Telephone Encounter (Signed)
Left a message for the patient to call back to verify that she is taking her Atorvastatin.

## 2022-12-19 NOTE — Telephone Encounter (Signed)
Left a message for the patient to call back.

## 2022-12-23 NOTE — Telephone Encounter (Signed)
Left a message for the patient to call back.  Atorvastatin has not been picked up since last December according to her pharmacy.

## 2023-01-07 ENCOUNTER — Other Ambulatory Visit: Payer: Self-pay

## 2023-01-07 ENCOUNTER — Ambulatory Visit (INDEPENDENT_AMBULATORY_CARE_PROVIDER_SITE_OTHER): Payer: 59 | Admitting: Podiatry

## 2023-01-07 ENCOUNTER — Ambulatory Visit (HOSPITAL_BASED_OUTPATIENT_CLINIC_OR_DEPARTMENT_OTHER)
Admission: RE | Admit: 2023-01-07 | Discharge: 2023-01-07 | Disposition: A | Discharge status: Home / Self Care | Payer: 59 | Source: Ambulatory Visit | Attending: Cardiovascular Disease | Admitting: Cardiovascular Disease

## 2023-01-07 ENCOUNTER — Ambulatory Visit (HOSPITAL_COMMUNITY)
Admission: RE | Admit: 2023-01-07 | Discharge: 2023-01-07 | Disposition: A | Discharge status: Home / Self Care | Payer: 59 | Source: Ambulatory Visit | Attending: Cardiovascular Disease | Admitting: Cardiovascular Disease

## 2023-01-07 ENCOUNTER — Ambulatory Visit: Payer: 59 | Admitting: Podiatry

## 2023-01-07 DIAGNOSIS — Z95828 Presence of other vascular implants and grafts: Secondary | ICD-10-CM | POA: Diagnosis not present

## 2023-01-07 DIAGNOSIS — Z91199 Patient's noncompliance with other medical treatment and regimen due to unspecified reason: Secondary | ICD-10-CM

## 2023-01-07 DIAGNOSIS — I739 Peripheral vascular disease, unspecified: Secondary | ICD-10-CM | POA: Insufficient documentation

## 2023-01-07 DIAGNOSIS — I25118 Atherosclerotic heart disease of native coronary artery with other forms of angina pectoris: Secondary | ICD-10-CM

## 2023-01-07 MED ORDER — CARVEDILOL 6.25 MG PO TABS
6.2500 mg | ORAL_TABLET | Freq: Two times a day (BID) | ORAL | 11 refills | Status: DC
Start: 2023-01-07 — End: 2023-01-07

## 2023-01-07 MED ORDER — CARVEDILOL 6.25 MG PO TABS
6.2500 mg | ORAL_TABLET | Freq: Two times a day (BID) | ORAL | 11 refills | Status: DC
Start: 2023-01-07 — End: 2023-09-29

## 2023-01-08 LAB — VAS US ABI WITH/WO TBI
Left ABI: 0.92
Right ABI: 0.96

## 2023-01-09 ENCOUNTER — Other Ambulatory Visit (HOSPITAL_COMMUNITY): Payer: Self-pay | Admitting: *Deleted

## 2023-01-09 ENCOUNTER — Encounter: Payer: Self-pay | Admitting: *Deleted

## 2023-01-09 DIAGNOSIS — I739 Peripheral vascular disease, unspecified: Secondary | ICD-10-CM

## 2023-01-11 NOTE — Progress Notes (Signed)
1. No-show for appointment     

## 2023-02-03 ENCOUNTER — Encounter (HOSPITAL_COMMUNITY): Payer: Self-pay

## 2023-02-03 ENCOUNTER — Ambulatory Visit (HOSPITAL_COMMUNITY)
Admission: EM | Admit: 2023-02-03 | Discharge: 2023-02-03 | Disposition: A | Discharge status: Home / Self Care | Payer: 59 | Attending: Family Medicine | Admitting: Family Medicine

## 2023-02-03 DIAGNOSIS — M79641 Pain in right hand: Secondary | ICD-10-CM

## 2023-02-03 DIAGNOSIS — G5603 Carpal tunnel syndrome, bilateral upper limbs: Secondary | ICD-10-CM | POA: Diagnosis not present

## 2023-02-03 DIAGNOSIS — H60502 Unspecified acute noninfective otitis externa, left ear: Secondary | ICD-10-CM

## 2023-02-03 DIAGNOSIS — M79642 Pain in left hand: Secondary | ICD-10-CM | POA: Diagnosis not present

## 2023-02-03 MED ORDER — NEOMYCIN-POLYMYXIN-HC 3.5-10000-1 OT SUSP: 4.0000 [drp] | Freq: Three times a day (TID) | OTIC | 0 refills | Status: AC

## 2023-02-03 NOTE — ED Provider Notes (Signed)
[facility]    CSN: [id number] Arrival date & time: 02/03/23  1314      History   Chief Complaint Chief Complaint  Patient presents with   Otalgia    HPI Rose [name] is a 67 y.o. female.    Otalgia Associated symptoms: ear discharge    Patient is here for left ear pain/drainage for several days.  She usually gets an "ear infection" this time of year due to chronic sinus issues and she smokes.   She just got over flu like symptoms, felt something hard in her ear.  She used a q-tip to get something out, but has continued to drain x 5 days.  It is very painful.   She has been dx with carpal tunnel and tendonitis of both hands.  She was taking an nsaid for this, but this ran out and can't remember what she was given.  She is not having burning in her hands, worse at night.  She is wearing braces which does help some.       Past Medical History:  Diagnosis Date   ACE inhibitor-aggravated angioedema, initial encounter 04/26/2016   Allergic rhinitis    Anxiety    Arthritis    Calculus of gallbladder without cholecystitis without obstruction 10/28/2018   Chronic back pain    CKD (chronic kidney disease), stage III (HCC)    folllowed by pcp   Coronary artery disease    Depression    Diabetic neuropathy (HCC)    Edema of lower extremity 10/28/2018   Encounter for completion of form with patient 06/21/2019   Environmental allergies    Environmental allergies 12/28/2015   Fibromyalgia    Foot ulcer secondary to diabetes 06/21/2019   Frequency of urination    History of chronic bronchitis    History of drug abuse (HCC) states quit herion in 2006   History of hepatitis C last abd ultrasound in epic 11-10-2018;  last hepative panel in epic 11-30-2018 was normal   previously followed by [facility] (notes in care everywhere)-- dx 04/ 2015,  started harvonic treatment 07/ 2015   History of rib fracture 10/28/2018   HPV in female 06/11/2016    Hypertension    followed by pcp  (nuclear stress test 01-15-2017 in epic,  showed low risk normal w/ nuclear ef 55%)   Laceration of right foot 03/04/2019   Lipoma of neck 07/16/2017   Seen by general surgery Dr Carolynne Edouard May 16 - plan for removal of 2cm sebaceous cyst.    Methadone maintenance therapy patient (HCC)    Mild asthma    followed by pcp   Nocturia    PAD (peripheral artery disease) (HCC)    followed by cardiology, dr Kirke Corin---  06-01-2019  s/p bilateral CIA angioplasty stenting   Recurrent falls 04/30/2016   Right foot ulcer (HCC)    Symptomatic anemia 11/15/2020   Tendonitis    bilateral wrist   Type 2 diabetes mellitus (HCC)    followed by pcp   (06-16-2019  per pt checks blood sugar every other day in am,  fasting sugar 92-95)   Urge urinary incontinence     Patient Active Problem List   Diagnosis Date Noted   Unintentional weight loss 09/23/2022   Wound of right foot 03/26/2022   Tension headache 12/10/2021   Therapeutic opioid-induced constipation (OIC) 12/10/2021   Fall 07/01/2021   Iron deficiency anemia 11/15/2020   Hepatic steatosis    CAD s/p DES to  LAD (12/2019) 01/17/2020   PAD s/p PCI with stent 08/18/2019   Allergic rhinitis 10/28/2018   Diabetic neuropathy (HCC) 06/02/2017   Hyperlipidemia associated with type 2 diabetes mellitus (HCC) 12/13/2015   Tobacco use disorder 12/13/2015   Chronic renal insufficiency 12/12/2015   Depression 12/12/2015   Methadone maintenance therapy patient (HCC)    History of substance use disorder    Chronic back pain    Healthcare maintenance 12/14/2014   Type 2 diabetes mellitus (HCC) 02/27/2012   Essential hypertension 02/27/2012    Past Surgical History:  Procedure Laterality Date   ABDOMINAL AORTOGRAM W/LOWER EXTREMITY Bilateral 06/01/2019   Procedure: ABDOMINAL AORTOGRAM W/LOWER EXTREMITY;  Surgeon: [name], MD;  [facility];  Service: Cardiovascular;  Laterality: Bilateral;   ABDOMINAL  AORTOGRAM W/LOWER EXTREMITY N/A 06/04/2022   Procedure: ABDOMINAL AORTOGRAM W/LOWER EXTREMITY;  Surgeon: [name], MD;  [facility];  Service: Cardiovascular;  Laterality: N/A;   APPENDECTOMY  1972   BREAST BIOPSY Right 09/24/2022   MM RT BREAST BX W LOC DEV 1ST LESION IMAGE BX SPEC STEREO GUIDE 09/24/2022 [facility]   INTERSTIM IMPLANT PLACEMENT  10/23/2011   Procedure: Leane Platt IMPLANT FIRST STAGE;  Surgeon: Martina Sinner, MD;  Location: [facility];  Service: Urology;  Laterality: N/A;   INTERSTIM IMPLANT PLACEMENT  10/23/2011   Procedure: Leane Platt IMPLANT SECOND STAGE;  Surgeon: Martina Sinner, MD;  Location: [facility];  Service: Urology;  Laterality: N/A;   LEFT HEART CATH AND CORONARY ANGIOGRAPHY N/A 01/11/2020   Procedure: LEFT HEART CATH AND CORONARY ANGIOGRAPHY;  Surgeon: [name], MD;  [facility];  Service: Cardiovascular;  Laterality: N/A;   PANACOS TISSUE GRAFT Right 06/24/2019   Procedure: Application of Skin Graft Substitute;  Surgeon: Park Liter, DPM;  Location: [facility] [place];  Service: Podiatry;  Laterality: Right;   PERIPHERAL VASCULAR INTERVENTION Bilateral 06/01/2019   Procedure: PERIPHERAL VASCULAR INTERVENTION;  Surgeon: [name], MD;  [facility];  Service: Cardiovascular;  Laterality: Bilateral;   WOUND DEBRIDEMENT Right 06/24/2019   Procedure: DEBRIDEMENT WOUND;  Surgeon: Park Liter, DPM;  Location: [facility] [place];  Service: Podiatry;  Laterality: Right;   WOUND DEBRIDEMENT Right 08/24/2019   Procedure: DEBRIDEMENT RIGHT FOOT WOUND; APPLICATION OF SKIN GRAFT SUBSTITUTE;  Surgeon: Park Liter, DPM;  Location: [facility] [place];  Service: Podiatry;  Laterality: Right;    OB History   No obstetric history on file.      Home Medications    Prior to Admission medications   Medication Sig Start Date  End Date Taking? Authorizing Provider  albuterol (VENTOLIN HFA) 108 (90 Base) MCG/ACT inhaler Inhale 2 puffs into the lungs every 6 (six) hours as needed. For wheezing 12/04/20   Dellia Cloud, MD  atorvastatin (LIPITOR) 80 MG tablet Take 1 tablet (80 mg total) by mouth daily. 03/03/22   Willette Cluster, MD  carvedilol (COREG) 6.25 MG tablet Take 1 tablet (6.25 mg total) by mouth 2 (two) times daily. 01/07/23 01/07/24  [name], MD  cetirizine (ZYRTEC) 10 MG tablet Take 1 tablet (10 mg total) by mouth daily. 12/16/22   [name], MD  clopidogrel (PLAVIX) 75 MG tablet Take 1 tablet (75 mg total) by mouth daily. 12/10/22   [name], MD  collagenase (SANTYL) 250 UNIT/GM ointment Apply 1 Application topically daily. Patient not taking: Reported on 12/16/2022 06/11/22  Candelaria Stagers, DPM  diazepam (VALIUM) 5 MG tablet Take one tablet by mouth with food one hour prior to procedure. May repeat 30 minutes prior if needed. 08/07/22   Juanda Chance, NP  DULoxetine (CYMBALTA) 20 MG capsule TAKE THREE CAPSULES (60 MG) BY MOUTH DAILY AT 9AM 05/28/22   Willette Cluster, MD  fluticasone [facility]) 50 MCG/ACT nasal spray SHAKE LIQUID AND INSTILL ONE SPRAY IN EACH NOSTRIL EVERY DAY 12/10/22   [name], MD  GERI-KOT 8.6 MG tablet TAKE TWO TABLETS BY MOUTH DAILY AT 9AM 06/04/22   Willette Cluster, MD  methadone (DOLOPHINE) 10 MG/5ML solution Take 126 mg by mouth daily.     [provider]  Multiple Vitamins-Minerals (CENTRUM WOMEN) TABS Take 1 tablet by mouth daily.    [provider]  nicotine polacrilex (NICORETTE) 4 MG gum Take 1 each (4 mg total) by mouth as needed for smoking cessation. 06/23/22   Adron Bene, MD  Sennosides (SENNA) 8.6 MG CAPS Take 2 tablets by mouth daily. 12/10/21   Marrianne Mood, MD  SYNJARDY XR 12-998 MG TB24 TAKE ONE TABLET BY MOUTH DAILY AT 9AM 11/06/22   [name], MD    Family History Family History  Problem Relation Age of Onset   Hypertension  Mother    Diabetes type II Mother    Heart attack Mother 32   CAD Father    Heart attack Father 62   Hypertension Brother    Diabetes type II Brother    Heart attack Brother 48   Diabetes type II Sister     Social History Social History   Tobacco Use   Smoking status: Some Days    Current packs/day: 0.50    Average packs/day: 0.5 packs/day for 20.0 years (10.0 ttl pk-yrs)    Types: Cigarettes   Smokeless tobacco: Never   Tobacco comments:    SMOKES 4 CIGS PER DAY  Vaping Use   Vaping status: Never Used  Substance Use Topics   Alcohol use: No    Alcohol/week: 0.0 standard drinks of alcohol   Drug use: Not Currently    Comment: hx heroin use, IV,  last used 2006 (currently on methadone)     Allergies   Lisinopril, Gabapentin, Vicodin [hydrocodone-acetaminophen], and Hydrocodone-acetaminophen   Review of Systems Review of Systems  Constitutional: Negative.   HENT:  Positive for ear discharge and ear pain.   Respiratory: Negative.    Cardiovascular: Negative.   Gastrointestinal: Negative.   Genitourinary: Negative.   Musculoskeletal:  Positive for arthralgias.  Psychiatric/Behavioral: Negative.       Physical Exam Triage Vital Signs ED Triage Vitals [02/03/23 1402]  Encounter Vitals Group     BP (!) 143/67     Systolic BP Percentile      Diastolic BP Percentile      Pulse Rate 80     Resp 16     Temp 97.9 F (36.6 C)     Temp Source Oral     SpO2 98 %     Weight      Height      Head Circumference      Peak Flow      Pain Score 9     Pain Loc      Pain Education      Exclude from Growth Chart    No data found.  Updated Vital Signs BP (!) 143/67 (BP Location: Left Arm)   Pulse 80   Temp 97.9 F (36.6 C) (  Oral)   Resp 16   LMP 07/31/2011   SpO2 98%   Visual Acuity Right Eye Distance:   Left Eye Distance:   Bilateral Distance:    Right Eye Near:   Left Eye Near:    Bilateral Near:     Physical Exam Constitutional:      Appearance:  Normal appearance.  HENT:     Left Ear: Drainage, swelling and tenderness present.  Musculoskeletal:     Comments: Bilateral wrist splints being worn today  Neurological:     General: No focal deficit present.     Mental Status: She is alert.  Psychiatric:        Mood and Affect: Mood normal.      UC Treatments / Results  Labs (all labs ordered are listed, but only abnormal results are displayed) Labs Reviewed - No data to display  EKG   Radiology No results found.  Procedures Procedures (including critical care time)  Medications Ordered in UC Medications - No data to display  Initial Impression / Assessment and Plan / UC Course  I have reviewed the triage vital signs and the nursing notes.  Pertinent labs & imaging results that were available during my care of the patient were reviewed by me and considered in my medical decision making (see chart for details).    Final Clinical Impressions(s) / UC Diagnoses   Final diagnoses:  Acute otitis externa of left ear, unspecified type  Bilateral hand pain  Bilateral carpal tunnel syndrome     Discharge Instructions      You were seen today for several issues.  I have sent out an ear drop for the left ear.  Use for 7 days.  For your carpal tunnel syndrome, you are limited.  You should not take anti-inflammatories because of your kidney function (such as advil or ibuprofen) and you are allergic to gabapentin.  I recommend you follow up with your primary care provider to discuss your options and to see a specialist.  You should continue the wrist braces and you may use ice packs on the wrists to help as well.     ED Prescriptions     Medication Sig Dispense Auth. Provider   neomycin-polymyxin-hydrocortisone (CORTISPORIN) 3.5-10000-1 OTIC suspension Place 4 drops into the left ear 3 (three) times daily for 7 days. 10 mL [name], MD      PDMP not reviewed this encounter.   [name], MD 02/03/23  1422

## 2023-02-03 NOTE — Discharge Instructions (Signed)
You were seen today for several issues.  I have sent out an ear drop for the left ear.  Use for 7 days.  For your carpal tunnel syndrome, you are limited.  You should not take anti-inflammatories because of your kidney function (such as advil or ibuprofen) and you are allergic to gabapentin.  I recommend you follow up with your primary care provider to discuss your options and to see a specialist.  You should continue the wrist braces and you may use ice packs on the wrists to help as well.

## 2023-02-03 NOTE — ED Triage Notes (Signed)
Pt states left ear pain with drainage for the past  5 days. Pt also c/o bilateral hand pain states it is worse at night and feels like her hands are burning.  Pt wearing wrist braces.

## 2023-02-05 ENCOUNTER — Other Ambulatory Visit (HOSPITAL_COMMUNITY): Payer: Self-pay

## 2023-02-05 MED ORDER — NEOMYCIN-POLYMYXIN-HC 3.5-10000-1 OT SUSP
4.0000 [drp] | Freq: Three times a day (TID) | OTIC | 0 refills | Status: AC
  Filled 2023-02-05: qty 10, 17d supply, fill #0

## 2023-02-10 ENCOUNTER — Other Ambulatory Visit: Payer: Self-pay

## 2023-02-10 DIAGNOSIS — G8929 Other chronic pain: Secondary | ICD-10-CM

## 2023-02-10 DIAGNOSIS — E1142 Type 2 diabetes mellitus with diabetic polyneuropathy: Secondary | ICD-10-CM

## 2023-02-11 MED ORDER — DULOXETINE HCL 20 MG PO CPEP: 20.0000 mg | ORAL_CAPSULE | Freq: Every day | ORAL | 2 refills | Status: DC

## 2023-02-11 NOTE — Telephone Encounter (Signed)
Next appt scheduled 11/19 with PCP. 

## 2023-02-17 ENCOUNTER — Encounter: Payer: Self-pay | Admitting: Student

## 2023-02-17 ENCOUNTER — Ambulatory Visit: Payer: 59 | Admitting: Internal Medicine

## 2023-02-17 VITALS — BP 132/63 | HR 73 | Temp 98.1°F | Ht 63.0 in | Wt 137.2 lb

## 2023-02-17 DIAGNOSIS — E11319 Type 2 diabetes mellitus with unspecified diabetic retinopathy without macular edema: Secondary | ICD-10-CM

## 2023-02-17 DIAGNOSIS — E1140 Type 2 diabetes mellitus with diabetic neuropathy, unspecified: Secondary | ICD-10-CM

## 2023-02-17 DIAGNOSIS — Z7984 Long term (current) use of oral hypoglycemic drugs: Secondary | ICD-10-CM | POA: Diagnosis not present

## 2023-02-17 DIAGNOSIS — I10 Essential (primary) hypertension: Secondary | ICD-10-CM

## 2023-02-17 DIAGNOSIS — E118 Type 2 diabetes mellitus with unspecified complications: Secondary | ICD-10-CM

## 2023-02-17 DIAGNOSIS — E785 Hyperlipidemia, unspecified: Secondary | ICD-10-CM

## 2023-02-17 DIAGNOSIS — G8929 Other chronic pain: Secondary | ICD-10-CM

## 2023-02-17 DIAGNOSIS — E1142 Type 2 diabetes mellitus with diabetic polyneuropathy: Secondary | ICD-10-CM

## 2023-02-17 DIAGNOSIS — E1169 Type 2 diabetes mellitus with other specified complication: Secondary | ICD-10-CM | POA: Diagnosis not present

## 2023-02-17 LAB — POCT GLYCOSYLATED HEMOGLOBIN (HGB A1C): Hemoglobin A1C: 6.7 % — AB (ref 4.0–5.6)

## 2023-02-17 LAB — GLUCOSE, CAPILLARY: Glucose-Capillary: 106 mg/dL — ABNORMAL HIGH (ref 70–99)

## 2023-02-17 MED ORDER — DULOXETINE HCL 60 MG PO CPEP: 60.0000 mg | ORAL_CAPSULE | Freq: Every day | ORAL | 1 refills | Status: DC

## 2023-02-17 NOTE — Patient Instructions (Signed)
Thank you, [name] for allowing [place] to provide your care today. Today we discussed:  Diabetes Keep taking Synjardy once a day I have increased your Duloxetine/Cymbalta - take 60 mg daily (3 tablets of 20 mg dose that you have now OR just pick up the new prescription I sent in with the higher dose) - this is to help the numbness/burning in your fingers Keep taking Carvedilol/coreg twice a day for your blood pressure  I have ordered the following labs for you:   Lab Orders         Microalbumin / Creatinine Urine Ratio         Glucose, capillary         POC Hbg A1C       Referrals ordered today:   Referral Orders  No referral(s) requested today     I have ordered the following medication/changed the following medications:   Stop the following medications: Medications Discontinued During This Encounter  Medication Reason   DULoxetine (CYMBALTA) 20 MG capsule Reorder     Start the following medications: Meds ordered this encounter  Medications   DULoxetine (CYMBALTA) 60 MG capsule    Sig: Take 1 capsule (60 mg total) by mouth daily.    Dispense:  30 capsule    Refill:  1     Follow up: 3 months    Should you have any questions or concerns please call the internal medicine clinic at [phone].     Rose [name], D.O. [facility]

## 2023-02-17 NOTE — Assessment & Plan Note (Signed)
BP Readings from Last 3 Encounters:  02/17/23 132/63  02/03/23 (!) 143/67  12/16/22 (!) 118/58   Blood pressure is well controlled on coreg 6.25 mg bid. Will not make any adjustments to her meds at this time.

## 2023-02-17 NOTE — Progress Notes (Signed)
CC: diabetes  HPI:  Rose [name] is a 67 y.o. female living with a history stated below and presents today for a follow up of her diabetes. Please see problem based assessment and plan for additional details.  Past Medical History:  Diagnosis Date   ACE inhibitor-aggravated angioedema, initial encounter 04/26/2016   Allergic rhinitis    Anxiety    Arthritis    Calculus of gallbladder without cholecystitis without obstruction 10/28/2018   Chronic back pain    CKD (chronic kidney disease), stage III (HCC)    folllowed by pcp   Coronary artery disease    Depression    Diabetic neuropathy (HCC)    Edema of lower extremity 10/28/2018   Encounter for completion of form with patient 06/21/2019   Environmental allergies    Environmental allergies 12/28/2015   Fibromyalgia    Foot ulcer secondary to diabetes 06/21/2019   Frequency of urination    History of chronic bronchitis    History of drug abuse (HCC) states quit herion in 2006   History of hepatitis C last abd ultrasound in epic 11-10-2018;  last hepative panel in epic 11-30-2018 was normal   previously followed by [facility] (notes in care everywhere)-- dx 04/ 2015,  started harvonic treatment 07/ 2015   History of rib fracture 10/28/2018   HPV in female 06/11/2016   Hyperlipidemia associated with type 2 diabetes mellitus (HCC) 12/13/2015   Hypertension    followed by pcp  (nuclear stress test 01-15-2017 in epic,  showed low risk normal w/ nuclear ef 55%)   Laceration of right foot 03/04/2019   Lipoma of neck 07/16/2017   Seen by general surgery Dr Carolynne Edouard May 16 - plan for removal of 2cm sebaceous cyst.    Methadone maintenance therapy patient (HCC)    Mild asthma    followed by pcp   Nocturia    PAD (peripheral artery disease) (HCC)    followed by cardiology, dr Kirke Corin---  06-01-2019  s/p bilateral CIA angioplasty stenting   Recurrent falls 04/30/2016   Right foot ulcer (HCC)     Symptomatic anemia 11/15/2020   Tendonitis    bilateral wrist   Type 2 diabetes mellitus (HCC)    followed by pcp   (06-16-2019  per pt checks blood sugar every other day in am,  fasting sugar 92-95)   Urge urinary incontinence     Current Outpatient Medications on File Prior to Visit  Medication Sig Dispense Refill   albuterol (VENTOLIN HFA) 108 (90 Base) MCG/ACT inhaler Inhale 2 puffs into the lungs every 6 (six) hours as needed. For wheezing 1 each 2   atorvastatin (LIPITOR) 80 MG tablet Take 1 tablet (80 mg total) by mouth daily. 30 tablet 11   carvedilol (COREG) 6.25 MG tablet Take 1 tablet (6.25 mg total) by mouth 2 (two) times daily. 60 tablet 11   cetirizine (ZYRTEC) 10 MG tablet Take 1 tablet (10 mg total) by mouth daily. 30 tablet 0   clopidogrel (PLAVIX) 75 MG tablet Take 1 tablet (75 mg total) by mouth daily. 90 tablet 11   collagenase (SANTYL) 250 UNIT/GM ointment Apply 1 Application topically daily. (Patient not taking: Reported on 12/16/2022) 15 g 0   diazepam (VALIUM) 5 MG tablet Take one tablet by mouth with food one hour prior to procedure. May repeat 30 minutes prior if needed. 2 tablet 0   fluticasone (FLONASE) 50 MCG/ACT nasal spray SHAKE LIQUID AND INSTILL ONE SPRAY IN EACH NOSTRIL  EVERY DAY 16 g 4   GERI-KOT 8.6 MG tablet TAKE TWO TABLETS BY MOUTH DAILY AT 9AM 60 tablet 11   methadone (DOLOPHINE) 10 MG/5ML solution Take 126 mg by mouth daily.      Multiple Vitamins-Minerals (CENTRUM WOMEN) TABS Take 1 tablet by mouth daily.     neomycin-polymyxin-hydrocortisone (CORTISPORIN) 3.5-10000-1 OTIC suspension Place 4 drops into the left ear 3 (three) times daily for 7 days. 10 mL 0   nicotine polacrilex (NICORETTE) 4 MG gum Take 1 each (4 mg total) by mouth as needed for smoking cessation. 100 tablet 0   Sennosides (SENNA) 8.6 MG CAPS Take 2 tablets by mouth daily. 60 capsule 2   SYNJARDY XR 12-998 MG TB24 TAKE ONE TABLET BY MOUTH DAILY AT 9AM 60 tablet 11   No current  facility-administered medications on file prior to visit.    Family History  Problem Relation Age of Onset   Hypertension Mother    Diabetes type II Mother    Heart attack Mother 11   CAD Father    Heart attack Father 108   Hypertension Brother    Diabetes type II Brother    Heart attack Brother 49   Diabetes type II Sister     Social History   Socioeconomic History   Marital status: Single    Spouse name: Not on file   Number of children: Not on file   Years of education: Not on file   Highest education level: Not on file  Occupational History   Not on file  Tobacco Use   Smoking status: Some Days    Current packs/day: 0.50    Average packs/day: 0.5 packs/day for 20.0 years (10.0 ttl pk-yrs)    Types: Cigarettes   Smokeless tobacco: Never   Tobacco comments:    SMOKES 4 CIGS PER DAY  Vaping Use   Vaping status: Never Used  Substance and Sexual Activity   Alcohol use: No    Alcohol/week: 0.0 standard drinks of alcohol   Drug use: Not Currently    Comment: hx heroin use, IV,  last used 2006 (currently on methadone)   Sexual activity: Not Currently  Other Topics Concern   Not on file  Social History Narrative   Not on file   Social Determinants of Health   Financial Resource Strain: Low Risk  (09/23/2022)   Overall Financial Resource Strain (CARDIA)    Difficulty of Paying Living Expenses: Not hard at all  Food Insecurity: No Food Insecurity (09/23/2022)   Hunger Vital Sign    Worried About Running Out of Food in the Last Year: Never true    Ran Out of Food in the Last Year: Never true  Transportation Needs: No Transportation Needs (09/23/2022)   PRAPARE - Administrator, Civil Service (Medical): No    Lack of Transportation (Non-Medical): No  Physical Activity: Insufficiently Active (09/23/2022)   Exercise Vital Sign    Days of Exercise per Week: 2 days    Minutes of Exercise per Session: 20 min  Stress: No Stress Concern Present (09/23/2022)    [employer] of Occupational Health - Occupational Stress Questionnaire    Feeling of Stress : Not at all  Social Connections: Moderately Isolated (09/23/2022)   Social Connection and Isolation Panel [NHANES]    Frequency of Communication with Friends and Family: Three times a week    Frequency of Social Gatherings with Friends and Family: Twice a week    Attends Religious Services: More  than 4 times per year    Active Member of Clubs or Organizations: No    Attends Banker Meetings: Never    Marital Status: Never married  Intimate Partner Violence: Not At Risk (09/23/2022)   Humiliation, Afraid, Rape, and Kick questionnaire    Fear of Current or Ex-Partner: No    Emotionally Abused: No    Physically Abused: No    Sexually Abused: No    Review of Systems: ROS negative except for what is noted on the assessment and plan.  Vitals:   02/17/23 1101  BP: 132/63  Pulse: 73  Temp: 98.1 F (36.7 C)  TempSrc: Oral  SpO2: 100%  Weight: 137 lb 3.2 oz (62.2 kg)  Height: 5\' 3"  (1.6 m)    Physical Exam: Constitutional: elderly female sitting in chair, in no acute distress Cardiovascular: regular rate and rhythm, no m/r/g Pulmonary/Chest: normal work of breathing on room air MSK: normal bulk and tone Neurological: alert & oriented x 3, no focal deficit Skin: warm and dry Psych: normal mood and behavior  Assessment & Plan:    Patient discussed with Dr. Cleda Daub  Type 2 diabetes mellitus with complication, without long-term current use of insulin (HCC) A1c remains well controlled at 6.7% on Synjardy 12-998 mg daily. The patient does have complications of her diabetes, including hyperlipidemia, diabetic neuropathy, and retinopathy. Her neuropathy has worsened and she describes burning in her fingers/hands that is not currently controlled on her dose of Cymbalta.  Plan:  - Increase Cymbalta to 60 mg daily - Continue Synjardy once daily - Urine micro  today  Essential hypertension BP Readings from Last 3 Encounters:  02/17/23 132/63  02/03/23 (!) 143/67  12/16/22 (!) 118/58   Blood pressure is well controlled on coreg 6.25 mg bid. Will not make any adjustments to her meds at this time.    Elza Rafter, D.O. [facility], PGY-3 Phone: [phone] Date 02/17/2023 Time 11:40 AM

## 2023-02-17 NOTE — Assessment & Plan Note (Signed)
A1c remains well controlled at 6.7% on Synjardy 12-998 mg daily. The patient does have complications of her diabetes, including hyperlipidemia, diabetic neuropathy, and retinopathy. Her neuropathy has worsened and she describes burning in her fingers/hands that is not currently controlled on her dose of Cymbalta.  Plan:  - Increase Cymbalta to 60 mg daily - Continue Synjardy once daily - Urine micro today

## 2023-02-18 LAB — MICROALBUMIN / CREATININE URINE RATIO
Creatinine, Urine: 51.2 mg/dL
Microalb/Creat Ratio: 27 mg/g{creat} (ref 0–29)
Microalbumin, Urine: 13.8 ug/mL

## 2023-02-18 NOTE — Progress Notes (Signed)
Attempted to call patient regarding normal urine micro results, but unable to reach her.

## 2023-02-19 ENCOUNTER — Other Ambulatory Visit: Payer: Self-pay | Admitting: Pharmacist

## 2023-02-19 ENCOUNTER — Other Ambulatory Visit: Payer: Self-pay

## 2023-02-19 DIAGNOSIS — E1142 Type 2 diabetes mellitus with diabetic polyneuropathy: Secondary | ICD-10-CM

## 2023-02-19 DIAGNOSIS — G8929 Other chronic pain: Secondary | ICD-10-CM

## 2023-02-19 MED ORDER — DULOXETINE HCL 60 MG PO CPEP
60.0000 mg | ORAL_CAPSULE | Freq: Every day | ORAL | 1 refills | Status: DC
Start: 2023-02-19 — End: 2023-04-22

## 2023-02-19 MED ORDER — ATORVASTATIN CALCIUM 80 MG PO TABS: 80.0000 mg | ORAL_TABLET | Freq: Every day | ORAL | 11 refills | Status: DC

## 2023-02-19 NOTE — Telephone Encounter (Signed)
Rx previous sent to incorrect pharmacy Medication sent to pharmacy.

## 2023-02-19 NOTE — Addendum Note (Signed)
Addended by: Elza Rafter on: 02/19/2023 02:40 PM   Modules accepted: Orders

## 2023-02-19 NOTE — Progress Notes (Signed)
Pharmacy Quality Measure Review  This patient is appearing on report for being at risk of failing the measure for Statin Use in Persons with Diabetes (SUPD) medications this calendar year.   Patient has been prescribed atorvastatin 80 mg daily, but this has not been filled since 2023. Prescription expires next week. Will collaborate with Dr. Ned Card to send refills.   Catie Eppie Gibson, PharmD, BCACP, CPP Clinical Pharmacist [facility] [phone]

## 2023-02-24 NOTE — Progress Notes (Signed)
Internal Medicine Clinic Attending  Case discussed with the resident at the time of the visit.  We reviewed the resident's history and exam and pertinent patient test results.  I agree with the assessment, diagnosis, and plan of care documented in the resident's note.  

## 2023-02-25 ENCOUNTER — Telehealth: Payer: Self-pay | Admitting: *Deleted

## 2023-02-25 NOTE — Telephone Encounter (Signed)
Received call from [facility] from [facility] needing clarification of Duloxetine dose. Informed 20 mg was discontinued and started on 60 mg per OV 02/17/23. Stated she will discontinued the 20 mg form their record.

## 2023-03-09 ENCOUNTER — Encounter: Payer: 59 | Admitting: Internal Medicine

## 2023-03-10 ENCOUNTER — Encounter: Payer: 59 | Admitting: Internal Medicine

## 2023-03-10 NOTE — Progress Notes (Deleted)
CC: ***  HPI:  [name] is a 67 y.o. female living with a history stated below and presents today for ***. Please see problem based assessment and plan for additional details.  Past Medical History:  Diagnosis Date   ACE inhibitor-aggravated angioedema, initial encounter 04/26/2016   Allergic rhinitis    Anxiety    Arthritis    Calculus of gallbladder without cholecystitis without obstruction 10/28/2018   Chronic back pain    CKD (chronic kidney disease), stage III (HCC)    folllowed by pcp   Coronary artery disease    Depression    Diabetic neuropathy (HCC)    Edema of lower extremity 10/28/2018   Encounter for completion of form with patient 06/21/2019   Environmental allergies    Environmental allergies 12/28/2015   Fibromyalgia    Foot ulcer secondary to diabetes 06/21/2019   Frequency of urination    History of chronic bronchitis    History of drug abuse (HCC) states quit herion in 2006   History of hepatitis C last abd ultrasound in epic 11-10-2018;  last hepative panel in epic 11-30-2018 was normal   previously followed by [facility] (notes in care everywhere)-- dx 04/ 2015,  started harvonic treatment 07/ 2015   History of rib fracture 10/28/2018   HPV in female 06/11/2016   Hyperlipidemia associated with type 2 diabetes mellitus (HCC) 12/13/2015   Hypertension    followed by pcp  (nuclear stress test 01-15-2017 in epic,  showed low risk normal w/ nuclear ef 55%)   Laceration of right foot 03/04/2019   Lipoma of neck 07/16/2017   Seen by general surgery Dr Carolynne Edouard May 16 - plan for removal of 2cm sebaceous cyst.    Methadone maintenance therapy patient (HCC)    Mild asthma    followed by pcp   Nocturia    PAD (peripheral artery disease) (HCC)    followed by cardiology, dr Kirke Corin---  06-01-2019  s/p bilateral CIA angioplasty stenting   Recurrent falls 04/30/2016   Right foot ulcer (HCC)    Symptomatic anemia 11/15/2020    Tendonitis    bilateral wrist   Type 2 diabetes mellitus (HCC)    followed by pcp   (06-16-2019  per pt checks blood sugar every other day in am,  fasting sugar 92-95)   Urge urinary incontinence     Current Outpatient Medications on File Prior to Visit  Medication Sig Dispense Refill   albuterol (VENTOLIN HFA) 108 (90 Base) MCG/ACT inhaler Inhale 2 puffs into the lungs every 6 (six) hours as needed. For wheezing 1 each 2   atorvastatin (LIPITOR) 80 MG tablet Take 1 tablet (80 mg total) by mouth daily. 30 tablet 11   carvedilol (COREG) 6.25 MG tablet Take 1 tablet (6.25 mg total) by mouth 2 (two) times daily. 60 tablet 11   cetirizine (ZYRTEC) 10 MG tablet Take 1 tablet (10 mg total) by mouth daily. 30 tablet 0   clopidogrel (PLAVIX) 75 MG tablet Take 1 tablet (75 mg total) by mouth daily. 90 tablet 11   collagenase (SANTYL) 250 UNIT/GM ointment Apply 1 Application topically daily. (Patient not taking: Reported on 12/16/2022) 15 g 0   diazepam (VALIUM) 5 MG tablet Take one tablet by mouth with food one hour prior to procedure. May repeat 30 minutes prior if needed. 2 tablet 0   DULoxetine (CYMBALTA) 60 MG capsule Take 1 capsule (60 mg total) by mouth daily. 30 capsule 1   fluticasone (  FLONASE) 50 MCG/ACT nasal spray SHAKE LIQUID AND INSTILL ONE SPRAY IN EACH NOSTRIL EVERY DAY 16 g 4   GERI-KOT 8.6 MG tablet TAKE TWO TABLETS BY MOUTH DAILY AT 9AM 60 tablet 11   methadone (DOLOPHINE) 10 MG/5ML solution Take 126 mg by mouth daily.      Multiple Vitamins-Minerals (CENTRUM WOMEN) TABS Take 1 tablet by mouth daily.     neomycin-polymyxin-hydrocortisone (CORTISPORIN) 3.5-10000-1 OTIC suspension Place 4 drops into the left ear 3 (three) times daily for 7 days. 10 mL 0   nicotine polacrilex (NICORETTE) 4 MG gum Take 1 each (4 mg total) by mouth as needed for smoking cessation. 100 tablet 0   Sennosides (SENNA) 8.6 MG CAPS Take 2 tablets by mouth daily. 60 capsule 2   SYNJARDY XR 12-998 MG TB24 TAKE  ONE TABLET BY MOUTH DAILY AT 9AM 60 tablet 11   No current facility-administered medications on file prior to visit.    Family History  Problem Relation Age of Onset   Hypertension Mother    Diabetes type II Mother    Heart attack Mother 79   CAD Father    Heart attack Father 89   Hypertension Brother    Diabetes type II Brother    Heart attack Brother 61   Diabetes type II Sister     Social History   Socioeconomic History   Marital status: Single    Spouse name: Not on file   Number of children: Not on file   Years of education: Not on file   Highest education level: Not on file  Occupational History   Not on file  Tobacco Use   Smoking status: Some Days    Current packs/day: 0.50    Average packs/day: 0.5 packs/day for 20.0 years (10.0 ttl pk-yrs)    Types: Cigarettes   Smokeless tobacco: Never   Tobacco comments:    SMOKES 4 CIGS PER DAY  Vaping Use   Vaping status: Never Used  Substance and Sexual Activity   Alcohol use: No    Alcohol/week: 0.0 standard drinks of alcohol   Drug use: Not Currently    Comment: hx heroin use, IV,  last used 2006 (currently on methadone)   Sexual activity: Not Currently  Other Topics Concern   Not on file  Social History Narrative   Not on file   Social Determinants of Health   Financial Resource Strain: Low Risk  (09/23/2022)   Overall Financial Resource Strain (CARDIA)    Difficulty of Paying Living Expenses: Not hard at all  Food Insecurity: No Food Insecurity (09/23/2022)   Hunger Vital Sign    Worried About Running Out of Food in the Last Year: Never true    Ran Out of Food in the Last Year: Never true  Transportation Needs: No Transportation Needs (09/23/2022)   PRAPARE - Administrator, Civil Service (Medical): No    Lack of Transportation (Non-Medical): No  Physical Activity: Insufficiently Active (09/23/2022)   Exercise Vital Sign    Days of Exercise per Week: 2 days    Minutes of Exercise per Session:  20 min  Stress: No Stress Concern Present (09/23/2022)   [employer] of Occupational Health - Occupational Stress Questionnaire    Feeling of Stress : Not at all  Social Connections: Moderately Isolated (09/23/2022)   Social Connection and Isolation Panel [NHANES]    Frequency of Communication with Friends and Family: Three times a week    Frequency of Social Gatherings  with Friends and Family: Twice a week    Attends Religious Services: More than 4 times per year    Active Member of Clubs or Organizations: No    Attends Banker Meetings: Never    Marital Status: Never married  Intimate Partner Violence: Not At Risk (09/23/2022)   Humiliation, Afraid, Rape, and Kick questionnaire    Fear of Current or Ex-Partner: No    Emotionally Abused: No    Physically Abused: No    Sexually Abused: No    Review of Systems: ROS negative except for what is noted on the assessment and plan.  There were no vitals filed for this visit.  Physical Exam: Constitutional: well-appearing *** sitting in ***, in no acute distress HENT: normocephalic atraumatic, mucous membranes moist Eyes: conjunctiva non-erythematous Cardiovascular: regular rate and rhythm, no m/r/g Pulmonary/Chest: normal work of breathing on room air, lungs clear to auscultation bilaterally Abdominal: soft, non-tender, non-distended MSK: normal bulk and tone Neurological: alert & oriented x 3, no focal deficit Skin: warm and dry Psych: normal mood and behavior  Assessment & Plan:   Left foot swelling / redness  Patient {GC/GE:[id number]::"discussed with","seen with"} Dr. {JYNWG:[id number]::"QMVHQION","G. Hoffman","Mullen","Narendra","Vincent","Guilloud","Lau","Machen"}  No problem-specific Assessment & Plan notes found for this encounter.   Elza Rafter, D.O. [facility] Internal Medicine, PGY-3 Phone: [phone] Date 03/10/2023 Time 7:21 AM

## 2023-03-22 ENCOUNTER — Encounter (HOSPITAL_COMMUNITY): Payer: 59

## 2023-03-22 ENCOUNTER — Emergency Department (HOSPITAL_COMMUNITY): Payer: 59

## 2023-03-22 ENCOUNTER — Other Ambulatory Visit: Payer: Self-pay

## 2023-03-22 ENCOUNTER — Inpatient Hospital Stay (HOSPITAL_COMMUNITY)
Admission: EM | Admit: 2023-03-22 | Discharge: 2023-03-25 | DRG: 617 | Disposition: A | Discharge status: Home Health | Payer: 59 | Attending: Infectious Diseases | Admitting: Infectious Diseases

## 2023-03-22 ENCOUNTER — Inpatient Hospital Stay (HOSPITAL_COMMUNITY): Payer: 59

## 2023-03-22 ENCOUNTER — Encounter (HOSPITAL_COMMUNITY): Payer: Self-pay | Admitting: Emergency Medicine

## 2023-03-22 DIAGNOSIS — I10 Essential (primary) hypertension: Secondary | ICD-10-CM | POA: Diagnosis present

## 2023-03-22 DIAGNOSIS — Z885 Allergy status to narcotic agent status: Secondary | ICD-10-CM

## 2023-03-22 DIAGNOSIS — N1831 Chronic kidney disease, stage 3a: Secondary | ICD-10-CM | POA: Insufficient documentation

## 2023-03-22 DIAGNOSIS — M797 Fibromyalgia: Secondary | ICD-10-CM | POA: Diagnosis present

## 2023-03-22 DIAGNOSIS — E1142 Type 2 diabetes mellitus with diabetic polyneuropathy: Secondary | ICD-10-CM | POA: Diagnosis present

## 2023-03-22 DIAGNOSIS — F17210 Nicotine dependence, cigarettes, uncomplicated: Secondary | ICD-10-CM | POA: Diagnosis present

## 2023-03-22 DIAGNOSIS — Z7902 Long term (current) use of antithrombotics/antiplatelets: Secondary | ICD-10-CM

## 2023-03-22 DIAGNOSIS — N179 Acute kidney failure, unspecified: Secondary | ICD-10-CM | POA: Insufficient documentation

## 2023-03-22 DIAGNOSIS — E1122 Type 2 diabetes mellitus with diabetic chronic kidney disease: Secondary | ICD-10-CM | POA: Diagnosis present

## 2023-03-22 DIAGNOSIS — Z8249 Family history of ischemic heart disease and other diseases of the circulatory system: Secondary | ICD-10-CM

## 2023-03-22 DIAGNOSIS — M869 Osteomyelitis, unspecified: Principal | ICD-10-CM | POA: Diagnosis present

## 2023-03-22 DIAGNOSIS — I96 Gangrene, not elsewhere classified: Secondary | ICD-10-CM | POA: Diagnosis present

## 2023-03-22 DIAGNOSIS — Z79899 Other long term (current) drug therapy: Secondary | ICD-10-CM | POA: Diagnosis not present

## 2023-03-22 DIAGNOSIS — F32A Depression, unspecified: Secondary | ICD-10-CM | POA: Diagnosis present

## 2023-03-22 DIAGNOSIS — M19071 Primary osteoarthritis, right ankle and foot: Secondary | ICD-10-CM | POA: Diagnosis not present

## 2023-03-22 DIAGNOSIS — L03115 Cellulitis of right lower limb: Secondary | ICD-10-CM | POA: Diagnosis present

## 2023-03-22 DIAGNOSIS — E1169 Type 2 diabetes mellitus with other specified complication: Secondary | ICD-10-CM | POA: Diagnosis present

## 2023-03-22 DIAGNOSIS — E1152 Type 2 diabetes mellitus with diabetic peripheral angiopathy with gangrene: Secondary | ICD-10-CM | POA: Diagnosis present

## 2023-03-22 DIAGNOSIS — E1151 Type 2 diabetes mellitus with diabetic peripheral angiopathy without gangrene: Secondary | ICD-10-CM | POA: Diagnosis not present

## 2023-03-22 DIAGNOSIS — E785 Hyperlipidemia, unspecified: Secondary | ICD-10-CM | POA: Diagnosis present

## 2023-03-22 DIAGNOSIS — M86171 Other acute osteomyelitis, right ankle and foot: Secondary | ICD-10-CM | POA: Diagnosis not present

## 2023-03-22 DIAGNOSIS — I129 Hypertensive chronic kidney disease with stage 1 through stage 4 chronic kidney disease, or unspecified chronic kidney disease: Secondary | ICD-10-CM | POA: Diagnosis present

## 2023-03-22 DIAGNOSIS — F1120 Opioid dependence, uncomplicated: Secondary | ICD-10-CM | POA: Diagnosis present

## 2023-03-22 DIAGNOSIS — E13621 Other specified diabetes mellitus with foot ulcer: Principal | ICD-10-CM

## 2023-03-22 DIAGNOSIS — L03031 Cellulitis of right toe: Secondary | ICD-10-CM | POA: Diagnosis present

## 2023-03-22 DIAGNOSIS — I878 Other specified disorders of veins: Secondary | ICD-10-CM | POA: Diagnosis present

## 2023-03-22 DIAGNOSIS — Z888 Allergy status to other drugs, medicaments and biological substances status: Secondary | ICD-10-CM

## 2023-03-22 DIAGNOSIS — L97519 Non-pressure chronic ulcer of other part of right foot with unspecified severity: Secondary | ICD-10-CM | POA: Diagnosis present

## 2023-03-22 DIAGNOSIS — Z955 Presence of coronary angioplasty implant and graft: Secondary | ICD-10-CM | POA: Diagnosis not present

## 2023-03-22 DIAGNOSIS — S91301A Unspecified open wound, right foot, initial encounter: Secondary | ICD-10-CM | POA: Diagnosis present

## 2023-03-22 DIAGNOSIS — E118 Type 2 diabetes mellitus with unspecified complications: Secondary | ICD-10-CM | POA: Diagnosis present

## 2023-03-22 DIAGNOSIS — Z560 Unemployment, unspecified: Secondary | ICD-10-CM

## 2023-03-22 DIAGNOSIS — E11622 Type 2 diabetes mellitus with other skin ulcer: Secondary | ICD-10-CM | POA: Diagnosis not present

## 2023-03-22 DIAGNOSIS — G8929 Other chronic pain: Secondary | ICD-10-CM | POA: Diagnosis present

## 2023-03-22 DIAGNOSIS — E11621 Type 2 diabetes mellitus with foot ulcer: Secondary | ICD-10-CM | POA: Diagnosis present

## 2023-03-22 DIAGNOSIS — I739 Peripheral vascular disease, unspecified: Secondary | ICD-10-CM | POA: Diagnosis present

## 2023-03-22 DIAGNOSIS — Z833 Family history of diabetes mellitus: Secondary | ICD-10-CM

## 2023-03-22 DIAGNOSIS — I2510 Atherosclerotic heart disease of native coronary artery without angina pectoris: Secondary | ICD-10-CM | POA: Diagnosis present

## 2023-03-22 LAB — COMPREHENSIVE METABOLIC PANEL
ALT: 10 U/L (ref 0–44)
AST: 15 U/L (ref 15–41)
Albumin: 3.2 g/dL — ABNORMAL LOW (ref 3.5–5.0)
Alkaline Phosphatase: 63 U/L (ref 38–126)
Anion gap: 11 (ref 5–15)
BUN: 19 mg/dL (ref 8–23)
CO2: 24 mmol/L (ref 22–32)
Calcium: 8.9 mg/dL (ref 8.9–10.3)
Chloride: 101 mmol/L (ref 98–111)
Creatinine, Ser: 1.86 mg/dL — ABNORMAL HIGH (ref 0.44–1.00)
GFR, Estimated: 30 mL/min — ABNORMAL LOW (ref >60)
Glucose, Bld: 181 mg/dL — ABNORMAL HIGH (ref 70–99)
Potassium: 3.3 mmol/L — ABNORMAL LOW (ref 3.5–5.1)
Sodium: 136 mmol/L (ref 135–145)
Total Bilirubin: 0.5 mg/dL (ref <1.2)
Total Protein: 7.5 g/dL (ref 6.5–8.1)

## 2023-03-22 LAB — HIV ANTIBODY (ROUTINE TESTING W REFLEX): HIV Screen 4th Generation wRfx: NONREACTIVE

## 2023-03-22 LAB — CBC WITH DIFFERENTIAL/PLATELET
Abs Immature Granulocytes: 0.04 10*3/uL (ref 0.00–0.07)
Basophils Absolute: 0.1 10*3/uL (ref 0.0–0.1)
Basophils Relative: 0 %
Eosinophils Absolute: 0.2 10*3/uL (ref 0.0–0.5)
Eosinophils Relative: 1 %
HCT: 32.3 % — ABNORMAL LOW (ref 36.0–46.0)
Hemoglobin: 9.7 g/dL — ABNORMAL LOW (ref 12.0–15.0)
Immature Granulocytes: 0 %
Lymphocytes Relative: 17 %
Lymphs Abs: 2.1 10*3/uL (ref 0.7–4.0)
MCH: 23.8 pg — ABNORMAL LOW (ref 26.0–34.0)
MCHC: 30 g/dL (ref 30.0–36.0)
MCV: 79.4 fL — ABNORMAL LOW (ref 80.0–100.0)
Monocytes Absolute: 0.9 10*3/uL (ref 0.1–1.0)
Monocytes Relative: 7 %
Neutro Abs: 9.2 10*3/uL — ABNORMAL HIGH (ref 1.7–7.7)
Neutrophils Relative %: 75 %
Platelets: 326 10*3/uL (ref 150–400)
RBC: 4.07 MIL/uL (ref 3.87–5.11)
RDW: 14.3 % (ref 11.5–15.5)
WBC: 12.4 10*3/uL — ABNORMAL HIGH (ref 4.0–10.5)
nRBC: 0 % (ref 0.0–0.2)

## 2023-03-22 LAB — SEDIMENTATION RATE: Sed Rate: 70 mm/h — ABNORMAL HIGH (ref 0–22)

## 2023-03-22 LAB — I-STAT CG4 LACTIC ACID, ED: Lactic Acid, Venous: 1.2 mmol/L (ref 0.5–1.9)

## 2023-03-22 LAB — C-REACTIVE PROTEIN: CRP: 5.5 mg/dL — ABNORMAL HIGH (ref <1.0)

## 2023-03-22 MED ORDER — CARVEDILOL 6.25 MG PO TABS
6.2500 mg | ORAL_TABLET | Freq: Two times a day (BID) | ORAL | Status: DC
  Administered 2023-03-22 – 2023-03-25 (×6): 6.25 mg via ORAL
  Filled 2023-03-22 (×2): qty 1
  Filled 2023-03-22: qty 2
  Filled 2023-03-22: qty 1
  Filled 2023-03-22: qty 2
  Filled 2023-03-22: qty 1

## 2023-03-22 MED ORDER — FLUTICASONE PROPIONATE 50 MCG/ACT NA SUSP: 1.0000 | Freq: Every day | NASAL | Status: DC | PRN

## 2023-03-22 MED ORDER — CENTRUM WOMEN PO TABS: 1.0000 | ORAL_TABLET | Freq: Every day | ORAL | Status: DC

## 2023-03-22 MED ORDER — METHADONE HCL 10 MG/5ML PO SOLN: 115.0000 mg | Freq: Every day | ORAL | Status: DC

## 2023-03-22 MED ORDER — ALBUTEROL SULFATE (2.5 MG/3ML) 0.083% IN NEBU: 2.5000 mg | INHALATION_SOLUTION | Freq: Four times a day (QID) | RESPIRATORY_TRACT | Status: DC | PRN

## 2023-03-22 MED ORDER — DULOXETINE HCL 30 MG PO CPEP: 60.0000 mg | ORAL_CAPSULE | Freq: Every day | ORAL | Status: DC

## 2023-03-22 MED ORDER — ENOXAPARIN SODIUM 40 MG/0.4ML IJ SOSY: 40.0000 mg | PREFILLED_SYRINGE | INTRAMUSCULAR | Status: DC

## 2023-03-22 MED ORDER — DULOXETINE HCL 60 MG PO CPEP
60.0000 mg | ORAL_CAPSULE | Freq: Every day | ORAL | Status: DC
  Administered 2023-03-23 – 2023-03-25 (×3): 60 mg via ORAL
  Filled 2023-03-22: qty 2
  Filled 2023-03-22 (×2): qty 1

## 2023-03-22 MED ORDER — POTASSIUM CHLORIDE CRYS ER 20 MEQ PO TBCR
40.0000 meq | EXTENDED_RELEASE_TABLET | Freq: Once | ORAL | Status: AC
  Administered 2023-03-22: 40 meq via ORAL
  Filled 2023-03-22: qty 2

## 2023-03-22 MED ORDER — PIPERACILLIN-TAZOBACTAM 3.375 G IVPB 30 MIN: 3.3750 g | Freq: Once | INTRAVENOUS | Status: DC

## 2023-03-22 MED ORDER — ENOXAPARIN SODIUM 30 MG/0.3ML IJ SOSY: 30.0000 mg | PREFILLED_SYRINGE | INTRAMUSCULAR | Status: DC

## 2023-03-22 MED ORDER — ADULT MULTIVITAMIN W/MINERALS CH
1.0000 | ORAL_TABLET | Freq: Every day | ORAL | Status: DC
Start: 2023-03-23 — End: 2023-03-25
  Administered 2023-03-23 – 2023-03-25 (×3): 1 via ORAL
  Filled 2023-03-22 (×4): qty 1

## 2023-03-22 MED ORDER — LORATADINE 10 MG PO TABS
10.0000 mg | ORAL_TABLET | Freq: Every day | ORAL | Status: DC
  Administered 2023-03-23 – 2023-03-25 (×3): 10 mg via ORAL
  Filled 2023-03-22 (×3): qty 1

## 2023-03-22 MED ORDER — ATORVASTATIN CALCIUM 40 MG PO TABS: 80.0000 mg | ORAL_TABLET | Freq: Every day | ORAL | Status: DC

## 2023-03-22 MED ORDER — ATORVASTATIN CALCIUM 80 MG PO TABS
80.0000 mg | ORAL_TABLET | Freq: Every day | ORAL | Status: DC
  Administered 2023-03-23 – 2023-03-25 (×3): 80 mg via ORAL
  Filled 2023-03-22: qty 1
  Filled 2023-03-22: qty 2
  Filled 2023-03-22: qty 1

## 2023-03-22 MED ORDER — LORATADINE 10 MG PO TABS: 10.0000 mg | ORAL_TABLET | Freq: Every day | ORAL | Status: DC

## 2023-03-22 MED ORDER — SENNA 8.6 MG PO TABS
2.0000 | ORAL_TABLET | Freq: Every day | ORAL | Status: DC
Start: 2023-03-23 — End: 2023-03-25
  Administered 2023-03-23 – 2023-03-24 (×2): 17.2 mg via ORAL
  Filled 2023-03-22 (×4): qty 2

## 2023-03-22 MED ORDER — NICOTINE 7 MG/24HR TD PT24
7.0000 mg | MEDICATED_PATCH | Freq: Once | TRANSDERMAL | Status: AC
  Administered 2023-03-22: 7 mg via TRANSDERMAL
  Filled 2023-03-22: qty 1

## 2023-03-22 NOTE — ED Triage Notes (Signed)
Pt states 2 weeks ago stumped toe on curb. Pt has ulcer with drainage on bottom of right great toe. Pt also complains of  lower back pain radiates from side to side x 2 days. Denies any injury to back.

## 2023-03-22 NOTE — ED Notes (Signed)
ED TO INPATIENT HANDOFF REPORT  ED Nurse Name and Phone #: [phone]  S Name/Age/Gender Rose [name] 67 y.o. female Room/Bed: 022C/022C  Code Status   Code Status: Full Code  Home/SNF/Other Home Patient oriented to: self, place, time, and situation Is this baseline? Yes   Triage Complete: Triage complete  Chief Complaint Osteomyelitis of great toe of right foot (HCC) [M86.9]  Triage Note Pt states 2 weeks ago stumped toe on curb. Pt has ulcer with drainage on bottom of right great toe. Pt also complains of  lower back pain radiates from side to side x 2 days. Denies any injury to back.    Allergies Allergies  Allergen Reactions   Lisinopril Swelling    Angioedema   Gabapentin Hives and Other (See Comments)    Only when takes over 300 mg dose. "gives me the shakes"   Vicodin [Hydrocodone-Acetaminophen] Hives, Diarrhea and Other (See Comments)    Stomach  cramps   Hydrocodone-Acetaminophen     Level of Care/Admitting Diagnosis ED Disposition     ED Disposition  Admit   Condition  --   Comment  Hospital Area: [facility] [[id number]]  Level of Care: Med-Surg [[id number]]  May admit patient to [facility] or [facility] if equivalent level of care is available:: No  Covid Evaluation: Asymptomatic - no recent exposure (last 10 days) testing not required  Diagnosis: Osteomyelitis of great toe of right foot [facility]) [[id number]]  Admitting Physician: Gust Rung [[id number]]  Attending Physician: Gust Rung [[id number]]  Certification:: I certify this patient will need inpatient services for at least 2 midnights  Expected Medical Readiness: 03/25/2023          B Medical/Surgery History Past Medical History:  Diagnosis Date   ACE inhibitor-aggravated angioedema, initial encounter 04/26/2016   Allergic rhinitis    Anxiety    Arthritis    Calculus of gallbladder without cholecystitis without obstruction 10/28/2018   Chronic back pain    CKD (chronic  kidney disease), stage III (HCC)    folllowed by pcp   Coronary artery disease    Depression    Diabetic neuropathy (HCC)    Edema of lower extremity 10/28/2018   Encounter for completion of form with patient 06/21/2019   Environmental allergies    Environmental allergies 12/28/2015   Fibromyalgia    Foot ulcer secondary to diabetes 06/21/2019   Frequency of urination    History of chronic bronchitis    History of drug abuse (HCC) states quit herion in 2006   History of hepatitis C last abd ultrasound in epic 11-10-2018;  last hepative panel in epic 11-30-2018 was normal   previously followed by [facility] (notes in care everywhere)-- dx 04/ 2015,  started harvonic treatment 07/ 2015   History of rib fracture 10/28/2018   HPV in female 06/11/2016   Hyperlipidemia associated with type 2 diabetes mellitus (HCC) 12/13/2015   Hypertension    followed by pcp  (nuclear stress test 01-15-2017 in epic,  showed low risk normal w/ nuclear ef 55%)   Laceration of right foot 03/04/2019   Lipoma of neck 07/16/2017   Seen by general surgery Dr Carolynne Edouard May 16 - plan for removal of 2cm sebaceous cyst.    Methadone maintenance therapy patient (HCC)    Mild asthma    followed by pcp   Nocturia    PAD (peripheral artery disease) (HCC)    followed by cardiology, dr Kirke Corin---  06-01-2019  s/p  bilateral CIA angioplasty stenting   Recurrent falls 04/30/2016   Right foot ulcer (HCC)    Symptomatic anemia 11/15/2020   Tendonitis    bilateral wrist   Type 2 diabetes mellitus (HCC)    followed by pcp   (06-16-2019  per pt checks blood sugar every other day in am,  fasting sugar 92-95)   Urge urinary incontinence    Past Surgical History:  Procedure Laterality Date   ABDOMINAL AORTOGRAM W/LOWER EXTREMITY Bilateral 06/01/2019   Procedure: ABDOMINAL AORTOGRAM W/LOWER EXTREMITY;  Surgeon: [name], MD;  Location: [facility];  Service: Cardiovascular;  Laterality: Bilateral;    ABDOMINAL AORTOGRAM W/LOWER EXTREMITY N/A 06/04/2022   Procedure: ABDOMINAL AORTOGRAM W/LOWER EXTREMITY;  Surgeon: [name], MD;  Location: [facility];  Service: Cardiovascular;  Laterality: N/A;   APPENDECTOMY  1972   BREAST BIOPSY Right 09/24/2022   MM RT BREAST BX W LOC DEV 1ST LESION IMAGE BX SPEC STEREO GUIDE 09/24/2022 [facility] MAMMOGRAPHY   INTERSTIM IMPLANT PLACEMENT  10/23/2011   Procedure: Leane Platt IMPLANT FIRST STAGE;  Surgeon: Martina Sinner, MD;  Location: [facility];  Service: Urology;  Laterality: N/A;   INTERSTIM IMPLANT PLACEMENT  10/23/2011   Procedure: Leane Platt IMPLANT SECOND STAGE;  Surgeon: Martina Sinner, MD;  Location: [facility];  Service: Urology;  Laterality: N/A;   LEFT HEART CATH AND CORONARY ANGIOGRAPHY N/A 01/11/2020   Procedure: LEFT HEART CATH AND CORONARY ANGIOGRAPHY;  Surgeon: [name], MD;  Location: [facility];  Service: Cardiovascular;  Laterality: N/A;   PANACOS TISSUE GRAFT Right 06/24/2019   Procedure: Application of Skin Graft Substitute;  Surgeon: Park Liter, DPM;  Location: [facility];  Service: Podiatry;  Laterality: Right;   PERIPHERAL VASCULAR INTERVENTION Bilateral 06/01/2019   Procedure: PERIPHERAL VASCULAR INTERVENTION;  Surgeon: [name], MD;  Location: [facility];  Service: Cardiovascular;  Laterality: Bilateral;   WOUND DEBRIDEMENT Right 06/24/2019   Procedure: DEBRIDEMENT WOUND;  Surgeon: Park Liter, DPM;  Location: [facility];  Service: Podiatry;  Laterality: Right;   WOUND DEBRIDEMENT Right 08/24/2019   Procedure: DEBRIDEMENT RIGHT FOOT WOUND; APPLICATION OF SKIN GRAFT SUBSTITUTE;  Surgeon: Park Liter, DPM;  Location: [facility];  Service: Podiatry;  Laterality: Right;     A IV Location/Drains/Wounds Patient Lines/Drains/Airways Status     Active Line/Drains/Airways     Name Placement date  Placement time Site Days   Peripheral IV 03/22/23 20 G Anterior;Distal;Right;Upper Arm 03/22/23  1625  Arm  less than 1   Wound / Incision (Open or Dehisced) 11/15/20 Laceration Pretibial Left 11/15/20  1851  Pretibial  857            Intake/Output Last 24 hours No intake or output data in the 24 hours ending 03/22/23 1819  Labs/Imaging Results for orders placed or performed during the hospital encounter of 03/22/23 (from the past 48 hours)  Sedimentation rate     Status: Abnormal   Collection Time: 03/22/23 10:45 AM  Result Value Ref Range   Sed Rate 70 (H) 0 - 22 mm/hr    Comment: Performed at [facility], [address], [place], [place] [address]  C-reactive protein     Status: Abnormal   Collection Time: 03/22/23 10:45 AM  Result Value Ref Range   CRP 5.5 (H) <1.0 mg/dL    Comment: Performed at [facility], [address] [place], [place] [address]  Comprehensive metabolic panel     Status: Abnormal   Collection Time: 03/22/23  3:29 PM  Result Value Ref Range   Sodium 136 135 - 145 mmol/L   Potassium 3.3 (L) 3.5 - 5.1 mmol/L   Chloride 101 98 - 111 mmol/L   CO2 24 22 - 32 mmol/L   Glucose, Bld 181 (H) 70 - 99 mg/dL    Comment: Glucose reference range applies only to samples taken after fasting for at least 8 hours.   BUN 19 8 - 23 mg/dL   Creatinine, Ser 6.04 (H) 0.44 - 1.00 mg/dL   Calcium 8.9 8.9 - 54.0 mg/dL   Total Protein 7.5 6.5 - 8.1 g/dL   Albumin 3.2 (L) 3.5 - 5.0 g/dL   AST 15 15 - 41 U/L   ALT 10 0 - 44 U/L   Alkaline Phosphatase 63 38 - 126 U/L   Total Bilirubin 0.5 <1.2 mg/dL   GFR, Estimated 30 (L) >60 mL/min    Comment: (NOTE) Calculated using the CKD-EPI Creatinine Equation (2021)    Anion gap 11 5 - 15    Comment: Performed at [facility], [address], [place], [place] [address]  CBC with Differential     Status: Abnormal   Collection Time: 03/22/23  3:29 PM  Result Value Ref Range   WBC 12.4 (H) 4.0 - 10.5 K/uL   RBC  4.07 3.87 - 5.11 MIL/uL   Hemoglobin 9.7 (L) 12.0 - 15.0 g/dL   HCT 14.7 (L) 82.9 - 56.2 %   MCV 79.4 (L) 80.0 - 100.0 fL   MCH 23.8 (L) 26.0 - 34.0 pg   MCHC 30.0 30.0 - 36.0 g/dL   RDW 13.0 86.5 - 78.4 %   Platelets 326 150 - 400 K/uL   nRBC 0.0 0.0 - 0.2 %   Neutrophils Relative % 75 %   Neutro Abs 9.2 (H) 1.7 - 7.7 K/uL   Lymphocytes Relative 17 %   Lymphs Abs 2.1 0.7 - 4.0 K/uL   Monocytes Relative 7 %   Monocytes Absolute 0.9 0.1 - 1.0 K/uL   Eosinophils Relative 1 %   Eosinophils Absolute 0.2 0.0 - 0.5 K/uL   Basophils Relative 0 %   Basophils Absolute 0.1 0.0 - 0.1 K/uL   Immature Granulocytes 0 %   Abs Immature Granulocytes 0.04 0.00 - 0.07 K/uL    Comment: Performed at [facility], [address] [place], [place] [address]  I-Stat Lactic Acid, ED     Status: None   Collection Time: 03/22/23  4:00 PM  Result Value Ref Range   Lactic Acid, Venous 1.2 0.5 - 1.9 mmol/L   DG Foot Complete Right Result Date: 03/22/2023 CLINICAL DATA:  Right first toe ulcer evaluation. EXAM: RIGHT FOOT COMPLETE - 3+ VIEW COMPARISON:  01/06/2020 FINDINGS: Evidence of patient's soft tissue ulceration over the distal first toe. There is moderate underlying bone destruction involving the head of the first distal phalanx compatible with osteomyelitis. Remaining bony structures and joint spaces are unremarkable. IMPRESSION: Soft tissue ulceration over the distal first toe with moderate underlying bone destruction involving the head of the first distal phalanx compatible with osteomyelitis. Electronically Signed   By: Elberta Fortis M.D.   On: 03/22/2023 17:06    Pending Labs Unresulted Labs (From admission, onward)     Start     Ordered   03/22/23 1804  HIV Antibody (routine testing w rflx)  (  HIV Antibody (Routine testing w reflex) panel)  Once,   R        03/22/23 1813   03/22/23 1529  Blood Cultures x 2 sites  BLOOD CULTURE X 2,   STAT      03/22/23 1529             Vitals/Pain Today's Vitals   03/22/23 1522 03/22/23 1524 03/22/23 1600 03/22/23 1615  BP: (!) 124/56  137/78 (!) 141/76  Pulse: 84  74 81  Resp: 18  15   Temp: 98.1 F (36.7 C)  98.4 F (36.9 C)   TempSrc: Oral     SpO2: 95%  99% 98%  Weight:  61.2 kg    Height:  5\' 3"  (1.6 m)    PainSc:  10-Worst pain ever      Isolation Precautions No active isolations  Medications Medications  enoxaparin (LOVENOX) injection 40 mg (has no administration in time range)  albuterol (VENTOLIN HFA) 108 (90 Base) MCG/ACT inhaler 2 puff (has no administration in time range)  carvedilol (COREG) tablet 6.25 mg (has no administration in time range)  fluticasone (FLONASE) 50 MCG/ACT nasal spray 1 spray (has no administration in time range)  Senna CAPS 2 tablet (has no administration in time range)  methadone (DOLOPHINE) 10 MG/5ML solution 115 mg (has no administration in time range)  Centrum Women TABS 1 tablet (has no administration in time range)  atorvastatin (LIPITOR) tablet 80 mg (has no administration in time range)  loratadine (CLARITIN) tablet 10 mg (has no administration in time range)  DULoxetine (CYMBALTA) DR capsule 60 mg (has no administration in time range)    Mobility non-ambulatory     Focused Assessments    R Recommendations: See Admitting Provider Note  Report given to:   Additional Notes:

## 2023-03-22 NOTE — ED Provider Notes (Signed)
[facility] Provider Note  HPI   Rose [name] is a 67 y.o. female patient with a PMHx of diabetes, peripheral neuropathy, CAD status post PCI, PAD status post PCI here today with concern for right big toe ulcer.  Patient states that for the past 2 weeks after stubbing her toe on a curb, she has a developing right ulcer on the plantar aspect of her right big toe.  She is also had some scattered back pain, although she states this is normal for her.  No fevers chills no IV drug use recently although she is on methadone for previous IV drug use over 10 years ago   ROS Negative except as per HPI   Medical Decision Making   Upon presentation, the patient is afebrile HDS Patient is a very significant deep ulcer to the right foot plantar aspect of the right big toe, this is very tender in this area there is no surrounding warmth but there is some surrounding edema no surrounding redness.  Has DP and PT pulses as well, similar to the left side as well.   Right leg does have some trace redness compared to left which she states is new, although right leg is slightly swollen compared to left (which she states is old)   For this patient, prior to me seeing her, she had had a leukocytosis of 12.4, hemoglobin 9.7 which is slightly down from prior.  Creatinine 1.86, which may be an AKI as well.  Potassium slightly low.  Will push oral fluids, and replete the potassium as well.  X-rays compatible with osteomyelitis bone destruction in the affected joint.  The redness on the right upper calf, is consistent with spreading infection, do not think this is necrotizing infection, I think this is just related to the osteomyelitis that is happening.  Will start Zosyn for coverage for pseudomonal coverage as well, and will admit to the medicine service for further care.  Will defer antibiotic coverage for inpatient team to decide if they want to hold this, as they  may want to obtain cultures of the wound.  Patient was admitted to the teaching medicine service at this time   No diagnosis found.  @DISPOSITION @  Rx / DC Orders ED Discharge Orders     None        Past Medical History:  Diagnosis Date   ACE inhibitor-aggravated angioedema, initial encounter 04/26/2016   Allergic rhinitis    Anxiety    Arthritis    Calculus of gallbladder without cholecystitis without obstruction 10/28/2018   Chronic back pain    CKD (chronic kidney disease), stage III (HCC)    folllowed by pcp   Coronary artery disease    Depression    Diabetic neuropathy (HCC)    Edema of lower extremity 10/28/2018   Encounter for completion of form with patient 06/21/2019   Environmental allergies    Environmental allergies 12/28/2015   Fibromyalgia    Foot ulcer secondary to diabetes 06/21/2019   Frequency of urination    History of chronic bronchitis    History of drug abuse (HCC) states quit herion in 2006   History of hepatitis C last abd ultrasound in epic 11-10-2018;  last hepative panel in epic 11-30-2018 was normal   previously followed by [facility] (notes in care everywhere)-- dx 04/ 2015,  started harvonic treatment 07/ 2015   History of rib fracture 10/28/2018   HPV in female 06/11/2016  Hyperlipidemia associated with type 2 diabetes mellitus (HCC) 12/13/2015   Hypertension    followed by pcp  (nuclear stress test 01-15-2017 in epic,  showed low risk normal w/ nuclear ef 55%)   Laceration of right foot 03/04/2019   Lipoma of neck 07/16/2017   Seen by general surgery Dr Carolynne Edouard May 16 - plan for removal of 2cm sebaceous cyst.    Methadone maintenance therapy patient (HCC)    Mild asthma    followed by pcp   Nocturia    PAD (peripheral artery disease) (HCC)    followed by cardiology, dr Kirke Corin---  06-01-2019  s/p bilateral CIA angioplasty stenting   Recurrent falls 04/30/2016   Right foot ulcer (HCC)    Symptomatic anemia  11/15/2020   Tendonitis    bilateral wrist   Type 2 diabetes mellitus (HCC)    followed by pcp   (06-16-2019  per pt checks blood sugar every other day in am,  fasting sugar 92-95)   Urge urinary incontinence    Past Surgical History:  Procedure Laterality Date   ABDOMINAL AORTOGRAM W/LOWER EXTREMITY Bilateral 06/01/2019   Procedure: ABDOMINAL AORTOGRAM W/LOWER EXTREMITY;  Surgeon: [name], MD;  Location: [facility];  Service: Cardiovascular;  Laterality: Bilateral;   ABDOMINAL AORTOGRAM W/LOWER EXTREMITY N/A 06/04/2022   Procedure: ABDOMINAL AORTOGRAM W/LOWER EXTREMITY;  Surgeon: [name], MD;  Location: [facility];  Service: Cardiovascular;  Laterality: N/A;   APPENDECTOMY  1972   BREAST BIOPSY Right 09/24/2022   MM RT BREAST BX W LOC DEV 1ST LESION IMAGE BX SPEC STEREO GUIDE 09/24/2022 [facility] MAMMOGRAPHY   INTERSTIM IMPLANT PLACEMENT  10/23/2011   Procedure: Leane Platt IMPLANT FIRST STAGE;  Surgeon: Martina Sinner, MD;  Location: [facility];  Service: Urology;  Laterality: N/A;   INTERSTIM IMPLANT PLACEMENT  10/23/2011   Procedure: Leane Platt IMPLANT SECOND STAGE;  Surgeon: Martina Sinner, MD;  Location: [facility];  Service: Urology;  Laterality: N/A;   LEFT HEART CATH AND CORONARY ANGIOGRAPHY N/A 01/11/2020   Procedure: LEFT HEART CATH AND CORONARY ANGIOGRAPHY;  Surgeon: [name], MD;  [facility]: [facility];  Service: Cardiovascular;  Laterality: N/A;   PANACOS TISSUE GRAFT Right 06/24/2019   Procedure: Application of Skin Graft Substitute;  Surgeon: Park Liter, DPM;  Location: [facility] [place];  Service: Podiatry;  Laterality: Right;   PERIPHERAL VASCULAR INTERVENTION Bilateral 06/01/2019   Procedure: PERIPHERAL VASCULAR INTERVENTION;  Surgeon: [name], MD;  [facility]: [facility];  Service: Cardiovascular;  Laterality: Bilateral;   WOUND DEBRIDEMENT Right 06/24/2019    Procedure: DEBRIDEMENT WOUND;  Surgeon: Park Liter, DPM;  Location: [facility] [place];  Service: Podiatry;  Laterality: Right;   WOUND DEBRIDEMENT Right 08/24/2019   Procedure: DEBRIDEMENT RIGHT FOOT WOUND; APPLICATION OF SKIN GRAFT SUBSTITUTE;  Surgeon: Park Liter, DPM;  Location: [facility];  Service: Podiatry;  Laterality: Right;   Family History  Problem Relation Age of Onset   Hypertension Mother    Diabetes type II Mother    Heart attack Mother 27   CAD Father    Heart attack Father 75   Hypertension Brother    Diabetes type II Brother    Heart attack Brother 68   Diabetes type II Sister    Social History   Socioeconomic History   Marital status: Single    Spouse name: Not on file   Number of children:  Not on file   Years of education: Not on file   Highest education level: Not on file  Occupational History   Not on file  Tobacco Use   Smoking status: Some Days    Current packs/day: 0.50    Average packs/day: 0.5 packs/day for 20.0 years (10.0 ttl pk-yrs)    Types: Cigarettes   Smokeless tobacco: Never   Tobacco comments:    SMOKES 4 CIGS PER DAY  Vaping Use   Vaping status: Never Used  Substance and Sexual Activity   Alcohol use: No    Alcohol/week: 0.0 standard drinks of alcohol   Drug use: Not Currently    Comment: hx heroin use, IV,  last used 2006 (currently on methadone)   Sexual activity: Not Currently  Other Topics Concern   Not on file  Social History Narrative   Not on file   Social Drivers of Health   Financial Resource Strain: Low Risk  (09/23/2022)   Overall Financial Resource Strain (CARDIA)    Difficulty of Paying Living Expenses: Not hard at all  Food Insecurity: No Food Insecurity (09/23/2022)   Hunger Vital Sign    Worried About Running Out of Food in the Last Year: Never true    Ran Out of Food in the Last Year: Never true  Transportation Needs: No Transportation Needs (09/23/2022)   PRAPARE -  Administrator, Civil Service (Medical): No    Lack of Transportation (Non-Medical): No  Physical Activity: Insufficiently Active (09/23/2022)   Exercise Vital Sign    Days of Exercise per Week: 2 days    Minutes of Exercise per Session: 20 min  Stress: No Stress Concern Present (09/23/2022)   [employer] of Occupational Health - Occupational Stress Questionnaire    Feeling of Stress : Not at all  Social Connections: Moderately Isolated (09/23/2022)   Social Connection and Isolation Panel [NHANES]    Frequency of Communication with Friends and Family: Three times a week    Frequency of Social Gatherings with Friends and Family: Twice a week    Attends Religious Services: More than 4 times per year    Active Member of Clubs or Organizations: No    Attends Banker Meetings: Never    Marital Status: Never married  Intimate Partner Violence: Not At Risk (09/23/2022)   Humiliation, Afraid, Rape, and Kick questionnaire    Fear of Current or Ex-Partner: No    Emotionally Abused: No    Physically Abused: No    Sexually Abused: No     Physical Exam   Vitals:   03/22/23 1522 03/22/23 1524 03/22/23 1600 03/22/23 1615  BP: (!) 124/56  137/78 (!) 141/76  Pulse: 84  74 81  Resp: 18  15   Temp: 98.1 F (36.7 C)  98.4 F (36.9 C)   TempSrc: Oral     SpO2: 95%  99% 98%  Weight:  61.2 kg    Height:  5\' 3"  (1.6 m)      Physical Exam Vitals and nursing note reviewed.  Constitutional:      General: She is not in acute distress.    Appearance: She is well-developed.  HENT:     Head: Normocephalic and atraumatic.  Eyes:     Conjunctiva/sclera: Conjunctivae normal.  Cardiovascular:     Rate and Rhythm: Normal rate and regular rhythm.     Heart sounds: No murmur heard. Pulmonary:     Effort: Pulmonary effort is normal. No respiratory distress.  Breath sounds: Normal breath sounds.  Abdominal:     Palpations: Abdomen is soft.     Tenderness: There is no  abdominal tenderness.  Musculoskeletal:        General: No swelling.     Cervical back: Neck supple.     Comments: Right leg does have some trace redness compared to left which she states is new, although right leg is slightly swollen compared to left (which she states is old)  Skin:    General: Skin is warm and dry.     Capillary Refill: Capillary refill takes less than 2 seconds.     Comments: Patient is a very significant deep ulcer to the right foot plantar aspect of the right big toe, this is very tender in this area there is no surrounding warmth but there is some surrounding edema no surrounding redness.  Has DP and PT pulses as well, similar to the left side as well.  Neurological:     General: No focal deficit present.     Mental Status: She is alert and oriented to person, place, and time.  Psychiatric:        Mood and Affect: Mood normal.        Procedures   If procedures were preformed on this patient, they are listed below:  Procedures  The patient was seen, evaluated, and treated in conjunction with the attending physician, who voiced agreement in the care provided.  Note generated using Dragon voice dictation software and may contain dictation errors. Please contact me for any clarification or with any questions.   Electronically signed by:  Osvaldo Shipper, M.D. (PGY-2)    Gunnar Bulla, MD 03/22/23 [id number]    Rolan Bucco, MD 03/22/23 [phone]

## 2023-03-22 NOTE — Hospital Course (Addendum)
  2 weeks hit toe, 1 week noticed infection, denied f/c. No recent IV drug use   Past medical hx;  Meds  All except: vallium    Methadone last dose this AM, takes daily 115  Syndardy for DM XR 12-998 MG TB24   Social self No working Support? Oldest daughter Indeopdendsnt adls idls  Smokes 10 cigs a day, cut down from one ppd 25 years  EtOH no Prior IV drugs

## 2023-03-22 NOTE — Plan of Care (Signed)

## 2023-03-22 NOTE — Progress Notes (Signed)
ED Pharmacy Antibiotic Sign Off An antibiotic consult was received from an ED provider for zosyn per pharmacy dosing for  wound infection . A chart review was completed to assess appropriateness.  The following one time order(s) were placed per pharmacy consult:  zosyn 3.375 g mg x 1 dose  Further antibiotic and/or antibiotic pharmacy consults should be ordered by the admitting provider if indicated.   Thank you for allowing pharmacy to be a part of this patient's care.   Delmar Landau, PharmD, BCPS 03/22/2023 5:31 PM ED Clinical Pharmacist -  [phone]

## 2023-03-22 NOTE — ED Provider Triage Note (Signed)
Emergency Medicine Provider Triage Evaluation Note  Rose [name] , a 67 y.o. female  was evaluated in triage.  Pt complains of toe pain. Report she stumped her R great toe on a curb 2 weeks ago.  Endorse worsening pain, chills, nausea since.  Hx of DM  Review of Systems  Positive: As above Negative: As above  Physical Exam  BP (!) 124/56   Pulse 84   Temp 98.1 F (36.7 C) (Oral)   Resp 18   Ht 5\' 3"  (1.6 m)   Wt 61.2 kg   LMP 07/31/2011   SpO2 95%   BMI 23.91 kg/m  Gen:   Awake, no distress   Resp:  Normal effort  MSK:   Moves extremities without difficulty  Other:    Medical Decision Making  Medically screening exam initiated at 3:27 PM.  Appropriate orders placed.  Rose [name] was informed that the remainder of the evaluation will be completed by another provider, this initial triage assessment does not replace that evaluation, and the importance of remaining in the ED until their evaluation is complete.  Diabetic foot ulcer   Fayrene Helper, PA-C 03/22/23 1530

## 2023-03-22 NOTE — H&P (Cosign Needed)
Date: 03/22/2023               Patient Name:  Rose [name] MRN: [id number]  DOB: [birth year] Age / Sex: 67 y.o., female   PCP: [name], MD         Medical Service: Internal Medicine Teaching Service         Attending Physician: Dr. Gust Rung, DO      First Contact: Dr. [name], DO Pager [phone]    Second Contact: Dr. Marrianne Mood, MD Pager [phone]         After Hours (After 5p/  First Contact Pager: [phone]  weekends / holidays): Second Contact Pager: [phone]   SUBJECTIVE   Chief Complaint: Right toe wound  History of Present Illness:  Rose [name] is a 67 year old female with a history of peripheral arterial disease, type 2 diabetes mellitus, hypertension, methadone maintenance therapy who is presenting for a great right toe wound after she stubbed her right toe on a curb 2 weeks ago.  Patient reported stubbing her great right toe on a curb 2 weeks ago which caused a wound on her right toe.  She stated that she did not notice that her toe looked infected until about 1 week ago.  Patient, her great right toe is not causing her much pain.  She is also unaware that she had peripheral arterial disease.  She denies systemic symptoms such as fever, chills, shortness of breath, chest pain.  She denies recent IV drug use.   Review of Systems: A complete ROS was negative except as per HPI.   Past Medical History: PAD T2DM Methadone maintenance therapy Hypertension Tobacco use  Meds:  Albuterol 2 puffs every 6 hours Lipitor 80 mg daily Carvedilol 6.25 mg twice daily Zyrtec 10 mg Plavix 75 mg Cymbalta 60 mg daily Methadone 115 mg daily Synjardy XR 12-998 mg daily  Allergies: Allergies as of 03/22/2023 - Review Complete 03/22/2023  Allergen Reaction Noted   Lisinopril Swelling 04/26/2016   Gabapentin Hives and Other (See Comments) 03/24/2011   Vicodin [hydrocodone-acetaminophen] Hives, Diarrhea, and Other (See Comments)  03/24/2011   Hydrocodone-acetaminophen  11/15/2020     Past Surgical History:  Procedure Laterality Date   ABDOMINAL AORTOGRAM W/LOWER EXTREMITY Bilateral 06/01/2019   Procedure: ABDOMINAL AORTOGRAM W/LOWER EXTREMITY;  Surgeon: [name], MD;  [facility];  Service: Cardiovascular;  Laterality: Bilateral;   ABDOMINAL AORTOGRAM W/LOWER EXTREMITY N/A 06/04/2022   Procedure: ABDOMINAL AORTOGRAM W/LOWER EXTREMITY;  Surgeon: [name], MD;  [facility];  Service: Cardiovascular;  Laterality: N/A;   APPENDECTOMY  1972   BREAST BIOPSY Right 09/24/2022   MM RT BREAST BX W LOC DEV 1ST LESION IMAGE BX SPEC STEREO GUIDE 09/24/2022 [facility] MAMMOGRAPHY   INTERSTIM IMPLANT PLACEMENT  10/23/2011   Procedure: Leane Platt IMPLANT FIRST STAGE;  Surgeon: Martina Sinner, MD;  Location: [facility];  Service: Urology;  Laterality: N/A;   INTERSTIM IMPLANT PLACEMENT  10/23/2011   Procedure: Leane Platt IMPLANT SECOND STAGE;  Surgeon: Martina Sinner, MD;  Location: [facility];  Service: Urology;  Laterality: N/A;   LEFT HEART CATH AND CORONARY ANGIOGRAPHY N/A 01/11/2020   Procedure: LEFT HEART CATH AND CORONARY ANGIOGRAPHY;  Surgeon: [name], MD;  [facility];  Service: Cardiovascular;  Laterality: N/A;   PANACOS TISSUE GRAFT Right 06/24/2019   Procedure: Application of Skin Graft Substitute;  Surgeon: Park Liter, DPM;  Location: [facility];  Service: Podiatry;  Laterality: Right;   PERIPHERAL VASCULAR INTERVENTION Bilateral 06/01/2019   Procedure: PERIPHERAL VASCULAR INTERVENTION;  Surgeon: [name], MD;  [facility];  Service: Cardiovascular;  Laterality: Bilateral;   WOUND DEBRIDEMENT Right 06/24/2019   Procedure: DEBRIDEMENT WOUND;  Surgeon: Park Liter, DPM;  Location: [facility] [place];  Service: Podiatry;  Laterality: Right;   WOUND DEBRIDEMENT  Right 08/24/2019   Procedure: DEBRIDEMENT RIGHT FOOT WOUND; APPLICATION OF SKIN GRAFT SUBSTITUTE;  Surgeon: Park Liter, DPM;  Location: [facility] [place];  Service: Podiatry;  Laterality: Right;    Social:  Lives With: Herself Occupation: Not currently employed Support: Family support with daughter Level of Function: Independent ADLs and IADLs PCP: Dr. Mickie Bail Substances: Patient reports 25-pack-year history of tobacco, denies current alcohol use, prior history of IV drug use  Family History: N/A  OBJECTIVE:   Physical Exam: Blood pressure (!) 141/76, pulse 81, temperature 98.4 F (36.9 C), resp. rate 15, height 5\' 3"  (1.6 m), weight 61.2 kg, last menstrual period 07/31/2011, SpO2 98%.  Constitutional: well-appearing, sitting in bed, in no acute distress HENT: normocephalic atraumatic Cardiovascular: regular rate and rhythm, no m/r/g Pulmonary/Chest: normal work of breathing on room air, lungs clear to auscultation bilaterally.  Abdominal: soft, non-tender, non-distended.  Neurological: alert & oriented x 3 MSK: Right great toe ulceration, right lower extremity cellulitis with erythema present, DP pulses are diminished bilaterally skin: warm and dry Psych: Normal mood and affect   Labs: CBC    Latest Ref Rng & Units 03/22/2023    3:29 PM 05/20/2022   11:13 AM 12/26/2020    2:50 PM  CBC  WBC 4.0 - 10.5 K/uL 12.4  8.7  10.1   Hemoglobin 12.0 - 15.0 g/dL 9.7  16.1  09.6   Hematocrit 36.0 - 46.0 % 32.3  35.4  32.4   Platelets 150 - 400 K/uL 326  278  234.0        CMP     Latest Ref Rng & Units 03/22/2023    3:29 PM 05/20/2022   11:13 AM 12/10/2021   11:15 AM  CMP  Glucose 70 - 99 mg/dL 045  409  811   BUN 8 - 23 mg/dL 19  17  20    Creatinine 0.44 - 1.00 mg/dL 9.14  7.82  9.56   Sodium 135 - 145 mmol/L 136  139  137   Potassium 3.5 - 5.1 mmol/L 3.3  3.7  3.9   Chloride 98 - 111 mmol/L 101  96  94   CO2 22 - 32 mmol/L 24  28  27    Calcium 8.9 - 10.3 mg/dL  8.9  9.7  9.6   Total Protein 6.5 - 8.1 g/dL 7.5     Total Bilirubin <1.2 mg/dL 0.5     Alkaline Phos 38 - 126 U/L 63     AST 15 - 41 U/L 15     ALT 0 - 44 U/L 10         Imaging: DG Foot Complete Right Result Date: 03/22/2023 CLINICAL DATA:  Right first toe ulcer evaluation. EXAM: RIGHT FOOT COMPLETE - 3+ VIEW COMPARISON:  01/06/2020 FINDINGS: Evidence of patient's soft tissue ulceration over the distal first toe. There is moderate underlying bone destruction involving the head of the first distal phalanx compatible with osteomyelitis. Remaining bony structures and joint spaces are unremarkable. IMPRESSION: Soft tissue ulceration over the distal first toe with  moderate underlying bone destruction involving the head of the first distal phalanx compatible with osteomyelitis. Electronically Signed   By: Elberta Fortis M.D.   On: 03/22/2023 17:06      EKG: personally reviewed my interpretation is normal sinus rhythm without significant ST segment changes.   ASSESSMENT & PLAN:   Assessment & Plan by Problem: Principal Problem:   Osteomyelitis of great toe of right foot (HCC) Active Problems:   Type 2 diabetes mellitus with complication, without long-term current use of insulin (HCC)   Essential hypertension   Methadone maintenance therapy patient (HCC)   Depression   PAD s/p PCI with stent   CAD s/p DES to LAD (12/2019)   Wound of right foot   Rose [name] is a 67 y.o. person living with a history of peripheral arterial disease, type 2 diabetes mellitus, hypertension, methadone maintenance therapy who presented with right great toe wound and admitted for osteomyelitis on hospital day 0  #Osteomyelitis of Right Great Toe  #Cellulitis of Right Lower Extremity  Patient presented with a diabetic ulcer with osteomyelitis of the right great toe after an injury 2 weeks ago.  On physical exam she also has concurrent cellulitis that is extended to the level of the right knee.   Patient's osteomyelitis is most likely due to diagnosis of PAD as well as type 2 diabetes mellitus.  Podiatry has already been consulted, patient will likely need surgical interventions at this time.  Patient does not appear to be septic at this time, she is not exhibiting any systemic symptoms or complaints.  She is well-appearing overall. Plan: -Will hold off on antibiotics until surgery for culture, patient appears well at the moment  -Podiatry is already consulted, patient is n.p.o. at midnight -Blood cultures have already been collected -Currently holding Plavix for PAD -PT/OT consulted -Patient will need tobacco use counseling during hospitalization -MRI of right great toe ordered  #Essential HTN Patient is on a regimen of carvedilol 6.25 mg twice daily.  Will continue home regimen  #PAD s/p PCI Stent Continued home Lipitor 80 mg Will hold Plavix 75 mg in anticipation of possible podiatry interventions tomorrow -ABIs ordered  # Tobacco use disorder -Will begin nicotine 14 mg patches  #CAD s/p DES Continued home Lipitor 80 mg  #T2DM CBG in the morning, will start SSI if necessary  #Methadone Maintenance Therapy  Restarted patient's home methadone of 150 mg, patient is seen by pain clinic denies recent IV drug use  #Depression Continued home Cymbalta 60 mg  Diet: Carb modified, n.p.o. at midnight VTE: Enoxaparin Code: Full  Prior to Admission Living Arrangement: Home Anticipated Discharge Location: Home Barriers to Discharge: Medical treatment of right great toe osteomyelitis  Dispo: Admit patient to Inpatient with expected length of stay greater than 2 midnights.  Signed:   [name], DO Internal Medicine Resident PGY-1 03/22/2023, 6:50 PM   Please contact the on call pager after 5 pm and on weekends at [phone].

## 2023-03-23 ENCOUNTER — Inpatient Hospital Stay (HOSPITAL_COMMUNITY): Payer: 59

## 2023-03-23 ENCOUNTER — Encounter (HOSPITAL_COMMUNITY)
Admission: EM | Disposition: A | Discharge status: Home Health | Payer: Self-pay | Source: Home / Self Care | Attending: Internal Medicine

## 2023-03-23 ENCOUNTER — Other Ambulatory Visit: Payer: Self-pay

## 2023-03-23 ENCOUNTER — Encounter (HOSPITAL_COMMUNITY): Payer: Self-pay | Admitting: Internal Medicine

## 2023-03-23 DIAGNOSIS — E11622 Type 2 diabetes mellitus with other skin ulcer: Secondary | ICD-10-CM

## 2023-03-23 DIAGNOSIS — E1151 Type 2 diabetes mellitus with diabetic peripheral angiopathy without gangrene: Secondary | ICD-10-CM

## 2023-03-23 DIAGNOSIS — E1122 Type 2 diabetes mellitus with diabetic chronic kidney disease: Secondary | ICD-10-CM

## 2023-03-23 DIAGNOSIS — N1831 Chronic kidney disease, stage 3a: Secondary | ICD-10-CM

## 2023-03-23 DIAGNOSIS — N179 Acute kidney failure, unspecified: Secondary | ICD-10-CM | POA: Insufficient documentation

## 2023-03-23 DIAGNOSIS — F1120 Opioid dependence, uncomplicated: Secondary | ICD-10-CM

## 2023-03-23 DIAGNOSIS — E1169 Type 2 diabetes mellitus with other specified complication: Secondary | ICD-10-CM | POA: Diagnosis not present

## 2023-03-23 DIAGNOSIS — L97519 Non-pressure chronic ulcer of other part of right foot with unspecified severity: Secondary | ICD-10-CM | POA: Diagnosis not present

## 2023-03-23 DIAGNOSIS — M869 Osteomyelitis, unspecified: Secondary | ICD-10-CM | POA: Diagnosis not present

## 2023-03-23 DIAGNOSIS — I129 Hypertensive chronic kidney disease with stage 1 through stage 4 chronic kidney disease, or unspecified chronic kidney disease: Secondary | ICD-10-CM

## 2023-03-23 HISTORY — PX: AMPUTATION TOE: SHX6595

## 2023-03-23 LAB — BASIC METABOLIC PANEL
Anion gap: 8 (ref 5–15)
BUN: 20 mg/dL (ref 8–23)
CO2: 24 mmol/L (ref 22–32)
Calcium: 8.8 mg/dL — ABNORMAL LOW (ref 8.9–10.3)
Chloride: 105 mmol/L (ref 98–111)
Creatinine, Ser: 1.98 mg/dL — ABNORMAL HIGH (ref 0.44–1.00)
GFR, Estimated: 28 mL/min — ABNORMAL LOW (ref >60)
Glucose, Bld: 132 mg/dL — ABNORMAL HIGH (ref 70–99)
Potassium: 3.6 mmol/L (ref 3.5–5.1)
Sodium: 137 mmol/L (ref 135–145)

## 2023-03-23 LAB — CBC
HCT: 30.1 % — ABNORMAL LOW (ref 36.0–46.0)
Hemoglobin: 9.2 g/dL — ABNORMAL LOW (ref 12.0–15.0)
MCH: 23.9 pg — ABNORMAL LOW (ref 26.0–34.0)
MCHC: 30.6 g/dL (ref 30.0–36.0)
MCV: 78.2 fL — ABNORMAL LOW (ref 80.0–100.0)
Platelets: 291 10*3/uL (ref 150–400)
RBC: 3.85 MIL/uL — ABNORMAL LOW (ref 3.87–5.11)
RDW: 14.4 % (ref 11.5–15.5)
WBC: 9.3 10*3/uL (ref 4.0–10.5)
nRBC: 0 % (ref 0.0–0.2)

## 2023-03-23 LAB — GLUCOSE, CAPILLARY
Glucose-Capillary: 139 mg/dL — ABNORMAL HIGH (ref 70–99)
Glucose-Capillary: 119 mg/dL — ABNORMAL HIGH (ref 70–99)
Glucose-Capillary: 86 mg/dL (ref 70–99)

## 2023-03-23 LAB — MAGNESIUM: Magnesium: 2.3 mg/dL (ref 1.7–2.4)

## 2023-03-23 SURGERY — AMPUTATION, TOE
Anesthesia: Monitor Anesthesia Care | Site: Toe | Laterality: Right

## 2023-03-23 MED ORDER — FENTANYL CITRATE (PF) 100 MCG/2ML IJ SOLN: 25.0000 ug | INTRAMUSCULAR | Status: DC | PRN

## 2023-03-23 MED ORDER — CEFAZOLIN SODIUM-DEXTROSE 2-4 GM/100ML-% IV SOLN
2.0000 g | Freq: Two times a day (BID) | INTRAVENOUS | Status: DC
  Administered 2023-03-23 – 2023-03-24 (×2): 2 g via INTRAVENOUS
  Filled 2023-03-23 (×2): qty 100

## 2023-03-23 MED ORDER — METHADONE HCL 5 MG PO TABS
115.0000 mg | ORAL_TABLET | Freq: Every day | ORAL | Status: DC
  Administered 2023-03-23 – 2023-03-25 (×3): 115 mg via ORAL
  Filled 2023-03-23 (×3): qty 1

## 2023-03-23 MED ORDER — PROPOFOL 500 MG/50ML IV EMUL
INTRAVENOUS | Status: DC | PRN
  Administered 2023-03-23: 150 ug/kg/min via INTRAVENOUS
  Administered 2023-03-23: 60 mg via INTRAVENOUS

## 2023-03-23 MED ORDER — LACTATED RINGERS IV BOLUS
1000.0000 mL | Freq: Once | INTRAVENOUS | Status: AC
  Administered 2023-03-23: 1000 mL via INTRAVENOUS

## 2023-03-23 MED ORDER — SODIUM CHLORIDE 0.9 % IV SOLN: INTRAVENOUS | Status: DC | PRN

## 2023-03-23 MED ORDER — ORAL CARE MOUTH RINSE
15.0000 mL | Freq: Once | OROMUCOSAL | Status: AC
Start: 2023-03-23 — End: 2023-03-23

## 2023-03-23 MED ORDER — CHLORHEXIDINE GLUCONATE 0.12 % MT SOLN
15.0000 mL | Freq: Once | OROMUCOSAL | Status: AC
  Administered 2023-03-23: 15 mL via OROMUCOSAL
  Filled 2023-03-23: qty 15

## 2023-03-23 MED ORDER — BUPIVACAINE HCL (PF) 0.5 % IJ SOLN
INTRAMUSCULAR | Status: DC | PRN
  Administered 2023-03-23: 5 mL

## 2023-03-23 MED ORDER — LIDOCAINE HCL 1 % IJ SOLN
INTRAMUSCULAR | Status: AC
  Filled 2023-03-23: qty 20

## 2023-03-23 MED ORDER — 0.9 % SODIUM CHLORIDE (POUR BTL) OPTIME
TOPICAL | Status: DC | PRN
  Administered 2023-03-23: 1000 mL

## 2023-03-23 MED ORDER — LIDOCAINE HCL (PF) 1 % IJ SOLN
INTRAMUSCULAR | Status: DC | PRN
  Administered 2023-03-23: 5 mL

## 2023-03-23 MED ORDER — BUPIVACAINE HCL (PF) 0.5 % IJ SOLN
INTRAMUSCULAR | Status: AC
  Filled 2023-03-23: qty 30

## 2023-03-23 MED ORDER — OXYCODONE HCL 5 MG PO TABS
5.0000 mg | ORAL_TABLET | ORAL | Status: DC | PRN
  Administered 2023-03-24 – 2023-03-25 (×4): 5 mg via ORAL
  Filled 2023-03-23 (×4): qty 1

## 2023-03-23 MED ORDER — ACETAMINOPHEN 500 MG PO TABS
1000.0000 mg | ORAL_TABLET | Freq: Three times a day (TID) | ORAL | Status: DC | PRN
  Administered 2023-03-23 – 2023-03-24 (×4): 1000 mg via ORAL
  Filled 2023-03-23 (×4): qty 2

## 2023-03-23 SURGICAL SUPPLY — 34 items
BLADE OSC/SAGITTAL MD 5.5X18 (BLADE) ×2 IMPLANT
BLADE SURG 10 STRL SS (BLADE) ×2 IMPLANT
BLADE SURG 15 STRL LF DISP TIS (BLADE) ×4 IMPLANT
BNDG COHESIVE 3X5 TAN ST LF (GAUZE/BANDAGES/DRESSINGS) ×2 IMPLANT
BNDG ELASTIC 3INX 5YD STR LF (GAUZE/BANDAGES/DRESSINGS) ×2 IMPLANT
BNDG ELASTIC 4INX 5YD STR LF (GAUZE/BANDAGES/DRESSINGS) IMPLANT
BNDG ESMARK 4X9 LF (GAUZE/BANDAGES/DRESSINGS) ×2 IMPLANT
BNDG GAUZE DERMACEA FLUFF 4 (GAUZE/BANDAGES/DRESSINGS) IMPLANT
CHLORAPREP W/TINT 26 (MISCELLANEOUS) ×2 IMPLANT
DRSG XEROFORM 1X8 (GAUZE/BANDAGES/DRESSINGS) IMPLANT
ELECT REM PT RETURN 9FT ADLT (ELECTROSURGICAL) ×1
ELECTRODE REM PT RTRN 9FT ADLT (ELECTROSURGICAL) ×2 IMPLANT
GAUZE SPONGE 2X2 STRL 8-PLY (GAUZE/BANDAGES/DRESSINGS) ×4 IMPLANT
GAUZE SPONGE 4X4 12PLY STRL (GAUZE/BANDAGES/DRESSINGS) ×2 IMPLANT
GAUZE STRETCH 2X75IN STRL (MISCELLANEOUS) ×2 IMPLANT
GAUZE XEROFORM 1X8 LF (GAUZE/BANDAGES/DRESSINGS) ×2 IMPLANT
GLOVE BIO SURGEON STRL SZ7.5 (GLOVE) ×2 IMPLANT
GLOVE BIOGEL PI IND STRL 7.5 (GLOVE) ×2 IMPLANT
GOWN STRL REUS W/ TWL LRG LVL3 (GOWN DISPOSABLE) ×4 IMPLANT
KIT BASIN OR (CUSTOM PROCEDURE TRAY) ×2 IMPLANT
NDL BIOPSY JAMSHIDI 8X6 (NEEDLE) IMPLANT
NDL HYPO 25X1 1.5 SAFETY (NEEDLE) ×4 IMPLANT
NEEDLE BIOPSY JAMSHIDI 8X6 (NEEDLE) IMPLANT
NEEDLE HYPO 25X1 1.5 SAFETY (NEEDLE) ×2 IMPLANT
PACK ORTHO EXTREMITY (CUSTOM PROCEDURE TRAY) ×2 IMPLANT
PADDING CAST ABS COTTON 4X4 ST (CAST SUPPLIES) ×4 IMPLANT
SPIKE FLUID TRANSFER (MISCELLANEOUS) ×4 IMPLANT
STAPLER VISISTAT 35W (STAPLE) IMPLANT
STOCKINETTE 4X48 STRL (DRAPES) ×2 IMPLANT
SUT ETHILON 3 0 FSLX (SUTURE) ×2 IMPLANT
SUT PROLENE 4 0 PS 2 18 (SUTURE) ×4 IMPLANT
SYR CONTROL 10ML LL (SYRINGE) ×4 IMPLANT
UNDERPAD 30X36 HEAVY ABSORB (UNDERPADS AND DIAPERS) ×2 IMPLANT
WATER STERILE IRR 1000ML POUR (IV SOLUTION) ×2 IMPLANT

## 2023-03-23 NOTE — Progress Notes (Signed)
Orthopedic Tech Progress Note Patient Details:  Rose [name] Jan 31, 1956 [id number]  Ortho Devices Type of Ortho Device: Postop shoe/boot Ortho Device/Splint Location: rle Ortho Device/Splint Interventions: Ordered, Application, Adjustment   Post Interventions Patient Tolerated: Well  Sewell Pitner A Laylah Riga 03/23/2023, 6:00 PM

## 2023-03-23 NOTE — Progress Notes (Signed)
PHARMACY NOTE:  ANTIMICROBIAL RENAL DOSAGE ADJUSTMENT  Current antimicrobial regimen includes a mismatch between antimicrobial dosage and estimated renal function.  As per policy approved by the Pharmacy & Therapeutics and Medical Executive Committees, the antimicrobial dosage will be adjusted accordingly.  Current antimicrobial dosage:  Cefazolin 2g IV q8h  Indication: surgical prophylaxis  Renal Function:  Estimated Creatinine Clearance: 23.4 mL/min (A) (by C-G formula based on SCr of 1.98 mg/dL (H)). []      On intermittent HD, scheduled: []      On CRRT    Antimicrobial dosage has been changed to:  2g IV q8h  Additional comments:   Thank you for allowing pharmacy to be a part of this patient's care.  Loralee Pacas, PharmD, BCPS 03/23/2023 4:04 PM

## 2023-03-23 NOTE — Consult Note (Signed)
PODIATRY CONSULTATION  NAME Rose [name] MRN [id number] DOB 11/25/1955 DOA 03/22/2023   Reason for consult:  Chief Complaint  Patient presents with   Skin Ulcer   Back Pain    Attending/Consulting physician: Cleda Daub DO  History of present illness: "Rose [name] is a 67 year old female with a history of peripheral arterial disease, type 2 diabetes mellitus, hypertension, methadone maintenance therapy who is presenting for a great right toe wound after she stubbed her right toe on a curb 2 weeks ago.   Patient reported stubbing her great right toe on a curb 2 weeks ago which caused a wound on her right toe.  She stated that she did not notice that her toe looked infected until about 1 week ago.  Patient, her great right toe is not causing her much pain.  She is also unaware that she had peripheral arterial disease.  She denies systemic symptoms such as fever, chills, shortness of breath, chest pain.  She denies recent IV drug use."  Pt states the infection has been worsening in her great toe. We discussed findings on XR as well as clinically and lab work points to bone infection. She agrees to proceed with amputation of the toe.     Past Medical History:  Diagnosis Date   ACE inhibitor-aggravated angioedema, initial encounter 04/26/2016   Allergic rhinitis    Anxiety    Arthritis    Calculus of gallbladder without cholecystitis without obstruction 10/28/2018   Chronic back pain    CKD (chronic kidney disease), stage III (HCC)    folllowed by pcp   Coronary artery disease    Depression    Diabetic neuropathy (HCC)    Edema of lower extremity 10/28/2018   Encounter for completion of form with patient 06/21/2019   Environmental allergies    Environmental allergies 12/28/2015   Fibromyalgia    Foot ulcer secondary to diabetes 06/21/2019   Frequency of urination    History of chronic bronchitis    History of drug abuse (HCC) states quit herion in 2006    History of hepatitis C last abd ultrasound in epic 11-10-2018;  last hepative panel in epic 11-30-2018 was normal   previously followed by [facility] (notes in care everywhere)-- dx 04/ 2015,  started harvonic treatment 07/ 2015   History of rib fracture 10/28/2018   HPV in female 06/11/2016   Hyperlipidemia associated with type 2 diabetes mellitus (HCC) 12/13/2015   Hypertension    followed by pcp  (nuclear stress test 01-15-2017 in epic,  showed low risk normal w/ nuclear ef 55%)   Laceration of right foot 03/04/2019   Lipoma of neck 07/16/2017   Seen by general surgery Dr Carolynne Edouard May 16 - plan for removal of 2cm sebaceous cyst.    Methadone maintenance therapy patient (HCC)    Mild asthma    followed by pcp   Nocturia    PAD (peripheral artery disease) (HCC)    followed by cardiology, dr Kirke Corin---  06-01-2019  s/p bilateral CIA angioplasty stenting   Recurrent falls 04/30/2016   Right foot ulcer (HCC)    Symptomatic anemia 11/15/2020   Tendonitis    bilateral wrist   Type 2 diabetes mellitus (HCC)    followed by pcp   (06-16-2019  per pt checks blood sugar every other day in am,  fasting sugar 92-95)   Urge urinary incontinence        Latest Ref Rng & Units 03/23/2023  5:56 AM 03/22/2023    3:29 PM 05/20/2022   11:13 AM  CBC  WBC 4.0 - 10.5 K/uL 9.3  12.4  8.7   Hemoglobin 12.0 - 15.0 g/dL 9.2  9.7  16.0   Hematocrit 36.0 - 46.0 % 30.1  32.3  35.4   Platelets 150 - 400 K/uL 291  326  278        Latest Ref Rng & Units 03/23/2023    5:56 AM 03/22/2023    3:29 PM 05/20/2022   11:13 AM  BMP  Glucose 70 - 99 mg/dL 109  323  557   BUN 8 - 23 mg/dL 20  19  17    Creatinine 0.44 - 1.00 mg/dL 3.22  0.25  4.27   BUN/Creat Ratio 12 - 28   11   Sodium 135 - 145 mmol/L 137  136  139   Potassium 3.5 - 5.1 mmol/L 3.6  3.3  3.7   Chloride 98 - 111 mmol/L 105  101  96   CO2 22 - 32 mmol/L 24  24  28    Calcium 8.9 - 10.3 mg/dL 8.8  8.9  9.7       Physical  Exam: Lower Extremity Exam Vasc: R - PT palpable, DP palpable. Cap refill < 3 sec to digits  L - PT palpable, DP palpable. Cap refill <3 sec to digits  Derm: R - Ulcer at the plantar right hallux with malodor but no active drainage. Erythema and edema present.  L - Normal temp/texture/turgor with no open lesion or clinical signs of infection    MSK:  R - Long great toe with edema   L -  No gross deformities. Compartments soft, non-tender, compressible  Neuro: R - Gross sensation diminished. Gross motor function intact   L - Gross sensation diminished. Gross motor function intact    ASSESSMENT/PLAN OF CARE 67 y.o. female with PMHx significant for  history of peripheral arterial disease, type 2 diabetes mellitus, hypertension, methadone maintenance  with Right hallux acute osteomyelitis.   WBC: 9.3 ESR 70 XR R foot: moderate osseous destruction of the distal phalanx head- osteomyelitis  - NPO for OR today for R great toe amputation. Patient in agreement to proceed. - Hold abx until after amp completed then iv broad spectrum - Anticoagulation: hold pending OR - Wound care: non needed pre op - WB status: WBAT to Right foot in post op shoe - Will continue to follow   Thank you for the consult.  Please contact me directly with any questions or concerns.           Corinna Gab, DPM [facility]    [address] [place], [place] [address]                Office [phone]  Fax [phone]

## 2023-03-23 NOTE — Plan of Care (Signed)
  Problem: Education: Goal: Knowledge of General Education information will improve Description: Including pain rating scale, medication(s)/side effects and non-pharmacologic comfort measures Outcome: Progressing   Problem: Clinical Measurements: Goal: Ability to maintain clinical measurements within normal limits will improve Outcome: Progressing Goal: Will remain free from infection Outcome: Progressing Goal: Diagnostic test results will improve Outcome: Progressing Goal: Respiratory complications will improve Outcome: Progressing Goal: Cardiovascular complication will be avoided Outcome: Progressing   Problem: Activity: Goal: Risk for activity intolerance will decrease Outcome: Progressing   Problem: Nutrition: Goal: Adequate nutrition will be maintained Outcome: Progressing   Problem: Pain Management: Goal: General experience of comfort will improve Outcome: Progressing

## 2023-03-23 NOTE — Op Note (Signed)
Full Operative Report  Date of Operation: 2:44 PM, 03/23/2023   Patient: Rose [name] - 67 y.o. female  Surgeon: Pilar Plate, DPM   Assistant: None  Diagnosis: osteomyelitis of right great toe  Procedure:  1. Amputation of hallux at MPJ level, right foot    Anesthesia: Monitor Anesthesia Care  Elmer Picker, MD  Anesthesiologist: Elmer Picker, MD CRNA: Sandie Ano, CRNA   Estimated Blood Loss: 10 mL  Hemostasis: 1) Anatomical dissection, mechanical compression, electrocautery 2) No tourniquet was used  Implants: * No implants in log *  Materials: Prolene 3-0  Injectables: 1) Pre-operatively: 10 cc of 50:50 mixture 1%lidocaine plain and 0.5% marcaine plain 2) Post-operatively: None   Specimens: Pathology: Right great toe  Microbiology: deep tissue swab culture 1st MPJ right   Antibiotics: IV antibiotics given per schedule on the floor  Drains: None  Complications: Patient tolerated the procedure well without complication.   Operative findings: As below in detailed report  Indications for Procedure: [name] presents to Pilar Plate, DPM with a chief complaint of right hallux ulcer. Concern for underlying osteomyelitis. The patient has failed conservative treatments of various modalities. At this time the patient has elected to proceed with surgical correction. All alternatives, risks, and complications of the procedures were thoroughly explained to the patient. Patient exhibits appropriate understanding of all discussion points and informed consent was signed and obtained in the chart with no guarantees to surgical outcome given or implied.  Description of Procedure: Patient was brought to the operating room. Patient remained on their hospital bed in the supine position. A surgical timeout was performed and all members of the operating room, the procedure, and the surgical site were identified.  anesthesia occurred as per anesthesia record. Local anesthetic as previously described was then injected about the operative field in a local infiltrative block.  The operative lower extremity as noted above was then prepped and draped in the usual sterile manner. The following procedure then began.  Attention was directed to the 1st digit on the right foot. A full-thickness incision encompassing the entire digit was made using a #15 blade. Dissection was carried down to bone. The toe was secured with a towel clamp, further dissected in its entirety, and disarticulated at the MPJ and passed to the back table as a gross specimen. This was then labled and sent to pathology. The bone was noted to be soft and eroded, and consistent with osteomyelitis. All remaining necrotic and devitalized soft tissue structures were visualized and dissected away using sharp and dull dissection. Care was taken to protect all neurovascular structures throughout the dissection. All bleeders were cauterized as necessary. A deep tissue culture was obtained at this time. The area was then flushed with copious amounts of sterile saline. Then using the suture materials previously described, the site was closed in anatomic layers and the skin was well approximated under minimal tension.  The surgical site was then dressed with xeroform 4x4 kerlix ace wrap. The patient tolerated both the procedure and anesthesia well with vital signs stable throughout. The patient was transferred in good condition and all vital signs stable  from the OR to recovery under the discretion of anesthesia.  Condition: Vital signs stable, neurovascular status unchanged from preoperative   Surgical plan:  Expect clean margins. Good bleeding. WBAT in post op shoe to right foot. 7 days augmentin on DC. Clear for DC from my standpoint. F/u in clinic next week mon/Tuesday.  The patient  will be WBAT in a  post op shoe to the operative limb until further  instructed. The dressing is to remain clean, dry, and intact. Will continue to follow unless noted elsewhere.   Carlena Hurl, DPM [facility]

## 2023-03-23 NOTE — Progress Notes (Signed)
PT Cancellation Note  Patient Details Name: Rose [name] MRN: [id number] DOB: 12-22-1955   Cancelled Treatment:    Reason Eval/Treat Not Completed: Other (comment) Plan for 1st toe amputation today.  Lillia Pauls, PT, DPT Acute Rehabilitation Services Office [phone]    Norval Morton 03/23/2023, 12:42 PM

## 2023-03-23 NOTE — Progress Notes (Signed)
OT Cancellation Note  Patient Details Name: Rose [name] MRN: [id number] DOB: 03/14/1956   Cancelled Treatment:    Reason Eval/Treat Not Completed: Patient at procedure or test/ unavailable (OR today)  Mateo Flow 03/23/2023, 1:32 PM

## 2023-03-23 NOTE — Anesthesia Preprocedure Evaluation (Addendum)
Anesthesia Evaluation  Patient identified by MRN, date of birth, ID band Patient awake    Reviewed: Allergy & Precautions, NPO status , Patient's Chart, lab work & pertinent test results, reviewed documented beta blocker date and time   Airway Mallampati: II  TM Distance: >3 FB Neck ROM: Full    Dental  (+) Edentulous Upper, Chipped, Poor Dentition, Dental Advisory Given   Pulmonary asthma , Current Smoker and Patient abstained from smoking.   Pulmonary exam normal breath sounds clear to auscultation       Cardiovascular hypertension, Pt. on home beta blockers and Pt. on medications + CAD, + Cardiac Stents and + Peripheral Vascular Disease  Normal cardiovascular exam Rhythm:Regular Rate:Normal  TTE 2020  1. The left ventricle has normal systolic function, with an ejection  fraction of 55-60%. The cavity size was normal. Left ventricular diastolic  Doppler parameters are consistent with impaired relaxation.   2. The right ventricle has normal systolic function. The cavity was  normal.   3. The mitral valve is grossly normal. There is mild mitral annular  calcification present.   4. The tricuspid valve is grossly normal.   5. The aortic valve is tricuspid. No stenosis of the aortic valve.   6. The aorta is normal unless otherwise noted.   7. Normal LV systolic function; grade 1 diastolic dysfunction.   Stress Test 2021  The left ventricular ejection fraction is normal (55-65%).  Nuclear stress EF: 65%.  There was no ST segment deviation noted during stress.  No T wave inversion was noted during stress.  Findings consistent with prior myocardial infarction.  This is an intermediate risk study.     Neuro/Psych  Headaches PSYCHIATRIC DISORDERS Anxiety Depression       GI/Hepatic negative GI ROS,,,(+)     substance abuse (methadone)  , Hepatitis -, C  Endo/Other  diabetes, Type 2    Renal/GU Renal diseaseLab  Results      Component                Value               Date                      NA                       137                 03/23/2023                CL                       105                 03/23/2023                K                        3.6                 03/23/2023                CO2                      24                  03/23/2023  BUN                      20                  03/23/2023                CREATININE               1.98 (H)            03/23/2023                GFRNONAA                 28 (L)              03/23/2023                CALCIUM                  8.8 (L)             03/23/2023                PHOS                     3.0                 11/04/2014                ALBUMIN                  3.2 (L)             03/22/2023                GLUCOSE                  132 (H)             03/23/2023             negative genitourinary   Musculoskeletal  (+) Arthritis ,  Fibromyalgia -, narcotic dependent  Abdominal   Peds  Hematology  (+) Blood dyscrasia, anemia Lab Results      Component                Value               Date                      WBC                      9.3                 03/23/2023                HGB                      9.2 (L)             03/23/2023                HCT                      30.1 (L)            03/23/2023                MCV  78.2 (L)            03/23/2023                PLT                      291                 03/23/2023              Anesthesia Other Findings   Reproductive/Obstetrics                             Anesthesia Physical Anesthesia Plan  ASA: 3  Anesthesia Plan: MAC   Post-op Pain Management: Tylenol PO (pre-op)*   Induction: Intravenous  PONV Risk Score and Plan: 1 and Propofol infusion, Treatment may vary due to age or medical condition, Midazolam and Ondansetron  Airway Management Planned: Natural Airway  Additional Equipment:    Intra-op Plan:   Post-operative Plan:   Informed Consent: I have reviewed the patients History and Physical, chart, labs and discussed the procedure including the risks, benefits and alternatives for the proposed anesthesia with the patient or authorized representative who has indicated his/her understanding and acceptance.     Dental advisory given  Plan Discussed with: CRNA  Anesthesia Plan Comments:        Anesthesia Quick Evaluation

## 2023-03-23 NOTE — Progress Notes (Signed)
Subjective:  Rose [name] is a 67 year old female with a history of peripheral arterial disease, type 2 diabetes mellitus, hypertension, methadone maintenance therapy who is presenting for a great right toe wound after she stubbed her right toe on a curb 2 weeks ago and is admitted for osteomyelitis of the R great toe.   Patient reports lower back pain today and stated that her toe pain is not bothersome. She is feeling nauseated because she is due for her methadone. Patient also reports feeling hungry.   Objective:  Vital signs in last 24 hours: Vitals:   03/22/23 1845 03/22/23 1927 03/23/23 0403 03/23/23 0653  BP: (!) 152/124 124/64 (!) 127/42 (!) 143/64  Pulse: 87 76 73 75  Resp:  19  17  Temp:  98.6 F (37 C) 98.8 F (37.1 C) 98.1 F (36.7 C)  TempSrc:  Oral Oral Oral  SpO2: 92% 100% 97% 97%  Weight:      Height:       Physical Exam: General:NAD, sitting in bed  Cardiac:RRR, no murmurs auscultated  Pulmonary:clear to auscultate bilaterally  Abdominal:soft, non-tender  Neuro:alert, awake, moving all four extremities spontaneously  MSK:R great toe ulceration, venous stasis dermatitis on RLE? Skin:warm and dry  Psych:  normal mood and affect      Latest Ref Rng & Units 03/23/2023    5:56 AM 03/22/2023    3:29 PM 05/20/2022   11:13 AM  CBC  WBC 4.0 - 10.5 K/uL 9.3  12.4  8.7   Hemoglobin 12.0 - 15.0 g/dL 9.2  9.7  40.1   Hematocrit 36.0 - 46.0 % 30.1  32.3  35.4   Platelets 150 - 400 K/uL 291  326  278        Latest Ref Rng & Units 03/23/2023    5:56 AM 03/22/2023    3:29 PM 05/20/2022   11:13 AM  BMP  Glucose 70 - 99 mg/dL 027  253  664   BUN 8 - 23 mg/dL 20  19  17    Creatinine 0.44 - 1.00 mg/dL 4.03  4.74  2.59   BUN/Creat Ratio 12 - 28   11   Sodium 135 - 145 mmol/L 137  136  139   Potassium 3.5 - 5.1 mmol/L 3.6  3.3  3.7   Chloride 98 - 111 mmol/L 105  101  96   CO2 22 - 32 mmol/L 24  24  28    Calcium 8.9 - 10.3 mg/dL 8.8  8.9  9.7       Assessment/Plan:  Principal Problem:   Osteomyelitis of great toe of right foot (HCC) Active Problems:   Type 2 diabetes mellitus with complication, without long-term current use of insulin (HCC)   Essential hypertension   Methadone maintenance therapy patient (HCC)   Depression   PAD s/p PCI with stent   CAD s/p DES to LAD (12/2019)   Wound of right foot   AKI (acute kidney injury) (HCC)   CKD stage 3a, GFR 45-59 ml/min (HCC)  #Osteomyelitis of Right Great Toe  #Cellulitis of Right Lower Extremity vs Venous stasis dermatitis  Patient presented with a diabetic ulcer with osteomyelitis of the right great toe after an injury 2 weeks ago.  On physical exam she also has concurrent venous stasis dermatitis vs cellulitis  that is extended to the level of the right knee.  Patient's osteomyelitis is most likely due to diagnosis of PAD as well as type 2 diabetes mellitus.  Podiatry has already been consulted, with surgery scheduled for today.  Plan: -Will hold off on antibiotics until surgery for culture, patient appears well at the moment  -Podiatry is already consulted and planning surgical interventions today  -Currently holding Plavix for PAD -PT/OT consulted -Patient will need tobacco use counseling during hospitalization   #AKI vs Progressing CKD stage 3A Serum creatine is elevated at 1.98 (prior Scr was 1.51 in February). Will administer IVF and elevate for improvement   Chronic Conditions #Essential HTN Patient is on a regimen of carvedilol 6.25 mg twice daily.  Will continue home regimen   #PAD s/p PCI Stent Continued home Lipitor 80 mg Will hold Plavix 75 mg in anticipation of possible podiatry interventions tomorrow -ABIs ordered   # Tobacco use disorder -nicotine 14 mg patches   #CAD s/p DES Continued home Lipitor 80 mg   #T2DM CBG each morning, hold off on SSI.    #Methadone Maintenance Therapy  Restarted patient's home methadone of 150 mg, patient is seen by  pain clinic denies recent IV drug use   #Chronic lower back pain Added tylenol, will add oxycodone if pain is not controlled with tylenol and methadone   #Depression Continued home Cymbalta 60 mg   Resolved Problems:   __________________________________ Prior to Admission Living Arrangement:Home Anticipated Discharge Location:Home  Barriers to Discharge:medical treatment  Dispo: Anticipated discharge in approximately 1-2 day(s).   [name], DO 03/23/2023, 8:00 AM After 5pm on weekdays and 1pm on weekends: On Call pager [phone]

## 2023-03-23 NOTE — Plan of Care (Signed)
  Problem: Clinical Measurements: Goal: Will remain free from infection Outcome: Progressing   Problem: Nutrition: Goal: Adequate nutrition will be maintained Outcome: Progressing   

## 2023-03-23 NOTE — Transfer of Care (Signed)
Immediate Anesthesia Transfer of Care Note  Patient: Rose [name]  Procedure(s) Performed: AMPUTATION TOE (Right: Toe)  Patient Location: PACU  Anesthesia Type:MAC  Level of Consciousness: awake, alert , and oriented  Airway & Oxygen Therapy: Patient Spontanous Breathing  Post-op Assessment: Report given to RN and Post -op Vital signs reviewed and stable  Post vital signs: Reviewed and stable  Last Vitals:  Vitals Value Taken Time  BP 115/53 03/23/23 1516  Temp    Pulse 68 03/23/23 1519  Resp 8 03/23/23 1519  SpO2 99 % 03/23/23 1519  Vitals shown include unfiled device data.  Last Pain:  Vitals:   03/23/23 1211  TempSrc:   PainSc: 4          Complications: No notable events documented.

## 2023-03-24 ENCOUNTER — Encounter (HOSPITAL_COMMUNITY): Payer: Self-pay | Admitting: Podiatry

## 2023-03-24 DIAGNOSIS — E11622 Type 2 diabetes mellitus with other skin ulcer: Secondary | ICD-10-CM | POA: Diagnosis not present

## 2023-03-24 DIAGNOSIS — M869 Osteomyelitis, unspecified: Secondary | ICD-10-CM | POA: Diagnosis not present

## 2023-03-24 DIAGNOSIS — E1169 Type 2 diabetes mellitus with other specified complication: Secondary | ICD-10-CM | POA: Diagnosis not present

## 2023-03-24 DIAGNOSIS — L97519 Non-pressure chronic ulcer of other part of right foot with unspecified severity: Secondary | ICD-10-CM | POA: Diagnosis not present

## 2023-03-24 LAB — BASIC METABOLIC PANEL
Anion gap: 8 (ref 5–15)
BUN: 17 mg/dL (ref 8–23)
CO2: 25 mmol/L (ref 22–32)
Calcium: 8.7 mg/dL — ABNORMAL LOW (ref 8.9–10.3)
Chloride: 102 mmol/L (ref 98–111)
Creatinine, Ser: 1.2 mg/dL — ABNORMAL HIGH (ref 0.44–1.00)
GFR, Estimated: 50 mL/min — ABNORMAL LOW (ref >60)
Glucose, Bld: 149 mg/dL — ABNORMAL HIGH (ref 70–99)
Potassium: 3.5 mmol/L (ref 3.5–5.1)
Sodium: 135 mmol/L (ref 135–145)

## 2023-03-24 LAB — CBC
HCT: 30.7 % — ABNORMAL LOW (ref 36.0–46.0)
Hemoglobin: 9.4 g/dL — ABNORMAL LOW (ref 12.0–15.0)
MCH: 23.7 pg — ABNORMAL LOW (ref 26.0–34.0)
MCHC: 30.6 g/dL (ref 30.0–36.0)
MCV: 77.5 fL — ABNORMAL LOW (ref 80.0–100.0)
Platelets: 293 10*3/uL (ref 150–400)
RBC: 3.96 MIL/uL (ref 3.87–5.11)
RDW: 14.3 % (ref 11.5–15.5)
WBC: 9.6 10*3/uL (ref 4.0–10.5)
nRBC: 0 % (ref 0.0–0.2)

## 2023-03-24 LAB — VITAMIN B12: Vitamin B-12: 599 pg/mL (ref 180–914)

## 2023-03-24 MED ORDER — AMOXICILLIN-POT CLAVULANATE 875-125 MG PO TABS
1.0000 | ORAL_TABLET | Freq: Two times a day (BID) | ORAL | Status: DC
  Administered 2023-03-24 – 2023-03-25 (×2): 1 via ORAL
  Filled 2023-03-24 (×2): qty 1

## 2023-03-24 MED ORDER — DOXYCYCLINE HYCLATE 100 MG PO TABS
100.0000 mg | ORAL_TABLET | Freq: Two times a day (BID) | ORAL | Status: DC
  Administered 2023-03-24 – 2023-03-25 (×3): 100 mg via ORAL
  Filled 2023-03-24 (×3): qty 1

## 2023-03-24 MED ORDER — CEFAZOLIN SODIUM-DEXTROSE 2-4 GM/100ML-% IV SOLN: 2.0000 g | Freq: Three times a day (TID) | INTRAVENOUS | Status: DC

## 2023-03-24 MED ORDER — CLOPIDOGREL BISULFATE 75 MG PO TABS
75.0000 mg | ORAL_TABLET | Freq: Every day | ORAL | Status: DC
  Administered 2023-03-24 – 2023-03-25 (×2): 75 mg via ORAL
  Filled 2023-03-24 (×2): qty 1

## 2023-03-24 NOTE — Plan of Care (Signed)

## 2023-03-24 NOTE — Evaluation (Signed)
Occupational Therapy Evaluation Patient Details Name: Rose [name] MRN: [id number] DOB: 31-Jan-1956 Today's Date: 03/24/2023   History of Present Illness 67 yo female R toe wound admitted with osteomyelitis and cellulitis of R great toe. Pt s/p 12/24 R Amputation of hallux at MPJ level foot PMH PAD DM2 HTn methadone maintenance therapy tobacco use   Clinical Impression   Patient is s/p R MPJ foot amputation surgery resulting in functional limitations due to the deficits listed below (see OT problem list). Pt at baseline indep with all adls and lives alone. Pt at this time able to complete adls with RW used. Pt reports transportation concerns for d/c prior to 12/26 due to daughter working. Pt reports L ear discomfort and only completing 4 days of a 7 day medication provided 2 weeks ago.  Patient will benefit from skilled OT acutely to increase independence and safety with ADLS to allow discharge HHOT. Pt could benefit from RN staff to address R wound care needs.         If plan is discharge home, recommend the following: Assistance with cooking/housework;Assist for transportation    Functional Status Assessment  Patient has had a recent decline in their functional status and demonstrates the ability to make significant improvements in function in a reasonable and predictable amount of time.  Equipment Recommendations  Other (comment) (rollator)    Recommendations for Other Services       Precautions / Restrictions Precautions Precautions: Fall Required Braces or Orthoses: Other Brace Other Brace: post op shoe R LE Restrictions Weight Bearing Restrictions Per Provider Order: No RLE Weight Bearing Per Provider Order: Weight bearing as tolerated      Mobility Bed Mobility               General bed mobility comments: oob on arrival    Transfers Overall transfer level: Modified independent Equipment used: Rolling walker (2 wheels)               General  transfer comment: use of bil ue on chair arm rest      Balance Overall balance assessment: Mild deficits observed, not formally tested                                         ADL either performed or assessed with clinical judgement   ADL Overall ADL's : Needs assistance/impaired Eating/Feeding: Independent   Grooming: Modified independent;Standing Grooming Details (indicate cue type and reason): brushing teeth, washing face, using bil UE no support,             Lower Body Dressing: Modified independent Lower Body Dressing Details (indicate cue type and reason): don doff post op shoe. pt able to figure 4 cross in chair Toilet Transfer: Modified Independent;Rolling walker (2 wheels);Regular Toilet           Functional mobility during ADLs: Supervision/safety;Rolling walker (2 wheels) General ADL Comments: pt reports concern for transportation due to daughter working until 26th. MD made aware of patients request     Vision Baseline Vision/History: 1 Wears glasses Ability to See in Adequate Light: 0 Adequate Patient Visual Report: No change from baseline Vision Assessment?: No apparent visual deficits     Perception         Praxis         Pertinent Vitals/Pain Pain Assessment Pain Assessment: No/denies pain     Extremity/Trunk Assessment Upper Extremity  Assessment Upper Extremity Assessment: Overall WFL for tasks assessed   Lower Extremity Assessment Lower Extremity Assessment: Defer to PT evaluation   Cervical / Trunk Assessment Cervical / Trunk Assessment: Normal (does report back pain during session but says thats "normal")   Communication Communication Communication: No apparent difficulties   Cognition Arousal: Alert Behavior During Therapy: WFL for tasks assessed/performed Overall Cognitive Status: Within Functional Limits for tasks assessed                                 General Comments: does report some hearing  changes and pain in L ear with prior infection with medication drops 2 weeks ago.     General Comments  VSS on RA    Exercises     Shoulder Instructions      Home Living Family/patient expects to be discharged to:: Private residence Living Arrangements: Alone Available Help at Discharge: Family;Available PRN/intermittently Type of Home: Apartment Home Access: Elevator     Home Layout: One level     Bathroom Shower/Tub: Chief Strategy Officer: Standard     Home Equipment: Cane - single point   Additional Comments: Long hallway to get to room      Prior Functioning/Environment Prior Level of Function : Independent/Modified Independent               ADLs Comments: Does not drive. Daughter or neighbor picks up groceries and/or medications. Independent IADL's\        OT Problem List: Decreased strength;Decreased activity tolerance;Impaired balance (sitting and/or standing)      OT Treatment/Interventions: Self-care/ADL training;Therapeutic exercise;Energy conservation;DME and/or AE instruction;Patient/family education;Balance training    OT Goals(Current goals can be found in the care plan section) Acute Rehab OT Goals OT Goal Formulation: With patient Time For Goal Achievement: 04/07/23 Potential to Achieve Goals: Good  OT Frequency: Min 1X/week    Co-evaluation              AM-PAC OT "6 Clicks" Daily Activity     Outcome Measure Help from another person eating meals?: None Help from another person taking care of personal grooming?: None Help from another person toileting, which includes using toliet, bedpan, or urinal?: None Help from another person bathing (including washing, rinsing, drying)?: None Help from another person to put on and taking off regular upper body clothing?: None Help from another person to put on and taking off regular lower body clothing?: None 6 Click Score: 24   End of Session Equipment Utilized During  Treatment: Rolling walker (2 wheels) Nurse Communication: Mobility status;Precautions  Activity Tolerance: Patient tolerated treatment well Patient left: in chair;with call bell/phone within reach;with chair alarm set  OT Visit Diagnosis: Unsteadiness on feet (R26.81);Muscle weakness (generalized) (M62.81)                Time: 4098-1191 OT Time Calculation (min): 28 min Charges:  OT General Charges $OT Visit: 1 Visit OT Evaluation $OT Eval Moderate Complexity: 1 Mod   Brynn, OTR/L  Acute Rehabilitation Services Office: [phone] .   Mateo Flow 03/24/2023, 11:37 AM

## 2023-03-24 NOTE — Progress Notes (Signed)
    Durable Medical Equipment  (From admission, onward)           Start     Ordered   03/24/23 1426  For home use only DME 4 wheeled rolling walker with seat  Once       Question Answer Comment  Patient needs a walker to treat with the following condition Physical deconditioning   Patient needs a walker to treat with the following condition Debility      03/24/23 1426   03/24/23 1130  For home use only DME Walker rolling  Once       Question Answer Comment  Walker: With 5 Inch Wheels   Patient needs a walker to treat with the following condition Decreased ambulation status      03/24/23 1130

## 2023-03-24 NOTE — Discharge Summary (Signed)
Name: Rose [name] MRN: [id number] DOB: [birth year] 67 y.o. PCP: [name], MD  Date of Admission: 03/22/2023  3:09 PM Date of Discharge: 03/25/2023 8:57 AM Attending Physician: Dr. Ninetta Lights  Discharge Diagnosis: Principal Problem:   Osteomyelitis of great toe of right foot (HCC) Active Problems:   Type 2 diabetes mellitus with complication, without long-term current use of insulin (HCC)   Essential hypertension   Methadone maintenance therapy patient (HCC)   Depression   PAD s/p PCI with stent   CAD s/p DES to LAD (12/2019)   Wound of right foot   AKI (acute kidney injury) (HCC)   CKD stage 3a, GFR 45-59 ml/min (HCC)    Discharge Medications: Allergies as of 03/25/2023       Reactions   Lisinopril Swelling   Angioedema   Gabapentin Hives, Other (See Comments)   Only when takes over 300 mg dose. "gives me the shakes"   Vicodin [hydrocodone-acetaminophen] Hives, Diarrhea, Other (See Comments)   Stomach  cramps   Hydrocodone-acetaminophen         Medication List     TAKE these medications    acetaminophen 500 MG tablet Commonly known as: TYLENOL Take 2 tablets (1,000 mg total) by mouth every 8 (eight) hours as needed for mild pain (pain score 1-3).   albuterol 108 (90 Base) MCG/ACT inhaler Commonly known as: VENTOLIN HFA Inhale 2 puffs into the lungs every 6 (six) hours as needed. For wheezing   amoxicillin-clavulanate 875-125 MG tablet Commonly known as: AUGMENTIN Take 1 tablet by mouth 2 (two) times daily.   atorvastatin 80 MG tablet Commonly known as: LIPITOR Take 1 tablet (80 mg total) by mouth daily.   carvedilol 6.25 MG tablet Commonly known as: Coreg Take 1 tablet (6.25 mg total) by mouth 2 (two) times daily.   Centrum Women Tabs Take 1 tablet by mouth daily.   cetirizine 10 MG tablet Commonly known as: ZYRTEC Take 1 tablet (10 mg total) by mouth daily.   citalopram 20 MG tablet Commonly known as: CELEXA Take 20 mg by mouth  daily.   clopidogrel 75 MG tablet Commonly known as: PLAVIX Take 1 tablet (75 mg total) by mouth daily.   doxycycline 100 MG tablet Commonly known as: VIBRA-TABS Take 1 tablet (100 mg total) by mouth 2 (two) times daily.   DULoxetine 60 MG capsule Commonly known as: CYMBALTA Take 1 capsule (60 mg total) by mouth daily.   fluticasone 50 MCG/ACT nasal spray Commonly known as: FLONASE SHAKE LIQUID AND INSTILL ONE SPRAY IN EACH NOSTRIL EVERY DAY What changed:  how much to take how to take this when to take this reasons to take this additional instructions   Geri-kot 8.6 MG tablet Generic drug: senna TAKE TWO TABLETS BY MOUTH DAILY AT 9AM What changed: See the new instructions.   Senna 8.6 MG Caps Take 2 tablets by mouth daily. What changed: Another medication with the same name was changed. Make sure you understand how and when to take each.   hydrochlorothiazide 25 MG tablet Commonly known as: HYDRODIURIL Take 25 mg by mouth daily.   metformin 1000 MG (OSM) 24 hr tablet Commonly known as: FORTAMET Take 1,000 mg by mouth daily with breakfast.   methadone 10 MG/5ML solution Commonly known as: DOLOPHINE Take 116 mg by mouth daily.   neomycin-polymyxin-hydrocortisone 3.5-10000-1 OTIC suspension Commonly known as: CORTISPORIN Place 4 drops into the left ear 3 (three) times daily for 7 days.   nicotine 7 mg/24hr patch Commonly  known as: NICODERM CQ - dosed in mg/24 hr Place 7 mg onto the skin daily.   oxyCODONE 5 MG immediate release tablet Commonly known as: Oxy IR/ROXICODONE Take 1 tablet (5 mg total) by mouth every 6 (six) hours for 3 days.   Synjardy XR 12-998 MG Tb24 Generic drug: Empagliflozin-metFORMIN HCl ER TAKE ONE TABLET BY MOUTH DAILY AT 9AM               Durable Medical Equipment  (From admission, onward)           Start     Ordered   03/24/23 1426  For home use only DME 4 wheeled rolling walker with seat  Once       Question Answer  Comment  Patient needs a walker to treat with the following condition Physical deconditioning   Patient needs a walker to treat with the following condition Debility      03/24/23 1426   03/24/23 1130  For home use only DME Walker rolling  Once       Question Answer Comment  Walker: With 5 Inch Wheels   Patient needs a walker to treat with the following condition Decreased ambulation status      03/24/23 1130              Discharge Care Instructions  (From admission, onward)           Start     Ordered   03/25/23 0000  Leave dressing on - Keep it clean, dry, and intact until clinic visit        03/25/23 0855            Disposition and follow-up:   Rose [name] was discharged from [facility] in Good condition.  At the hospital follow up visit please address:  1.  Follow-up:  *Osteomyelitis of the great right toe status post amputation of right hallux at MPJ level -Ensure that patient completed course of doxycycline and Augmentin.  She was discharged home with a 7-day course -Ensure patient follows up with podiatry and completes annual foot exams -Please ensure patient has access to home health physical therapy and her rollator -Follow-up culture and surgical pathology results from 03/23/2023  *PAD -Ensure patient follows up with vascular surgery or has a follow-up appointment scheduled -Please ensure that patient is taking her Plavix daily   *Bilateral peripheral neuropathy primarily involving distal fingers -Please follow-up on B12 and hospital or order B12 during follow-up -Consider additional workup if indicated  *AKI -Repeat BMP during follow-up   2.  Labs / imaging needed at time of follow-up: BMP, B12 if not collected during hospitalization  3.  Pending labs/ test needing follow-up: Surgical pathology, cultures from surgery, B12, blood cultures  4.  Medication Changes  STOPPED  -None    ADDED  -Augmentin 875-125  mg twice daily for 7 days  -Doxycycline 100 mg twice daily for 7 days  -Oxycodone 5 mg as needed total of 3-day supply   MODIFIED  -None   Follow-up Appointments:  Follow-up Information     [name], MD. Schedule an appointment as soon as possible for a visit in 1 week(s).   Specialty: Internal Medicine Contact information: [address] [place] [address] [phone]         Pilar Plate, DPM. Schedule an appointment as soon as possible for a visit.   Specialty: Podiatry Why: Make an appointment with podiatry for next monday or tuesday (  12/30 or 12/31) Contact information: [address] [place] [address] [phone]         [facility] Follow up.   Why: home health services will be provided by [facility], start of care with 48 hours post discharge Contact information: [address] [place] [address] [phone]                 Hospital Course by problem list: #Osteomyelitis of Right Great Toe  #Cellulitis of Right Lower Extremity vs Venous stasis dermatitis  Patient presented with a diabetic ulcer with osteomyelitis of the right great toe after an injury 2 weeks ago.  On physical exam she also has concurrent venous stasis dermatitis vs cellulitis  that is extended to the level of the right knee.  Patient's osteomyelitis is most likely due to diagnosis of PAD as well as type 2 diabetes mellitus.  Podiatry has already been consulted and she underwent an amputation of hallux at MPJ level on the right foot. Patient was discharged home on a 7 day course of Augmentin and doxycycline, and postoperative pain medications.     #AKI vs Progressing CKD stage 3A Serum creatine is elevated at 1.98 (prior Scr was 1.51 in February). Improved to 1.20 after IVF.    Chronic Conditions:   #Essential HTN Continued home regimen of carvedilol 6.25 mg twice daily.   #PAD s/p PCI  Stent Continued home Lipitor 80 mg and Plavix was held for surgery and restarted prior to discharge.    # Tobacco use disorder Patient was administered nicotine 14 mg patches.    #CAD s/p DES Continued home Lipitor 80 mg   #T2DM CBG each morning, hold off on SSI.    #Methadone Maintenance Therapy  Restarted patient's home methadone of 115 mg, patient is seen by pain clinic denies recent IV drug use   #Chronic lower back pain Patient received Tylenol and oxycodone during hospitalization   #Depression Continued home Cymbalta 60 mg    Discharge Subjective: Today, she reports doing well and is ready to go home. She has her ride coming in at 11am and will have help at home. She reports chronic back pain but denies acute/additional complaints at this time. She understood the discharge instructions.    Discharge Exam:   Blood pressure 117/71, pulse 71, temperature (!) 97.5 F (36.4 C), temperature source Oral, resp. rate 17, height 5\' 3"  (1.6 m), weight 61.2 kg, last menstrual period 07/31/2011, SpO2 100%.   Physical Exam: General:Well appearing, NAD, laying in bed  Cardiac:RRR, no murmurs appreciated  Pulmonary:Normal effort on room air  Abdominal:soft, non-tender  Neuro:awake and alert MSK:RLE is wrapped in clean and dry dressing  Skin:warm and dry Psych:normal mood and affect   Pertinent Labs, Studies, and Procedures:     Latest Ref Rng & Units 03/24/2023    5:32 AM 03/23/2023    5:56 AM 03/22/2023    3:29 PM  CBC  WBC 4.0 - 10.5 K/uL 9.6  9.3  12.4   Hemoglobin 12.0 - 15.0 g/dL 9.4  9.2  9.7   Hematocrit 36.0 - 46.0 % 30.7  30.1  32.3   Platelets 150 - 400 K/uL 293  291  326        Latest Ref Rng & Units 03/24/2023    5:32 AM 03/23/2023    5:56 AM 03/22/2023    3:29 PM  CMP  Glucose 70 - 99 mg/dL 102  585  181   BUN 8 - 23 mg/dL 17  20  19    Creatinine 0.44 - 1.00 mg/dL 4.09  8.11  9.14   Sodium 135 - 145 mmol/L 135  137  136   Potassium 3.5 - 5.1 mmol/L  3.5  3.6  3.3   Chloride 98 - 111 mmol/L 102  105  101   CO2 22 - 32 mmol/L 25  24  24    Calcium 8.9 - 10.3 mg/dL 8.7  8.8  8.9   Total Protein 6.5 - 8.1 g/dL   7.5   Total Bilirubin <1.2 mg/dL   0.5   Alkaline Phos 38 - 126 U/L   63   AST 15 - 41 U/L   15   ALT 0 - 44 U/L   10     DG Foot 2 Views Right Result Date: 03/23/2023 CLINICAL DATA:  Postoperative right great toe amputation. EXAM: RIGHT FOOT - 2 VIEW COMPARISON:  03/22/2023 FINDINGS: Interval postoperative changes with amputation of the right first toe at the metatarsal-phalangeal level. Bony margin of resection appears intact. Soft tissues are unremarkable. No soft tissue gas or foreign body. No acute fracture or dislocation. Degenerative changes in the intertarsal joints. IMPRESSION: Postoperative amputation of the right first toe at the metatarsal-phalangeal level. No acute complication is suggested radiographically. Electronically Signed   By: Burman Nieves M.D.   On: 03/23/2023 19:28   DG Foot Complete Right Result Date: 03/22/2023 CLINICAL DATA:  Right first toe ulcer evaluation. EXAM: RIGHT FOOT COMPLETE - 3+ VIEW COMPARISON:  01/06/2020 FINDINGS: Evidence of patient's soft tissue ulceration over the distal first toe. There is moderate underlying bone destruction involving the head of the first distal phalanx compatible with osteomyelitis. Remaining bony structures and joint spaces are unremarkable. IMPRESSION: Soft tissue ulceration over the distal first toe with moderate underlying bone destruction involving the head of the first distal phalanx compatible with osteomyelitis. Electronically Signed   By: Elberta Fortis M.D.   On: 03/22/2023 17:06     Discharge Instructions: Discharge Instructions     Call MD for:  difficulty breathing, headache or visual disturbances   Complete by: As directed    Call MD for:  extreme fatigue   Complete by: As directed    Call MD for:  hives   Complete by: As directed    Call MD for:   persistant dizziness or light-headedness   Complete by: As directed    Call MD for:  persistant nausea and vomiting   Complete by: As directed    Call MD for:  redness, tenderness, or signs of infection (pain, swelling, redness, odor or green/yellow discharge around incision site)   Complete by: As directed    Call MD for:  severe uncontrolled pain   Complete by: As directed    Call MD for:  temperature >100.4   Complete by: As directed    Diet - low sodium heart healthy   Complete by: As directed    Discharge instructions   Complete by: As directed    You came to the hospital for right toe pain and you were diagnosed with osteomyelitis of the right toe.  We treated you with surgery to remove the infection.    *For your Osteomyelitis   -We have started you on these following medications:  - Oxycodone 5 mg, take one every 6 hours as needed for pain   - Tylenol, take 1,000 mg every 8 hours as needed for pain               -  Augmentin, please take one pill in the morning and one in the evening (every 12 hours) for 6 days: Take the first dose this evening when you go home               -Doxycycline, please take 1 pill in the morning and 1 pill in the evening every 12 hours for total of 6 days.  Please take this first dose this evening when you go home -You may bear wear as tolerated, please use the post operative shoe/boot we will provide you   -Please see your podiatrist in 7 to 10 days -Please see your PCP in 7-10 days   Continue to take your home medications as previously prescribed unless noted above.   Follow-up appointments: -Please see your podiatrist in 7 to 10 days -Please see your PCP in 7-10 days   If you have any questions or concerns please feel free to call: Internal medicine clinic at [phone]   If you have any of these following symptoms, please call us or seek care at an emergency department: -Chest Pain -Difficulty Breathing -Worsening foot pain -Syncope  (passing out) -Drooping of face -Slurred speech -Sudden weakness in your leg or arm -Fever -Chills   We are glad that you are feeling better, it was a pleasure to care for you!  [name] DO   Increase activity slowly   Complete by: As directed    Leave dressing on - Keep it clean, dry, and intact until clinic visit   Complete by: As directed        Signed: [name] DO [facility] - PGY1 Pager: [phone] 03/25/2023, 8:57 AM    Please contact the on call pager after 5 pm and on weekends at [phone].

## 2023-03-24 NOTE — Discharge Instructions (Addendum)
You came to the hospital for right toe pain and you were diagnosed with osteomyelitis of the right toe.  We treated you with surgery to remove the infection.    *For your Osteomyelitis   -We have started you on these following medications:  - Oxycodone 5 mg, take one every 6 hours as needed for pain   - Tylenol, take 1,000 mg every 8 hours as needed for pain               - Augmentin, please take one pill in the morning and one in the evening (every 12 hours) for 7 days: Take the first dose this evening when you go home    -Doxycycline, please take 1 pill in the morning and 1 pill in the evening every 12 hours for total of 7 days.  Please take this first dose this evening when you go home -You may bear wear as tolerated, please use the post operative shoe/boot we will provide you   -Please see your podiatrist in 7 to 10 days -Please see your PCP in 7-10 days   Continue to take your home medications as previously prescribed unless noted above.   Follow-up appointments: -Please see your podiatrist in 7 to 10 days -Please see your PCP in 7-10 days   If you have any questions or concerns please feel free to call: Internal medicine clinic at [phone]   If you have any of these following symptoms, please call us or seek care at an emergency department: -Chest Pain -Difficulty Breathing -Worsening foot pain -Syncope (passing out) -Drooping of face -Slurred speech -Sudden weakness in your leg or arm -Fever -Chills   We are glad that you are feeling better, it was a pleasure to care for you!  [name] DO

## 2023-03-24 NOTE — Progress Notes (Addendum)
Subjective:  Rose [name] is a 67 year old female with a history of peripheral arterial disease, type 2 diabetes mellitus, hypertension, methadone maintenance therapy who is presenting for a great right toe wound after she stubbed her right toe on a curb 2 weeks ago and is admitted for osteomyelitis of the R great toe.   Today, she was concerned with going home until she was going to be evaluated with PT/OT. She stated that she is having pain from her surgery. She did express concerns about bilateral numbness present on her finger tips that started a few weeks ago. She reported trying gabapentin years ago and that did not help.   Objective:  Vital signs in last 24 hours: Vitals:   03/23/23 1601 03/23/23 2103 03/24/23 0507 03/24/23 0801  BP: 139/61 (!) 156/62 (!) 152/62 (!) 108/56  Pulse: 69 73 71 76  Resp: 16 16 16 18   Temp: 98 F (36.7 C) 98.3 F (36.8 C) 98.5 F (36.9 C) 98.4 F (36.9 C)  TempSrc: Oral  Oral Oral  SpO2: 98% 100% 96% 100%  Weight:      Height:       Physical Exam: General:NAD, well appearing Cardiac:RRR, no murmurs Pulmonary:normal effort on RA Neuro:awake, alert  MSK:RLE is wrapped in clean dry dressing  Skin:warm and dry  Psych:  anxious      Latest Ref Rng & Units 03/24/2023    5:32 AM 03/23/2023    5:56 AM 03/22/2023    3:29 PM  CBC  WBC 4.0 - 10.5 K/uL 9.6  9.3  12.4   Hemoglobin 12.0 - 15.0 g/dL 9.4  9.2  9.7   Hematocrit 36.0 - 46.0 % 30.7  30.1  32.3   Platelets 150 - 400 K/uL 293  291  326        Latest Ref Rng & Units 03/24/2023    5:32 AM 03/23/2023    5:56 AM 03/22/2023    3:29 PM  BMP  Glucose 70 - 99 mg/dL 478  295  621   BUN 8 - 23 mg/dL 17  20  19    Creatinine 0.44 - 1.00 mg/dL 3.08  6.57  8.46   Sodium 135 - 145 mmol/L 135  137  136   Potassium 3.5 - 5.1 mmol/L 3.5  3.6  3.3   Chloride 98 - 111 mmol/L 102  105  101   CO2 22 - 32 mmol/L 25  24  24    Calcium 8.9 - 10.3 mg/dL 8.7  8.8  8.9       Assessment/Plan:  Principal Problem:   Osteomyelitis of great toe of right foot (HCC) Active Problems:   Type 2 diabetes mellitus with complication, without long-term current use of insulin (HCC)   Essential hypertension   Methadone maintenance therapy patient (HCC)   Depression   PAD s/p PCI with stent   CAD s/p DES to LAD (12/2019)   Wound of right foot   AKI (acute kidney injury) (HCC)   CKD stage 3a, GFR 45-59 ml/min (HCC)  #Osteomyelitis of Right Great Toe s/p right foot Amputation of hallux at MPJ level  #Venous stasis dermatitis  Patient is POD day 1 from right foot Amputation of hallux at MPJ level for osteomyelitis. Her pain is controlled, OT AND PT saw her this morning and reported that she is safe for d/c. Patient is concerned about support at home, she is waiting to hear from her neighbor.  Plan: -Augmentin BID 7 days, Doxycycline  -  Podiatry is already consulted and planning surgical interventions today  -Currently holding Plavix for PAD -PT/OT consulted -Patient is medically ready for discharge  Chronic Conditions #Essential HTN Patient is on a regimen of carvedilol 6.25 mg twice daily.  Will continue home regimen   #PAD s/p PCI Stent Continued home Lipitor 80 mg Restarted Plavix today   # Tobacco use disorder Nicotine 14 mg patches   #CAD s/p DES Continued home Lipitor 80 mg   #T2DM CBG each morning, hold off on SSI.    #Methadone Maintenance Therapy  Restarted patient's home methadone of 150 mg, patient is seen by pain clinic denies recent IV drug use   #Chronic lower back pain Tylenol, oxycodone 5mg  q4 hours prn   #Depression Continued home Cymbalta 60 mg   Resolved Problems:   __________________________________ Prior to Admission Living Arrangement:Home Anticipated Discharge Location:Home  Barriers to Discharge:medical treatment  Dispo: Anticipated discharge in approximately 1-2 day(s).   [name], DO 03/24/2023, 12:21 PM After  5pm on weekdays and 1pm on weekends: On Call pager [phone]

## 2023-03-24 NOTE — Care Management Important Message (Signed)
Important Message  Patient Details  Name: Rose [name] MRN: [id number] Date of Birth: 11-06-1955   Important Message Given:  Yes - Medicare IM     Dorena Bodo 03/24/2023, 12:22 PM

## 2023-03-24 NOTE — TOC Initial Note (Signed)
Transition of Care [facility]) - Initial/Assessment Note    Patient Details  Name: Rose [name] MRN: [id number] Date of Birth: 21-Jun-1955  [facility] CM/SW Contact:    Epifanio Lesches, RN Phone Number: 03/24/2023, 2:52 PM  Clinical Narrative:                   - S/p amputation of hallux at MPJ level, right foot, 12/23   Pt from home alone. Supportive daughter. States daughter will be able to assist with care 12/25.  Order noted for DME and home health orders. Pt agreeable. Pt without provider preference. Referral made with [facility] and accepted for home health services. Referral made with [employer] for RW, equipment will be delivered to bedside prior to discharge. Pt without RX med concerns or transportation issues. TOC team following and will assist with needs....     Patient Goals and CMS Choice     Choice offered to / list presented to : Patient      Expected Discharge Plan and Services   Discharge Planning Services: CM Consult   Living arrangements for the past 2 months: Apartment                 DME Arranged: Walker rolling with seat DME Agency: [facility] Date DME Agency Contacted: 03/24/23 Time DME Agency Contacted: 1447 Representative spoke with at DME Agency: Ian Malkin HH Arranged: PT, OT HH Agency: [facility] Date [facility] Agency Contacted: 03/24/23 Time HH Agency Contacted: 1449 Representative spoke with at [facility] Agency: Amy  Prior Living Arrangements/Services Living arrangements for the past 2 months: Apartment Lives with:: Self   Do you feel safe going back to the place where you live?: Yes               Activities of Daily Living   ADL Screening (condition at time of admission) Independently performs ADLs?: Yes (appropriate for developmental age) Is the patient deaf or have difficulty hearing?: No Does the patient have difficulty seeing, even when wearing glasses/contacts?: No Does the patient have difficulty  concentrating, remembering, or making decisions?: No  Permission Sought/Granted                  Emotional Assessment              Admission diagnosis:  Osteomyelitis of great toe of right foot (HCC) [M86.9] Patient Active Problem List   Diagnosis Date Noted   AKI (acute kidney injury) (HCC) 03/23/2023   CKD stage 3a, GFR 45-59 ml/min (HCC) 03/23/2023   Osteomyelitis of great toe of right foot (HCC) 03/22/2023   Unintentional weight loss 09/23/2022   Wound of right foot 03/26/2022   Tension headache 12/10/2021   Therapeutic opioid-induced constipation (OIC) 12/10/2021   Fall 07/01/2021   Iron deficiency anemia 11/15/2020   Hepatic steatosis    CAD s/p DES to LAD (12/2019) 01/17/2020   PAD s/p PCI with stent 08/18/2019   Allergic rhinitis 10/28/2018   Tobacco use disorder 12/13/2015   Chronic renal insufficiency 12/12/2015   Depression 12/12/2015   Methadone maintenance therapy patient (HCC)    History of substance use disorder    Chronic back pain    Healthcare maintenance 12/14/2014   Type 2 diabetes mellitus with complication, without long-term current use of insulin (HCC) 02/27/2012   Essential hypertension 02/27/2012   PCP:  [name], MD Pharmacy:   [id number] - [place], [place] - [address] AT [facility]  [address] [place] [address] Phone: [phone] Fax: [phone]  [facility] [place] - [place], [place] - [address] [place] [address] Phone: [phone] Fax: [phone]  [facility] [place] - [place], [place] - [address] [place] [address] Phone: [phone] Fax: [phone]     Social Drivers of Health (SDOH) Social History: SDOH Screenings   Food Insecurity: No Food Insecurity (03/22/2023)  Housing: Low Risk  (03/22/2023)  Transportation Needs: No Transportation Needs (03/22/2023)  Utilities: Not At Risk  (03/22/2023)  Alcohol Screen: Low Risk  (09/23/2022)  Depression (PHQ2-9): Low Risk  (02/17/2023)  Financial Resource Strain: Low Risk  (09/23/2022)  Physical Activity: Insufficiently Active (09/23/2022)  Social Connections: Moderately Isolated (09/23/2022)  Stress: No Stress Concern Present (09/23/2022)  Tobacco Use: High Risk (03/23/2023)   SDOH Interventions:     Readmission Risk Interventions     No data to display

## 2023-03-24 NOTE — Evaluation (Signed)
Physical Therapy Evaluation Patient Details Name: Rose [name] MRN: [id number] DOB: 08/07/1955 Today's Date: 03/24/2023  History of Present Illness  67 yo female R toe wound admitted with osteomyelitis and cellulitis of R great toe. Pt s/p 12/24 R Amputation of hallux at MPJ level foot PMH PAD DM2 HTn methadone maintenance therapy tobacco use  Clinical Impression  PTA, pt lives alone in an apartment with an Engineer, structural and is a limited community ambulator using a cane. Pt presents with fair pain control. Pt demonstrates ability to perform transfers and short distance ambulation with assistive device at a supervision level. Displays increased trunk/hip flexion throughout, which pt reports is baseline. Pt trialed RW vs Rollator for ambulation; pt with preference for Rollator for energy conservation. Education provided regarding post op shoe and monitoring for signs/symptoms of infection, elevating, cryotherapy as needed. Pt would benefit from HHPT to address balance, strengthening, endurance.      If plan is discharge home, recommend the following: Assistance with cooking/housework;Assist for transportation   Can travel by private Data processing manager (4 wheels)  Recommendations for Other Services       Functional Status Assessment Patient has had a recent decline in their functional status and demonstrates the ability to make significant improvements in function in a reasonable and predictable amount of time.     Precautions / Restrictions Precautions Precautions: Fall Required Braces or Orthoses: Other Brace Other Brace: post op shoe R LE Restrictions Weight Bearing Restrictions Per Provider Order: No RLE Weight Bearing Per Provider Order: Weight bearing as tolerated      Mobility  Bed Mobility Overal bed mobility: Modified Independent                  Transfers Overall transfer level: Modified independent Equipment used: Rolling  walker (2 wheels), Rollator (4 wheels)                    Ambulation/Gait Ambulation/Gait assistance: Supervision Gait Distance (Feet): 40 Feet Assistive device: Rolling walker (2 wheels), Rollator (4 wheels) Gait Pattern/deviations: Step-to pattern, Decreased stance time - right, Decreased stride length, Decreased weight shift to right, Trunk flexed Gait velocity: decreased Gait velocity interpretation: <1.8 ft/sec, indicate of risk for recurrent falls   General Gait Details: Slow and labored gait, increased trunk/hip flexion, cues for walker proximity and use. Trialed rollator vs RW with no overt LOB with either device  Stairs            Wheelchair Mobility     Tilt Bed    Modified Rankin (Stroke Patients Only)       Balance Overall balance assessment: Mild deficits observed, not formally tested                                           Pertinent Vitals/Pain Pain Assessment Pain Assessment: Faces Faces Pain Scale: Hurts little more Pain Location: surgical site Pain Descriptors / Indicators: Sore Pain Intervention(s): Limited activity within patient's tolerance, Monitored during session, Premedicated before session    Home Living Family/patient expects to be discharged to:: Private residence Living Arrangements: Alone Available Help at Discharge: Family;Available PRN/intermittently Type of Home: Apartment Home Access: Elevator       Home Layout: One level Home Equipment: Cane - single point Additional Comments: Long hallway to get to room    Prior Function  Prior Level of Function : Independent/Modified Independent               ADLs Comments: Does not drive. Daughter or neighbor picks up groceries and/or medications. Independent IADL's\     Extremity/Trunk Assessment   Upper Extremity Assessment Upper Extremity Assessment: Overall WFL for tasks assessed    Lower Extremity Assessment Lower Extremity Assessment:  Generalized weakness;RLE deficits/detail RLE Deficits / Details: s/p 1st toe amputation    Cervical / Trunk Assessment Cervical / Trunk Assessment: Normal (does report back pain during session but says thats "normal")  Communication   Communication Communication: No apparent difficulties  Cognition Arousal: Alert Behavior During Therapy: WFL for tasks assessed/performed Overall Cognitive Status: Within Functional Limits for tasks assessed                                 General Comments: does report some hearing changes and pain in L ear with prior infection with medication drops 2 weeks ago.        General Comments General comments (skin integrity, edema, etc.): VSS on RA    Exercises     Assessment/Plan    PT Assessment Patient needs continued PT services  PT Problem List Decreased strength;Decreased activity tolerance;Decreased balance;Decreased mobility;Pain       PT Treatment Interventions DME instruction;Gait training;Functional mobility training;Therapeutic activities;Therapeutic exercise;Balance training;Patient/family education    PT Goals (Current goals can be found in the Care Plan section)  Acute Rehab PT Goals Patient Stated Goal: agreeable to HHPT PT Goal Formulation: With patient Time For Goal Achievement: 04/07/23 Potential to Achieve Goals: Good    Frequency Min 1X/week     Co-evaluation               AM-PAC PT "6 Clicks" Mobility  Outcome Measure Help needed turning from your back to your side while in a flat bed without using bedrails?: None Help needed moving from lying on your back to sitting on the side of a flat bed without using bedrails?: None Help needed moving to and from a bed to a chair (including a wheelchair)?: A Little Help needed standing up from a chair using your arms (e.g., wheelchair or bedside chair)?: None Help needed to walk in hospital room?: A Little Help needed climbing 3-5 steps with a railing? : A  Lot 6 Click Score: 20    End of Session Equipment Utilized During Treatment: Other (comment) (post op shoe) Activity Tolerance: Patient tolerated treatment well Patient left: with call bell/phone within reach;in chair Nurse Communication: Mobility status PT Visit Diagnosis: Unsteadiness on feet (R26.81);Other abnormalities of gait and mobility (R26.89);Pain Pain - Right/Left: Right Pain - part of body: Ankle and joints of foot    Time: 1009-1035 PT Time Calculation (min) (ACUTE ONLY): 26 min   Charges:   PT Evaluation $PT Eval Low Complexity: 1 Low PT Treatments $Gait Training: 8-22 mins PT General Charges $$ ACUTE PT VISIT: 1 Visit         Lillia Pauls, PT, DPT Acute Rehabilitation Services Office [phone]   Norval Morton 03/24/2023, 12:51 PM

## 2023-03-25 DIAGNOSIS — M86171 Other acute osteomyelitis, right ankle and foot: Secondary | ICD-10-CM | POA: Diagnosis not present

## 2023-03-25 DIAGNOSIS — E1169 Type 2 diabetes mellitus with other specified complication: Secondary | ICD-10-CM | POA: Diagnosis not present

## 2023-03-25 MED ORDER — DOXYCYCLINE HYCLATE 100 MG PO TABS: 100.0000 mg | ORAL_TABLET | Freq: Two times a day (BID) | ORAL | 0 refills | Status: DC

## 2023-03-25 MED ORDER — AMOXICILLIN-POT CLAVULANATE 875-125 MG PO TABS: 1.0000 | ORAL_TABLET | Freq: Two times a day (BID) | ORAL | 0 refills | Status: DC

## 2023-03-25 MED ORDER — SENNA 8.6 MG PO CAPS: 2.0000 | ORAL_CAPSULE | Freq: Every day | ORAL | 2 refills | Status: AC

## 2023-03-25 MED ORDER — OXYCODONE HCL 5 MG PO TABS: 5.0000 mg | ORAL_TABLET | Freq: Four times a day (QID) | ORAL | 0 refills | Status: AC

## 2023-03-25 MED ORDER — ACETAMINOPHEN 500 MG PO TABS: 1000.0000 mg | ORAL_TABLET | Freq: Three times a day (TID) | ORAL | 0 refills | Status: DC | PRN

## 2023-03-25 NOTE — Progress Notes (Signed)
  Subjective:  Patient ID: Rose [name], female    DOB: May 05, 1955,  MRN: [id number]  Chief Complaint  Patient presents with   Skin Ulcer   Back Pain    DOS: 03/23/23 Procedure: Right great toe amputation at MPJ level  67 y.o. female seen for post op check. Reports she is doing well pain is controlled. Says she got up and walked a bit yesterday, adjusting to new balance.   Review of Systems: Negative except as noted in the HPI. Denies N/V/F/Ch.   Objective:   Vitals:   03/25/23 0002 03/25/23 0421  BP: 137/65 (!) 134/56  Pulse: 74 76  Resp: 18 17  Temp: 98.7 F (37.1 C) 98.4 F (36.9 C)  SpO2: 100% 99%   Body mass index is 23.91 kg/m. Constitutional Well developed. Well nourished.  Vascular Foot warm and well perfused. Capillary refill normal to all digits.   No calf pain with palpation  Neurologic Normal speech. Oriented to person, place, and time. Epicritic sensation absent  Dermatologic Amputaiton site healing well with no dehiscence necrosis or erythema, minimal eschar present.   Orthopedic: S/p R hallux amp MPJ level   Radiographs: Postop amp right great toe  Pathology: pending   Micro: NGTD, pending  Assessment:   Osteomyelitis of right hallux s/p amputation at MPJ level  Plan:  Patient was evaluated and treated and all questions answered.  POD # 2 s/p right great toe amputation MPJ level -Progressing very well no post op complication acutely, pain controlled -XR: Disarticulation of the great toe right foot -WB Status: WBAT in postop shoe -Sutures: to remain intact. -Medications/ABX: 7 days augmentin at DC -Foot redressed. - Return to clinic on 1/2 or 1/3. Leave dressing C/D/I until follow up. Will sign off at this time, pt stable for DC from my standpoint.         Corinna Gab, DPM [facility]

## 2023-03-25 NOTE — TOC CM/SW Note (Signed)
03-25-23 1035 Case Manager received notification from staff RN that patient wanted a rollator. [employer] has called the patient to make her aware that she will have to pay $107.00 out of pocket since she had received a rolling walker recently. Case Manager did state to the patient she can purchase one online vs a medical supply store that may be cheaper. Patient is agreeable to plan of care. No further needs identified.

## 2023-03-25 NOTE — Progress Notes (Addendum)
Discharge instructions given. Patient verbalized understanding and all questions were answered.  ?

## 2023-03-26 ENCOUNTER — Telehealth: Payer: Self-pay

## 2023-03-26 LAB — SURGICAL PATHOLOGY

## 2023-03-26 NOTE — Transitions of Care (Post Inpatient/ED Visit) (Signed)
03/26/2023  Name: Rose [name] MRN: [id number] DOB: [birth year]  Today's TOC FU Call Status: Today's TOC FU Call Status:: Successful TOC FU Call Completed TOC FU Call Complete Date: 03/26/23 Patient's Name and Date of Birth confirmed.  Transition Care Management Follow-up Telephone Call Date of Discharge: 03/25/23 Discharge Facility: [facility]) Type of Discharge: Inpatient Admission Primary Inpatient Discharge Diagnosis:: Osteomyelitis Great Toe, right foot How have you been since you were released from the hospital?: Better Any questions or concerns?: No  Items Reviewed: Did you receive and understand the discharge instructions provided?: Yes Medications obtained,verified, and reconciled?: Yes (Medications Reviewed) Any new allergies since your discharge?: No Dietary orders reviewed?: No Do you have support at home?: Yes People in Home: child(ren), adult Name of Support/Comfort Primary Source: Rose [name]  Medications Reviewed Today: Medications Reviewed Today     Reviewed by Jodelle Gross, RN (Case Manager) on 03/26/23 at 1453  Med List Status: <None>   Medication Order Taking? Sig Documenting Provider Last Dose Status Informant  acetaminophen (TYLENOL) 500 MG tablet [id number] Yes Take 2 tablets (1,000 mg total) by mouth every 8 (eight) hours as needed for mild pain (pain score 1-3). [name], DO Taking Active   albuterol (VENTOLIN HFA) 108 (90 Base) MCG/ACT inhaler [id number] Yes Inhale 2 puffs into the lungs every 6 (six) hours as needed. For wheezing Dellia Cloud, MD Taking Active Self, Pharmacy Records  amoxicillin-clavulanate (AUGMENTIN) 875-125 MG tablet [id number] Yes Take 1 tablet by mouth 2 (two) times daily. [name], DO Taking Active   atorvastatin (LIPITOR) 80 MG tablet [id number] Yes Take 1 tablet (80 mg total) by mouth daily. [name], DO Taking Active Self, Pharmacy Records           Med Note (CRUTHIS, CHLOE C   Mon Mar 23, 2023  10:03 AM) Pt is adamant she is taking this medication at home. Per pt she ran out of this medication a week ago.   carvedilol (COREG) 6.25 MG tablet [id number] Yes Take 1 tablet (6.25 mg total) by mouth 2 (two) times daily. [name], MD Taking Active Self, Pharmacy Records  cetirizine (ZYRTEC) 10 MG tablet [id number] Yes Take 1 tablet (10 mg total) by mouth daily. [name], MD Taking Active Self, Pharmacy Records  citalopram (CELEXA) 20 MG tablet [id number] Yes Take 20 mg by mouth daily. [provider] Taking Active Self, Pharmacy Records  clopidogrel (PLAVIX) 75 MG tablet [id number] Yes Take 1 tablet (75 mg total) by mouth daily. [name], MD Taking Active Self, Pharmacy Records  doxycycline (VIBRA-TABS) 100 MG tablet [id number] Yes Take 1 tablet (100 mg total) by mouth 2 (two) times daily. [name], DO Taking Active   DULoxetine (CYMBALTA) 60 MG capsule [id number] Yes Take 1 capsule (60 mg total) by mouth daily. [name], MD Taking Active Self, Pharmacy Records  fluticasone [facility]) 50 MCG/ACT nasal spray [id number] Yes SHAKE LIQUID AND INSTILL ONE SPRAY IN EACH NOSTRIL EVERY DAY  Patient taking differently: Place 1 spray into both nostrils daily as needed for allergies.   [name], MD Taking Active Self, Pharmacy Records  GERI-KOT 8.6 MG tablet [id number] Yes TAKE TWO TABLETS BY MOUTH DAILY AT 9AM  Patient taking differently: Take 2 tablets by mouth daily.   Willette Cluster, MD Taking Active Self, Pharmacy Records           Med Note [name], Rose [name] Mar 23, 2023  8:37 AM)    hydrochlorothiazide (HYDRODIURIL) 25 MG  tablet [id number] Yes Take 25 mg by mouth daily. [provider] Taking Active Self, Pharmacy Records  metformin (FORTAMET) 1000 MG (OSM) 24 hr tablet [id number] Yes Take 1,000 mg by mouth daily with breakfast. [provider] Taking Active   methadone (DOLOPHINE) 10 MG/5ML solution [id number] Yes Take 116 mg by mouth daily.  [provider] Taking Active Self, Pharmacy Records           Med Note [name], Rose [name] Mar 23, 2023  7:26 AM) Ree Kida at methadone clinic verified dose.   Multiple Vitamins-Minerals (CENTRUM WOMEN) TABS [id number] Yes Take 1 tablet by mouth daily. [provider] Taking Active Self, Pharmacy Records  neomycin-polymyxin-hydrocortisone (CORTISPORIN) 3.5-10000-1 OTIC suspension [id number] No Place 4 drops into the left ear 3 (three) times daily for 7 days.  Patient not taking: Reported on 03/23/2023   Rose Franklin, MD Not Taking Active Self, Pharmacy Records           Med Note [name], Rose [name] Mar 23, 2023  8:37 AM)    nicotine (NICODERM CQ - DOSED IN MG/24 HR) 7 mg/24hr patch [id number] No Place 7 mg onto the skin daily. [provider] Unknown Active Self, Pharmacy Records  oxyCODONE (OXY IR/ROXICODONE) 5 MG immediate release tablet [id number] Yes Take 1 tablet (5 mg total) by mouth every 6 (six) hours for 3 days. [name], DO Taking Active   Sennosides (SENNA) 8.6 MG CAPS [id number] Yes Take 2 tablets by mouth daily. [name], DO Taking Active   SYNJARDY XR 12-998 MG TB24 [id number] Yes TAKE ONE TABLET BY MOUTH DAILY AT Rose Madrid, MD Taking Active Self, Pharmacy Records  Med List Note (Cruthis, Rose [name], CPhT 03/23/23 1751):  [facility] [phone]            Home Care and Equipment/Supplies: Were Home Health Services Ordered?: Yes Name of Home Health Agency:: [facility] Has Agency set up a time to come to your home?: Yes First Home Health Visit Date: 03/27/23 Any new equipment or medical supplies ordered?: No  Functional Questionnaire: Do you need assistance with bathing/showering or dressing?: Yes Do you need assistance with meal preparation?: Yes Do you need assistance with eating?: No Do you have difficulty maintaining continence: No Do you need assistance with getting out of bed/getting out of a chair/moving?:  No Do you have difficulty managing or taking your medications?: No  Follow up appointments reviewed: PCP Follow-up appointment confirmed?: No (Patient to call and schedule appointment) MD Provider Line Number:[phone] Given: No Specialist Hospital Follow-up appointment confirmed?: Yes Date of Specialist follow-up appointment?: 04/02/23 Follow-Up Specialty Provider:: Dr. Annamary Rummage (Podiatrist) Do you need transportation to your follow-up appointment?: No Do you understand care options if your condition(s) worsen?: Yes-patient verbalized understanding  SDOH Interventions Today    Flowsheet Row Most Recent Value  SDOH Interventions   Food Insecurity Interventions Intervention Not Indicated  Housing Interventions Intervention Not Indicated  Transportation Interventions Intervention Not Indicated  Utilities Interventions Intervention Not Indicated      TOC Interventions Today    Flowsheet Row Most Recent Value  TOC Interventions   TOC Interventions Discussed/Reviewed TOC Interventions Discussed, TOC Interventions Reviewed, Contacted Home Health RN/OT/PT      Jodelle Gross RN, BSN, CCM RN Care Manager  Transitions of Care  [facility]  [phone]

## 2023-03-26 NOTE — Anesthesia Postprocedure Evaluation (Signed)
Anesthesia Post Note  Patient: Rose [name]  Procedure(s) Performed: AMPUTATION TOE (Right: Toe)     Patient location during evaluation: PACU Anesthesia Type: MAC Level of consciousness: awake and alert Pain management: pain level controlled Vital Signs Assessment: post-procedure vital signs reviewed and stable Respiratory status: spontaneous breathing, nonlabored ventilation, respiratory function stable and patient connected to nasal cannula oxygen Cardiovascular status: stable and blood pressure returned to baseline Postop Assessment: no apparent nausea or vomiting Anesthetic complications: no  No notable events documented.  Last Vitals:  Vitals:   03/25/23 0421 03/25/23 0718  BP: (!) 134/56 117/71  Pulse: 76 71  Resp: 17   Temp: 36.9 C (!) 36.4 C  SpO2: 99% 100%    Last Pain:  Vitals:   03/25/23 0800  TempSrc:   PainSc: 5    Pain Goal:                   Guerin Lashomb L Cheris Tweten

## 2023-03-27 ENCOUNTER — Encounter (HOSPITAL_COMMUNITY): Payer: Self-pay

## 2023-03-27 ENCOUNTER — Other Ambulatory Visit: Payer: Self-pay

## 2023-03-27 ENCOUNTER — Emergency Department (HOSPITAL_COMMUNITY)
Admission: EM | Admit: 2023-03-27 | Discharge: 2023-03-27 | Disposition: A | Discharge status: Home / Self Care | Payer: 59 | Attending: Emergency Medicine | Admitting: Emergency Medicine

## 2023-03-27 DIAGNOSIS — D649 Anemia, unspecified: Secondary | ICD-10-CM

## 2023-03-27 DIAGNOSIS — R062 Wheezing: Secondary | ICD-10-CM | POA: Diagnosis not present

## 2023-03-27 DIAGNOSIS — L03115 Cellulitis of right lower limb: Secondary | ICD-10-CM | POA: Diagnosis not present

## 2023-03-27 DIAGNOSIS — I959 Hypotension, unspecified: Secondary | ICD-10-CM | POA: Diagnosis present

## 2023-03-27 DIAGNOSIS — I129 Hypertensive chronic kidney disease with stage 1 through stage 4 chronic kidney disease, or unspecified chronic kidney disease: Secondary | ICD-10-CM | POA: Insufficient documentation

## 2023-03-27 DIAGNOSIS — R6889 Other general symptoms and signs: Secondary | ICD-10-CM | POA: Diagnosis not present

## 2023-03-27 DIAGNOSIS — N189 Chronic kidney disease, unspecified: Secondary | ICD-10-CM | POA: Insufficient documentation

## 2023-03-27 DIAGNOSIS — Z79899 Other long term (current) drug therapy: Secondary | ICD-10-CM | POA: Insufficient documentation

## 2023-03-27 DIAGNOSIS — I2510 Atherosclerotic heart disease of native coronary artery without angina pectoris: Secondary | ICD-10-CM | POA: Insufficient documentation

## 2023-03-27 DIAGNOSIS — R531 Weakness: Secondary | ICD-10-CM | POA: Diagnosis not present

## 2023-03-27 DIAGNOSIS — Z7984 Long term (current) use of oral hypoglycemic drugs: Secondary | ICD-10-CM | POA: Diagnosis not present

## 2023-03-27 DIAGNOSIS — D631 Anemia in chronic kidney disease: Secondary | ICD-10-CM | POA: Diagnosis not present

## 2023-03-27 DIAGNOSIS — I10 Essential (primary) hypertension: Secondary | ICD-10-CM | POA: Diagnosis not present

## 2023-03-27 DIAGNOSIS — I119 Hypertensive heart disease without heart failure: Secondary | ICD-10-CM | POA: Diagnosis not present

## 2023-03-27 DIAGNOSIS — E1122 Type 2 diabetes mellitus with diabetic chronic kidney disease: Secondary | ICD-10-CM | POA: Diagnosis not present

## 2023-03-27 DIAGNOSIS — Z743 Need for continuous supervision: Secondary | ICD-10-CM | POA: Diagnosis not present

## 2023-03-27 DIAGNOSIS — N1831 Chronic kidney disease, stage 3a: Secondary | ICD-10-CM | POA: Diagnosis not present

## 2023-03-27 DIAGNOSIS — Z7901 Long term (current) use of anticoagulants: Secondary | ICD-10-CM | POA: Diagnosis not present

## 2023-03-27 DIAGNOSIS — I739 Peripheral vascular disease, unspecified: Secondary | ICD-10-CM | POA: Diagnosis not present

## 2023-03-27 DIAGNOSIS — M86171 Other acute osteomyelitis, right ankle and foot: Secondary | ICD-10-CM | POA: Diagnosis not present

## 2023-03-27 LAB — BASIC METABOLIC PANEL
Anion gap: 11 (ref 5–15)
BUN: 19 mg/dL (ref 8–23)
CO2: 22 mmol/L (ref 22–32)
Calcium: 8.7 mg/dL — ABNORMAL LOW (ref 8.9–10.3)
Chloride: 103 mmol/L (ref 98–111)
Creatinine, Ser: 1.13 mg/dL — ABNORMAL HIGH (ref 0.44–1.00)
GFR, Estimated: 54 mL/min — ABNORMAL LOW (ref >60)
Glucose, Bld: 69 mg/dL — ABNORMAL LOW (ref 70–99)
Potassium: 3.9 mmol/L (ref 3.5–5.1)
Sodium: 136 mmol/L (ref 135–145)

## 2023-03-27 LAB — CBC
HCT: 28.5 % — ABNORMAL LOW (ref 36.0–46.0)
Hemoglobin: 8.6 g/dL — ABNORMAL LOW (ref 12.0–15.0)
MCH: 24.2 pg — ABNORMAL LOW (ref 26.0–34.0)
MCHC: 30.2 g/dL (ref 30.0–36.0)
MCV: 80.1 fL (ref 80.0–100.0)
Platelets: 291 10*3/uL (ref 150–400)
RBC: 3.56 MIL/uL — ABNORMAL LOW (ref 3.87–5.11)
RDW: 14.6 % (ref 11.5–15.5)
WBC: 9.1 10*3/uL (ref 4.0–10.5)
nRBC: 0 % (ref 0.0–0.2)

## 2023-03-27 LAB — CULTURE, BLOOD (ROUTINE X 2)
Culture: NO GROWTH
Culture: NO GROWTH
Special Requests: ADEQUATE

## 2023-03-27 LAB — I-STAT CG4 LACTIC ACID, ED: Lactic Acid, Venous: 1.4 mmol/L (ref 0.5–1.9)

## 2023-03-27 NOTE — ED Provider Notes (Signed)
[place] EMERGENCY DEPARTMENT AT [facility] Provider Note   CSN: [id number] Arrival date & time: 03/27/23  1531     History  Chief Complaint  Patient presents with   Hypotension    Rose [name] is a 67 y.o. female.  HPI   Patient has history of diabetes hypertension chronic back pain, chronic kidney disease, coronary artery disease, chronic prescribed opiate use and recent toe amputation.  Patient presented to the ED for evaluation of hypotension.  Patient states her home health aide was evaluating her today and documented her blood pressure in the 60s.  She was sent to the ED for further evaluation.  Patient denies having any fevers or chills.  She states she was feeling well and really did not think she needed to come to the hospital.  She is not having any chest pain.  No abdominal pain.  No cough.  No vomiting or diarrhea.  Patient denies any pain or increased drainage at her amputation site.  Notes indicate that she had amputation of her great toe on December 23 for diabetic ulcer of the toe  Home Medications Prior to Admission medications   Medication Sig Start Date End Date Taking? Authorizing Provider  acetaminophen (TYLENOL) 500 MG tablet Take 2 tablets (1,000 mg total) by mouth every 8 (eight) hours as needed for mild pain (pain score 1-3). 03/25/23   [name], DO  albuterol (VENTOLIN HFA) 108 (90 Base) MCG/ACT inhaler Inhale 2 puffs into the lungs every 6 (six) hours as needed. For wheezing 12/04/20   Dellia Cloud, MD  amoxicillin-clavulanate (AUGMENTIN) 875-125 MG tablet Take 1 tablet by mouth 2 (two) times daily. 03/25/23   [name], DO  atorvastatin (LIPITOR) 80 MG tablet Take 1 tablet (80 mg total) by mouth daily. 02/19/23   Atway, Rayann N, DO  carvedilol (COREG) 6.25 MG tablet Take 1 tablet (6.25 mg total) by mouth 2 (two) times daily. 01/07/23 01/07/24  [name], MD  cetirizine (ZYRTEC) 10 MG tablet Take 1 tablet (10 mg total) by  mouth daily. 12/16/22   [name], MD  citalopram (CELEXA) 20 MG tablet Take 20 mg by mouth daily. 03/10/23   [provider]  clopidogrel (PLAVIX) 75 MG tablet Take 1 tablet (75 mg total) by mouth daily. 12/10/22   [name], MD  doxycycline (VIBRA-TABS) 100 MG tablet Take 1 tablet (100 mg total) by mouth 2 (two) times daily. 03/25/23   [name], DO  DULoxetine (CYMBALTA) 60 MG capsule Take 1 capsule (60 mg total) by mouth daily. 02/19/23   [name], MD  fluticasone (FLONASE) 50 MCG/ACT nasal spray SHAKE LIQUID AND INSTILL ONE SPRAY IN EACH NOSTRIL EVERY DAY Patient taking differently: Place 1 spray into both nostrils daily as needed for allergies. 12/10/22   [name], MD  GERI-KOT 8.6 MG tablet TAKE TWO TABLETS BY MOUTH DAILY AT 9AM Patient taking differently: Take 2 tablets by mouth daily. 06/04/22   Willette Cluster, MD  hydrochlorothiazide (HYDRODIURIL) 25 MG tablet Take 25 mg by mouth daily. 03/20/23   [provider]  metformin (FORTAMET) 1000 MG (OSM) 24 hr tablet Take 1,000 mg by mouth daily with breakfast.    [provider]  methadone (DOLOPHINE) 10 MG/5ML solution Take 116 mg by mouth daily.    [provider]  Multiple Vitamins-Minerals (CENTRUM WOMEN) TABS Take 1 tablet by mouth daily.    [provider]  neomycin-polymyxin-hydrocortisone (CORTISPORIN) 3.5-10000-1 OTIC suspension Place 4 drops into the left  ear 3 (three) times daily for 7 days. Patient not taking: Reported on 03/23/2023 02/03/23   [name], MD  nicotine (NICODERM CQ - DOSED IN MG/24 HR) 7 mg/24hr patch Place 7 mg onto the skin daily.    [provider]  oxyCODONE (OXY IR/ROXICODONE) 5 MG immediate release tablet Take 1 tablet (5 mg total) by mouth every 6 (six) hours for 3 days. 03/25/23 03/28/23  [name], DO  Sennosides (SENNA) 8.6 MG CAPS Take 2 tablets by mouth daily. 03/25/23   [name], DO  SYNJARDY XR 12-998 MG TB24 TAKE  ONE TABLET BY MOUTH DAILY AT 9AM 11/06/22   [name], MD      Allergies    Lisinopril, Gabapentin, Vicodin [hydrocodone-acetaminophen], and Hydrocodone-acetaminophen    Review of Systems   Review of Systems  Physical Exam Updated Vital Signs BP (!) 119/52 (BP Location: Right Arm)   Pulse 83   Temp 98.4 F (36.9 C) (Oral)   Resp 16   Ht 1.6 m (5\' 3" )   Wt 61.2 kg   LMP 07/31/2011   SpO2 100%   BMI 23.91 kg/m  Physical Exam Vitals and nursing note reviewed.  Constitutional:      Appearance: She is well-developed. She is not diaphoretic.  HENT:     Head: Normocephalic and atraumatic.     Right Ear: External ear normal.     Left Ear: External ear normal.  Eyes:     General: No scleral icterus.       Right eye: No discharge.        Left eye: No discharge.     Conjunctiva/sclera: Conjunctivae normal.  Neck:     Trachea: No tracheal deviation.  Cardiovascular:     Rate and Rhythm: Normal rate and regular rhythm.  Pulmonary:     Effort: Pulmonary effort is normal. No respiratory distress.     Breath sounds: Normal breath sounds. No stridor. No wheezing or rales.  Abdominal:     General: Bowel sounds are normal. There is no distension.     Palpations: Abdomen is soft.     Tenderness: There is no abdominal tenderness. There is no guarding or rebound.  Musculoskeletal:        General: No tenderness or deformity.     Cervical back: Neck supple.     Comments: Dressing in place right foot, no drainage noted, extremities warm, no surrounding erythema  Skin:    General: Skin is warm and dry.     Findings: No rash.  Neurological:     General: No focal deficit present.     Mental Status: She is alert.     Cranial Nerves: No cranial nerve deficit, dysarthria or facial asymmetry.     Sensory: No sensory deficit.     Motor: No abnormal muscle tone or seizure activity.     Coordination: Coordination normal.  Psychiatric:        Mood and Affect: Mood normal.     ED  Results / Procedures / Treatments   Labs (all labs ordered are listed, but only abnormal results are displayed) Labs Reviewed  CBC - Abnormal; Notable for the following components:      Result Value   RBC 3.56 (*)    Hemoglobin 8.6 (*)    HCT 28.5 (*)    MCH 24.2 (*)    All other components within normal limits  BASIC METABOLIC PANEL - Abnormal; Notable for the following components:   Glucose, Bld 69 (*)  Creatinine, Ser 1.13 (*)    Calcium 8.7 (*)    GFR, Estimated 54 (*)    All other components within normal limits  I-STAT CG4 LACTIC ACID, ED  I-STAT CG4 LACTIC ACID, ED    EKG EKG Interpretation Date/Time:  Friday March 27 2023 15:49:16 EST Ventricular Rate:  75 PR Interval:  143 QRS Duration:  98 QT Interval:  462 QTC Calculation: 517 R Axis:   78  Text Interpretation: Sinus rhythm Abnormal R-wave progression, early transition Prolonged QT interval Artifact in lead(s) I II III aVR aVL aVF V6 No significant change since last tracing Confirmed by Linwood Dibbles [phone]) on 03/27/2023 5:09:32 PM  Radiology No results found.  Procedures Procedures    Medications Ordered in ED Medications - No data to display  ED Course/ Medical Decision Making/ A&P Clinical Course as of 03/27/23 1958  Fri Mar 27, 2023  1952 Patient's lactic acid level is normal.  Metabolic panel shows stable creatinine of 1.13 [JK]  1952 CBC(!) Anemia noted similar to previous values [JK]    Clinical Course User Index [JK] Linwood Dibbles, MD                                 Medical Decision Making Problems Addressed: Anemia, unspecified type: chronic illness or injury  Amount and/or Complexity of Data Reviewed Labs: ordered. Decision-making details documented in ED Course.   Patient presented to the ED for evaluation of hypotension measured at home.  Patient has been asymptomatic.  She has not been feeling weak or lightheaded.  She has not had any trouble with fevers or chills.  Patient did  have recent toe amputation.  Her laboratory tests are reassuring.  She does not have any findings to suggest sick systemic infection.  Her anemia is stable.  No findings to suggest acute blood loss.  Patient is not dehydrated.  Her blood pressure has remained normal the entire time she has been in the ED.  Evaluation and diagnostic testing in the emergency department does not suggest an emergent condition requiring admission or immediate intervention beyond what has been performed at this time.  The patient is safe for discharge and has been instructed to return immediately for worsening symptoms, change in symptoms or any other concerns.        Final Clinical Impression(s) / ED Diagnoses Final diagnoses:  Anemia, unspecified type    Rx / DC Orders ED Discharge Orders     None         Linwood Dibbles, MD 03/27/23 [phone]

## 2023-03-27 NOTE — ED Triage Notes (Addendum)
Pt arrives via EMS from home. Pt recently had her right great toe amputated. Home health/PT came to visit her at home and her BP was in the 60s. Pt has not been hypotensive with them. Pt arrives AxOx4. She was given 2 duonebs due to some wheezing. Pt currently denies any complaints. States she did not want to come to the ED but her daughter insisted.

## 2023-03-27 NOTE — Discharge Instructions (Signed)
Your blood test did not show signs to suggest severe infection.  You are mildly anemic but this is unchanged compared to previous results.  The lab test did not show any signs of severe dehydration.  Your blood pressure has been normal in the ED.  Follow-up with your surgeon as planned

## 2023-03-28 LAB — AEROBIC/ANAEROBIC CULTURE W GRAM STAIN (SURGICAL/DEEP WOUND): Culture: NO GROWTH

## 2023-03-31 LAB — ACID FAST SMEAR (AFB, MYCOBACTERIA)

## 2023-03-31 LAB — ACID FAST CULTURE WITH REFLEXED SENSITIVITIES (MYCOBACTERIA)

## 2023-04-02 ENCOUNTER — Ambulatory Visit (INDEPENDENT_AMBULATORY_CARE_PROVIDER_SITE_OTHER): Payer: 59 | Admitting: Podiatry

## 2023-04-02 DIAGNOSIS — Z91199 Patient's noncompliance with other medical treatment and regimen due to unspecified reason: Secondary | ICD-10-CM

## 2023-04-02 NOTE — Progress Notes (Signed)
 No show for apt.

## 2023-04-03 ENCOUNTER — Ambulatory Visit (INDEPENDENT_AMBULATORY_CARE_PROVIDER_SITE_OTHER): Payer: 59 | Admitting: Podiatry

## 2023-04-03 DIAGNOSIS — E1151 Type 2 diabetes mellitus with diabetic peripheral angiopathy without gangrene: Secondary | ICD-10-CM | POA: Diagnosis not present

## 2023-04-03 DIAGNOSIS — Z89419 Acquired absence of unspecified great toe: Secondary | ICD-10-CM

## 2023-04-03 MED ORDER — OXYCODONE-ACETAMINOPHEN 5-325 MG PO TABS: 1.0000 | ORAL_TABLET | ORAL | 0 refills | Status: DC | PRN

## 2023-04-03 NOTE — Progress Notes (Signed)
 Chief Complaint  Patient presents with   Follow-up    RT 1st hallux amputation. Is having some pain at night-sharp and aching in nature. Does seem to be constant once it starts. Meds do seem to help some.   Date of surgery: 03/23/2023 Procedure: Right first toe amputation with a level of the MPJ with Dr. Malvin  HPI: 68 y.o. female presents today as hospital follow-up status post right first toe amputation.  Pain overall has been controlled with medication.  Does complain of some pain in the evening.  Denies any strikethrough to the dressing or signs and symptoms of infection.  Past Medical History:  Diagnosis Date   ACE inhibitor-aggravated angioedema, initial encounter 04/26/2016   Allergic rhinitis    Anxiety    Arthritis    Calculus of gallbladder without cholecystitis without obstruction 10/28/2018   Chronic back pain    CKD (chronic kidney disease), stage III (HCC)    folllowed by pcp   Coronary artery disease    Depression    Diabetic neuropathy (HCC)    Edema of lower extremity 10/28/2018   Encounter for completion of form with patient 06/21/2019   Environmental allergies    Environmental allergies 12/28/2015   Fibromyalgia    Foot ulcer secondary to diabetes 06/21/2019   Frequency of urination    History of chronic bronchitis    History of drug abuse (HCC) states quit herion in 2006   History of hepatitis C last abd ultrasound in epic 11-10-2018;  last hepative panel in epic 11-30-2018 was normal   previously followed by [facility] (notes in care everywhere)-- dx 04/ 2015,  started harvonic treatment 07/ 2015   History of rib fracture 10/28/2018   HPV in female 06/11/2016   Hyperlipidemia associated with type 2 diabetes mellitus (HCC) 12/13/2015   Hypertension    followed by pcp  (nuclear stress test 01-15-2017 in epic,  showed low risk normal w/ nuclear ef 55%)   Laceration of right foot 03/04/2019   Lipoma of neck 07/16/2017   Seen  by general surgery Dr Curvin May 16 - plan for removal of 2cm sebaceous cyst.    Methadone  maintenance therapy patient (HCC)    Mild asthma    followed by pcp   Nocturia    PAD (peripheral artery disease) (HCC)    followed by cardiology, dr darron---  06-01-2019  s/p bilateral CIA angioplasty stenting   Recurrent falls 04/30/2016   Right foot ulcer (HCC)    Symptomatic anemia 11/15/2020   Tendonitis    bilateral wrist   Type 2 diabetes mellitus (HCC)    followed by pcp   (06-16-2019  per pt checks blood sugar every other day in am,  fasting sugar 92-95)   Urge urinary incontinence     Past Surgical History:  Procedure Laterality Date   ABDOMINAL AORTOGRAM W/LOWER EXTREMITY Bilateral 06/01/2019   Procedure: ABDOMINAL AORTOGRAM W/LOWER EXTREMITY;  Surgeon: Darron Deatrice LABOR, MD;  [facility]: [facility];  Service: Cardiovascular;  Laterality: Bilateral;   ABDOMINAL AORTOGRAM W/LOWER EXTREMITY N/A 06/04/2022   Procedure: ABDOMINAL AORTOGRAM W/LOWER EXTREMITY;  Surgeon: Darron Deatrice LABOR, MD;  Location: [facility];  Service: Cardiovascular;  Laterality: N/A;   AMPUTATION TOE Right 03/23/2023   Procedure: AMPUTATION TOE;  Surgeon: Malvin Marsa FALCON, DPM;  Location: [facility];  Service: Orthopedics/Podiatry;  Laterality: Right;  Right great toe amputation   APPENDECTOMY  1972   BREAST BIOPSY Right  09/24/2022   MM RT BREAST BX W LOC DEV 1ST LESION IMAGE BX SPEC STEREO GUIDE 09/24/2022 [facility]MO[facility]AP[facility]   INTERSTIM IMPLANT PLACEMENT  10/23/2011   Procedure: RENNA IMPLANT FIRST STAGE;  Surgeon: Glendia DELENA Elizabeth, MD;  Location: [facility];  Service: Urology;  Laterality: N/A;   INTERSTIM IMPLANT PLACEMENT  10/23/2011   Procedure: RENNA IMPLANT SECOND STAGE;  Surgeon: Glendia DELENA Elizabeth, MD;  Location: [facility];  Service: Urology;  Laterality: N/A;   LEFT HEART CATH AND CORONARY ANGIOGRAPHY N/A 01/11/2020   Procedure: LEFT HEART CATH AND CORONARY  ANGIOGRAPHY;  Surgeon: Darron Deatrice DELENA, MD;  Location: [facility];  Service: Cardiovascular;  Laterality: N/A;   PANACOS TISSUE GRAFT Right 06/24/2019   Procedure: Application of Skin Graft Substitute;  Surgeon: Gretel Ozell PARAS, DPM;  Location: [facility];  Service: Podiatry;  Laterality: Right;   PERIPHERAL VASCULAR INTERVENTION Bilateral 06/01/2019   Procedure: PERIPHERAL VASCULAR INTERVENTION;  Surgeon: Darron Deatrice DELENA, MD;  [facility]: [facility];  Service: Cardiovascular;  Laterality: Bilateral;   WOUND DEBRIDEMENT Right 06/24/2019   Procedure: DEBRIDEMENT WOUND;  Surgeon: Gretel Ozell PARAS, DPM;  Location: [facility];  Service: Podiatry;  Laterality: Right;   WOUND DEBRIDEMENT Right 08/24/2019   Procedure: DEBRIDEMENT RIGHT FOOT WOUND; APPLICATION OF SKIN GRAFT SUBSTITUTE;  Surgeon: Gretel Ozell PARAS, DPM;  Location: [facility] ;  Service: Podiatry;  Laterality: Right;    Allergies  Allergen Reactions   Lisinopril  Swelling    Angioedema   Gabapentin  Hives and Other (See Comments)    Only when takes over 300 mg dose. gives me the shakes   Vicodin [Hydrocodone-Acetaminophen ] Hives, Diarrhea and Other (See Comments)    Stomach  cramps   Hydrocodone-Acetaminophen      ROS denies any nausea, long, fever, chills, chest pain, shortness of breath.   Physical Exam: There were no vitals filed for this visit.  General: The patient is alert and oriented x3 in no acute distress.  Dermatology: Sutures intact to right hallux amputation site.  Skin edges well-approximated, well coapted.  No signs of dehiscence or gapping.  No drainage appreciated.  No surrounding erythema.  Mild edema within postoperative limits  Vascular: Foot appears warm and well-perfused.  Cap refill intact to the remaining digits.  Neurological: Light touch sensation grossly intact bilateral feet.   Musculoskeletal Exam: Status post right hallux  amputation  Radiographic Exam:  Deferred today  Assessment/Plan of Care: 1. Type II diabetes mellitus with peripheral circulatory disorder (HCC)   2. History of amputation of great toe (HCC)      Meds ordered this encounter  Medications   oxyCODONE -acetaminophen  (PERCOCET/ROXICET) 5-325 MG tablet    Sig: Take 1 tablet by mouth every 4 (four) hours as needed for up to 16 doses for severe pain (pain score 7-10).    Dispense:  16 tablet    Refill:  0   None  Discussed clinical findings with patient today.  Plan: -Surgical site dressed with Xeroform, 4 x 4 gauze, Kerlix, Ace wrap - May weight-bear as tolerated in surgical shoe favoring the heel - She has completed Augmentin  at this time.  No signs of infection at this time. -Sending some pain medication as patient is still having some pain at night and is within postoperative course -Overall progressing well -Leave dressing clean dry and intact until follow-up appoint with Dr. Malvin. -Follow-up in approximately 1 week for possible suture removal.  Honestie Kulik L. Lamount MAUL, AACFAS [facility]     [address] [place], [place] [address]                Office [phone]  Fax [phone]

## 2023-04-06 ENCOUNTER — Telehealth: Payer: Self-pay | Admitting: *Deleted

## 2023-04-06 DIAGNOSIS — I119 Hypertensive heart disease without heart failure: Secondary | ICD-10-CM | POA: Diagnosis not present

## 2023-04-06 DIAGNOSIS — Z7984 Long term (current) use of oral hypoglycemic drugs: Secondary | ICD-10-CM | POA: Diagnosis not present

## 2023-04-06 DIAGNOSIS — I2510 Atherosclerotic heart disease of native coronary artery without angina pectoris: Secondary | ICD-10-CM | POA: Diagnosis not present

## 2023-04-06 DIAGNOSIS — I739 Peripheral vascular disease, unspecified: Secondary | ICD-10-CM | POA: Diagnosis not present

## 2023-04-06 DIAGNOSIS — L03115 Cellulitis of right lower limb: Secondary | ICD-10-CM | POA: Diagnosis not present

## 2023-04-06 DIAGNOSIS — N1831 Chronic kidney disease, stage 3a: Secondary | ICD-10-CM | POA: Diagnosis not present

## 2023-04-06 DIAGNOSIS — E1122 Type 2 diabetes mellitus with diabetic chronic kidney disease: Secondary | ICD-10-CM | POA: Diagnosis not present

## 2023-04-06 DIAGNOSIS — M86171 Other acute osteomyelitis, right ankle and foot: Secondary | ICD-10-CM | POA: Diagnosis not present

## 2023-04-06 NOTE — Telephone Encounter (Signed)
 Call from Will OT with [facility] requesting verbal order "OT 2 times a week x 3 weeks". Stated pt's appt is very cluttered; high fall risk. VO given - sending to [employer] for approval or denial.

## 2023-04-09 ENCOUNTER — Encounter: Payer: Self-pay | Admitting: Student

## 2023-04-09 ENCOUNTER — Ambulatory Visit (INDEPENDENT_AMBULATORY_CARE_PROVIDER_SITE_OTHER): Payer: 59 | Admitting: Student

## 2023-04-09 ENCOUNTER — Ambulatory Visit (INDEPENDENT_AMBULATORY_CARE_PROVIDER_SITE_OTHER): Payer: 59 | Admitting: Podiatry

## 2023-04-09 VITALS — BP 152/66 | HR 84 | Temp 97.8°F | Ht 63.0 in | Wt 137.2 lb

## 2023-04-09 DIAGNOSIS — E1151 Type 2 diabetes mellitus with diabetic peripheral angiopathy without gangrene: Secondary | ICD-10-CM

## 2023-04-09 DIAGNOSIS — I739 Peripheral vascular disease, unspecified: Secondary | ICD-10-CM

## 2023-04-09 DIAGNOSIS — E1140 Type 2 diabetes mellitus with diabetic neuropathy, unspecified: Secondary | ICD-10-CM

## 2023-04-09 DIAGNOSIS — G609 Hereditary and idiopathic neuropathy, unspecified: Secondary | ICD-10-CM

## 2023-04-09 DIAGNOSIS — E118 Type 2 diabetes mellitus with unspecified complications: Secondary | ICD-10-CM | POA: Diagnosis not present

## 2023-04-09 DIAGNOSIS — M869 Osteomyelitis, unspecified: Secondary | ICD-10-CM | POA: Diagnosis not present

## 2023-04-09 DIAGNOSIS — N179 Acute kidney failure, unspecified: Secondary | ICD-10-CM | POA: Diagnosis not present

## 2023-04-09 DIAGNOSIS — Z7984 Long term (current) use of oral hypoglycemic drugs: Secondary | ICD-10-CM

## 2023-04-09 DIAGNOSIS — I10 Essential (primary) hypertension: Secondary | ICD-10-CM

## 2023-04-09 DIAGNOSIS — F1120 Opioid dependence, uncomplicated: Secondary | ICD-10-CM

## 2023-04-09 DIAGNOSIS — Z89419 Acquired absence of unspecified great toe: Secondary | ICD-10-CM | POA: Diagnosis not present

## 2023-04-09 DIAGNOSIS — G629 Polyneuropathy, unspecified: Secondary | ICD-10-CM | POA: Insufficient documentation

## 2023-04-09 MED ORDER — GABAPENTIN 100 MG PO CAPS: 100.0000 mg | ORAL_CAPSULE | Freq: Every day | ORAL | 0 refills | Status: DC

## 2023-04-09 NOTE — Progress Notes (Signed)
 Date of surgery: 03/23/2023 Procedure: Right first toe amputation with a level of the MPJ with Dr. Malvin  HPI: 68 y.o. female presents today as hospital follow-up status post right first toe amputation.  2.5 weeks after surgery.  She reports she is doing well pain is decreasing.  Has left the right foot and dry with dressing intact since last appointment with Dr. Lamount on April 03, 2023  Past Medical History:  Diagnosis Date   ACE inhibitor-aggravated angioedema, initial encounter 04/26/2016   Allergic rhinitis    Anxiety    Arthritis    Calculus of gallbladder without cholecystitis without obstruction 10/28/2018   Chronic back pain    CKD (chronic kidney disease), stage III (HCC)    folllowed by pcp   Coronary artery disease    Depression    Diabetic neuropathy (HCC)    Edema of lower extremity 10/28/2018   Encounter for completion of form with patient 06/21/2019   Environmental allergies    Environmental allergies 12/28/2015   Fibromyalgia    Foot ulcer secondary to diabetes 06/21/2019   Frequency of urination    History of chronic bronchitis    History of drug abuse (HCC) states quit herion in 2006   History of hepatitis C last abd ultrasound in epic 11-10-2018;  last hepative panel in epic 11-30-2018 was normal   previously followed [facility] (notes in care everywhere)-- dx 04/ 2015,  started harvonic treatment 07/ 2015   History of rib fracture 10/28/2018   HPV in female 06/11/2016   Hyperlipidemia associated with type 2 diabetes mellitus (HCC) 12/13/2015   Hypertension    followed by pcp  (nuclear stress test 01-15-2017 in epic,  showed low risk normal w/ nuclear ef 55%)   Laceration of right foot 03/04/2019   Lipoma of neck 07/16/2017   Seen by general surgery Dr Curvin May 16 - plan for removal of 2cm sebaceous cyst.    Methadone  maintenance therapy patient (HCC)    Mild asthma    followed by pcp   Nocturia    PAD (peripheral  artery disease) (HCC)    followed by cardiology, dr darron---  06-01-2019  s/p bilateral CIA angioplasty stenting   Recurrent falls 04/30/2016   Right foot ulcer (HCC)    Symptomatic anemia 11/15/2020   Tendonitis    bilateral wrist   Type 2 diabetes mellitus (HCC)    followed by pcp   (06-16-2019  per pt checks blood sugar every other day in am,  fasting sugar 92-95)   Urge urinary incontinence     Past Surgical History:  Procedure Laterality Date   ABDOMINAL AORTOGRAM W/LOWER EXTREMITY Bilateral 06/01/2019   Procedure: ABDOMINAL AORTOGRAM W/LOWER EXTREMITY;  Surgeon: Darron Deatrice LABOR, MD;  [facility];  Service: Cardiovascular;  Laterality: Bilateral;   ABDOMINAL AORTOGRAM W/LOWER EXTREMITY N/A 06/04/2022   Procedure: ABDOMINAL AORTOGRAM W/LOWER EXTREMITY;  Surgeon: Darron Deatrice LABOR, MD;  [facility];  Service: Cardiovascular;  Laterality: N/A;   AMPUTATION TOE Right 03/23/2023   Procedure: AMPUTATION TOE;  Surgeon: Malvin Marsa FALCON, DPM;  [facility];  Service: Orthopedics/Podiatry;  Laterality: Right;  Right great toe amputation   APPENDECTOMY  1972   BREAST BIOPSY Right 09/24/2022   MM RT BREAST BX W LOC DEV 1ST LESION IMAGE BX SPEC STEREO GUIDE 09/24/2022 [facility] MAMMOGRAPHY   INTERSTIM IMPLANT PLACEMENT  10/23/2011   Procedure: INTERSTIM IMPLANT FIRST STAGE;  Surgeon:  Glendia DELENA Elizabeth, MD;  Location: [facility];  Service: Urology;  Laterality: N/A;   INTERSTIM IMPLANT PLACEMENT  10/23/2011   Procedure: RENNA IMPLANT SECOND STAGE;  Surgeon: Glendia DELENA Elizabeth, MD;  Location: [facility];  Service: Urology;  Laterality: N/A;   LEFT HEART CATH AND CORONARY ANGIOGRAPHY N/A 01/11/2020   Procedure: LEFT HEART CATH AND CORONARY ANGIOGRAPHY;  Surgeon: Darron Deatrice DELENA, MD;  [facility];  Service: Cardiovascular;  Laterality: N/A;   PANACOS TISSUE GRAFT Right 06/24/2019   Procedure: Application of  Skin Graft Substitute;  Surgeon: Gretel Ozell PARAS, DPM;  Location: [facility] [place];  Service: Podiatry;  Laterality: Right;   PERIPHERAL VASCULAR INTERVENTION Bilateral 06/01/2019   Procedure: PERIPHERAL VASCULAR INTERVENTION;  Surgeon: Darron Deatrice DELENA, MD;  [facility];  Service: Cardiovascular;  Laterality: Bilateral;   WOUND DEBRIDEMENT Right 06/24/2019   Procedure: DEBRIDEMENT WOUND;  Surgeon: Gretel Ozell PARAS, DPM;  Location: [facility];  Service: Podiatry;  Laterality: Right;   WOUND DEBRIDEMENT Right 08/24/2019   Procedure: DEBRIDEMENT RIGHT FOOT WOUND; APPLICATION OF SKIN GRAFT SUBSTITUTE;  Surgeon: Gretel Ozell PARAS, DPM;  Location: [facility] ;  Service: Podiatry;  Laterality: Right;    Allergies  Allergen Reactions   Lisinopril  Swelling    Angioedema   Gabapentin  Hives and Other (See Comments)    Only when takes over 300 mg dose. gives me the shakes   Vicodin [Hydrocodone-Acetaminophen ] Hives, Diarrhea and Other (See Comments)    Stomach  cramps   Hydrocodone-Acetaminophen      Physical Exam: There were no vitals filed for this visit.  General: The patient is alert and oriented x3 in no acute distress.  Dermatology: Sutures intact to right hallux amputation site.  Skin edges well-approximated, well coapted.  No signs of dehiscence or gapping.  No drainage appreciated.  No surrounding erythema.  Mild edema within postoperative limits  Vascular: Foot appears warm and well-perfused.  Cap refill intact to the remaining digits.  Neurological: Light touch sensation grossly intact bilateral feet.   Musculoskeletal Exam: Status post right hallux amputation  Radiographic Exam:  Deferred today  Assessment/Plan of Care: 1. History of amputation of great toe (HCC)   2. Type II diabetes mellitus with peripheral circulatory disorder (HCC)       No orders of the defined types were placed in this  encounter.  None  Discussed clinical findings with patient today.  Plan: Postop 2 and half weeks s/p right great toe amputation MPJ level -Progressing well nearly completely healed at this time -XR: Deferred -WB Status: Weightbearing as tolerated in postop shoe and transition to regular shoe at this point -Sutures: Removed in total at this visit. -Medications/ABX: No further indication for antibiotics -Foot antibiotic ointment and Band-Aids.  Explained she can now wash the area with warm soap and water can dry and apply Band-Aid as needed or desired otherwise leave open to air -Follow-up with Dr. May her routine foot care provider in 1 to 2 months call if any questions or concerns arise in the interim

## 2023-04-09 NOTE — Progress Notes (Signed)
 Established Patient Office Visit  Subjective   Patient ID: Rose [name], female    DOB: 04-Nov-1955  Age: 68 y.o. MRN: [id number]  Chief Complaint  Patient presents with   Hospitalization Follow-up    HPI This is a 68 year old female living with a history stated below and presents today for HFU. Please see problem based assessment and plan for additional details.  Patient Active Problem List   Diagnosis Date Noted   Bilateral peripheral neuropathy primary involving distal fingers 04/09/2023   AKI (acute kidney injury) (HCC) 03/23/2023   CKD stage 3a, GFR 45-59 ml/min (HCC) 03/23/2023   Unintentional weight loss 09/23/2022   Osteomyelitis of great toe of right foot (HCC) 03/26/2022   Tension headache 12/10/2021   Therapeutic opioid-induced constipation (OIC) 12/10/2021   Fall 07/01/2021   Iron deficiency anemia 11/15/2020   Hepatic steatosis    CAD s/p DES to LAD (12/2019) 01/17/2020   PAD s/p PCI with stent 08/18/2019   Allergic rhinitis 10/28/2018   Tobacco use disorder 12/13/2015   Chronic renal insufficiency 12/12/2015   Depression 12/12/2015   Methadone  maintenance therapy patient (HCC)    History of substance use disorder    Chronic back pain    Healthcare maintenance 12/14/2014   Type 2 diabetes mellitus with complication, without long-term current use of insulin  (HCC) 02/27/2012   Essential hypertension 02/27/2012   Past Medical History:  Diagnosis Date   ACE inhibitor-aggravated angioedema, initial encounter 04/26/2016   Allergic rhinitis    Anxiety    Arthritis    Calculus of gallbladder without cholecystitis without obstruction 10/28/2018   Chronic back pain    CKD (chronic kidney disease), stage III (HCC)    folllowed by pcp   Coronary artery disease    Depression    Diabetic neuropathy (HCC)    Edema of lower extremity 10/28/2018   Encounter for completion of form with patient 06/21/2019   Environmental allergies    Environmental  allergies 12/28/2015   Fibromyalgia    Foot ulcer secondary to diabetes 06/21/2019   Frequency of urination    History of chronic bronchitis    History of drug abuse (HCC) states quit herion in 2006   History of hepatitis C last abd ultrasound in epic 11-10-2018;  last hepative panel in epic 11-30-2018 was normal   previously followed by [facility] (notes in care everywhere)-- dx 04/ 2015,  started harvonic treatment 07/ 2015   History of rib fracture 10/28/2018   HPV in female 06/11/2016   Hyperlipidemia associated with type 2 diabetes mellitus (HCC) 12/13/2015   Hypertension    followed by pcp  (nuclear stress test 01-15-2017 in epic,  showed low risk normal w/ nuclear ef 55%)   Laceration of right foot 03/04/2019   Lipoma of neck 07/16/2017   Seen by general surgery Dr Curvin May 16 - plan for removal of 2cm sebaceous cyst.    Methadone  maintenance therapy patient (HCC)    Mild asthma    followed by pcp   Nocturia    PAD (peripheral artery disease) (HCC)    followed by cardiology, dr darron---  06-01-2019  s/p bilateral CIA angioplasty stenting   Recurrent falls 04/30/2016   Right foot ulcer (HCC)    Symptomatic anemia 11/15/2020   Tendonitis    bilateral wrist   Type 2 diabetes mellitus (HCC)    followed by pcp   (06-16-2019  per pt checks blood sugar every other day in am,  fasting sugar 92-95)   Urge urinary incontinence    Past Surgical History:  Procedure Laterality Date   ABDOMINAL AORTOGRAM W/LOWER EXTREMITY Bilateral 06/01/2019   Procedure: ABDOMINAL AORTOGRAM W/LOWER EXTREMITY;  Surgeon: Darron Deatrice LABOR, MD;  Location: [facility];  Service: Cardiovascular;  Laterality: Bilateral;   ABDOMINAL AORTOGRAM W/LOWER EXTREMITY N/A 06/04/2022   Procedure: ABDOMINAL AORTOGRAM W/LOWER EXTREMITY;  Surgeon: Darron Deatrice LABOR, MD;  Location: [facility];  Service: Cardiovascular;  Laterality: N/A;   AMPUTATION TOE Right 03/23/2023   Procedure:  AMPUTATION TOE;  Surgeon: Malvin Marsa FALCON, DPM;  Location: [facility];  Service: Orthopedics/Podiatry;  Laterality: Right;  Right great toe amputation   APPENDECTOMY  1972   BREAST BIOPSY Right 09/24/2022   MM RT BREAST BX W LOC DEV 1ST LESION IMAGE BX SPEC STEREO GUIDE 09/24/2022 [facility]   INTERSTIM IMPLANT PLACEMENT  10/23/2011   Procedure: RENNA IMPLANT FIRST STAGE;  Surgeon: Glendia LABOR Elizabeth, MD;  Location: [facility];  Service: Urology;  Laterality: N/A;   INTERSTIM IMPLANT PLACEMENT  10/23/2011   Procedure: RENNA IMPLANT SECOND STAGE;  Surgeon: Glendia LABOR Elizabeth, MD;  Location: [facility];  Service: Urology;  Laterality: N/A;   LEFT HEART CATH AND CORONARY ANGIOGRAPHY N/A 01/11/2020   Procedure: LEFT HEART CATH AND CORONARY ANGIOGRAPHY;  Surgeon: Darron Deatrice LABOR, MD;  Location: [facility];  Service: Cardiovascular;  Laterality: N/A;   PANACOS TISSUE GRAFT Right 06/24/2019   Procedure: Application of Skin Graft Substitute;  Surgeon: Gretel Ozell PARAS, DPM;  Location: [facility];  Service: Podiatry;  Laterality: Right;   PERIPHERAL VASCULAR INTERVENTION Bilateral 06/01/2019   Procedure: PERIPHERAL VASCULAR INTERVENTION;  Surgeon: Darron Deatrice LABOR, MD;  Location: [facility];  Service: Cardiovascular;  Laterality: Bilateral;   WOUND DEBRIDEMENT Right 06/24/2019   Procedure: DEBRIDEMENT WOUND;  Surgeon: Gretel Ozell PARAS, DPM;  Location: [facility];  Service: Podiatry;  Laterality: Right;   WOUND DEBRIDEMENT Right 08/24/2019   Procedure: DEBRIDEMENT RIGHT FOOT WOUND; APPLICATION OF SKIN GRAFT SUBSTITUTE;  Surgeon: Gretel Ozell PARAS, DPM;  Location: [facility];  Service: Podiatry;  Laterality: Right;   Social History   Tobacco Use   Smoking status: Some Days    Current packs/day: 0.50    Average packs/day: 0.5 packs/day for 20.0 years (10.0 ttl pk-yrs)    Types: Cigarettes    Smokeless tobacco: Never   Tobacco comments:    SMOKES 4 CIGS PER DAY  Vaping Use   Vaping status: Never Used  Substance Use Topics   Alcohol use: No    Alcohol/week: 0.0 standard drinks of alcohol   Drug use: Not Currently    Comment: hx heroin use, IV,  last used 2006 (currently on methadone )   Social History   Socioeconomic History   Marital status: Single    Spouse name: Not on file   Number of children: Not on file   Years of education: Not on file   Highest education level: Not on file  Occupational History   Not on file  Tobacco Use   Smoking status: Some Days    Current packs/day: 0.50    Average packs/day: 0.5 packs/day for 20.0 years (10.0 ttl pk-yrs)    Types: Cigarettes   Smokeless tobacco: Never   Tobacco comments:    SMOKES 4 CIGS PER DAY  Vaping Use   Vaping status: Never Used  Substance and Sexual Activity  Alcohol use: No    Alcohol/week: 0.0 standard drinks of alcohol   Drug use: Not Currently    Comment: hx heroin use, IV,  last used 2006 (currently on methadone )   Sexual activity: Not Currently  Other Topics Concern   Not on file  Social History Narrative   Not on file   Social Drivers of Health   Financial Resource Strain: Low Risk  (09/23/2022)   Overall Financial Resource Strain (CARDIA)    Difficulty of Paying Living Expenses: Not hard at all  Food Insecurity: No Food Insecurity (03/26/2023)   Hunger Vital Sign    Worried About Running Out of Food in the Last Year: Never true    Ran Out of Food in the Last Year: Never true  Transportation Needs: No Transportation Needs (03/26/2023)   PRAPARE - Administrator, Civil Service (Medical): No    Lack of Transportation (Non-Medical): No  Physical Activity: Insufficiently Active (09/23/2022)   Exercise Vital Sign    Days of Exercise per Week: 2 days    Minutes of Exercise per Session: 20 min  Stress: No Stress Concern Present (09/23/2022)   [employer] of Occupational Health -  Occupational Stress Questionnaire    Feeling of Stress : Not at all  Social Connections: Moderately Isolated (09/23/2022)   Social Connection and Isolation Panel [NHANES]    Frequency of Communication with Friends and Family: Three times a week    Frequency of Social Gatherings with Friends and Family: Twice a week    Attends Religious Services: More than 4 times per year    Active Member of [employer] or Organizations: No    Attends Banker Meetings: Never    Marital Status: Never married  Intimate Partner Violence: Not At Risk (03/26/2023)   Humiliation, Afraid, Rape, and Kick questionnaire    Fear of Current or Ex-Partner: No    Emotionally Abused: No    Physically Abused: No    Sexually Abused: No   Family Status  Relation Name Status   Mother  Deceased       High Blood Pressure   Father  Deceased       Heart Attack   Brother  Alive       Diabetes, high blood pressure   Sister  Alive  No partnership data on file   Family History  Problem Relation Age of Onset   Hypertension Mother    Diabetes type II Mother    Heart attack Mother 92   CAD Father    Heart attack Father 45   Hypertension Brother    Diabetes type II Brother    Heart attack Brother 45   Diabetes type II Sister    Allergies  Allergen Reactions   Lisinopril  Swelling    Angioedema   Gabapentin  Hives and Other (See Comments)    Only when takes over 300 mg dose. gives me the shakes   Vicodin [Hydrocodone-Acetaminophen ] Hives, Diarrhea and Other (See Comments)    Stomach  cramps   Hydrocodone-Acetaminophen     ROS   ROS negative except for what is noted on the assessment and plan.   Objective:     BP (!) 152/66 (BP Location: Right Arm, Patient Position: Sitting, Cuff Size: Normal)   Pulse 84   Temp 97.8 F (36.6 C) (Oral)   Ht 5' 3 (1.6 m)   Wt 137 lb 3.2 oz (62.2 kg)   LMP 07/31/2011   SpO2 100%   BMI 24.30  kg/m  BP Readings from Last 3 Encounters:  04/09/23 (!) 152/66   03/27/23 (!) 113/55  03/25/23 117/71   Wt Readings from Last 3 Encounters:  04/09/23 137 lb 3.2 oz (62.2 kg)  03/27/23 135 lb (61.2 kg)  03/22/23 135 lb (61.2 kg)   SpO2 Readings from Last 3 Encounters:  04/09/23 100%  03/27/23 96%  03/25/23 100%      Physical Exam  General: Sitting in chair, no acute distress  Cardio: Regular rate and rhythm, no murmurs, rubs or gallops.   Pulmonary: Clear to ausculation bilaterally with no rales, rhonchi, and crackles  Abdomen: Soft, nontender with normoactive bowel sounds with no rebound or guarding  MSK: +R foot wrapped and dressed with surgical shoe  No results found for any visits on 04/09/23.  Last CBC Lab Results  Component Value Date   WBC 9.1 03/27/2023   HGB 8.6 (L) 03/27/2023   HCT 28.5 (L) 03/27/2023   MCV 80.1 03/27/2023   MCH 24.2 (L) 03/27/2023   RDW 14.6 03/27/2023   PLT 291 03/27/2023   Last metabolic panel Lab Results  Component Value Date   GLUCOSE 69 (L) 03/27/2023   NA 136 03/27/2023   K 3.9 03/27/2023   CL 103 03/27/2023   CO2 22 03/27/2023   BUN 19 03/27/2023   CREATININE 1.13 (H) 03/27/2023   GFRNONAA 54 (L) 03/27/2023   CALCIUM  8.7 (L) 03/27/2023   PHOS 3.0 11/04/2014   PROT 7.5 03/22/2023   ALBUMIN 3.2 (L) 03/22/2023   LABGLOB 2.9 10/26/2017   AGRATIO 1.4 10/26/2017   BILITOT 0.5 03/22/2023   ALKPHOS 63 03/22/2023   AST 15 03/22/2023   ALT 10 03/22/2023   ANIONGAP 11 03/27/2023   Last lipids Lab Results  Component Value Date   CHOL 98 (L) 12/10/2021   HDL 29 (L) 12/10/2021   LDLCALC 42 12/10/2021   TRIG 160 (H) 12/10/2021   CHOLHDL 3.4 12/10/2021   Last hemoglobin A1c Lab Results  Component Value Date   HGBA1C 6.7 (A) 02/17/2023   Last thyroid  functions Lab Results  Component Value Date   TSH 4.270 09/15/2019   Last vitamin D  Lab Results  Component Value Date   VD25OH 34.5 03/11/2018   Last vitamin B12 and Folate Lab Results  Component Value Date   VITAMINB12 599  03/24/2023   FOLATE >23.4 12/26/2020      The ASCVD Risk score (Arnett DK, et al., 2019) failed to calculate for the following reasons:   The valid total cholesterol range is 130 to 320 mg/dL    Assessment & Plan:  Patient is discussed with Dr. Forest  Problem List Items Addressed This Visit       Cardiovascular and Mediastinum   Essential hypertension   BP Readings from Last 3 Encounters:  04/09/23 (!) 152/66  03/27/23 (!) 113/55  03/25/23 117/71   Patient is currently taking carvedilol  6.25 mg twice daily, states that usually her blood pressures run low.  She denies any symptoms of chest pain or shortness of breath.  Will not make any acute changes at this time.  Patient is advised to check her blood pressures at home.  If it still elevated, she should call for reevaluation.  Plan: -Continue carvedilol  6.25 mg twice daily.      PAD s/p PCI with stent   Patient reports she is taking Plavix  75 mg daily and Lipitor  80 mg daily.  No concerns at this time.  Plan: -Continue taking Plavix  and Lipitor  daily  Endocrine   Type 2 diabetes mellitus with complication, without long-term current use of insulin  [facility])   Lab Results  Component Value Date   HGBA1C 6.7 (A) 02/17/2023   Patient reports that she takes Synjardy  daily.  No other concerns at this time.        Nervous and Auditory   Bilateral peripheral neuropathy primary involving distal fingers   Lab Results  Component Value Date   VITAMINB12 599 03/24/2023   Patient reports history of arthritis, tendinitis and carpal tunnel syndrome of bilateral wrist.  Patient states that she is having this burning sensation on the tips of her bilateral fingers, it is worse at night, wakes her up from sleep.  Patient reports that she has a history of carpal tunnel syndrome of her bilateral wrist, she sometimes have trouble holding objects.  She currently has wrist splints on.  States that she was previously on gabapentin  300  mg however that gave her shakes.  States that she is currently taking Cymbalta  60 mg that is helping her little.  Patient reports that she wants to restart her gabapentin  but at a low dose to manage the burning sensation that she has at her fingertips.  Plan: -Start gabapentin  100 mg at night -Patient is advised that if she has any side effects, that she should stop -Continue Cymbalta  60 mg daily      Relevant Medications   gabapentin  (NEURONTIN ) 100 MG capsule     Musculoskeletal and Integument   Osteomyelitis of great toe of right foot (HCC) - Primary   This is a 68 year old female who presents to the clinic today for hospital follow-up.  Patient recently admitted on 03/22/2023 for osteomyelitis of great toe of right foot, status post amputation of right hallux at MPJ level, she was discharged on 03/25/2023 to complete 7-day course of doxycycline  and Augmentin .  Patient reports that she has completed her antibiotic course.  She has followed up with podiatrist on 04/03/2023, who dressed the surgical site and recommended weight-bear as patient can tolerate in surgical shoe.  They had also noted no signs of infection during the visit. They recommended the patient to follow-up with Dr. Malvin on 04/08/2022 [today] at 1:15 PM for re-dressing and evaluation. She was sent home with  percocet 5-325 mg q4 #16 tablets for pain management.   Surgical path and culture from 12/23 showed Gangrenous ulcer with underlying marked acute and gangrenous cellulitis with marked acute osteomyelitis Proximal skin and soft tissue margin grossly viable Proximal bone margin negative for osteomyelitis.  Presently, She reports minimal pain at the Right foot. She is able to ambulate with minimal weight bearing. She is using a cane to ambulate.   Plan: - Continue following up with the podiatrist today  - Weight bear as patient tolerates         Genitourinary   AKI (acute kidney injury) (HCC)   Lab Results  Component  Value Date   CREATININE 1.13 (H) 03/27/2023   Patient was admitted with a creatinine 1.86, recheck post discharge creatinine 1.13, back to baseline.  Resolved.        Other   Methadone  maintenance therapy patient [facility])   Patient is currently on methadone  150 mg daily.  States that she follows the methadone  clinic once a week.       Return in about 1 month (around 05/10/2023).    Toma Edwards, DO

## 2023-04-09 NOTE — Patient Instructions (Addendum)
 Thank you, [name] for allowing us  to provide your care today. Today we discussed:   Continue following with the podiatrist.  Continue taking your medication as prescribed.  I sent you a prescription for gabapentin  100 mg, take 1 tablet at night.  I have ordered the following labs for you:  Lab Orders  No laboratory test(s) ordered today     Tests ordered today:  None  Referrals ordered today:   Referral Orders  No referral(s) requested today     I have ordered the following medication/changed the following medications:   Stop the following medications: There are no discontinued medications.   Start the following medications: Meds ordered this encounter  Medications   gabapentin  (NEURONTIN ) 100 MG capsule    Sig: Take 1 capsule (100 mg total) by mouth daily.    Dispense:  90 capsule    Refill:  0     Follow up: 4-6 months as needed   Remember:   Should you have any questions or concerns please call the internal medicine clinic at [phone].     Toma Edwards, DO [facility]

## 2023-04-09 NOTE — Assessment & Plan Note (Signed)
 BP Readings from Last 3 Encounters:  04/09/23 (!) 152/66  03/27/23 (!) 113/55  03/25/23 117/71   Patient is currently taking carvedilol  6.25 mg twice daily, states that usually her blood pressures run low.  She denies any symptoms of chest pain or shortness of breath.  Will not make any acute changes at this time.  Patient is advised to check her blood pressures at home.  If it still elevated, she should call for reevaluation.  Plan: -Continue carvedilol  6.25 mg twice daily.

## 2023-04-09 NOTE — Assessment & Plan Note (Signed)
 Lab Results  Component Value Date   VITAMINB12 599 03/24/2023   Patient reports history of arthritis, tendinitis and carpal tunnel syndrome of bilateral wrist.  Patient states that she is having this burning sensation on the tips of her bilateral fingers, it is worse at night, wakes her up from sleep.  Patient reports that she has a history of carpal tunnel syndrome of her bilateral wrist, she sometimes have trouble holding objects.  She currently has wrist splints on.  States that she was previously on gabapentin  300 mg however that gave her shakes.  States that she is currently taking Cymbalta  60 mg that is helping her little.  Patient reports that she wants to restart her gabapentin  but at a low dose to manage the burning sensation that she has at her fingertips.  Plan: -Start gabapentin  100 mg at night -Patient is advised that if she has any side effects, that she should stop -Continue Cymbalta  60 mg daily

## 2023-04-09 NOTE — Assessment & Plan Note (Signed)
 This is a 68 year old female who presents to the clinic today for hospital follow-up.  Patient recently admitted on 03/22/2023 for osteomyelitis of great toe of right foot, status post amputation of right hallux at MPJ level, she was discharged on 03/25/2023 to complete 7-day course of doxycycline  and Augmentin .  Patient reports that she has completed her antibiotic course.  She has followed up with podiatrist on 04/03/2023, who dressed the surgical site and recommended weight-bear as patient can tolerate in surgical shoe.  They had also noted no signs of infection during the visit. They recommended the patient to follow-up with Dr. Malvin on 04/08/2022 [today] at 1:15 PM for re-dressing and evaluation. She was sent home with  percocet 5-325 mg q4 #16 tablets for pain management.   Surgical path and culture from 12/23 showed Gangrenous ulcer with underlying marked acute and gangrenous cellulitis with marked acute osteomyelitis Proximal skin and soft tissue margin grossly viable Proximal bone margin negative for osteomyelitis.  Presently, She reports minimal pain at the Right foot. She is able to ambulate with minimal weight bearing. She is using a cane to ambulate.   Plan: - Continue following up with the podiatrist today  - Weight bear as patient tolerates

## 2023-04-09 NOTE — Assessment & Plan Note (Signed)
 Lab Results  Component Value Date   HGBA1C 6.7 (A) 02/17/2023   Patient reports that she takes [place] daily.  No other concerns at this time.

## 2023-04-09 NOTE — Assessment & Plan Note (Signed)
 Lab Results  Component Value Date   CREATININE 1.13 (H) 03/27/2023   Patient was admitted with a creatinine 1.86, recheck post discharge creatinine 1.13, back to baseline.  Resolved.

## 2023-04-09 NOTE — Assessment & Plan Note (Signed)
 Patient reports she is taking Plavix 75 mg daily and Lipitor 80 mg daily.  No concerns at this time.  Plan: -Continue taking Plavix and Lipitor daily

## 2023-04-09 NOTE — Assessment & Plan Note (Signed)
 Patient is currently on methadone 150 mg daily.  States that she follows the methadone clinic once a week.

## 2023-04-10 NOTE — Progress Notes (Signed)
 Internal Medicine Clinic Attending  Case discussed with the resident at the time of the visit.  We reviewed the resident's history and exam and pertinent patient test results.  I agree with the assessment, diagnosis, and plan of care documented in the resident's note.

## 2023-04-14 DIAGNOSIS — I119 Hypertensive heart disease without heart failure: Secondary | ICD-10-CM | POA: Diagnosis not present

## 2023-04-14 DIAGNOSIS — M86171 Other acute osteomyelitis, right ankle and foot: Secondary | ICD-10-CM | POA: Diagnosis not present

## 2023-04-14 DIAGNOSIS — N1831 Chronic kidney disease, stage 3a: Secondary | ICD-10-CM | POA: Diagnosis not present

## 2023-04-14 DIAGNOSIS — L03115 Cellulitis of right lower limb: Secondary | ICD-10-CM | POA: Diagnosis not present

## 2023-04-14 DIAGNOSIS — I739 Peripheral vascular disease, unspecified: Secondary | ICD-10-CM | POA: Diagnosis not present

## 2023-04-14 DIAGNOSIS — E1122 Type 2 diabetes mellitus with diabetic chronic kidney disease: Secondary | ICD-10-CM | POA: Diagnosis not present

## 2023-04-14 DIAGNOSIS — Z7984 Long term (current) use of oral hypoglycemic drugs: Secondary | ICD-10-CM | POA: Diagnosis not present

## 2023-04-14 DIAGNOSIS — I2510 Atherosclerotic heart disease of native coronary artery without angina pectoris: Secondary | ICD-10-CM | POA: Diagnosis not present

## 2023-04-15 DIAGNOSIS — N1831 Chronic kidney disease, stage 3a: Secondary | ICD-10-CM | POA: Diagnosis not present

## 2023-04-15 DIAGNOSIS — L03115 Cellulitis of right lower limb: Secondary | ICD-10-CM | POA: Diagnosis not present

## 2023-04-15 DIAGNOSIS — I119 Hypertensive heart disease without heart failure: Secondary | ICD-10-CM | POA: Diagnosis not present

## 2023-04-15 DIAGNOSIS — E1122 Type 2 diabetes mellitus with diabetic chronic kidney disease: Secondary | ICD-10-CM | POA: Diagnosis not present

## 2023-04-15 DIAGNOSIS — Z7984 Long term (current) use of oral hypoglycemic drugs: Secondary | ICD-10-CM | POA: Diagnosis not present

## 2023-04-15 DIAGNOSIS — I2510 Atherosclerotic heart disease of native coronary artery without angina pectoris: Secondary | ICD-10-CM | POA: Diagnosis not present

## 2023-04-15 DIAGNOSIS — I739 Peripheral vascular disease, unspecified: Secondary | ICD-10-CM | POA: Diagnosis not present

## 2023-04-15 DIAGNOSIS — M86171 Other acute osteomyelitis, right ankle and foot: Secondary | ICD-10-CM | POA: Diagnosis not present

## 2023-04-16 DIAGNOSIS — I2510 Atherosclerotic heart disease of native coronary artery without angina pectoris: Secondary | ICD-10-CM | POA: Diagnosis not present

## 2023-04-16 DIAGNOSIS — E1122 Type 2 diabetes mellitus with diabetic chronic kidney disease: Secondary | ICD-10-CM | POA: Diagnosis not present

## 2023-04-16 DIAGNOSIS — I739 Peripheral vascular disease, unspecified: Secondary | ICD-10-CM | POA: Diagnosis not present

## 2023-04-16 DIAGNOSIS — M86171 Other acute osteomyelitis, right ankle and foot: Secondary | ICD-10-CM | POA: Diagnosis not present

## 2023-04-16 DIAGNOSIS — Z7984 Long term (current) use of oral hypoglycemic drugs: Secondary | ICD-10-CM | POA: Diagnosis not present

## 2023-04-16 DIAGNOSIS — I119 Hypertensive heart disease without heart failure: Secondary | ICD-10-CM | POA: Diagnosis not present

## 2023-04-16 DIAGNOSIS — N1831 Chronic kidney disease, stage 3a: Secondary | ICD-10-CM | POA: Diagnosis not present

## 2023-04-16 DIAGNOSIS — L03115 Cellulitis of right lower limb: Secondary | ICD-10-CM | POA: Diagnosis not present

## 2023-04-21 ENCOUNTER — Other Ambulatory Visit: Payer: Self-pay | Admitting: Student

## 2023-04-21 DIAGNOSIS — E1142 Type 2 diabetes mellitus with diabetic polyneuropathy: Secondary | ICD-10-CM

## 2023-04-21 DIAGNOSIS — G8929 Other chronic pain: Secondary | ICD-10-CM

## 2023-04-21 DIAGNOSIS — I119 Hypertensive heart disease without heart failure: Secondary | ICD-10-CM | POA: Diagnosis not present

## 2023-04-21 DIAGNOSIS — N1831 Chronic kidney disease, stage 3a: Secondary | ICD-10-CM | POA: Diagnosis not present

## 2023-04-21 DIAGNOSIS — Z7984 Long term (current) use of oral hypoglycemic drugs: Secondary | ICD-10-CM | POA: Diagnosis not present

## 2023-04-21 DIAGNOSIS — I2510 Atherosclerotic heart disease of native coronary artery without angina pectoris: Secondary | ICD-10-CM | POA: Diagnosis not present

## 2023-04-21 DIAGNOSIS — L03115 Cellulitis of right lower limb: Secondary | ICD-10-CM | POA: Diagnosis not present

## 2023-04-21 DIAGNOSIS — E1122 Type 2 diabetes mellitus with diabetic chronic kidney disease: Secondary | ICD-10-CM | POA: Diagnosis not present

## 2023-04-21 DIAGNOSIS — M86171 Other acute osteomyelitis, right ankle and foot: Secondary | ICD-10-CM | POA: Diagnosis not present

## 2023-04-21 DIAGNOSIS — I739 Peripheral vascular disease, unspecified: Secondary | ICD-10-CM | POA: Diagnosis not present

## 2023-04-22 DIAGNOSIS — L03115 Cellulitis of right lower limb: Secondary | ICD-10-CM | POA: Diagnosis not present

## 2023-04-22 DIAGNOSIS — Z7984 Long term (current) use of oral hypoglycemic drugs: Secondary | ICD-10-CM | POA: Diagnosis not present

## 2023-04-22 DIAGNOSIS — I739 Peripheral vascular disease, unspecified: Secondary | ICD-10-CM | POA: Diagnosis not present

## 2023-04-22 DIAGNOSIS — E1122 Type 2 diabetes mellitus with diabetic chronic kidney disease: Secondary | ICD-10-CM | POA: Diagnosis not present

## 2023-04-22 DIAGNOSIS — N1831 Chronic kidney disease, stage 3a: Secondary | ICD-10-CM | POA: Diagnosis not present

## 2023-04-22 DIAGNOSIS — I119 Hypertensive heart disease without heart failure: Secondary | ICD-10-CM | POA: Diagnosis not present

## 2023-04-22 DIAGNOSIS — M86171 Other acute osteomyelitis, right ankle and foot: Secondary | ICD-10-CM | POA: Diagnosis not present

## 2023-04-22 DIAGNOSIS — I2510 Atherosclerotic heart disease of native coronary artery without angina pectoris: Secondary | ICD-10-CM | POA: Diagnosis not present

## 2023-04-22 LAB — FUNGUS CULTURE WITH STAIN

## 2023-04-22 LAB — FUNGAL ORGANISM REFLEX

## 2023-04-22 LAB — FUNGUS CULTURE RESULT

## 2023-04-23 ENCOUNTER — Telehealth: Payer: Self-pay | Admitting: *Deleted

## 2023-04-23 DIAGNOSIS — M86171 Other acute osteomyelitis, right ankle and foot: Secondary | ICD-10-CM | POA: Diagnosis not present

## 2023-04-23 DIAGNOSIS — Z7984 Long term (current) use of oral hypoglycemic drugs: Secondary | ICD-10-CM | POA: Diagnosis not present

## 2023-04-23 DIAGNOSIS — L03115 Cellulitis of right lower limb: Secondary | ICD-10-CM | POA: Diagnosis not present

## 2023-04-23 DIAGNOSIS — I2510 Atherosclerotic heart disease of native coronary artery without angina pectoris: Secondary | ICD-10-CM | POA: Diagnosis not present

## 2023-04-23 DIAGNOSIS — N1831 Chronic kidney disease, stage 3a: Secondary | ICD-10-CM | POA: Diagnosis not present

## 2023-04-23 DIAGNOSIS — I739 Peripheral vascular disease, unspecified: Secondary | ICD-10-CM | POA: Diagnosis not present

## 2023-04-23 DIAGNOSIS — E1122 Type 2 diabetes mellitus with diabetic chronic kidney disease: Secondary | ICD-10-CM | POA: Diagnosis not present

## 2023-04-23 DIAGNOSIS — I119 Hypertensive heart disease without heart failure: Secondary | ICD-10-CM | POA: Diagnosis not present

## 2023-04-23 NOTE — Telephone Encounter (Signed)
Received a call from [facility] with [facility] requesting verbal order for "Nursing evaluation to assess wound on right foot". PT stated wound does not look bad but does not want it get any worse. VO given - send to [employer] for approval or denial. Thanks

## 2023-04-27 DIAGNOSIS — I2510 Atherosclerotic heart disease of native coronary artery without angina pectoris: Secondary | ICD-10-CM | POA: Diagnosis not present

## 2023-04-27 DIAGNOSIS — I119 Hypertensive heart disease without heart failure: Secondary | ICD-10-CM | POA: Diagnosis not present

## 2023-04-27 DIAGNOSIS — L03115 Cellulitis of right lower limb: Secondary | ICD-10-CM | POA: Diagnosis not present

## 2023-04-27 DIAGNOSIS — I739 Peripheral vascular disease, unspecified: Secondary | ICD-10-CM | POA: Diagnosis not present

## 2023-04-27 DIAGNOSIS — Z7984 Long term (current) use of oral hypoglycemic drugs: Secondary | ICD-10-CM | POA: Diagnosis not present

## 2023-04-27 DIAGNOSIS — N1831 Chronic kidney disease, stage 3a: Secondary | ICD-10-CM | POA: Diagnosis not present

## 2023-04-27 DIAGNOSIS — M86171 Other acute osteomyelitis, right ankle and foot: Secondary | ICD-10-CM | POA: Diagnosis not present

## 2023-04-27 DIAGNOSIS — E1122 Type 2 diabetes mellitus with diabetic chronic kidney disease: Secondary | ICD-10-CM | POA: Diagnosis not present

## 2023-04-28 DIAGNOSIS — I739 Peripheral vascular disease, unspecified: Secondary | ICD-10-CM | POA: Diagnosis not present

## 2023-04-28 DIAGNOSIS — I2510 Atherosclerotic heart disease of native coronary artery without angina pectoris: Secondary | ICD-10-CM | POA: Diagnosis not present

## 2023-04-28 DIAGNOSIS — M86171 Other acute osteomyelitis, right ankle and foot: Secondary | ICD-10-CM | POA: Diagnosis not present

## 2023-04-28 DIAGNOSIS — I119 Hypertensive heart disease without heart failure: Secondary | ICD-10-CM | POA: Diagnosis not present

## 2023-04-28 DIAGNOSIS — N1831 Chronic kidney disease, stage 3a: Secondary | ICD-10-CM | POA: Diagnosis not present

## 2023-04-28 DIAGNOSIS — L03115 Cellulitis of right lower limb: Secondary | ICD-10-CM | POA: Diagnosis not present

## 2023-04-28 DIAGNOSIS — Z7984 Long term (current) use of oral hypoglycemic drugs: Secondary | ICD-10-CM | POA: Diagnosis not present

## 2023-04-28 DIAGNOSIS — E1122 Type 2 diabetes mellitus with diabetic chronic kidney disease: Secondary | ICD-10-CM | POA: Diagnosis not present

## 2023-04-30 ENCOUNTER — Telehealth: Payer: Self-pay | Admitting: *Deleted

## 2023-04-30 DIAGNOSIS — M86171 Other acute osteomyelitis, right ankle and foot: Secondary | ICD-10-CM | POA: Diagnosis not present

## 2023-04-30 DIAGNOSIS — L03115 Cellulitis of right lower limb: Secondary | ICD-10-CM | POA: Diagnosis not present

## 2023-04-30 DIAGNOSIS — E1122 Type 2 diabetes mellitus with diabetic chronic kidney disease: Secondary | ICD-10-CM | POA: Diagnosis not present

## 2023-04-30 DIAGNOSIS — Z7984 Long term (current) use of oral hypoglycemic drugs: Secondary | ICD-10-CM | POA: Diagnosis not present

## 2023-04-30 DIAGNOSIS — I119 Hypertensive heart disease without heart failure: Secondary | ICD-10-CM | POA: Diagnosis not present

## 2023-04-30 DIAGNOSIS — I2510 Atherosclerotic heart disease of native coronary artery without angina pectoris: Secondary | ICD-10-CM | POA: Diagnosis not present

## 2023-04-30 DIAGNOSIS — N1831 Chronic kidney disease, stage 3a: Secondary | ICD-10-CM | POA: Diagnosis not present

## 2023-04-30 DIAGNOSIS — I739 Peripheral vascular disease, unspecified: Secondary | ICD-10-CM | POA: Diagnosis not present

## 2023-04-30 NOTE — Telephone Encounter (Signed)
Call from [facility] requesting Verbal Order to continue PT.  Continue 1 time a week for 3 weeks. To work on Actor to decrease Fall Risk. Verbal Approval was given will forward to PCP for approval or denial.

## 2023-05-04 DIAGNOSIS — T8189XA Other complications of procedures, not elsewhere classified, initial encounter: Secondary | ICD-10-CM | POA: Diagnosis not present

## 2023-05-05 NOTE — Telephone Encounter (Signed)
Verbal order was given approval and Dr. Mickie Bail has agreed.   Forms to be sent for his signature.

## 2023-05-06 ENCOUNTER — Telehealth: Payer: Self-pay | Admitting: *Deleted

## 2023-05-06 DIAGNOSIS — I739 Peripheral vascular disease, unspecified: Secondary | ICD-10-CM | POA: Diagnosis not present

## 2023-05-06 DIAGNOSIS — Z7984 Long term (current) use of oral hypoglycemic drugs: Secondary | ICD-10-CM | POA: Diagnosis not present

## 2023-05-06 DIAGNOSIS — M86171 Other acute osteomyelitis, right ankle and foot: Secondary | ICD-10-CM | POA: Diagnosis not present

## 2023-05-06 DIAGNOSIS — I2510 Atherosclerotic heart disease of native coronary artery without angina pectoris: Secondary | ICD-10-CM | POA: Diagnosis not present

## 2023-05-06 DIAGNOSIS — L03115 Cellulitis of right lower limb: Secondary | ICD-10-CM | POA: Diagnosis not present

## 2023-05-06 DIAGNOSIS — N1831 Chronic kidney disease, stage 3a: Secondary | ICD-10-CM | POA: Diagnosis not present

## 2023-05-06 DIAGNOSIS — E1122 Type 2 diabetes mellitus with diabetic chronic kidney disease: Secondary | ICD-10-CM | POA: Diagnosis not present

## 2023-05-06 DIAGNOSIS — I119 Hypertensive heart disease without heart failure: Secondary | ICD-10-CM | POA: Diagnosis not present

## 2023-05-06 NOTE — Telephone Encounter (Signed)
 Call from [facility] just needed to notify patient's doctor that she fell 2 days ago and also this morning.  No apparent injuries presently.  Has balance problems since having toe amputation.  Needs to use Can more to assist in balancing.

## 2023-05-12 DIAGNOSIS — I739 Peripheral vascular disease, unspecified: Secondary | ICD-10-CM | POA: Diagnosis not present

## 2023-05-12 DIAGNOSIS — I119 Hypertensive heart disease without heart failure: Secondary | ICD-10-CM | POA: Diagnosis not present

## 2023-05-12 DIAGNOSIS — Z7984 Long term (current) use of oral hypoglycemic drugs: Secondary | ICD-10-CM | POA: Diagnosis not present

## 2023-05-12 DIAGNOSIS — E1122 Type 2 diabetes mellitus with diabetic chronic kidney disease: Secondary | ICD-10-CM | POA: Diagnosis not present

## 2023-05-12 DIAGNOSIS — M86171 Other acute osteomyelitis, right ankle and foot: Secondary | ICD-10-CM | POA: Diagnosis not present

## 2023-05-12 DIAGNOSIS — L03115 Cellulitis of right lower limb: Secondary | ICD-10-CM | POA: Diagnosis not present

## 2023-05-12 DIAGNOSIS — N1831 Chronic kidney disease, stage 3a: Secondary | ICD-10-CM | POA: Diagnosis not present

## 2023-05-12 DIAGNOSIS — I2510 Atherosclerotic heart disease of native coronary artery without angina pectoris: Secondary | ICD-10-CM | POA: Diagnosis not present

## 2023-05-14 ENCOUNTER — Encounter: Payer: Self-pay | Admitting: Podiatry

## 2023-05-14 ENCOUNTER — Ambulatory Visit (INDEPENDENT_AMBULATORY_CARE_PROVIDER_SITE_OTHER): Payer: 59 | Admitting: Podiatry

## 2023-05-14 ENCOUNTER — Ambulatory Visit (INDEPENDENT_AMBULATORY_CARE_PROVIDER_SITE_OTHER): Payer: 59

## 2023-05-14 DIAGNOSIS — Z89419 Acquired absence of unspecified great toe: Secondary | ICD-10-CM | POA: Diagnosis not present

## 2023-05-14 DIAGNOSIS — T8130XA Disruption of wound, unspecified, initial encounter: Secondary | ICD-10-CM | POA: Diagnosis not present

## 2023-05-14 DIAGNOSIS — E1151 Type 2 diabetes mellitus with diabetic peripheral angiopathy without gangrene: Secondary | ICD-10-CM

## 2023-05-14 NOTE — Progress Notes (Signed)
Date of surgery: 03/23/2023 Procedure: Right first toe amputation with a level of the MPJ with Dr. Annamary Rummage  HPI: 68 y.o. female presents today for evaluation of surgical site. Has had home health who is concerned appearance of part of the incision. Denies pain. Denies any signs of infection today.  Past Medical History:  Diagnosis Date   ACE inhibitor-aggravated angioedema, initial encounter 04/26/2016   Allergic rhinitis    Anxiety    Arthritis    Calculus of gallbladder without cholecystitis without obstruction 10/28/2018   Chronic back pain    CKD (chronic kidney disease), stage III (HCC)    folllowed by pcp   Coronary artery disease    Depression    Diabetic neuropathy (HCC)    Edema of lower extremity 10/28/2018   Encounter for completion of form with patient 06/21/2019   Environmental allergies    Environmental allergies 12/28/2015   Fibromyalgia    Foot ulcer secondary to diabetes 06/21/2019   Frequency of urination    History of chronic bronchitis    History of drug abuse (HCC) states quit herion in 2006   History of hepatitis C last abd ultrasound in epic 11-10-2018;  last hepative panel in epic 11-30-2018 was normal   previously followed by [facility] (notes in care everywhere)-- dx 04/ 2015,  started harvonic treatment 07/ 2015   History of rib fracture 10/28/2018   HPV in female 06/11/2016   Hyperlipidemia associated with type 2 diabetes mellitus (HCC) 12/13/2015   Hypertension    followed by pcp  (nuclear stress test 01-15-2017 in epic,  showed low risk normal w/ nuclear ef 55%)   Laceration of right foot 03/04/2019   Lipoma of neck 07/16/2017   Seen by general surgery Dr Carolynne Edouard May 16 - plan for removal of 2cm sebaceous cyst.    Methadone maintenance therapy patient (HCC)    Mild asthma    followed by pcp   Nocturia    PAD (peripheral artery disease) (HCC)    followed by cardiology, dr Kirke Corin---  06-01-2019  s/p bilateral CIA  angioplasty stenting   Recurrent falls 04/30/2016   Right foot ulcer (HCC)    Symptomatic anemia 11/15/2020   Tendonitis    bilateral wrist   Type 2 diabetes mellitus (HCC)    followed by pcp   (06-16-2019  per pt checks blood sugar every other day in am,  fasting sugar 92-95)   Urge urinary incontinence     Past Surgical History:  Procedure Laterality Date   ABDOMINAL AORTOGRAM W/LOWER EXTREMITY Bilateral 06/01/2019   Procedure: ABDOMINAL AORTOGRAM W/LOWER EXTREMITY;  Surgeon: [name], MD;  Location: [facility];  Service: Cardiovascular;  Laterality: Bilateral;   ABDOMINAL AORTOGRAM W/LOWER EXTREMITY N/A 06/04/2022   Procedure: ABDOMINAL AORTOGRAM W/LOWER EXTREMITY;  Surgeon: [name], MD;  Location: [facility];  Service: Cardiovascular;  Laterality: N/A;   AMPUTATION TOE Right 03/23/2023   Procedure: AMPUTATION TOE;  Surgeon: Pilar Plate, DPM;  Location: [facility];  Service: Orthopedics/Podiatry;  Laterality: Right;  Right great toe amputation   APPENDECTOMY  1972   BREAST BIOPSY Right 09/24/2022   MM RT BREAST BX W LOC DEV 1ST LESION IMAGE BX SPEC STEREO GUIDE 09/24/2022 [facility]   INTERSTIM IMPLANT PLACEMENT  10/23/2011   Procedure: Leane Platt IMPLANT FIRST STAGE;  Surgeon: Martina Sinner, MD;  Location: [facility];  Service: Urology;  Laterality: N/A;   Leane Platt  IMPLANT PLACEMENT  10/23/2011   Procedure: Leane Platt IMPLANT SECOND STAGE;  Surgeon: Martina Sinner, MD;  Location: [facility];  Service: Urology;  Laterality: N/A;   LEFT HEART CATH AND CORONARY ANGIOGRAPHY N/A 01/11/2020   Procedure: LEFT HEART CATH AND CORONARY ANGIOGRAPHY;  Surgeon: [name], MD;  Location: [facility];  Service: Cardiovascular;  Laterality: N/A;   PANACOS TISSUE GRAFT Right 06/24/2019   Procedure: Application of Skin Graft Substitute;  Surgeon: Park Liter, DPM;  Location: [facility];   Service: Podiatry;  Laterality: Right;   PERIPHERAL VASCULAR INTERVENTION Bilateral 06/01/2019   Procedure: PERIPHERAL VASCULAR INTERVENTION;  Surgeon: [name], MD;  Location: [facility];  Service: Cardiovascular;  Laterality: Bilateral;   WOUND DEBRIDEMENT Right 06/24/2019   Procedure: DEBRIDEMENT WOUND;  Surgeon: Park Liter, DPM;  Location: [facility];  Service: Podiatry;  Laterality: Right;   WOUND DEBRIDEMENT Right 08/24/2019   Procedure: DEBRIDEMENT RIGHT FOOT WOUND; APPLICATION OF SKIN GRAFT SUBSTITUTE;  Surgeon: Park Liter, DPM;  Location: [facility];  Service: Podiatry;  Laterality: Right;    Allergies  Allergen Reactions   Lisinopril Swelling    Angioedema   Gabapentin Hives and Other (See Comments)    Only when takes over 300 mg dose. "gives me the shakes"   Vicodin [Hydrocodone-Acetaminophen] Hives, Diarrhea and Other (See Comments)    Stomach  cramps   Hydrocodone-Acetaminophen     Physical Exam: There were no vitals filed for this visit.  General: The patient is alert and oriented x3 in no acute distress.  Dermatology: Area of superficial dehiscence lateral aspect of the amputation site 0.3 mm in diameter.  Appears stable.  Limited to subcutaneous tissue.  No pain on palpation.  No focal warmth increased.  No drainage noted. Some edema to the adjacent second toe without redness or tenderness.   Vascular: Foot appears warm and well-perfused.  Cap refill intact to the remaining digits 3-5 seconds.  Pedal pulses nonpalpable.  Pedal hair growth absent.  Neurological: Light touch sensation grossly intact bilateral feet.   Musculoskeletal Exam: Status post right hallux amputation  Radiographic Exam: Right foot 3 views 05/14/2023 Status post amputation of first toe with disarticulation at the first metatarsal phalangeal joint.  Normal osseous mineralization.  No soft tissue emphysema.  Assessment/Plan of Care: 1.  Wound dehiscence   2. History of amputation of great toe (HCC)   3. Type II diabetes mellitus with peripheral circulatory disorder (HCC)       No orders of the defined types were placed in this encounter.  AMB REFERRAL TO CARDIOLOGY  Discussed clinical findings with patient today.  Plan: -Radiographs reviewed with patient -Betadine and a bandage applied to the site of superficial dehiscence -Change dressing daily -Stable appearing overall at this point in time.  Discussed signs and symptoms of worsening infection with patient to contact office or seek medical attention if these occur. -Referall placed to Dr. Eldridge Dace interventional cardiologist for reevaluation due to slow healing amputation site.  He is previously performed intervention for PAD for this patient. -Follow-up in 2 weeks for wound care.

## 2023-05-21 ENCOUNTER — Encounter: Payer: Medicaid Other | Admitting: Student

## 2023-05-22 ENCOUNTER — Other Ambulatory Visit: Payer: Self-pay | Admitting: Internal Medicine

## 2023-05-22 DIAGNOSIS — I119 Hypertensive heart disease without heart failure: Secondary | ICD-10-CM | POA: Diagnosis not present

## 2023-05-22 DIAGNOSIS — Z7984 Long term (current) use of oral hypoglycemic drugs: Secondary | ICD-10-CM | POA: Diagnosis not present

## 2023-05-22 DIAGNOSIS — N1831 Chronic kidney disease, stage 3a: Secondary | ICD-10-CM | POA: Diagnosis not present

## 2023-05-22 DIAGNOSIS — I2510 Atherosclerotic heart disease of native coronary artery without angina pectoris: Secondary | ICD-10-CM | POA: Diagnosis not present

## 2023-05-22 DIAGNOSIS — M86171 Other acute osteomyelitis, right ankle and foot: Secondary | ICD-10-CM | POA: Diagnosis not present

## 2023-05-22 DIAGNOSIS — E1122 Type 2 diabetes mellitus with diabetic chronic kidney disease: Secondary | ICD-10-CM | POA: Diagnosis not present

## 2023-05-22 DIAGNOSIS — I739 Peripheral vascular disease, unspecified: Secondary | ICD-10-CM | POA: Diagnosis not present

## 2023-05-22 DIAGNOSIS — L03115 Cellulitis of right lower limb: Secondary | ICD-10-CM | POA: Diagnosis not present

## 2023-05-27 ENCOUNTER — Ambulatory Visit: Payer: 59 | Admitting: Student

## 2023-05-27 VITALS — BP 130/60 | HR 70 | Temp 98.2°F | Wt 135.5 lb

## 2023-05-27 DIAGNOSIS — E1140 Type 2 diabetes mellitus with diabetic neuropathy, unspecified: Secondary | ICD-10-CM

## 2023-05-27 DIAGNOSIS — Z7984 Long term (current) use of oral hypoglycemic drugs: Secondary | ICD-10-CM

## 2023-05-27 DIAGNOSIS — G609 Hereditary and idiopathic neuropathy, unspecified: Secondary | ICD-10-CM | POA: Diagnosis not present

## 2023-05-27 DIAGNOSIS — F17210 Nicotine dependence, cigarettes, uncomplicated: Secondary | ICD-10-CM

## 2023-05-27 DIAGNOSIS — I10 Essential (primary) hypertension: Secondary | ICD-10-CM

## 2023-05-27 DIAGNOSIS — E118 Type 2 diabetes mellitus with unspecified complications: Secondary | ICD-10-CM

## 2023-05-27 DIAGNOSIS — F1120 Opioid dependence, uncomplicated: Secondary | ICD-10-CM

## 2023-05-27 LAB — POCT GLYCOSYLATED HEMOGLOBIN (HGB A1C): Hemoglobin A1C: 6.1 % — AB (ref 4.0–5.6)

## 2023-05-27 LAB — GLUCOSE, CAPILLARY: Glucose-Capillary: 102 mg/dL — ABNORMAL HIGH (ref 70–99)

## 2023-05-27 MED ORDER — GABAPENTIN 100 MG PO CAPS: 100.0000 mg | ORAL_CAPSULE | Freq: Three times a day (TID) | ORAL | 0 refills | Status: DC

## 2023-05-27 MED ORDER — NICOTINE 21 MG/24HR TD PT24: 21.0000 mg | MEDICATED_PATCH | TRANSDERMAL | 1 refills | Status: AC

## 2023-05-27 NOTE — Progress Notes (Unsigned)
 CC: Routine follow-up  HPI:  [name] is a 68 y.o. female living with a history stated below and presents today for routine follow-up and also discuss her other chronic conditions. Please see problem based assessment and plan for additional details.  Past Medical History:  Diagnosis Date   ACE inhibitor-aggravated angioedema, initial encounter 04/26/2016   Allergic rhinitis    Anxiety    Arthritis    Calculus of gallbladder without cholecystitis without obstruction 10/28/2018   Chronic back pain    CKD (chronic kidney disease), stage III (HCC)    folllowed by pcp   Coronary artery disease    Depression    Diabetic neuropathy (HCC)    Edema of lower extremity 10/28/2018   Encounter for completion of form with patient 06/21/2019   Environmental allergies    Environmental allergies 12/28/2015   Fibromyalgia    Foot ulcer secondary to diabetes 06/21/2019   Frequency of urination    History of chronic bronchitis    History of drug abuse (HCC) states quit herion in 2006   History of hepatitis C last abd ultrasound in epic 11-10-2018;  last hepative panel in epic 11-30-2018 was normal   previously followed by [facility] (notes in care everywhere)-- dx 04/ 2015,  started harvonic treatment 07/ 2015   History of rib fracture 10/28/2018   HPV in female 06/11/2016   Hyperlipidemia associated with type 2 diabetes mellitus (HCC) 12/13/2015   Hypertension    followed by pcp  (nuclear stress test 01-15-2017 in epic,  showed low risk normal w/ nuclear ef 55%)   Laceration of right foot 03/04/2019   Lipoma of neck 07/16/2017   Seen by general surgery Dr Rose [name] May 16 - plan for removal of 2cm sebaceous cyst.    Methadone maintenance therapy patient (HCC)    Mild asthma    followed by pcp   Nocturia    PAD (peripheral artery disease) (HCC)    followed by cardiology, dr Rose [name]---  06-01-2019  s/p bilateral CIA angioplasty stenting   Recurrent falls  04/30/2016   Right foot ulcer (HCC)    Symptomatic anemia 11/15/2020   Tendonitis    bilateral wrist   Type 2 diabetes mellitus (HCC)    followed by pcp   (06-16-2019  per pt checks blood sugar every other day in am,  fasting sugar 92-95)   Urge urinary incontinence     Current Outpatient Medications on File Prior to Visit  Medication Sig Dispense Refill   acetaminophen (TYLENOL) 500 MG tablet Take 2 tablets (1,000 mg total) by mouth every 8 (eight) hours as needed for mild pain (pain score 1-3). 30 tablet 0   albuterol (VENTOLIN HFA) 108 (90 Base) MCG/ACT inhaler Inhale 2 puffs into the lungs every 6 (six) hours as needed. For wheezing 1 each 2   ASPIRIN EC ADULT LOW DOSE 81 MG tablet TAKE ONE TABLET (81 MG TOTAL) BY MOUTH DAILY AT 9AM. SWALLOW WHOLE. 30 tablet 11   atorvastatin (LIPITOR) 80 MG tablet Take 1 tablet (80 mg total) by mouth daily. 30 tablet 11   carvedilol (COREG) 6.25 MG tablet Take 1 tablet (6.25 mg total) by mouth 2 (two) times daily. 60 tablet 11   cetirizine (ZYRTEC) 10 MG tablet Take 1 tablet (10 mg total) by mouth daily. 30 tablet 0   citalopram (CELEXA) 20 MG tablet Take 20 mg by mouth daily.     clopidogrel (PLAVIX) 75 MG tablet Take 1 tablet (75  mg total) by mouth daily. 90 tablet 11   DULoxetine (CYMBALTA) 60 MG capsule TAKE ONE CAPSULE (60MG  TOTAL) BY MOUTH DAILY AT 9AM 30 capsule 2   fluticasone (FLONASE) 50 MCG/ACT nasal spray SHAKE LIQUID AND INSTILL ONE SPRAY IN EACH NOSTRIL EVERY DAY (Patient taking differently: Place 1 spray into both nostrils daily as needed for allergies.) 16 g 4   GERI-KOT 8.6 MG tablet TAKE TWO TABLETS BY MOUTH DAILY AT 9AM (Patient taking differently: Take 2 tablets by mouth daily.) 60 tablet 11   hydrochlorothiazide (HYDRODIURIL) 25 MG tablet Take 25 mg by mouth daily.     metformin (FORTAMET) 1000 MG (OSM) 24 hr tablet Take 1,000 mg by mouth daily with breakfast.     methadone (DOLOPHINE) 10 MG/5ML solution Take 116 mg by mouth daily.      Multiple Vitamins-Minerals (CENTRUM WOMEN) TABS Take 1 tablet by mouth daily.     neomycin-polymyxin-hydrocortisone (CORTISPORIN) 3.5-10000-1 OTIC suspension Place 4 drops into the left ear 3 (three) times daily for 7 days. 10 mL 0   oxyCODONE-acetaminophen (PERCOCET/ROXICET) 5-325 MG tablet Take 1 tablet by mouth every 4 (four) hours as needed for up to 16 doses for severe pain (pain score 7-10). 16 tablet 0   Sennosides (SENNA) 8.6 MG CAPS Take 2 tablets by mouth daily. 60 capsule 2   SYNJARDY XR 12-998 MG TB24 TAKE ONE TABLET BY MOUTH DAILY AT 9AM 60 tablet 11   No current facility-administered medications on file prior to visit.    Family History  Problem Relation Age of Onset   Hypertension Mother    Diabetes type II Mother    Heart attack Mother 28   CAD Father    Heart attack Father 59   Hypertension Brother    Diabetes type II Brother    Heart attack Brother 71   Diabetes type II Sister     Social History   Socioeconomic History   Marital status: Single    Spouse name: Not on file   Number of children: Not on file   Years of education: Not on file   Highest education level: Not on file  Occupational History   Not on file  Tobacco Use   Smoking status: Some Days    Current packs/day: 0.50    Average packs/day: 0.5 packs/day for 20.0 years (10.0 ttl pk-yrs)    Types: Cigarettes   Smokeless tobacco: Never   Tobacco comments:    SMOKES 4 CIGS PER DAY  Vaping Use   Vaping status: Never Used  Substance and Sexual Activity   Alcohol use: No    Alcohol/week: 0.0 standard drinks of alcohol   Drug use: Not Currently    Comment: hx heroin use, IV,  last used 2006 (currently on methadone)   Sexual activity: Not Currently  Other Topics Concern   Not on file  Social History Narrative   Not on file   Social Drivers of Health   Financial Resource Strain: Low Risk  (09/23/2022)   Overall Financial Resource Strain (CARDIA)    Difficulty of Paying Living Expenses:  Not hard at all  Food Insecurity: No Food Insecurity (03/26/2023)   Hunger Vital Sign    Worried About Running Out of Food in the Last Year: Never true    Ran Out of Food in the Last Year: Never true  Transportation Needs: No Transportation Needs (03/26/2023)   PRAPARE - Administrator, Civil Service (Medical): No    Lack of Transportation (  Non-Medical): No  Physical Activity: Insufficiently Active (09/23/2022)   Exercise Vital Sign    Days of Exercise per Week: 2 days    Minutes of Exercise per Session: 20 min  Stress: No Stress Concern Present (09/23/2022)   [employer] of Occupational Health - Occupational Stress Questionnaire    Feeling of Stress : Not at all  Social Connections: Moderately Isolated (09/23/2022)   Social Connection and Isolation Panel [NHANES]    Frequency of Communication with Friends and Family: Three times a week    Frequency of Social Gatherings with Friends and Family: Twice a week    Attends Religious Services: More than 4 times per year    Active Member of [employer] or Organizations: No    Attends Banker Meetings: Never    Marital Status: Never married  Intimate Partner Violence: Not At Risk (03/26/2023)   Humiliation, Afraid, Rape, and Kick questionnaire    Fear of Current or Ex-Partner: No    Emotionally Abused: No    Physically Abused: No    Sexually Abused: No    Review of Systems: ROS negative except for what is noted on the assessment and plan.  Vitals:   05/27/23 1042 05/27/23 1058  BP: (!) 140/60 130/60  Pulse: 72 70  Temp: 98.2 F (36.8 C)   TempSrc: Oral   SpO2: 99%   Weight: 135 lb 8 oz (61.5 kg)     Physical Exam: Constitutional: well-appearing woman, sitting in chair , in no acute distress HENT: normocephalic atraumatic, mucous membranes moist Eyes: conjunctiva non-erythematous Cardiovascular: regular rate and rhythm, no m/r/g Pulmonary/Chest: normal work of breathing on room air, lungs clear to  auscultation bilaterally Abdominal: soft, non-tender, non-distended MSK: normal bulk and tone Neurological: alert & oriented x 3, no focal deficit Skin: warm and dry Psych: normal mood and behavior  Assessment & Plan:   Bilateral peripheral neuropathy primary involving distal fingers Patient continues to endorse worsening bilateral peripheral neuropathy during her visit today.  She report her its getting worse despite taking 100 mg of gabapentin daily.  Recommended and advised the patient to take up to 300 mg of gabapentin daily.  Kidney function reviewed.  I spent a decent amount of time counseling patient on smoking is a risk factor for peripheral neuropathy.  Patient is grateful for the counseling and promises to work on smoking cessation.  - Increase gabapentin to up to 300 mg daily   Type 2 diabetes mellitus with complication, without long-term current use of insulin (HCC) Last hemoglobin A1c done in November 2024 is 6.7, currently on Synjardy .Repeat A1c is 6.1.  Patient denies any polydipsia or polyuria at this time -Continue Synjardy  Essential hypertension BP Readings from Last 3 Encounters:  05/27/23 130/60  04/09/23 (!) 152/66  03/27/23 (!) 113/55    History of hypertension currently being managed with H CTZ and Coreg.  Blood pressure in the office today as a borderline goal of 130/60. -Continue same regimen -Encourage continued adherence    Patient discussed with Dr. Rip Harbour, M.D [facility] Phone: [phone] Date 05/27/2023 Time 11:42 AM

## 2023-05-27 NOTE — Assessment & Plan Note (Signed)
 Patient continues to endorse worsening bilateral peripheral neuropathy during her visit today.  She report her its getting worse despite taking 100 mg of gabapentin daily.  Recommended and advised the patient to take up to 300 mg of gabapentin daily.  Kidney function reviewed.  I spent a decent amount of time counseling patient on smoking is a risk factor for peripheral neuropathy.  Patient is grateful for the counseling and promises to work on smoking cessation.  - Increase gabapentin to up to 300 mg daily

## 2023-05-27 NOTE — Assessment & Plan Note (Addendum)
 BP Readings from Last 3 Encounters:  05/27/23 130/60  04/09/23 (!) 152/66  03/27/23 (!) 113/55    History of hypertension currently being managed with H CTZ and Coreg.  Blood pressure in the office today as a borderline goal of 130/60. -Continue same regimen -Encourage continued adherence

## 2023-05-27 NOTE — Patient Instructions (Signed)
 Thank you, Ms.Idalie Georga Kaufmann for allowing [place] to provide your care today. Today we discussed discussed your general health including your blood pressure and your blood glucose.  Also discussed tobacco cessation for which you agreed to try the nicotine patch at this time.  I am prescribing you 21 mg of nicotine patch please try it and follow-up with [place] in 2 weeks let me know how you do not.  Your blood sugar is consistently being controlled on Synjardy.  Please continue taking this medication as prescribed.  For neuropathy you did mention that is getting worse.  I suggest that you take up to 300 mg of your gabapentin daily.  Please do not exceed this dose because of your kidney function.  I suspect that increased tobacco smoking can worsen peripheral neuropathy.  It is my hope that you are able to cut down on the smoking with the patches that have prescribed and that can help with your neuropathy  I have ordered the following labs for you:  Lab Orders         Glucose, capillary         POC Hbg A1C      Tests ordered today:    Referrals ordered today:   Referral Orders  No referral(s) requested today     I have ordered the following medication/changed the following medications:   Stop the following medications: Medications Discontinued During This Encounter  Medication Reason   nicotine (NICODERM CQ - DOSED IN MG/24 HR) 7 mg/24hr patch      Start the following medications: Meds ordered this encounter  Medications   nicotine (NICODERM CQ - DOSED IN MG/24 HOURS) 21 mg/24hr patch    Sig: Place 1 patch (21 mg total) onto the skin daily.    Dispense:  30 patch    Refill:  1     Follow up:  4 weeks     Remember:   Should you have any questions or concerns please call the internal medicine clinic at [phone].    [name], M.D [facility]

## 2023-05-27 NOTE — Assessment & Plan Note (Signed)
 Last hemoglobin A1c done in November 2024 is 6.7, currently on Synjardy .Repeat A1c is 6.1.  Patient denies any polydipsia or polyuria at this time -Continue Synjardy

## 2023-05-28 ENCOUNTER — Ambulatory Visit: Payer: 59 | Admitting: Podiatry

## 2023-06-01 ENCOUNTER — Other Ambulatory Visit: Payer: Self-pay | Admitting: Student

## 2023-06-01 ENCOUNTER — Other Ambulatory Visit: Payer: Self-pay

## 2023-06-01 ENCOUNTER — Telehealth: Payer: Self-pay | Admitting: *Deleted

## 2023-06-01 DIAGNOSIS — Z1231 Encounter for screening mammogram for malignant neoplasm of breast: Secondary | ICD-10-CM

## 2023-06-01 MED ORDER — SENNOSIDES 8.6 MG PO TABS: ORAL_TABLET | ORAL | 11 refills | Status: AC

## 2023-06-01 NOTE — Telephone Encounter (Signed)
 Mammogram appointment 09-07-2023 @ 1:00 pm arrive 12:45 pm/ lvm for patient regarding the $75 no show fee for not reschedule or canceling  appointment, to contact their office at [phone] appointment also mailed to the patient.

## 2023-06-01 NOTE — Progress Notes (Signed)
 Internal Medicine Clinic Attending  Case discussed with the resident at the time of the visit.  We reviewed the resident's history and exam and pertinent patient test results.  I agree with the assessment, diagnosis, and plan of care documented in the resident's note.

## 2023-06-01 NOTE — Addendum Note (Signed)
 Addended by: Dickie La on: 06/01/2023 01:52 PM   Modules accepted: Level of Service

## 2023-06-02 ENCOUNTER — Encounter: Payer: Self-pay | Admitting: Cardiovascular Disease

## 2023-06-02 ENCOUNTER — Ambulatory Visit: Payer: 59 | Attending: Cardiovascular Disease | Admitting: Cardiovascular Disease

## 2023-06-02 VITALS — BP 130/66 | HR 76 | Ht 62.5 in | Wt 130.2 lb

## 2023-06-02 DIAGNOSIS — I10 Essential (primary) hypertension: Secondary | ICD-10-CM

## 2023-06-02 DIAGNOSIS — E785 Hyperlipidemia, unspecified: Secondary | ICD-10-CM | POA: Diagnosis not present

## 2023-06-02 DIAGNOSIS — I739 Peripheral vascular disease, unspecified: Secondary | ICD-10-CM | POA: Diagnosis not present

## 2023-06-02 DIAGNOSIS — I2510 Atherosclerotic heart disease of native coronary artery without angina pectoris: Secondary | ICD-10-CM | POA: Diagnosis not present

## 2023-06-02 DIAGNOSIS — Z720 Tobacco use: Secondary | ICD-10-CM

## 2023-06-02 NOTE — Progress Notes (Signed)
 Cardiology Office Note   Date:  06/02/2023   ID:  Rose, [name] 04-Jun-1955, MRN [id number]  PCP:  [name], MD  Cardiologist:   Lorine Bears, MD   No chief complaint on file.      History of Present Illness: Rose [name] is a 68 y.o. female who is here today for follow-up visit regarding peripheral arterial disease and coronary artery disease.  The patient has multiple chronic medical conditions that include type 2 diabetes, essential hypertension, hyperlipidemia, remote IV drug use and tobacco use. She is status post bilateral common iliac artery stenting done in March 2021.  She had no significant infrainguinal disease at that time.  She had an ulceration on the right lateral foot at that time.   She was seen in September of 2021 for exertional chest pain.  She underwent a Lexiscan Myoview which showed evidence of anterior and anterolateral ischemia.  Cardiac catheterization in October showed severe one-vessel coronary artery disease with 90% heavily calcified stenosis in the mid LAD and moderate left circumflex disease.  Ejection fraction was normal with mildly elevated left ventricular end-diastolic pressure.  I performed successful intravascular lithotripsy and drug eluting stent placement to the mid LAD.    She was able to quit smoking at some point but she relapsed after the death of her sister.   She was seen in 2024 for a small ulceration at the bottom of the right foot after an injury in early 2024. I proceeded with angiography in March 2024 which showed patent common iliac artery stent with mild in-stent restenosis, mild nonobstructive disease affecting the right SFA and popliteal arteries with three-vessel runoff below the knee.  No revascularization was needed. The ulceration on the right foot healed completely.  However, she injured her right big toe and ultimately got infected.  She had toe amputation which was slow to heal but ultimately  healed completely.  She denies claudication.  She continues to smoke.  Past Medical History:  Diagnosis Date   ACE inhibitor-aggravated angioedema, initial encounter 04/26/2016   Allergic rhinitis    Anxiety    Arthritis    Calculus of gallbladder without cholecystitis without obstruction 10/28/2018   Chronic back pain    CKD (chronic kidney disease), stage III (HCC)    folllowed by pcp   Coronary artery disease    Depression    Diabetic neuropathy (HCC)    Edema of lower extremity 10/28/2018   Encounter for completion of form with patient 06/21/2019   Environmental allergies    Environmental allergies 12/28/2015   Fibromyalgia    Foot ulcer secondary to diabetes 06/21/2019   Frequency of urination    History of chronic bronchitis    History of drug abuse (HCC) states quit herion in 2006   History of hepatitis C last abd ultrasound in epic 11-10-2018;  last hepative panel in epic 11-30-2018 was normal   previously followed by [facility] (notes in care everywhere)-- dx 04/ 2015,  started harvonic treatment 07/ 2015   History of rib fracture 10/28/2018   HPV in female 06/11/2016   Hyperlipidemia associated with type 2 diabetes mellitus (HCC) 12/13/2015   Hypertension    followed by pcp  (nuclear stress test 01-15-2017 in epic,  showed low risk normal w/ nuclear ef 55%)   Laceration of right foot 03/04/2019   Lipoma of neck 07/16/2017   Seen by general surgery Dr Carolynne Edouard May 16 - plan for removal of 2cm sebaceous  cyst.    Methadone maintenance therapy patient (HCC)    Mild asthma    followed by pcp   Nocturia    PAD (peripheral artery disease) (HCC)    followed by cardiology, dr Kirke Corin---  06-01-2019  s/p bilateral CIA angioplasty stenting   Recurrent falls 04/30/2016   Right foot ulcer (HCC)    Symptomatic anemia 11/15/2020   Tendonitis    bilateral wrist   Type 2 diabetes mellitus (HCC)    followed by pcp   (06-16-2019  per pt checks blood sugar every other  day in am,  fasting sugar 92-95)   Urge urinary incontinence     Past Surgical History:  Procedure Laterality Date   ABDOMINAL AORTOGRAM W/LOWER EXTREMITY Bilateral 06/01/2019   Procedure: ABDOMINAL AORTOGRAM W/LOWER EXTREMITY;  Surgeon: [name], MD;  [facility];  Service: Cardiovascular;  Laterality: Bilateral;   ABDOMINAL AORTOGRAM W/LOWER EXTREMITY N/A 06/04/2022   Procedure: ABDOMINAL AORTOGRAM W/LOWER EXTREMITY;  Surgeon: [name], MD;  Location: [facility];  Service: Cardiovascular;  Laterality: N/A;   AMPUTATION TOE Right 03/23/2023   Procedure: AMPUTATION TOE;  Surgeon: Pilar Plate, DPM;  Location: [facility];  Service: Orthopedics/Podiatry;  Laterality: Right;  Right great toe amputation   APPENDECTOMY  1972   BREAST BIOPSY Right 09/24/2022   MM RT BREAST BX W LOC DEV 1ST LESION IMAGE BX SPEC STEREO GUIDE 09/24/2022 [facility]   INTERSTIM IMPLANT PLACEMENT  10/23/2011   Procedure: Leane Platt IMPLANT FIRST STAGE;  Surgeon: Martina Sinner, MD;  Location: [facility];  Service: Urology;  Laterality: N/A;   INTERSTIM IMPLANT PLACEMENT  10/23/2011   Procedure: Leane Platt IMPLANT SECOND STAGE;  Surgeon: Martina Sinner, MD;  Location: [facility];  Service: Urology;  Laterality: N/A;   LEFT HEART CATH AND CORONARY ANGIOGRAPHY N/A 01/11/2020   Procedure: LEFT HEART CATH AND CORONARY ANGIOGRAPHY;  Surgeon: [name], MD;  [facility];  Service: Cardiovascular;  Laterality: N/A;   PANACOS TISSUE GRAFT Right 06/24/2019   Procedure: Application of Skin Graft Substitute;  Surgeon: Park Liter, DPM;  Location: [facility] [place];  Service: Podiatry;  Laterality: Right;   PERIPHERAL VASCULAR INTERVENTION Bilateral 06/01/2019   Procedure: PERIPHERAL VASCULAR INTERVENTION;  Surgeon: [name], MD;  [facility];  Service: Cardiovascular;  Laterality:  Bilateral;   WOUND DEBRIDEMENT Right 06/24/2019   Procedure: DEBRIDEMENT WOUND;  Surgeon: Park Liter, DPM;  Location: [facility];  Service: Podiatry;  Laterality: Right;   WOUND DEBRIDEMENT Right 08/24/2019   Procedure: DEBRIDEMENT RIGHT FOOT WOUND; APPLICATION OF SKIN GRAFT SUBSTITUTE;  Surgeon: Park Liter, DPM;  Location: [facility];  Service: Podiatry;  Laterality: Right;     Current Outpatient Medications  Medication Sig Dispense Refill   acetaminophen (TYLENOL) 500 MG tablet Take 2 tablets (1,000 mg total) by mouth every 8 (eight) hours as needed for mild pain (pain score 1-3). 30 tablet 0   albuterol (VENTOLIN HFA) 108 (90 Base) MCG/ACT inhaler Inhale 2 puffs into the lungs every 6 (six) hours as needed. For wheezing 1 each 2   ASPIRIN EC ADULT LOW DOSE 81 MG tablet TAKE ONE TABLET (81 MG TOTAL) BY MOUTH DAILY AT 9AM. SWALLOW WHOLE. 30 tablet 11   atorvastatin (LIPITOR) 80 MG tablet Take 1 tablet (80 mg total) by mouth daily. 30 tablet 11   carvedilol (COREG) 6.25 MG tablet  Take 1 tablet (6.25 mg total) by mouth 2 (two) times daily. 60 tablet 11   cetirizine (ZYRTEC) 10 MG tablet Take 1 tablet (10 mg total) by mouth daily. 30 tablet 0   clopidogrel (PLAVIX) 75 MG tablet Take 1 tablet (75 mg total) by mouth daily. 90 tablet 11   DULoxetine (CYMBALTA) 60 MG capsule TAKE ONE CAPSULE (60MG  TOTAL) BY MOUTH DAILY AT 9AM 30 capsule 2   fluticasone (FLONASE) 50 MCG/ACT nasal spray SHAKE LIQUID AND INSTILL ONE SPRAY IN EACH NOSTRIL EVERY DAY (Patient taking differently: Place 1 spray into both nostrils daily as needed for allergies.) 16 g 4   gabapentin (NEURONTIN) 100 MG capsule Take 1 capsule (100 mg total) by mouth 3 (three) times daily. 270 capsule 0   hydrochlorothiazide (HYDRODIURIL) 25 MG tablet Take 25 mg by mouth daily.     metformin (FORTAMET) 1000 MG (OSM) 24 hr tablet Take 1,000 mg by mouth daily with breakfast.     methadone (DOLOPHINE) 10  MG/5ML solution Take 116 mg by mouth daily.     Multiple Vitamins-Minerals (CENTRUM WOMEN) TABS Take 1 tablet by mouth daily.     neomycin-polymyxin-hydrocortisone (CORTISPORIN) 3.5-10000-1 OTIC suspension Place 4 drops into the left ear 3 (three) times daily for 7 days. 10 mL 0   senna (GERI-KOT) 8.6 MG tablet TAKE TWO TABLETS BY MOUTH DAILY AT 9 am 60 tablet 11   SYNJARDY XR 12-998 MG TB24 TAKE ONE TABLET BY MOUTH DAILY AT 9AM 60 tablet 11   citalopram (CELEXA) 20 MG tablet Take 20 mg by mouth daily. (Patient not taking: Reported on 06/02/2023)     nicotine (NICODERM CQ - DOSED IN MG/24 HOURS) 21 mg/24hr patch Place 1 patch (21 mg total) onto the skin daily. (Patient not taking: Reported on 06/02/2023) 30 patch 1   oxyCODONE-acetaminophen (PERCOCET/ROXICET) 5-325 MG tablet Take 1 tablet by mouth every 4 (four) hours as needed for up to 16 doses for severe pain (pain score 7-10). (Patient not taking: Reported on 06/02/2023) 16 tablet 0   Sennosides (SENNA) 8.6 MG CAPS Take 2 tablets by mouth daily. (Patient not taking: Reported on 06/02/2023) 60 capsule 2   No current facility-administered medications for this visit.    Allergies:   Lisinopril, Gabapentin, Vicodin [hydrocodone-acetaminophen], and Hydrocodone-acetaminophen    Social History:  The patient  reports that she has been smoking cigarettes. She has a 10 pack-year smoking history. She has never used smokeless tobacco. She reports that she does not currently use drugs. She reports that she does not drink alcohol.   Family History:  The patient's family history includes CAD in her father; Diabetes type II in her brother, mother, and sister; Heart attack (age of onset: 47) in her brother; Heart attack (age of onset: 15) in her father and mother; Hypertension in her brother and mother.    ROS:  Please see the history of present illness.   Otherwise, review of systems are positive for none.   All other systems are reviewed and negative.     PHYSICAL EXAM: VS:  BP 130/66   Pulse 76   Ht 5' 2.5" (1.588 m)   Wt 130 lb 3.2 oz (59.1 kg)   LMP 07/31/2011   SpO2 97%   BMI 23.43 kg/m  , BMI Body mass index is 23.43 kg/m. GEN: Well nourished, well developed, in no acute distress  HEENT: normal  Neck: no JVD, carotid bruits, or masses Cardiac: RRR; no murmurs, rubs, or gallops, mild bilateral leg  edema Respiratory:  clear to auscultation bilaterally, normal work of breathing GI: soft, nontender, nondistended, + BS MS: no deformity or atrophy  Skin: warm and dry, no rash Neuro:  Strength and sensation are intact Psych: euthymic mood, full affect    EKG:  EKG is not ordered today.   Recent Labs: 03/22/2023: ALT 10 03/23/2023: Magnesium 2.3 03/27/2023: BUN 19; Creatinine, Ser 1.13; Hemoglobin 8.6; Platelets 291; Potassium 3.9; Sodium 136    Lipid Panel    Component Value Date/Time   CHOL 98 (L) 12/10/2021 1115   TRIG 160 (H) 12/10/2021 1115   HDL 29 (L) 12/10/2021 1115   CHOLHDL 3.4 12/10/2021 1115   LDLCALC 42 12/10/2021 1115      Wt Readings from Last 3 Encounters:  06/02/23 130 lb 3.2 oz (59.1 kg)  05/27/23 135 lb 8 oz (61.5 kg)  04/09/23 137 lb 3.2 oz (62.2 kg)           12/19/2016    1:37 PM  PAD Screen  Previous PAD dx? No  Previous surgical procedure? No  Pain with walking? No  Feet/toe relief with dangling? Yes  Painful, non-healing ulcers? No  Extremities discolored? No      ASSESSMENT AND PLAN:  1.  Peripheral arterial disease: Status post  stent placement to bilateral common iliac arteries.  Most recent angiography in March, 2024 showed patent iliac stents with mild in-stent restenosis.  Continue medical therapy.  Continue clopidogrel 75 mg once daily. The right big toe amputation site has healed completely.  Her blood flow should be optimal for healing.  2. Coronary artery disease involving native coronary arteries without angina : She is doing well with no anginal symptoms.   Continue medical therapy.   3.  Tobacco use: I again discussed with her the importance of smoking cessation.  4.  Hyperlipidemia: Continue treatment with atorvastatin.  Most recent lipid profile showed an LDL of 42.  5.  Essential hypertension: Blood pressure is controlled.   Disposition:   Follow-up in 6 months.  Signed,  Lorine Bears, MD  06/02/2023 9:37 AM    [facility]

## 2023-06-02 NOTE — Patient Instructions (Signed)
 Medication Instructions:  No changes *If you need a refill on your cardiac medications before your next appointment, please call your pharmacy*   Lab Work: None ordered If you have labs (blood work) drawn today and your tests are completely normal, you will receive your results only by: MyChart Message (if you have MyChart) OR A paper copy in the mail If you have any lab test that is abnormal or we need to change your treatment, we will call you to review the results.   Testing/Procedures: None ordered   Follow-Up: At [facility], you and your health needs are our priority.  As part of our continuing mission to provide you with exceptional heart care, we have created designated Provider Care Teams.  These Care Teams include your primary Cardiologist (physician) and Advanced Practice Providers (APPs -  Physician Assistants and Nurse Practitioners) who all work together to provide you with the care you need, when you need it.  We recommend signing up for the patient portal called "MyChart".  Sign up information is provided on this After Visit Summary.  MyChart is used to connect with patients for Virtual Visits (Telemedicine).  Patients are able to view lab/test results, encounter notes, upcoming appointments, etc.  Non-urgent messages can be sent to your provider as well.   To learn more about what you can do with MyChart, go to [email or web address].    Your next appointment:   6 month(s)  Provider:   Lorine Bears, MD

## 2023-06-04 ENCOUNTER — Ambulatory Visit: Payer: 59 | Admitting: Podiatry

## 2023-06-11 ENCOUNTER — Ambulatory Visit (INDEPENDENT_AMBULATORY_CARE_PROVIDER_SITE_OTHER): Admitting: Podiatry

## 2023-06-11 ENCOUNTER — Encounter: Payer: Self-pay | Admitting: Podiatry

## 2023-06-11 DIAGNOSIS — Z89419 Acquired absence of unspecified great toe: Secondary | ICD-10-CM

## 2023-06-11 DIAGNOSIS — T8130XA Disruption of wound, unspecified, initial encounter: Secondary | ICD-10-CM | POA: Diagnosis not present

## 2023-06-11 DIAGNOSIS — E1151 Type 2 diabetes mellitus with diabetic peripheral angiopathy without gangrene: Secondary | ICD-10-CM | POA: Diagnosis not present

## 2023-06-11 MED ORDER — MUPIROCIN 2 % EX OINT: 1.0000 | TOPICAL_OINTMENT | Freq: Two times a day (BID) | CUTANEOUS | 0 refills | Status: AC

## 2023-06-11 NOTE — Progress Notes (Signed)
 Date of surgery: 03/23/2023 Procedure: Right first toe amputation with a level of the MPJ with Dr. Annamary Rummage  HPI: 68 y.o. female presents today for follow up evaluation of left 1st toe amputation site dehiscence.  She states that it is healing well. Denies pain. Denies any signs of infection today.  Past Medical History:  Diagnosis Date   ACE inhibitor-aggravated angioedema, initial encounter 04/26/2016   Allergic rhinitis    Anxiety    Arthritis    Calculus of gallbladder without cholecystitis without obstruction 10/28/2018   Chronic back pain    CKD (chronic kidney disease), stage III (HCC)    folllowed by pcp   Coronary artery disease    Depression    Diabetic neuropathy (HCC)    Edema of lower extremity 10/28/2018   Encounter for completion of form with patient 06/21/2019   Environmental allergies    Environmental allergies 12/28/2015   Fibromyalgia    Foot ulcer secondary to diabetes 06/21/2019   Frequency of urination    History of chronic bronchitis    History of drug abuse (HCC) states quit herion in 2006   History of hepatitis C last abd ultrasound in epic 11-10-2018;  last hepative panel in epic 11-30-2018 was normal   previously followed by [facility] (notes in care everywhere)-- dx 04/ 2015,  started harvonic treatment 07/ 2015   History of rib fracture 10/28/2018   HPV in female 06/11/2016   Hyperlipidemia associated with type 2 diabetes mellitus (HCC) 12/13/2015   Hypertension    followed by pcp  (nuclear stress test 01-15-2017 in epic,  showed low risk normal w/ nuclear ef 55%)   Laceration of right foot 03/04/2019   Lipoma of neck 07/16/2017   Seen by general surgery Dr Carolynne Edouard May 16 - plan for removal of 2cm sebaceous cyst.    Methadone maintenance therapy patient (HCC)    Mild asthma    followed by pcp   Nocturia    PAD (peripheral artery disease) (HCC)    followed by cardiology, dr Kirke Corin---  06-01-2019  s/p bilateral CIA  angioplasty stenting   Recurrent falls 04/30/2016   Right foot ulcer (HCC)    Symptomatic anemia 11/15/2020   Tendonitis    bilateral wrist   Type 2 diabetes mellitus (HCC)    followed by pcp   (06-16-2019  per pt checks blood sugar every other day in am,  fasting sugar 92-95)   Urge urinary incontinence     Past Surgical History:  Procedure Laterality Date   ABDOMINAL AORTOGRAM W/LOWER EXTREMITY Bilateral 06/01/2019   Procedure: ABDOMINAL AORTOGRAM W/LOWER EXTREMITY;  Surgeon: [name], MD;  [facility];  Service: Cardiovascular;  Laterality: Bilateral;   ABDOMINAL AORTOGRAM W/LOWER EXTREMITY N/A 06/04/2022   Procedure: ABDOMINAL AORTOGRAM W/LOWER EXTREMITY;  Surgeon: [name], MD;  [facility]: [facility];  Service: Cardiovascular;  Laterality: N/A;   AMPUTATION TOE Right 03/23/2023   Procedure: AMPUTATION TOE;  Surgeon: Pilar Plate, DPM;  Location: [facility];  Service: Orthopedics/Podiatry;  Laterality: Right;  Right great toe amputation   APPENDECTOMY  1972   BREAST BIOPSY Right 09/24/2022   MM RT BREAST BX W LOC DEV 1ST LESION IMAGE BX SPEC STEREO GUIDE 09/24/2022 [facility]   INTERSTIM IMPLANT PLACEMENT  10/23/2011   Procedure: Leane Platt IMPLANT FIRST STAGE;  Surgeon: Martina Sinner, MD;  Location: [facility];  Service: Urology;  Laterality: N/A;  INTERSTIM IMPLANT PLACEMENT  10/23/2011   Procedure: Leane Platt IMPLANT SECOND STAGE;  Surgeon: Martina Sinner, MD;  [facility];  Service: Urology;  Laterality: N/A;   LEFT HEART CATH AND CORONARY ANGIOGRAPHY N/A 01/11/2020   Procedure: LEFT HEART CATH AND CORONARY ANGIOGRAPHY;  Surgeon: [name], MD;  [facility]: [facility];  Service: Cardiovascular;  Laterality: N/A;   PANACOS TISSUE GRAFT Right 06/24/2019   Procedure: Application of Skin Graft Substitute;  Surgeon: Park Liter, DPM;  Location: [facility];   Service: Podiatry;  Laterality: Right;   PERIPHERAL VASCULAR INTERVENTION Bilateral 06/01/2019   Procedure: PERIPHERAL VASCULAR INTERVENTION;  Surgeon: [name], MD;  [facility];  Service: Cardiovascular;  Laterality: Bilateral;   WOUND DEBRIDEMENT Right 06/24/2019   Procedure: DEBRIDEMENT WOUND;  Surgeon: Park Liter, DPM;  Location: [facility];  Service: Podiatry;  Laterality: Right;   WOUND DEBRIDEMENT Right 08/24/2019   Procedure: DEBRIDEMENT RIGHT FOOT WOUND; APPLICATION OF SKIN GRAFT SUBSTITUTE;  Surgeon: Park Liter, DPM;  Location: [facility] ;  Service: Podiatry;  Laterality: Right;    Allergies  Allergen Reactions   Lisinopril Swelling    Angioedema   Gabapentin Hives and Other (See Comments)    Only when takes over 300 mg dose. "gives me the shakes"   Vicodin [Hydrocodone-Acetaminophen] Hives, Diarrhea and Other (See Comments)    Stomach  cramps   Hydrocodone-Acetaminophen     Physical Exam: There were no vitals filed for this visit.  General: The patient is alert and oriented x3 in no acute distress.  Dermatology: Area of superficial dehiscence with loosely adhered overyling callus. Underlying subcutaneous tissue, granulation tissue present.Lateral aspect of the amputation site 0.3 mm in diameter.  Appears stable.  Limited to No pain on palpation.  No focal warmth increased.  No drainage noted.   Vascular: Foot appears warm and well-perfused.  Cap refill intact to the remaining digits 3-5 seconds.  Pedal pulses nonpalpable.  Pedal hair growth absent.  Neurological: Light touch sensation grossly intact bilateral feet.   Musculoskeletal Exam: Status post right hallux amputation  Radiographic Exam: Right foot 3 views 05/14/2023 Status post amputation of first toe with disarticulation at the first metatarsal phalangeal joint.  Normal osseous mineralization.  No soft tissue emphysema.  Assessment/Plan of  Care: 1. Wound dehiscence   2. History of amputation of great toe (HCC)   3. Type II diabetes mellitus with peripheral circulatory disorder (HCC)       Meds ordered this encounter  Medications   mupirocin ointment (BACTROBAN) 2 %    Sig: Apply 1 Application topically 2 (two) times daily for 21 days.    Dispense:  42 g    Refill:  0   None  Discussed clinical findings with patient today.  Plan: -Overyling callus removed -Wound bed appears viable -Dressed with Bactroban and bandage. -Rx sent of mupirocin to be applied 1-2 times a day for the next 2 to 3 weeks. -Pt to monitor for signs of infection - Follow up in 2 weeks for wound care.

## 2023-06-16 ENCOUNTER — Ambulatory Visit: Payer: Self-pay | Admitting: Student

## 2023-06-16 ENCOUNTER — Ambulatory Visit: Admitting: Student

## 2023-06-16 VITALS — BP 132/66 | HR 77 | Temp 97.7°F | Ht 62.5 in | Wt 131.1 lb

## 2023-06-16 DIAGNOSIS — T162XXA Foreign body in left ear, initial encounter: Secondary | ICD-10-CM | POA: Diagnosis not present

## 2023-06-16 MED ORDER — CIPROFLOXACIN-DEXAMETHASONE 0.3-0.1 % OT SUSP
4.0000 [drp] | Freq: Two times a day (BID) | OTIC | 0 refills | Status: AC
  Filled 2023-06-17: qty 7.5, 7d supply, fill #0

## 2023-06-16 NOTE — Patient Instructions (Signed)
 Thank you so much for coming to the clinic today!   I have placed an urgent referral to the ENT doctor.   I have also placed an order for an eardrop. Please place four drops into the left ear two times per day for a total of 7 days.   If you have any questions please feel free to the call the clinic at anytime at [phone]. It was a pleasure seeing you!  Best, Dr. Rayvon Char

## 2023-06-16 NOTE — Assessment & Plan Note (Signed)
 Patient presents with 10-day history of foreign body stuck in her left ear.  Patient notes that she was cleaning her ear when the tip of a Q-tip got stuck.  She tried to remove it with other items and ended up with 2 Q-tips and a cotton ball stuck.  She notes that it has not hurt but it has bled intermittently.  Denies any fever.  On examination, her ear was notable for chondritis with surrounding erythema.  We attempted to remove the foreign bodies in the office, but were unable to remove all of the foreign debris.  Will place urgent ENT referral and given Ciprodex course for 7 days. - Urgent ENT referral - Ciprodex 4 drops in left ear 2 times per day for 7 days

## 2023-06-16 NOTE — Progress Notes (Signed)
 CC: Foreign body in left ear  HPI: Rose [name] is a 68 y.o. female living with a history stated below and presents today for foreign body in left ear. Please see problem based assessment and plan for additional details.  Past Medical History:  Diagnosis Date   ACE inhibitor-aggravated angioedema, initial encounter 04/26/2016   Allergic rhinitis    Anxiety    Arthritis    Calculus of gallbladder without cholecystitis without obstruction 10/28/2018   Chronic back pain    CKD (chronic kidney disease), stage III (HCC)    folllowed by pcp   Coronary artery disease    Depression    Diabetic neuropathy (HCC)    Edema of lower extremity 10/28/2018   Encounter for completion of form with patient 06/21/2019   Environmental allergies    Environmental allergies 12/28/2015   Fibromyalgia    Foot ulcer secondary to diabetes 06/21/2019   Frequency of urination    History of chronic bronchitis    History of drug abuse (HCC) states quit herion in 2006   History of hepatitis C last abd ultrasound in epic 11-10-2018;  last hepative panel in epic 11-30-2018 was normal   previously followed by [facility] (notes in care everywhere)-- dx 04/ 2015,  started harvonic treatment 07/ 2015   History of rib fracture 10/28/2018   HPV in female 06/11/2016   Hyperlipidemia associated with type 2 diabetes mellitus (HCC) 12/13/2015   Hypertension    followed by pcp  (nuclear stress test 01-15-2017 in epic,  showed low risk normal w/ nuclear ef 55%)   Laceration of right foot 03/04/2019   Lipoma of neck 07/16/2017   Seen by general surgery Dr Carolynne Edouard May 16 - plan for removal of 2cm sebaceous cyst.    Methadone maintenance therapy patient (HCC)    Mild asthma    followed by pcp   Nocturia    PAD (peripheral artery disease) (HCC)    followed by cardiology, dr Kirke Corin---  06-01-2019  s/p bilateral CIA angioplasty stenting   Recurrent falls 04/30/2016   Right foot ulcer (HCC)     Symptomatic anemia 11/15/2020   Tendonitis    bilateral wrist   Type 2 diabetes mellitus (HCC)    followed by pcp   (06-16-2019  per pt checks blood sugar every other day in am,  fasting sugar 92-95)   Urge urinary incontinence     Current Outpatient Medications on File Prior to Visit  Medication Sig Dispense Refill   acetaminophen (TYLENOL) 500 MG tablet Take 2 tablets (1,000 mg total) by mouth every 8 (eight) hours as needed for mild pain (pain score 1-3). 30 tablet 0   albuterol (VENTOLIN HFA) 108 (90 Base) MCG/ACT inhaler Inhale 2 puffs into the lungs every 6 (six) hours as needed. For wheezing 1 each 2   ASPIRIN EC ADULT LOW DOSE 81 MG tablet TAKE ONE TABLET (81 MG TOTAL) BY MOUTH DAILY AT 9AM. SWALLOW WHOLE. 30 tablet 11   atorvastatin (LIPITOR) 80 MG tablet Take 1 tablet (80 mg total) by mouth daily. 30 tablet 11   carvedilol (COREG) 6.25 MG tablet Take 1 tablet (6.25 mg total) by mouth 2 (two) times daily. 60 tablet 11   cetirizine (ZYRTEC) 10 MG tablet Take 1 tablet (10 mg total) by mouth daily. 30 tablet 0   citalopram (CELEXA) 20 MG tablet Take 20 mg by mouth daily. (Patient not taking: Reported on 06/02/2023)     clopidogrel (PLAVIX) 75 MG tablet  Take 1 tablet (75 mg total) by mouth daily. 90 tablet 11   DULoxetine (CYMBALTA) 60 MG capsule TAKE ONE CAPSULE (60MG  TOTAL) BY MOUTH DAILY AT 9AM 30 capsule 2   fluticasone (FLONASE) 50 MCG/ACT nasal spray SHAKE LIQUID AND INSTILL ONE SPRAY IN EACH NOSTRIL EVERY DAY (Patient taking differently: Place 1 spray into both nostrils daily as needed for allergies.) 16 g 4   gabapentin (NEURONTIN) 100 MG capsule Take 1 capsule (100 mg total) by mouth 3 (three) times daily. 270 capsule 0   hydrochlorothiazide (HYDRODIURIL) 25 MG tablet Take 25 mg by mouth daily.     metformin (FORTAMET) 1000 MG (OSM) 24 hr tablet Take 1,000 mg by mouth daily with breakfast.     methadone (DOLOPHINE) 10 MG/5ML solution Take 116 mg by mouth daily.     Multiple  Vitamins-Minerals (CENTRUM WOMEN) TABS Take 1 tablet by mouth daily.     mupirocin ointment (BACTROBAN) 2 % Apply 1 Application topically 2 (two) times daily for 21 days. 42 g 0   neomycin-polymyxin-hydrocortisone (CORTISPORIN) 3.5-10000-1 OTIC suspension Place 4 drops into the left ear 3 (three) times daily for 7 days. 10 mL 0   nicotine (NICODERM CQ - DOSED IN MG/24 HOURS) 21 mg/24hr patch Place 1 patch (21 mg total) onto the skin daily. (Patient not taking: Reported on 06/02/2023) 30 patch 1   oxyCODONE-acetaminophen (PERCOCET/ROXICET) 5-325 MG tablet Take 1 tablet by mouth every 4 (four) hours as needed for up to 16 doses for severe pain (pain score 7-10). (Patient not taking: Reported on 06/02/2023) 16 tablet 0   senna (GERI-KOT) 8.6 MG tablet TAKE TWO TABLETS BY MOUTH DAILY AT 9 am 60 tablet 11   Sennosides (SENNA) 8.6 MG CAPS Take 2 tablets by mouth daily. (Patient not taking: Reported on 06/02/2023) 60 capsule 2   SYNJARDY XR 12-998 MG TB24 TAKE ONE TABLET BY MOUTH DAILY AT 9AM 60 tablet 11   No current facility-administered medications on file prior to visit.    Family History  Problem Relation Age of Onset   Hypertension Mother    Diabetes type II Mother    Heart attack Mother 86   CAD Father    Heart attack Father 99   Hypertension Brother    Diabetes type II Brother    Heart attack Brother 30   Diabetes type II Sister     Social History   Socioeconomic History   Marital status: Single    Spouse name: Not on file   Number of children: Not on file   Years of education: Not on file   Highest education level: Not on file  Occupational History   Not on file  Tobacco Use   Smoking status: Some Days    Current packs/day: 0.50    Average packs/day: 0.5 packs/day for 20.0 years (10.0 ttl pk-yrs)    Types: Cigarettes   Smokeless tobacco: Never   Tobacco comments:    SMOKES 4 CIGS PER DAY  Vaping Use   Vaping status: Never Used  Substance and Sexual Activity   Alcohol use:  No    Alcohol/week: 0.0 standard drinks of alcohol   Drug use: Not Currently    Comment: hx heroin use, IV,  last used 2006 (currently on methadone)   Sexual activity: Not Currently  Other Topics Concern   Not on file  Social History Narrative   Not on file   Social Drivers of Health   Financial Resource Strain: Low Risk  (09/23/2022)  Overall Financial Resource Strain (CARDIA)    Difficulty of Paying Living Expenses: Not hard at all  Food Insecurity: No Food Insecurity (03/26/2023)   Hunger Vital Sign    Worried About Running Out of Food in the Last Year: Never true    Ran Out of Food in the Last Year: Never true  Transportation Needs: No Transportation Needs (03/26/2023)   PRAPARE - Administrator, Civil Service (Medical): No    Lack of Transportation (Non-Medical): No  Physical Activity: Insufficiently Active (09/23/2022)   Exercise Vital Sign    Days of Exercise per Week: 2 days    Minutes of Exercise per Session: 20 min  Stress: No Stress Concern Present (09/23/2022)   [employer] of Occupational Health - Occupational Stress Questionnaire    Feeling of Stress : Not at all  Social Connections: Moderately Isolated (09/23/2022)   Social Connection and Isolation Panel [NHANES]    Frequency of Communication with Friends and Family: Three times a week    Frequency of Social Gatherings with Friends and Family: Twice a week    Attends Religious Services: More than 4 times per year    Active Member of [employer] or Organizations: No    Attends Banker Meetings: Never    Marital Status: Never married  Intimate Partner Violence: Not At Risk (03/26/2023)   Humiliation, Afraid, Rape, and Kick questionnaire    Fear of Current or Ex-Partner: No    Emotionally Abused: No    Physically Abused: No    Sexually Abused: No    Review of Systems: ROS negative except for what is noted on the assessment and plan.  Vitals:   06/16/23 1015  BP: 132/66  Pulse: 77   Temp: 97.7 F (36.5 C)  TempSrc: Oral  SpO2: 99%  Weight: 131 lb 1.6 oz (59.5 kg)  Height: 5' 2.5" (1.588 m)   Physical Exam: Constitutional: well-appearing in no acute distress HENT: Erythema in left external auditory canal; white foreign body visible in ear canal Eyes: conjunctiva non-erythematous Neck: supple Cardiovascular: regular rate and rhythm, no m/r/g Pulmonary/Chest: normal work of breathing on room air, lungs clear to auscultation bilaterally Abdominal: soft, non-tender, non-distended MSK: normal bulk and tone Neurological: alert & oriented x 3, 5/5 strength in bilateral upper and lower extremities, normal gait Skin: warm and dry  Assessment & Plan:   Foreign body in left ear Patient presents with 10-day history of foreign body stuck in her left ear.  Patient notes that she was cleaning her ear when the tip of a Q-tip got stuck.  She tried to remove it with other items and ended up with 2 Q-tips and a cotton ball stuck.  She notes that it has not hurt but it has bled intermittently.  Denies any fever.  On examination, her ear was notable for chondritis with surrounding erythema.  We attempted to remove the foreign bodies in the office, but were unable to remove all of the foreign debris.  Will place urgent ENT referral and given Ciprodex course for 7 days. - Urgent ENT referral - Ciprodex 4 drops in left ear 2 times per day for 7 days  Patient seen with Dr. Bary Leriche, MD  [facility] Internal Medicine, PGY-1 Date 06/16/2023 Time 11:15 AM

## 2023-06-17 ENCOUNTER — Other Ambulatory Visit (HOSPITAL_COMMUNITY): Payer: Self-pay

## 2023-06-18 ENCOUNTER — Telehealth (INDEPENDENT_AMBULATORY_CARE_PROVIDER_SITE_OTHER): Payer: Self-pay | Admitting: Physician Assistant

## 2023-06-18 NOTE — Telephone Encounter (Signed)
 LVM to confirm appt & location [id number] afm

## 2023-06-19 ENCOUNTER — Encounter (INDEPENDENT_AMBULATORY_CARE_PROVIDER_SITE_OTHER): Payer: Self-pay

## 2023-06-19 ENCOUNTER — Ambulatory Visit (INDEPENDENT_AMBULATORY_CARE_PROVIDER_SITE_OTHER): Admitting: Physician Assistant

## 2023-06-19 DIAGNOSIS — H60503 Unspecified acute noninfective otitis externa, bilateral: Secondary | ICD-10-CM

## 2023-06-19 DIAGNOSIS — T162XXA Foreign body in left ear, initial encounter: Secondary | ICD-10-CM

## 2023-06-19 NOTE — Progress Notes (Signed)
 Dear Dr. Antony Contras, Here is my assessment for our mutual patient, Rose [name]. Thank you for allowing me the opportunity to care for your patient. Please do not hesitate to contact me should you have any other questions. Sincerely, Burna Forts PA-C  Otolaryngology Clinic Note Referring provider: Dr. Antony Contras HPI:  Rose [name] is a 68 y.o. female kindly referred by Dr. Antony Contras   The patient is a 68 year old female here for removal of foreign body.  The patient notes that she put a cotton Q-tip in her left ear several weeks ago.  She notes that normally she does this when it is dry but made it wet which made it fall off.  She denied any significant pain.  She does note she developed some swelling to the left external ear, she also developed swelling to the right external ear.  She went and saw her primary care provider who was unable to remove the foreign body, she was diagnosed with otitis externa and started on Ciprodex on 06/16/2023.  She notes this has significantly improved the swelling, she continues to have some itchiness in the bilateral ears.  She denies any changes to her hearing.  Prior to the foreign body she denied any longstanding issues of hearing deficits.  No history of trauma to the ears, no history recurrent ear infections.  She does note a surgical history of tonsillectomy.  H&N Surgery: Tonsillectomy  Personal or FHx of bleeding dz or anesthesia difficulty: no     Independent Review of Additional Tests or Records:  none   PMH/Meds/All/SocHx/FamHx/ROS:   Past Medical History:  Diagnosis Date   ACE inhibitor-aggravated angioedema, initial encounter 04/26/2016   Allergic rhinitis    Anxiety    Arthritis    Calculus of gallbladder without cholecystitis without obstruction 10/28/2018   Chronic back pain    CKD (chronic kidney disease), stage III (HCC)    folllowed by pcp   Coronary artery disease    Depression    Diabetic neuropathy (HCC)    Edema of lower  extremity 10/28/2018   Encounter for completion of form with patient 06/21/2019   Environmental allergies    Environmental allergies 12/28/2015   Fibromyalgia    Foot ulcer secondary to diabetes 06/21/2019   Frequency of urination    History of chronic bronchitis    History of drug abuse (HCC) states quit herion in 2006   History of hepatitis C last abd ultrasound in epic 11-10-2018;  last hepative panel in epic 11-30-2018 was normal   previously followed by [facility] (notes in care everywhere)-- dx 04/ 2015,  started harvonic treatment 07/ 2015   History of rib fracture 10/28/2018   HPV in female 06/11/2016   Hyperlipidemia associated with type 2 diabetes mellitus (HCC) 12/13/2015   Hypertension    followed by pcp  (nuclear stress test 01-15-2017 in epic,  showed low risk normal w/ nuclear ef 55%)   Laceration of right foot 03/04/2019   Lipoma of neck 07/16/2017   Seen by general surgery Dr Carolynne Edouard May 16 - plan for removal of 2cm sebaceous cyst.    Methadone maintenance therapy patient (HCC)    Mild asthma    followed by pcp   Nocturia    PAD (peripheral artery disease) (HCC)    followed by cardiology, dr Kirke Corin---  06-01-2019  s/p bilateral CIA angioplasty stenting   Recurrent falls 04/30/2016   Right foot ulcer (HCC)    Symptomatic anemia 11/15/2020   Tendonitis  bilateral wrist   Type 2 diabetes mellitus (HCC)    followed by pcp   (06-16-2019  per pt checks blood sugar every other day in am,  fasting sugar 92-95)   Urge urinary incontinence      Past Surgical History:  Procedure Laterality Date   ABDOMINAL AORTOGRAM W/LOWER EXTREMITY Bilateral 06/01/2019   Procedure: ABDOMINAL AORTOGRAM W/LOWER EXTREMITY;  Surgeon: [name], MD;  Location: [facility];  Service: Cardiovascular;  Laterality: Bilateral;   ABDOMINAL AORTOGRAM W/LOWER EXTREMITY N/A 06/04/2022   Procedure: ABDOMINAL AORTOGRAM W/LOWER EXTREMITY;  Surgeon: [name], MD;   Location: [facility];  Service: Cardiovascular;  Laterality: N/A;   AMPUTATION TOE Right 03/23/2023   Procedure: AMPUTATION TOE;  Surgeon: Pilar Plate, DPM;  Location: [facility] OR;  Service: Orthopedics/Podiatry;  Laterality: Right;  Right great toe amputation   APPENDECTOMY  1972   BREAST BIOPSY Right 09/24/2022   MM RT BREAST BX W LOC DEV 1ST LESION IMAGE BX SPEC STEREO GUIDE 09/24/2022 [facility] M[facility]MO[facility]   INTERSTIM IMPLANT PLACEMENT  10/23/2011   Procedure: Leane Platt IMPLANT FIRST STAGE;  Surgeon: Martina Sinner, MD;  Location: [facility];  Service: Urology;  Laterality: N/A;   INTERSTIM IMPLANT PLACEMENT  10/23/2011   Procedure: Leane Platt IMPLANT SECOND STAGE;  Surgeon: Martina Sinner, MD;  Location: [facility];  Service: Urology;  Laterality: N/A;   LEFT HEART CATH AND CORONARY ANGIOGRAPHY N/A 01/11/2020   Procedure: LEFT HEART CATH AND CORONARY ANGIOGRAPHY;  Surgeon: [name], MD;  Location: [facility];  Service: Cardiovascular;  Laterality: N/A;   PANACOS TISSUE GRAFT Right 06/24/2019   Procedure: Application of Skin Graft Substitute;  Surgeon: Park Liter, DPM;  Location: [facility];  Service: Podiatry;  Laterality: Right;   PERIPHERAL VASCULAR INTERVENTION Bilateral 06/01/2019   Procedure: PERIPHERAL VASCULAR INTERVENTION;  Surgeon: [name], MD;  Location: [facility];  Service: Cardiovascular;  Laterality: Bilateral;   WOUND DEBRIDEMENT Right 06/24/2019   Procedure: DEBRIDEMENT WOUND;  Surgeon: Park Liter, DPM;  Location: [facility];  Service: Podiatry;  Laterality: Right;   WOUND DEBRIDEMENT Right 08/24/2019   Procedure: DEBRIDEMENT RIGHT FOOT WOUND; APPLICATION OF SKIN GRAFT SUBSTITUTE;  Surgeon: Park Liter, DPM;  Location: [facility];  Service: Podiatry;  Laterality: Right;    Family History  Problem Relation Age of Onset   Hypertension  Mother    Diabetes type II Mother    Heart attack Mother 67   CAD Father    Heart attack Father 22   Hypertension Brother    Diabetes type II Brother    Heart attack Brother 26   Diabetes type II Sister      Social Connections: Moderately Isolated (09/23/2022)   Social Connection and Isolation Panel [NHANES]    Frequency of Communication with Friends and Family: Three times a week    Frequency of Social Gatherings with Friends and Family: Twice a week    Attends Religious Services: More than 4 times per year    Active Member of [employer] or Organizations: No    Attends Banker Meetings: Never    Marital Status: Never married      Current Outpatient Medications:    acetaminophen (TYLENOL) 500 MG tablet, Take 2 tablets (1,000 mg total) by mouth every 8 (eight) hours as needed for mild pain (pain score 1-3)., Disp: 30 tablet, Rfl: 0  albuterol (VENTOLIN HFA) 108 (90 Base) MCG/ACT inhaler, Inhale 2 puffs into the lungs every 6 (six) hours as needed. For wheezing, Disp: 1 each, Rfl: 2   ASPIRIN EC ADULT LOW DOSE 81 MG tablet, TAKE ONE TABLET (81 MG TOTAL) BY MOUTH DAILY AT 9AM. SWALLOW WHOLE., Disp: 30 tablet, Rfl: 11   atorvastatin (LIPITOR) 80 MG tablet, Take 1 tablet (80 mg total) by mouth daily., Disp: 30 tablet, Rfl: 11   carvedilol (COREG) 6.25 MG tablet, Take 1 tablet (6.25 mg total) by mouth 2 (two) times daily., Disp: 60 tablet, Rfl: 11   cetirizine (ZYRTEC) 10 MG tablet, Take 1 tablet (10 mg total) by mouth daily., Disp: 30 tablet, Rfl: 0   ciprofloxacin-dexamethasone (CIPRODEX) OTIC suspension, Place 4 drops into the left ear 2 (two) times daily for 7 days., Disp: 7.5 mL, Rfl: 0   citalopram (CELEXA) 20 MG tablet, Take 20 mg by mouth daily. (Patient not taking: Reported on 06/02/2023), Disp: , Rfl:    clopidogrel (PLAVIX) 75 MG tablet, Take 1 tablet (75 mg total) by mouth daily., Disp: 90 tablet, Rfl: 11   DULoxetine (CYMBALTA) 60 MG capsule, TAKE ONE CAPSULE (60MG  TOTAL)  BY MOUTH DAILY AT 9AM, Disp: 30 capsule, Rfl: 2   fluticasone (FLONASE) 50 MCG/ACT nasal spray, SHAKE LIQUID AND INSTILL ONE SPRAY IN EACH NOSTRIL EVERY DAY (Patient taking differently: Place 1 spray into both nostrils daily as needed for allergies.), Disp: 16 g, Rfl: 4   gabapentin (NEURONTIN) 100 MG capsule, Take 1 capsule (100 mg total) by mouth 3 (three) times daily., Disp: 270 capsule, Rfl: 0   hydrochlorothiazide (HYDRODIURIL) 25 MG tablet, Take 25 mg by mouth daily., Disp: , Rfl:    metformin (FORTAMET) 1000 MG (OSM) 24 hr tablet, Take 1,000 mg by mouth daily with breakfast., Disp: , Rfl:    methadone (DOLOPHINE) 10 MG/5ML solution, Take 116 mg by mouth daily., Disp: , Rfl:    Multiple Vitamins-Minerals (CENTRUM WOMEN) TABS, Take 1 tablet by mouth daily., Disp: , Rfl:    mupirocin ointment (BACTROBAN) 2 %, Apply 1 Application topically 2 (two) times daily for 21 days., Disp: 42 g, Rfl: 0   neomycin-polymyxin-hydrocortisone (CORTISPORIN) 3.5-10000-1 OTIC suspension, Place 4 drops into the left ear 3 (three) times daily for 7 days., Disp: 10 mL, Rfl: 0   nicotine (NICODERM CQ - DOSED IN MG/24 HOURS) 21 mg/24hr patch, Place 1 patch (21 mg total) onto the skin daily. (Patient not taking: Reported on 06/02/2023), Disp: 30 patch, Rfl: 1   oxyCODONE-acetaminophen (PERCOCET/ROXICET) 5-325 MG tablet, Take 1 tablet by mouth every 4 (four) hours as needed for up to 16 doses for severe pain (pain score 7-10). (Patient not taking: Reported on 06/02/2023), Disp: 16 tablet, Rfl: 0   senna (GERI-KOT) 8.6 MG tablet, TAKE TWO TABLETS BY MOUTH DAILY AT 9 am, Disp: 60 tablet, Rfl: 11   Sennosides (SENNA) 8.6 MG CAPS, Take 2 tablets by mouth daily. (Patient not taking: Reported on 06/02/2023), Disp: 60 capsule, Rfl: 2   SYNJARDY XR 12-998 MG TB24, TAKE ONE TABLET BY MOUTH DAILY AT 9AM, Disp: 60 tablet, Rfl: 11   Physical Exam:   LMP 07/31/2011   Pertinent Findings  CN II-XII intact Left external auditory canal with  some swelling, redness, and dry skin, no discharge, cotton noted in the EAC-once removed TM was intact with well pneumatized middle ear, no erythema, right external auditory canal with swelling, redness and dryness, no discharge, right TM intact with well pneumatized middle  ear Weber 512: equal Rinne 512: AC > BC b/l  Anterior rhinoscopy: Septum midline; bilateral inferior turbinates with no hypertrophy No lesions of oral cavity/oropharynx; dentition within normal limits, surgically absent tonsils No obviously palpable neck masses/lymphadenopathy/thyromegaly No respiratory distress or stridor    Seprately Identifiable Procedures:  Procedure: Bilateral ear microscopy and foreign body removal using microscope (CPT 69200) - Mod 25 Pre-procedure diagnosis: left Ear Foreign body Post-procedure diagnosis: same Indication: left Ear foreign body; given patient's otologic complaints and physical exam, bilateral otologic examination using microscope was performed and ear foreign body removed  Procedure: Patient was placed semi-recumbent. Both ear canals were examined using the microscope with findings above.Qtip cotton head was removed on left forceps with improvement in EAC examination and patency. Left: EAC was patent. TM was intact . Middle ear was aerated. Drainage: none Right: EAC was patent. TM was intact . Middle ear was aerated . Drainage: none Foreign body was removed. There was no trauma to the [facility]. Patient tolerated the procedure well.    Impression & Plans:  Isadora Delorey is a 68 y.o. female with the following   Left ear foreign body-  Q-tip was removed from the left external auditory canal, no signs of trauma to the TM.  I counseled on the appropriate use of Q-tips.  Otitis externa-  Patient presents with bilateral otitis externa.  Uncertain etiology as to why the right side as otitis externa as well.  Reviewing the chart it does sound like she had foreign bodies in the right  ear that were removed.  She was placed on Ciprodex she has not been taking this very long, encouraged her to continue taking, I would like to see her back in the office in 1 to 2 weeks for repeat evaluation or sooner as needed.  The patient verbalized understanding and agreement to today's plan.    - f/u 1-2 week follow up   Thank you for allowing me the opportunity to care for your patient. Please do not hesitate to contact me should you have any other questions.  Sincerely, Burna Forts PA-C [facility] Phone: [phone] Fax: [phone]  06/19/2023, 2:09 PM

## 2023-06-24 ENCOUNTER — Encounter: Payer: 59 | Admitting: Student

## 2023-06-27 NOTE — Progress Notes (Signed)
 Internal Medicine Clinic Attending  I was physically present during the key portions of the resident provided service and participated in the medical decision making of patient's management care. I reviewed pertinent patient test results.  The assessment, diagnosis, and plan were formulated together and I agree with the documentation in the resident's note.  Reymundo Poll, MD

## 2023-07-03 ENCOUNTER — Ambulatory Visit (INDEPENDENT_AMBULATORY_CARE_PROVIDER_SITE_OTHER): Admitting: Podiatry

## 2023-07-03 ENCOUNTER — Ambulatory Visit (INDEPENDENT_AMBULATORY_CARE_PROVIDER_SITE_OTHER): Admitting: Physician Assistant

## 2023-07-03 ENCOUNTER — Encounter: Payer: Self-pay | Admitting: Podiatry

## 2023-07-03 DIAGNOSIS — B351 Tinea unguium: Secondary | ICD-10-CM

## 2023-07-03 DIAGNOSIS — E1151 Type 2 diabetes mellitus with diabetic peripheral angiopathy without gangrene: Secondary | ICD-10-CM

## 2023-07-03 DIAGNOSIS — M79609 Pain in unspecified limb: Secondary | ICD-10-CM | POA: Diagnosis not present

## 2023-07-03 DIAGNOSIS — H6090 Unspecified otitis externa, unspecified ear: Secondary | ICD-10-CM

## 2023-07-03 DIAGNOSIS — L84 Corns and callosities: Secondary | ICD-10-CM

## 2023-07-03 DIAGNOSIS — H60501 Unspecified acute noninfective otitis externa, right ear: Secondary | ICD-10-CM

## 2023-07-03 NOTE — Progress Notes (Signed)
  Subjective:  Patient ID: Rose [name], female    DOB: [birth year],  MRN: [id number]  Chief Complaint  Patient presents with   Diabetic Ulcer    Date of surgery: 03/23/2023  Right first toe amputation with a level of the MPJ with Dr. Annamary Rummage "It's healed."     68 y.o. female presents with the above complaint. History confirmed with patient.  She has been dealing with superficial dehiscence at right first toe amputation site from surgery on 03/23/2023.  She feels that it is well-healed.  Patient presenting with pain related to dystrophic thickened elongated nails. Patient is unable to trim own nails related to nail dystrophy and/or mobility issues. Patient does have a history of T2DM. Patient does have callus present located at the right fifth met head lateral aspect, left posterior heel causing pain.   Objective:  Physical Exam: warm, capillary refill approximately 4 seconds to the digits, pedal hair growth absent nail exam onychomycosis of the toenails, onycholysis, and dystrophic nails protective sensation intact, DP and PT pulses diminished Left Foot:  Pain with palpation of nails due to elongation and dystrophic growth.  Left posterior heel preulcerative callus Right Foot: Pain with palpation of nails due to elongation and dystrophic growth.  Right fifth metatarsal head lateral aspect hyperkeratotic callus.  Hallux amputation site healed, site of dehiscence there is some loosely overlying callus without underlying ulceration at this point, well-healed.  Assessment:   1. Pain due to onychomycosis of nail   2. Type II diabetes mellitus with peripheral circulatory disorder [facility])      Plan:  Patient was evaluated and treated and all questions answered.  #Hyperkeratotic lesions/pre ulcerative calluses present left posterior heel, right lateral fifth metatarsal head All symptomatic hyperkeratoses x 2 separate lesions were safely debrided with a sterile #312 blade to  patient's level of comfort without incident. We discussed preventative and palliative care of these lesions including supportive and accommodative shoegear, padding, prefabricated and custom molded accommodative orthoses, use of a pumice stone and lotions/creams daily.  #Onychomycosis with pain  -Nails palliatively debrided as below. -Educated on self-care  Procedure: Nail Debridement Rationale: Pain Type of Debridement: manual, sharp debridement. Instrumentation: Nail nipper, rotary burr. Number of Nails: 9  Patient educated on diabetes. Discussed proper diabetic foot care and discussed risks and complications of disease. Educated patient in depth on reasons to return to the office immediately should he/she discover anything concerning or new on the feet. All questions answered. Discussed proper shoes as well.  Discussed need to protect heels and bony prominences from pressure ulceration.   Return in about 3 months (around 10/02/2023) for Diabetic Foot Care.         Bronwen Betters, DPM [facility] / [facility]

## 2023-07-08 ENCOUNTER — Encounter (INDEPENDENT_AMBULATORY_CARE_PROVIDER_SITE_OTHER): Payer: Self-pay | Admitting: Physician Assistant

## 2023-07-08 NOTE — Progress Notes (Signed)
 Dear Dr. Mickie Bail, Here is my assessment for our mutual patient, Rose [name]. Thank you for allowing me the opportunity to care for your patient. Please do not hesitate to contact me should you have any other questions. Sincerely, Burna Forts PA-C  Otolaryngology Clinic Note Referring provider: Dr. Mickie Bail HPI:  Rose [name] is a 68 y.o. female kindly referred by Dr. Mickie Bail   The patient is a 68 YO female seen in the office for follow up evaluation for otitis externa. She was last seen in the office on 06/19/2023 for otitis externa. Below is a recap of that office visit.   The patient is a 68 year old female here for removal of foreign body.  The patient notes that she put a cotton Q-tip in her left ear several weeks ago.  She notes that normally she does this when it is dry but made it wet which made it fall off.  She denied any significant pain.  She does note she developed some swelling to the left external ear, she also developed swelling to the right external ear.  She went and saw her primary care provider who was unable to remove the foreign body, she was diagnosed with otitis externa and started on Ciprodex on 06/16/2023.  She notes this has significantly improved the swelling, she continues to have some itchiness in the bilateral ears.  She denies any changes to her hearing.  Prior to the foreign body she denied any longstanding issues of hearing deficits.  No history of trauma to the ears, no history recurrent ear infections.  She does note a surgical history of tonsillectomy.   H&N Surgery: Tonsillectomy  Personal or FHx of bleeding dz or anesthesia difficulty: no     UPDATE 07/08/2023  Since her last office visit she has been using the otic drops in her left ear. She notes this has significantly helped. She notes similar symptoms on the right with drainage and swelling. No fever, no changes in hearing.   Independent Review of Additional Tests or Records:   none   PMH/Meds/All/SocHx/FamHx/ROS:   Past Medical History:  Diagnosis Date   ACE inhibitor-aggravated angioedema, initial encounter 04/26/2016   Allergic rhinitis    Anxiety    Arthritis    Calculus of gallbladder without cholecystitis without obstruction 10/28/2018   Chronic back pain    CKD (chronic kidney disease), stage III (HCC)    folllowed by pcp   Coronary artery disease    Depression    Diabetic neuropathy (HCC)    Edema of lower extremity 10/28/2018   Encounter for completion of form with patient 06/21/2019   Environmental allergies    Environmental allergies 12/28/2015   Fibromyalgia    Foot ulcer secondary to diabetes 06/21/2019   Frequency of urination    History of chronic bronchitis    History of drug abuse (HCC) states quit herion in 2006   History of hepatitis C last abd ultrasound in epic 11-10-2018;  last hepative panel in epic 11-30-2018 was normal   previously followed by [facility] (notes in care everywhere)-- dx 04/ 2015,  started harvonic treatment 07/ 2015   History of rib fracture 10/28/2018   HPV in female 06/11/2016   Hyperlipidemia associated with type 2 diabetes mellitus (HCC) 12/13/2015   Hypertension    followed by pcp  (nuclear stress test 01-15-2017 in epic,  showed low risk normal w/ nuclear ef 55%)   Laceration of right foot 03/04/2019   Lipoma of neck 07/16/2017  Seen by general surgery Dr Carolynne Edouard May 16 - plan for removal of 2cm sebaceous cyst.    Methadone maintenance therapy patient (HCC)    Mild asthma    followed by pcp   Nocturia    PAD (peripheral artery disease) (HCC)    followed by cardiology, dr Kirke Corin---  06-01-2019  s/p bilateral CIA angioplasty stenting   Recurrent falls 04/30/2016   Right foot ulcer (HCC)    Symptomatic anemia 11/15/2020   Tendonitis    bilateral wrist   Type 2 diabetes mellitus (HCC)    followed by pcp   (06-16-2019  per pt checks blood sugar every other day in am,  fasting sugar  92-95)   Urge urinary incontinence      Past Surgical History:  Procedure Laterality Date   ABDOMINAL AORTOGRAM W/LOWER EXTREMITY Bilateral 06/01/2019   Procedure: ABDOMINAL AORTOGRAM W/LOWER EXTREMITY;  Surgeon: [name], MD;  Location: [facility];  Service: Cardiovascular;  Laterality: Bilateral;   ABDOMINAL AORTOGRAM W/LOWER EXTREMITY N/A 06/04/2022   Procedure: ABDOMINAL AORTOGRAM W/LOWER EXTREMITY;  Surgeon: [name], MD;  Location: [facility];  Service: Cardiovascular;  Laterality: N/A;   AMPUTATION TOE Right 03/23/2023   Procedure: AMPUTATION TOE;  Surgeon: Pilar Plate, DPM;  Location: [facility];  Service: Orthopedics/Podiatry;  Laterality: Right;  Right great toe amputation   APPENDECTOMY  1972   BREAST BIOPSY Right 09/24/2022   MM RT BREAST BX W LOC DEV 1ST LESION IMAGE BX SPEC STEREO GUIDE 09/24/2022 [facility]   INTERSTIM IMPLANT PLACEMENT  10/23/2011   Procedure: Leane Platt IMPLANT FIRST STAGE;  Surgeon: Martina Sinner, MD;  Location: [facility];  Service: Urology;  Laterality: N/A;   INTERSTIM IMPLANT PLACEMENT  10/23/2011   Procedure: Leane Platt IMPLANT SECOND STAGE;  Surgeon: Martina Sinner, MD;  Location: [facility];  Service: Urology;  Laterality: N/A;   LEFT HEART CATH AND CORONARY ANGIOGRAPHY N/A 01/11/2020   Procedure: LEFT HEART CATH AND CORONARY ANGIOGRAPHY;  Surgeon: [name], MD;  Location: [facility];  Service: Cardiovascular;  Laterality: N/A;   PANACOS TISSUE GRAFT Right 06/24/2019   Procedure: Application of Skin Graft Substitute;  Surgeon: Park Liter, DPM;  Location: [facility] [place];  Service: Podiatry;  Laterality: Right;   PERIPHERAL VASCULAR INTERVENTION Bilateral 06/01/2019   Procedure: PERIPHERAL VASCULAR INTERVENTION;  Surgeon: [name], MD;  Location: [facility];  Service: Cardiovascular;  Laterality: Bilateral;   WOUND  DEBRIDEMENT Right 06/24/2019   Procedure: DEBRIDEMENT WOUND;  Surgeon: Park Liter, DPM;  Location: [facility] [place];  Service: Podiatry;  Laterality: Right;   WOUND DEBRIDEMENT Right 08/24/2019   Procedure: DEBRIDEMENT RIGHT FOOT WOUND; APPLICATION OF SKIN GRAFT SUBSTITUTE;  Surgeon: Park Liter, DPM;  Location: [facility] [place];  Service: Podiatry;  Laterality: Right;    Family History  Problem Relation Age of Onset   Hypertension Mother    Diabetes type II Mother    Heart attack Mother 16   CAD Father    Heart attack Father 62   Hypertension Brother    Diabetes type II Brother    Heart attack Brother 90   Diabetes type II Sister      Social Connections: Moderately Isolated (09/23/2022)   Social Connection and Isolation Panel [NHANES]    Frequency of Communication with Friends and Family: Three times a week    Frequency of Social Gatherings with Friends  and Family: Twice a week    Attends Religious Services: More than 4 times per year    Active Member of Clubs or Organizations: No    Attends Banker Meetings: Never    Marital Status: Never married      Current Outpatient Medications:    acetaminophen (TYLENOL) 500 MG tablet, Take 2 tablets (1,000 mg total) by mouth every 8 (eight) hours as needed for mild pain (pain score 1-3)., Disp: 30 tablet, Rfl: 0   albuterol (VENTOLIN HFA) 108 (90 Base) MCG/ACT inhaler, Inhale 2 puffs into the lungs every 6 (six) hours as needed. For wheezing, Disp: 1 each, Rfl: 2   ASPIRIN EC ADULT LOW DOSE 81 MG tablet, TAKE ONE TABLET (81 MG TOTAL) BY MOUTH DAILY AT 9AM. SWALLOW WHOLE., Disp: 30 tablet, Rfl: 11   atorvastatin (LIPITOR) 80 MG tablet, Take 1 tablet (80 mg total) by mouth daily., Disp: 30 tablet, Rfl: 11   carvedilol (COREG) 6.25 MG tablet, Take 1 tablet (6.25 mg total) by mouth 2 (two) times daily., Disp: 60 tablet, Rfl: 11   cetirizine (ZYRTEC) 10 MG tablet, Take 1 tablet (10 mg total) by mouth  daily., Disp: 30 tablet, Rfl: 0   citalopram (CELEXA) 20 MG tablet, Take 20 mg by mouth daily., Disp: , Rfl:    clopidogrel (PLAVIX) 75 MG tablet, Take 1 tablet (75 mg total) by mouth daily., Disp: 90 tablet, Rfl: 11   DULoxetine (CYMBALTA) 60 MG capsule, TAKE ONE CAPSULE (60MG  TOTAL) BY MOUTH DAILY AT 9AM, Disp: 30 capsule, Rfl: 2   fluticasone (FLONASE) 50 MCG/ACT nasal spray, SHAKE LIQUID AND INSTILL ONE SPRAY IN EACH NOSTRIL EVERY DAY (Patient taking differently: Place 1 spray into both nostrils daily as needed for allergies.), Disp: 16 g, Rfl: 4   gabapentin (NEURONTIN) 100 MG capsule, Take 1 capsule (100 mg total) by mouth 3 (three) times daily., Disp: 270 capsule, Rfl: 0   hydrochlorothiazide (HYDRODIURIL) 25 MG tablet, Take 25 mg by mouth daily., Disp: , Rfl:    metformin (FORTAMET) 1000 MG (OSM) 24 hr tablet, Take 1,000 mg by mouth daily with breakfast., Disp: , Rfl:    methadone (DOLOPHINE) 10 MG/5ML solution, Take 116 mg by mouth daily., Disp: , Rfl:    Multiple Vitamins-Minerals (CENTRUM WOMEN) TABS, Take 1 tablet by mouth daily., Disp: , Rfl:    neomycin-polymyxin-hydrocortisone (CORTISPORIN) 3.5-10000-1 OTIC suspension, Place 4 drops into the left ear 3 (three) times daily for 7 days., Disp: 10 mL, Rfl: 0   nicotine (NICODERM CQ - DOSED IN MG/24 HOURS) 21 mg/24hr patch, Place 1 patch (21 mg total) onto the skin daily., Disp: 30 patch, Rfl: 1   oxyCODONE-acetaminophen (PERCOCET/ROXICET) 5-325 MG tablet, Take 1 tablet by mouth every 4 (four) hours as needed for up to 16 doses for severe pain (pain score 7-10)., Disp: 16 tablet, Rfl: 0   senna (GERI-KOT) 8.6 MG tablet, TAKE TWO TABLETS BY MOUTH DAILY AT 9 am, Disp: 60 tablet, Rfl: 11   Sennosides (SENNA) 8.6 MG CAPS, Take 2 tablets by mouth daily., Disp: 60 capsule, Rfl: 2   SYNJARDY XR 12-998 MG TB24, TAKE ONE TABLET BY MOUTH DAILY AT 9AM, Disp: 60 tablet, Rfl: 11   Physical Exam:   LMP 07/31/2011   Pertinent Findings  CN II-XII  intact Right EAC with swelling and minimal discharge, TM intact with will pneumatized middle ear space. Left EAC with some minimal swelling, no discharge, TM intact with well pneumatized middle ear spaces Anterior rhinoscopy:  Septum midline No lesions of oral  No obviously palpable neck masses/lymphadenopathy/thyromegaly No respiratory distress or stridor  Seprately Identifiable Procedures:  None  Impression & Plans:  Rose [name] is a 68 y.o. female with the following   Otitis externa-  The patient has had resolution of infection on the left, she has not been using the drops on the right as recommended. Overall no severe symptoms. I would recommend she use the drops on the right side for 5-7 days. If she continues to have symptoms at that time I would like to see her in the office for follow up. She understood and agreed to today's plan.   - f/u in one week if still symptomatic, otherwise PRN   Thank you for allowing me the opportunity to care for your patient. Please do not hesitate to contact me should you have any other questions.  Sincerely, Burna Forts PA-C [facility] Phone: [phone] Fax: [phone]  07/08/2023, 1:29 PM

## 2023-07-20 ENCOUNTER — Other Ambulatory Visit: Payer: Self-pay | Admitting: Student

## 2023-07-20 NOTE — Telephone Encounter (Unsigned)
 Copied from CRM [id number]. Topic: Clinical - Medication Refill >> Jul 20, 2023  4:25 PM Rose [name] wrote: Most Recent Primary Care Visit:  Provider: Maxie Spaniel  Department: [facility]  Visit Type: OFFICE VISIT  Date: 06/16/2023  Medication: albuterol  (VENTOLIN  HFA) 108 (90 Base) MCG/ACT inhaler   Has the patient contacted their pharmacy? Yes (Agent: If no, request that the patient contact the pharmacy for the refill. If patient does not wish to contact the pharmacy document the reason why and proceed with request.) (Agent: If yes, when and what did the pharmacy advise?)  Is this the correct pharmacy for this prescription? Yes If no, delete pharmacy and type the correct one.  This is the patient's preferred pharmacy:   [facility] [place] [place] [address] [place] [address] Phone: [phone] Fax: [phone]  Has the prescription been filled recently? No  Is the patient out of the medication? Yes  Has the patient been seen for an appointment in the last year OR does the patient have an upcoming appointment? Yes  Can we respond through MyChart? Yes  Agent: Please be advised that Rx refills may take up to 3 business days. We ask that you follow-up with your pharmacy.

## 2023-07-24 ENCOUNTER — Other Ambulatory Visit: Payer: Self-pay | Admitting: Student

## 2023-07-24 DIAGNOSIS — G8929 Other chronic pain: Secondary | ICD-10-CM

## 2023-07-24 DIAGNOSIS — E1142 Type 2 diabetes mellitus with diabetic polyneuropathy: Secondary | ICD-10-CM

## 2023-08-05 ENCOUNTER — Ambulatory Visit: Payer: 59 | Admitting: Podiatry

## 2023-08-10 MED ORDER — ALBUTEROL SULFATE HFA 108 (90 BASE) MCG/ACT IN AERS: 2.0000 | INHALATION_SPRAY | Freq: Four times a day (QID) | RESPIRATORY_TRACT | 2 refills | Status: AC | PRN

## 2023-08-19 ENCOUNTER — Other Ambulatory Visit: Payer: Self-pay | Admitting: Student

## 2023-08-19 NOTE — Telephone Encounter (Signed)
 Medication was sent 08/10/23 with 2 refills.

## 2023-08-22 ENCOUNTER — Other Ambulatory Visit: Payer: Self-pay | Admitting: Student

## 2023-08-22 DIAGNOSIS — G609 Hereditary and idiopathic neuropathy, unspecified: Secondary | ICD-10-CM

## 2023-08-29 ENCOUNTER — Other Ambulatory Visit: Payer: Self-pay | Admitting: Student

## 2023-08-29 DIAGNOSIS — G609 Hereditary and idiopathic neuropathy, unspecified: Secondary | ICD-10-CM

## 2023-08-31 NOTE — Telephone Encounter (Signed)
 The patient has been called with no answer.  IM has not sch a "Home Care" visit for this patient.  The patient will need to call back.  Copied from CRM [id number]. Topic: General - Call Back - No Documentation >> Aug 25, 2023 12:37 PM Brynn Caras wrote: Reason for CRM: The patient states she missed a call from the office to schedule a "home care" visit. Advised there is no current documentation shown for this missed call she received. She would like to confirm if she is able to still be scheduled? Callback #[phone]

## 2023-08-31 NOTE — Telephone Encounter (Signed)
 Medication refilled 08/25/23

## 2023-09-07 ENCOUNTER — Ambulatory Visit

## 2023-09-09 ENCOUNTER — Telehealth: Payer: Self-pay

## 2023-09-09 NOTE — Telephone Encounter (Signed)
 Patient was identified as falling into the [place] Measure - Diabetes.   Patient was: Appointment scheduled with primary care provider in the next 30 days.

## 2023-09-18 ENCOUNTER — Ambulatory Visit
Admission: RE | Admit: 2023-09-18 | Discharge: 2023-09-18 | Disposition: A | Discharge status: Home / Self Care | Source: Ambulatory Visit | Attending: Internal Medicine | Admitting: Internal Medicine

## 2023-09-18 DIAGNOSIS — Z1231 Encounter for screening mammogram for malignant neoplasm of breast: Secondary | ICD-10-CM | POA: Diagnosis not present

## 2023-09-21 ENCOUNTER — Telehealth: Payer: Self-pay | Admitting: *Deleted

## 2023-09-21 NOTE — Telephone Encounter (Signed)
 Called patient -lvm regarding getting her MM RT with BX rescheduled with the breast center(appt was 09-07-23 @ 12:45-canceled) waiting for call back so we can ger this rescheduled -patient can call the breast center by dialing [phone] to reschedule.

## 2023-09-29 ENCOUNTER — Other Ambulatory Visit: Payer: Self-pay | Admitting: Student

## 2023-09-29 ENCOUNTER — Encounter: Payer: Self-pay | Admitting: Student

## 2023-09-29 ENCOUNTER — Ambulatory Visit: Admitting: Student

## 2023-09-29 VITALS — BP 167/73 | HR 67 | Temp 98.5°F | Ht 62.5 in | Wt 128.4 lb

## 2023-09-29 DIAGNOSIS — E118 Type 2 diabetes mellitus with unspecified complications: Secondary | ICD-10-CM

## 2023-09-29 DIAGNOSIS — Z7984 Long term (current) use of oral hypoglycemic drugs: Secondary | ICD-10-CM

## 2023-09-29 DIAGNOSIS — F1120 Opioid dependence, uncomplicated: Secondary | ICD-10-CM | POA: Diagnosis not present

## 2023-09-29 DIAGNOSIS — R21 Rash and other nonspecific skin eruption: Secondary | ICD-10-CM

## 2023-09-29 DIAGNOSIS — I10 Essential (primary) hypertension: Secondary | ICD-10-CM | POA: Diagnosis not present

## 2023-09-29 MED ORDER — HYDROCHLOROTHIAZIDE 25 MG PO TABS: 25.0000 mg | ORAL_TABLET | Freq: Every day | ORAL | 3 refills | Status: AC

## 2023-09-29 MED ORDER — CARVEDILOL 6.25 MG PO TABS: 6.2500 mg | ORAL_TABLET | Freq: Two times a day (BID) | ORAL | 11 refills | Status: AC

## 2023-09-29 NOTE — Assessment & Plan Note (Signed)
 Patient continued to endorse great adherence to her maintenance methadone  therapy.  Tolerating it well and has no active concerns at this time.

## 2023-09-29 NOTE — Assessment & Plan Note (Signed)
 The patient presented to the office with a rash lasting approximately three weeks. She reports that the rash was initially itchy, but the itching has since resolved. On examination, there is a flat, reddish rash--please refer to the physical exam section above for an image. She denies recent travel outside [place]  and has not been hiking recently. However, she does spend time in a wooded area and believes she may need to replace her carpet. These factors raise concern for possible mosquito bites or bedbug exposure. I plan to initiate treatment with a low-potency hydrocortisone cream at this time. - Recommend Hydrocortisone 0.1%

## 2023-09-29 NOTE — Progress Notes (Signed)
 Internal Medicine Clinic Attending  Case discussed with the resident at the time of the visit.  We reviewed the resident's history and exam and pertinent patient test results.  I agree with the assessment, diagnosis, and plan of care documented in the resident's note.

## 2023-09-29 NOTE — Assessment & Plan Note (Signed)
 Lab Results  Component Value Date   HGBA1C 6.1 (A) 05/27/2023   HGBA1C 6.7 (A) 02/17/2023   HGBA1C 6.6 (A) 09/23/2022   Rose [name]'s diabetes is well-controlled on Synjardy  10-100 mg.  - Continue current regimen

## 2023-09-29 NOTE — Progress Notes (Signed)
 CC:  I think I have some insect bites  HPI:  [name] is a 68 year old female with the medical history outlined below, presenting today with a rash on her right hand. She reports the rash has been present for approximately two weeks and was initially itchy but is no longer pruritic. Please refer to the problem-based assessment and plan for further details.  Past Medical History:  Diagnosis Date   ACE inhibitor-aggravated angioedema, initial encounter 04/26/2016   Allergic rhinitis    Anxiety    Arthritis    Calculus of gallbladder without cholecystitis without obstruction 10/28/2018   Chronic back pain    CKD (chronic kidney disease), stage III (HCC)    folllowed by pcp   Coronary artery disease    Depression    Diabetic neuropathy (HCC)    Edema of lower extremity 10/28/2018   Encounter for completion of form with patient 06/21/2019   Environmental allergies    Environmental allergies 12/28/2015   Fibromyalgia    Foot ulcer secondary to diabetes 06/21/2019   Frequency of urination    History of chronic bronchitis    History of drug abuse (HCC) states quit herion in 2006   History of hepatitis C last abd ultrasound in epic 11-10-2018;  last hepative panel in epic 11-30-2018 was normal   previously followed by [facility] (notes in care everywhere)-- dx 04/ 2015,  started harvonic treatment 07/ 2015   History of rib fracture 10/28/2018   HPV in female 06/11/2016   Hyperlipidemia associated with type 2 diabetes mellitus (HCC) 12/13/2015   Hypertension    followed by pcp  (nuclear stress test 01-15-2017 in epic,  showed low risk normal w/ nuclear ef 55%)   Laceration of right foot 03/04/2019   Lipoma of neck 07/16/2017   Seen by general surgery Dr Curvin May 16 - plan for removal of 2cm sebaceous cyst.    Methadone  maintenance therapy patient (HCC)    Mild asthma    followed by pcp   Nocturia    PAD (peripheral artery disease) (HCC)     followed by cardiology, dr darron---  06-01-2019  s/p bilateral CIA angioplasty stenting   Recurrent falls 04/30/2016   Right foot ulcer (HCC)    Symptomatic anemia 11/15/2020   Tendonitis    bilateral wrist   Type 2 diabetes mellitus (HCC)    followed by pcp   (06-16-2019  per pt checks blood sugar every other day in am,  fasting sugar 92-95)   Urge urinary incontinence     Current Outpatient Medications on File Prior to Visit  Medication Sig Dispense Refill   albuterol  (VENTOLIN  HFA) 108 (90 Base) MCG/ACT inhaler Inhale 2 puffs into the lungs every 6 (six) hours as needed. For wheezing 1 each 2   ASPIRIN  EC ADULT LOW DOSE 81 MG tablet TAKE ONE TABLET (81 MG TOTAL) BY MOUTH DAILY AT 9AM. SWALLOW WHOLE. 30 tablet 11   atorvastatin  (LIPITOR ) 80 MG tablet Take 1 tablet (80 mg total) by mouth daily. 30 tablet 11   cetirizine  (ZYRTEC ) 10 MG tablet Take 1 tablet (10 mg total) by mouth daily. 30 tablet 0   citalopram  (CELEXA ) 20 MG tablet Take 20 mg by mouth daily.     clopidogrel  (PLAVIX ) 75 MG tablet Take 1 tablet (75 mg total) by mouth daily. 90 tablet 11   DULoxetine  (CYMBALTA ) 60 MG capsule TAKE ONE CAPSULE (60 MG TOTAL) BY MOUTH DAILY AT 9AM 30 capsule 3  fluticasone  (FLONASE ) 50 MCG/ACT nasal spray SHAKE LIQUID AND INSTILL ONE SPRAY IN EACH NOSTRIL EVERY DAY (Patient taking differently: Place 1 spray into both nostrils daily as needed for allergies.) 16 g 4   gabapentin  (NEURONTIN ) 100 MG capsule TAKE 1 CAPSULE(100 MG) BY MOUTH THREE TIMES DAILY 270 capsule 0   metformin  (FORTAMET ) 1000 MG (OSM) 24 hr tablet Take 1,000 mg by mouth daily with breakfast.     methadone  (DOLOPHINE ) 10 MG/5ML solution Take 116 mg by mouth daily.     Multiple Vitamins-Minerals (CENTRUM WOMEN) TABS Take 1 tablet by mouth daily.     neomycin -polymyxin-hydrocortisone (CORTISPORIN ) 3.5-10000-1 OTIC suspension Place 4 drops into the left ear 3 (three) times daily for 7 days. 10 mL 0   oxyCODONE -acetaminophen   (PERCOCET/ROXICET) 5-325 MG tablet Take 1 tablet by mouth every 4 (four) hours as needed for up to 16 doses for severe pain (pain score 7-10). 16 tablet 0   senna (GERI-KOT) 8.6 MG tablet TAKE TWO TABLETS BY MOUTH DAILY AT 9 am 60 tablet 11   Sennosides (SENNA) 8.6 MG CAPS Take 2 tablets by mouth daily. 60 capsule 2   SYNJARDY  XR 12-998 MG TB24 TAKE ONE TABLET BY MOUTH DAILY AT 9AM 60 tablet 11   No current facility-administered medications on file prior to visit.    Family History  Problem Relation Age of Onset   Hypertension Mother    Diabetes type II Mother    Heart attack Mother 43   CAD Father    Heart attack Father 3   Hypertension Brother    Diabetes type II Brother    Heart attack Brother 52   Diabetes type II Sister     Social History   Socioeconomic History   Marital status: Single    Spouse name: Not on file   Number of children: Not on file   Years of education: Not on file   Highest education level: Not on file  Occupational History   Not on file  Tobacco Use   Smoking status: Some Days    Current packs/day: 0.50    Average packs/day: 0.5 packs/day for 20.0 years (10.0 ttl pk-yrs)    Types: Cigarettes   Smokeless tobacco: Never   Tobacco comments:    SMOKES 4 CIGS PER DAY  Vaping Use   Vaping status: Never Used  Substance and Sexual Activity   Alcohol use: No    Alcohol/week: 0.0 standard drinks of alcohol   Drug use: Not Currently    Comment: hx heroin use, IV,  last used 2006 (currently on methadone )   Sexual activity: Not Currently  Other Topics Concern   Not on file  Social History Narrative   Not on file   Social Drivers of Health   Financial Resource Strain: Low Risk  (09/23/2022)   Overall Financial Resource Strain (CARDIA)    Difficulty of Paying Living Expenses: Not hard at all  Food Insecurity: No Food Insecurity (03/26/2023)   Hunger Vital Sign    Worried About Running Out of Food in the Last Year: Never true    Ran Out of Food in  the Last Year: Never true  Transportation Needs: No Transportation Needs (03/26/2023)   PRAPARE - Administrator, Civil Service (Medical): No    Lack of Transportation (Non-Medical): No  Physical Activity: Insufficiently Active (09/23/2022)   Exercise Vital Sign    Days of Exercise per Week: 2 days    Minutes of Exercise per Session: 20 min  Stress: No Stress Concern Present (09/23/2022)   [employer] of Occupational Health - Occupational Stress Questionnaire    Feeling of Stress : Not at all  Social Connections: Moderately Isolated (09/23/2022)   Social Connection and Isolation Panel    Frequency of Communication with Friends and Family: Three times a week    Frequency of Social Gatherings with Friends and Family: Twice a week    Attends Religious Services: More than 4 times per year    Active Member of [employer] or Organizations: No    Attends Banker Meetings: Never    Marital Status: Never married  Intimate Partner Violence: Not At Risk (03/26/2023)   Humiliation, Afraid, Rape, and Kick questionnaire    Fear of Current or Ex-Partner: No    Emotionally Abused: No    Physically Abused: No    Sexually Abused: No    Review of Systems: ROS negative except for what is noted on the assessment and plan.  Vitals:   09/29/23 1417 09/29/23 1419  BP: (!) 174/75 (!) 167/73  Pulse: 74 67  Temp: 98.5 F (36.9 C)   TempSrc: Oral   SpO2: 98%   Weight: 128 lb 6.4 oz (58.2 kg)   Height: 5' 2.5 (1.588 m)     Physical Exam: Constitutional: well-appearing woman, sitting in chair , in no acute distress Cardiovascular: regular rate and rhythm, no m/r/g Pulmonary/Chest: normal work of breathing on room air, lungs clear to auscultation bilaterally Neurological: alert & oriented x 3, no focal deficit Skin: warm and dry.   Psych: normal mood and behavior  Assessment & Plan:   Essential hypertension BP Readings from Last 3 Encounters:  09/29/23 (!) 167/73   06/16/23 132/66  06/02/23 130/66  The patient has a history of hypertension, currently managed with hydrochlorothiazide  25 mg and Coreg  6.25 mg daily. On examination today, she appeared comfortable breathing on room air without signs of distress. Her blood pressure was elevated at 174/75 mmHg; however, she mentioned rushing to the appointment, which may have contributed to the elevated reading. A repeat measurement showed a slight decrease to 167/73 mmHg. We will continue her current medication regimen and recheck her blood pressure at the next visit. If hypertension persists, adding a third antihypertensive agent will be considered. -Continue hydrochlorothiazide  25 mg -Continue Coreg  6.25 mg - Recommend ambulatory blood pressure check  Methadone  maintenance therapy patient [facility]) Patient continued to endorse great adherence to her maintenance methadone  therapy.  Tolerating it well and has no active concerns at this time.   Rash of both hands The patient presented to the office with a rash lasting approximately three weeks. She reports that the rash was initially itchy, but the itching has since resolved. On examination, there is a flat, reddish rash--please refer to the physical exam section above for an image. She denies recent travel outside [place]  and has not been hiking recently. However, she does spend time in a wooded area and believes she may need to replace her carpet. These factors raise concern for possible mosquito bites or bedbug exposure. I plan to initiate treatment with a low-potency hydrocortisone cream at this time. - Recommend Hydrocortisone 0.1%  Type 2 diabetes mellitus with complication, without long-term current use of insulin  (HCC) Lab Results  Component Value Date   HGBA1C 6.1 (A) 05/27/2023   HGBA1C 6.7 (A) 02/17/2023   HGBA1C 6.6 (A) 09/23/2022   Ms Kellett's diabetes is well-controlled on Synjardy  10-100 mg.  - Continue current regimen  Patient  discussed with Dr. CHARLENA Rosan Drue Renne, M.D [facility] Phone: [phone] Date 09/29/2023 Time 5:13 PM

## 2023-09-29 NOTE — Assessment & Plan Note (Signed)
 BP Readings from Last 3 Encounters:  09/29/23 (!) 167/73  06/16/23 132/66  06/02/23 130/66  The patient has a history of hypertension, currently managed with hydrochlorothiazide  25 mg and Coreg  6.25 mg daily. On examination today, she appeared comfortable breathing on room air without signs of distress. Her blood pressure was elevated at 174/75 mmHg; however, she mentioned rushing to the appointment, which may have contributed to the elevated reading. A repeat measurement showed a slight decrease to 167/73 mmHg. We will continue her current medication regimen and recheck her blood pressure at the next visit. If hypertension persists, adding a third antihypertensive agent will be considered. -Continue hydrochlorothiazide  25 mg -Continue Coreg  6.25 mg - Recommend ambulatory blood pressure check

## 2023-09-29 NOTE — Patient Instructions (Signed)
 Thank you, [name] for allowing us  to provide your care today. Today we discussed your  rash and your blood pressure. Please use hydrocortisone 0.1% from the pharmacy.  Please use these on your rash for the next week and see how you do.  Your blood pressure seems a little elevated in the office today appears to me you may have or about to run out of your hydrochlorothiazide .  I will refill those medications for you today. I have ordered the following labs for you:  Lab Orders  No laboratory test(s) ordered today     Tests ordered today:    Referrals ordered today:   Referral Orders  No referral(s) requested today     I have ordered the following medication/changed the following medications:   Stop the following medications: There are no discontinued medications.   Start the following medications: No orders of the defined types were placed in this encounter.    Follow up: one week    Remember: Please return for a BP check   Should you have any questions or concerns please call the internal medicine clinic at [phone].   Drue Lisa Grow MD 09/29/2023, 2:15 PM   [facility]

## 2023-10-01 ENCOUNTER — Other Ambulatory Visit: Payer: Self-pay | Admitting: Student

## 2023-10-07 ENCOUNTER — Other Ambulatory Visit: Payer: Self-pay | Admitting: Student

## 2023-10-07 DIAGNOSIS — I10 Essential (primary) hypertension: Secondary | ICD-10-CM

## 2023-10-07 MED ORDER — ATORVASTATIN CALCIUM 80 MG PO TABS: 80.0000 mg | ORAL_TABLET | Freq: Every day | ORAL | 3 refills | Status: AC

## 2023-10-07 NOTE — Telephone Encounter (Signed)
 Copied from CRM [id number]. Topic: Clinical - Medication Refill >> Oct 07, 2023  3:26 PM Cherylann S wrote: Medication: hydrochlorothiazide  (HYDRODIURIL ) 25 MG tablet  Has the patient contacted their pharmacy? Yes (Agent: If no, request that the patient contact the pharmacy for the refill. If patient does not wish to contact the pharmacy document the reason why and proceed with request.) (Agent: If yes, when and what did the pharmacy advise?)  This is the patient's preferred pharmacy:  [facility] [place] [address] [place] [address] Phone: [phone] Fax: [phone]  Is this the correct pharmacy for this prescription? Yes If no, delete pharmacy and type the correct one.   Has the prescription been filled recently? No  Is the patient out of the medication? No  Has the patient been seen for an appointment in the last year OR does the patient have an upcoming appointment? Yes  Can we respond through MyChart? Yes  Agent: Please be advised that Rx refills may take up to 3 business days. We ask that you follow-up with your pharmacy.

## 2023-10-20 ENCOUNTER — Encounter: Payer: Self-pay | Admitting: Podiatry

## 2023-10-20 ENCOUNTER — Ambulatory Visit (INDEPENDENT_AMBULATORY_CARE_PROVIDER_SITE_OTHER): Admitting: Podiatry

## 2023-10-20 DIAGNOSIS — Z89419 Acquired absence of unspecified great toe: Secondary | ICD-10-CM | POA: Diagnosis not present

## 2023-10-20 DIAGNOSIS — L84 Corns and callosities: Secondary | ICD-10-CM

## 2023-10-20 DIAGNOSIS — M79676 Pain in unspecified toe(s): Secondary | ICD-10-CM

## 2023-10-20 DIAGNOSIS — B351 Tinea unguium: Secondary | ICD-10-CM

## 2023-10-20 DIAGNOSIS — E1151 Type 2 diabetes mellitus with diabetic peripheral angiopathy without gangrene: Secondary | ICD-10-CM

## 2023-10-25 NOTE — Progress Notes (Signed)
 Subjective:  Patient ID: Rose [name], female    DOB: 02-05-1956,  MRN: [id number]  Rose [name] presents to clinic today for at risk foot care. Pt has h/o NIDDM and PAD with h/o amputation(s) of right great toe and callus(es) of both feet and painful mycotic toenails that are difficult to trim. Painful toenails interfere with ambulation. Aggravating factors include wearing enclosed shoe gear. Pain is relieved with periodic professional debridement. Painful calluses are aggravated when weightbearing with and without shoegear. Pain is relieved with periodic professional debridement.  Chief Complaint  Patient presents with   Diabetes    [facility] NIDDM A1C 6.1 Toenail trim. LOV with PCP 06/2023   New problem(s): None.   PCP is Renne Homans, MD.  Allergies  Allergen Reactions   Lisinopril  Swelling    Angioedema   Gabapentin  Hives and Other (See Comments)    Only when takes over 300 mg dose. gives me the shakes   Vicodin [Hydrocodone-Acetaminophen ] Hives, Diarrhea and Other (See Comments)    Stomach  cramps   Hydrocodone-Acetaminophen      Review of Systems: Negative except as noted in the HPI.  Objective: No changes noted in today's physical examination. There were no vitals filed for this visit. Rose [name] is a pleasant 68 y.o. female WD, WN in NAD. AAO x 3.  Vascular Examination: CFT <4 seconds b/l. DP pulses diminished b/l. PT pulses diminished b/l. Digital hair absent. Skin temperature gradient warm to cool b/l. No ischemia or gangrene. No cyanosis or clubbing noted b/l. No edema noted b/l LE.   Neurological Examination: Sensation grossly intact b/l with 10 gram monofilament.   Dermatological Examination: Pedal skin thin, shiny and atrophic b/l. No open wounds. No interdigital macerations.   Toenails left great toe and 2-5 b/l thick, discolored, elongated with subungual debris and pain on dorsal palpation.   Preulcerative lesion noted left  heel and 5th metatarsal head right foot. There is visible subdermal hemorrhage. There is no surrounding erythema, no edema, no drainage, no odor, no fluctuance.  Musculoskeletal Examination: Muscle strength 5/5 to all lower extremity muscle groups bilaterally. Lower extremity amputation(s): digital amputation R hallux.  Radiographs: None  Last A1c:      Latest Ref Rng & Units 05/27/2023   11:12 AM 02/17/2023   10:53 AM  Hemoglobin A1C  Hemoglobin-A1c 4.0 - 5.6 % 6.1  6.7    Assessment/Plan: 1. Pain due to onychomycosis of nail   2. Pre-ulcerative calluses   3. History of amputation of great toe (HCC)   4. Type II diabetes mellitus with peripheral circulatory disorder [facility])     Patient was evaluated and treated. All patient's and/or POA's questions/concerns addressed on today's visit. Toenails 1-5 debrided in length and girth without incident. Callus(es) left heel and 5th metatarsal head right foot pared with sharp debridement without incident. Continue daily foot inspections and monitor blood glucose per PCP/Endocrinologist's recommendations. Continue soft, supportive shoe gear daily. Report any pedal injuries to medical professional. Call office if there are any questions/concerns.  Return in about 3 months (around 01/20/2024).  Delon LITTIE Merlin, DPM      [facility] LOCATION: [address], [place] [address]  Office [phone]   [facility] LOCATION: [address] [place], [place] [address] Office [phone]

## 2023-11-09 ENCOUNTER — Other Ambulatory Visit: Payer: Self-pay | Admitting: Student

## 2023-11-09 DIAGNOSIS — I739 Peripheral vascular disease, unspecified: Secondary | ICD-10-CM

## 2023-11-09 NOTE — Telephone Encounter (Signed)
 Copied from CRM [id number]. Topic: Clinical - Medication Refill >> Nov 09, 2023  2:23 PM Marda MATSU wrote:   [name] with [facility] calling   Medication:   SYNJARDY  XR 12-998 MG TB24  clopidogrel  (PLAVIX ) 75 MG tablet   This is the patient's preferred pharmacy:    [facility] - [place] [address] [place] [address] Phone: [phone] Fax: [phone]    I

## 2023-11-23 ENCOUNTER — Other Ambulatory Visit: Payer: Self-pay | Admitting: Student

## 2023-11-23 DIAGNOSIS — G8929 Other chronic pain: Secondary | ICD-10-CM

## 2023-11-23 DIAGNOSIS — E1142 Type 2 diabetes mellitus with diabetic polyneuropathy: Secondary | ICD-10-CM

## 2023-12-01 ENCOUNTER — Encounter (HOSPITAL_COMMUNITY): Payer: Self-pay

## 2023-12-07 ENCOUNTER — Other Ambulatory Visit: Payer: Self-pay

## 2023-12-07 ENCOUNTER — Ambulatory Visit: Payer: Self-pay

## 2023-12-07 VITALS — BP 154/76 | HR 72 | Temp 99.1°F | Ht 62.5 in | Wt 135.2 lb

## 2023-12-07 DIAGNOSIS — I10 Essential (primary) hypertension: Secondary | ICD-10-CM

## 2023-12-07 DIAGNOSIS — F17210 Nicotine dependence, cigarettes, uncomplicated: Secondary | ICD-10-CM

## 2023-12-07 DIAGNOSIS — Z0000 Encounter for general adult medical examination without abnormal findings: Secondary | ICD-10-CM | POA: Diagnosis not present

## 2023-12-07 DIAGNOSIS — T65891A Toxic effect of other specified substances, accidental (unintentional), initial encounter: Secondary | ICD-10-CM | POA: Diagnosis not present

## 2023-12-07 DIAGNOSIS — Z79899 Other long term (current) drug therapy: Secondary | ICD-10-CM

## 2023-12-07 DIAGNOSIS — Z7984 Long term (current) use of oral hypoglycemic drugs: Secondary | ICD-10-CM

## 2023-12-07 DIAGNOSIS — E118 Type 2 diabetes mellitus with unspecified complications: Secondary | ICD-10-CM

## 2023-12-07 DIAGNOSIS — H608X3 Other otitis externa, bilateral: Secondary | ICD-10-CM | POA: Diagnosis not present

## 2023-12-07 DIAGNOSIS — Z23 Encounter for immunization: Secondary | ICD-10-CM

## 2023-12-07 LAB — GLUCOSE, CAPILLARY: Glucose-Capillary: 97 mg/dL (ref 70–99)

## 2023-12-07 LAB — POCT GLYCOSYLATED HEMOGLOBIN (HGB A1C): HbA1c, POC (controlled diabetic range): 5.9 % (ref 0.0–7.0)

## 2023-12-07 MED ORDER — CARBAMIDE PEROXIDE 6.5 % OT SOLN: 5.0000 [drp] | Freq: Two times a day (BID) | OTIC | 0 refills | Status: AC

## 2023-12-07 NOTE — Progress Notes (Signed)
 Established Patient Office Visit  Subjective   Patient ID: Rose [name], female    DOB: [birth year]  Age: 68 y.o. MRN: [id number]  Patient here for physical required by the insurance program she is completed with.  She currently has a stuffy nose, sore throat, and cough.  These symptoms have been present for about 5 days.  Other than the cold, patient is doing well.  Patient would like to not take so many medicines.        Objective:     BP (!) 154/76 (BP Location: Left Arm, Patient Position: Sitting, Cuff Size: Normal)   Pulse 72   Temp 99.1 F (37.3 C) (Oral)   Ht 5' 2.5 (1.588 m)   Wt 135 lb 3.2 oz (61.3 kg)   LMP 07/31/2011   SpO2 95%   BMI 24.33 kg/m  BP Readings from Last 3 Encounters:  12/07/23 (!) 154/76  09/29/23 (!) 167/73  06/16/23 132/66   Wt Readings from Last 3 Encounters:  12/07/23 135 lb 3.2 oz (61.3 kg)  09/29/23 128 lb 6.4 oz (58.2 kg)  06/16/23 131 lb 1.6 oz (59.5 kg)      Physical Exam Vitals reviewed.  Constitutional:      Appearance: Normal appearance.  HENT:     Head: Normocephalic and atraumatic.     Ears:     Comments: Chronic inflammatory change and flaking skin in bilateral external auditory canals    Nose: Congestion and rhinorrhea present.     Comments: Clear mucus Eyes:     Conjunctiva/sclera: Conjunctivae normal.  Cardiovascular:     Rate and Rhythm: Normal rate and regular rhythm.     Pulses: Normal pulses.     Heart sounds: Normal heart sounds.  Pulmonary:     Effort: Pulmonary effort is normal.     Comments: Upper airway crackles only Abdominal:     General: Bowel sounds are normal. There is no distension.     Palpations: Abdomen is soft.     Tenderness: There is no abdominal tenderness. There is no guarding.  Musculoskeletal:        General: Swelling present. No tenderness or deformity.     Comments: Mild bilateral lower leg swelling and hyperpigmentation Right great toe amputation She walks bent forward  at the waist and with a cane, ambulates well  Skin:    General: Skin is warm.  Neurological:     General: No focal deficit present.     Mental Status: She is alert and oriented to person, place, and time. Mental status is at baseline.  Psychiatric:        Mood and Affect: Mood normal.        Behavior: Behavior normal.      Results for orders placed or performed in visit on 12/07/23  Glucose, capillary  Result Value Ref Range   Glucose-Capillary 97 70 - 99 mg/dL  POC Hbg J8R  Result Value Ref Range   Hemoglobin A1C     HbA1c POC (<> result, manual entry)     HbA1c, POC (prediabetic range)     HbA1c, POC (controlled diabetic range) 5.9 0.0 - 7.0 %    Last hemoglobin A1c Lab Results  Component Value Date   HGBA1C 5.9 12/07/2023      The ASCVD Risk score (Arnett DK, et al., 2019) failed to calculate for the following reasons:   The valid total cholesterol range is 130 to 320 mg/dL    Assessment & Plan:  Assessment & Plan Type 2 diabetes mellitus with complication, without long-term current use of insulin  (HCC) Last A1c in February of this year was 6.1.  Patient takes empagliflozin  10 mg and metformin  1000 mg in the combination pill, daily.  A1c today 5.9.  Last eye exam was in January 2024 with Dr. Roz, recommended follow-up in 1 year.  Plan to continue current regimen and encourage patient to call her ophthalmologist as she is due for eye exam.  Please assess at next visit if she scheduled this appointment.  She is also due for diabetic foot exam in November of this year, please complete at next visit. Orders:   POC Hbg A1C  Need for immunization against influenza Patient was willing to get the flu vaccine today but due to time constraints and her transportation, patient had to leave before vaccine could be given.  Patient picking up her prescriptions at the [facility], recommended she get the vaccine then.  If she is not able to get it then she will be seen in our  clinic again in 4 weeks.  Follow-up if she was able to get the vaccine, if not, please administer vaccine at next visit.    Essential hypertension At last office visit in the beginning of July of this year blood pressure was 167/73.  Patient currently taking HCTZ 25 mg daily and carvedilol  6.25 mg daily.  Blood pressure today 164/75 and 154/76 on recheck.  Patient used to smoke 20 cigarettes a day, she was able to stop.  She started back again after her sister died and her brother was killed.  Patient is now smoking 10 cigarettes a day.  Discussed option to increase her blood pressure medications or have patient attempt to cut back on smoking.  Patient prefers trying to cut back on smoking before adding or changing her blood pressure medications.  Patient would like to be on less medications.  I support this patient's decision, we specifically talked about trying to cut back to 5 cigarettes or less by her appointment in 4 weeks.  With the goal of stopping all smoking.  Patient understands and in agreement.  Plan to continue current blood pressure regimen and reassess blood pressure at next office visit.  If pressures are still high, consider increasing carvedilol .  Patient had angioedema with lisinopril  thus not candidate for ACE inhibitor or ARB therapy.  Patient has peripheral arterial disease with mild swelling on exam today, not a great candidate for amlodipine either.  Patient has tried patches for smoking cessation in the past, but they were too expensive with her insurance.  I wonder if the 4-[facility] pharmacy has alternative options or more affordable options.  Reassess at next office visit and consider smoking cessation therapy.    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  She was then seen by an ENT and had a foreign body removed.  It was a  Q-tip cotton head.  ENT recommended continued Ciprodex  drops in both ears.  On exam today, bilateral external auditory canals are swollen with chronic induration and erythema.  Also flaking of the skin in bilateral ears most prominent on the right.  Patient has been putting hydrogen peroxide in her ears.  Plan recommended to have patient stop using the hydrogen peroxide and instead use carbamide peroxide drops twice daily for 14 days.  Patient understands.  Reassess  for improvement at next visit.  If symptoms persist, consider following up with previous ENT. Orders:   carbamide peroxide (DEBROX) 6.5 % OTIC solution; Place 5 drops into both ears 2 (two) times daily for 14 days.   Return in about 4 weeks (around 01/04/2024) for fu for lipid panel and BP check.    Viktoria King, DO

## 2023-12-07 NOTE — Assessment & Plan Note (Signed)
 Last A1c in February of this year was 6.1.  Patient takes empagliflozin  10 mg and metformin  1000 mg in the combination pill, daily.  A1c today 5.9.  Last eye exam was in January 2024 with Dr. Roz, recommended follow-up in 1 year.  Plan to continue current regimen and encourage patient to call her ophthalmologist as she is due for eye exam.  Please assess at next visit if she scheduled this appointment.  She is also due for diabetic foot exam in November of this year, please complete at next visit. Orders:   POC Hbg A1C

## 2023-12-07 NOTE — Assessment & Plan Note (Signed)
 At last office visit in the beginning of July of this year blood pressure was 167/73.  Patient currently taking HCTZ 25 mg daily and carvedilol  6.25 mg daily.  Blood pressure today 164/75 and 154/76 on recheck.  Patient used to smoke 20 cigarettes a day, she was able to stop.  She started back again after her sister died and her brother was killed.  Patient is now smoking 10 cigarettes a day.  Discussed option to increase her blood pressure medications or have patient attempt to cut back on smoking.  Patient prefers trying to cut back on smoking before adding or changing her blood pressure medications.  Patient would like to be on less medications.  I support this patient's decision, we specifically talked about trying to cut back to 5 cigarettes or less by her appointment in 4 weeks.  With the goal of stopping all smoking.  Patient understands and in agreement.  Plan to continue current blood pressure regimen and reassess blood pressure at next office visit.  If pressures are still high, consider increasing carvedilol .  Patient had angioedema with lisinopril  thus not candidate for ACE inhibitor or ARB therapy.  Patient has peripheral arterial disease with mild swelling on exam today, not a great candidate for amlodipine either.  Patient has tried patches for smoking cessation in the past, but they were too expensive with her insurance.  I wonder if the 4-[facility] pharmacy has alternative options or more affordable options.  Reassess at next office visit and consider smoking cessation therapy.

## 2023-12-07 NOTE — Patient Instructions (Addendum)
  It was wonderful seeing you today!   Please remember to...  1) Try to wean down on smoking with the goal to completely stop smoking. It will improve your health in every way, especially your blood pressure and arterial disease.   2) Stop putting hydrogen peroxide in your ears, instead put carbamide peroxide 5 drops, twice a day, for 14 days. If your ear pain worsens call your ENT: Chyrl Palmer RIGGERS [facility] Phone: [phone] Fax: [phone]  3) continue blowing your nose and coughing up mucus. Drink plenty of water, eat nutritious foods and sleep enough to get rid of your cold. If your symptoms worsen, call us .   4) You need to schedule an ophthalmology exam with Dr. Anselmo  And don't forget to get your flu shot before the end of October!  If you have any questions please feel free to the call the clinic at anytime at [phone].  Have a blessed day,  Dr. Charmayne

## 2023-12-08 NOTE — Progress Notes (Signed)
 Internal Medicine Clinic Attending  I was physically present during the key portions of the resident provided service and participated in the medical decision making of patient's management care. I reviewed pertinent patient test results.  The assessment, diagnosis, and plan were formulated together and I agree with the documentation in the resident's note.  Lovie Clarity, MD   My portion of the visit was a little limited due to time today.  On exam, patient has ear wax in both ears, but I could visualize TM. She has been using hydrogen peroxide, which we counseled against. Will try Debrox drops.   For her hypertension, I recommended increasing Carvedilol  to 12.5mg  BID, but also offered the option of working mainly on smoking cessation/reduction, with a close follow-up to see if blood pressure has improved. She will also check home blood pressure readings and bring those in. At next visit, if home and office BP's are still high, would increase regimen.

## 2023-12-21 NOTE — Addendum Note (Signed)
 Addended by: CHARMAYNE HOLMES on: 12/21/2023 09:01 AM   Modules accepted: Level of Service

## 2023-12-24 ENCOUNTER — Telehealth: Payer: Self-pay | Admitting: Student

## 2023-12-24 NOTE — Telephone Encounter (Signed)
 Unsure about a text that was sent as [facility] does not send text messages.  The pt is currently still scheduled for the following appointment:   ame: Rose, [name] MRN: [id number]  Date: 12/28/2023 Status: Sch  Time: 10:15 AM Length: 30  Visit Type: OFFICE VISIT [8002] Copay: $0.00  Provider: Tobie Gaines, DO     Copied from CRM [id number]. Topic: Appointments - Scheduling Inquiry for Clinic >> Dec 21, 2023 12:36 PM Alfonso ORN wrote: Reason for CRM: pt receved a text that need to call regarding her appt. 12/28/23 at 10:15 am that it expired please contact patient back not sure what the message mean

## 2023-12-28 ENCOUNTER — Ambulatory Visit (INDEPENDENT_AMBULATORY_CARE_PROVIDER_SITE_OTHER): Admitting: Student

## 2023-12-28 ENCOUNTER — Encounter: Payer: Self-pay | Admitting: Student

## 2023-12-28 ENCOUNTER — Telehealth: Payer: Self-pay

## 2023-12-28 VITALS — BP 150/69 | HR 69 | Temp 98.2°F | Ht 62.5 in | Wt 132.2 lb

## 2023-12-28 DIAGNOSIS — N1831 Chronic kidney disease, stage 3a: Secondary | ICD-10-CM

## 2023-12-28 DIAGNOSIS — R0981 Nasal congestion: Secondary | ICD-10-CM

## 2023-12-28 DIAGNOSIS — E118 Type 2 diabetes mellitus with unspecified complications: Secondary | ICD-10-CM

## 2023-12-28 DIAGNOSIS — I2510 Atherosclerotic heart disease of native coronary artery without angina pectoris: Secondary | ICD-10-CM

## 2023-12-28 DIAGNOSIS — Z23 Encounter for immunization: Secondary | ICD-10-CM

## 2023-12-28 DIAGNOSIS — F17200 Nicotine dependence, unspecified, uncomplicated: Secondary | ICD-10-CM | POA: Diagnosis not present

## 2023-12-28 DIAGNOSIS — I10 Essential (primary) hypertension: Secondary | ICD-10-CM

## 2023-12-28 DIAGNOSIS — F3289 Other specified depressive episodes: Secondary | ICD-10-CM

## 2023-12-28 NOTE — Assessment & Plan Note (Addendum)
 Patient currently on Coreg  6.25 mg twice daily.  Also on Plavix  75 mg daily.  There is some question if patient is on aspirin  or not.  She is unable to tell me.  She should only be on aspirin  per her cardiologist, will have thorough med reconciliation with pharmacist.  Patient is already on atorvastatin  80 mg daily.   Plan: - Continue Plavix  75 mg daily - Continue atorvastatin  80 mg daily - Med reconciliation pending with pharmacy

## 2023-12-28 NOTE — Telephone Encounter (Signed)
 Med review requested by Dr. Libby Blanch at [facility]. Patient unsure what she is taking. Attempted to outreach patient to schedule appointment. In-person would be preferred to review medication bottles, but can also review medications from home if needed.   LVM and will reach out via MyChart. Will forward to Care Guide to continue outreach attempts.   Lorain Baseman, PharmD [facility] [phone]

## 2023-12-28 NOTE — Assessment & Plan Note (Signed)
 Addressed tobacco use disorder today.  Patient is down to 5 cigarettes daily.  Offered Chantix , she politely declined.  Plan: - Continue addressing smoking cessation at following visit

## 2023-12-28 NOTE — Assessment & Plan Note (Signed)
 Check BMP today

## 2023-12-28 NOTE — Progress Notes (Addendum)
 CC: Physical   HPI:  [name] is a 68 y.o. female with a past medical history of hypertension, CAD status post stenting in 2021, type 2 diabetes, CKD stage IIIa who presents to the clinic for physical.  Please see assessment and plan for full HPI  Medication: Allergic rhinitis: Flonase  Chronic back pain: Cymbalta  60 mg daily, methadone  Hypertension: Coreg  6.25 mg twice daily, chlorothiazide 25 mg daily CAD: Plavix  75 mg daily, aspirin  81 mg daily Neuropathy: Gabapentin  100 mg 3 times daily Hyperlipidemia: Atorvastatin  80 mg daily Allergic rhinitis: Zyrtec  10 mg daily Depression Celexa  and cymbalta   Constipation: Senna Diabetes: Synjardy  12-998 mg twice daily  Past Medical History:  Diagnosis Date   ACE inhibitor-aggravated angioedema, initial encounter 04/26/2016   Allergic rhinitis    Anxiety    Arthritis    Calculus of gallbladder without cholecystitis without obstruction 10/28/2018   Chronic back pain    CKD (chronic kidney disease), stage III (HCC)    folllowed by pcp   Coronary artery disease    Depression    Diabetic neuropathy (HCC)    Edema of lower extremity 10/28/2018   Encounter for completion of form with patient 06/21/2019   Environmental allergies    Environmental allergies 12/28/2015   Fibromyalgia    Foot ulcer secondary to diabetes 06/21/2019   Frequency of urination    History of chronic bronchitis    History of drug abuse (HCC) states quit herion in 2006   History of hepatitis C last abd ultrasound in epic 11-10-2018;  last hepative panel in epic 11-30-2018 was normal   previously followed by [facility] (notes in care everywhere)-- dx 04/ 2015,  started harvonic treatment 07/ 2015   History of rib fracture 10/28/2018   HPV in female 06/11/2016   Hyperlipidemia associated with type 2 diabetes mellitus (HCC) 12/13/2015   Hypertension    followed by pcp  (nuclear stress test 01-15-2017 in epic,  showed low risk  normal w/ nuclear ef 55%)   Laceration of right foot 03/04/2019   Lipoma of neck 07/16/2017   Seen by general surgery Dr Curvin May 16 - plan for removal of 2cm sebaceous cyst.    Methadone  maintenance therapy patient    Mild asthma    followed by pcp   Nocturia    PAD (peripheral artery disease)    followed by cardiology, dr darron---  06-01-2019  s/p bilateral CIA angioplasty stenting   Recurrent falls 04/30/2016   Right foot ulcer (HCC)    Symptomatic anemia 11/15/2020   Tendonitis    bilateral wrist   Type 2 diabetes mellitus (HCC)    followed by pcp   (06-16-2019  per pt checks blood sugar every other day in am,  fasting sugar 92-95)   Urge urinary incontinence      Current Outpatient Medications:    albuterol  (VENTOLIN  HFA) 108 (90 Base) MCG/ACT inhaler, Inhale 2 puffs into the lungs every 6 (six) hours as needed. For wheezing, Disp: 1 each, Rfl: 2   ASPIRIN  EC ADULT LOW DOSE 81 MG tablet, TAKE ONE TABLET (81 MG TOTAL) BY MOUTH DAILY AT 9AM. SWALLOW WHOLE., Disp: 30 tablet, Rfl: 11   atorvastatin  (LIPITOR ) 80 MG tablet, Take 1 tablet (80 mg total) by mouth daily., Disp: 90 tablet, Rfl: 3   carvedilol  (COREG ) 6.25 MG tablet, Take 1 tablet (6.25 mg total) by mouth 2 (two) times daily., Disp: 60 tablet, Rfl: 11   cetirizine  (ZYRTEC ) 10 MG tablet, Take  1 tablet (10 mg total) by mouth daily., Disp: 30 tablet, Rfl: 0   citalopram  (CELEXA ) 20 MG tablet, Take 20 mg by mouth daily., Disp: , Rfl:    clopidogrel  (PLAVIX ) 75 MG tablet, TAKE ONE TABLET (75MG ) BY MOUTH DAILY AT 9AM WITH BREAKFAST, Disp: 90 tablet, Rfl: 11   DULoxetine  (CYMBALTA ) 60 MG capsule, TAKE ONE CAPSULE (60MG  TOTAL) BY MOUTH DAILY AT 9AM, Disp: 30 capsule, Rfl: 5   fluticasone  (FLONASE ) 50 MCG/ACT nasal spray, SHAKE LIQUID AND INSTILL ONE SPRAY IN EACH NOSTRIL EVERY DAY (Patient taking differently: Place 1 spray into both nostrils daily as needed for allergies.), Disp: 16 g, Rfl: 4   gabapentin  (NEURONTIN ) 100 MG capsule,  TAKE 1 CAPSULE(100 MG) BY MOUTH THREE TIMES DAILY, Disp: 270 capsule, Rfl: 0   hydrochlorothiazide  (HYDRODIURIL ) 25 MG tablet, Take 1 tablet (25 mg total) by mouth daily., Disp: 60 tablet, Rfl: 3   methadone  (DOLOPHINE ) 10 MG/5ML solution, Take 116 mg by mouth daily., Disp: , Rfl:    Multiple Vitamins-Minerals (CENTRUM WOMEN) TABS, Take 1 tablet by mouth daily., Disp: , Rfl:    neomycin -polymyxin-hydrocortisone (CORTISPORIN ) 3.5-10000-1 OTIC suspension, Place 4 drops into the left ear 3 (three) times daily for 7 days., Disp: 10 mL, Rfl: 0   senna (GERI-KOT) 8.6 MG tablet, TAKE TWO TABLETS BY MOUTH DAILY AT 9 am, Disp: 60 tablet, Rfl: 11   Sennosides (SENNA) 8.6 MG CAPS, Take 2 tablets by mouth daily., Disp: 60 capsule, Rfl: 2   SYNJARDY  XR 12-998 MG TB24, TAKE ONE TABLET BY MOUTH DAILY AT 9AM, Disp: 60 tablet, Rfl: 11  Review of Systems:    ENT: Patient endorses congestion   Physical Exam:  Vitals:   12/28/23 1012 12/28/23 1029  BP: (!) 183/72 (!) 150/69  Pulse: 77 69  Temp: 98.2 F (36.8 C)   TempSrc: Oral   SpO2: 95%   Weight: 132 lb 3.2 oz (60 kg)   Height: 5' 2.5 (1.588 m)    General: Patient is sitting comfortably in the room  Eyes: Pupils equal and reactive to light, EOM intact  Head: Normocephalic, atraumatic  ENT: Normal oropharynx, dry Ear canals, no cerumen appreciated, clear drainage Neck: Supple, nontender, No lymphadenopathy  Cardio: Regular rate and rhythm, no murmurs, rubs or gallops Pulmonary: Clear to ausculation bilaterally with no rales, rhonchi, and crackles  Abdomen: Soft, nontender with normoactive bowel sounds with no rebound or guarding  Neuro: Alert and orientated x3. CN II-XII intact. Sensation intact to upper and lower extremities. Skin: Dry skin noted to his bilateral Ear canals   Assessment & Plan:   Assessment & Plan Essential hypertension Patient has a past medical history of HTN. Her Blood pressure today is 150/69. She denies any chest pain,  shortness of breath, or vision changes.  Her medications include Coreg  6.25 mg BID and hydrochlorothiazide  25 mg daily.  Blood pressure today elevated into the 180s, on recheck it was 150s.  Unclear what medicines patient is taking at she is not able to tell me which ones.  Plan: - Follow-up with pharmacy for medication reconciliation - Continue Coreg  6.25 mg twice daily - Continue hydrochlorothiazide  25 mg daily - Follow-up 1 month - Follow up BMP  Type 2 diabetes mellitus with complication, without long-term current use of insulin  (HCC) No acute concerns at this time.  Check A1c a few weeks ago and it was 5.9.  Well-controlled at this time.  Plan: - Continue Synjardy  12-998 mg twice daily Flu vaccine need Flu  vaccine administered today  Tobacco use disorder Addressed tobacco use disorder today.  Patient is down to 5 cigarettes daily.  Offered Chantix , she politely declined.  Plan: - Continue addressing smoking cessation at following visit Nasal congestion Patient endorses 2-week history of nasal congestion.  Has tried Flonase  and cetirizine .  On my exam, does not look like infection.  Can treat conservatively.  Plan: - Continue Flonase  - Switch cetirizine  to another antihistamine - Follow-up symptoms in 1 month, if patient develops any systemic symptoms, patient instructed to call back Coronary artery disease involving native coronary artery of native heart without angina pectoris Patient currently on Coreg  6.25 mg twice daily.  Also on Plavix  75 mg daily.  There is some question if patient is on aspirin  or not.  She is unable to tell me.  She should only be on aspirin  per her cardiologist, will have thorough med reconciliation with pharmacist.  Patient is already on atorvastatin  80 mg daily.   Plan: - Continue Plavix  75 mg daily - Continue atorvastatin  80 mg daily - Med reconciliation pending with pharmacy CKD stage 3a, GFR 45-59 ml/min (HCC) Check BMP today Other  depression Patient feels fine emotionally today.  No acute concern for depression.  Medication list is slightly confusing.  She is on Cymbalta  60 mg daily, but also reports taking Celexa  20 mg daily.  Will have patient see pharmacist for med reconciliation as she should be on only 1 of these medication, I prefer Cymbalta .    Patient discussed with Dr. Ronnald Sergeant  Libby Blanch, DO Internal Medicine Resident PGY-3

## 2023-12-28 NOTE — Assessment & Plan Note (Addendum)
 Patient has a past medical history of HTN. Her Blood pressure today is 150/69. She denies any chest pain, shortness of breath, or vision changes.  Her medications include Coreg  6.25 mg BID and hydrochlorothiazide  25 mg daily.  Blood pressure today elevated into the 180s, on recheck it was 150s.  Unclear what medicines patient is taking at she is not able to tell me which ones.  Plan: - Follow-up with pharmacy for medication reconciliation - Continue Coreg  6.25 mg twice daily - Continue hydrochlorothiazide  25 mg daily - Follow-up 1 month - Follow up BMP

## 2023-12-28 NOTE — Assessment & Plan Note (Signed)
 No acute concerns at this time.  Check A1c a few weeks ago and it was 5.9.  Well-controlled at this time.  Plan: - Continue Synjardy  12-998 mg twice daily

## 2023-12-28 NOTE — Assessment & Plan Note (Addendum)
 Patient feels fine emotionally today.  No acute concern for depression.  Medication list is slightly confusing.  She is on Cymbalta  60 mg daily, but also reports taking Celexa  20 mg daily.  Will have patient see pharmacist for med reconciliation as she should be on only 1 of these medication, I prefer Cymbalta .

## 2023-12-28 NOTE — Patient Instructions (Addendum)
 Rose [name],Thank you for allowing me to take part in your care today.  Here are your instructions.  1. Regarding your medications, I am going to have a pharmacist call you to go over all of your medications   2. I am going to give you a flu vaccine today.  3. Regarding your sinus issues, please start doing Flonase  twice daily and switch to a different allergy medicine.   4. Please come back in 3 months   5. Please await phone call to schedule an appointment for an eye doctor   6. I am going to check your kidney function, I will call you with the results    PLEASE BRING YOUR MEDICATIONS TO EVERY APPOINTMENT  Thank you, Dr. Tobie  If you have any other questions please contact the internal medicine clinic at [phone] If it is after hours, please call the [place] [facility] at [phone] and then ask the person who picks up for the resident on call.

## 2023-12-29 ENCOUNTER — Telehealth: Payer: Self-pay | Admitting: *Deleted

## 2023-12-29 ENCOUNTER — Ambulatory Visit: Payer: Self-pay | Admitting: Student

## 2023-12-29 LAB — BASIC METABOLIC PANEL WITH GFR
BUN/Creatinine Ratio: 8 — ABNORMAL LOW (ref 12–28)
BUN: 11 mg/dL (ref 8–27)
CO2: 24 mmol/L (ref 20–29)
Calcium: 9.3 mg/dL (ref 8.7–10.3)
Chloride: 99 mmol/L (ref 96–106)
Creatinine, Ser: 1.41 mg/dL — ABNORMAL HIGH (ref 0.57–1.00)
Glucose: 76 mg/dL (ref 70–99)
Potassium: 4.1 mmol/L (ref 3.5–5.2)
Sodium: 139 mmol/L (ref 134–144)
eGFR: 41 mL/min/1.73 — ABNORMAL LOW (ref >59)

## 2023-12-29 NOTE — Progress Notes (Unsigned)
 Care Guide Pharmacy Note  12/29/2023 Name: Nevae Pinnix MRN: [id number] DOB: 24-May-1955  Referred By: Renne Homans, MD Reason for referral: Complex Care Management (Initial outreach to schedule referral with PharmD 1-2 weeks in office see note from Layna)   Dachelle Bitha Fauteux is a 68 y.o. year old female who is a primary care patient of Amoako, Prince, MD.  Trena Carilyn Seeds was referred to the pharmacist for assistance related to: HTN  An unsuccessful telephone outreach was attempted today to contact the patient who was referred to the pharmacy team for assistance with medication management. Additional attempts will be made to contact the patient.  Harlene Satterfield  [facility], [facility] Guide  Direct Dial: [phone]  Fax [phone]

## 2023-12-29 NOTE — Progress Notes (Signed)
 Internal Medicine Clinic Attending  Case discussed with the resident at the time of the visit.  We reviewed the resident's history and exam and pertinent patient test results.  I agree with the assessment, diagnosis, and plan of care documented in the resident's note.

## 2023-12-30 NOTE — Progress Notes (Signed)
 Care Guide Pharmacy Note  12/30/2023 Name: Rose [name] MRN: [id number] DOB: [birth year]  Referred By: Rose Homans, Rose [name] Reason for referral: Complex Care Management (Initial outreach to schedule referral with PharmD 1-2 weeks in office see note from Rose [name])   Rose [name] is a 68 y.o. year old female who is a primary care patient of Rose [name], Prince, Rose [name].  Rose [name] was referred to the pharmacist for assistance related to: HTN  Successful contact was made with the patient to discuss pharmacy services including being ready for the pharmacist to call at least 5 minutes before the scheduled appointment time and to have medication bottles and any blood pressure readings ready for review. The patient agreed to meet with the pharmacist via in office  on (date/time). 01/07/24 at 2:30 PM   Rose [name]  [facility], [facility] Guide  Direct Dial: [phone]  Fax [phone]

## 2024-01-06 ENCOUNTER — Ambulatory Visit: Admitting: Podiatry

## 2024-01-06 DIAGNOSIS — Z91199 Patient's noncompliance with other medical treatment and regimen due to unspecified reason: Secondary | ICD-10-CM

## 2024-01-06 NOTE — Progress Notes (Signed)
 No-show for appointment No-show #2.

## 2024-01-07 ENCOUNTER — Ambulatory Visit

## 2024-01-07 ENCOUNTER — Other Ambulatory Visit: Payer: Self-pay

## 2024-01-07 ENCOUNTER — Telehealth: Payer: Self-pay

## 2024-01-07 NOTE — Progress Notes (Deleted)
 01/07/2024 Name: Rose [name] MRN: [id number] DOB: 1955-12-20  No chief complaint on file.   Rose [name] is a 68 y.o. year old female who was referred for medication management by their primary care provider, Renne Homans, MD. They presented for a face to face visit today.   They were referred to the pharmacist by their PCP for assistance in managing complex medication management . PMH includes HTN, PAD s/p PCI with stent (2021), CAD s/p DES to LAD (2021), MASL, T2DM, CKD3, opioid-induced constipation, tobacco use.    Subjective: Patient was last seen by PCP, Libby Blanch, DO, on 12/28/23. At last visit, identified multiple medication discrepancies. Patient reported taking clopidogrel  and ASA (should only be on clopidogrel ), and had duloxetine  and citalopram  on her medication list. Her BP was elevated to 150/69 mmHg and she was unable to report which blood pressure medications she was taking.  Today, patient presents in *** good spirits and presents without *** any assistance. ***Patient is accompanied by ***.    Care Team: Primary Care Provider: Renne Homans, MD ; Next Scheduled Visit: 02/15/24  Medication Access/Adherence  Current Pharmacy:  [facility][id number] [place], [place] - [address] AT [facility] OF [facility] [address] [place] [address] Phone: [phone] Fax: [phone]  [facility] - [place], [place] - [address] [place] [address] Phone: [phone] Fax: [phone]  [facility] [place] - [place], [place] - [address] [place] [address] Phone: [phone] Fax: [phone]  [facility] #[id number] - [place], [place] - [address] AT [facility] OF [address] [place] [address] Phone: [phone] Fax: [phone]   Patient reports affordability concerns with their  medications: No  - UHC Dual Complete Patient reports access/transportation concerns to their pharmacy: {YES/NO:21197} Patient reports adherence concerns with their medications:  {YES/NO:21197} ***  *** Patient denies adherence with medications, reports missing *** medications *** times per week, on average.   Diabetes:  Current medications: Synjardy  XR (empagliflozin -metformin ) 12-998 mg once daily Medications tried in the past: ***  Current glucose readings: ***  Patient {Actions; denies-reports:120008} hypoglycemic s/sx including ***dizziness, shakiness, sweating. Patient {Actions; denies-reports:120008} hyperglycemic symptoms including ***polyuria, polydipsia, polyphagia, nocturia, neuropathy, blurred vision.  Current meal patterns:  - Breakfast: *** - Lunch *** - Supper *** - Snacks *** - Drinks ***  Current physical activity: ***  Current medication access support: ***  Macrovascular and Microvascular Risk Reduction:  Statin? {DM Statin Pharmacy:33491}; ACEi/ARB? {DM ACEi/ARB pharmacy:33492} Last urinary albumin/creatinine ratio:  Lab Results  Component Value Date   MICRALBCREAT 27 02/17/2023   MICRALBCREAT 30 (H) 10/14/2021   MICRALBCREAT 58 (H) 07/19/2020   MICRALBCREAT 23 09/08/2018   MICRALBCREAT 20.4 07/16/2017   MICRALBCREAT 35.9 (H) 06/05/2016   MICRALBCREAT 10.4 01/25/2015   Last eye exam:  Lab Results  Component Value Date   HMDIABEYEEXA No Retinopathy 04/30/2021   Last foot exam: 02/17/2023 Tobacco Use:  Tobacco Use: High Risk (12/28/2023)   Patient History    Smoking Tobacco Use: Some Days    Smokeless Tobacco Use: Never    Passive Exposure: Not on file   Hypertension:  Current medications: carvedilol  6.25 mg BID, hydrochlorothiazide  25 mg daily Medications previously tried: atenolol , lisinopril  (angioedema)  Patient {HAS/DOES NOT YJCZ:65250} a validated, automated, upper arm home BP cuff Current blood pressure readings readings:  ***  Patient {Actions; denies-reports:120008} hypotensive  s/sx including ***dizziness, lightheadedness.  Patient {Actions; denies-reports:120008} hypertensive symptoms including ***headache, chest pain, shortness of breath  Current meal patterns: ***  Current physical activity: ***   Hyperlipidemia/ASCVD Risk Reduction  Current lipid lowering medications: atorvastatin  80 mg daily Medications tried in the past:   Antiplatelet regimen: clopidogrel  75 mg daily, aspirin  81 mg daily ***  ASCVD History: PAD, CAD s/p DES Family History:  Risk Factors:   Current physical activity: ***  Current medication access support: ***  PREVENT Risk Score:  [email or web address] - 10 year risk of CVD: *** - 10 year risk of ASCVD: *** - 10 year risk of HF: ***  Medication Management:  Current adherence strategy: ***  Patient reports {Good Fair [phone]} adherence to medications  Patient reports the following barriers to adherence: ***  Using [facility]  Recent fill dates:  - Atorvastatin  80 mg - 12/16/23 for 30ds - Carvedilol  6.25 mg - 12/16/23 for 30ds - Clopidogrel  75 mg - 12/16/23 for 30ds - Duloxetine  60 mg - 12/16/23 for 30ds - Synjardy  12-998 mg - 12/16/23 for 30ds - Gabapentin  100 mg - 08/25/23 for 90ds - hydrochlorothiazide  25 mg - 12/16/23 for 30ds      Objective:  BP Readings from Last 3 Encounters:  12/28/23 (!) 150/69  12/07/23 (!) 154/76  09/29/23 (!) 167/73    Lab Results  Component Value Date   HGBA1C 5.9 12/07/2023   HGBA1C 6.1 (A) 05/27/2023   HGBA1C 6.7 (A) 02/17/2023       Latest Ref Rng & Units 12/28/2023   11:18 AM 03/27/2023    5:57 PM 03/24/2023    5:32 AM  BMP  Glucose 70 - 99 mg/dL 76  69  850   BUN 8 - 27 mg/dL 11  19  17    Creatinine 0.57 - 1.00 mg/dL 8.58  8.86  8.79   BUN/Creat Ratio 12 - 28 8     Sodium 134 - 144 mmol/L 139  136  135    Potassium 3.5 - 5.2 mmol/L 4.1  3.9  3.5   Chloride 96 - 106 mmol/L 99  103  102   CO2 20 - 29 mmol/L 24  22  25    Calcium  8.7 - 10.3 mg/dL 9.3  8.7  8.7    eGFR 41 mL/min (12/28/23)  Lab Results  Component Value Date   CHOL 98 (L) 12/10/2021   HDL 29 (L) 12/10/2021   LDLCALC 42 12/10/2021   TRIG 160 (H) 12/10/2021   CHOLHDL 3.4 12/10/2021    Medications Reviewed Today   Medications were not reviewed in this encounter       Assessment/Plan:   Medication Management: - Currently strategy {sufficient/insufficient:26424} to maintain appropriate adherence to prescribed medication regimen - Suggested use of weekly pill box to organize medications - Created list of medication, indication, and administration time. Provided to patient - Discussed collaboration with local pharmacies for adherence packaging. Reviewed local pharmacies with adherence packaging options. Patient elects to ***  Due for UACR and lipid panel at PCP appt on 02/15/24   Written patient instructions provided. Patient verbalized understanding of treatment plan.   Follow Up Plan: *** Pharmacist *** PCP clinic visit 02/15/24   Lorain Baseman, PharmD [facility] [phone]

## 2024-01-07 NOTE — Telephone Encounter (Signed)
 Attempted to contact patient for scheduled appointment for medication management. Left HIPAA compliant message for patient to return my call at their convenience.   Lorain Baseman, PharmD [facility] [phone]

## 2024-01-08 ENCOUNTER — Ambulatory Visit (HOSPITAL_BASED_OUTPATIENT_CLINIC_OR_DEPARTMENT_OTHER)
Admission: RE | Admit: 2024-01-08 | Discharge: 2024-01-08 | Disposition: A | Discharge status: Home / Self Care | Source: Ambulatory Visit | Attending: Cardiovascular Disease | Admitting: Cardiovascular Disease

## 2024-01-08 ENCOUNTER — Ambulatory Visit (HOSPITAL_COMMUNITY)
Admission: RE | Admit: 2024-01-08 | Discharge: 2024-01-08 | Disposition: A | Discharge status: Home / Self Care | Source: Ambulatory Visit | Attending: Cardiovascular Disease | Admitting: Cardiovascular Disease

## 2024-01-08 ENCOUNTER — Encounter (HOSPITAL_COMMUNITY): Payer: Self-pay

## 2024-01-08 DIAGNOSIS — I739 Peripheral vascular disease, unspecified: Secondary | ICD-10-CM | POA: Insufficient documentation

## 2024-01-09 LAB — VAS US ABI WITH/WO TBI
Left ABI: 0.92
Right ABI: 1.06

## 2024-01-11 ENCOUNTER — Ambulatory Visit: Payer: Self-pay | Admitting: Internal Medicine

## 2024-01-13 ENCOUNTER — Other Ambulatory Visit (HOSPITAL_COMMUNITY): Payer: Self-pay | Admitting: Internal Medicine

## 2024-01-13 DIAGNOSIS — I739 Peripheral vascular disease, unspecified: Secondary | ICD-10-CM

## 2024-01-13 NOTE — Telephone Encounter (Signed)
  Patient is returning call to get result

## 2024-01-13 NOTE — Telephone Encounter (Signed)
-----   Message from Deeanna GORMAN Frees sent at 01/13/2024 12:32 PM EDT -----

## 2024-01-13 NOTE — Telephone Encounter (Signed)
 call to VM and VM is full can't leave message   Results to MyChart but pt hasn't checked since April of this year

## 2024-01-14 ENCOUNTER — Ambulatory Visit (HOSPITAL_COMMUNITY): Admission: RE | Admit: 2024-01-14 | Source: Ambulatory Visit

## 2024-01-14 ENCOUNTER — Ambulatory Visit

## 2024-01-14 NOTE — Telephone Encounter (Signed)
 RS 01/14/24

## 2024-01-18 ENCOUNTER — Encounter: Payer: Self-pay | Admitting: Emergency Medicine

## 2024-01-18 ENCOUNTER — Telehealth: Payer: Self-pay

## 2024-01-18 NOTE — Progress Notes (Signed)
 Letter sent

## 2024-01-18 NOTE — Progress Notes (Signed)
 Complex Care Management Care Guide Note  01/18/2024 Name: Rose [name] MRN: [id number] DOB: 1955/07/28  Rose [name] is a 68 y.o. year old female who is a primary care patient of Renne Homans, MD and is actively engaged with the care management team. I reached out to Trena Carilyn Seeds by phone today to assist with re-scheduling  with the Pharmacist.  Follow up plan: Telephone appointment with complex care management team member scheduled for:  01/28/24 @ 2 PM  Leotis Rase [facility] Guide  Direct Dial: [phone]  Fax [phone]

## 2024-01-28 ENCOUNTER — Other Ambulatory Visit

## 2024-01-28 NOTE — Progress Notes (Deleted)
 01/28/2024 Name: Rose [name] MRN: [id number] DOB: Aug 23, 1955  No chief complaint on file.   Rose [name] is a 68 y.o. year old female who was referred for medication management by their primary care provider, Rose Homans, MD. They presented for a telephone visit today.   They were referred to the pharmacist by their PCP for assistance in managing complex medication management . PMH includes HTN, PAD s/p PCI with stent (2021), CAD s/p DES to LAD (2021), MASL, T2DM, CKD3, opioid-induced constipation, tobacco use.    Subjective: Patient was last seen by PCP, Rose Blanch, DO, on 12/28/23. At last visit, identified multiple medication discrepancies. Patient reported taking clopidogrel  and ASA (should only be on clopidogrel ), and had duloxetine  and citalopram  on her medication list. Her BP was elevated to 150/69 mmHg and she was unable to report which blood pressure medications she was taking.  Today, patient presents in *** good spirits and presents without *** any assistance. ***Patient is accompanied by ***.    Care Team: Primary Care Provider: Renne Homans, MD ; Next Scheduled Visit: 02/15/24  Medication Access/Adherence  Current Pharmacy:  [facility] #[id number] [place], [place] - [address] AT [facility] OF [facility] [address] [place] [address] Phone: [phone] Fax: [phone]  [facility] - [place], [place] - [address] [place] [address] Phone: [phone] Fax: [phone]  [facility] [place] - [place], [place] - [address] [place] [address] Phone: [phone] Fax: [phone]  [facility] #[id number] - , [place] - [address] AT [facility] OF [address] [place] [address] Phone: [phone] Fax: [phone]   Patient reports affordability concerns with their medications:  No  - UHC Dual Complete Patient reports access/transportation concerns to their pharmacy: {YES/NO:21197} Patient reports adherence concerns with their medications:  {YES/NO:21197} ***  *** Patient denies adherence with medications, reports missing *** medications *** times per week, on average.   Diabetes:  Current medications: Synjardy  XR (empagliflozin -metformin ) 12-998 mg once daily Medications tried in the past: ***  Current glucose readings: ***  Patient {Actions; denies-reports:120008} hypoglycemic s/sx including ***dizziness, shakiness, sweating. Patient {Actions; denies-reports:120008} hyperglycemic symptoms including ***polyuria, polydipsia, polyphagia, nocturia, neuropathy, blurred vision.  Current meal patterns:  - Breakfast: *** - Lunch *** - Supper *** - Snacks *** - Drinks ***  Current physical activity: ***  Current medication access support: ***  Macrovascular and Microvascular Risk Reduction:  Statin? {DM Statin Pharmacy:33491}; ACEi/ARB? {DM ACEi/ARB pharmacy:33492} Last urinary albumin/creatinine ratio:  Lab Results  Component Value Date   MICRALBCREAT 27 02/17/2023   MICRALBCREAT 30 (H) 10/14/2021   MICRALBCREAT 58 (H) 07/19/2020   MICRALBCREAT 23 09/08/2018   MICRALBCREAT 20.4 07/16/2017   MICRALBCREAT 35.9 (H) 06/05/2016   MICRALBCREAT 10.4 01/25/2015   Last eye exam:  Lab Results  Component Value Date   HMDIABEYEEXA No Retinopathy 04/30/2021   Last foot exam: 02/17/2023 Tobacco Use:  Tobacco Use: High Risk (12/28/2023)   Patient History    Smoking Tobacco Use: Some Days    Smokeless Tobacco Use: Never    Passive Exposure: Not on file   Hypertension:  Current medications: carvedilol  6.25 mg BID, hydrochlorothiazide  25 mg daily Medications previously tried: atenolol , lisinopril  (angioedema)  Patient {HAS/DOES NOT YJCZ:65250} a validated, automated, upper arm home BP cuff Current blood pressure readings readings: ***  Patient  {Actions; denies-reports:120008} hypotensive s/sx including ***  dizziness, lightheadedness.  Patient {Actions; denies-reports:120008} hypertensive symptoms including ***headache, chest pain, shortness of breath  Current meal patterns: ***  Current physical activity: ***   Hyperlipidemia/ASCVD Risk Reduction  Current lipid lowering medications: atorvastatin  80 mg daily Medications tried in the past:   Antiplatelet regimen: clopidogrel  75 mg daily, aspirin  81 mg daily ***  ASCVD History: PAD, CAD s/p DES Family History:  Risk Factors:   Current physical activity: ***  Current medication access support: ***  PREVENT Risk Score:  [email or web address] - 10 year risk of CVD: *** - 10 year risk of ASCVD: *** - 10 year risk of HF: ***  Medication Management:  Current adherence strategy: ***  Patient reports {Good Fair [phone]} adherence to medications  Patient reports the following barriers to adherence: ***  Using [facility]  Recent fill dates:  - Atorvastatin  80 mg - 12/16/23 for 30ds - Carvedilol  6.25 mg - 12/16/23 for 30ds - Clopidogrel  75 mg - 12/16/23 for 30ds - Duloxetine  60 mg - 12/16/23 for 30ds - Synjardy  12-998 mg - 12/16/23 for 30ds - Gabapentin  100 mg - 08/25/23 for 90ds - hydrochlorothiazide  25 mg - 12/16/23 for 30ds      Objective:  BP Readings from Last 3 Encounters:  12/28/23 (!) 150/69  12/07/23 (!) 154/76  09/29/23 (!) 167/73    Lab Results  Component Value Date   HGBA1C 5.9 12/07/2023   HGBA1C 6.1 (A) 05/27/2023   HGBA1C 6.7 (A) 02/17/2023       Latest Ref Rng & Units 12/28/2023   11:18 AM 03/27/2023    5:57 PM 03/24/2023    5:32 AM  BMP  Glucose 70 - 99 mg/dL 76  69  850   BUN 8 - 27 mg/dL 11  19  17    Creatinine 0.57 - 1.00 mg/dL 8.58  8.86  8.79   BUN/Creat Ratio 12 - 28 8     Sodium 134 - 144 mmol/L 139  136  135   Potassium 3.5 - 5.2  mmol/L 4.1  3.9  3.5   Chloride 96 - 106 mmol/L 99  103  102   CO2 20 - 29 mmol/L 24  22  25    Calcium  8.7 - 10.3 mg/dL 9.3  8.7  8.7    eGFR 41 mL/min (12/28/23)  Lab Results  Component Value Date   CHOL 98 (L) 12/10/2021   HDL 29 (L) 12/10/2021   LDLCALC 42 12/10/2021   TRIG 160 (H) 12/10/2021   CHOLHDL 3.4 12/10/2021    Medications Reviewed Today   Medications were not reviewed in this encounter       Assessment/Plan:   Medication Management: - Currently strategy {sufficient/insufficient:26424} to maintain appropriate adherence to prescribed medication regimen - Suggested use of weekly pill box to organize medications - Created list of medication, indication, and administration time. Provided to patient - Discussed collaboration with local pharmacies for adherence packaging. Reviewed local pharmacies with adherence packaging options. Patient elects to ***  Due for UACR and lipid panel at PCP appt on 02/15/24   Written patient instructions provided. Patient verbalized understanding of treatment plan.   Follow Up Plan: *** Pharmacist *** PCP clinic visit 02/15/24   Lorain Baseman, PharmD [facility] [phone]

## 2024-01-29 ENCOUNTER — Telehealth: Payer: Self-pay | Admitting: Student

## 2024-01-29 NOTE — Telephone Encounter (Signed)
 Please refer to message below.  Copied from CRM [id number]. Topic: Appointments - Appointment Cancel/Reschedule >> Jan 28, 2024  2:04 PM Diannia H wrote: Patient/patient representative is calling to cancel or reschedule an appointment for [facility] [place]. [facility] - [facility] PHARMACIST. Could you assist? Patients callback number is [phone].

## 2024-02-04 ENCOUNTER — Other Ambulatory Visit (INDEPENDENT_AMBULATORY_CARE_PROVIDER_SITE_OTHER)

## 2024-02-04 DIAGNOSIS — F17200 Nicotine dependence, unspecified, uncomplicated: Secondary | ICD-10-CM

## 2024-02-04 MED ORDER — VARENICLINE TARTRATE (STARTER) 0.5 MG X 11 & 1 MG X 42 PO TBPK: ORAL_TABLET | ORAL | 0 refills | Status: AC

## 2024-02-04 NOTE — Progress Notes (Signed)
 02/04/2024 Name: Rose [name] MRN: [id number] DOB: [birth year]  Chief Complaint  Patient presents with   Diabetes   Hyperlipidemia   Hypertension    Rose [name] is a 68 y.o. year old female who was referred for medication management by their primary care provider, Renne Homans, MD. They presented for a telephone visit today. We attempted to schedule a face-to-face appt multiple times, and the patient was never able to attend.   They were referred to the pharmacist by their PCP for assistance in managing complex medication management and medication reconciliation. PMH includes HTN, PAD s/p PCI with stent (2021), CAD s/p DES to LAD (2021), MASL, T2DM, CKD3, opioid-induced constipation, tobacco use.    Subjective: Patient was last seen by PCP, Libby Blanch, DO, on 12/28/23. At last visit, identified multiple medication discrepancies. Patient reported taking clopidogrel  and ASA (should only be on clopidogrel ), and had duloxetine  and citalopram  on her medication list. Her BP was elevated to 150/69 mmHg and she was unable to report which blood pressure medications she was taking.  Today, patient reports doing ok. She is at home and is able to review her medications with me. She has been getting her medications in pill packs from [facility] for about one year. She has the box with her and is able to read off what is included.   Care Team: Primary Care Provider: Renne Homans, MD ; Next Scheduled Visit: 02/15/24  Medication Access/Adherence  Current Pharmacy:  [id number] [place], [place] - [address] AT [facility] OF [facility] [address] & [address] [place] [address] Phone: [phone] Fax: [phone]  [facility] [place] - [place], [place] - [address] [place] [address] Phone: [phone] Fax: [phone]  [facility] [place] - [place], [place] - [address] [place] [address] Phone: [phone] Fax: [phone]  [facility] #[id number] - [place], [place] - [address] AT [facility] OF [address] & [address] [place] [address] Phone: [phone] Fax: [phone]   Patient reports affordability concerns with their medications: No  - UHC Dual Complete Patient reports access/transportation concerns to their pharmacy: No  - Uses a mail delivery pharmacy Patient reports adherence concerns with their medications:  No     Medication Management:  Current adherence strategy: Pill Packs via [facility] (has been using for the past year)  Patient reports Good adherence to medications - however, she does not know the names/indications of her medications  Recent fill dates:  Confirmed with [facility] that the following medications are in her pill pack:  - Atorvastatin  80 mg - 01/15/24 for 30ds - Carvedilol  6.25 mg - 01/15/24 for 30ds - Clopidogrel  75 mg - 01/15/24 for 30ds - Duloxetine  60 mg - 01/15/24 for 30ds - Synjardy  12-998 mg - 01/15/24 for 30ds - hydrochlorothiazide  25 mg - 01/15/24 for 30ds - Aspirin  81 mg daily - 01/19/24 for 30ds - this should be discontinued per cardiology Filled at [facility]:  - Gabapentin  100 mg - 08/25/23 for 90ds - reports she takes 1-3 times per day (not in pill pack)  Patient states that Paxil is in her pill packs - but this has not been dispensed from [facility] or any other pharmacy in her recent hx.  Hypertension:  Current medications: carvedilol  6.25 mg BID, hydrochlorothiazide  25 mg daily Medications previously tried: atenolol , lisinopril  (angioedema)  Patient has an automated, upper arm home BP  cuff - reports she checks occasionally Current blood pressure readings readings: 130s/60s, 120s/60s per patient memory  Patient denies hypotensive s/sx including dizziness, lightheadedness.  Patient denies hypertensive symptoms including headache, chest pain, shortness of  breath  Current meal patterns:  - Endorses water intake throughout the day (eats ice), also has cranberry juice, ginger ale - Reports she monitors salt, but says most of her food is NOT prepared at home. Examples of meals: hamburgers, [employer] bowl, cookout (no fries). Says she does eat salads that she makes herself  Current physical activity:  - Walks around her building every day  Hyperlipidemia/ASCVD Risk Reduction  Current lipid lowering medications: atorvastatin  80 mg daily Medications tried in the past:   Antiplatelet regimen: clopidogrel  75 mg daily, aspirin  81 mg daily has also been in her pill pack  ASCVD History: PAD, CAD s/p DES  Tobacco Abuse:  Quit Attempt History: Most recent quit attempt: reports she has quit 2 separate times in the past, but restarted due to life stressors (sister and brother passing). Methods tried in the past include Nicotine  Patch.  Motivators to quitting include cost, smell, health; barriers include fear of failing again and under a lot of stress now  Current smoking: 3 cigarettes per day on a good day, 8 on a bad day   Objective:  BP Readings from Last 3 Encounters:  12/28/23 (!) 150/69  12/07/23 (!) 154/76  09/29/23 (!) 167/73    Lab Results  Component Value Date   HGBA1C 5.9 12/07/2023   HGBA1C 6.1 (A) 05/27/2023   HGBA1C 6.7 (A) 02/17/2023       Latest Ref Rng & Units 12/28/2023   11:18 AM 03/27/2023    5:57 PM 03/24/2023    5:32 AM  BMP  Glucose 70 - 99 mg/dL 76  69  850   BUN 8 - 27 mg/dL 11  19  17    Creatinine 0.57 - 1.00 mg/dL 8.58  8.86  8.79   BUN/Creat Ratio 12 - 28 8     Sodium 134 - 144 mmol/L 139  136  135   Potassium 3.5 - 5.2 mmol/L 4.1  3.9  3.5   Chloride 96 - 106 mmol/L 99  103  102   CO2 20 - 29 mmol/L 24  22  25    Calcium  8.7 - 10.3 mg/dL 9.3  8.7  8.7    eGFR 41 mL/min (12/28/23)  Lab Results  Component Value Date   CHOL 98 (L) 12/10/2021   HDL 29 (L) 12/10/2021   LDLCALC 42 12/10/2021   TRIG 160  (H) 12/10/2021   CHOLHDL 3.4 12/10/2021    Medications Reviewed Today     Reviewed by Brinda Lorain SQUIBB, RPH (Pharmacist) on 02/04/24 at 1430  Med List Status: <None>   Medication Order Taking? Sig Documenting Provider Last Dose Status Informant  albuterol  (VENTOLIN  HFA) 108 (90 Base) MCG/ACT inhaler [id number]  Inhale 2 puffs into the lungs every 6 (six) hours as needed. For wheezing Tobie Gaines, DO  Active    Discontinued 02/04/24 1430 (Change in therapy)            Med Note>> Brinda Lorain SQUIBB, [facility]   02/04/2024  2:30 PM Clopidogrel  only    atorvastatin  (LIPITOR ) 80 MG tablet [id number] Yes Take 1 tablet (80 mg total) by mouth daily. Amoako, Prince, MD  Active   carvedilol  (COREG ) 6.25 MG tablet [id number] Yes Take 1 tablet (6.25 mg total) by mouth 2 (two) times daily. Renne Homans, MD  Active   cetirizine  (ZYRTEC ) 10 MG tablet [id number] Yes Take 1 tablet (10 mg total) by mouth daily. Darron Deatrice LABOR, MD  Active Self, Pharmacy Records    Discontinued 02/04/24 1318 (Change in therapy)          Med Note>> Brinda Lorain SQUIBB, [facility]   02/04/2024  1:18 PM on duloxetine     clopidogrel  (PLAVIX ) 75 MG tablet [id number] Yes TAKE ONE TABLET (75MG ) BY MOUTH DAILY AT 9AM WITH OFILIA Renne Homans, MD  Active   DULoxetine  (CYMBALTA ) 60 MG capsule [id number] Yes TAKE ONE CAPSULE (60MG  TOTAL) BY MOUTH DAILY AT CHANETTA Renne Homans, MD  Active   fluticasone  (FLONASE ) 50 MCG/ACT nasal spray [id number]  SHAKE LIQUID AND INSTILL ONE SPRAY IN EACH NOSTRIL EVERY DAY  Patient taking differently: Place 1 spray into both nostrils daily as needed for allergies.   Amoako, Prince, MD  Active Self, Pharmacy Records  gabapentin  (NEURONTIN ) 100 MG capsule [id number] Yes TAKE 1 CAPSULE(100 MG) BY MOUTH THREE TIMES DAILY Amoako, Prince, MD  Active   hydrochlorothiazide  (HYDRODIURIL ) 25 MG tablet [id number] Yes Take 1 tablet (25 mg total) by mouth daily. Amoako, Prince, MD  Active   methadone  (DOLOPHINE ) 10 MG/5ML solution [id number]  Take  116 mg by mouth daily. [provider]  Active Self, Pharmacy Records           Med Note LORNE, SHEFFIELD JAYSON Kitchens Mar 23, 2023  7:26 AM) [place] at methadone  clinic verified dose.   Multiple Vitamins-Minerals (CENTRUM WOMEN) TABS [id number]  Take 1 tablet by mouth daily. [provider]  Active Self, Pharmacy Records  neomycin -polymyxin-hydrocortisone (CORTISPORIN ) 3.5-10000-1 OTIC suspension [id number]  Place 4 drops into the left ear 3 (three) times daily for 7 days. Darral Longs, MD  Active Self, Pharmacy Records           Med Note LORNE, SHEFFIELD JAYSON Kitchens Mar 23, 2023  8:37 AM)    senna (GERI-KOT) 8.6 MG tablet [id number]  TAKE TWO TABLETS BY MOUTH DAILY AT 9 am Amoako, Prince, MD  Active   Sennosides (SENNA) 8.6 MG CAPS [id number]  Take 2 tablets by mouth daily. Kandis Perkins, DO  Active   SYNJARDY  XR 12-998 MG TB24 [id number] Yes TAKE ONE TABLET BY MOUTH DAILY AT CHANETTA Renne Homans, MD  Active   Med List Note Lorne Sheffield, CPhT 03/23/23 9273):  [facility] [phone]              Assessment/Plan:   Medication Management: - Currently strategy sufficient to maintain appropriate adherence to prescribed medication regimen. - Per most recent PCP note, requested to remove aspirin  81 mg daily from pill packs. She should continue antiplatelet monotherapy with clopidogrel  75 mg daily.  - Patient stated that Paxil (paroxetine) was included in her pill pack but there is no recent dispense hx for this and [facility] confirmed that this is not in her pack - Since this was a telephone appt, unable to visually assess patient's daily adherence to her medications. Encouraged her to bring pack with her to future visits.  Hypertension: - Currently uncontrolled with clinic BP consistently above goal less than 130/80, however patient recalls controlled home blood pressures. Patient is not having s/sx of hypo- or hyper-tension. Previously, amlodipine was not initiated due to  baseline LEE, but this may be a reasonable option if BP continue to be uncontrolled on current regimen. Also discussed smoking cessation today, as included below.  - Reviewed long term cardiovascular  and renal outcomes of uncontrolled blood pressure - Reviewed appropriate blood pressure monitoring technique and reviewed goal blood pressure. Recommended to check home blood pressure and heart rate  once daily and keep a log to bring to upcoming appointments - Recommend to continue carvedilol  6.25 mg BID, hydrochlorothiazide  25 mg daily - Due for UACR at PCP f/u   Hyperlipidemia/ASCVD Risk Reduction: - Currently controlled with most recent LDL-C of 42 mg/dL below goal < 55 mg/dL given premature ASCVD. High intensity statin indicated. - Reviewed long term complications of uncontrolled cholesterol - Recommend to continue atorvastatin  80  mg daily, clopidogrel  75 mg daily - Due for lipid panel at PCP f/u   Tobacco Abuse - Currently uncontrolled - smoking 3 to 8 cigarettes per day. She is interested in using NRT and Chantix . - Provided motivational interviewing to assess tobacco use and strategies for reduction - Provided information on 1 800 QUIT NOW support program - patient states she would try to call after our appt to request 12 weeks of free NRT (patches) to be mailed to her - Start varenicline  0.5 mg daily for 3 days, then 0.5 mg twice daily for 4 days, then 1 mg twice daily thereafter. Counseled to take with food to reduce risk of GI adverse effects. Also counseled to stop therapy and contact provider if change in mood. Will not be included in pill pack, but can transition to including in pill pack at a later date.   Patient verbalized understanding of treatment plan.   Follow Up Plan:  Pharmacist 02/15/24 PCP clinic visit 02/15/24   Lorain Baseman, PharmD [facility] [phone]

## 2024-02-15 ENCOUNTER — Ambulatory Visit: Admitting: Student

## 2024-02-22 ENCOUNTER — Emergency Department (HOSPITAL_COMMUNITY)
Admission: EM | Admit: 2024-02-22 | Discharge: 2024-02-22 | Discharge status: Against Medical Advice | Attending: Emergency Medicine | Admitting: Emergency Medicine

## 2024-02-22 ENCOUNTER — Encounter (HOSPITAL_COMMUNITY): Payer: Self-pay

## 2024-02-22 ENCOUNTER — Ambulatory Visit (INDEPENDENT_AMBULATORY_CARE_PROVIDER_SITE_OTHER): Admitting: Student

## 2024-02-22 VITALS — BP 137/61 | HR 80 | Temp 99.2°F | Ht 62.5 in | Wt 143.2 lb

## 2024-02-22 DIAGNOSIS — W19XXXA Unspecified fall, initial encounter: Secondary | ICD-10-CM

## 2024-02-22 DIAGNOSIS — Y92012 Bathroom of single-family (private) house as the place of occurrence of the external cause: Secondary | ICD-10-CM | POA: Diagnosis not present

## 2024-02-22 DIAGNOSIS — S4991XA Unspecified injury of right shoulder and upper arm, initial encounter: Secondary | ICD-10-CM | POA: Diagnosis not present

## 2024-02-22 DIAGNOSIS — M542 Cervicalgia: Secondary | ICD-10-CM | POA: Insufficient documentation

## 2024-02-22 DIAGNOSIS — W1812XA Fall from or off toilet with subsequent striking against object, initial encounter: Secondary | ICD-10-CM | POA: Diagnosis not present

## 2024-02-22 DIAGNOSIS — S0990XA Unspecified injury of head, initial encounter: Secondary | ICD-10-CM | POA: Diagnosis not present

## 2024-02-22 DIAGNOSIS — W1811XA Fall from or off toilet without subsequent striking against object, initial encounter: Secondary | ICD-10-CM | POA: Insufficient documentation

## 2024-02-22 DIAGNOSIS — Z5321 Procedure and treatment not carried out due to patient leaving prior to being seen by health care provider: Secondary | ICD-10-CM | POA: Diagnosis not present

## 2024-02-22 DIAGNOSIS — S4992XA Unspecified injury of left shoulder and upper arm, initial encounter: Secondary | ICD-10-CM | POA: Diagnosis present

## 2024-02-22 DIAGNOSIS — Z9189 Other specified personal risk factors, not elsewhere classified: Secondary | ICD-10-CM

## 2024-02-22 DIAGNOSIS — Z7902 Long term (current) use of antithrombotics/antiplatelets: Secondary | ICD-10-CM

## 2024-02-22 NOTE — ED Notes (Signed)
 Pt reports she needs to leave.  Her ride doesn't drive after dark.  Pt encouraged to stay.  Pt reports she will come back tomorrow.

## 2024-02-22 NOTE — ED Triage Notes (Signed)
 Pt sent from urgent care to get a CT head and cervical spine and xray of left shoulder, injured from fall 2 days ago

## 2024-02-22 NOTE — Progress Notes (Addendum)
 CC: Fall  HPI:  Rose.Rose [name] is a 68 y.o. female living with a history stated below and presents today for a fall last Wednesday . Please see problem based assessment and plan for additional details.  Past Medical History:  Diagnosis Date   ACE inhibitor-aggravated angioedema, initial encounter 04/26/2016   Allergic rhinitis    Anxiety    Arthritis    Calculus of gallbladder without cholecystitis without obstruction 10/28/2018   Chronic back pain    CKD (chronic kidney disease), stage III (HCC)    folllowed by pcp   Coronary artery disease    Depression    Diabetic neuropathy (HCC)    Edema of lower extremity 10/28/2018   Encounter for completion of form with patient 06/21/2019   Environmental allergies    Environmental allergies 12/28/2015   Fibromyalgia    Foot ulcer secondary to diabetes 06/21/2019   Frequency of urination    History of chronic bronchitis    History of drug abuse (HCC) states quit herion in 2006   History of hepatitis C last abd ultrasound in epic 11-10-2018;  last hepative panel in epic 11-30-2018 was normal   previously followed by [facility] (notes in care everywhere)-- dx 04/ 2015,  started harvonic treatment 07/ 2015   History of rib fracture 10/28/2018   HPV in female 06/11/2016   Hyperlipidemia associated with type 2 diabetes mellitus (HCC) 12/13/2015   Hypertension    followed by pcp  (nuclear stress test 01-15-2017 in epic,  showed low risk normal w/ nuclear ef 55%)   Laceration of right foot 03/04/2019   Lipoma of neck 07/16/2017   Seen by general surgery Dr Curvin May 16 - plan for removal of 2cm sebaceous cyst.    Methadone  maintenance therapy patient    Mild asthma    followed by pcp   Nocturia    PAD (peripheral artery disease)    followed by cardiology, dr darron---  06-01-2019  s/p bilateral CIA angioplasty stenting   Recurrent falls 04/30/2016   Right foot ulcer (HCC)    Symptomatic anemia 11/15/2020    Tendonitis    bilateral wrist   Type 2 diabetes mellitus (HCC)    followed by pcp   (06-16-2019  per pt checks blood sugar every other day in am,  fasting sugar 92-95)   Urge urinary incontinence     Current Outpatient Medications on File Prior to Visit  Medication Sig Dispense Refill   albuterol  (VENTOLIN  HFA) 108 (90 Base) MCG/ACT inhaler Inhale 2 puffs into the lungs every 6 (six) hours as needed. For wheezing 1 each 2   atorvastatin  (LIPITOR ) 80 MG tablet Take 1 tablet (80 mg total) by mouth daily. 90 tablet 3   carvedilol  (COREG ) 6.25 MG tablet Take 1 tablet (6.25 mg total) by mouth 2 (two) times daily. 60 tablet 11   cetirizine  (ZYRTEC ) 10 MG tablet Take 1 tablet (10 mg total) by mouth daily. 30 tablet 0   clopidogrel  (PLAVIX ) 75 MG tablet TAKE ONE TABLET (75MG ) BY MOUTH DAILY AT 9AM WITH BREAKFAST 90 tablet 11   DULoxetine  (CYMBALTA ) 60 MG capsule TAKE ONE CAPSULE (60MG  TOTAL) BY MOUTH DAILY AT 9AM 30 capsule 5   fluticasone  (FLONASE ) 50 MCG/ACT nasal spray SHAKE LIQUID AND INSTILL ONE SPRAY IN EACH NOSTRIL EVERY DAY (Patient taking differently: Place 1 spray into both nostrils daily as needed for allergies.) 16 g 4   gabapentin  (NEURONTIN ) 100 MG capsule TAKE 1 CAPSULE(100 MG) BY  MOUTH THREE TIMES DAILY 270 capsule 0   hydrochlorothiazide  (HYDRODIURIL ) 25 MG tablet Take 1 tablet (25 mg total) by mouth daily. 60 tablet 3   methadone  (DOLOPHINE ) 10 MG/5ML solution Take 116 mg by mouth daily.     Multiple Vitamins-Minerals (CENTRUM WOMEN) TABS Take 1 tablet by mouth daily.     neomycin -polymyxin-hydrocortisone (CORTISPORIN ) 3.5-10000-1 OTIC suspension Place 4 drops into the left ear 3 (three) times daily for 7 days. 10 mL 0   senna (GERI-KOT) 8.6 MG tablet TAKE TWO TABLETS BY MOUTH DAILY AT 9 am 60 tablet 11   Sennosides (SENNA) 8.6 MG CAPS Take 2 tablets by mouth daily. 60 capsule 2   SYNJARDY  XR 12-998 MG TB24 TAKE ONE TABLET BY MOUTH DAILY AT 9AM 60 tablet 11   Varenicline   Tartrate, Starter, 0.5 MG X 11 & 1 MG X 42 TBPK Take 0.5 mg daily for 3 days, then 0.5 mg twice daily for 4 days, then 1 mg twice daily thereafter as directed int he pack. Take with food. 53 each 0   No current facility-administered medications on file prior to visit.    Family History  Problem Relation Age of Onset   Hypertension Mother    Diabetes type II Mother    Heart attack Mother 5   CAD Father    Heart attack Father 4   Hypertension Brother    Diabetes type II Brother    Heart attack Brother 33   Diabetes type II Sister     Social History   Socioeconomic History   Marital status: Single    Spouse name: Not on file   Number of children: Not on file   Years of education: Not on file   Highest education level: Not on file  Occupational History   Not on file  Tobacco Use   Smoking status: Some Days    Current packs/day: 0.50    Average packs/day: 0.5 packs/day for 20.0 years (10.0 ttl pk-yrs)    Types: Cigarettes   Smokeless tobacco: Never   Tobacco comments:    SMOKES 4 CIGS PER DAY  Vaping Use   Vaping status: Never Used  Substance and Sexual Activity   Alcohol use: No    Alcohol/week: 0.0 standard drinks of alcohol   Drug use: Not Currently    Comment: hx heroin use, IV,  last used 2006 (currently on methadone )   Sexual activity: Not Currently  Other Topics Concern   Not on file  Social History Narrative   Not on file   Social Drivers of Health   Financial Resource Strain: Low Risk  (09/23/2022)   Overall Financial Resource Strain (CARDIA)    Difficulty of Paying Living Expenses: Not hard at all  Food Insecurity: No Food Insecurity (12/28/2023)   Hunger Vital Sign    Worried About Running Out of Food in the Last Year: Never true    Ran Out of Food in the Last Year: Never true  Transportation Needs: Unmet Transportation Needs (12/28/2023)   PRAPARE - Transportation    Lack of Transportation (Medical): Yes    Lack of Transportation (Non-Medical): Yes   Physical Activity: Insufficiently Active (12/28/2023)   Exercise Vital Sign    Days of Exercise per Week: 1 day    Minutes of Exercise per Session: 20 min  Stress: No Stress Concern Present (12/28/2023)   [employer] of Occupational Health - Occupational Stress Questionnaire    Feeling of Stress: Not at all  Social Connections: Moderately Integrated (  12/28/2023)   Social Connection and Isolation Panel    Frequency of Communication with Friends and Family: Three times a week    Frequency of Social Gatherings with Friends and Family: Twice a week    Attends Religious Services: 1 to 4 times per year    Active Member of [employer] or Organizations: No    Attends Engineer, Structural: 1 to 4 times per year    Marital Status: Never married  Intimate Partner Violence: Not At Risk (12/28/2023)   Humiliation, Afraid, Rape, and Kick questionnaire    Fear of Current or Ex-Partner: No    Emotionally Abused: No    Physically Abused: No    Sexually Abused: No    Review of Systems: ROS negative except for what is noted on the assessment and plan.  Vitals:   02/22/24 1324  BP: 137/61  Pulse: 80  Temp: 99.2 F (37.3 C)  TempSrc: Oral  SpO2: 98%  Weight: 143 lb 3.2 oz (65 kg)  Height: 5' 2.5 (1.588 m)    Physical Exam: Constitutional: well-appearing woman, sitting in chair , in no acute distress, more sleepy although I don't know what her baseline  HENT: normocephalic atraumatic, mucous membranes moist Eyes: conjunctiva non-erythematous Cardiovascular: regular rate and rhythm, no m/r/g Pulmonary/Chest: normal work of breathing on room air, lungs clear to auscultation bilaterally Abdominal: soft, non-tender, non-distended MSK: normal bulk and tone Neurological: alert & oriented x 3, no focal deficit Mental Status: Patient is awake, alert, oriented x3  No signs of aphasia or neglect Cranial Nerves: II: Pupils equal, round, and reactive to light.   III,IV, VI: EOMI without  ptosis or diploplia.  V: Facial sensation is symmetric to light touch and temperature. VII: Facial movement is symmetric.  VIII: Hearing is intact to voice X: Uvula elevates symmetrically XI: Shoulder shrug is symmetric. XII: Tongue is midline without atrophy or fasciculations.  Motor: 4/5 effort thorughout, at least 5/5 bilateral UE, 5/5 bilateral LE Sensory: Sensation is grossly intact  bilateral UE & LE Psych: normal mood and behavior  Assessment & Plan:   Fall Patient reports falling last Tuesday, striking her head and right shoulder; she did not call an ambulance at the time because she felt the injury was not serious, though she now reports 8/10 pain. Heating pad provides partial relief. On exam, she is alert, answering questions appropriately, with no confusion and no focal neurologic deficits. There are no visible signs of trauma to the head or either shoulder. Right shoulder shows full range of motion but pain with active movement, raising concern for a possible rotator cuff injury; no tenderness over the greater tuberosity. Left shoulder pain occurs with adduction. Given her head impact and current use of Plavix , she is at elevated risk for intracranial hemorrhage. I am sending her to the emergency department for a head CT to rule out bleeding and for shoulder X-rays as well as Cervical spine CT to evaluate for fracture or other injury. Plan: - Recommend immediate evaluation in the Emergency Department for: - Head CT to rule out intracranial hemorrhage. - X-ray of both shoulders to assess for fracture or dislocation. - CT cervical spine  - Continue supportive measures (heating pad, limited use), but emphasize urgency due to medication risk and mechanism of injury.   Addendum : I followed up with the charge nurse at [facility] who reported the patient left the ED due to long wait. Her friend who drove her to the clinic can't drive at night.  I called the patient back but couldn't get  through. I will follow up with Rose [name] in the morning.     Patient seen with Dr. Karna Drue Grow, M.D [facility] Phone: [phone] Date 02/22/2024 Time 5:23 PM

## 2024-02-22 NOTE — Patient Instructions (Addendum)
 Thank you, [name] for allowing us  to provide your care today. Today we discussed your fall.    I am sending you to the emergency room to get  CT head, Xray of shoulder, and CT cervical spine to further assess your recent fall  I have ordered the following labs for you:  Lab Orders  No laboratory test(s) ordered today     Tests ordered today:    Referrals ordered today:   Referral Orders  No referral(s) requested today     I have ordered the following medication/changed the following medications:   Stop the following medications: There are no discontinued medications.   Start the following medications: No orders of the defined types were placed in this encounter.    Follow up: 4 months   Remember:   Should you have any questions or concerns please call the internal medicine clinic at [phone].   Drue Lisa Grow MD 02/22/2024, 5:24 PM   [facility]

## 2024-02-22 NOTE — ED Triage Notes (Signed)
 Pt c/o L shoulder pain and posterior neck pain following a fall off the toilet x2 days ago.  Pt reports she fell asleep on the toilet.  Pt reports hitting her head on the tub when she fell.  Pt is A&Ox4.

## 2024-02-22 NOTE — Assessment & Plan Note (Addendum)
 Patient reports falling last Tuesday, striking her head and right shoulder; she did not call an ambulance at the time because she felt the injury was not serious, though she now reports 8/10 pain. Heating pad provides partial relief. On exam, she is alert, answering questions appropriately, with no confusion and no focal neurologic deficits. There are no visible signs of trauma to the head or either shoulder. Right shoulder shows full range of motion but pain with active movement, raising concern for a possible rotator cuff injury; no tenderness over the greater tuberosity. Left shoulder pain occurs with adduction, though the fall was on the right side. Given her head impact and current use of Plavix , she is at elevated risk for intracranial hemorrhage. I am sending her to the emergency department for a head CT to rule out bleeding and for shoulder X-rays to evaluate for fracture or other injury. Plan: Recommend immediate evaluation in the Emergency Department for: Head CT to rule out intracranial hemorrhage. X-ray of both shoulders to assess for fracture or dislocation. CT cervical spine  Continue supportive measures (heating pad, limited use), but emphasize urgency due to medication risk and mechanism of injury.

## 2024-02-29 ENCOUNTER — Telehealth: Payer: Self-pay | Admitting: Student

## 2024-02-29 NOTE — Telephone Encounter (Signed)
 Rose [name], who is on Plavix , presented to me last week following a fall. I was concerned about a potential intracranial bleed and referred Rose to the ED for a head CT without contrast. However, she was unable to wait for the scan due to a long wait time and the fact that Rose ride had to leave. I contacted the charge nurse at the time, who reassured me that Rose [name] had promised to return the following day for the head CT. As of today, she has not been able to return. I attempted to reach both Rose [name] and Rose [name], but the calls went straight to voicemail.  If Rose [name] calls back, please advise Rose that she needs to be seen in the clinic, preferably with me, within the next week for further assessment. The head CT is still recommended, as a potential head bleed, if present, can develop very slowly, and it's crucial to rule that out.  Drue Lisa Grow MD 02/29/2024, 10:38 AM

## 2024-02-29 NOTE — Progress Notes (Signed)
 Internal Medicine Clinic Attending  I was physically present during the key portions of the resident provided service and participated in the medical decision making of patient's management care. I reviewed pertinent patient test results.  The assessment, diagnosis, and plan were formulated together and I agree with the documentation in the resident's note.  Unwitnessed fall at home resulting in head strike. Patient reports falling asleep on the toilet resulting in fall. She denies LOC following head injury. Normal neurologic exam in clinic. Having left shoulder pain with reduced ROM and abduction with midline C-spine tenderness. Given age and Plavix  use, we recommended imaging evaluation of head, C-spine, and left shoulder. Patient went to the ED but left before being seen. Plan for clinic f/u this week for re-evaluation, discuss symptoms and if imaging remains indicated.  Karna Fellows, MD

## 2024-02-29 NOTE — Telephone Encounter (Signed)
 I was able to reach her daughter at 11:09 a.m. She stated that she was at work but would try to contact Rose [name] and have her schedule an appointment to come to the clinic on Friday. We also discussed the need for the head scan.  Rose Lisa Grow MD 02/29/2024, 11:12 AM

## 2024-03-02 ENCOUNTER — Encounter (HOSPITAL_COMMUNITY): Payer: Self-pay

## 2024-03-02 ENCOUNTER — Emergency Department (HOSPITAL_COMMUNITY)
Admission: EM | Admit: 2024-03-02 | Discharge: 2024-03-02 | Disposition: A | Discharge status: Home / Self Care | Source: Ambulatory Visit | Attending: Emergency Medicine | Admitting: Emergency Medicine

## 2024-03-02 ENCOUNTER — Emergency Department (HOSPITAL_COMMUNITY)

## 2024-03-02 ENCOUNTER — Other Ambulatory Visit: Payer: Self-pay

## 2024-03-02 DIAGNOSIS — Z7982 Long term (current) use of aspirin: Secondary | ICD-10-CM | POA: Insufficient documentation

## 2024-03-02 DIAGNOSIS — Z7902 Long term (current) use of antithrombotics/antiplatelets: Secondary | ICD-10-CM | POA: Diagnosis not present

## 2024-03-02 DIAGNOSIS — S0990XA Unspecified injury of head, initial encounter: Secondary | ICD-10-CM | POA: Diagnosis present

## 2024-03-02 DIAGNOSIS — Z79899 Other long term (current) drug therapy: Secondary | ICD-10-CM | POA: Insufficient documentation

## 2024-03-02 DIAGNOSIS — W01198A Fall on same level from slipping, tripping and stumbling with subsequent striking against other object, initial encounter: Secondary | ICD-10-CM | POA: Diagnosis not present

## 2024-03-02 NOTE — ED Provider Notes (Signed)
 [place] [facility]GENCY DEPARTMENT AT [facility] Provider Note   CSN: [id number] Arrival date & time: 03/02/24  0840     Patient presents with: Fall and Head Injury   [facility] Rose [name] is a 68 y.o. female.   68 year old female with prior medical history as detailed below presents at the request of her physician.  She reports that she hit her head on 22 November.  She takes both aspirin  and Plavix .  Her physician advised her to get a CT scan of her head to rule out intracranial injury.  She is without symptoms.  She denies headache or neck pain.  She denies weakness.  She has been ambulatory and functional at her baseline without issue since the fall that occurred on November 22.  She reports that she is trying to do what my doctor told me to do.  The history is provided by the patient and medical records.       Prior to Admission medications   Medication Sig Start Date End Date Taking? Authorizing Provider  albuterol  (VENTOLIN  HFA) 108 (90 Base) MCG/ACT inhaler Inhale 2 puffs into the lungs every 6 (six) hours as needed. For wheezing 08/10/23   Tobie Gaines, DO  atorvastatin  (LIPITOR ) 80 MG tablet Take 1 tablet (80 mg total) by mouth daily. 10/07/23 10/06/24  Amoako, Prince, MD  carvedilol  (COREG ) 6.25 MG tablet Take 1 tablet (6.25 mg total) by mouth 2 (two) times daily. 09/29/23 09/28/24  Amoako, Prince, MD  cetirizine  (ZYRTEC ) 10 MG tablet Take 1 tablet (10 mg total) by mouth daily. 12/16/22   Darron Deatrice LABOR, MD  clopidogrel  (PLAVIX ) 75 MG tablet TAKE ONE TABLET (75MG ) BY MOUTH DAILY AT 9AM WITH BREAKFAST 11/16/23   Renne Homans, MD  DULoxetine  (CYMBALTA ) 60 MG capsule TAKE ONE CAPSULE (60MG  TOTAL) BY MOUTH DAILY AT 9AM 11/23/23   Renne Homans, MD  fluticasone  (FLONASE ) 50 MCG/ACT nasal spray SHAKE LIQUID AND INSTILL ONE SPRAY IN EACH NOSTRIL EVERY DAY Patient taking differently: Place 1 spray into both nostrils daily as needed for allergies. 12/10/22   Amoako, Prince,  MD  gabapentin  (NEURONTIN ) 100 MG capsule TAKE 1 CAPSULE(100 MG) BY MOUTH THREE TIMES DAILY 08/25/23   Amoako, Prince, MD  hydrochlorothiazide  (HYDRODIURIL ) 25 MG tablet Take 1 tablet (25 mg total) by mouth daily. 09/29/23   Amoako, Prince, MD  methadone  (DOLOPHINE ) 10 MG/5ML solution Take 116 mg by mouth daily.    [provider]  Multiple Vitamins-Minerals (CENTRUM WOMEN) TABS Take 1 tablet by mouth daily.    [provider]  neomycin -polymyxin-hydrocortisone (CORTISPORIN ) 3.5-10000-1 OTIC suspension Place 4 drops into the left ear 3 (three) times daily for 7 days. 02/03/23   Piontek, Rocky, MD  senna (GERI-KOT) 8.6 MG tablet TAKE TWO TABLETS BY MOUTH DAILY AT 9 am 06/01/23   Amoako, Prince, MD  Sennosides (SENNA) 8.6 MG CAPS Take 2 tablets by mouth daily. 03/25/23   Kandis Perkins, DO  SYNJARDY  XR 12-998 MG TB24 TAKE ONE TABLET BY MOUTH DAILY AT 9AM 11/16/23   Amoako, Prince, MD  Varenicline  Tartrate, Starter, 0.5 MG X 11 & 1 MG X 42 TBPK Take 0.5 mg daily for 3 days, then 0.5 mg twice daily for 4 days, then 1 mg twice daily thereafter as directed int he pack. Take with food. 02/04/24   Renne Homans, MD    Allergies: Lisinopril , Gabapentin , Vicodin [hydrocodone-acetaminophen ], and Hydrocodone-acetaminophen     Review of Systems  All other systems reviewed and are negative.   Updated  Vital Signs BP 134/69 (BP Location: Right Arm)   Pulse 79   Temp 98.4 F (36.9 C)   Resp 15   LMP 07/31/2011   SpO2 98%   Physical Exam Vitals and nursing note reviewed.  Constitutional:      General: She is not in acute distress.    Appearance: Normal appearance. She is well-developed.  HENT:     Head: Normocephalic and atraumatic.  Eyes:     Conjunctiva/sclera: Conjunctivae normal.     Pupils: Pupils are equal, round, and reactive to light.  Cardiovascular:     Rate and Rhythm: Normal rate and regular rhythm.     Heart sounds: Normal heart sounds.  Pulmonary:     Effort: Pulmonary  effort is normal. No respiratory distress.     Breath sounds: Normal breath sounds.  Abdominal:     General: There is no distension.     Palpations: Abdomen is soft.     Tenderness: There is no abdominal tenderness.  Musculoskeletal:        General: No deformity. Normal range of motion.     Cervical back: Normal range of motion and neck supple.  Skin:    General: Skin is warm and dry.  Neurological:     General: No focal deficit present.     Mental Status: She is alert and oriented to person, place, and time. Mental status is at baseline.     Cranial Nerves: No cranial nerve deficit.     Motor: No weakness.     (all labs ordered are listed, but only abnormal results are displayed) Labs Reviewed - No data to display  EKG: None  Radiology: No results found.   Procedures   Medications Ordered in the ED - No data to display                                  Medical Decision Making Patient presents after fall with head injury.  Patient was advised to obtain head CT given use of both aspirin  and Plavix .  Patient is asymptomatic.  She is neurologically intact.  CT imaging is without acute abnormality.  Patient is reassured by findings on workup.  She understands need for close outpatient follow-up.  Strict return precautions given and understood.  Amount and/or Complexity of Data Reviewed Radiology: ordered.        Final diagnoses:  Injury of head, initial encounter    ED Discharge Orders     None          Laurice Maude BROCKS, MD 03/02/24 [phone]

## 2024-03-02 NOTE — Discharge Instructions (Signed)
 Return for any problem.  As discussed, follow-up with your regular care provider - and orthopedics - if you feel that you require their evaluation.

## 2024-03-02 NOTE — ED Triage Notes (Signed)
 Pt. Stated, I was here for a follow up from a fall and hit my head and take Plavix  and a ASA and my Dr. Odette me to come here for a CT scan.

## 2024-03-04 ENCOUNTER — Ambulatory Visit: Admitting: Student

## 2024-03-07 ENCOUNTER — Ambulatory Visit: Admitting: Student

## 2024-03-10 ENCOUNTER — Ambulatory Visit: Admitting: Student

## 2024-03-15 ENCOUNTER — Ambulatory Visit: Attending: Cardiovascular Disease | Admitting: Cardiovascular Disease

## 2024-03-15 NOTE — Progress Notes (Deleted)
 Cardiology Office Note   Date:  03/15/2024   ID:  Rose [name], DOB 10-15-1955, MRN [id number]  PCP:  Rose Homans, MD  Cardiologist:   Deatrice Cage, MD   No chief complaint on file.      History of Present Illness: Rose [name] is a 68 y.o. female who is here today for follow-up visit regarding peripheral arterial disease and coronary artery disease.  The patient has multiple chronic medical conditions that include type 2 diabetes, essential hypertension, hyperlipidemia, remote IV drug use and tobacco use. She is status post bilateral common iliac artery stenting done in March 2021.  She had no significant infrainguinal disease at that time.  She had an ulceration on the right lateral foot at that time.   She was seen in September of 2021 for exertional chest pain.  She underwent a Lexiscan  Myoview  which showed evidence of anterior and anterolateral ischemia.  Cardiac catheterization in October showed severe one-vessel coronary artery disease with 90% heavily calcified stenosis in the mid LAD and moderate left circumflex disease.  Ejection fraction was normal with mildly elevated left ventricular end-diastolic pressure.  I performed successful intravascular lithotripsy and drug eluting stent placement to the mid LAD.    She was able to quit smoking at some point but she relapsed after the death of her sister.   She was seen in 2024 for a small ulceration at the bottom of the right foot after an injury in early 2024. I proceeded with angiography in March 2024 which showed patent common iliac artery stent with mild in-stent restenosis, mild nonobstructive disease affecting the right SFA and popliteal arteries with three-vessel runoff below the knee.  No revascularization was needed. The ulceration on the right foot healed completely.  However, she injured her right big toe and ultimately got infected.  She had toe amputation which was slow to heal but  ultimately healed completely.  She denies claudication.  She continues to smoke.  Past Medical History:  Diagnosis Date   ACE inhibitor-aggravated angioedema, initial encounter 04/26/2016   Allergic rhinitis    Anxiety    Arthritis    Calculus of gallbladder without cholecystitis without obstruction 10/28/2018   Chronic back pain    CKD (chronic kidney disease), stage III (HCC)    folllowed by pcp   Coronary artery disease    Depression    Diabetic neuropathy (HCC)    Edema of lower extremity 10/28/2018   Encounter for completion of form with patient 06/21/2019   Environmental allergies    Environmental allergies 12/28/2015   Fibromyalgia    Foot ulcer secondary to diabetes 06/21/2019   Frequency of urination    History of chronic bronchitis    History of drug abuse (HCC) states quit herion in 2006   History of hepatitis C last abd ultrasound in epic 11-10-2018;  last hepative panel in epic 11-30-2018 was normal   previously followed by [facility] (notes in care everywhere)-- dx 04/ 2015,  started harvonic treatment 07/ 2015   History of rib fracture 10/28/2018   HPV in female 06/11/2016   Hyperlipidemia associated with type 2 diabetes mellitus (HCC) 12/13/2015   Hypertension    followed by pcp  (nuclear stress test 01-15-2017 in epic,  showed low risk normal w/ nuclear ef 55%)   Laceration of right foot 03/04/2019   Lipoma of neck 07/16/2017   Seen by general surgery Dr Curvin May 16 - plan for removal of 2cm sebaceous  cyst.    Methadone  maintenance therapy patient    Mild asthma    followed by pcp   Nocturia    PAD (peripheral artery disease)    followed by cardiology, dr darron---  06-01-2019  s/p bilateral CIA angioplasty stenting   Recurrent falls 04/30/2016   Right foot ulcer (HCC)    Symptomatic anemia 11/15/2020   Tendonitis    bilateral wrist   Type 2 diabetes mellitus (HCC)    followed by pcp   (06-16-2019  per pt checks blood sugar every other  day in am,  fasting sugar 92-95)   Urge urinary incontinence     Past Surgical History:  Procedure Laterality Date   ABDOMINAL AORTOGRAM W/LOWER EXTREMITY Bilateral 06/01/2019   Procedure: ABDOMINAL AORTOGRAM W/LOWER EXTREMITY;  Surgeon: Darron Deatrice LABOR, MD;  Location: [facility];  Service: Cardiovascular;  Laterality: Bilateral;   ABDOMINAL AORTOGRAM W/LOWER EXTREMITY N/A 06/04/2022   Procedure: ABDOMINAL AORTOGRAM W/LOWER EXTREMITY;  Surgeon: Darron Deatrice LABOR, MD;  Location: [facility];  Service: Cardiovascular;  Laterality: N/A;   AMPUTATION TOE Right 03/23/2023   Procedure: AMPUTATION TOE;  Surgeon: Malvin Marsa FALCON, DPM;  Location: [facility];  Service: Orthopedics/Podiatry;  Laterality: Right;  Right great toe amputation   APPENDECTOMY  1972   BREAST BIOPSY Right 09/24/2022   MM RT BREAST BX W LOC DEV 1ST LESION IMAGE BX SPEC STEREO GUIDE 09/24/2022 [facility]   INTERSTIM IMPLANT PLACEMENT  10/23/2011   Procedure: RENNA IMPLANT FIRST STAGE;  Surgeon: Glendia LABOR Elizabeth, MD;  Location: [facility];  Service: Urology;  Laterality: N/A;   INTERSTIM IMPLANT PLACEMENT  10/23/2011   Procedure: RENNA IMPLANT SECOND STAGE;  Surgeon: Glendia LABOR Elizabeth, MD;  Location: [facility];  Service: Urology;  Laterality: N/A;   LEFT HEART CATH AND CORONARY ANGIOGRAPHY N/A 01/11/2020   Procedure: LEFT HEART CATH AND CORONARY ANGIOGRAPHY;  Surgeon: Darron Deatrice LABOR, MD;  Location: [facility];  Service: Cardiovascular;  Laterality: N/A;   PANACOS TISSUE GRAFT Right 06/24/2019   Procedure: Application of Skin Graft Substitute;  Surgeon: Gretel Ozell PARAS, DPM;  Location[facility];  Service: Podiatry;  Laterality: Right;   PERIPHERAL VASCULAR INTERVENTION Bilateral 06/01/2019   Procedure: PERIPHERAL VASCULAR INTERVENTION;  Surgeon: Darron Deatrice LABOR, MD;  Location: [facility];  Service: Cardiovascular;  Laterality:  Bilateral;   WOUND DEBRIDEMENT Right 06/24/2019   Procedure: DEBRIDEMENT WOUND;  Surgeon: Gretel Ozell PARAS, DPM;  Location: [facility];  Service: Podiatry;  Laterality: Right;   WOUND DEBRIDEMENT Right 08/24/2019   Procedure: DEBRIDEMENT RIGHT FOOT WOUND; APPLICATION OF SKIN GRAFT SUBSTITUTE;  Surgeon: Gretel Ozell PARAS, DPM;  Location: [facility];  Service: Podiatry;  Laterality: Right;     Current Outpatient Medications  Medication Sig Dispense Refill   albuterol  (VENTOLIN  HFA) 108 (90 Base) MCG/ACT inhaler Inhale 2 puffs into the lungs every 6 (six) hours as needed. For wheezing 1 each 2   atorvastatin  (LIPITOR ) 80 MG tablet Take 1 tablet (80 mg total) by mouth daily. 90 tablet 3   carvedilol  (COREG ) 6.25 MG tablet Take 1 tablet (6.25 mg total) by mouth 2 (two) times daily. 60 tablet 11   cetirizine  (ZYRTEC ) 10 MG tablet Take 1 tablet (10 mg total) by mouth daily. 30 tablet 0   clopidogrel  (PLAVIX ) 75 MG tablet TAKE ONE TABLET (75MG ) BY MOUTH DAILY AT 9AM WITH BREAKFAST 90 tablet 11   DULoxetine  (  CYMBALTA ) 60 MG capsule TAKE ONE CAPSULE (60MG  TOTAL) BY MOUTH DAILY AT 9AM 30 capsule 5   fluticasone  (FLONASE ) 50 MCG/ACT nasal spray SHAKE LIQUID AND INSTILL ONE SPRAY IN EACH NOSTRIL EVERY DAY (Patient taking differently: Place 1 spray into both nostrils daily as needed for allergies.) 16 g 4   gabapentin  (NEURONTIN ) 100 MG capsule TAKE 1 CAPSULE(100 MG) BY MOUTH THREE TIMES DAILY 270 capsule 0   hydrochlorothiazide  (HYDRODIURIL ) 25 MG tablet Take 1 tablet (25 mg total) by mouth daily. 60 tablet 3   methadone  (DOLOPHINE ) 10 MG/5ML solution Take 116 mg by mouth daily.     Multiple Vitamins-Minerals (CENTRUM WOMEN) TABS Take 1 tablet by mouth daily.     neomycin -polymyxin-hydrocortisone (CORTISPORIN ) 3.5-10000-1 OTIC suspension Place 4 drops into the left ear 3 (three) times daily for 7 days. 10 mL 0   senna (GERI-KOT) 8.6 MG tablet TAKE TWO TABLETS BY MOUTH DAILY AT 9  am 60 tablet 11   Sennosides (SENNA) 8.6 MG CAPS Take 2 tablets by mouth daily. 60 capsule 2   SYNJARDY  XR 12-998 MG TB24 TAKE ONE TABLET BY MOUTH DAILY AT 9AM 60 tablet 11   Varenicline  Tartrate, Starter, 0.5 MG X 11 & 1 MG X 42 TBPK Take 0.5 mg daily for 3 days, then 0.5 mg twice daily for 4 days, then 1 mg twice daily thereafter as directed int he pack. Take with food. 53 each 0   No current facility-administered medications for this visit.    Allergies:   Lisinopril , Gabapentin , Vicodin [hydrocodone-acetaminophen ], and Hydrocodone-acetaminophen     Social History:  The patient  reports that she has been smoking cigarettes. She has a 10 pack-year smoking history. She has never used smokeless tobacco. She reports that she does not currently use drugs. She reports that she does not drink alcohol.   Family History:  The patient's family history includes CAD in her father; Diabetes type II in her brother, mother, and sister; Heart attack (age of onset: 36) in her brother; Heart attack (age of onset: 54) in her father and mother; Hypertension in her brother and mother.    ROS:  Please see the history of present illness.   Otherwise, review of systems are positive for none.   All other systems are reviewed and negative.    PHYSICAL EXAM: VS:  LMP 07/31/2011  , BMI There is no height or weight on file to calculate BMI. GEN: Well nourished, well developed, in no acute distress  HEENT: normal  Neck: no JVD, carotid bruits, or masses Cardiac: RRR; no murmurs, rubs, or gallops, mild bilateral leg edema Respiratory:  clear to auscultation bilaterally, normal work of breathing GI: soft, nontender, nondistended, + BS MS: no deformity or atrophy  Skin: warm and dry, no rash Neuro:  Strength and sensation are intact Psych: euthymic mood, full affect    EKG:  EKG is not ordered today.   Recent Labs: 03/22/2023: ALT 10 03/23/2023: Magnesium 2.3 03/27/2023: Hemoglobin 8.6; Platelets  291 12/28/2023: BUN 11; Creatinine, Ser 1.41; Potassium 4.1; Sodium 139    Lipid Panel    Component Value Date/Time   CHOL 98 (L) 12/10/2021 1115   TRIG 160 (H) 12/10/2021 1115   HDL 29 (L) 12/10/2021 1115   CHOLHDL 3.4 12/10/2021 1115   LDLCALC 42 12/10/2021 1115      Wt Readings from Last 3 Encounters:  02/22/24 143 lb (64.9 kg)  02/22/24 143 lb 3.2 oz (65 kg)  12/28/23 132 lb 3.2 oz (60 kg)  12/19/2016    1:37 PM  PAD Screen  Previous PAD dx? No  Previous surgical procedure? No  Pain with walking? No  Feet/toe relief with dangling? Yes  Painful, non-healing ulcers? No  Extremities discolored? No      ASSESSMENT AND PLAN:  1.  Peripheral arterial disease: Status post  stent placement to bilateral common iliac arteries.  Most recent angiography in March, 2024 showed patent iliac stents with mild in-stent restenosis.  Continue medical therapy.  Continue clopidogrel  75 mg once daily. The right big toe amputation site has healed completely.  Her blood flow should be optimal for healing.  2. Coronary artery disease involving native coronary arteries without angina : She is doing well with no anginal symptoms.  Continue medical therapy.   3.  Tobacco use: I again discussed with her the importance of smoking cessation.  4.  Hyperlipidemia: Continue treatment with atorvastatin .  Most recent lipid profile showed an LDL of 42.  5.  Essential hypertension: Blood pressure is controlled.   Disposition:   Follow-up in 6 months.  Signed,  Deatrice Cage, MD  03/15/2024 9:32 AM    [facility]

## 2024-04-04 ENCOUNTER — Telehealth: Payer: Self-pay | Admitting: Cardiovascular Disease

## 2024-04-04 ENCOUNTER — Encounter: Payer: Self-pay | Admitting: Cardiovascular Disease

## 2024-04-04 NOTE — Telephone Encounter (Signed)
 Called patient 3 times from recall to schedule appointment with no response, sent recall expungement letter via MyChart, deleted recall
